# Patient Record
Sex: Male | Born: 1960 | Race: Black or African American | Hispanic: No | Marital: Single | Smoking: Former smoker
Health system: Southern US, Community
[De-identification: ages and names within clinical notes are randomized; demographics above are authoritative.]

## PROBLEM LIST (undated history)

## (undated) DIAGNOSIS — I2510 Atherosclerotic heart disease of native coronary artery without angina pectoris: Secondary | ICD-10-CM

## (undated) DIAGNOSIS — I639 Cerebral infarction, unspecified: Secondary | ICD-10-CM

## (undated) DIAGNOSIS — I10 Essential (primary) hypertension: Secondary | ICD-10-CM

## (undated) DIAGNOSIS — E119 Type 2 diabetes mellitus without complications: Secondary | ICD-10-CM

## (undated) DIAGNOSIS — I252 Old myocardial infarction: Secondary | ICD-10-CM

## (undated) DIAGNOSIS — I509 Heart failure, unspecified: Secondary | ICD-10-CM

## (undated) HISTORY — PX: OTHER SURGICAL HISTORY: SHX169

---
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
## 2018-02-06 ENCOUNTER — Emergency Department (HOSPITAL_COMMUNITY): Payer: Medicaid - Out of State

## 2018-02-06 ENCOUNTER — Emergency Department (HOSPITAL_COMMUNITY)
Admission: EM | Admit: 2018-02-06 | Discharge: 2018-02-06 | Disposition: A | Discharge status: Home / Self Care | Payer: Medicaid - Out of State | Attending: Emergency Medicine | Admitting: Emergency Medicine

## 2018-02-06 ENCOUNTER — Encounter (HOSPITAL_COMMUNITY): Payer: Self-pay | Admitting: Emergency Medicine

## 2018-02-06 DIAGNOSIS — I2510 Atherosclerotic heart disease of native coronary artery without angina pectoris: Secondary | ICD-10-CM | POA: Insufficient documentation

## 2018-02-06 DIAGNOSIS — I110 Hypertensive heart disease with heart failure: Secondary | ICD-10-CM | POA: Insufficient documentation

## 2018-02-06 DIAGNOSIS — Z9119 Patient's noncompliance with other medical treatment and regimen: Secondary | ICD-10-CM | POA: Insufficient documentation

## 2018-02-06 DIAGNOSIS — Z87891 Personal history of nicotine dependence: Secondary | ICD-10-CM | POA: Insufficient documentation

## 2018-02-06 DIAGNOSIS — I509 Heart failure, unspecified: Secondary | ICD-10-CM | POA: Insufficient documentation

## 2018-02-06 DIAGNOSIS — Z8673 Personal history of transient ischemic attack (TIA), and cerebral infarction without residual deficits: Secondary | ICD-10-CM | POA: Insufficient documentation

## 2018-02-06 DIAGNOSIS — R0789 Other chest pain: Secondary | ICD-10-CM | POA: Insufficient documentation

## 2018-02-06 DIAGNOSIS — Z760 Encounter for issue of repeat prescription: Secondary | ICD-10-CM | POA: Insufficient documentation

## 2018-02-06 DIAGNOSIS — I10 Essential (primary) hypertension: Secondary | ICD-10-CM

## 2018-02-06 HISTORY — DX: Essential (primary) hypertension: I10

## 2018-02-06 HISTORY — DX: Cerebral infarction, unspecified: I63.9

## 2018-02-06 HISTORY — DX: Heart failure, unspecified: I50.9

## 2018-02-06 HISTORY — DX: Type 2 diabetes mellitus without complications: E11.9

## 2018-02-06 HISTORY — DX: Atherosclerotic heart disease of native coronary artery without angina pectoris: I25.10

## 2018-02-06 LAB — CBC
HCT: 48.1 % (ref 39.0–52.0)
Hemoglobin: 15.4 g/dL (ref 13.0–17.0)
MCH: 27.9 pg (ref 26.0–34.0)
MCHC: 32 g/dL (ref 30.0–36.0)
MCV: 87.1 fL (ref 78.0–100.0)
PLATELETS: 335 10*3/uL (ref 150–400)
RBC: 5.52 MIL/uL (ref 4.22–5.81)
RDW: 13.6 % (ref 11.5–15.5)
WBC: 7 10*3/uL (ref 4.0–10.5)

## 2018-02-06 LAB — BASIC METABOLIC PANEL
Anion gap: 10 (ref 5–15)
BUN: 14 mg/dL (ref 6–20)
CALCIUM: 8.7 mg/dL — AB (ref 8.9–10.3)
CHLORIDE: 105 mmol/L (ref 98–111)
CO2: 27 mmol/L (ref 22–32)
CREATININE: 1.5 mg/dL — AB (ref 0.61–1.24)
GFR calc non Af Amer: 51 mL/min — ABNORMAL LOW (ref >60)
GFR, EST AFRICAN AMERICAN: 59 mL/min — AB (ref >60)
Glucose, Bld: 128 mg/dL — ABNORMAL HIGH (ref 70–99)
Potassium: 4.7 mmol/L (ref 3.5–5.1)
SODIUM: 142 mmol/L (ref 135–145)

## 2018-02-06 LAB — I-STAT TROPONIN, ED
TROPONIN I, POC: 0.01 ng/mL (ref 0.00–0.08)
Troponin i, poc: 0.01 ng/mL (ref 0.00–0.08)

## 2018-02-06 MED ORDER — METFORMIN HCL 500 MG PO TABS: 500.0000 mg | ORAL_TABLET | Freq: Two times a day (BID) | ORAL | 0 refills | Status: DC

## 2018-02-06 MED ORDER — SACUBITRIL-VALSARTAN 97-103 MG PO TABS: 1.0000 | ORAL_TABLET | Freq: Two times a day (BID) | ORAL | 0 refills | Status: DC

## 2018-02-06 MED ORDER — GABAPENTIN 100 MG PO CAPS: 100.0000 mg | ORAL_CAPSULE | Freq: Three times a day (TID) | ORAL | 0 refills | Status: DC

## 2018-02-06 MED ORDER — LOPERAMIDE HCL 2 MG PO CAPS: 2.0000 mg | ORAL_CAPSULE | ORAL | 0 refills | Status: DC | PRN

## 2018-02-06 MED ORDER — CARVEDILOL 25 MG PO TABS: 25.0000 mg | ORAL_TABLET | Freq: Two times a day (BID) | ORAL | 0 refills | Status: DC

## 2018-02-06 MED ORDER — ATORVASTATIN CALCIUM 40 MG PO TABS: 40.0000 mg | ORAL_TABLET | Freq: Every day | ORAL | 0 refills | Status: DC

## 2018-02-06 NOTE — ED Notes (Signed)
Pt transported to Xray. 

## 2018-02-06 NOTE — Discharge Instructions (Addendum)
Your evaluated in the emergency department for being out of your meds and having some chest pain.  You had blood work EKG chest x-ray that did not show an obvious cause of your pain.  We are prescribing you 1 month of your medications that you ran out of.  Will be important for you to establish a primary care doctor for further medications.  Please return if any worsening symptoms.

## 2018-02-06 NOTE — ED Triage Notes (Signed)
Pt recently moved to [place], has not had home meds refilled since moving here. No PCP here. Pt complains of being out of meds for HTN, CHF, DM, GERD. Complains of chest pressure. EMS gave 2 nitro. Initial BP 230/130. Recent BP 173/128, HR 110 sat 98% on room air. Pt informed EMS he had a stroke 2 months ago prior to moving to , weakness to right arm. Pt comes with complete med list.

## 2018-02-06 NOTE — ED Notes (Signed)
Gave pt taxi voucher to get home

## 2018-02-06 NOTE — ED Notes (Signed)
Gave pt two [place] sandwiches and diet sprite

## 2018-02-06 NOTE — ED Notes (Signed)
Pt took 324mg  asa at home prior to arrival

## 2018-02-06 NOTE — ED Provider Notes (Signed)
[facility] EMERGENCY DEPARTMENT Provider Note   CSN: [id number] Arrival date & time: 02/06/18  1133     History   Chief Complaint Chief Complaint  Patient presents with  . Medication Refill  . Chest Pain    HPI Thomas West is a 57 y.o. male.  He presents to the emergency department complaining of being out of his regular meds for the last 3 days.  He recently moved here from [facility] and has multiple medical problems.  Since being off his medicine he notices blood pressures been high and has been having intermittent chest pressure.  He does have a history of cardiomyopathy.  He is also had a stroke.  He received some nitro by EMS and currently denies any chest pain.  He also has a history of diabetes and his sugars been elevated.  The history is provided by the patient.  Medication Refill  Reason for request:  Medications ran out Medications taken before: yes - see home medications   Chest Pain   This is a recurrent problem. The current episode started 1 to 2 hours ago. The problem occurs constantly. The problem has been resolved. The pain is present in the substernal region. The pain is moderate. The quality of the pain is described as burning and pressure-like. The pain does not radiate. Pertinent negatives include no abdominal pain, no cough, no diaphoresis, no fever, no headaches, no hemoptysis, no leg pain, no nausea, no shortness of breath and no vomiting. Back pain: 1.    Past Medical History:  Diagnosis Date  . CHF (congestive heart failure) (HCC)   . Coronary artery disease   . Diabetes mellitus without complication (HCC)   . Hypertension   . Stroke [facility])     There are no active problems to display for this patient.   Past Surgical History:  Procedure Laterality Date  . leg surgery     "pins in left shin"        Home Medications    Prior to Admission medications   Not on File    Family History History reviewed. No pertinent  family history.  Social History Social History   Tobacco Use  . Smoking status: Former Smoker    Last attempt to quit: 10/06/2017    Years since quitting: 0.3  . Smokeless tobacco: Never Used  Substance Use Topics  . Alcohol use: Not Currently  . Drug use: Yes    Types: Marijuana     Allergies   Bee venom   Review of Systems Review of Systems  Constitutional: Negative for diaphoresis and fever.  HENT: Negative for sore throat.   Eyes: Negative for visual disturbance.  Respiratory: Negative for cough, hemoptysis and shortness of breath.   Cardiovascular: Positive for chest pain.  Gastrointestinal: Negative for abdominal pain, nausea and vomiting.  Genitourinary: Negative for dysuria.  Musculoskeletal: Negative for neck pain. Back pain: 1.  Skin: Negative for rash.  Neurological: Negative for headaches.     Physical Exam Updated Vital Signs BP (!) 194/141 (BP Location: Right Arm)   Pulse 95   Temp 98.4 F (36.9 C) (Oral)   Resp 19   Ht 5\' 9"  (1.753 m)   Wt 77.1 kg   SpO2 92%   BMI 25.10 kg/m   Physical Exam  Constitutional: He appears well-developed and well-nourished.  HENT:  Head: Normocephalic and atraumatic.  Eyes: Conjunctivae are normal.  Neck: Neck supple.  Cardiovascular: Normal rate, regular rhythm and normal  pulses.  No murmur heard. Pulmonary/Chest: Effort normal and breath sounds normal. No respiratory distress.  Abdominal: Soft. There is no tenderness.  Musculoskeletal: He exhibits no edema.       Right lower leg: He exhibits no tenderness and no edema.       Left lower leg: He exhibits no tenderness and no edema.  Neurological: He is alert.  Skin: Skin is warm and dry.  Psychiatric: He has a normal mood and affect.  Nursing note and vitals reviewed.    ED Treatments / Results  Labs (all labs ordered are listed, but only abnormal results are displayed) Labs Reviewed  BASIC METABOLIC PANEL - Abnormal; Notable for the following  components:      Result Value   Glucose, Bld 128 (*)    Creatinine, Ser 1.50 (*)    Calcium 8.7 (*)    GFR calc non Af Amer 51 (*)    GFR calc Af Amer 59 (*)    All other components within normal limits  CBC  I-STAT TROPONIN, ED  I-STAT TROPONIN, ED    EKG EKG Interpretation  Date/Time:  Monday February 06 2018 11:37:38 EDT Ventricular Rate:  93 PR Interval:    QRS Duration: 78 QT Interval:  359 QTC Calculation: 447 R Axis:   56 Text Interpretation:  Sinus rhythm Probable left atrial enlargement Probable left ventricular hypertrophy Abnormal T, consider ischemia, lateral leads no prior to compare with Confirmed by Meridee Score [phone]) on 02/06/2018 11:41:40 AM   Radiology Dg Chest 2 View  Result Date: 02/06/2018 CLINICAL DATA:  Chest pain. EXAM: CHEST - 2 VIEW COMPARISON:  None. FINDINGS: The heart size and mediastinal contours are within normal limits. Both lungs are clear. No pneumothorax or pleural effusion is noted. The visualized skeletal structures are unremarkable. IMPRESSION: No active cardiopulmonary disease. Electronically Signed   By: Lupita Raider, M.D.   On: 02/06/2018 12:35    Procedures Procedures (including critical care time)  Medications Ordered in ED Medications - No data to display   Initial Impression / Assessment and Plan / ED Course  I have reviewed the triage vital signs and the nursing notes.  Pertinent labs & imaging results that were available during my care of the patient were reviewed by me and considered in my medical decision making (see chart for details).  Clinical Course as of Feb 07 1403  Mon Feb 06, 2018  [address] 57 year old male with multiple medical problems now out of his medications and here with elevated blood pressure chest pain and elevated blood sugars.  Regarding some basic labs EKG chest x-ray.  Is pain-free now so I think he possibly can be discharged if we do not find any catastrophes in his blood work.   [MB]      Clinical Course User Index [MB] Terrilee Files, MD     Final Clinical Impressions(s) / ED Diagnoses   Final diagnoses:  Atypical chest pain  Essential hypertension    ED Discharge Orders         Ordered    atorvastatin (LIPITOR) 40 MG tablet  Daily at bedtime     02/06/18 1505    carvedilol (COREG) 25 MG tablet  Every 12 hours     02/06/18 1505    gabapentin (NEURONTIN) 100 MG capsule  3 times daily     02/06/18 1505    metFORMIN (GLUCOPHAGE) 500 MG tablet  2 times daily with meals     02/06/18 1505  sacubitril-valsartan (ENTRESTO) 97-103 MG  Every 12 hours     02/06/18 1505    loperamide (IMODIUM) 2 MG capsule  As needed     02/06/18 1538           Terrilee FilesButler, Karnisha Lefebre C, MD 02/07/18 [phone]

## 2019-03-05 ENCOUNTER — Other Ambulatory Visit: Payer: Self-pay

## 2019-03-05 ENCOUNTER — Ambulatory Visit (HOSPITAL_COMMUNITY)
Admission: EM | Admit: 2019-03-05 | Discharge: 2019-03-05 | Disposition: A | Discharge status: Home / Self Care | Payer: Medicare Other | Attending: Family Medicine | Admitting: Family Medicine

## 2019-03-05 ENCOUNTER — Encounter (HOSPITAL_COMMUNITY): Payer: Self-pay | Admitting: Emergency Medicine

## 2019-03-05 DIAGNOSIS — K219 Gastro-esophageal reflux disease without esophagitis: Secondary | ICD-10-CM

## 2019-03-05 DIAGNOSIS — N182 Chronic kidney disease, stage 2 (mild): Secondary | ICD-10-CM | POA: Diagnosis not present

## 2019-03-05 DIAGNOSIS — Z760 Encounter for issue of repeat prescription: Secondary | ICD-10-CM

## 2019-03-05 DIAGNOSIS — I252 Old myocardial infarction: Secondary | ICD-10-CM

## 2019-03-05 DIAGNOSIS — E785 Hyperlipidemia, unspecified: Secondary | ICD-10-CM

## 2019-03-05 DIAGNOSIS — I2510 Atherosclerotic heart disease of native coronary artery without angina pectoris: Secondary | ICD-10-CM

## 2019-03-05 DIAGNOSIS — E1140 Type 2 diabetes mellitus with diabetic neuropathy, unspecified: Secondary | ICD-10-CM

## 2019-03-05 DIAGNOSIS — I10 Essential (primary) hypertension: Secondary | ICD-10-CM

## 2019-03-05 HISTORY — DX: Old myocardial infarction: I25.2

## 2019-03-05 MED ORDER — CLOPIDOGREL BISULFATE 75 MG PO TABS: 75.0000 mg | ORAL_TABLET | Freq: Every day | ORAL | 0 refills | Status: DC

## 2019-03-05 MED ORDER — CARVEDILOL 25 MG PO TABS: 25.0000 mg | ORAL_TABLET | Freq: Two times a day (BID) | ORAL | 0 refills | Status: DC

## 2019-03-05 MED ORDER — ASPIRIN EC 81 MG PO TBEC: 81.0000 mg | DELAYED_RELEASE_TABLET | Freq: Every day | ORAL | 0 refills | Status: DC

## 2019-03-05 MED ORDER — ENTRESTO 24-26 MG PO TABS: 1.0000 | ORAL_TABLET | Freq: Two times a day (BID) | ORAL | 0 refills | Status: DC

## 2019-03-05 MED ORDER — GLIPIZIDE ER 10 MG PO TB24: 10.0000 mg | ORAL_TABLET | Freq: Every day | ORAL | 0 refills | Status: DC

## 2019-03-05 MED ORDER — HYDRALAZINE HCL 50 MG PO TABS: 50.0000 mg | ORAL_TABLET | Freq: Three times a day (TID) | ORAL | 0 refills | Status: DC

## 2019-03-05 MED ORDER — NITROGLYCERIN 0.3 MG SL SUBL: 0.3000 mg | SUBLINGUAL_TABLET | SUBLINGUAL | 0 refills | Status: DC | PRN

## 2019-03-05 MED ORDER — ATORVASTATIN CALCIUM 40 MG PO TABS: 40.0000 mg | ORAL_TABLET | Freq: Every day | ORAL | 0 refills | Status: DC

## 2019-03-05 MED ORDER — ISOSORBIDE MONONITRATE ER 60 MG PO TB24: 60.0000 mg | ORAL_TABLET | Freq: Every day | ORAL | 0 refills | Status: DC

## 2019-03-05 MED ORDER — METFORMIN HCL 500 MG PO TABS: 500.0000 mg | ORAL_TABLET | Freq: Two times a day (BID) | ORAL | 0 refills | Status: DC

## 2019-03-05 MED ORDER — METOPROLOL SUCCINATE ER 100 MG PO TB24: 100.0000 mg | ORAL_TABLET | Freq: Every day | ORAL | 0 refills | Status: DC

## 2019-03-05 MED ORDER — GABAPENTIN 100 MG PO CAPS: 100.0000 mg | ORAL_CAPSULE | Freq: Three times a day (TID) | ORAL | 0 refills | Status: DC

## 2019-03-05 NOTE — ED Provider Notes (Signed)
[facility]    CSN: [id number] Arrival date & time: 03/05/19  1550      History   Chief Complaint Chief Complaint  Patient presents with  . Medication Refill    HPI Thomas West is a 58 y.o. male.   HPI  Patient states that he moved to [place] a week and a half ago.  He needs refills of all of his medications.  He brings with him records from a rehabilitation stay after he fell down some stairs and broke his shoulder.  He was discharged in July.  Looks like his medicine should have lasted until 02/13/2019.  He states he is out of everything.  Blood pressure is high today.  He feels well except for shoulder pain.  He is specifically requesting a refill of his pain medication.  I informed him that I would not refill chronic Percocet.  Recommend he take Tylenol for pain.  I am not comfortable giving him anti-inflammatory medications because he has a history of kidney failure and I do not have any lab work  Past Medical History:  Diagnosis Date  . CHF (congestive heart failure) (HCC)   . Coronary artery disease   . Diabetes mellitus without complication (HCC)   . History of MI (myocardial infarction) 03/05/2019  . Hypertension   . Stroke [facility])     Patient Active Problem List   Diagnosis Date Noted  . CAD (coronary artery disease) 03/05/2019  . History of MI (myocardial infarction) 03/05/2019  . Type 2 diabetes mellitus with diabetic neuropathy, unspecified (HCC) 03/05/2019  . CKD (chronic kidney disease), stage II 03/05/2019  . HLD (hyperlipidemia) 03/05/2019  . GERD (gastroesophageal reflux disease) 03/05/2019  . Essential hypertension 03/05/2019    Past Surgical History:  Procedure Laterality Date  . leg surgery     "pins in left shin"       Home Medications    Prior to Admission medications   Medication Sig Start Date End Date Taking? Authorizing Provider  omeprazole (PRILOSEC) 20 MG capsule Take 20 mg by mouth daily.   Yes [provider]  oxyCODONE-acetaminophen (PERCOCET/ROXICET) 5-325 MG tablet Take by mouth 2 (two) times daily as needed for severe pain.   Yes [provider]  simethicone (MYLICON) 125 MG chewable tablet Chew 125 mg by mouth every 6 (six) hours as needed for flatulence.   Yes [provider]  aspirin EC 81 MG tablet Take 1 tablet (81 mg total) by mouth daily. 03/05/19   Eustace Moore, MD  atorvastatin (LIPITOR) 40 MG tablet Take 1 tablet (40 mg total) by mouth at bedtime. 03/05/19   Eustace Moore, MD  carvedilol (COREG) 25 MG tablet Take 1 tablet (25 mg total) by mouth every 12 (twelve) hours. 10am and 10pm. 03/05/19   Eustace Moore, MD  clopidogrel (PLAVIX) 75 MG tablet Take 1 tablet (75 mg total) by mouth daily. 03/05/19   Eustace Moore, MD  gabapentin (NEURONTIN) 100 MG capsule Take 1 capsule (100 mg total) by mouth 3 (three) times daily. 03/05/19   Eustace Moore, MD  glipiZIDE (GLUCOTROL XL) 10 MG 24 hr tablet Take 1 tablet (10 mg total) by mouth daily with breakfast. 03/05/19   Eustace Moore, MD  hydrALAZINE (APRESOLINE) 50 MG tablet Take 1 tablet (50 mg total) by mouth 3 (three) times daily. 03/05/19   Eustace Moore, MD  isosorbide mononitrate (IMDUR) 60 MG 24 hr tablet Take 1 tablet (60 mg total) by  mouth daily. 03/05/19   Raylene Everts, MD  loperamide (IMODIUM) 2 MG capsule Take 1 capsule (2 mg total) by mouth as needed for diarrhea or loose stools. 02/06/18   Hayden Rasmussen, MD  metFORMIN (GLUCOPHAGE) 500 MG tablet Take 1 tablet (500 mg total) by mouth 2 (two) times daily with a meal. 03/05/19   Raylene Everts, MD  metoprolol succinate (TOPROL-XL) 100 MG 24 hr tablet Take 1 tablet (100 mg total) by mouth daily. Take with or immediately following a meal. 03/05/19   Raylene Everts, MD  nitroGLYCERIN (NITROSTAT) 0.3 MG SL tablet Place 1 tablet (0.3 mg total) under the tongue every 5 (five) minutes as needed for chest pain. 03/05/19   Raylene Everts, MD  sacubitril-valsartan (ENTRESTO) 24-26 MG Take 1 tablet by mouth 2 (two) times daily. 03/05/19   Raylene Everts, MD    Family History History reviewed. No pertinent family history.  Social History Social History   Tobacco Use  . Smoking status: Former Smoker    Quit date: 10/06/2017    Years since quitting: 1.4  . Smokeless tobacco: Never Used  Substance Use Topics  . Alcohol use: Not Currently  . Drug use: Yes    Types: Marijuana     Allergies   Bee venom   Review of Systems Review of Systems  Constitutional: Negative for chills and fever.  HENT: Negative for ear pain and sore throat.   Eyes: Negative for pain and visual disturbance.  Respiratory: Negative for cough and shortness of breath.   Cardiovascular: Negative for chest pain and palpitations.  Gastrointestinal: Negative for abdominal pain and vomiting.  Genitourinary: Negative for dysuria and hematuria.  Musculoskeletal: Positive for arthralgias. Negative for back pain.  Skin: Negative for color change and rash.  Neurological: Negative for seizures and syncope.  All other systems reviewed and are negative.    Physical Exam Triage Vital Signs ED Triage Vitals [03/05/19 1639]  Enc Vitals Group     BP (!) 197/108     Pulse Rate 89     Resp 18     Temp 98.6 F (37 C)     Temp Source Oral     SpO2 95 %     Weight      Height      Head Circumference      Peak Flow      Pain Score 7     Pain Loc      Pain Edu?      Excl. in [place]?    No data found.  Updated Vital Signs BP (!) 197/108 (BP Location: Right Arm)   Pulse 89   Temp 98.6 F (37 C) (Oral)   Resp 18   SpO2 95%       Physical Exam Constitutional:      General: He is in acute distress.     Appearance: He is well-developed.     Comments: Holding right shoulder.  Rocking back and forth  HENT:     Head: Normocephalic and atraumatic.  Eyes:     Conjunctiva/sclera: Conjunctivae normal.     Pupils: Pupils are equal,  round, and reactive to light.  Neck:     Musculoskeletal: Normal range of motion.  Cardiovascular:     Rate and Rhythm: Normal rate and regular rhythm.     Heart sounds: Normal heart sounds.  Pulmonary:     Effort: Pulmonary effort is normal. No respiratory distress.  Breath sounds: Normal breath sounds. No rales.  Abdominal:     General: There is no distension.     Palpations: Abdomen is soft.  Musculoskeletal: Normal range of motion.  Skin:    General: Skin is warm and dry.  Neurological:     Mental Status: He is alert.  Psychiatric:        Mood and Affect: Mood normal.        Behavior: Behavior normal.      UC Treatments / Results  Labs (all labs ordered are listed, but only abnormal results are displayed) Labs Reviewed - No data to display  EKG   Radiology No results found.  Procedures Procedures (including critical care time)  Medications Ordered in UC Medications - No data to display  Initial Impression / Assessment and Plan / UC Course  I have reviewed the triage vital signs and the nursing notes.  Pertinent labs & imaging results that were available during my care of the patient were reviewed by me and considered in my medical decision making (see chart for details).     I explained to the patient with his multitude of medical problems it was really important that he go see a primary care doctor for additional care.  I only gave him 30 days of medicine.  Told him he needs to follow-up within the next 30 days.  He needs referrals for his pain in his shoulder and for cardiology. Final Clinical Impressions(s) / UC Diagnoses   Final diagnoses:  CKD (chronic kidney disease), stage II  Coronary artery disease involving native heart, angina presence unspecified, unspecified vessel or lesion type  Essential hypertension  Hyperlipidemia, unspecified hyperlipidemia type  Medication refill     Discharge Instructions     All of your necessary medicines  have been refilled We do not refill pain medication.  You must take extra strength Tylenol for your shoulder pain You need to see a primary care doctor soon as possible.  Call them tomorrow for an appointment within 30 days You only have 30 days of medication.  We do not do ongoing refills of chronic medications.  It is important that you see a primary care doctor. Your primary care doctor can refer you to an orthopedic for your shoulder pain and to a cardiologist for ongoing heart care Some of your medicines are over-the-counter.  You can get these without a prescription.  (Mylicon, Lomotil, omeprazole)   ED Prescriptions    Medication Sig Dispense Auth. Provider   aspirin EC 81 MG tablet Take 1 tablet (81 mg total) by mouth daily. 30 tablet Eustace Moore, MD   atorvastatin (LIPITOR) 40 MG tablet  (Status: Discontinued) Take 1 tablet (40 mg total) by mouth at bedtime. 30 tablet Eustace Moore, MD   carvedilol (COREG) 25 MG tablet  (Status: Discontinued) Take 1 tablet (25 mg total) by mouth every 12 (twelve) hours. 10am and 10pm. 60 tablet Eustace Moore, MD   clopidogrel (PLAVIX) 75 MG tablet Take 1 tablet (75 mg total) by mouth daily. 30 tablet Eustace Moore, MD   gabapentin (NEURONTIN) 100 MG capsule Take 1 capsule (100 mg total) by mouth 3 (three) times daily. 90 capsule Eustace Moore, MD   glipiZIDE (GLUCOTROL XL) 10 MG 24 hr tablet Take 1 tablet (10 mg total) by mouth daily with breakfast. 30 tablet Eustace Moore, MD   carvedilol (COREG) 25 MG tablet Take 1 tablet (25 mg total) by mouth every 12 (twelve)  hours. 10am and 10pm. 60 tablet Eustace MooreNelson, Teona Vargus Sue, MD   hydrALAZINE (APRESOLINE) 50 MG tablet Take 1 tablet (50 mg total) by mouth 3 (three) times daily. 90 tablet Eustace MooreNelson, Paola Aleshire Sue, MD   isosorbide mononitrate (IMDUR) 60 MG 24 hr tablet Take 1 tablet (60 mg total) by mouth daily. 30 tablet Eustace MooreNelson, Burna Atlas Sue, MD   metFORMIN (GLUCOPHAGE) 500 MG tablet Take 1  tablet (500 mg total) by mouth 2 (two) times daily with a meal. 60 tablet Eustace MooreNelson, Mettie Roylance Sue, MD   metoprolol succinate (TOPROL-XL) 100 MG 24 hr tablet Take 1 tablet (100 mg total) by mouth daily. Take with or immediately following a meal. 30 tablet Eustace MooreNelson, Jhamari Markowicz Sue, MD   nitroGLYCERIN (NITROSTAT) 0.3 MG SL tablet Place 1 tablet (0.3 mg total) under the tongue every 5 (five) minutes as needed for chest pain. 20 tablet Eustace MooreNelson, Junior Kenedy Sue, MD   sacubitril-valsartan (ENTRESTO) 24-26 MG Take 1 tablet by mouth 2 (two) times daily. 60 tablet Eustace MooreNelson, Toleen Lachapelle Sue, MD   atorvastatin (LIPITOR) 40 MG tablet Take 1 tablet (40 mg total) by mouth at bedtime. 30 tablet Eustace MooreNelson, Zeffie Bickert Sue, MD     PDMP not reviewed this encounter.   Eustace MooreNelson, Ashlyne Olenick Sue, MD 03/05/19 [phone]

## 2019-03-05 NOTE — Discharge Instructions (Signed)
All of your necessary medicines have been refilled We do not refill pain medication.  You must take extra strength Tylenol for your shoulder pain You need to see a primary care doctor soon as possible.  Call them tomorrow for an appointment within 30 days You only have 30 days of medication.  We do not do ongoing refills of chronic medications.  It is important that you see a primary care doctor. Your primary care doctor can refer you to an orthopedic for your shoulder pain and to a cardiologist for ongoing heart care Some of your medicines are over-the-counter.  You can get these without a prescription.  (Mylicon, Lomotil, omeprazole)

## 2019-03-05 NOTE — ED Triage Notes (Signed)
Pt here for refill on medications; pt sts moved here from [place] recently and has been out of all meds x 1 week

## 2019-03-27 ENCOUNTER — Ambulatory Visit (INDEPENDENT_AMBULATORY_CARE_PROVIDER_SITE_OTHER): Payer: Medicare Other | Admitting: Family Medicine

## 2019-03-27 ENCOUNTER — Encounter: Payer: Self-pay | Admitting: Family Medicine

## 2019-03-27 ENCOUNTER — Other Ambulatory Visit: Payer: Self-pay

## 2019-03-27 VITALS — BP 150/80 | HR 85 | Temp 97.8°F | Resp 16 | Ht 69.0 in | Wt 175.8 lb

## 2019-03-27 DIAGNOSIS — E785 Hyperlipidemia, unspecified: Secondary | ICD-10-CM

## 2019-03-27 DIAGNOSIS — E1169 Type 2 diabetes mellitus with other specified complication: Secondary | ICD-10-CM | POA: Insufficient documentation

## 2019-03-27 DIAGNOSIS — I2510 Atherosclerotic heart disease of native coronary artery without angina pectoris: Secondary | ICD-10-CM | POA: Diagnosis not present

## 2019-03-27 DIAGNOSIS — I10 Essential (primary) hypertension: Secondary | ICD-10-CM

## 2019-03-27 DIAGNOSIS — M25511 Pain in right shoulder: Secondary | ICD-10-CM | POA: Diagnosis not present

## 2019-03-27 DIAGNOSIS — E119 Type 2 diabetes mellitus without complications: Secondary | ICD-10-CM | POA: Insufficient documentation

## 2019-03-27 MED ORDER — METOPROLOL SUCCINATE ER 100 MG PO TB24: 100.0000 mg | ORAL_TABLET | Freq: Every day | ORAL | 0 refills | Status: DC

## 2019-03-27 MED ORDER — ISOSORBIDE MONONITRATE ER 60 MG PO TB24: 60.0000 mg | ORAL_TABLET | Freq: Every day | ORAL | 2 refills | Status: DC

## 2019-03-27 MED ORDER — ATORVASTATIN CALCIUM 40 MG PO TABS: 40.0000 mg | ORAL_TABLET | Freq: Every day | ORAL | 2 refills | Status: DC

## 2019-03-27 MED ORDER — CARVEDILOL 25 MG PO TABS: 25.0000 mg | ORAL_TABLET | Freq: Two times a day (BID) | ORAL | 2 refills | Status: DC

## 2019-03-27 MED ORDER — METOPROLOL SUCCINATE ER 100 MG PO TB24: 100.0000 mg | ORAL_TABLET | Freq: Every day | ORAL | 2 refills | Status: DC

## 2019-03-27 MED ORDER — CLOPIDOGREL BISULFATE 75 MG PO TABS: 75.0000 mg | ORAL_TABLET | Freq: Every day | ORAL | 2 refills | Status: DC

## 2019-03-27 MED ORDER — HYDRALAZINE HCL 50 MG PO TABS: 50.0000 mg | ORAL_TABLET | Freq: Three times a day (TID) | ORAL | 2 refills | Status: DC

## 2019-03-27 NOTE — Patient Instructions (Signed)
A few things to remember from today's visit:   Type 2 diabetes mellitus with other specified complication, without long-term current use of insulin ([place]) - Plan: Hemoglobin A1c, Fructosamine, Microalbumin / creatinine urine ratio  Hypertension, essential, benign - Plan: Comprehensive metabolic panel  Coronary artery disease involving native coronary artery of native heart, angina presence unspecified  I am not prescribing Percocet. If you decide to go to ortho please let me know. Continue monitoring blood pressure, it is supposed to be at least under 140/90.   This is a list of pain clinics/chronic pain management in the area.  -[facility] [phone].  -[facility] [phone].  -[facility] [place] [facility] [phone].  -[facility] [phone]  -[facility]  [phone]  Dr. Alysia Penna [phone] [place], MD [phone]  -[facility] [place], MD [place] [phone]  Suella Broad [phone].

## 2019-03-27 NOTE — Assessment & Plan Note (Signed)
Today he is asymptomatic. Continue Plavix 75 mg daily and Aspirin 81 mg. Taking atorvastatin 40 mg, carvedilol 25 mg twice daily, and Imdur 60 mg daily. Strongly recommend establishing with cardiologist in the area, he is not interested in referral.

## 2019-03-27 NOTE — Assessment & Plan Note (Signed)
Continue atorvastatin 40 mg daily. Recommend having lipid panel recheck during next appointment, today he is not fasting.

## 2019-03-27 NOTE — Assessment & Plan Note (Signed)
Today A1c was ordered. According to patient probably has been well controlled and medications were discontinued. We will hold on refilling medications until lab result is back. He has an appointment with dentist and eye care provider next week. Recommend continuing dietary recommendations.

## 2019-03-27 NOTE — Assessment & Plan Note (Signed)
Problem is poorly controlled. He has no interested in adjusting medication, he feels like BP today is adequate for him. Educated about possible complications of elevated BP. Continue monitoring BP. Low-salt diet recommended.

## 2019-03-27 NOTE — Progress Notes (Signed)
HPI:   Thomas West is a 58 y.o. male, who is here today to establish care.  [place]. Last preventive routine visit: Over a year ago.  Chronic medical problems: DM 2, hypertension,GERD, CAD,CVA,CHF, and former smoker.  BP is mildly elevated today. He is not interested in discussing hypertension, he states that BP today is "good for me" because it is usually in the 200s/100s. He checks BP regularly at home.  Currently he is on metoprolol succinate 100 mg daily, carvedilol 25 mg twice daily, hydralazine 50 mg 3 times daily, and Imdur 60 mg daily. He is on Plavix 75 mg daily and Aspirin 81 mg daily.  He denies unusual headache, chest pain, dyspnea, palpitations, focal neurologic deficit, or edema. Early 01/2019 he had coronary stent placement, later same month he underwent CABG.  Lab Results  Component Value Date   CREATININE 1.50 (H) 02/06/2018   BUN 14 02/06/2018   NA 142 02/06/2018   K 4.7 02/06/2018   CL 105 02/06/2018   CO2 27 02/06/2018     DM2, currently he is on Metformin 500 mg twice daily and Glucotrol XL 10 mg daily. According to patient, his last A1c was done over 6 months ago and he was recommended to continue nonpharmacologic treatment but he decided to continue medications. He is not checking BS at home. Denies abdominal pain, nausea,vomiting, polydipsia,polyuria, or polyphagia.  Hyperlipidemia: Currently he is on atorvastatin 40 mg daily. Tolerating medication well.  Concerns today: Requesting a refill for Percocet. He states that in 12/2018 he fell at home when he was chasing his cat, injured right shoulder pain. [place] to [place] shoulder [place], PT was recommended but he did not complete it because he relocated.   He states that acetaminophen does not help and he cannot take OTC NSAIDs because his heart condition.  Pain is severe, constant, interfering with  his sleep. Limitation of right shoulder range of motion. Exacerbated by lying on his right side and minimal movement. Alleviated by rest. He has not noted shoulder erythema or edema. Pain is not getting any better.  Review of Systems  Constitutional: Positive for fatigue. Negative for appetite change, chills, fever and unexpected weight change.  HENT: Negative for mouth sores, nosebleeds and sore throat.   Eyes: Negative for redness and visual disturbance.  Respiratory: Negative for cough and wheezing.   Gastrointestinal:       No changes in bowel habits.  Endocrine: Negative for cold intolerance and heat intolerance.  Genitourinary: Negative for decreased urine volume, dysuria and hematuria.  Musculoskeletal: Negative for gait problem.  Skin: Negative for rash and wound.  Allergic/Immunologic: Negative for environmental allergies.  Neurological: Negative for syncope, facial asymmetry and numbness.  Psychiatric/Behavioral: Negative for confusion. The patient is nervous/anxious.   Rest see pertinent positives and negatives per HPI.   Current Outpatient Medications on File Prior to Visit  Medication Sig Dispense Refill  . aspirin EC 81 MG tablet Take 1 tablet (81 mg total) by mouth daily. 30 tablet 0  . gabapentin (NEURONTIN) 100 MG capsule Take 1 capsule (100 mg total) by mouth 3 (three) times daily. 90 capsule 0  . glipiZIDE (GLUCOTROL XL) 10 MG 24 hr tablet Take 1 tablet (10 mg total) by mouth daily with breakfast. 30 tablet 0  . loperamide (IMODIUM) 2 MG capsule Take 1 capsule (2 mg total) by mouth  as needed for diarrhea or loose stools. 30 capsule 0  . metFORMIN (GLUCOPHAGE) 500 MG tablet Take 1 tablet (500 mg total) by mouth 2 (two) times daily with a meal. 60 tablet 0  . nitroGLYCERIN (NITROSTAT) 0.3 MG SL tablet Place 1 tablet (0.3 mg total) under the tongue every 5 (five) minutes as needed for chest pain. 20 tablet 0  . omeprazole (PRILOSEC) 20 MG capsule Take 20 mg by mouth  daily.    Marland Kitchen oxyCODONE-acetaminophen (PERCOCET/ROXICET) 5-325 MG tablet Take by mouth 2 (two) times daily as needed for severe pain.    . sacubitril-valsartan (ENTRESTO) 24-26 MG Take 1 tablet by mouth 2 (two) times daily. 60 tablet 0  . simethicone (MYLICON) 125 MG chewable tablet Chew 125 mg by mouth every 6 (six) hours as needed for flatulence.     No current facility-administered medications on file prior to visit.      Past Medical History:  Diagnosis Date  . CHF (congestive heart failure) (HCC)   . Coronary artery disease   . Diabetes mellitus without complication (HCC)   . History of MI (myocardial infarction) 03/05/2019  . Hypertension   . Stroke [facility])    Allergies  Allergen Reactions  . Bee Venom     swelling    History reviewed. No pertinent family history.  Social History   Socioeconomic History  . Marital status: Single    Spouse name: Not on file  . Number of children: Not on file  . Years of education: Not on file  . Highest education level: Not on file  Occupational History  . Not on file  Social Needs  . Financial resource strain: Not on file  . Food insecurity    Worry: Not on file    Inability: Not on file  . Transportation needs    Medical: Not on file    Non-medical: Not on file  Tobacco Use  . Smoking status: Former Smoker    Quit date: 10/06/2017    Years since quitting: 1.4  . Smokeless tobacco: Never Used  Substance and Sexual Activity  . Alcohol use: Not Currently  . Drug use: Yes    Types: Marijuana  . Sexual activity: Not on file  Lifestyle  . Physical activity    Days per week: Not on file    Minutes per session: Not on file  . Stress: Not on file  Relationships  . Social Musician on phone: Not on file    Gets together: Not on file    Attends religious service: Not on file    Active member of club or organization: Not on file    Attends meetings of clubs or organizations: Not on file    Relationship status: Not on  file  Other Topics Concern  . Not on file  Social History Narrative  . Not on file    Vitals:   03/27/19 1142  BP: (!) 150/80  Pulse: 85  Resp: 16  Temp: 97.8 F (36.6 C)  SpO2: 96%    Body mass index is 25.96 kg/m.  Physical Exam  Nursing note reviewed. Constitutional: He is oriented to person, place, and time. He appears well-developed and well-nourished. No distress.  HENT:  Head: Normocephalic and atraumatic.  Mouth/Throat: Oropharynx is clear and moist and mucous membranes are normal. Abnormal dentition (missing teeth.).  Eyes: Pupils are equal, round, and reactive to light. Conjunctivae are normal.  Cardiovascular: Normal rate and regular rhythm.  Murmur (soft  SEM RUSB) heard. Respiratory: Effort normal and breath sounds normal. No respiratory distress.  GI: Soft. He exhibits no mass. There is no hepatomegaly. There is no abdominal tenderness.  Musculoskeletal:        General: No edema.     Right shoulder: He exhibits decreased range of motion and tenderness. He exhibits no swelling.     Comments: Holding right shoulder during the whole visit. Pain with minimal movement and palpation.  Lymphadenopathy:    He has no cervical adenopathy.  Neurological: He is alert and oriented to person, place, and time. He has normal strength. No cranial nerve deficit. Gait normal.  Skin: Skin is warm. No rash noted. No erythema.  Psychiatric: His affect is blunt. He is aggressive.  Fairly groomed, poor eye contact.    ASSESSMENT AND PLAN:  Mr. Delance was seen today for establish care.  Diagnoses and all orders for this visit:  Orders Placed This Encounter  Procedures  . Comprehensive metabolic panel  . Hemoglobin A1c  . Fructosamine  . Microalbumin / creatinine urine ratio    Right shoulder pain, unspecified chronicity Persistent pain after injury in 12/2018, still having severe pain. Shoulder difficult to evaluate due to pain. When I recommend continue with  acetaminophen and topical diclofenac, he does not want to try other treatments "I want my Percocet." Very upset.  He tells me that Percocet was recommended by his former PCP and he will continue Percocet as recommended. Tried to educate patient about current guidelines in regard to chronic opioid use and pain management.  "I was told that PCP will prescribe Percocet", "I got friends that get Percocet from their doctor."  He is not interested in ortho evaluation. He wants to establish with a new PCP that prescribes Percocet. Explained that I am not sure who would feel comfortable doing so, so a list of chronic pain managers in this area was on AVS. Refused AVS , stated that he will not leave until he gets his Percocet. I called administrator to discuss pt's concerns.  Hyperlipidemia associated with type 2 diabetes mellitus (HCC) Continue atorvastatin 40 mg daily. Recommend having lipid panel recheck during next appointment, today he is not fasting.  Diabetes mellitus (HCC) Today A1c was ordered. According to patient probably has been well controlled and medications were discontinued. We will hold on refilling medications until lab result is back. He has an appointment with dentist and eye care provider next week. Recommend continuing dietary recommendations.   CAD (coronary artery disease) Today he is asymptomatic. Continue Plavix 75 mg daily and Aspirin 81 mg. Taking atorvastatin 40 mg, carvedilol 25 mg twice daily, and Imdur 60 mg daily. Strongly recommend establishing with cardiologist in the area, he is not interested in referral.  Hypertension, essential, benign Problem is poorly controlled. He has no interested in adjusting medication, he feels like BP today is adequate for him. Educated about possible complications of elevated BP. Continue monitoring BP. Low-salt diet recommended.   Return in about 3 months (around 06/27/2019) for new PCP.    Gahel Safley G. [name], MD   [facility]. [place] office.

## 2019-04-27 ENCOUNTER — Other Ambulatory Visit: Payer: Self-pay | Admitting: Family Medicine

## 2019-04-27 NOTE — Telephone Encounter (Signed)
Medication Refill - Medication: glipiZIDE (GLUCOTROL XL) 10 MG 24 hr tablet/metFORMIN (GLUCOPHAGE) 500 MG tablet    Has the patient contacted their pharmacy? Yes.   (Agent: If no, request that the patient contact the pharmacy for the refill.) (Agent: If yes, when and what did the pharmacy advise?)  Preferred Pharmacy (with phone number or street name): [place], [address] [phone] (Phone) [phone] (Fax)     Agent: Please be advised that RX refills may take up to 3 business days. We ask that you follow-up with your pharmacy.

## 2019-04-27 NOTE — Telephone Encounter (Signed)
Requested medication (s) are due for refill today: yes  Requested medication (s) are on the active medication list: yes  Last refill:  03/05/2019  Future visit scheduled: no  Notes to clinic:  Review for refill Last filled by different provider    Requested Prescriptions  Pending Prescriptions Disp Refills   glipiZIDE (GLUCOTROL XL) 10 MG 24 hr tablet 30 tablet 0    Sig: Take 1 tablet (10 mg total) by mouth daily with breakfast.     Endocrinology:  Diabetes - Sulfonylureas Failed - 04/27/2019 12:31 PM      Failed - HBA1C is between 0 and 7.9 and within 180 days    No results found for: HGBA1C       Passed - Valid encounter within last 6 months    Recent Outpatient Visits          1 month ago Type 2 diabetes mellitus with other specified complication, without long-term current use of insulin ([place])   [facility] [name], Malka So, MD              metFORMIN (GLUCOPHAGE) 500 MG tablet 60 tablet 0    Sig: Take 1 tablet (500 mg total) by mouth 2 (two) times daily with a meal.     Endocrinology:  Diabetes - Biguanides Failed - 04/27/2019 12:31 PM      Failed - Cr in normal range and within 360 days    Creatinine, Ser  Date Value Ref Range Status  02/06/2018 1.50 (H) 0.61 - 1.24 mg/dL Final         Failed - HBA1C is between 0 and 7.9 and within 180 days    No results found for: HGBA1C       Failed - eGFR in normal range and within 360 days    GFR calc Af Amer  Date Value Ref Range Status  02/06/2018 59 (L) >60 mL/min Final    Comment:    (NOTE) The eGFR has been calculated using the CKD EPI equation. This calculation has not been validated in all clinical situations. eGFR's persistently <60 mL/min signify possible Chronic Kidney Disease.    GFR calc non Af Amer  Date Value Ref Range Status  02/06/2018 51 (L) >60 mL/min Final         Passed - Valid encounter within last 6 months    Recent Outpatient Visits          1 month ago Type 2 diabetes  mellitus with other specified complication, without long-term current use of insulin ([place])   [facility] [name], Malka So, MD

## 2019-06-18 ENCOUNTER — Other Ambulatory Visit: Payer: Self-pay | Admitting: Family Medicine

## 2019-06-18 DIAGNOSIS — E785 Hyperlipidemia, unspecified: Secondary | ICD-10-CM

## 2019-06-18 DIAGNOSIS — I2510 Atherosclerotic heart disease of native coronary artery without angina pectoris: Secondary | ICD-10-CM

## 2019-06-18 DIAGNOSIS — I10 Essential (primary) hypertension: Secondary | ICD-10-CM

## 2019-06-18 DIAGNOSIS — E1169 Type 2 diabetes mellitus with other specified complication: Secondary | ICD-10-CM

## 2019-07-16 ENCOUNTER — Other Ambulatory Visit: Payer: Self-pay | Admitting: Family Medicine

## 2019-08-14 ENCOUNTER — Other Ambulatory Visit: Payer: Self-pay | Admitting: Family Medicine

## 2019-08-14 DIAGNOSIS — I10 Essential (primary) hypertension: Secondary | ICD-10-CM

## 2019-08-14 DIAGNOSIS — E1169 Type 2 diabetes mellitus with other specified complication: Secondary | ICD-10-CM

## 2019-08-14 DIAGNOSIS — I2510 Atherosclerotic heart disease of native coronary artery without angina pectoris: Secondary | ICD-10-CM

## 2019-09-12 ENCOUNTER — Other Ambulatory Visit: Payer: Self-pay | Admitting: Family Medicine

## 2019-09-12 DIAGNOSIS — E785 Hyperlipidemia, unspecified: Secondary | ICD-10-CM

## 2019-09-12 DIAGNOSIS — E1169 Type 2 diabetes mellitus with other specified complication: Secondary | ICD-10-CM

## 2019-09-12 DIAGNOSIS — I2510 Atherosclerotic heart disease of native coronary artery without angina pectoris: Secondary | ICD-10-CM

## 2019-09-12 DIAGNOSIS — I10 Essential (primary) hypertension: Secondary | ICD-10-CM

## 2020-06-24 ENCOUNTER — Telehealth: Payer: Self-pay | Admitting: Family Medicine

## 2020-06-24 NOTE — Telephone Encounter (Signed)
Tried calling to  schedule Medicare Annual Wellness Visit (AWV) either virtually or in office. No answer   Last AWV no information please schedule at anytime with [facility] Nurse Health Advisor 1 or 2   This should be a 45 minute visit. Pt also needs appointment with pcp  Last appointment 03/27/2019

## 2020-07-25 ENCOUNTER — Telehealth: Payer: Self-pay | Admitting: Family Medicine

## 2020-07-25 NOTE — Telephone Encounter (Signed)
Tried calling patient to  schedule Medicare Annual Wellness Visit (AWV) either virtually or in office.  No answer   AWVI  please schedule at anytime with [facility] Nurse Health Advisor 1 or 2   This should be a 45 minute visit. Patient also needs appointment with PCP last appointment 03/27/2019

## 2020-11-17 ENCOUNTER — Telehealth: Payer: Self-pay | Admitting: Family Medicine

## 2020-11-17 NOTE — Telephone Encounter (Signed)
Tried calling patient to schedule Medicare Annual Wellness Visit (AWV) either virtually or in office.   AWV-I per [place] 04/24/2019 please schedule at anytime with [facility] Nurse Health Advisor 1 or 2  Patient also needs appointment with pcp last appointment 03/27/2019  This should be a 45 minute visit.

## 2020-12-16 ENCOUNTER — Telehealth: Payer: Self-pay | Admitting: Family Medicine

## 2020-12-16 NOTE — Telephone Encounter (Signed)
Left message for patient to call back and schedule Medicare Annual Wellness Visit (AWV) either virtually or in office.   AWV-I per [place] 04/24/2019 please schedule at anytime with [facility] Nurse Health Advisor 1 or 2  Patient also needs appointment with PCP last appointment 03/27/2019   This should be a 45 minute visit.

## 2021-01-01 ENCOUNTER — Telehealth: Payer: Self-pay | Admitting: Family Medicine

## 2021-01-01 NOTE — Telephone Encounter (Signed)
Tried calling patient to schedule Medicare Annual Wellness Visit (AWV) either virtually or in office.   No answer   AWV-I per [place] 04/24/2019  please schedule at anytime with [facility] Nurse Health Advisor 1 or 2   This should be a 45 minute visit.

## 2021-01-02 ENCOUNTER — Ambulatory Visit
Admission: EM | Admit: 2021-01-02 | Discharge: 2021-01-02 | Disposition: A | Discharge status: Home / Self Care | Payer: Medicare Other | Attending: Emergency Medicine | Admitting: Emergency Medicine

## 2021-01-02 ENCOUNTER — Encounter: Payer: Self-pay | Admitting: Emergency Medicine

## 2021-01-02 ENCOUNTER — Other Ambulatory Visit: Payer: Self-pay

## 2021-01-02 DIAGNOSIS — E785 Hyperlipidemia, unspecified: Secondary | ICD-10-CM

## 2021-01-02 DIAGNOSIS — N182 Chronic kidney disease, stage 2 (mild): Secondary | ICD-10-CM

## 2021-01-02 DIAGNOSIS — I2510 Atherosclerotic heart disease of native coronary artery without angina pectoris: Secondary | ICD-10-CM

## 2021-01-02 DIAGNOSIS — E1169 Type 2 diabetes mellitus with other specified complication: Secondary | ICD-10-CM | POA: Diagnosis not present

## 2021-01-02 DIAGNOSIS — I10 Essential (primary) hypertension: Secondary | ICD-10-CM

## 2021-01-02 LAB — POCT URINALYSIS DIP (MANUAL ENTRY)
Bilirubin, UA: NEGATIVE
Glucose, UA: >=1000 mg/dL — AB
Ketones, POC UA: NEGATIVE mg/dL
Leukocytes, UA: NEGATIVE
Nitrite, UA: NEGATIVE
Protein Ur, POC: >=300 mg/dL — AB
Spec Grav, UA: 1.02 (ref 1.010–1.025)
Urobilinogen, UA: 0.2 E.U./dL
pH, UA: 5.5 (ref 5.0–8.0)

## 2021-01-02 LAB — POCT FASTING CBG KUC MANUAL ENTRY: POCT Glucose (KUC): 383 mg/dL — AB (ref 70–99)

## 2021-01-02 MED ORDER — GLIPIZIDE ER 10 MG PO TB24: 10.0000 mg | ORAL_TABLET | Freq: Every day | ORAL | 1 refills | Status: DC

## 2021-01-02 MED ORDER — POLYETHYLENE GLYCOL 3350 17 G PO PACK: 17.0000 g | PACK | Freq: Every day | ORAL | 0 refills | Status: DC

## 2021-01-02 MED ORDER — ENTRESTO 24-26 MG PO TABS: 1.0000 | ORAL_TABLET | Freq: Two times a day (BID) | ORAL | 0 refills | Status: DC

## 2021-01-02 MED ORDER — DOCUSATE SODIUM 100 MG PO CAPS: 100.0000 mg | ORAL_CAPSULE | Freq: Two times a day (BID) | ORAL | 0 refills | Status: DC

## 2021-01-02 MED ORDER — ATORVASTATIN CALCIUM 40 MG PO TABS: 40.0000 mg | ORAL_TABLET | Freq: Every day | ORAL | 0 refills | Status: DC

## 2021-01-02 MED ORDER — ISOSORBIDE MONONITRATE ER 60 MG PO TB24: 60.0000 mg | ORAL_TABLET | Freq: Every day | ORAL | 0 refills | Status: DC

## 2021-01-02 MED ORDER — CLOPIDOGREL BISULFATE 75 MG PO TABS: 75.0000 mg | ORAL_TABLET | Freq: Every day | ORAL | 0 refills | Status: DC

## 2021-01-02 MED ORDER — METFORMIN HCL 500 MG PO TABS: 500.0000 mg | ORAL_TABLET | Freq: Two times a day (BID) | ORAL | 1 refills | Status: DC

## 2021-01-02 MED ORDER — CARVEDILOL 25 MG PO TABS: ORAL_TABLET | ORAL | 0 refills | Status: DC

## 2021-01-02 MED ORDER — HYDRALAZINE HCL 50 MG PO TABS: 50.0000 mg | ORAL_TABLET | Freq: Three times a day (TID) | ORAL | 0 refills | Status: DC

## 2021-01-02 MED ORDER — GABAPENTIN 100 MG PO CAPS: 100.0000 mg | ORAL_CAPSULE | Freq: Three times a day (TID) | ORAL | 0 refills | Status: DC

## 2021-01-02 MED ORDER — ASPIRIN EC 81 MG PO TBEC: 81.0000 mg | DELAYED_RELEASE_TABLET | Freq: Every day | ORAL | 0 refills | Status: DC

## 2021-01-02 NOTE — ED Triage Notes (Signed)
Patient presents to [facility] for evaluation of abdominal pain, generalized weakness, malaise.    States this is how he feels when his blood sugar is high,.  CBG 382 at triage.  Also c/o constipation.

## 2021-01-02 NOTE — Discharge Instructions (Addendum)
Blood work pending-I will call you if anything is abnormal Restart taking your diabetes, blood pressure and heart medicines Monitor blood pressure at home Monitor blood sugar at home Please call primary care first thing Monday morning to set up follow-up appointment If your symptoms are worsening please go to emergency room  For your constipation use daily MiraLAX with Colace twice daily

## 2021-01-02 NOTE — ED Provider Notes (Signed)
[facility]    CSN: [id number] Arrival date & time: 01/02/21  1036      History   Chief Complaint Chief Complaint  Patient presents with   Weakness    HPI Thomas West is a 60 y.o. male history of hypertension, CAD, CHF, DM type II presenting today for evaluation of diabetes/medication refill.  Patient reports that he has been out of all of his medicines for many months.  He reports being trying to get set up with PCP, but on chart review it appears that his PCP has been attempting to reach out to him, reports that he does not pick up numbers he is unfamiliar with as he is concerned about scam calls.  He reports of recently he has had worsening abdominal discomfort, constipation, bilateral lower leg swelling.  He denies any nausea or vomiting.  Denies headache or vision changes.  Denies chest pain.  Last bowel movement was yesterday, but reports a lot of straining.  HPI  Past Medical History:  Diagnosis Date   CHF (congestive heart failure) (HCC)    Coronary artery disease    Diabetes mellitus without complication (HCC)    History of MI (myocardial infarction) 03/05/2019   Hypertension    Stroke [facility])     Patient Active Problem List   Diagnosis Date Noted   Diabetes mellitus (HCC) 03/27/2019   Hyperlipidemia associated with type 2 diabetes mellitus (HCC) 03/27/2019   CAD (coronary artery disease) 03/05/2019   History of MI (myocardial infarction) 03/05/2019   Type 2 diabetes mellitus with diabetic neuropathy, unspecified (HCC) 03/05/2019   CKD (chronic kidney disease), stage II 03/05/2019   HLD (hyperlipidemia) 03/05/2019   GERD (gastroesophageal reflux disease) 03/05/2019   Hypertension, essential, benign 03/05/2019    Past Surgical History:  Procedure Laterality Date   leg surgery     "pins in left shin"       Home Medications    Prior to Admission medications   Medication Sig Start Date End Date Taking? Authorizing Provider  docusate sodium  (COLACE) 100 MG capsule Take 1 capsule (100 mg total) by mouth every 12 (twelve) hours. 01/02/21  Yes Winnifred Dufford C, PA-C  polyethylene glycol (MIRALAX / GLYCOLAX) 17 g packet Take 17 g by mouth daily. 01/02/21  Yes Mykah Bellomo C, PA-C  aspirin EC 81 MG tablet Take 1 tablet (81 mg total) by mouth daily. 01/02/21   Chayim Bialas C, PA-C  atorvastatin (LIPITOR) 40 MG tablet Take 1 tablet (40 mg total) by mouth at bedtime. 01/02/21   Regina Coppolino C, PA-C  carvedilol (COREG) 25 MG tablet TAKE ONE TABLET BY MOUTH EVERY 12 HOURS (10AM &10PM) 01/02/21   Khaila Velarde C, PA-C  clopidogrel (PLAVIX) 75 MG tablet Take 1 tablet (75 mg total) by mouth daily. 01/02/21   Daquana Paddock C, PA-C  gabapentin (NEURONTIN) 100 MG capsule Take 1 capsule (100 mg total) by mouth 3 (three) times daily. 01/02/21   Ellan Tess C, PA-C  glipiZIDE (GLUCOTROL XL) 10 MG 24 hr tablet Take 1 tablet (10 mg total) by mouth daily with breakfast. 01/02/21   Zackry Deines C, PA-C  hydrALAZINE (APRESOLINE) 50 MG tablet Take 1 tablet (50 mg total) by mouth 3 (three) times daily. 01/02/21   Jaquila Santelli C, PA-C  isosorbide mononitrate (IMDUR) 60 MG 24 hr tablet Take 1 tablet (60 mg total) by mouth daily. 01/02/21   Arlo Buffone C, PA-C  loperamide (IMODIUM) 2 MG capsule Take 1 capsule (2 mg total)  by mouth as needed for diarrhea or loose stools. 02/06/18   Terrilee Files, MD  metFORMIN (GLUCOPHAGE) 500 MG tablet Take 1 tablet (500 mg total) by mouth 2 (two) times daily with a meal. 01/02/21   Amarie Viles C, PA-C  nitroGLYCERIN (NITROSTAT) 0.3 MG SL tablet Place 1 tablet (0.3 mg total) under the tongue every 5 (five) minutes as needed for chest pain. 03/05/19   Eustace Moore, MD  oxyCODONE-acetaminophen (PERCOCET/ROXICET) 5-325 MG tablet Take by mouth 2 (two) times daily as needed for severe pain.    [provider]  sacubitril-valsartan (ENTRESTO) 24-26 MG Take 1 tablet by mouth 2 (two) times daily.  01/02/21   Dolores Mcgovern C, PA-C  simethicone (MYLICON) 125 MG chewable tablet Chew 125 mg by mouth every 6 (six) hours as needed for flatulence.    [provider]    Family History History reviewed. No pertinent family history.  Social History Social History   Tobacco Use   Smoking status: Former    Types: Cigarettes    Quit date: 10/06/2017    Years since quitting: 3.2   Smokeless tobacco: Never  Substance Use Topics   Alcohol use: Not Currently   Drug use: Yes    Types: Marijuana     Allergies   Bee venom   Review of Systems Review of Systems  Constitutional:  Negative for fatigue and fever.  HENT:  Negative for congestion, sinus pressure and sore throat.   Eyes:  Negative for photophobia, pain and visual disturbance.  Respiratory:  Negative for cough and shortness of breath.   Cardiovascular:  Positive for leg swelling. Negative for chest pain.  Gastrointestinal:  Positive for abdominal pain and constipation. Negative for nausea and vomiting.  Genitourinary:  Negative for decreased urine volume and hematuria.  Musculoskeletal:  Negative for myalgias, neck pain and neck stiffness.  Neurological:  Negative for dizziness, syncope, facial asymmetry, speech difficulty, weakness, light-headedness, numbness and headaches.    Physical Exam Triage Vital Signs ED Triage Vitals  Enc Vitals Group     BP      Pulse      Resp      Temp      Temp src      SpO2      Weight      Height      Head Circumference      Peak Flow      Pain Score      Pain Loc      Pain Edu?      Excl. in GC?    No data found.  Updated Vital Signs BP (!) 180/129 (BP Location: Right Arm)   Pulse (!) 105   Temp 98.7 F (37.1 C) (Oral)   Resp 18   SpO2 98%   Visual Acuity Right Eye Distance:   Left Eye Distance:   Bilateral Distance:    Right Eye Near:   Left Eye Near:    Bilateral Near:     Physical Exam Vitals and nursing note reviewed.  Constitutional:       Appearance: He is well-developed.     Comments: No acute distress  HENT:     Head: Normocephalic and atraumatic.     Nose: Nose normal.  Eyes:     Extraocular Movements: Extraocular movements intact.     Conjunctiva/sclera: Conjunctivae normal.     Pupils: Pupils are equal, round, and reactive to light.  Cardiovascular:     Rate and Rhythm:  Regular rhythm. Tachycardia present.  Pulmonary:     Effort: Pulmonary effort is normal. No respiratory distress.     Comments: Breathing comfortably at rest, CTABL, no wheezing, rales or other adventitious sounds auscultated   Abdominal:     General: There is no distension.  Musculoskeletal:        General: Normal range of motion.     Cervical back: Neck supple.  Skin:    General: Skin is warm and dry.  Neurological:     Mental Status: He is alert and oriented to person, place, and time.     UC Treatments / Results  Labs (all labs ordered are listed, but only abnormal results are displayed) Labs Reviewed  COMPREHENSIVE METABOLIC PANEL - Abnormal; Notable for the following components:      Result Value   Glucose 417 (*)    Creatinine, Ser 1.38 (*)    Potassium 5.3 (*)    Chloride 95 (*)    Albumin 3.4 (*)    Alkaline Phosphatase 210 (*)    All other components within normal limits   Narrative:    Performed at:  [address] - [facility] [address], [place], [place]  [id number] Lab Director: Jolene SchimkeSanjai Nagendra MD, Phone:  [phone]  POCT FASTING CBG K[facility] MANUAL ENTRY - Abnormal; Notable for the following components:   POCT Glucose ([facility]) 383 (*)    All other components within normal limits  POCT URINALYSIS DIP (MANUAL ENTRY) - Abnormal; Notable for the following components:   Glucose, UA >=1,000 (*)    Blood, UA trace-lysed (*)    Protein Ur, POC >=300 (*)    All other components within normal limits  CBC   Narrative:    Performed at:  [address] [employer]- [facility] [address], [place], [place]  [id number] Lab Director: Jolene SchimkeSanjai  Nagendra MD, Phone:  [phone]    EKG   Radiology No results found.  Procedures Procedures (including critical care time)  Medications Ordered in UC Medications - No data to display  Initial Impression / Assessment and Plan / UC Course  I have reviewed the triage vital signs and the nursing notes.  Pertinent labs & imaging results that were available during my care of the patient were reviewed by me and considered in my medical decision making (see chart for details).     Hypertension/CHF-blood pressure elevated today, recheck similarly elevated; edema to lower legs-refilled hydralazine, carvedilol, Entresto, Imdur, aspirin and Plavix-checking CBC, CMP DM type II-blood sugar 383 today, no ketones in urine, refilling metformin, glipizide Hyperlipidemia-refilled Lipitor Constipation-recommended MiraLAX and Colace, but advised to go to emergency room if developing worsening abdominal pain, bowel/gas  Discussed with patient that he is borderline needing to be evaluated in the emergency room with his blood pressure causing hypertensive emergency, turning into DKA for heart failure stressed importance of following up with primary care for regular monitoring and management of his chronic conditions and medicines.  Discussed strict return precautions. Patient verbalized understanding and is agreeable with plan.  Final Clinical Impressions(s) / UC Diagnoses   Final diagnoses:  Hypertension, essential, benign  Type 2 diabetes mellitus with other specified complication, without long-term current use of insulin (HCC)  CKD (chronic kidney disease), stage II  Hyperlipidemia associated with type 2 diabetes mellitus (HCC)  Coronary artery disease involving native coronary artery of native heart, unspecified whether angina present     Discharge Instructions      Blood work pending-I will call you if anything is abnormal Restart taking  your diabetes, blood pressure and heart  medicines Monitor blood pressure at home Monitor blood sugar at home Please call primary care first thing Monday morning to set up follow-up appointment If your symptoms are worsening please go to emergency room  For your constipation use daily MiraLAX with Colace twice daily      ED Prescriptions     Medication Sig Dispense Auth. Provider   aspirin EC 81 MG tablet Take 1 tablet (81 mg total) by mouth daily. 30 tablet Chae Oommen C, PA-C   atorvastatin (LIPITOR) 40 MG tablet Take 1 tablet (40 mg total) by mouth at bedtime. 30 tablet Oneal Biglow C, PA-C   gabapentin (NEURONTIN) 100 MG capsule Take 1 capsule (100 mg total) by mouth 3 (three) times daily. 90 capsule Lannie Heaps C, PA-C   clopidogrel (PLAVIX) 75 MG tablet Take 1 tablet (75 mg total) by mouth daily. 30 tablet Jlee Harkless C, PA-C   carvedilol (COREG) 25 MG tablet TAKE ONE TABLET BY MOUTH EVERY 12 HOURS (10AM &10PM) 60 tablet Teal Raben C, PA-C   glipiZIDE (GLUCOTROL XL) 10 MG 24 hr tablet Take 1 tablet (10 mg total) by mouth daily with breakfast. 30 tablet Tamarah Bhullar C, PA-C   hydrALAZINE (APRESOLINE) 50 MG tablet Take 1 tablet (50 mg total) by mouth 3 (three) times daily. 90 tablet Izaiyah Kleinman C, PA-C   metFORMIN (GLUCOPHAGE) 500 MG tablet Take 1 tablet (500 mg total) by mouth 2 (two) times daily with a meal. 60 tablet Demarion Pondexter C, PA-C   sacubitril-valsartan (ENTRESTO) 24-26 MG Take 1 tablet by mouth 2 (two) times daily. 60 tablet Ramiz Turpin C, PA-C   isosorbide mononitrate (IMDUR) 60 MG 24 hr tablet Take 1 tablet (60 mg total) by mouth daily. 30 tablet Mehar Kirkwood C, PA-C   polyethylene glycol (MIRALAX / GLYCOLAX) 17 g packet Take 17 g by mouth daily. 14 each Katesha Eichel C, PA-C   docusate sodium (COLACE) 100 MG capsule Take 1 capsule (100 mg total) by mouth every 12 (twelve) hours. 60 capsule Neidy Guerrieri, [place] C, PA-C      PDMP not reviewed this encounter.   Lew Dawes, PA-C 01/03/21 1054

## 2021-01-03 LAB — CBC
Hematocrit: 50 % (ref 37.5–51.0)
Hemoglobin: 16 g/dL (ref 13.0–17.7)
MCH: 28.3 pg (ref 26.6–33.0)
MCHC: 32 g/dL (ref 31.5–35.7)
MCV: 88 fL (ref 79–97)
Platelets: 266 10*3/uL (ref 150–450)
RBC: 5.66 x10E6/uL (ref 4.14–5.80)
RDW: 13.7 % (ref 11.6–15.4)
WBC: 8.1 10*3/uL (ref 3.4–10.8)

## 2021-01-03 LAB — COMPREHENSIVE METABOLIC PANEL
ALT: 43 IU/L (ref 0–44)
AST: 30 IU/L (ref 0–40)
Albumin/Globulin Ratio: 1.3 (ref 1.2–2.2)
Albumin: 3.4 g/dL — ABNORMAL LOW (ref 3.8–4.9)
Alkaline Phosphatase: 210 IU/L — ABNORMAL HIGH (ref 44–121)
BUN/Creatinine Ratio: 13 (ref 9–20)
BUN: 18 mg/dL (ref 6–24)
Bilirubin Total: 1.2 mg/dL (ref 0.0–1.2)
CO2: 25 mmol/L (ref 20–29)
Calcium: 9.1 mg/dL (ref 8.7–10.2)
Chloride: 95 mmol/L — ABNORMAL LOW (ref 96–106)
Creatinine, Ser: 1.38 mg/dL — ABNORMAL HIGH (ref 0.76–1.27)
Globulin, Total: 2.6 g/dL (ref 1.5–4.5)
Glucose: 417 mg/dL — ABNORMAL HIGH (ref 65–99)
Potassium: 5.3 mmol/L — ABNORMAL HIGH (ref 3.5–5.2)
Sodium: 135 mmol/L (ref 134–144)
Total Protein: 6 g/dL (ref 6.0–8.5)
eGFR: 60 mL/min/{1.73_m2} (ref >59)

## 2021-02-03 ENCOUNTER — Other Ambulatory Visit: Payer: Self-pay

## 2021-02-03 ENCOUNTER — Other Ambulatory Visit: Payer: Medicare Other

## 2021-02-03 ENCOUNTER — Encounter: Payer: Self-pay | Admitting: Family Medicine

## 2021-02-03 ENCOUNTER — Ambulatory Visit (INDEPENDENT_AMBULATORY_CARE_PROVIDER_SITE_OTHER): Payer: Medicare Other | Admitting: Family Medicine

## 2021-02-03 VITALS — BP 130/80 | HR 57 | Temp 98.2°F | Resp 16 | Ht 69.0 in | Wt 161.0 lb

## 2021-02-03 DIAGNOSIS — G63 Polyneuropathy in diseases classified elsewhere: Secondary | ICD-10-CM

## 2021-02-03 DIAGNOSIS — G629 Polyneuropathy, unspecified: Secondary | ICD-10-CM | POA: Insufficient documentation

## 2021-02-03 DIAGNOSIS — E1140 Type 2 diabetes mellitus with diabetic neuropathy, unspecified: Secondary | ICD-10-CM | POA: Diagnosis not present

## 2021-02-03 DIAGNOSIS — E785 Hyperlipidemia, unspecified: Secondary | ICD-10-CM

## 2021-02-03 DIAGNOSIS — R0989 Other specified symptoms and signs involving the circulatory and respiratory systems: Secondary | ICD-10-CM

## 2021-02-03 DIAGNOSIS — N182 Chronic kidney disease, stage 2 (mild): Secondary | ICD-10-CM

## 2021-02-03 DIAGNOSIS — K5900 Constipation, unspecified: Secondary | ICD-10-CM

## 2021-02-03 DIAGNOSIS — E1169 Type 2 diabetes mellitus with other specified complication: Secondary | ICD-10-CM | POA: Diagnosis not present

## 2021-02-03 DIAGNOSIS — I5020 Unspecified systolic (congestive) heart failure: Secondary | ICD-10-CM | POA: Diagnosis not present

## 2021-02-03 DIAGNOSIS — I10 Essential (primary) hypertension: Secondary | ICD-10-CM | POA: Diagnosis not present

## 2021-02-03 DIAGNOSIS — R748 Abnormal levels of other serum enzymes: Secondary | ICD-10-CM | POA: Diagnosis not present

## 2021-02-03 DIAGNOSIS — E1122 Type 2 diabetes mellitus with diabetic chronic kidney disease: Secondary | ICD-10-CM

## 2021-02-03 DIAGNOSIS — Z1159 Encounter for screening for other viral diseases: Secondary | ICD-10-CM

## 2021-02-03 DIAGNOSIS — I2510 Atherosclerotic heart disease of native coronary artery without angina pectoris: Secondary | ICD-10-CM

## 2021-02-03 LAB — LIPID PANEL
Cholesterol: 189 mg/dL (ref 0–200)
HDL: 44.6 mg/dL (ref >39.00)
LDL Cholesterol: 120 mg/dL — ABNORMAL HIGH (ref 0–99)
NonHDL: 144.29
Total CHOL/HDL Ratio: 4
Triglycerides: 123 mg/dL (ref 0.0–149.0)
VLDL: 24.6 mg/dL (ref 0.0–40.0)

## 2021-02-03 LAB — HEPATIC FUNCTION PANEL
ALT: 35 U/L (ref 0–53)
AST: 22 U/L (ref 0–37)
Albumin: 3.3 g/dL — ABNORMAL LOW (ref 3.5–5.2)
Alkaline Phosphatase: 227 U/L — ABNORMAL HIGH (ref 39–117)
Bilirubin, Direct: 0.3 mg/dL (ref 0.0–0.3)
Total Bilirubin: 1.2 mg/dL (ref 0.2–1.2)
Total Protein: 6.2 g/dL (ref 6.0–8.3)

## 2021-02-03 LAB — BRAIN NATRIURETIC PEPTIDE: Pro B Natriuretic peptide (BNP): 3012 pg/mL — ABNORMAL HIGH (ref 0.0–100.0)

## 2021-02-03 LAB — BASIC METABOLIC PANEL
BUN: 15 mg/dL (ref 6–23)
CO2: 31 mEq/L (ref 19–32)
Calcium: 9 mg/dL (ref 8.4–10.5)
Chloride: 96 mEq/L (ref 96–112)
Creatinine, Ser: 1.46 mg/dL (ref 0.40–1.50)
GFR: 52.93 mL/min — ABNORMAL LOW (ref >60.00)
Glucose, Bld: 415 mg/dL — ABNORMAL HIGH (ref 70–99)
Potassium: 4.9 mEq/L (ref 3.5–5.1)
Sodium: 134 mEq/L — ABNORMAL LOW (ref 135–145)

## 2021-02-03 LAB — MICROALBUMIN / CREATININE URINE RATIO
Creatinine,U: 76.3 mg/dL
Microalb Creat Ratio: 216.5 mg/g — ABNORMAL HIGH (ref 0.0–30.0)
Microalb, Ur: 165.1 mg/dL — ABNORMAL HIGH (ref 0.0–1.9)

## 2021-02-03 LAB — TSH: TSH: 1.37 u[IU]/mL (ref 0.35–5.50)

## 2021-02-03 LAB — GAMMA GT: GGT: 597 U/L — ABNORMAL HIGH (ref 7–51)

## 2021-02-03 MED ORDER — ATORVASTATIN CALCIUM 80 MG PO TABS: 80.0000 mg | ORAL_TABLET | Freq: Every day | ORAL | 3 refills | Status: DC

## 2021-02-03 MED ORDER — GABAPENTIN 100 MG PO CAPS: 100.0000 mg | ORAL_CAPSULE | Freq: Three times a day (TID) | ORAL | 0 refills | Status: DC

## 2021-02-03 MED ORDER — HYDRALAZINE HCL 50 MG PO TABS: 50.0000 mg | ORAL_TABLET | Freq: Three times a day (TID) | ORAL | 0 refills | Status: DC

## 2021-02-03 MED ORDER — ENTRESTO 24-26 MG PO TABS: 1.0000 | ORAL_TABLET | Freq: Two times a day (BID) | ORAL | 2 refills | Status: DC

## 2021-02-03 MED ORDER — CARVEDILOL 25 MG PO TABS: ORAL_TABLET | ORAL | 0 refills | Status: DC

## 2021-02-03 MED ORDER — METFORMIN HCL 500 MG PO TABS: 500.0000 mg | ORAL_TABLET | Freq: Two times a day (BID) | ORAL | 1 refills | Status: DC

## 2021-02-03 MED ORDER — FUROSEMIDE 20 MG PO TABS: 20.0000 mg | ORAL_TABLET | Freq: Every day | ORAL | 3 refills | Status: DC

## 2021-02-03 MED ORDER — CLOPIDOGREL BISULFATE 75 MG PO TABS: 75.0000 mg | ORAL_TABLET | Freq: Every day | ORAL | 0 refills | Status: DC

## 2021-02-03 NOTE — Assessment & Plan Note (Signed)
We discussed the importance of appropriate foot care and better glucose control. Continue gabapentin 100 mg 3 times daily.

## 2021-02-03 NOTE — Assessment & Plan Note (Signed)
We discussed the importance of adequate BP and glucose control. Adequate hydration, low-salt diet. Recommend avoiding NSAIDs. Further recommendations according to BMP result.

## 2021-02-03 NOTE — Patient Instructions (Addendum)
A few things to remember from today's visit:   Type 2 diabetes mellitus with other specified complication, without long-term current use of insulin (HCC) - Plan: Hemoglobin A1c, Microalbumin/Creatinine Ratio, Urine, Ambulatory referral to Cardiology, Brain Natriuretic Peptide, Basic metabolic panel, Fructosamine, Hepatic function panel, CANCELED: POC HgB A1c  Hypertension, essential, benign - Plan: TSH  Hyperlipidemia associated with type 2 diabetes mellitus (HCC) - Plan: Lipid panel  Encounter for HCV screening test for low risk patient - Plan: Hepatitis C antibody  Elevated alkaline phosphatase level - Plan: Gamma GT, Hepatic function panel  HFrEF (heart failure with reduced ejection fraction) (HCC) - Plan: Ambulatory referral to Cardiology  Decreased pulses in feet - Plan: VAS [place] ABI WITH/WO TBI  If you need refills please call your pharmacy. Do not use My Chart to request refills or for acute issues that need immediate attention.   Release form to obtain records from cardiologist. Today Fluid pill added.  [place] Pepper is a good option for diabetes, so it will be recommended according to lab results but for now no changes in the rest of your medications. Low salt diet and the only med over the counter for pain is acetaminophen up to 4 tabs daily.  Please be sure medication list is accurate. If a new problem present, please set up appointment sooner than planned today.

## 2021-02-03 NOTE — Assessment & Plan Note (Addendum)
For now continue [place], we discussed some side effects. Also on carvedilol and hydralazine, no changes in current dose. Will try to obtain records from cardiologist in [facility], instructed to sign a release form. Continue low-salt diet. Furosemide 20 mg added today. Referral to cardiologist placed. Instructed about warning signs.

## 2021-02-03 NOTE — Assessment & Plan Note (Signed)
Continue atorvastatin 40 mg daily. Further recommendation will be given according to lipid panel result. 

## 2021-02-03 NOTE — Progress Notes (Signed)
HPI:  [name] is a 60 y.o. male with hx of CAD,CVA,CKD II,CHF,DM II,and HTN  here today for urgent care follow up.   He was last seen on 03/27/2019. He was evaluated in the ED for elevated BP.  Diabetes Mellitus II:  - Checking BG at home: 300-400. Not sure about last HgA1C. Labs ordered in 03/2019 but he did not have them done. - Medications: Metformin 500 mg bid, Glipizide XL 10 mg daily. - Compliance:Taking medication. - Diet: He is trying to do better but has not been consistent. - Exercise: No - eye exam: Reporting eye exam within the past year and negative for retinopathy. - foot exam: > A year ago. - microalbumin: > A year ago. - Negative for symptoms of hypoglycemia, polyuria, polydipsia, foot ulcers/trauma. +Polydipsia. Pen and needles sensation in feet. He is on Gabapentin 100 mg tid.  Hypertension:  Medications:Hydralazine 50 mg tid, Imdur 60 mg daily, Carvedilol 25 mg bid.  BP readings at home:"Pretty good." Side effects:None  HFrEF: Sleeps on 4 pillows, orthopnea. No PND. + LE edema, seems to be worse at the end of the day. Negative for leg pain and erythema.  He is on Entresto 24-26 mg bid. He has not seen cardiologist in 2 year in [place].  CAD s/p PCI with stent placement in 2019-2020. He is on Plavix 75 mg daily,Aspirin 81 mg daily, and Atorvastatin 40 mg daily.  Negative for unusual or severe headache, visual changes, exertional chest pain, focal weakness, or edema. "Sometimes" he feels like he "cannot breath." No associated dizziness or diaphoresis.  Lab Results  Component Value Date   CREATININE 1.38 (H) 01/02/2021   BUN 18 01/02/2021   NA 135 01/02/2021   K 5.3 (H) 01/02/2021   CL 95 (L) 01/02/2021   CO2 25 01/02/2021   Alkaline phosphatase mildly elevated.  Component     Latest Ref Rng & Units 02/03/2021  Total Protein     6.0 - 8.3 g/dL 6.2  Albumin     3.5 - 5.2 g/dL 3.3 (L)  Globulin, Total     1.5 - 4.5 g/dL    Albumin/Globulin Ratio     1.2 - 2.2   Total Bilirubin     0.2 - 1.2 mg/dL 1.2  Alkaline Phosphatase     39 - 117 U/L 227 (H)  AST     0 - 37 U/L 22  ALT     0 - 53 U/L 35   Nocturia x 3-4. Bloating sensation, bowel movements q 3-4 days. He has tried Mg citrate. He is not taking Colace or miralax.  Denies abdominal pain,nausea, vomiting, changes in bowel habits, blood in stool or melena.  Review of Systems  Constitutional:  Positive for fatigue. Negative for activity change, appetite change and fever.  HENT:  Negative for mouth sores, nosebleeds and sore throat.   Respiratory:  Negative for cough and wheezing.   Endocrine: Negative for cold intolerance and heat intolerance.  Genitourinary:  Negative for decreased urine volume, dysuria and hematuria.  Musculoskeletal:  Positive for arthralgias. Negative for gait problem and myalgias.  Skin:  Negative for pallor and rash.  Neurological:  Negative for syncope and weakness.  Psychiatric/Behavioral:  Negative for confusion.   Rest of ROS, see pertinent positives sand negatives in HPI  Current Outpatient Medications on File Prior to Visit  Medication Sig Dispense Refill   aspirin EC 81 MG tablet Take 1 tablet (81 mg total) by mouth daily. 30  tablet 0   atorvastatin (LIPITOR) 40 MG tablet Take 1 tablet (40 mg total) by mouth at bedtime. 30 tablet 0   carvedilol (COREG) 25 MG tablet TAKE ONE TABLET BY MOUTH EVERY 12 HOURS (10AM &10PM) 60 tablet 0   clopidogrel (PLAVIX) 75 MG tablet Take 1 tablet (75 mg total) by mouth daily. 30 tablet 0   docusate sodium (COLACE) 100 MG capsule Take 1 capsule (100 mg total) by mouth every 12 (twelve) hours. 60 capsule 0   gabapentin (NEURONTIN) 100 MG capsule Take 1 capsule (100 mg total) by mouth 3 (three) times daily. 90 capsule 0   glipiZIDE (GLUCOTROL XL) 10 MG 24 hr tablet Take 1 tablet (10 mg total) by mouth daily with breakfast. 30 tablet 1   hydrALAZINE (APRESOLINE) 50 MG tablet Take 1 tablet  (50 mg total) by mouth 3 (three) times daily. 90 tablet 0   isosorbide mononitrate (IMDUR) 60 MG 24 hr tablet Take 1 tablet (60 mg total) by mouth daily. 30 tablet 0   metFORMIN (GLUCOPHAGE) 500 MG tablet Take 1 tablet (500 mg total) by mouth 2 (two) times daily with a meal. 60 tablet 1   polyethylene glycol (MIRALAX / GLYCOLAX) 17 g packet Take 17 g by mouth daily. 14 each 0   sacubitril-valsartan (ENTRESTO) 24-26 MG Take 1 tablet by mouth 2 (two) times daily. 60 tablet 0   No current facility-administered medications on file prior to visit.   Past Medical History:  Diagnosis Date   CHF (congestive heart failure) (HCC)    Coronary artery disease    Diabetes mellitus without complication (HCC)    History of MI (myocardial infarction) 03/05/2019   Hypertension    Stroke (HCC)    Allergies  Allergen Reactions   Bee Venom     swelling    Social History   Socioeconomic History   Marital status: Single    Spouse name: Not on file   Number of children: Not on file   Years of education: Not on file   Highest education level: Not on file  Occupational History   Not on file  Tobacco Use   Smoking status: Former    Types: Cigarettes    Quit date: 10/06/2017    Years since quitting: 3.3   Smokeless tobacco: Never  Substance and Sexual Activity   Alcohol use: Not Currently   Drug use: Yes    Types: Marijuana   Sexual activity: Not on file  Other Topics Concern   Not on file  Social History Narrative   Not on file   Social Determinants of Health   Financial Resource Strain: Not on file  Food Insecurity: Not on file  Transportation Needs: Not on file  Physical Activity: Not on file  Stress: Not on file  Social Connections: Not on file   Vitals:   02/03/21 0806  BP: 130/80  Pulse: (!) 57  Resp: 16  Temp: 98.2 F (36.8 C)  SpO2: 97%   Body mass index is 23.78 kg/m.  Physical Exam Vitals and nursing note reviewed.  Constitutional:      General: He is not in  acute distress.    Appearance: He is well-developed and normal weight.  HENT:     Head: Normocephalic and atraumatic.     Mouth/Throat:     Mouth: Mucous membranes are moist.     Dentition: Abnormal dentition.     Pharynx: Oropharynx is clear.  Eyes:     Conjunctiva/sclera: Conjunctivae normal.  Neck:     Vascular: JVD present.  Cardiovascular:     Rate and Rhythm: Regular rhythm. Bradycardia present.     Pulses:          Dorsalis pedis pulses are 1+ on the right side.       Posterior tibial pulses are 1+ on the right side.     Heart sounds: Murmur (LUSB systo-diatolic I-II/VI) heard.     Comments: Cool toes. I could not find left foot pulse. Pulmonary:     Effort: Pulmonary effort is normal. No respiratory distress.     Breath sounds: Normal breath sounds.  Abdominal:     Palpations: Abdomen is soft. There is no hepatomegaly or mass.     Tenderness: There is no abdominal tenderness.  Musculoskeletal:     Right lower leg: 2+ Pitting Edema present.     Left lower leg: 2+ Pitting Edema present.  Lymphadenopathy:     Cervical: No cervical adenopathy.  Skin:    General: Skin is warm.     Findings: No erythema or rash.  Neurological:     Mental Status: He is alert and oriented to person, place, and time.     Cranial Nerves: No cranial nerve deficit.     Comments: Antalgic gait, limping.  Psychiatric:     Comments: Well groomed, good eye contact.   Diabetic Foot Exam - Simple   Simple Foot Form Diabetic Foot exam was performed with the following findings: Yes 02/03/2021  9:10 AM  Visual Inspection See comments: Yes Sensation Testing See comments: Yes Pulse Check See comments: Yes Comments Hypertrophic toenails. Cold feet. Decreased pulses and monofilament, bilateral.    ASSESSMENT AND PLAN:  [name] was seen today for ED follow-up.  Orders Placed This Encounter  Procedures   Hemoglobin A1c   Microalbumin/Creatinine Ratio, Urine   Gamma GT   Brain  Natriuretic Peptide   Basic metabolic panel   Fructosamine   Hepatitis C antibody   Hepatic function panel   TSH   Lipid panel   Ambulatory referral to Cardiology   VAS [place] ABI WITH/WO TBI   Lab Results  Component Value Date   CHOL 189 02/03/2021   HDL 44.60 02/03/2021   LDLCALC 120 (H) 02/03/2021   TRIG 123.0 02/03/2021   CHOLHDL 4 02/03/2021   Lab Results  Component Value Date   CREATININE 1.46 02/03/2021   BUN 15 02/03/2021   NA 134 (L) 02/03/2021   K 4.9 02/03/2021   CL 96 02/03/2021   CO2 31 02/03/2021   Lab Results  Component Value Date   ALT 35 02/03/2021   AST 22 02/03/2021   ALKPHOS 227 (H) 02/03/2021   BILITOT 1.2 02/03/2021   Lab Results  Component Value Date   TSH 1.37 02/03/2021   Lab Results  Component Value Date   MICROALBUR 165.1 (H) 02/03/2021    Hyperlipidemia associated with type 2 diabetes mellitus (HCC) Continue atorvastatin 40 mg daily. Further recommendation will be given according to lipid panel result.  Hypertension, essential, benign BP better controlled. Continue hydralazine, Imdur, and carvedilol same dose. Low-salt diet recommended. Continue monitoring BP regularly. Eye exam reported as current.   Type 2 diabetes mellitus with diabetic neuropathy, unspecified (HCC) Problem has not been well controlled. We discussed possible complications of elevated glucose. For now continue glipizide XL 10 mg daily and metformin 500 mg twice daily.  We discussed some side effects of medications, depending on lab results we may need to change  management. Regular exercise as tolerated and healthy diet with avoidance of added sugar food intake is an important part of treatment and recommended. Annual eye exam, periodic dental and foot care recommended. F/U in 4 months   CKD (chronic kidney disease), stage II We discussed the importance of adequate BP and glucose control. Adequate hydration, low-salt diet. Recommend avoiding NSAIDs. Further  recommendations according to BMP result.  HFrEF (heart failure with reduced ejection fraction) (HCC) For now continue Entresto, we discussed some side effects. Also on carvedilol and hydralazine, no changes in current dose. Continue low-salt diet. Furosemide 20 mg added today. Referral to cardiologist placed. Instructed about warning signs.  Peripheral neuropathy We discussed the importance of appropriate foot care and better glucose control. Continue gabapentin 100 mg 3 times daily.  Elevated alkaline phosphatase level We discussed possible etiologies. Further recommendations according to GGT result.  Decreased pulses in feet Left> right. Adequate foot care discussed. Continue Atorvastatin, Plavix,and Aspirin. Further recommendations according to ABI. Instructed about warning signs.  Encounter for HCV screening test for low risk patient -     Hepatitis C antibody  Constipation, unspecified constipation type Adequate fiber intake and hydration. Recommend trying OTC MiraLAX at bedtime as needed.  I spent a total of 49 minutes in both face to face and non face to face activities for this visit on the date of this encounter. During this time history was obtained and documented, examination was performed, prior labs reviewed, and assessment/plan discussed.  Return in about 6 weeks (around 03/17/2021).   Kyliyah Stirn G. [name], MD  [facility]. [place] office.

## 2021-02-03 NOTE — Assessment & Plan Note (Signed)
BP better controlled. Continue hydralazine, Imdur, and carvedilol same dose. Low-salt diet recommended. Continue monitoring BP regularly. Eye exam reported as current.

## 2021-02-03 NOTE — Addendum Note (Signed)
Addended by: Elita Boone E on: 02/03/2021 11:15 AM   Modules accepted: Orders

## 2021-02-03 NOTE — Assessment & Plan Note (Addendum)
Problem has not been well controlled. We discussed possible complications of elevated glucose. For now continue glipizide XL 10 mg daily and metformin 500 mg twice daily.  We discussed some side effects of medications, depending on lab results we may need to change management. Regular exercise as tolerated and healthy diet with avoidance of added sugar food intake is an important part of treatment and recommended. Annual eye exam, periodic dental and foot care recommended. F/U in 4 months

## 2021-02-04 ENCOUNTER — Other Ambulatory Visit: Payer: Self-pay | Admitting: Family Medicine

## 2021-02-04 DIAGNOSIS — I10 Essential (primary) hypertension: Secondary | ICD-10-CM

## 2021-02-04 LAB — HEMOGLOBIN A1C: Hgb A1c MFr Bld: >14 % of total Hgb — ABNORMAL HIGH (ref <5.7)

## 2021-02-05 LAB — FRUCTOSAMINE: Fructosamine: 436 umol/L — ABNORMAL HIGH (ref 205–285)

## 2021-02-05 LAB — HEPATITIS C ANTIBODY
Hepatitis C Ab: NONREACTIVE
SIGNAL TO CUT-OFF: 0.02 (ref <1.00)

## 2021-02-09 ENCOUNTER — Other Ambulatory Visit: Payer: Self-pay

## 2021-02-09 MED ORDER — PEN NEEDLES 32G X 4 MM MISC: 3 refills | Status: DC

## 2021-02-09 MED ORDER — BASAGLAR KWIKPEN 100 UNIT/ML ~~LOC~~ SOPN: 15.0000 [IU] | PEN_INJECTOR | Freq: Every day | SUBCUTANEOUS | 2 refills | Status: DC

## 2021-02-09 MED ORDER — EMPAGLIFLOZIN 10 MG PO TABS: 10.0000 mg | ORAL_TABLET | Freq: Every day | ORAL | 3 refills | Status: DC

## 2021-02-09 NOTE — Progress Notes (Signed)
I have attempted to contact patient on 9/14, 9/16, and 9/19 to go over his lab results. Patient has not returned the call and there is no way to leave a voicemail. I mailed patient a letter with his results, and asked for him to contact us with any questions. Rx's sent in.

## 2021-03-08 ENCOUNTER — Emergency Department (HOSPITAL_COMMUNITY): Payer: Medicare Other

## 2021-03-08 ENCOUNTER — Encounter (HOSPITAL_COMMUNITY): Payer: Self-pay | Admitting: *Deleted

## 2021-03-08 ENCOUNTER — Inpatient Hospital Stay (HOSPITAL_COMMUNITY)
Admission: EM | Admit: 2021-03-08 | Discharge: 2021-03-17 | DRG: 286 | Disposition: A | Discharge status: Home Health | Payer: Medicare Other | Attending: Internal Medicine | Admitting: Internal Medicine

## 2021-03-08 ENCOUNTER — Other Ambulatory Visit: Payer: Self-pay

## 2021-03-08 DIAGNOSIS — I252 Old myocardial infarction: Secondary | ICD-10-CM | POA: Diagnosis not present

## 2021-03-08 DIAGNOSIS — I130 Hypertensive heart and chronic kidney disease with heart failure and stage 1 through stage 4 chronic kidney disease, or unspecified chronic kidney disease: Principal | ICD-10-CM | POA: Diagnosis present

## 2021-03-08 DIAGNOSIS — N179 Acute kidney failure, unspecified: Secondary | ICD-10-CM | POA: Diagnosis present

## 2021-03-08 DIAGNOSIS — E785 Hyperlipidemia, unspecified: Secondary | ICD-10-CM | POA: Diagnosis present

## 2021-03-08 DIAGNOSIS — Z79899 Other long term (current) drug therapy: Secondary | ICD-10-CM

## 2021-03-08 DIAGNOSIS — Z87891 Personal history of nicotine dependence: Secondary | ICD-10-CM

## 2021-03-08 DIAGNOSIS — Z20822 Contact with and (suspected) exposure to covid-19: Secondary | ICD-10-CM | POA: Diagnosis present

## 2021-03-08 DIAGNOSIS — N182 Chronic kidney disease, stage 2 (mild): Secondary | ICD-10-CM | POA: Diagnosis not present

## 2021-03-08 DIAGNOSIS — I5020 Unspecified systolic (congestive) heart failure: Secondary | ICD-10-CM

## 2021-03-08 DIAGNOSIS — I509 Heart failure, unspecified: Secondary | ICD-10-CM

## 2021-03-08 DIAGNOSIS — E1165 Type 2 diabetes mellitus with hyperglycemia: Secondary | ICD-10-CM | POA: Diagnosis present

## 2021-03-08 DIAGNOSIS — Z794 Long term (current) use of insulin: Secondary | ICD-10-CM

## 2021-03-08 DIAGNOSIS — Z7902 Long term (current) use of antithrombotics/antiplatelets: Secondary | ICD-10-CM

## 2021-03-08 DIAGNOSIS — R0602 Shortness of breath: Secondary | ICD-10-CM | POA: Diagnosis not present

## 2021-03-08 DIAGNOSIS — E1169 Type 2 diabetes mellitus with other specified complication: Secondary | ICD-10-CM

## 2021-03-08 DIAGNOSIS — R2689 Other abnormalities of gait and mobility: Secondary | ICD-10-CM | POA: Diagnosis present

## 2021-03-08 DIAGNOSIS — E1122 Type 2 diabetes mellitus with diabetic chronic kidney disease: Secondary | ICD-10-CM | POA: Diagnosis present

## 2021-03-08 DIAGNOSIS — I2510 Atherosclerotic heart disease of native coronary artery without angina pectoris: Secondary | ICD-10-CM | POA: Diagnosis present

## 2021-03-08 DIAGNOSIS — I160 Hypertensive urgency: Secondary | ICD-10-CM | POA: Diagnosis present

## 2021-03-08 DIAGNOSIS — Z8673 Personal history of transient ischemic attack (TIA), and cerebral infarction without residual deficits: Secondary | ICD-10-CM

## 2021-03-08 DIAGNOSIS — Z91030 Bee allergy status: Secondary | ICD-10-CM

## 2021-03-08 DIAGNOSIS — E1140 Type 2 diabetes mellitus with diabetic neuropathy, unspecified: Secondary | ICD-10-CM | POA: Diagnosis present

## 2021-03-08 DIAGNOSIS — T502X5A Adverse effect of carbonic-anhydrase inhibitors, benzothiadiazides and other diuretics, initial encounter: Secondary | ICD-10-CM | POA: Diagnosis not present

## 2021-03-08 DIAGNOSIS — Z7984 Long term (current) use of oral hypoglycemic drugs: Secondary | ICD-10-CM

## 2021-03-08 DIAGNOSIS — Z7982 Long term (current) use of aspirin: Secondary | ICD-10-CM

## 2021-03-08 DIAGNOSIS — Z955 Presence of coronary angioplasty implant and graft: Secondary | ICD-10-CM

## 2021-03-08 DIAGNOSIS — I272 Pulmonary hypertension, unspecified: Secondary | ICD-10-CM | POA: Diagnosis present

## 2021-03-08 DIAGNOSIS — R7989 Other specified abnormal findings of blood chemistry: Secondary | ICD-10-CM | POA: Diagnosis present

## 2021-03-08 DIAGNOSIS — R4189 Other symptoms and signs involving cognitive functions and awareness: Secondary | ICD-10-CM | POA: Diagnosis present

## 2021-03-08 DIAGNOSIS — Z9114 Patient's other noncompliance with medication regimen: Secondary | ICD-10-CM

## 2021-03-08 DIAGNOSIS — I5023 Acute on chronic systolic (congestive) heart failure: Secondary | ICD-10-CM | POA: Diagnosis present

## 2021-03-08 DIAGNOSIS — N1831 Chronic kidney disease, stage 3a: Secondary | ICD-10-CM | POA: Diagnosis present

## 2021-03-08 DIAGNOSIS — G9341 Metabolic encephalopathy: Secondary | ICD-10-CM | POA: Diagnosis present

## 2021-03-08 DIAGNOSIS — I493 Ventricular premature depolarization: Secondary | ICD-10-CM | POA: Diagnosis present

## 2021-03-08 DIAGNOSIS — I10 Essential (primary) hypertension: Secondary | ICD-10-CM | POA: Diagnosis present

## 2021-03-08 DIAGNOSIS — I5043 Acute on chronic combined systolic (congestive) and diastolic (congestive) heart failure: Secondary | ICD-10-CM | POA: Diagnosis present

## 2021-03-08 LAB — BASIC METABOLIC PANEL
Anion gap: 11 (ref 5–15)
BUN: 20 mg/dL (ref 6–20)
CO2: 26 mmol/L (ref 22–32)
Calcium: 8.6 mg/dL — ABNORMAL LOW (ref 8.9–10.3)
Chloride: 100 mmol/L (ref 98–111)
Creatinine, Ser: 1.43 mg/dL — ABNORMAL HIGH (ref 0.61–1.24)
GFR, Estimated: 57 mL/min — ABNORMAL LOW (ref >60)
Glucose, Bld: 298 mg/dL — ABNORMAL HIGH (ref 70–99)
Potassium: 4.5 mmol/L (ref 3.5–5.1)
Sodium: 137 mmol/L (ref 135–145)

## 2021-03-08 LAB — TROPONIN I (HIGH SENSITIVITY)
Troponin I (High Sensitivity): 20 ng/L — ABNORMAL HIGH (ref <18)
Troponin I (High Sensitivity): 22 ng/L — ABNORMAL HIGH (ref <18)

## 2021-03-08 LAB — CBC WITH DIFFERENTIAL/PLATELET
Abs Immature Granulocytes: 0.02 10*3/uL (ref 0.00–0.07)
Basophils Absolute: 0.1 10*3/uL (ref 0.0–0.1)
Basophils Relative: 1 %
Eosinophils Absolute: 0.3 10*3/uL (ref 0.0–0.5)
Eosinophils Relative: 4 %
HCT: 48.4 % (ref 39.0–52.0)
Hemoglobin: 15.2 g/dL (ref 13.0–17.0)
Immature Granulocytes: 0 %
Lymphocytes Relative: 25 %
Lymphs Abs: 1.8 10*3/uL (ref 0.7–4.0)
MCH: 27.7 pg (ref 26.0–34.0)
MCHC: 31.4 g/dL (ref 30.0–36.0)
MCV: 88.2 fL (ref 80.0–100.0)
Monocytes Absolute: 0.8 10*3/uL (ref 0.1–1.0)
Monocytes Relative: 12 %
Neutro Abs: 4.1 10*3/uL (ref 1.7–7.7)
Neutrophils Relative %: 58 %
Platelets: 310 10*3/uL (ref 150–400)
RBC: 5.49 MIL/uL (ref 4.22–5.81)
RDW: 14.1 % (ref 11.5–15.5)
WBC: 7.1 10*3/uL (ref 4.0–10.5)
nRBC: 0 % (ref 0.0–0.2)

## 2021-03-08 LAB — CBG MONITORING, ED
Glucose-Capillary: 248 mg/dL — ABNORMAL HIGH (ref 70–99)
Glucose-Capillary: 340 mg/dL — ABNORMAL HIGH (ref 70–99)

## 2021-03-08 LAB — I-STAT VENOUS BLOOD GAS, ED
Acid-Base Excess: 3 mmol/L — ABNORMAL HIGH (ref 0.0–2.0)
Bicarbonate: 30.6 mmol/L — ABNORMAL HIGH (ref 20.0–28.0)
Calcium, Ion: 1.08 mmol/L — ABNORMAL LOW (ref 1.15–1.40)
HCT: 50 % (ref 39.0–52.0)
Hemoglobin: 17 g/dL (ref 13.0–17.0)
O2 Saturation: 76 %
Patient temperature: 37
Potassium: 4.4 mmol/L (ref 3.5–5.1)
Sodium: 140 mmol/L (ref 135–145)
TCO2: 32 mmol/L (ref 22–32)
pCO2, Ven: 55.3 mmHg (ref 44.0–60.0)
pH, Ven: 7.351 (ref 7.250–7.430)
pO2, Ven: 44 mmHg (ref 32.0–45.0)

## 2021-03-08 LAB — URINALYSIS, ROUTINE W REFLEX MICROSCOPIC
Bilirubin Urine: NEGATIVE
Glucose, UA: 150 mg/dL — AB
Hgb urine dipstick: NEGATIVE
Ketones, ur: NEGATIVE mg/dL
Leukocytes,Ua: NEGATIVE
Nitrite: NEGATIVE
Protein, ur: >=300 mg/dL — AB
Specific Gravity, Urine: 1.019 (ref 1.005–1.030)
pH: 5 (ref 5.0–8.0)

## 2021-03-08 LAB — RESP PANEL BY RT-PCR (FLU A&B, COVID) ARPGX2
Influenza A by PCR: NEGATIVE
Influenza B by PCR: NEGATIVE
SARS Coronavirus 2 by RT PCR: NEGATIVE

## 2021-03-08 LAB — HIV ANTIBODY (ROUTINE TESTING W REFLEX): HIV Screen 4th Generation wRfx: NONREACTIVE

## 2021-03-08 LAB — BRAIN NATRIURETIC PEPTIDE: B Natriuretic Peptide: 3620.7 pg/mL — ABNORMAL HIGH (ref 0.0–100.0)

## 2021-03-08 LAB — MAGNESIUM: Magnesium: 2 mg/dL (ref 1.7–2.4)

## 2021-03-08 MED ORDER — CARVEDILOL 25 MG PO TABS
25.0000 mg | ORAL_TABLET | Freq: Two times a day (BID) | ORAL | Status: DC
  Administered 2021-03-08 – 2021-03-17 (×19): 25 mg via ORAL
  Filled 2021-03-08: qty 8
  Filled 2021-03-08 (×2): qty 1
  Filled 2021-03-08: qty 2
  Filled 2021-03-08 (×14): qty 1
  Filled 2021-03-08: qty 8

## 2021-03-08 MED ORDER — ONDANSETRON HCL 4 MG/2ML IJ SOLN: 4.0000 mg | Freq: Four times a day (QID) | INTRAMUSCULAR | Status: DC | PRN

## 2021-03-08 MED ORDER — SACUBITRIL-VALSARTAN 24-26 MG PO TABS: 1.0000 | ORAL_TABLET | Freq: Two times a day (BID) | ORAL | Status: DC

## 2021-03-08 MED ORDER — ACETAMINOPHEN 325 MG PO TABS
650.0000 mg | ORAL_TABLET | ORAL | Status: DC | PRN
  Filled 2021-03-08: qty 2

## 2021-03-08 MED ORDER — HYDRALAZINE HCL 50 MG PO TABS
50.0000 mg | ORAL_TABLET | Freq: Three times a day (TID) | ORAL | Status: DC
  Administered 2021-03-08 – 2021-03-17 (×26): 50 mg via ORAL
  Filled 2021-03-08 (×27): qty 1

## 2021-03-08 MED ORDER — POLYETHYLENE GLYCOL 3350 17 G PO PACK
17.0000 g | PACK | Freq: Every day | ORAL | Status: DC
  Administered 2021-03-09 – 2021-03-10 (×2): 17 g via ORAL
  Filled 2021-03-08 (×8): qty 1

## 2021-03-08 MED ORDER — SODIUM CHLORIDE 0.9 % IV SOLN: 250.0000 mL | INTRAVENOUS | Status: DC | PRN

## 2021-03-08 MED ORDER — FUROSEMIDE 10 MG/ML IJ SOLN
40.0000 mg | Freq: Once | INTRAMUSCULAR | Status: AC
  Administered 2021-03-08: 40 mg via INTRAVENOUS
  Filled 2021-03-08: qty 4

## 2021-03-08 MED ORDER — METFORMIN HCL 500 MG PO TABS: 500.0000 mg | ORAL_TABLET | Freq: Two times a day (BID) | ORAL | Status: DC

## 2021-03-08 MED ORDER — FUROSEMIDE 10 MG/ML IJ SOLN: 40.0000 mg | Freq: Two times a day (BID) | INTRAMUSCULAR | Status: DC

## 2021-03-08 MED ORDER — GLIPIZIDE ER 10 MG PO TB24
10.0000 mg | ORAL_TABLET | Freq: Every day | ORAL | Status: DC
  Filled 2021-03-08: qty 1

## 2021-03-08 MED ORDER — ISOSORBIDE MONONITRATE ER 60 MG PO TB24
60.0000 mg | ORAL_TABLET | Freq: Every day | ORAL | Status: DC
  Administered 2021-03-08 – 2021-03-17 (×10): 60 mg via ORAL
  Filled 2021-03-08 (×2): qty 1
  Filled 2021-03-08: qty 2
  Filled 2021-03-08 (×6): qty 1
  Filled 2021-03-08: qty 2

## 2021-03-08 MED ORDER — CLOPIDOGREL BISULFATE 75 MG PO TABS
75.0000 mg | ORAL_TABLET | Freq: Every day | ORAL | Status: DC
  Administered 2021-03-08 – 2021-03-17 (×10): 75 mg via ORAL
  Filled 2021-03-08 (×11): qty 1

## 2021-03-08 MED ORDER — ATORVASTATIN CALCIUM 80 MG PO TABS
80.0000 mg | ORAL_TABLET | Freq: Every day | ORAL | Status: DC
  Administered 2021-03-08 – 2021-03-17 (×10): 80 mg via ORAL
  Filled 2021-03-08 (×3): qty 1
  Filled 2021-03-08: qty 2
  Filled 2021-03-08 (×6): qty 1

## 2021-03-08 MED ORDER — SODIUM CHLORIDE 0.9% FLUSH: 3.0000 mL | INTRAVENOUS | Status: DC | PRN

## 2021-03-08 MED ORDER — HYDRALAZINE HCL 25 MG PO TABS
50.0000 mg | ORAL_TABLET | Freq: Once | ORAL | Status: AC
  Administered 2021-03-08: 50 mg via ORAL
  Filled 2021-03-08: qty 2

## 2021-03-08 MED ORDER — ASPIRIN EC 81 MG PO TBEC
81.0000 mg | DELAYED_RELEASE_TABLET | Freq: Every day | ORAL | Status: DC
  Administered 2021-03-08 – 2021-03-10 (×3): 81 mg via ORAL
  Filled 2021-03-08 (×3): qty 1

## 2021-03-08 MED ORDER — DOCUSATE SODIUM 100 MG PO CAPS
100.0000 mg | ORAL_CAPSULE | Freq: Two times a day (BID) | ORAL | Status: DC
  Administered 2021-03-08 – 2021-03-15 (×12): 100 mg via ORAL
  Filled 2021-03-08 (×14): qty 1

## 2021-03-08 MED ORDER — INSULIN ASPART 100 UNIT/ML IJ SOLN
0.0000 [IU] | Freq: Three times a day (TID) | INTRAMUSCULAR | Status: DC
  Administered 2021-03-09: 11 [IU] via SUBCUTANEOUS
  Administered 2021-03-09 – 2021-03-10 (×2): 3 [IU] via SUBCUTANEOUS
  Administered 2021-03-10: 2 [IU] via SUBCUTANEOUS
  Administered 2021-03-10: 8 [IU] via SUBCUTANEOUS

## 2021-03-08 MED ORDER — ENOXAPARIN SODIUM 40 MG/0.4ML IJ SOSY
40.0000 mg | PREFILLED_SYRINGE | INTRAMUSCULAR | Status: DC
  Administered 2021-03-10 – 2021-03-12 (×3): 40 mg via SUBCUTANEOUS
  Filled 2021-03-08 (×10): qty 0.4

## 2021-03-08 MED ORDER — EMPAGLIFLOZIN 10 MG PO TABS
10.0000 mg | ORAL_TABLET | Freq: Every day | ORAL | Status: DC
  Administered 2021-03-09 – 2021-03-17 (×9): 10 mg via ORAL
  Filled 2021-03-08 (×11): qty 1

## 2021-03-08 MED ORDER — GABAPENTIN 100 MG PO CAPS
100.0000 mg | ORAL_CAPSULE | Freq: Three times a day (TID) | ORAL | Status: DC
  Administered 2021-03-08 – 2021-03-11 (×11): 100 mg via ORAL
  Filled 2021-03-08 (×11): qty 1

## 2021-03-08 MED ORDER — FUROSEMIDE 10 MG/ML IJ SOLN
60.0000 mg | Freq: Two times a day (BID) | INTRAMUSCULAR | Status: DC
  Administered 2021-03-08 – 2021-03-11 (×7): 60 mg via INTRAVENOUS
  Filled 2021-03-08 (×8): qty 6

## 2021-03-08 MED ORDER — SODIUM CHLORIDE 0.9% FLUSH
3.0000 mL | Freq: Two times a day (BID) | INTRAVENOUS | Status: DC
  Administered 2021-03-08 – 2021-03-11 (×6): 3 mL via INTRAVENOUS

## 2021-03-08 MED ORDER — INSULIN GLARGINE-YFGN 100 UNIT/ML ~~LOC~~ SOLN
15.0000 [IU] | Freq: Every day | SUBCUTANEOUS | Status: DC
  Administered 2021-03-09 – 2021-03-17 (×9): 15 [IU] via SUBCUTANEOUS
  Filled 2021-03-08 (×10): qty 0.15

## 2021-03-08 MED ORDER — SACUBITRIL-VALSARTAN 24-26 MG PO TABS
1.0000 | ORAL_TABLET | Freq: Two times a day (BID) | ORAL | Status: DC
  Administered 2021-03-08 – 2021-03-12 (×8): 1 via ORAL
  Filled 2021-03-08 (×9): qty 1

## 2021-03-08 NOTE — ED Triage Notes (Signed)
Pt here via [facility] from home for sob x 2 weeks.  Hx of CHF.  Increased LE and abd swelling.

## 2021-03-08 NOTE — ED Provider Notes (Signed)
Emergency Medicine Provider Triage Evaluation Note  Thomas West , a 60 y.o. male  was evaluated in triage.  Pt complains of SOB.  Worsening swelling from feet up to abdomen over the past 2 weeks.  Now feeling more SOB.  Also hyperglycemic.  CBG 400's with EMS.  States he has been rationing his insulin and other medications at home recently.  Review of Systems  Positive: LE edema, SOB Negative: fever  Physical Exam  BP (!) 183/154 (BP Location: Left Arm)   Pulse (!) 105   Temp 97.8 F (36.6 C)   Resp 18   SpO2 97%   Gen:   Awake, no distress   Resp:  Normal effort  MSK:   Moves extremities without difficulty  Other:  2+ edema BLE, grossly symmetric without erythema/induration  Medical Decision Making  Medically screening exam initiated at 2:45 AM.  Appropriate orders placed.  Thomas West was informed that the remainder of the evaluation will be completed by another provider, this initial triage assessment does not replace that evaluation, and the importance of remaining in the ED until their evaluation is complete.  LE edema and SOB.  2+ pitting edema of the legs.  Also hyperglycemic as he has been rationing his medications at home.  EKG, labs, CXR.   Garlon Hatchet, PA-C 03/08/21 0248    Nira Conn, MD 03/08/21 [phone]

## 2021-03-08 NOTE — ED Notes (Signed)
Admitting MD at the bedside.  

## 2021-03-08 NOTE — ED Notes (Signed)
Pt sitting on side of bed, eating lunch. Pt refusing any "needles", does not want CBG checked, refused insulin and lovenox.

## 2021-03-08 NOTE — Progress Notes (Signed)
PROGRESS NOTE  Thomas West XMI:[id number] DOB: 1960/09/21   PCP: [name], [name], MD  Patient is from: Home  DOA: 03/08/2021 LOS: 0  Chief complaints:  Chief Complaint  Patient presents with   Shortness of Breath     Brief Narrative / Interim history: 60 year old male with history of systolic CHF, CAD, DM-2, HTN, CVA and CKD-3A presenting with progressive edema, orthopnea and dyspnea on exertion for about 2 weeks.  Patient moved from [place] to [place] about a year ago.  He has not established care with PCP, and has not been able to fill his medications.  He is not sure about taking diuretics.  He also admits to taking ibuprofen twice daily.  Hypertensive to 180/120s.  BNP elevated to 3600.  Admitted for acute on chronic systolic CHF with marked anasarca, and started on IV Lasix.  Subjective: Seen and examined earlier this morning.  No major events this morning.  Reports improvement in his breathing and orthopnea.  Still with marked edema and DOE.  He denies chest pain, GI or UTI symptoms.  He already has 2 full urinals at bedside.  Objective: Vitals:   03/08/21 1045 03/08/21 1100 03/08/21 1115 03/08/21 1130  BP: (!) 153/106 (!) 149/117 (!) 169/117 (!) 165/115  Pulse: 93 96 93 100  Resp: 14 (!) 25 (!) 24 (!) 22  Temp:      SpO2: 95% 94% 96% 97%  Height:        Intake/Output Summary (Last 24 hours) at 03/08/2021 1228 Last data filed at 03/08/2021 8250 Gross per 24 hour  Intake --  Output 2100 ml  Net -2100 ml   There were no vitals filed for this visit.  Examination:  GENERAL: No apparent distress.  Nontoxic. HEENT: MMM.  Vision and hearing grossly intact.  NECK: Supple.  Prominent JVD to his jaw even sitting upright. RESP:  No IWOB.  Fair aeration bilaterally. CVS:  RRR. Heart sounds normal.  ABD/GI/GU: BS+. Abd soft, NTND.  MSK/EXT:  Moves extremities. No apparent deformity.  2+ BLE edema bilaterally. SKIN: no apparent skin lesion or wound NEURO: Awake,  alert and oriented appropriately.  No apparent focal neuro deficit. PSYCH: Calm. Normal affect.   Procedures:  None  Microbiology summarized: COVID-19 and influenza PCR nonreactive.  Assessment & Plan: Acute on chronic systolic CHF: Unknown EF.  Not able to access his echocardiogram from [place].  Exacerbation likely due to noncompliance with medication NSAID use.  Elevated BNP to 3600.  Still with prominent JVD and marked edema.  He might have right-sided heart failure.  BP improving. -Increase IV Lasix to 60 mg twice daily -GDMT-Coreg, BiDil, Entresto and Jardiance -Monitor fluid status, renal functions and electrolytes -Follow echocardiogram -Sodium and fluid restrictions -Advised to avoid NSAIDs. -Cardiology consult   Hypertensive urgency: DBP as high as 140s in ED.  Improving. -Cardiac meds as above  History of CAD: No chest pain. -Cardiac meds as above -Continue home Plavix and aspirin  Uncontrolled NIDDM-2 with hyperglycemia CKD-3 Recent Labs  Lab 03/08/21 0801  GLUCAP 248*   -Continue SSI-moderate -Continue basal at 15 units -Add mealtime coverage at 6 units -Continue statin  CKD-3A: Stable Recent Labs    01/02/21 1308 02/03/21 0911 03/08/21 0258  BUN 18 15 20   CREATININE 1.38* 1.46 1.43*  -Continue monitoring  History of CVA without residual deficit -Continue Plavix, aspirin and statin for now  Body mass index is 23.78 kg/m.         DVT prophylaxis:  enoxaparin (LOVENOX) injection 40 mg Start: 03/08/21 1000  Code Status: Full code Family Communication: Patient and/or RN. Available if any question.  Level of care: Telemetry Cardiac Status is: Observation  The patient will require care spanning > 2 midnights and should be moved to inpatient because: Due to significant fluid overload with prominent JVD and bilateral lower extremity edema.  Patient needs aggressive IV diuretics      Consultants:  Cardiology   Sch Meds:  Scheduled Meds:   aspirin EC  81 mg Oral Daily   atorvastatin  80 mg Oral Daily   carvedilol  25 mg Oral BID   clopidogrel  75 mg Oral Daily   docusate sodium  100 mg Oral Q12H   empagliflozin  10 mg Oral QAC breakfast   enoxaparin (LOVENOX) injection  40 mg Subcutaneous Q24H   furosemide  60 mg Intravenous BID   gabapentin  100 mg Oral TID   hydrALAZINE  50 mg Oral TID   insulin aspart  0-15 Units Subcutaneous TID WC   insulin glargine-yfgn  15 Units Subcutaneous Daily   isosorbide mononitrate  60 mg Oral Daily   polyethylene glycol  17 g Oral Daily   sacubitril-valsartan  1 tablet Oral BID   sodium chloride flush  3 mL Intravenous Q12H   Continuous Infusions:  sodium chloride     PRN Meds:.sodium chloride, acetaminophen, ondansetron (ZOFRAN) IV, sodium chloride flush  Antimicrobials: Anti-infectives (From admission, onward)    None        I have personally reviewed the following labs and images: CBC: Recent Labs  Lab 03/08/21 0258 03/08/21 0316  WBC 7.1  --   NEUTROABS 4.1  --   HGB 15.2 17.0  HCT 48.4 50.0  MCV 88.2  --   PLT 310  --    BMP &GFR Recent Labs  Lab 03/08/21 0258 03/08/21 0316 03/08/21 0527  NA 137 140  --   K 4.5 4.4  --   CL 100  --   --   CO2 26  --   --   GLUCOSE 298*  --   --   BUN 20  --   --   CREATININE 1.43*  --   --   CALCIUM 8.6*  --   --   MG  --   --  2.0   CrCl cannot be calculated (Unknown ideal weight.). Liver & Pancreas: No results for input(s): AST, ALT, ALKPHOS, BILITOT, PROT, ALBUMIN in the last 168 hours. No results for input(s): LIPASE, AMYLASE in the last 168 hours. No results for input(s): AMMONIA in the last 168 hours. Diabetic: No results for input(s): HGBA1C in the last 72 hours. Recent Labs  Lab 03/08/21 0801  GLUCAP 248*   Cardiac Enzymes: No results for input(s): CKTOTAL, CKMB, CKMBINDEX, TROPONINI in the last 168 hours. Recent Labs    02/03/21 0911  PROBNP 3,012.0*   Coagulation Profile: No results for  input(s): INR, PROTIME in the last 168 hours. Thyroid Function Tests: No results for input(s): TSH, T4TOTAL, FREET4, T3FREE, THYROIDAB in the last 72 hours. Lipid Profile: No results for input(s): CHOL, HDL, LDLCALC, TRIG, CHOLHDL, LDLDIRECT in the last 72 hours. Anemia Panel: No results for input(s): VITAMINB12, FOLATE, FERRITIN, TIBC, IRON, RETICCTPCT in the last 72 hours. Urine analysis:    Component Value Date/Time   COLORURINE YELLOW 03/08/2021 0246   APPEARANCEUR HAZY (A) 03/08/2021 0246   LABSPEC 1.019 03/08/2021 0246   PHURINE 5.0 03/08/2021 0246   GLUCOSEU 150 (  A) 03/08/2021 0246   HGBUR NEGATIVE 03/08/2021 0246   BILIRUBINUR NEGATIVE 03/08/2021 0246   BILIRUBINUR negative 01/02/2021 1312   KETONESUR NEGATIVE 03/08/2021 0246   PROTEINUR >=300 (A) 03/08/2021 0246   UROBILINOGEN 0.2 01/02/2021 1312   NITRITE NEGATIVE 03/08/2021 0246   LEUKOCYTESUR NEGATIVE 03/08/2021 0246   Sepsis Labs: Invalid input(s): PROCALCITONIN, LACTICIDVEN  Microbiology: Recent Results (from the past 240 hour(s))  Resp Panel by RT-PCR (Flu A&B, Covid) Nasopharyngeal Swab     Status: None   Collection Time: 03/08/21  6:48 AM   Specimen: Nasopharyngeal Swab; Nasopharyngeal(NP) swabs in vial transport medium  Result Value Ref Range Status   SARS Coronavirus 2 by RT PCR NEGATIVE NEGATIVE Final    Comment: (NOTE) SARS-CoV-2 target nucleic acids are NOT DETECTED.  The SARS-CoV-2 RNA is generally detectable in upper respiratory specimens during the acute phase of infection. The lowest concentration of SARS-CoV-2 viral copies this assay can detect is 138 copies/mL. A negative result does not preclude SARS-Cov-2 infection and should not be used as the sole basis for treatment or other patient management decisions. A negative result may occur with  improper specimen collection/handling, submission of specimen other than nasopharyngeal swab, presence of viral mutation(s) within the areas targeted by  this assay, and inadequate number of viral copies(<138 copies/mL). A negative result must be combined with clinical observations, patient history, and epidemiological information. The expected result is Negative.  Fact Sheet for Patients:  BloggerCourse.com  Fact Sheet for Healthcare Providers:  SeriousBroker.it  This test is no t yet approved or cleared by the [place] FDA and  has been authorized for detection and/or diagnosis of SARS-CoV-2 by FDA under an Emergency Use Authorization (EUA). This EUA will remain  in effect (meaning this test can be used) for the duration of the COVID-19 declaration under Section 564(b)(1) of the Act, 21 U.S.C.section 360bbb-3(b)(1), unless the authorization is terminated  or revoked sooner.       Influenza A by PCR NEGATIVE NEGATIVE Final   Influenza B by PCR NEGATIVE NEGATIVE Final    Comment: (NOTE) The Xpert Xpress SARS-CoV-2/FLU/RSV plus assay is intended as an aid in the diagnosis of influenza from Nasopharyngeal swab specimens and should not be used as a sole basis for treatment. Nasal washings and aspirates are unacceptable for Xpert Xpress SARS-CoV-2/FLU/RSV testing.  Fact Sheet for Patients: BloggerCourse.com  Fact Sheet for Healthcare Providers: SeriousBroker.it  This test is not yet approved or cleared by the [place] FDA and has been authorized for detection and/or diagnosis of SARS-CoV-2 by FDA under an Emergency Use Authorization (EUA). This EUA will remain in effect (meaning this test can be used) for the duration of the COVID-19 declaration under Section 564(b)(1) of the Act, 21 U.S.C. section 360bbb-3(b)(1), unless the authorization is terminated or revoked.  Performed at [facility], [address], [place], [place] [address]     Radiology Studies: DG Chest 2 View  Result Date: 03/08/2021 CLINICAL  DATA:  Shortness of breath EXAM: CHEST - 2 VIEW COMPARISON:  02/06/2018 FINDINGS: Cardiac shadow is enlarged but stable. Aortic calcifications are seen. Small pleural effusions are noted bilaterally. Focal airspace opacity is noted in the medial right lung base projecting in the right lower lobe. No bony abnormality is noted. IMPRESSION: Right lower lobe infiltrate with small pleural effusions bilaterally. Electronically Signed   By: Alcide Clever M.D.   On: 03/08/2021 03:31       Enaya Howze T. Treyden Hakim [facility]  If 7PM-7AM,  please contact night-coverage [email or web address] 03/08/2021, 12:28 PM

## 2021-03-08 NOTE — ED Provider Notes (Signed)
[facility] EMERGENCY DEPARTMENT Provider Note  CSN: [id number] Arrival date & time: 03/08/21 0242  Chief Complaint(s) Shortness of Breath  HPI Thomas West is a 60 y.o. male with a past medical history listed below who presents to the emergency department with 2 weeks of worsening peripheral edema.  Edema extends from the lower extremities up to the abdomen.  He has associated dyspnea on exertion.  He denies any associated chest pain.  No recent fevers or infections.  No coughing or congestion.  Patient reports that he moved down from [place] almost a year ago and has not established care with a primary care provider yet.  Reports that he has been rationing his blood pressure, heart failure, diabetes medicine since then.  He has seen in urgent care who was refilled some of his meds after running out.   Shortness of Breath  Past Medical History Past Medical History:  Diagnosis Date   CHF (congestive heart failure) (HCC)    Coronary artery disease    Diabetes mellitus without complication (HCC)    History of MI (myocardial infarction) 03/05/2019   Hypertension    Stroke [facility])    Patient Active Problem List   Diagnosis Date Noted   Acute on chronic HFrEF (heart failure with reduced ejection fraction) (HCC) 03/08/2021   HFrEF (heart failure with reduced ejection fraction) (HCC) 02/03/2021   Peripheral neuropathy 02/03/2021   Diabetes mellitus (HCC) 03/27/2019   Hyperlipidemia associated with type 2 diabetes mellitus (HCC) 03/27/2019   CAD (coronary artery disease) 03/05/2019   History of MI (myocardial infarction) 03/05/2019   Type 2 diabetes mellitus with diabetic neuropathy, unspecified (HCC) 03/05/2019   CKD (chronic kidney disease), stage II 03/05/2019   HLD (hyperlipidemia) 03/05/2019   GERD (gastroesophageal reflux disease) 03/05/2019   Hypertension, essential, benign 03/05/2019   Home Medication(s) Prior to Admission medications   Medication Sig Start  Date End Date Taking? Authorizing Provider  aspirin EC 81 MG tablet Take 1 tablet (81 mg total) by mouth daily. 01/02/21   Wieters, Hallie C, PA-C  atorvastatin (LIPITOR) 80 MG tablet Take 1 tablet (80 mg total) by mouth daily. 02/03/21   [name], [name], MD  carvedilol (COREG) 25 MG tablet TAKE ONE TABLET BY MOUTH EVERY 12 HOURS (10AM &10PM) Patient taking differently: Take 25 mg by mouth in the morning and at bedtime. 02/03/21   [name], [name], MD  clopidogrel (PLAVIX) 75 MG tablet Take 1 tablet (75 mg total) by mouth daily. 02/03/21   [name], [name], MD  docusate sodium (COLACE) 100 MG capsule Take 1 capsule (100 mg total) by mouth every 12 (twelve) hours. 01/02/21   Wieters, Hallie C, PA-C  empagliflozin (JARDIANCE) 10 MG TABS tablet Take 1 tablet (10 mg total) by mouth daily before breakfast. 02/09/21   [name], [name], MD  furosemide (LASIX) 20 MG tablet Take 1 tablet (20 mg total) by mouth daily. 02/03/21   [name], [name], MD  gabapentin (NEURONTIN) 100 MG capsule Take 1 capsule (100 mg total) by mouth 3 (three) times daily. 02/03/21   [name], [name], MD  glipiZIDE (GLUCOTROL XL) 10 MG 24 hr tablet Take 1 tablet (10 mg total) by mouth daily with breakfast. 01/02/21   Wieters, Hallie C, PA-C  hydrALAZINE (APRESOLINE) 50 MG tablet TAKE 1 TABLET(50 MG) BY MOUTH THREE TIMES DAILY Patient taking differently: Take 50 mg by mouth 3 (three) times daily. 02/04/21   [name], [name], MD  Insulin Glargine [facility]) 100  UNIT/ML Inject 15 Units into the skin daily. 02/09/21   [name], [name], MD  Insulin Pen Needle (PEN NEEDLES) 32G X 4 MM MISC To use with insulin pen. 02/09/21   [name], [name], MD  isosorbide mononitrate (IMDUR) 60 MG 24 hr tablet Take 1 tablet (60 mg total) by mouth daily. 01/02/21   Wieters, Hallie C, PA-C  metFORMIN (GLUCOPHAGE) 500 MG tablet Take 1 tablet (500 mg total) by mouth 2 (two) times daily with a meal. 02/03/21   [name], [name], MD  polyethylene glycol (MIRALAX / GLYCOLAX)  17 g packet Take 17 g by mouth daily. 01/02/21   Wieters, Hallie C, PA-C  sacubitril-valsartan (ENTRESTO) 24-26 MG Take 1 tablet by mouth 2 (two) times daily. 02/03/21   [name], [name], MD                                                                                                                                    Past Surgical History Past Surgical History:  Procedure Laterality Date   leg surgery     "pins in left shin"   Family History No family history on file.  Social History Social History   Tobacco Use   Smoking status: Former    Types: Cigarettes    Quit date: 10/06/2017    Years since quitting: 3.4   Smokeless tobacco: Never  Substance Use Topics   Alcohol use: Not Currently   Drug use: Yes    Types: Marijuana   Allergies Bee venom  Review of Systems Review of Systems  Respiratory:  Positive for shortness of breath.   All other systems are reviewed and are negative for acute change except as noted in the HPI  Physical Exam Vital Signs  I have reviewed the triage vital signs BP (!) 168/145   Pulse (!) 106   Temp 97.8 F (36.6 C)   Resp 19   Ht 5\' 9"  (1.753 m)   SpO2 99%   BMI 23.78 kg/m   Physical Exam Vitals reviewed.  Constitutional:      General: He is not in acute distress.    Appearance: He is well-developed. He is not diaphoretic.  HENT:     Head: Normocephalic and atraumatic.     Nose: Nose normal.  Eyes:     General: No scleral icterus.       Right eye: No discharge.        Left eye: No discharge.     Conjunctiva/sclera: Conjunctivae normal.     Pupils: Pupils are equal, round, and reactive to light.  Cardiovascular:     Rate and Rhythm: Regular rhythm. Tachycardia present.     Heart sounds: No murmur heard.   No friction rub. No gallop.     Comments: Edema extends up to abdomen Pulmonary:     Effort: Pulmonary effort is normal. No respiratory distress.     Breath sounds: Normal breath  sounds. No stridor. No rales.  Abdominal:      General: There is no distension.     Palpations: Abdomen is soft.     Tenderness: There is no abdominal tenderness.  Musculoskeletal:        General: No tenderness.     Cervical back: Normal range of motion and neck supple.     Right lower leg: 2+ Pitting Edema present.     Left lower leg: 2+ Pitting Edema present.  Skin:    General: Skin is warm and dry.     Findings: No erythema or rash.  Neurological:     Mental Status: He is alert and oriented to person, place, and time.    ED Results and Treatments Labs (all labs ordered are listed, but only abnormal results are displayed) Labs Reviewed  BASIC METABOLIC PANEL - Abnormal; Notable for the following components:      Result Value   Glucose, Bld 298 (*)    Creatinine, Ser 1.43 (*)    Calcium 8.6 (*)    GFR, Estimated 57 (*)    All other components within normal limits  BRAIN NATRIURETIC PEPTIDE - Abnormal; Notable for the following components:   B Natriuretic Peptide 3,620.7 (*)    All other components within normal limits  URINALYSIS, ROUTINE W REFLEX MICROSCOPIC - Abnormal; Notable for the following components:   APPearance HAZY (*)    Glucose, UA 150 (*)    Protein, ur >=300 (*)    Bacteria, UA RARE (*)    All other components within normal limits  I-STAT VENOUS BLOOD GAS, ED - Abnormal; Notable for the following components:   Bicarbonate 30.6 (*)    Acid-Base Excess 3.0 (*)    Calcium, Ion 1.08 (*)    All other components within normal limits  TROPONIN I (HIGH SENSITIVITY) - Abnormal; Notable for the following components:   Troponin I (High Sensitivity) 20 (*)    All other components within normal limits  RESP PANEL BY RT-PCR (FLU A&B, COVID) ARPGX2  CBC WITH DIFFERENTIAL/PLATELET  MAGNESIUM  TROPONIN I (HIGH SENSITIVITY)                                                                                                                         EKG  EKG Interpretation  Date/Time:  Sunday March 08 2021 02:48:40  EDT Ventricular Rate:  111 PR Interval:  166 QRS Duration: 76 QT Interval:  336 QTC Calculation: 456 R Axis:   129 Text Interpretation: Sinus tachycardia with occasional Premature ventricular complexes Right axis deviation Abnormal ECG Otherwise no significant change Confirmed by Drema Pry [phone]) on 03/08/2021 5:36:10 AM       Radiology DG Chest 2 View  Result Date: 03/08/2021 CLINICAL DATA:  Shortness of breath EXAM: CHEST - 2 VIEW COMPARISON:  02/06/2018 FINDINGS: Cardiac shadow is enlarged but stable. Aortic calcifications are seen. Small pleural effusions are noted bilaterally. Focal airspace opacity is noted in the medial right lung base  projecting in the right lower lobe. No bony abnormality is noted. IMPRESSION: Right lower lobe infiltrate with small pleural effusions bilaterally. Electronically Signed   By: Alcide Clever M.D.   On: 03/08/2021 03:31    Pertinent labs & imaging results that were available during my care of the patient were reviewed by me and considered in my medical decision making (see MDM for details).  Medications Ordered in ED Medications  furosemide (LASIX) injection 40 mg (has no administration in time range)  hydrALAZINE (APRESOLINE) tablet 50 mg (has no administration in time range)                                                                                                                                     Procedures .1-3 Lead EKG Interpretation Performed by: Nira Conn, MD Authorized by: Nira Conn, MD     Interpretation: abnormal     ECG rate:  101   ECG rate assessment: tachycardic     Rhythm: sinus tachycardia     Ectopy: PVCs     Conduction: normal   .Critical Care E&M Performed by: Nira Conn, MD  Critical care provider statement:    Critical care time (minutes):  45   Critical care time was exclusive of:  Separately billable procedures and treating other patients   Critical care was  necessary to treat or prevent imminent or life-threatening deterioration of the following conditions:  Cardiac failure   Critical care was time spent personally by me on the following activities:  Development of treatment plan with patient or surrogate, discussions with consultants, examination of patient, ordering and performing treatments and interventions, ordering and review of laboratory studies, ordering and review of radiographic studies, pulse oximetry, re-evaluation of patient's condition, review of old charts and obtaining history from patient or surrogate   Care discussed with: admitting provider   After initial E/M assessment, critical care services were subsequently performed that were exclusive of separately billable procedures or treatment.    (including critical care time)  Medical Decision Making / ED Course I have reviewed the nursing notes for this encounter and the patient's prior records (if available in EHR or on provided paperwork).  Thomas West was evaluated in Emergency Department on 03/08/2021 for the symptoms described in the history of present illness. He was evaluated in the context of the global COVID-19 pandemic, which necessitated consideration that the patient might be at risk for infection with the SARS-CoV-2 virus that causes COVID-19. Institutional protocols and algorithms that pertain to the evaluation of patients at risk for COVID-19 are in a state of rapid change based on information released by regulatory bodies including the CDC and federal and state organizations. These policies and algorithms were followed during the patient's care in the ED.     Patient presents with worsening peripheral edema and dyspnea on exertion consistent with heart failure exacerbation. Patient is new to the  area and has yet to establish care with a primary care provider.  He is scheduled to see Dr. [name] in the next several weeks.  Work-up confirms heart failure exacerbation  with a BNP greater than 3500. Patient is also significantly hypertensive with diastolic blood pressures in the 140s. Patient given IV Lasix and oral hydralazine. Will need to be admitted for further management.  Pertinent labs & imaging results that were available during my care of the patient were reviewed by me and considered in my medical decision making:    Final Clinical Impression(s) / ED Diagnoses Final diagnoses:  Acute on chronic congestive heart failure, unspecified heart failure type [facility])     This chart was dictated using voice recognition software.  Despite best efforts to proofread,  errors can occur which can change the documentation meaning.    Nira Conn, MD 03/08/21 [phone]

## 2021-03-08 NOTE — H&P (Signed)
History and Physical    Thomas West FYT:[id number] DOB: 1960/12/27 DOA: 03/08/2021  PCP: [name], [name], MD  Patient coming from: Home  I have personally briefly reviewed patient's old medical records in [facility]  Chief Complaint: SOB  HPI: Thomas West is a 60 y.o. male with medical history significant of HFrEF, prior MI, DM2, HTN.  Pt presents to ED with 2+ weeks of worsening peripheral edema, anasarca.  Now up to abdomen.  Associated orthopnea, DOE, no CP.  Has been rationing home meds for HTN, CHF, and DM2.  Hasnt established care with PCP in area.  No CP, no abd pain.  Denies any cocaine use (admits to marijuana but that's all).   ED Course: BP running 180/120s, Diastolics as high as 140s in ED.  Creat 1.4 (baseline).  CXR showing B pleural effusions and RLL infiltrate.  Though no fever nor WBC to suggest infection.  His BNP is 3600.  A1C last month was >14.  UA showing proteinuria.  Given Hydralazine 50mg  PO and Lasix 40mg  IV in ED.   Review of Systems: As per HPI, otherwise all review of systems negative.  Past Medical History:  Diagnosis Date   CHF (congestive heart failure) (HCC)    Coronary artery disease    Diabetes mellitus without complication (HCC)    History of MI (myocardial infarction) 03/05/2019   Hypertension    Stroke [facility])     Past Surgical History:  Procedure Laterality Date   leg surgery     "pins in left shin"     reports that he quit smoking about 3 years ago. His smoking use included cigarettes. He has never used smokeless tobacco. He reports that he does not currently use alcohol. He reports current drug use. Drug: Marijuana.  Allergies  Allergen Reactions   Bee Venom     swelling    No family history on file. FHx positive for early onset CAD.  Prior to Admission medications   Medication Sig Start Date End Date Taking? Authorizing Provider  aspirin EC 81 MG tablet Take 1 tablet (81 mg total) by mouth daily.  01/02/21  Yes Wieters, Hallie C, PA-C  atorvastatin (LIPITOR) 80 MG tablet Take 1 tablet (80 mg total) by mouth daily. 02/03/21   03/04/21, [name], MD  carvedilol (COREG) 25 MG tablet TAKE ONE TABLET BY MOUTH EVERY 12 HOURS (10AM &10PM) Patient taking differently: Take 25 mg by mouth in the morning and at bedtime. 02/03/21   [name], [name], MD  clopidogrel (PLAVIX) 75 MG tablet Take 1 tablet (75 mg total) by mouth daily. 02/03/21   [name], [name], MD  docusate sodium (COLACE) 100 MG capsule Take 1 capsule (100 mg total) by mouth every 12 (twelve) hours. 01/02/21   Wieters, Hallie C, PA-C  empagliflozin (JARDIANCE) 10 MG TABS tablet Take 1 tablet (10 mg total) by mouth daily before breakfast. 02/09/21   03/04/21, [name], MD  furosemide (LASIX) 20 MG tablet Take 1 tablet (20 mg total) by mouth daily. 02/03/21   [name], [name], MD  gabapentin (NEURONTIN) 100 MG capsule Take 1 capsule (100 mg total) by mouth 3 (three) times daily. 02/03/21   [name], [name], MD  glipiZIDE (GLUCOTROL XL) 10 MG 24 hr tablet Take 1 tablet (10 mg total) by mouth daily with breakfast. 01/02/21   Wieters, Hallie C, PA-C  hydrALAZINE (APRESOLINE) 50 MG tablet TAKE 1 TABLET(50 MG) BY MOUTH THREE TIMES DAILY Patient taking differently: Take 50 mg by  mouth 3 (three) times daily. 02/04/21   [name], [name], MD  Insulin Glargine [facility]) 100 UNIT/ML Inject 15 Units into the skin daily. 02/09/21   [name], [name], MD  Insulin Pen Needle (PEN NEEDLES) 32G X 4 MM MISC To use with insulin pen. 02/09/21   [name], [name], MD  isosorbide mononitrate (IMDUR) 60 MG 24 hr tablet Take 1 tablet (60 mg total) by mouth daily. 01/02/21   Wieters, Hallie C, PA-C  metFORMIN (GLUCOPHAGE) 500 MG tablet Take 1 tablet (500 mg total) by mouth 2 (two) times daily with a meal. 02/03/21   [name], [name], MD  polyethylene glycol (MIRALAX / GLYCOLAX) 17 g packet Take 17 g by mouth daily. 01/02/21   Wieters, Hallie C, PA-C  sacubitril-valsartan (ENTRESTO) 24-26  MG Take 1 tablet by mouth 2 (two) times daily. 02/03/21   [name], [name], MD    Physical Exam: Vitals:   03/08/21 0244 03/08/21 0247 03/08/21 0530 03/08/21 0551  BP: (!) 183/154  (!) 168/145 (!) 186/126  Pulse: (!) 105  (!) 106   Resp: 18  19   Temp: 97.8 F (36.6 C)     SpO2: 97%  99%   Height:  5\' 9"  (1.753 m)      Constitutional: NAD, calm, comfortable Eyes: PERRL, lids and conjunctivae normal ENMT: Mucous membranes are moist. Posterior pharynx clear of any exudate or lesions.Normal dentition.  Neck: normal, supple, no masses, no thyromegaly Respiratory: clear to auscultation bilaterally, no wheezing, no crackles. Normal respiratory effort. No accessory muscle use.  Cardiovascular: Tachycardia. Anasarca present (2+ edema up to abdomen). Abdomen: no tenderness, no masses palpated. No hepatosplenomegaly. Bowel sounds positive.  Musculoskeletal: no clubbing / cyanosis. No joint deformity upper and lower extremities. Good ROM, no contractures. Normal muscle tone.  Skin: no rashes, lesions, ulcers. No induration Neurologic: CN 2-12 grossly intact. Sensation intact, DTR normal. Strength 5/5 in all 4.  Psychiatric: Normal judgment and insight. Alert and oriented x 3. Normal mood.    Labs on Admission: I have personally reviewed following labs and imaging studies  CBC: Recent Labs  Lab 03/08/21 0258 03/08/21 0316  WBC 7.1  --   NEUTROABS 4.1  --   HGB 15.2 17.0  HCT 48.4 50.0  MCV 88.2  --   PLT 310  --    Basic Metabolic Panel: Recent Labs  Lab 03/08/21 0258 03/08/21 0316  NA 137 140  K 4.5 4.4  CL 100  --   CO2 26  --   GLUCOSE 298*  --   BUN 20  --   CREATININE 1.43*  --   CALCIUM 8.6*  --    GFR: CrCl cannot be calculated (Unknown ideal weight.). Liver Function Tests: No results for input(s): AST, ALT, ALKPHOS, BILITOT, PROT, ALBUMIN in the last 168 hours. No results for input(s): LIPASE, AMYLASE in the last 168 hours. No results for input(s): AMMONIA in  the last 168 hours. Coagulation Profile: No results for input(s): INR, PROTIME in the last 168 hours. Cardiac Enzymes: No results for input(s): CKTOTAL, CKMB, CKMBINDEX, TROPONINI in the last 168 hours. BNP (last 3 results) Recent Labs    02/03/21 0911  PROBNP 3,012.0*   HbA1C: No results for input(s): HGBA1C in the last 72 hours. CBG: No results for input(s): GLUCAP in the last 168 hours. Lipid Profile: No results for input(s): CHOL, HDL, LDLCALC, TRIG, CHOLHDL, LDLDIRECT in the last 72 hours. Thyroid Function Tests: No results for input(s): TSH, T4TOTAL, FREET4,  T3FREE, THYROIDAB in the last 72 hours. Anemia Panel: No results for input(s): VITAMINB12, FOLATE, FERRITIN, TIBC, IRON, RETICCTPCT in the last 72 hours. Urine analysis:    Component Value Date/Time   COLORURINE YELLOW 03/08/2021 0246   APPEARANCEUR HAZY (A) 03/08/2021 0246   LABSPEC 1.019 03/08/2021 0246   PHURINE 5.0 03/08/2021 0246   GLUCOSEU 150 (A) 03/08/2021 0246   HGBUR NEGATIVE 03/08/2021 0246   BILIRUBINUR NEGATIVE 03/08/2021 0246   BILIRUBINUR negative 01/02/2021 1312   KETONESUR NEGATIVE 03/08/2021 0246   PROTEINUR >=300 (A) 03/08/2021 0246   UROBILINOGEN 0.2 01/02/2021 1312   NITRITE NEGATIVE 03/08/2021 0246   LEUKOCYTESUR NEGATIVE 03/08/2021 0246    Radiological Exams on Admission: DG Chest 2 View  Result Date: 03/08/2021 CLINICAL DATA:  Shortness of breath EXAM: CHEST - 2 VIEW COMPARISON:  02/06/2018 FINDINGS: Cardiac shadow is enlarged but stable. Aortic calcifications are seen. Small pleural effusions are noted bilaterally. Focal airspace opacity is noted in the medial right lung base projecting in the right lower lobe. No bony abnormality is noted. IMPRESSION: Right lower lobe infiltrate with small pleural effusions bilaterally. Electronically Signed   By: Alcide Clever M.D.   On: 03/08/2021 03:31    EKG: Independently reviewed.  Assessment/Plan Principal Problem:   Acute on chronic HFrEF  (heart failure with reduced ejection fraction) (HCC) Active Problems:   History of MI (myocardial infarction)   Type 2 diabetes mellitus with diabetic neuropathy, unspecified (HCC)   CKD (chronic kidney disease), stage II   HLD (hyperlipidemia)   Hypertension, essential, benign    Acute on chronic HFrEF - Due to rationing meds apparently, uncontrolled HTN. CHF pathway Lasix 40mg  IV BID first dose in ED Strict intake and output Get BP under control (see HTN below). Tele monitor 2d echo HTN - Resume hydralazine 50mg  PO TID first dose already given in ED Resume Imdur first dose now Resume Coreg later this AM Resume Entresto later this evening DM2 - very poor control with A1C > 14 Resume Lantus 15u daily Resume Jardiance Add Mod scale SSI AC Hold metformin and glipizide for the moment. CKD 3a - Daily BMP with diuresis and restarting of meds (especially monitor with entresto). HLD - Resume statin CAD - Resume ASA + Plavix  DVT prophylaxis: Lovenox Code Status: Full Family Communication: No family in room Disposition Plan: Home after CHF improved Consults called: None Admission status: Place in obs  Severity of Illness: The appropriate patient status for this patient is OBSERVATION. Observation status is judged to be reasonable and necessary in order to provide the required intensity of service to ensure the patient's safety. The patient's presenting symptoms, physical exam findings, and initial radiographic and laboratory data in the context of their medical condition is felt to place them at decreased risk for further clinical deterioration. Furthermore, it is anticipated that the patient will be medically stable for discharge from the hospital within 2 midnights of admission.   Patient has acute decompensated CHF: Patient presents with: PAROXYSMAL NOCTURNAL DYSPNEA, ORTHOPNEA, and dyspnea on exertion / increased shortness of breath Exam findings include: bilateral leg edema  and tachycardia Work up findings include: pleural effusion on CXR Patient is being treated with IV diuretics.   Shaivi Rothschild DO [facility]  How to contact the [facility] Attending or Consulting provider 7A - 7P or covering provider during after hours 7P -7A, for this patient?  Check the care team in [facility] and look for a) attending/consulting [facility] provider listed and b) the [facility]  team listed Log into [email or web address]  Amion Physician Scheduling and messaging for groups and whole hospitals  On call and physician scheduling software for group practices, residents, hospitalists and other medical providers for call, clinic, rotation and shift schedules. On[employer] is a hospital-wide system for scheduling doctors and paging doctors on call. EasyPlot is for scientific plotting and data analysis.  [email or web address]  and use [place]'s universal password to access. If you do not have the password, please contact the hospital operator.  Locate the [facility] provider you are looking for under [facility] and page to a number that you can be directly reached. If you still have difficulty reaching the provider, please page the [facility] (Director on Call) for the Hospitalists listed on amion for assistance.  03/08/2021, 6:02 AM

## 2021-03-08 NOTE — ED Notes (Signed)
Received pt ao x 4, NAD. States he feels better, no complaints at this time. Denies SOB/ chest pain.

## 2021-03-09 ENCOUNTER — Observation Stay (HOSPITAL_COMMUNITY): Payer: Medicare Other

## 2021-03-09 DIAGNOSIS — E1165 Type 2 diabetes mellitus with hyperglycemia: Secondary | ICD-10-CM | POA: Diagnosis present

## 2021-03-09 DIAGNOSIS — R531 Weakness: Secondary | ICD-10-CM | POA: Diagnosis not present

## 2021-03-09 DIAGNOSIS — G9341 Metabolic encephalopathy: Secondary | ICD-10-CM | POA: Diagnosis present

## 2021-03-09 DIAGNOSIS — T502X5A Adverse effect of carbonic-anhydrase inhibitors, benzothiadiazides and other diuretics, initial encounter: Secondary | ICD-10-CM | POA: Diagnosis not present

## 2021-03-09 DIAGNOSIS — E785 Hyperlipidemia, unspecified: Secondary | ICD-10-CM | POA: Diagnosis present

## 2021-03-09 DIAGNOSIS — I272 Pulmonary hypertension, unspecified: Secondary | ICD-10-CM | POA: Diagnosis present

## 2021-03-09 DIAGNOSIS — Z20822 Contact with and (suspected) exposure to covid-19: Secondary | ICD-10-CM | POA: Diagnosis present

## 2021-03-09 DIAGNOSIS — I5021 Acute systolic (congestive) heart failure: Secondary | ICD-10-CM

## 2021-03-09 DIAGNOSIS — R0602 Shortness of breath: Secondary | ICD-10-CM | POA: Diagnosis present

## 2021-03-09 DIAGNOSIS — I252 Old myocardial infarction: Secondary | ICD-10-CM | POA: Diagnosis not present

## 2021-03-09 DIAGNOSIS — E1169 Type 2 diabetes mellitus with other specified complication: Secondary | ICD-10-CM

## 2021-03-09 DIAGNOSIS — Z87891 Personal history of nicotine dependence: Secondary | ICD-10-CM | POA: Diagnosis not present

## 2021-03-09 DIAGNOSIS — Z91030 Bee allergy status: Secondary | ICD-10-CM | POA: Diagnosis not present

## 2021-03-09 DIAGNOSIS — R4189 Other symptoms and signs involving cognitive functions and awareness: Secondary | ICD-10-CM | POA: Diagnosis present

## 2021-03-09 DIAGNOSIS — I160 Hypertensive urgency: Secondary | ICD-10-CM

## 2021-03-09 DIAGNOSIS — I130 Hypertensive heart and chronic kidney disease with heart failure and stage 1 through stage 4 chronic kidney disease, or unspecified chronic kidney disease: Secondary | ICD-10-CM | POA: Diagnosis present

## 2021-03-09 DIAGNOSIS — I5043 Acute on chronic combined systolic (congestive) and diastolic (congestive) heart failure: Secondary | ICD-10-CM | POA: Diagnosis present

## 2021-03-09 DIAGNOSIS — R2689 Other abnormalities of gait and mobility: Secondary | ICD-10-CM | POA: Diagnosis present

## 2021-03-09 DIAGNOSIS — I509 Heart failure, unspecified: Secondary | ICD-10-CM | POA: Diagnosis not present

## 2021-03-09 DIAGNOSIS — N1831 Chronic kidney disease, stage 3a: Secondary | ICD-10-CM | POA: Diagnosis present

## 2021-03-09 DIAGNOSIS — Z91199 Patient's noncompliance with other medical treatment and regimen due to unspecified reason: Secondary | ICD-10-CM | POA: Diagnosis not present

## 2021-03-09 DIAGNOSIS — Z9114 Patient's other noncompliance with medication regimen: Secondary | ICD-10-CM | POA: Diagnosis not present

## 2021-03-09 DIAGNOSIS — N182 Chronic kidney disease, stage 2 (mild): Secondary | ICD-10-CM | POA: Diagnosis not present

## 2021-03-09 DIAGNOSIS — I493 Ventricular premature depolarization: Secondary | ICD-10-CM | POA: Diagnosis present

## 2021-03-09 DIAGNOSIS — E1140 Type 2 diabetes mellitus with diabetic neuropathy, unspecified: Secondary | ICD-10-CM | POA: Diagnosis present

## 2021-03-09 DIAGNOSIS — I5023 Acute on chronic systolic (congestive) heart failure: Secondary | ICD-10-CM

## 2021-03-09 DIAGNOSIS — R7989 Other specified abnormal findings of blood chemistry: Secondary | ICD-10-CM | POA: Diagnosis present

## 2021-03-09 DIAGNOSIS — N179 Acute kidney failure, unspecified: Secondary | ICD-10-CM | POA: Diagnosis present

## 2021-03-09 DIAGNOSIS — Z955 Presence of coronary angioplasty implant and graft: Secondary | ICD-10-CM | POA: Diagnosis not present

## 2021-03-09 DIAGNOSIS — E1122 Type 2 diabetes mellitus with diabetic chronic kidney disease: Secondary | ICD-10-CM | POA: Diagnosis present

## 2021-03-09 DIAGNOSIS — I2510 Atherosclerotic heart disease of native coronary artery without angina pectoris: Secondary | ICD-10-CM | POA: Diagnosis present

## 2021-03-09 DIAGNOSIS — Z8673 Personal history of transient ischemic attack (TIA), and cerebral infarction without residual deficits: Secondary | ICD-10-CM | POA: Diagnosis not present

## 2021-03-09 LAB — GLUCOSE, CAPILLARY
Glucose-Capillary: 100 mg/dL — ABNORMAL HIGH (ref 70–99)
Glucose-Capillary: 145 mg/dL — ABNORMAL HIGH (ref 70–99)

## 2021-03-09 LAB — RENAL FUNCTION PANEL
Albumin: 2.1 g/dL — ABNORMAL LOW (ref 3.5–5.0)
Anion gap: 7 (ref 5–15)
BUN: 19 mg/dL (ref 6–20)
CO2: 31 mmol/L (ref 22–32)
Calcium: 8.1 mg/dL — ABNORMAL LOW (ref 8.9–10.3)
Chloride: 101 mmol/L (ref 98–111)
Creatinine, Ser: 1.48 mg/dL — ABNORMAL HIGH (ref 0.61–1.24)
GFR, Estimated: 55 mL/min — ABNORMAL LOW (ref >60)
Glucose, Bld: 347 mg/dL — ABNORMAL HIGH (ref 70–99)
Phosphorus: 4.4 mg/dL (ref 2.5–4.6)
Potassium: 4.4 mmol/L (ref 3.5–5.1)
Sodium: 139 mmol/L (ref 135–145)

## 2021-03-09 LAB — ECHOCARDIOGRAM COMPLETE
AR max vel: 1.97 cm2
AV Area VTI: 1.58 cm2
AV Area mean vel: 1.83 cm2
AV Mean grad: 2.5 mmHg
AV Peak grad: 4.8 mmHg
Ao pk vel: 1.09 m/s
Area-P 1/2: 4.71 cm2
Calc EF: 22.9 %
Height: 69 in
S' Lateral: 4.5 cm
Single Plane A2C EF: 25.2 %
Single Plane A4C EF: 20 %

## 2021-03-09 LAB — CBC
HCT: 39.1 % (ref 39.0–52.0)
Hemoglobin: 12.7 g/dL — ABNORMAL LOW (ref 13.0–17.0)
MCH: 28.2 pg (ref 26.0–34.0)
MCHC: 32.5 g/dL (ref 30.0–36.0)
MCV: 86.7 fL (ref 80.0–100.0)
Platelets: 239 10*3/uL (ref 150–400)
RBC: 4.51 MIL/uL (ref 4.22–5.81)
RDW: 13.8 % (ref 11.5–15.5)
WBC: 5.8 10*3/uL (ref 4.0–10.5)
nRBC: 0 % (ref 0.0–0.2)

## 2021-03-09 LAB — BRAIN NATRIURETIC PEPTIDE: B Natriuretic Peptide: 2895.4 pg/mL — ABNORMAL HIGH (ref 0.0–100.0)

## 2021-03-09 LAB — CBG MONITORING, ED
Glucose-Capillary: 303 mg/dL — ABNORMAL HIGH (ref 70–99)
Glucose-Capillary: 170 mg/dL — ABNORMAL HIGH (ref 70–99)

## 2021-03-09 LAB — MAGNESIUM: Magnesium: 1.7 mg/dL (ref 1.7–2.4)

## 2021-03-09 MED ORDER — MAGNESIUM SULFATE 2 GM/50ML IV SOLN
2.0000 g | Freq: Once | INTRAVENOUS | Status: AC
  Administered 2021-03-09: 2 g via INTRAVENOUS
  Filled 2021-03-09: qty 50

## 2021-03-09 NOTE — Progress Notes (Signed)
Inpatient Diabetes Program Recommendations  AACE/ADA: New Consensus Statement on Inpatient Glycemic Control (2015)  Target Ranges:  Prepandial:   less than 140 mg/dL      Peak postprandial:   less than 180 mg/dL (1-2 hours)      Critically ill patients:  140 - 180 mg/dL   Lab Results  Component Value Date   GLUCAP 170 (H) 03/09/2021   HGBA1C >14.0 (H) 02/03/2021    Review of Glycemic Control Results for JERIC, SLAGEL (MRN [id number]) as of 03/09/2021 15:54  Ref. Range 03/08/2021 08:01 03/08/2021 16:50 03/09/2021 07:47 03/09/2021 12:17  Glucose-Capillary Latest Ref Range: 70 - 99 mg/dL 366 (H) 440 (H) 347 (H) 170 (H)   Diabetes history: DM 2 Outpatient Diabetes medications:  Jardiance 10 mg daily, Glucotrol 10 mg daily, Metformin 500 mg bid Basaglar 15 units- Not taking per medication reconciliation Current orders for Inpatient glycemic control:  Novolog moderate tid with meals Jardiance 10 mg daily Semglee 15 units daily  Inpatient Diabetes Program Recommendations:    Agree with current orders.  Will follow and discuss A1C patient with patient.    Thanks,  Beryl Meager, RN, BC-ADM Inpatient Diabetes Coordinator Pager [phone]  (8a-5p)

## 2021-03-09 NOTE — Progress Notes (Signed)
HF Navigation Team   Meets criteria for HF TOC . HFrEF  He had been off HF meds ~ 1year. Will need all medications prior to discharge.   Refer to HF [facility] SW/CM.   I will set up f/u in HF [facility] 03/19/21 at 3:00 .  Refer to [facility].    Avin Gibbons NP-C  4:15 PM

## 2021-03-09 NOTE — ED Notes (Signed)
Admit MD at bedside

## 2021-03-09 NOTE — Plan of Care (Signed)

## 2021-03-09 NOTE — Progress Notes (Signed)
2D echocardiogram completed.  03/09/2021 11:17 AM Eula Fried., MHA, RVT, RDCS, RDMS

## 2021-03-09 NOTE — ED Notes (Signed)
Tray served

## 2021-03-09 NOTE — Progress Notes (Signed)
PROGRESS NOTE  Thomas West HCW:[id number] DOB: 16-Jan-1961   PCP: [name], [name], MD  Patient is from: Home  DOA: 03/08/2021 LOS: 0  Chief complaints:  Chief Complaint  Patient presents with   Shortness of Breath     Brief Narrative / Interim history: 60 year old male with history of systolic CHF, CAD, DM-2, HTN, CVA and CKD-3A presenting with progressive edema, orthopnea and dyspnea on exertion for about 2 weeks.  Patient moved from [place] to [place] about a year ago.  He has not established care with PCP, and has not been able to fill his medications.  He is not sure about taking diuretics.  He also admits to taking ibuprofen twice daily.  Hypertensive to 180/120s.  BNP elevated to 3600.  Admitted for acute on chronic systolic CHF with marked anasarca, and started on IV Lasix.   Remains on IV Lasix.  Symptoms and blood pressure improved.  TTE pending.  Cardiology consulted.  Subjective: Seen and examined earlier this morning.  No major events overnight of this morning.  Patient was refusing CBG monitoring, insulin and subcu Lovenox due to fear for needles.  He is also questioning why he needs to be on insulin now instead of oral medications.  After extensive discussion about his uncontrolled diabetes and risk of DKA, HHS and long-term complications from untreated diabetes, he is agreeable with CBG monitoring and subcu insulin.  He continues to refuse subcu Lovenox but willing to take oral anticoagulation.  He reports improvement in his breathing but still with DOE.  Edema has improved.  He denies chest pain, GI or UTI symptoms.  Objective: Vitals:   03/09/21 0730 03/09/21 0745 03/09/21 0800 03/09/21 0804  BP: 118/80 122/75 (!) 115/98 (!) 115/98  Pulse: 69 66 71   Resp: (!) 23 16 17    Temp:      SpO2: 100% 100% 100%   Height:       No intake or output data in the 24 hours ending 03/09/21 1051  There were no vitals filed for this visit.  Examination:  GENERAL: No  apparent distress.  Nontoxic. HEENT: MMM.  Vision and hearing grossly intact.  NECK: Supple.  Notable prominent JVD RESP: 100% on RA.  No IWOB.  Bibasilar crackles. CVS:  RRR. Heart sounds normal.  ABD/GI/GU: BS+. Abd soft, NTND.  MSK/EXT:  Moves extremities. No apparent deformity.  1-2 BLE edema.  Cold to touch. SKIN: no apparent skin lesion or wound NEURO: Awake and alert. Oriented appropriately.  No apparent focal neuro deficit. PSYCH: Calm. Normal affect.   Procedures:  None  Microbiology summarized: COVID-19 and influenza PCR nonreactive.  Assessment & Plan: Acute on chronic systolic CHF: Unknown EF.  Not able to access his echocardiogram from 03/11/21.  Exacerbation likely due to noncompliance with medication NSAID use.  Elevated BNP to 3600.  Still with prominent JVD, significant edema and bibasilar crackles.  He might have right-sided heart failure.  About 1.7 L UOP but none charted from last night.  Net -2.1 L but he definitely had way more than that.  I still do not have weight on him.  Renal function stable.  BP improved. -Continue IV Lasix 60 mg twice daily -GDMT-Coreg, BiDil, Entresto and Jardiance -Monitor fluid status, renal functions and electrolytes -Follow echocardiogram -Sodium and fluid restrictions -Advised to avoid NSAIDs. -Cardiology consulted -Requested staff for strict I&O and daily weight  Hypertensive urgency: DBP as high as 140s in ED. improved. -Cardiac meds as above  History  of CAD: No chest pain. -Cardiac meds as above -Continue home Plavix and aspirin  Uncontrolled NIDDM-2 with hyperglycemia CKD-3: A1c > 14%.  Patient was refusing CBG monitoring and insulin.  He is now willing to have CBG monitoring and subcu insulin. Recent Labs  Lab 03/08/21 0801 03/08/21 1650 03/09/21 0747  GLUCAP 248* 340* 303*  -Continue SSI-moderate -Continue basal at 15 units daily -Continue mealtime coverage at 6 units 3 times daily -Continue statin -Diabetic  coordinator consulted.  CKD-3A: Stable Recent Labs    01/02/21 1308 02/03/21 0911 03/08/21 0258 03/09/21 0303  BUN 18 15 20 19   CREATININE 1.38* 1.46 1.43* 1.48*  -Continue monitoring  History of CVA without residual deficit -Continue Plavix, aspirin and statin for now  Body mass index is 23.78 kg/m.         DVT prophylaxis:  Patient refuses Lovenox.  SCD for VTE prophylaxis pending echocardiogram.  If no further cardiac evaluation, we can start low-dose NOAC.  Code Status: Full code Family Communication: Patient and/or RN. Available if any question.  Level of care: Telemetry Cardiac Status is: Observation  The patient will require care spanning > 2 midnights and should be moved to inpatient because: Due to significant fluid overload with prominent JVD and bilateral lower extremity edema.  Patient needs aggressive IV diuretics      Consultants:  Cardiology   Sch Meds:  Scheduled Meds:  aspirin EC  81 mg Oral Daily   atorvastatin  80 mg Oral Daily   carvedilol  25 mg Oral BID   clopidogrel  75 mg Oral Daily   docusate sodium  100 mg Oral Q12H   empagliflozin  10 mg Oral QAC breakfast   enoxaparin (LOVENOX) injection  40 mg Subcutaneous Q24H   furosemide  60 mg Intravenous BID   gabapentin  100 mg Oral TID   hydrALAZINE  50 mg Oral TID   insulin aspart  0-15 Units Subcutaneous TID WC   insulin glargine-yfgn  15 Units Subcutaneous Daily   isosorbide mononitrate  60 mg Oral Daily   polyethylene glycol  17 g Oral Daily   sacubitril-valsartan  1 tablet Oral BID   sodium chloride flush  3 mL Intravenous Q12H   Continuous Infusions:  sodium chloride     PRN Meds:.sodium chloride, acetaminophen, ondansetron (ZOFRAN) IV, sodium chloride flush  Antimicrobials: Anti-infectives (From admission, onward)    None        I have personally reviewed the following labs and images: CBC: Recent Labs  Lab 03/08/21 0258 03/08/21 0316 03/09/21 0303  WBC 7.1  --   5.8  NEUTROABS 4.1  --   --   HGB 15.2 17.0 12.7*  HCT 48.4 50.0 39.1  MCV 88.2  --  86.7  PLT 310  --  239   BMP &GFR Recent Labs  Lab 03/08/21 0258 03/08/21 0316 03/08/21 0527 03/09/21 0303  NA 137 140  --  139  K 4.5 4.4  --  4.4  CL 100  --   --  101  CO2 26  --   --  31  GLUCOSE 298*  --   --  347*  BUN 20  --   --  19  CREATININE 1.43*  --   --  1.48*  CALCIUM 8.6*  --   --  8.1*  MG  --   --  2.0 1.7  PHOS  --   --   --  4.4   CrCl cannot be calculated (Unknown ideal  weight.). Liver & Pancreas: Recent Labs  Lab 03/09/21 0303  ALBUMIN 2.1*   No results for input(s): LIPASE, AMYLASE in the last 168 hours. No results for input(s): AMMONIA in the last 168 hours. Diabetic: No results for input(s): HGBA1C in the last 72 hours. Recent Labs  Lab 03/08/21 0801 03/08/21 1650 03/09/21 0747  GLUCAP 248* 340* 303*   Cardiac Enzymes: No results for input(s): CKTOTAL, CKMB, CKMBINDEX, TROPONINI in the last 168 hours. Recent Labs    02/03/21 0911  PROBNP 3,012.0*   Coagulation Profile: No results for input(s): INR, PROTIME in the last 168 hours. Thyroid Function Tests: No results for input(s): TSH, T4TOTAL, FREET4, T3FREE, THYROIDAB in the last 72 hours. Lipid Profile: No results for input(s): CHOL, HDL, LDLCALC, TRIG, CHOLHDL, LDLDIRECT in the last 72 hours. Anemia Panel: No results for input(s): VITAMINB12, FOLATE, FERRITIN, TIBC, IRON, RETICCTPCT in the last 72 hours. Urine analysis:    Component Value Date/Time   COLORURINE YELLOW 03/08/2021 0246   APPEARANCEUR HAZY (A) 03/08/2021 0246   LABSPEC 1.019 03/08/2021 0246   PHURINE 5.0 03/08/2021 0246   GLUCOSEU 150 (A) 03/08/2021 0246   HGBUR NEGATIVE 03/08/2021 0246   BILIRUBINUR NEGATIVE 03/08/2021 0246   BILIRUBINUR negative 01/02/2021 1312   KETONESUR NEGATIVE 03/08/2021 0246   PROTEINUR >=300 (A) 03/08/2021 0246   UROBILINOGEN 0.2 01/02/2021 1312   NITRITE NEGATIVE 03/08/2021 0246   LEUKOCYTESUR  NEGATIVE 03/08/2021 0246   Sepsis Labs: Invalid input(s): PROCALCITONIN, LACTICIDVEN  Microbiology: Recent Results (from the past 240 hour(s))  Resp Panel by RT-PCR (Flu A&B, Covid) Nasopharyngeal Swab     Status: None   Collection Time: 03/08/21  6:48 AM   Specimen: Nasopharyngeal Swab; Nasopharyngeal(NP) swabs in vial transport medium  Result Value Ref Range Status   SARS Coronavirus 2 by RT PCR NEGATIVE NEGATIVE Final    Comment: (NOTE) SARS-CoV-2 target nucleic acids are NOT DETECTED.  The SARS-CoV-2 RNA is generally detectable in upper respiratory specimens during the acute phase of infection. The lowest concentration of SARS-CoV-2 viral copies this assay can detect is 138 copies/mL. A negative result does not preclude SARS-Cov-2 infection and should not be used as the sole basis for treatment or other patient management decisions. A negative result may occur with  improper specimen collection/handling, submission of specimen other than nasopharyngeal swab, presence of viral mutation(s) within the areas targeted by this assay, and inadequate number of viral copies(<138 copies/mL). A negative result must be combined with clinical observations, patient history, and epidemiological information. The expected result is Negative.  Fact Sheet for Patients:  BloggerCourse.com  Fact Sheet for Healthcare Providers:  SeriousBroker.it  This test is no t yet approved or cleared by the [place] FDA and  has been authorized for detection and/or diagnosis of SARS-CoV-2 by FDA under an Emergency Use Authorization (EUA). This EUA will remain  in effect (meaning this test can be used) for the duration of the COVID-19 declaration under Section 564(b)(1) of the Act, 21 U.S.C.section 360bbb-3(b)(1), unless the authorization is terminated  or revoked sooner.       Influenza A by PCR NEGATIVE NEGATIVE Final   Influenza B by PCR NEGATIVE  NEGATIVE Final    Comment: (NOTE) The Xpert Xpress SARS-CoV-2/FLU/RSV plus assay is intended as an aid in the diagnosis of influenza from Nasopharyngeal swab specimens and should not be used as a sole basis for treatment. Nasal washings and aspirates are unacceptable for Xpert Xpress SARS-CoV-2/FLU/RSV testing.  Fact Sheet for Patients: BloggerCourse.com  Fact  Sheet for Healthcare Providers: SeriousBroker.it  This test is not yet approved or cleared by the [place] and has been authorized for detection and/or diagnosis of SARS-CoV-2 by FDA under an Emergency Use Authorization (EUA). This EUA will remain in effect (meaning this test can be used) for the duration of the COVID-19 declaration under Section 564(b)(1) of the Act, 21 U.S.C. section 360bbb-3(b)(1), unless the authorization is terminated or revoked.  Performed at [facility], [address] [place], [place] [address]     Radiology Studies: No results found.     Bradly Sangiovanni T. Rehana Uncapher [facility]  If 7PM-7AM, please contact night-coverage [email or web address] 03/09/2021, 10:51 AM

## 2021-03-09 NOTE — Consult Note (Signed)
CARDIOLOGY CONSULT NOTE       Patient ID: Thomas West MRN: [id number] DOB/AGE: Jan 20, 1961 60 y.o.  Admit date: 03/08/2021 Referring Physician: Alanda Slim Primary Physician: [name], [name], MD Primary Cardiologist: New Reason for Consultation: CHF  Principal Problem:   Acute on chronic HFrEF (heart failure with reduced ejection fraction) (HCC) Active Problems:   History of MI (myocardial infarction)   Type 2 diabetes mellitus with diabetic neuropathy, unspecified (HCC)   CKD (chronic kidney disease), stage II   HLD (hyperlipidemia)   Hypertension, essential, benign   Acute on chronic systolic CHF (congestive heart failure) (HCC)   HPI:  60 y.o. with no medical f/u since moving to [place] from [place] over a year ago. Ran out of cardiac meds. Admitted with volume overload and CHF. He indicates being Rx with 2 stents 2 years ago at [facility] in [place]. "My hearts always been weak" but when I asked him if he has had CHF in past he said no. Denies ETOH/smoking or drugs. 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 Noted 2-3 weeks of LE edema and dyspnea No chest pain palpitations or syncope Has been Rx in ER with 2 doses of iv lasix with good diuresis . Currently in no distress with good sats and able to lay flat I reviewed his preliminary echo done here and EF is 25-30% with global hypokinesis  BP was elevated in ER BNP 3620 , K 4.4 , Cr 1.48 BUN 14 A1c 14 troponin negative and ECG with no acute changes   ROS All other systems reviewed and negative except as noted above  Past Medical History:  Diagnosis Date   CHF (congestive heart failure) (HCC)    Coronary artery disease    Diabetes mellitus without complication (HCC)    History of MI (myocardial infarction) 03/05/2019   Hypertension    Stroke (HCC)     No family history on file.  Social History   Socioeconomic History   Marital status: Single    Spouse name: Not on file   Number  of children: Not on file   Years of education: Not on file   Highest education level: Not on file  Occupational History   Not on file  Tobacco Use   Smoking status: Former    Types: Cigarettes    Quit date: 10/06/2017    Years since quitting: 3.4   Smokeless tobacco: Never  Substance and Sexual Activity   Alcohol use: Not Currently   Drug use: Yes    Types: Marijuana   Sexual activity: Not on file  Other Topics Concern   Not on file  Social History Narrative   Not on file   Social Determinants of Health   Financial Resource Strain: Not on file  Food Insecurity: Not on file  Transportation Needs: Not on file  Physical Activity: Not on file  Stress: Not on file  Social Connections: Not on file  Intimate Partner Violence: Not on file    Past Surgical History:  Procedure Laterality Date   leg surgery     "pins in left shin"      Current Facility-Administered Medications:    0.9 %  sodium chloride infusion, 250 mL, Intravenous, PRN, Julian Reil, Jared M, DO   acetaminophen (TYLENOL) tablet 650 mg, 650 mg, Oral, Q4H PRN, Julian Reil, Jared M, DO   aspirin EC tablet 81 mg, 81 mg, Oral, Daily, Gardner, Jared M, DO, 81 mg at  03/09/21 0802   atorvastatin (LIPITOR) tablet 80 mg, 80 mg, Oral, Daily, Lyda Perone M, DO, 80 mg at 03/09/21 0802   carvedilol (COREG) tablet 25 mg, 25 mg, Oral, BID, Julian Reil, Jared M, DO, 25 mg at 03/09/21 2025   clopidogrel (PLAVIX) tablet 75 mg, 75 mg, Oral, Daily, Julian Reil, Jared M, DO, 75 mg at 03/09/21 0803   docusate sodium (COLACE) capsule 100 mg, 100 mg, Oral, Q12H, Julian Reil, Jared M, DO, 100 mg at 03/09/21 4270   empagliflozin (JARDIANCE) tablet 10 mg, 10 mg, Oral, QAC breakfast, Julian Reil, Jared M, DO, 10 mg at 03/09/21 0759   enoxaparin (LOVENOX) injection 40 mg, 40 mg, Subcutaneous, Q24H, Julian Reil, Jared M, DO   furosemide (LASIX) injection 60 mg, 60 mg, Intravenous, BID, Alanda Slim, Taye T, MD, 60 mg at 03/09/21 0759   gabapentin (NEURONTIN) capsule 100 mg,  100 mg, Oral, TID, Julian Reil, Jared M, DO, 100 mg at 03/09/21 6237   hydrALAZINE (APRESOLINE) tablet 50 mg, 50 mg, Oral, TID, Julian Reil, Jared M, DO, 50 mg at 03/09/21 0804   insulin aspart (novoLOG) injection 0-15 Units, 0-15 Units, Subcutaneous, TID WC, Julian Reil, Jared M, DO, 11 Units at 03/09/21 0800   insulin glargine-yfgn (SEMGLEE) injection 15 Units, 15 Units, Subcutaneous, Daily, Hillary Bow, DO, 15 Units at 03/09/21 0805   isosorbide mononitrate (IMDUR) 24 hr tablet 60 mg, 60 mg, Oral, Daily, Julian Reil, Jared M, DO, 60 mg at 03/09/21 0938   ondansetron (ZOFRAN) injection 4 mg, 4 mg, Intravenous, Q6H PRN, Julian Reil, Jared M, DO   polyethylene glycol (MIRALAX / GLYCOLAX) packet 17 g, 17 g, Oral, Daily, Julian Reil, Jared M, DO, 17 g at 03/09/21 6283   sacubitril-valsartan (ENTRESTO) 24-26 mg per tablet, 1 tablet, Oral, BID, Hillary Bow, DO, 1 tablet at 03/09/21 0805   sodium chloride flush (NS) 0.9 % injection 3 mL, 3 mL, Intravenous, Q12H, Julian Reil, Jared M, DO, 3 mL at 03/09/21 0806   sodium chloride flush (NS) 0.9 % injection 3 mL, 3 mL, Intravenous, PRN, Hillary Bow, DO  Current Outpatient Medications:    aspirin EC 81 MG tablet, Take 1 tablet (81 mg total) by mouth daily., Disp: 30 tablet, Rfl: 0   ibuprofen (ADVIL) 200 MG tablet, Take 400 mg by mouth every 6 (six) hours as needed for headache or moderate pain., Disp: , Rfl:    metFORMIN (GLUCOPHAGE) 500 MG tablet, Take 1 tablet (500 mg total) by mouth 2 (two) times daily with a meal., Disp: 180 tablet, Rfl: 1   sacubitril-valsartan (ENTRESTO) 24-26 MG, Take 1 tablet by mouth 2 (two) times daily., Disp: 60 tablet, Rfl: 2   atorvastatin (LIPITOR) 80 MG tablet, Take 1 tablet (80 mg total) by mouth daily. (Patient not taking: Reported on 03/08/2021), Disp: 90 tablet, Rfl: 3   carvedilol (COREG) 25 MG tablet, TAKE ONE TABLET BY MOUTH EVERY 12 HOURS (10AM &10PM) (Patient not taking: No sig reported), Disp: 180 tablet, Rfl: 0   clopidogrel  (PLAVIX) 75 MG tablet, Take 1 tablet (75 mg total) by mouth daily. (Patient not taking: Reported on 03/08/2021), Disp: 90 tablet, Rfl: 0   docusate sodium (COLACE) 100 MG capsule, Take 1 capsule (100 mg total) by mouth every 12 (twelve) hours. (Patient not taking: Reported on 03/08/2021), Disp: 60 capsule, Rfl: 0   empagliflozin (JARDIANCE) 10 MG TABS tablet, Take 1 tablet (10 mg total) by mouth daily before breakfast. (Patient not taking: Reported on 03/08/2021), Disp: 30 tablet, Rfl: 3   furosemide (LASIX) 20 MG tablet,  Take 1 tablet (20 mg total) by mouth daily. (Patient not taking: Reported on 03/08/2021), Disp: 30 tablet, Rfl: 3   gabapentin (NEURONTIN) 100 MG capsule, Take 1 capsule (100 mg total) by mouth 3 (three) times daily. (Patient not taking: Reported on 03/08/2021), Disp: 90 capsule, Rfl: 0   glipiZIDE (GLUCOTROL XL) 10 MG 24 hr tablet, Take 1 tablet (10 mg total) by mouth daily with breakfast. (Patient not taking: Reported on 03/08/2021), Disp: 30 tablet, Rfl: 1   hydrALAZINE (APRESOLINE) 50 MG tablet, TAKE 1 TABLET(50 MG) BY MOUTH THREE TIMES DAILY (Patient not taking: Reported on 03/08/2021), Disp: 270 tablet, Rfl: 1   Insulin Glargine (BASAGLAR KWIKPEN) 100 UNIT/ML, Inject 15 Units into the skin daily. (Patient not taking: Reported on 03/08/2021), Disp: 15 mL, Rfl: 2   Insulin Pen Needle (PEN NEEDLES) 32G X 4 MM MISC, To use with insulin pen. (Patient not taking: Reported on 03/08/2021), Disp: 100 each, Rfl: 3   isosorbide mononitrate (IMDUR) 60 MG 24 hr tablet, Take 1 tablet (60 mg total) by mouth daily. (Patient not taking: Reported on 03/08/2021), Disp: 30 tablet, Rfl: 0   polyethylene glycol (MIRALAX / GLYCOLAX) 17 g packet, Take 17 g by mouth daily. (Patient not taking: Reported on 03/08/2021), Disp: 14 each, Rfl: 0  aspirin EC  81 mg Oral Daily   atorvastatin  80 mg Oral Daily   carvedilol  25 mg Oral BID   clopidogrel  75 mg Oral Daily   docusate sodium  100 mg Oral Q12H    empagliflozin  10 mg Oral QAC breakfast   enoxaparin (LOVENOX) injection  40 mg Subcutaneous Q24H   furosemide  60 mg Intravenous BID   gabapentin  100 mg Oral TID   hydrALAZINE  50 mg Oral TID   insulin aspart  0-15 Units Subcutaneous TID WC   insulin glargine-yfgn  15 Units Subcutaneous Daily   isosorbide mononitrate  60 mg Oral Daily   polyethylene glycol  17 g Oral Daily   sacubitril-valsartan  1 tablet Oral BID   sodium chloride flush  3 mL Intravenous Q12H    sodium chloride      Physical Exam: Blood pressure 121/77, pulse 66, temperature 97.8 F (36.6 C), resp. rate (!) 21, height 5\' 9"  (1.753 m), SpO2 100 %.   Affect appropriate Chronically ill black male  HEENT: normal Neck supple with no adenopathy JVP elevated  no bruits no thyromegaly Lungs clear with no wheezing and good diaphragmatic motion Heart:  S1/S2 no murmur, no rub, gallop or click PMI  enlarged  Abdomen: benighn, BS positve, no tenderness, no AAA no bruit.  No HSM or HJR Distal pulses intact with no bruits Plus 2 LE edema Neuro non-focal Skin warm and dry No muscular weakness   Labs:   Lab Results  Component Value Date   WBC 5.8 03/09/2021   HGB 12.7 (L) 03/09/2021   HCT 39.1 03/09/2021   MCV 86.7 03/09/2021   PLT 239 03/09/2021    Recent Labs  Lab 03/09/21 0303  NA 139  K 4.4  CL 101  CO2 31  BUN 19  CREATININE 1.48*  CALCIUM 8.1*  GLUCOSE 347*   No results found for: CKTOTAL, CKMB, CKMBINDEX, TROPONINI  Lab Results  Component Value Date   CHOL 189 02/03/2021   Lab Results  Component Value Date   HDL 44.60 02/03/2021   Lab Results  Component Value Date   LDLCALC 120 (H) 02/03/2021   Lab Results  Component Value  Date   TRIG 123.0 02/03/2021   Lab Results  Component Value Date   CHOLHDL 4 02/03/2021   No results found for: LDLDIRECT    Radiology: DG Chest 2 View  Result Date: 03/08/2021 CLINICAL DATA:  Shortness of breath EXAM: CHEST - 2 VIEW COMPARISON:   02/06/2018 FINDINGS: Cardiac shadow is enlarged but stable. Aortic calcifications are seen. Small pleural effusions are noted bilaterally. Focal airspace opacity is noted in the medial right lung base projecting in the right lower lobe. No bony abnormality is noted. IMPRESSION: Right lower lobe infiltrate with small pleural effusions bilaterally. Electronically Signed   By: Alcide Clever M.D.   On: 03/08/2021 03:31    EKG: SR LVH nonspecific ST changes    ASSESSMENT AND PLAN:   CHF:  not clear if acute on chronic with poor history and no records from [place]. Clearly lack of medical f/u and meds contributes Continue hydralazine / nitrates given renal failure, coreg , lasix and jardiance He should improve with medication but is high risk in regard to f/u Would benefit from seeing CHF clinic as outpatient  CAD:  History of stents 2 years ago per patient. No acute ECG changes troponin negative and no chest pain. Makes it hard to figure out if ischemic or non ischemic DCM but TTE shows global hypokinesis and given essentially unRx HTN/DM suspect mostly non ischemic DM:  no insight into diet / control A1c> 14 per primary service He indicates he has a primary care appointment with ? Dr Gayla Doss next month will need closer f/u for this to prevent DKA CRF: CR 1.43 may worsen with diuresis in setting of uncontrolled DM  CXR:  abnormal ? RLL infiltrate with small effusion would use IS and repeat 2 few CXR in am Does not appear to have infection   Signed: Charlton Haws 03/09/2021, 1:51 PM

## 2021-03-10 ENCOUNTER — Other Ambulatory Visit (HOSPITAL_COMMUNITY): Payer: Self-pay

## 2021-03-10 DIAGNOSIS — Z91199 Patient's noncompliance with other medical treatment and regimen due to unspecified reason: Secondary | ICD-10-CM

## 2021-03-10 DIAGNOSIS — I5043 Acute on chronic combined systolic (congestive) and diastolic (congestive) heart failure: Secondary | ICD-10-CM | POA: Diagnosis not present

## 2021-03-10 DIAGNOSIS — I5023 Acute on chronic systolic (congestive) heart failure: Secondary | ICD-10-CM | POA: Diagnosis not present

## 2021-03-10 DIAGNOSIS — I252 Old myocardial infarction: Secondary | ICD-10-CM | POA: Diagnosis not present

## 2021-03-10 DIAGNOSIS — N182 Chronic kidney disease, stage 2 (mild): Secondary | ICD-10-CM | POA: Diagnosis not present

## 2021-03-10 DIAGNOSIS — E1165 Type 2 diabetes mellitus with hyperglycemia: Secondary | ICD-10-CM

## 2021-03-10 LAB — RENAL FUNCTION PANEL
Albumin: 2.1 g/dL — ABNORMAL LOW (ref 3.5–5.0)
Anion gap: 8 (ref 5–15)
BUN: 21 mg/dL — ABNORMAL HIGH (ref 6–20)
CO2: 34 mmol/L — ABNORMAL HIGH (ref 22–32)
Calcium: 8.3 mg/dL — ABNORMAL LOW (ref 8.9–10.3)
Chloride: 102 mmol/L (ref 98–111)
Creatinine, Ser: 1.56 mg/dL — ABNORMAL HIGH (ref 0.61–1.24)
GFR, Estimated: 51 mL/min — ABNORMAL LOW (ref >60)
Glucose, Bld: 169 mg/dL — ABNORMAL HIGH (ref 70–99)
Phosphorus: 4.2 mg/dL (ref 2.5–4.6)
Potassium: 4 mmol/L (ref 3.5–5.1)
Sodium: 144 mmol/L (ref 135–145)

## 2021-03-10 LAB — CBC
HCT: 39.8 % (ref 39.0–52.0)
Hemoglobin: 13.2 g/dL (ref 13.0–17.0)
MCH: 27.8 pg (ref 26.0–34.0)
MCHC: 33.2 g/dL (ref 30.0–36.0)
MCV: 84 fL (ref 80.0–100.0)
Platelets: 267 10*3/uL (ref 150–400)
RBC: 4.74 MIL/uL (ref 4.22–5.81)
RDW: 13.8 % (ref 11.5–15.5)
WBC: 6.7 10*3/uL (ref 4.0–10.5)
nRBC: 0 % (ref 0.0–0.2)

## 2021-03-10 LAB — GLUCOSE, CAPILLARY
Glucose-Capillary: 138 mg/dL — ABNORMAL HIGH (ref 70–99)
Glucose-Capillary: 174 mg/dL — ABNORMAL HIGH (ref 70–99)
Glucose-Capillary: 254 mg/dL — ABNORMAL HIGH (ref 70–99)
Glucose-Capillary: 260 mg/dL — ABNORMAL HIGH (ref 70–99)
Glucose-Capillary: 182 mg/dL — ABNORMAL HIGH (ref 70–99)
Glucose-Capillary: 93 mg/dL (ref 70–99)

## 2021-03-10 LAB — MAGNESIUM: Magnesium: 1.9 mg/dL (ref 1.7–2.4)

## 2021-03-10 NOTE — Progress Notes (Addendum)
Heart Failure Navigation Team Progress Note  PCP: [name], [name], MD Primary Cardiologist: Wendall Stade, MD Admitted from: home  Past Medical History:  Diagnosis Date   CHF (congestive heart failure) (HCC)    Coronary artery disease    Diabetes mellitus without complication (HCC)    History of MI (myocardial infarction) 03/05/2019   Hypertension    Stroke [facility])     Social History   Socioeconomic History   Marital status: Single    Spouse name: Not on file   Number of children: Not on file   Years of education: Not on file   Highest education level: Not on file  Occupational History   Not on file  Tobacco Use   Smoking status: Former    Types: Cigarettes    Quit date: 10/06/2017    Years since quitting: 3.4   Smokeless tobacco: Never  Substance and Sexual Activity   Alcohol use: Not Currently   Drug use: Yes    Types: Marijuana   Sexual activity: Not on file  Other Topics Concern   Not on file  Social History Narrative   Not on file   Social Determinants of Health   Financial Resource Strain: Low Risk    Difficulty of Paying Living Expenses: Not very hard  Food Insecurity: No Food Insecurity   Worried About Running Out of Food in the Last Year: Never true   Ran Out of Food in the Last Year: Never true  Transportation Needs: No Transportation Needs   Lack of Transportation (Medical): No   Lack of Transportation (Non-Medical): No  Physical Activity: Not on file  Stress: Not on file  Social Connections: Not on file     Heart & Vascular Transition of Care Clinic follow-up: Scheduled for 03/19/21 at 3pm.  Confirmed transportation.  Immediate social needs: Enrolled patient in [employer].  Amaris Garrette, MSW, LCSWA [phone] Heart Failure Social Worker

## 2021-03-10 NOTE — H&P (View-Only) (Signed)
 Progress Note  Patient Name: Thomas West Date of Encounter: 03/10/2021  [facility] Cardiologist: Maysun Meditz, MD new  Subjective   Breathing much better than on admission.  Says that he has good family support down here and will be able to get in take his medications at discharge.  He does not have scales.  Inpatient Medications    Scheduled Meds:  aspirin EC  81 mg Oral Daily   atorvastatin  80 mg Oral Daily   carvedilol  25 mg Oral BID   clopidogrel  75 mg Oral Daily   docusate sodium  100 mg Oral Q12H   empagliflozin  10 mg Oral QAC breakfast   enoxaparin (LOVENOX) injection  40 mg Subcutaneous Q24H   furosemide  60 mg Intravenous BID   gabapentin  100 mg Oral TID   hydrALAZINE  50 mg Oral TID   insulin aspart  0-15 Units Subcutaneous TID WC   insulin glargine-yfgn  15 Units Subcutaneous Daily   isosorbide mononitrate  60 mg Oral Daily   polyethylene glycol  17 g Oral Daily   sacubitril-valsartan  1 tablet Oral BID   sodium chloride flush  3 mL Intravenous Q12H   Continuous Infusions:  sodium chloride     PRN Meds: sodium chloride, acetaminophen, ondansetron (ZOFRAN) IV, sodium chloride flush   Vital Signs    Vitals:   03/09/21 1606 03/09/21 1608 03/10/21 0048 03/10/21 0501  BP: 103/75 103/75 98/62 92/74  Pulse: 70 68 77 72  Resp: 18 18 20 18  Temp: (!) 97.5 F (36.4 C) (!) 97.5 F (36.4 C) 98.4 F (36.9 C) 97.9 F (36.6 C)  TempSrc: Oral Oral Oral Oral  SpO2: 98% 98% 90% 92%  Weight: 78.3 kg   77.5 kg  Height: 5' 9" (1.753 m)       Intake/Output Summary (Last 24 hours) at 03/10/2021 0748 Last data filed at 03/10/2021 0100 Gross per 24 hour  Intake 240 ml  Output 3700 ml  Net -3460 ml   Last 3 Weights 03/10/2021 03/09/2021 02/03/2021  Weight (lbs) 170 lb 12.8 oz 172 lb 9.9 oz 161 lb  Weight (kg) 77.474 kg 78.3 kg 73.029 kg      Telemetry    Sinus rhythm- Personally Reviewed  ECG    None today- Personally Reviewed  Physical Exam    GEN: No acute distress at rest, but is short of breath with minimal exertion on O2 Neck: JVD 10 cm Cardiac: RRR, soft murmurs, rubs, or gallops.  Respiratory: Rales bases bilaterally. GI: Soft, nontender, non-distended  MS: 1+ edema; No deformity. Neuro:  Nonfocal  Psych: Normal affect   Labs    High Sensitivity Troponin:   Recent Labs  Lab 03/08/21 0258 03/08/21 0527  TROPONINIHS 20* 22*     Chemistry Recent Labs  Lab 03/08/21 0258 03/08/21 0316 03/08/21 0527 03/09/21 0303 03/10/21 0351  NA 137 140  --  139 144  K 4.5 4.4  --  4.4 4.0  CL 100  --   --  101 102  CO2 26  --   --  31 34*  GLUCOSE 298*  --   --  347* 169*  BUN 20  --   --  19 21*  CREATININE 1.43*  --   --  1.48* 1.56*  CALCIUM 8.6*  --   --  8.1* 8.3*  MG  --   --  2.0 1.7 1.9  ALBUMIN  --   --   --    2.1* 2.1*  GFRNONAA 57*  --   --  55* 51*  ANIONGAP 11  --   --  7 8    Lipids No results for input(s): CHOL, TRIG, HDL, LABVLDL, LDLCALC, CHOLHDL in the last 168 hours.  Hematology Recent Labs  Lab 03/08/21 0258 03/08/21 0316 03/09/21 0303 03/10/21 0351  WBC 7.1  --  5.8 6.7  RBC 5.49  --  4.51 4.74  HGB 15.2 17.0 12.7* 13.2  HCT 48.4 50.0 39.1 39.8  MCV 88.2  --  86.7 84.0  MCH 27.7  --  28.2 27.8  MCHC 31.4  --  32.5 33.2  RDW 14.1  --  13.8 13.8  PLT 310  --  239 267   Thyroid No results for input(s): TSH, FREET4 in the last 168 hours.  BNP Recent Labs  Lab 03/08/21 0258 03/09/21 0304  BNP 3,620.7* 2,895.4*    DDimer No results for input(s): DDIMER in the last 168 hours.  Lab Results  Component Value Date   HGBA1C >14.0 (H) 02/03/2021    Radiology    ECHOCARDIOGRAM COMPLETE  Result Date: 03/09/2021    ECHOCARDIOGRAM REPORT   Patient Name:   Thomas West Date of Exam: 03/09/2021 Medical Rec #:  [id number]     Height:       69.0 in Accession #:    [id number]    Weight:       161.0 lb Date of Birth:  01-Aug-1960     BSA:          1.884 m Patient Age:    59 years      BP:            115/98 mmHg Patient Gender: M             HR:           64 bpm. Exam Location:  Inpatient Procedure: 2D Echo, Cardiac Doppler and Color Doppler Indications:    CHF- Acute systolic  History:        Patient has no prior history of Echocardiogram examinations.                 CHF, CAD and Acute MI, Stroke; Risk Factors:Diabetes and                 Hypertension.  Sonographer:    Gertie Fey MHA, RDMS, RVT, RDCS Referring Phys: 4 JARED M GARDNER IMPRESSIONS  1. Left ventricular ejection fraction, by estimation, is 20 to 25%. The left ventricle has severely decreased function. The left ventricle demonstrates global hypokinesis. Left ventricular diastolic parameters are consistent with Grade II diastolic dysfunction (pseudonormalization).  2. Right ventricular systolic function is normal. The right ventricular size is mildly enlarged. There is normal pulmonary artery systolic pressure. The estimated right ventricular systolic pressure is 24.0 mmHg.  3. Right atrial size was mildly dilated.  4. The mitral valve is normal in structure. Trivial mitral valve regurgitation. No evidence of mitral stenosis.  5. Tricuspid valve regurgitation is moderate.  6. The aortic valve is tricuspid. Aortic valve regurgitation is not visualized. Mild aortic valve sclerosis is present, with no evidence of aortic valve stenosis.  7. The inferior vena cava is normal in size with greater than 50% respiratory variability, suggesting right atrial pressure of 3 mmHg. FINDINGS  Left Ventricle: Left ventricular ejection fraction, by estimation, is 20 to 25%. The left ventricle has severely decreased function. The left ventricle demonstrates global hypokinesis. The left ventricular internal cavity size  was normal in size. There is no left ventricular hypertrophy. Left ventricular diastolic parameters are consistent with Grade II diastolic dysfunction (pseudonormalization). Right Ventricle: The right ventricular size is mildly enlarged. No  increase in right ventricular wall thickness. Right ventricular systolic function is normal. There is normal pulmonary artery systolic pressure. The tricuspid regurgitant velocity is 2.29  m/s, and with an assumed right atrial pressure of 3 mmHg, the estimated right ventricular systolic pressure is 24.0 mmHg. Left Atrium: Left atrial size was normal in size. Right Atrium: Right atrial size was mildly dilated. Pericardium: There is no evidence of pericardial effusion. Mitral Valve: The mitral valve is normal in structure. There is mild thickening of the mitral valve leaflet(s). There is mild calcification of the mitral valve leaflet(s). Trivial mitral valve regurgitation. No evidence of mitral valve stenosis. Tricuspid Valve: The tricuspid valve is normal in structure. Tricuspid valve regurgitation is moderate . No evidence of tricuspid stenosis. Aortic Valve: The aortic valve is tricuspid. Aortic valve regurgitation is not visualized. Mild aortic valve sclerosis is present, with no evidence of aortic valve stenosis. Aortic valve mean gradient measures 2.5 mmHg. Aortic valve peak gradient measures 4.8 mmHg. Aortic valve area, by VTI measures 1.58 cm. Pulmonic Valve: The pulmonic valve was normal in structure. Pulmonic valve regurgitation is not visualized. No evidence of pulmonic stenosis. Aorta: The aortic root is normal in size and structure. Venous: The inferior vena cava is normal in size with greater than 50% respiratory variability, suggesting right atrial pressure of 3 mmHg. IAS/Shunts: No atrial level shunt detected by color flow Doppler.  LEFT VENTRICLE PLAX 2D LVIDd:         4.90 cm      Diastology LVIDs:         4.50 cm      LV e' medial:    4.19 cm/s LV PW:         1.00 cm      LV E/e' medial:  11.3 LV IVS:        1.00 cm      LV e' lateral:   6.53 cm/s LVOT diam:     2.00 cm      LV E/e' lateral: 7.3 LV SV:         36 LV SV Index:   19 LVOT Area:     3.14 cm  LV Volumes (MOD) LV vol d, MOD A2C: 124.0  ml LV vol d, MOD A4C: 99.9 ml LV vol s, MOD A2C: 92.8 ml LV vol s, MOD A4C: 79.9 ml LV SV MOD A2C:     31.2 ml LV SV MOD A4C:     99.9 ml LV SV MOD BP:      25.6 ml RIGHT VENTRICLE RV S prime:     5.36 cm/s TAPSE (M-mode): 1.3 cm LEFT ATRIUM             Index        RIGHT ATRIUM           Index LA diam:        3.90 cm 2.07 cm/m   RA Area:     21.30 cm LA Vol (A2C):   51.5 ml 27.33 ml/m  RA Volume:   66.20 ml  35.14 ml/m LA Vol (A4C):   56.1 ml 29.78 ml/m LA Biplane Vol: 56.7 ml 30.09 ml/m  AORTIC VALVE AV Area (Vmax):    1.97 cm AV Area (Vmean):   1.83 cm AV Area (VTI):  1.58 cm AV Vmax:           109.00 cm/s AV Vmean:          72.500 cm/s AV VTI:            0.227 m AV Peak Grad:      4.8 mmHg AV Mean Grad:      2.5 mmHg LVOT Vmax:         68.40 cm/s LVOT Vmean:        42.200 cm/s LVOT VTI:          0.114 m LVOT/AV VTI ratio: 0.50  AORTA Ao Root diam: 3.00 cm MITRAL VALVE               TRICUSPID VALVE MV Area (PHT): 4.71 cm    TR Peak grad:   21.0 mmHg MV Decel Time: 161 msec    TR Vmax:        229.00 cm/s MV E velocity: 47.50 cm/s MV A velocity: 39.55 cm/s  SHUNTS MV E/A ratio:  1.20        Systemic VTI:  0.11 m                            Systemic Diam: 2.00 cm Donato Schultz MD Electronically signed by Donato Schultz MD Signature Date/Time: 03/09/2021/1:53:07 PM    Final     Cardiac Studies   ECHO: 03/09/2021 1. Left ventricular ejection fraction, by estimation, is 20 to 25%. The  left ventricle has severely decreased function. The left ventricle  demonstrates global hypokinesis. Left ventricular diastolic parameters are consistent with Grade II diastolic dysfunction (pseudonormalization).   2. Right ventricular systolic function is normal. The right ventricular  size is mildly enlarged. There is normal pulmonary artery systolic  pressure. The estimated right ventricular systolic pressure is 24.0 mmHg.   3. Right atrial size was mildly dilated.   4. The mitral valve is normal in structure.  Trivial mitral valve  regurgitation. No evidence of mitral stenosis.   5. Tricuspid valve regurgitation is moderate.   6. The aortic valve is tricuspid. Aortic valve regurgitation is not  visualized. Mild aortic valve sclerosis is present, with no evidence of aortic valve stenosis.   7. The inferior vena cava is normal in size with greater than 50%  respiratory variability, suggesting right atrial pressure of 3 mmHg.   Patient Profile     60 y.o. male with a history of HFrEF, CAD, DM2, HTN, HLD, CKD II-III, was admitted 10/17 with CHF.  Assessment & Plan    ACute on chronic combined systolic and diastolic CHF -Continue diuresis intake/output net -5.5 L so far - Weight down 2 pounds, currently 170.8 pounds, dry weight unclear - He is symptomatically improved, but still has a long way to go -His BUN and creatinine are trending up slightly, continue to follow -Continue diuresis - He is currently on Lasix 60 mg IV twice daily, carvedilol 25 mg twice daily, hydralazine 50 mg 3 times daily, Imdur 60 mg daily, Entresto 24-26 mg daily - At this time, SBP 90s-100s but he is tolerating it well.  Continue current therapy - Otherwise, per IM     For questions or updates, please contact [facility] Please consult [email or web address] for contact info under        Signed, Theodore Demark, PA-C  03/10/2021, 7:48 AM     Patient examined chart reviewed Still with LE edema and JVP but lungs  and clinical symptoms improved Not sure he will tolerate both hydralazine/nitrates and entresto follow Cr with diuresis I discussed right and left cath with him since he has had previous stents and EF severely reduced presenting with acute systolic CHF  He is willing to have it done Will tentatively scheudle for Thursday  Charlton Haws MD [facility]

## 2021-03-10 NOTE — TOC Benefit Eligibility Note (Signed)
Patient Product/process development scientist completed.    The patient is currently admitted and upon discharge could be taking Entresto 24-26 mg.  The current 30 day co-pay is, $47.00.   The patient is currently admitted and upon discharge could be taking Jardiance 10 mg.  The current 30 day co-pay is, $47.00.   The patient is currently admitted and upon discharge could be taking Basaglar Pens  The current 30 day co-pay is, $100.00.   The patient is currently admitted and upon discharge could be taking Lantus Pens.  The current 30 day co-pay is, $35.00.   The patient is insured through [employer]     Roland Earl, CPhT Pharmacy Patient Advocate Specialist [facility] Antimicrobial Stewardship Team Direct Number: [phone]  Fax: [phone]

## 2021-03-10 NOTE — Evaluation (Signed)
Physical Therapy Evaluation Patient Details Name: Thomas West MRN: [id number] DOB: 08-Aug-1960 Today's Date: 03/10/2021  History of Present Illness  Thomas West is a 60 y.o. male with medical history significant of HFrEF, prior MI, DM2, HTN. Pt presents to ED with 2+ weeks of worsening peripheral edema, anasarca. Now up to abdomen. Associated orthopnea, DOE, no CP. Patient planning for cardiac cath on Thursday 10/20.   Clinical Impression  Patient received in bed, agreeable to PT assessment. Patient lives with 44 year old son who is at school during day. States he has a lot of family nearby who can assist. He is independent with bed mobility, transfers with supervision. He was able to ambulate 150 feet without AD, weak and unsteady with ambulation in hall. Patient will benefit from continued skilled PT while here to improve strength and safety with mobility for return home.         Recommendations for follow up therapy are one component of a multi-disciplinary discharge planning process, led by the attending physician.  Recommendations may be updated based on patient status, additional functional criteria and insurance authorization.  Follow Up Recommendations Home health PT    Equipment Recommendations  3in1 (PT)    Recommendations for Other Services       Precautions / Restrictions Precautions Precautions: None Restrictions Weight Bearing Restrictions: No      Mobility  Bed Mobility Overal bed mobility: Modified Independent                  Transfers Overall transfer level: Needs assistance Equipment used: None Transfers: Sit to/from Stand Sit to Stand: Supervision            Ambulation/Gait Ambulation/Gait assistance: Min guard;Min assist Gait Distance (Feet): 150 Feet Assistive device: 1 person hand held assist Gait Pattern/deviations: Step-through pattern;Drifts right/left Gait velocity: decr   General Gait Details: generally unsteady. Drifts right  and left. Required one standing rest and reaches out for rails in hallway.  Stairs            Wheelchair Mobility    Modified Rankin (Stroke Patients Only)       Balance Overall balance assessment: Needs assistance Sitting-balance support: Feet supported Sitting balance-Leahy Scale: Good     Standing balance support: Single extremity supported;During functional activity Standing balance-Leahy Scale: Fair Standing balance comment: requires min guard to min assist at times ambulating.                             Pertinent Vitals/Pain Pain Assessment: No/denies pain    Home Living Family/patient expects to be discharged to:: Private residence Living Arrangements: Children Available Help at Discharge: Family;Available PRN/intermittently Type of Home: House Home Access: Stairs to enter   [employer] of Steps: 1 Home Layout: Two level;Bed/bath upstairs Home Equipment: Walker - 2 wheels      Prior Function Level of Independence: Independent               Hand Dominance        Extremity/Trunk Assessment   Upper Extremity Assessment Upper Extremity Assessment: Overall WFL for tasks assessed    Lower Extremity Assessment Lower Extremity Assessment: Generalized weakness    Cervical / Trunk Assessment Cervical / Trunk Assessment: Normal  Communication   Communication: No difficulties  Cognition Arousal/Alertness: Awake/alert Behavior During Therapy: WFL for tasks assessed/performed Overall Cognitive Status: Within Functional Limits for tasks assessed  General Comments      Exercises     Assessment/Plan    PT Assessment Patient needs continued PT services  PT Problem List Decreased strength;Decreased mobility;Decreased safety awareness;Decreased balance;Decreased knowledge of use of DME;Decreased activity tolerance       PT Treatment Interventions Therapeutic  activities;Gait training;Therapeutic exercise;DME instruction;Stair training;Functional mobility training;Balance training;Patient/family education    PT Goals (Current goals can be found in the Care Plan section)  Acute Rehab PT Goals Patient Stated Goal: return home PT Goal Formulation: With patient Time For Goal Achievement: 03/23/21 Potential to Achieve Goals: Good    Frequency Min 3X/week   Barriers to discharge Inaccessible home environment has to get up a flight of steps to get to bedroom/bathroom    Co-evaluation               AM-PAC PT "6 Clicks" Mobility  Outcome Measure Help needed turning from your back to your side while in a flat bed without using bedrails?: None Help needed moving from lying on your back to sitting on the side of a flat bed without using bedrails?: None Help needed moving to and from a bed to a chair (including a wheelchair)?: A Little Help needed standing up from a chair using your arms (e.g., wheelchair or bedside chair)?: A Little Help needed to walk in hospital room?: A Little Help needed climbing 3-5 steps with a railing? : A Lot 6 Click Score: 19    End of Session   Activity Tolerance: Patient limited by fatigue Patient left: in chair;with call bell/phone within reach Nurse Communication: Mobility status PT Visit Diagnosis: Unsteadiness on feet (R26.81);Muscle weakness (generalized) (M62.81)    Time: 5732-2025 PT Time Calculation (min) (ACUTE ONLY): 14 min   Charges:   PT Evaluation $PT Eval Moderate Complexity: 1 Mod          Hlee Fringer, PT, GCS 03/10/21,11:49 AM

## 2021-03-10 NOTE — Progress Notes (Addendum)
Progress Note  Patient Name: Thomas West Date of Encounter: 03/10/2021  [facility] Cardiologist: Charlton Haws, MD new  Subjective   Breathing much better than on admission.  Says that he has good family support down here and will be able to get in take his medications at discharge.  He does not have scales.  Inpatient Medications    Scheduled Meds:  aspirin EC  81 mg Oral Daily   atorvastatin  80 mg Oral Daily   carvedilol  25 mg Oral BID   clopidogrel  75 mg Oral Daily   docusate sodium  100 mg Oral Q12H   empagliflozin  10 mg Oral QAC breakfast   enoxaparin (LOVENOX) injection  40 mg Subcutaneous Q24H   furosemide  60 mg Intravenous BID   gabapentin  100 mg Oral TID   hydrALAZINE  50 mg Oral TID   insulin aspart  0-15 Units Subcutaneous TID WC   insulin glargine-yfgn  15 Units Subcutaneous Daily   isosorbide mononitrate  60 mg Oral Daily   polyethylene glycol  17 g Oral Daily   sacubitril-valsartan  1 tablet Oral BID   sodium chloride flush  3 mL Intravenous Q12H   Continuous Infusions:  sodium chloride     PRN Meds: sodium chloride, acetaminophen, ondansetron (ZOFRAN) IV, sodium chloride flush   Vital Signs    Vitals:   03/09/21 1606 03/09/21 1608 03/10/21 0048 03/10/21 0501  BP: 103/75 103/75 98/62 92/74   Pulse: 70 68 77 72  Resp: Temp: (!) 97.5 F (36.4 C) (!) 97.5 F (36.4 C) 98.4 F (36.9 C) 97.9 F (36.6 C)  TempSrc: Oral Oral Oral Oral  SpO2: 98% 98% 90% 92%  Weight: 78.3 kg   77.5 kg  Height:  (1.753 m)       Intake/Output Summary (Last 24 hours) at 03/10/2021 0748 Last data filed at 03/10/2021 0100 Gross per 24 hour  Intake 240 ml  Output 3700 ml  Net -3460 ml   Last 3 Weights 03/10/2021 03/09/2021 02/03/2021  Weight (lbs) 170 lb 12.8 oz 172 lb 9.9 oz 161 lb  Weight (kg) 77.474 kg 78.3 kg 73.029 kg      Telemetry    Sinus rhythm- Personally Reviewed  ECG    None today- Personally Reviewed  Physical Exam    GEN: No acute distress at rest, but is short of breath with minimal exertion on O2 Neck: JVD 10 cm Cardiac: RRR, soft murmurs, rubs, or gallops.  Respiratory: Rales bases bilaterally. GI: Soft, nontender, non-distended  MS: 1+ edema; No deformity. Neuro:  Nonfocal  Psych: Normal affect   Labs    High Sensitivity Troponin:   Recent Labs  Lab 03/08/21 0258 03/08/21 0527  TROPONINIHS 20* 22*     Chemistry Recent Labs  Lab 03/08/21 0258 03/08/21 0316 03/08/21 0527 03/09/21 0303 03/10/21 0351  NA 137 140  --  139 144  K 4.5 4.4  --  4.4 4.0  CL 100  --   --  101 102  CO2 26  --   --  31 34*  GLUCOSE 298*  --   --  347* 169*  BUN 20  --   --  19 21*  CREATININE 1.43*  --   --  1.48* 1.56*  CALCIUM 8.6*  --   --  8.1* 8.3*  MG  --   --  2.0 1.7 1.9  ALBUMIN  --   --   --  2.1* 2.1*  GFRNONAA 57*  --   --  55* 51*  ANIONGAP 11  --   --  7 8    Lipids No results for input(s): CHOL, TRIG, HDL, LABVLDL, LDLCALC, CHOLHDL in the last 168 hours.  Hematology Recent Labs  Lab 03/08/21 0258 03/08/21 0316 03/09/21 0303 03/10/21 0351  WBC 7.1  --  5.8 6.7  RBC 5.49  --  4.51 4.74  HGB 15.2 17.0 12.7* 13.2  HCT 48.4 50.0 39.1 39.8  MCV 88.2  --  86.7 84.0  MCH 27.7  --  28.2 27.8  MCHC 31.4  --  32.5 33.2  RDW 14.1  --  13.8 13.8  PLT 310  --  239 267   Thyroid No results for input(s): TSH, FREET4 in the last 168 hours.  BNP Recent Labs  Lab 03/08/21 0258 03/09/21 0304  BNP 3,620.7* 2,895.4*    DDimer No results for input(s): DDIMER in the last 168 hours.  Lab Results  Component Value Date   HGBA1C >14.0 (H) 02/03/2021    Radiology    ECHOCARDIOGRAM COMPLETE  Result Date: 03/09/2021    ECHOCARDIOGRAM REPORT   Patient Name:   Thomas West Date of Exam: 03/09/2021 Medical Rec #:  [id number]     Height:       69.0 in Accession #:    [id number]    Weight:       161.0 lb Date of Birth:  01-Aug-1960     BSA:          1.884 m Patient Age:    59 years      BP:            115/98 mmHg Patient Gender: M             HR:           64 bpm. Exam Location:  Inpatient Procedure: 2D Echo, Cardiac Doppler and Color Doppler Indications:    CHF- Acute systolic  History:        Patient has no prior history of Echocardiogram examinations.                 CHF, CAD and Acute MI, Stroke; Risk Factors:Diabetes and                 Hypertension.  Sonographer:    Gertie Fey MHA, RDMS, RVT, RDCS Referring Phys: 4 JARED M GARDNER IMPRESSIONS  1. Left ventricular ejection fraction, by estimation, is 20 to 25%. The left ventricle has severely decreased function. The left ventricle demonstrates global hypokinesis. Left ventricular diastolic parameters are consistent with Grade II diastolic dysfunction (pseudonormalization).  2. Right ventricular systolic function is normal. The right ventricular size is mildly enlarged. There is normal pulmonary artery systolic pressure. The estimated right ventricular systolic pressure is 24.0 mmHg.  3. Right atrial size was mildly dilated.  4. The mitral valve is normal in structure. Trivial mitral valve regurgitation. No evidence of mitral stenosis.  5. Tricuspid valve regurgitation is moderate.  6. The aortic valve is tricuspid. Aortic valve regurgitation is not visualized. Mild aortic valve sclerosis is present, with no evidence of aortic valve stenosis.  7. The inferior vena cava is normal in size with greater than 50% respiratory variability, suggesting right atrial pressure of 3 mmHg. FINDINGS  Left Ventricle: Left ventricular ejection fraction, by estimation, is 20 to 25%. The left ventricle has severely decreased function. The left ventricle demonstrates global hypokinesis. The left ventricular internal cavity size  was normal in size. There is no left ventricular hypertrophy. Left ventricular diastolic parameters are consistent with Grade II diastolic dysfunction (pseudonormalization). Right Ventricle: The right ventricular size is mildly enlarged. No  increase in right ventricular wall thickness. Right ventricular systolic function is normal. There is normal pulmonary artery systolic pressure. The tricuspid regurgitant velocity is 2.29  m/s, and with an assumed right atrial pressure of 3 mmHg, the estimated right ventricular systolic pressure is 24.0 mmHg. Left Atrium: Left atrial size was normal in size. Right Atrium: Right atrial size was mildly dilated. Pericardium: There is no evidence of pericardial effusion. Mitral Valve: The mitral valve is normal in structure. There is mild thickening of the mitral valve leaflet(s). There is mild calcification of the mitral valve leaflet(s). Trivial mitral valve regurgitation. No evidence of mitral valve stenosis. Tricuspid Valve: The tricuspid valve is normal in structure. Tricuspid valve regurgitation is moderate . No evidence of tricuspid stenosis. Aortic Valve: The aortic valve is tricuspid. Aortic valve regurgitation is not visualized. Mild aortic valve sclerosis is present, with no evidence of aortic valve stenosis. Aortic valve mean gradient measures 2.5 mmHg. Aortic valve peak gradient measures 4.8 mmHg. Aortic valve area, by VTI measures 1.58 cm. Pulmonic Valve: The pulmonic valve was normal in structure. Pulmonic valve regurgitation is not visualized. No evidence of pulmonic stenosis. Aorta: The aortic root is normal in size and structure. Venous: The inferior vena cava is normal in size with greater than 50% respiratory variability, suggesting right atrial pressure of 3 mmHg. IAS/Shunts: No atrial level shunt detected by color flow Doppler.  LEFT VENTRICLE PLAX 2D LVIDd:         4.90 cm      Diastology LVIDs:         4.50 cm      LV e' medial:    4.19 cm/s LV PW:         1.00 cm      LV E/e' medial:  11.3 LV IVS:        1.00 cm      LV e' lateral:   6.53 cm/s LVOT diam:     2.00 cm      LV E/e' lateral: 7.3 LV SV:         36 LV SV Index:   19 LVOT Area:     3.14 cm  LV Volumes (MOD) LV vol d, MOD A2C: 124.0  ml LV vol d, MOD A4C: 99.9 ml LV vol s, MOD A2C: 92.8 ml LV vol s, MOD A4C: 79.9 ml LV SV MOD A2C:     31.2 ml LV SV MOD A4C:     99.9 ml LV SV MOD BP:      25.6 ml RIGHT VENTRICLE RV S prime:     5.36 cm/s TAPSE (M-mode): 1.3 cm LEFT ATRIUM             Index        RIGHT ATRIUM           Index LA diam:        3.90 cm 2.07 cm/m   RA Area:     21.30 cm LA Vol (A2C):   51.5 ml 27.33 ml/m  RA Volume:   66.20 ml  35.14 ml/m LA Vol (A4C):   56.1 ml 29.78 ml/m LA Biplane Vol: 56.7 ml 30.09 ml/m  AORTIC VALVE AV Area (Vmax):    1.97 cm AV Area (Vmean):   1.83 cm AV Area (VTI):  1.58 cm AV Vmax:           109.00 cm/s AV Vmean:          72.500 cm/s AV VTI:            0.227 m AV Peak Grad:      4.8 mmHg AV Mean Grad:      2.5 mmHg LVOT Vmax:         68.40 cm/s LVOT Vmean:        42.200 cm/s LVOT VTI:          0.114 m LVOT/AV VTI ratio: 0.50  AORTA Ao Root diam: 3.00 cm MITRAL VALVE               TRICUSPID VALVE MV Area (PHT): 4.71 cm    TR Peak grad:   21.0 mmHg MV Decel Time: 161 msec    TR Vmax:        229.00 cm/s MV E velocity: 47.50 cm/s MV A velocity: 39.55 cm/s  SHUNTS MV E/A ratio:  1.20        Systemic VTI:  0.11 m                            Systemic Diam: 2.00 cm Donato Schultz MD Electronically signed by Donato Schultz MD Signature Date/Time: 03/09/2021/1:53:07 PM    Final     Cardiac Studies   ECHO: 03/09/2021 1. Left ventricular ejection fraction, by estimation, is 20 to 25%. The  left ventricle has severely decreased function. The left ventricle  demonstrates global hypokinesis. Left ventricular diastolic parameters are consistent with Grade II diastolic dysfunction (pseudonormalization).   2. Right ventricular systolic function is normal. The right ventricular  size is mildly enlarged. There is normal pulmonary artery systolic  pressure. The estimated right ventricular systolic pressure is 24.0 mmHg.   3. Right atrial size was mildly dilated.   4. The mitral valve is normal in structure.  Trivial mitral valve  regurgitation. No evidence of mitral stenosis.   5. Tricuspid valve regurgitation is moderate.   6. The aortic valve is tricuspid. Aortic valve regurgitation is not  visualized. Mild aortic valve sclerosis is present, with no evidence of aortic valve stenosis.   7. The inferior vena cava is normal in size with greater than 50%  respiratory variability, suggesting right atrial pressure of 3 mmHg.   Patient Profile     60 y.o. male with a history of HFrEF, CAD, DM2, HTN, HLD, CKD II-III, was admitted 10/17 with CHF.  Assessment & Plan    ACute on chronic combined systolic and diastolic CHF -Continue diuresis intake/output net -5.5 L so far - Weight down 2 pounds, currently 170.8 pounds, dry weight unclear - He is symptomatically improved, but still has a long way to go -His BUN and creatinine are trending up slightly, continue to follow -Continue diuresis - He is currently on Lasix 60 mg IV twice daily, carvedilol 25 mg twice daily, hydralazine 50 mg 3 times daily, Imdur 60 mg daily, Entresto 24-26 mg daily - At this time, SBP 90s-100s but he is tolerating it well.  Continue current therapy - Otherwise, per IM     For questions or updates, please contact [facility] Please consult [email or web address] for contact info under        Signed, Theodore Demark, PA-C  03/10/2021, 7:48 AM     Patient examined chart reviewed Still with LE edema and JVP but lungs  and clinical symptoms improved Not sure he will tolerate both hydralazine/nitrates and entresto follow Cr with diuresis I discussed right and left cath with him since he has had previous stents and EF severely reduced presenting with acute systolic CHF  He is willing to have it done Will tentatively scheudle for Thursday  Charlton Haws MD [facility]

## 2021-03-10 NOTE — Progress Notes (Signed)
Inpatient Diabetes Program Recommendations  AACE/ADA: New Consensus Statement on Inpatient Glycemic Control (2015)  Target Ranges:  Prepandial:   less than 140 mg/dL      Peak postprandial:   less than 180 mg/dL (1-2 hours)      Critically ill patients:  140 - 180 mg/dL   Lab Results  Component Value Date   GLUCAP 174 (H) 03/10/2021   HGBA1C >14.0 (H) 02/03/2021    Review of Glycemic Control  Inpatient Diabetes Program Recommendations:   Spoke with pt about A1C >14.0 (average blood glucose 355 over the past 2-3 months)  and explained what an A1C is, basic pathophysiology of DM Type 2, basic home care, basic diabetes diet nutrition principles, importance of checking CBGs and maintaining good CBG control to prevent long-term and short-term complications. Reviewed signs and symptoms of hyperglycemia and hypoglycemia and how to treat hypoglycemia at home. Also reviewed blood sugar goals at home.   Patient states he has not been taking any insulin although Basaglar was listed in home medications.  Thank you, Thomas West. Thomas Mcnelly, RN, MSN, CDE  Diabetes Coordinator Inpatient Glycemic Control Team Team Pager [phone] (8am-5pm) 03/10/2021 2:52 PM

## 2021-03-10 NOTE — Progress Notes (Signed)
OT Cancellation Note  Patient Details Name: Thomas West MRN: [id number] DOB: [birth year]   Cancelled Treatment:    Reason Eval/Treat Not Completed: Fatigue/lethargy limiting ability to participate On entry, pt resting in bed and easily awakens to voice. However, pt unable to sustain alertness long enough to participate in OT eval. Will follow-up as schedule permits, likely tomorrow.   Lorre Munroe 03/10/2021, 2:31 PM

## 2021-03-10 NOTE — Progress Notes (Signed)
PROGRESS NOTE  Thomas West YYF:[id number] DOB: 1961/03/07   PCP: [name], [name], MD  Patient is from: Home  DOA: 03/08/2021 LOS: 1  Chief complaints:  Chief Complaint  Patient presents with   Shortness of Breath     Brief Narrative / Interim history: 60 year old male with history of systolic CHF, CAD, DM-2, HTN, CVA and CKD-3A presenting with progressive edema, orthopnea and dyspnea on exertion for about 2 weeks.  Patient moved from [place] to [place] about a year ago.  He has not established care with PCP, and has not been able to fill his medications.  He is not sure about taking diuretics.  He also admits to taking ibuprofen twice daily.  Hypertensive to 180/120s.  BNP elevated to 3600.  Admitted for acute on chronic systolic CHF with marked anasarca, and started on IV Lasix.   TTE with LVEF of 20 to 25%, GH, G2-DD, moderate TVR and RVSP of 24 mmHg.  Plan for [facility] on 10/20.  Remains on IV Lasix.   Subjective: Seen and examined earlier this morning.  No major events overnight of this morning.  No complaints.  He denies chest pain, shortness of breath, GI or UTI symptoms.  Doing well with subcu insulin.  He has agreed to try subcu Lovenox today.  Objective: Vitals:   03/10/21 0048 03/10/21 0501 03/10/21 0938 03/10/21 1142  BP: 98/62 92/74 101/74 (!) 104/52  Pulse: 77 72 76 71  Resp: 20 18 19 20   Temp: 98.4 F (36.9 C) 97.9 F (36.6 C) 98.5 F (36.9 C) 97.6 F (36.4 C)  TempSrc: Oral Oral Oral Oral  SpO2: 90% 92% 98% 96%  Weight:  77.5 kg    Height:        Intake/Output Summary (Last 24 hours) at 03/10/2021 1327 Last data filed at 03/10/2021 1244 Gross per 24 hour  Intake 480 ml  Output 4400 ml  Net -3920 ml    Filed Weights   03/09/21 1606 03/10/21 0501  Weight: 78.3 kg 77.5 kg    Examination:  GENERAL: No apparent distress.  Nontoxic. HEENT: MMM.  Vision and hearing grossly intact.  NECK: Supple.  Notable JVD. RESP: 96% on RA.  No IWOB.  Fair  aeration bilaterally. CVS:  RRR. Heart sounds normal.  ABD/GI/GU: BS+. Abd soft, NTND.  MSK/EXT:  Moves extremities. No apparent deformity.  1+ pitting edema in BLE. SKIN: no apparent skin lesion or wound NEURO: Awake and alert. Oriented appropriately.  No apparent focal neuro deficit. PSYCH: Calm. Normal affect.   Procedures:  None  Microbiology summarized: COVID-19 and influenza PCR nonreactive.  Assessment & Plan: Acute on chronic combined CHF: Elevated BNP to 3600. TTE with LVEF of 20 to 25%, GH, G2-DD, moderate TVR and RVSP of 24 mmHg.  Likely due to not taking his meds and NSAID abuse.   Still with some JVD and BLE edema.  About 3.7 L UOP/24 hours.  Net -5 L charted but he had more than that.  Creatinine slightly up. -Cardiology managing -Continue IV Lasix 60 mg twice daily -GDMT-Coreg, BiDil, Entresto and Jardiance -Plan for [facility] on 03/12/2021 -Monitor fluid status, renal functions and electrolytes -Sodium and fluid restrictions -Advised to avoid NSAIDs.  Hypertensive urgency: DBP as high as 140s in ED. normotensive. -Cardiac meds as above  History of CAD/stents in year: No chest pain.  TTE as above. -Cardiac meds as above -Continue home Plavix and aspirin -Plan for [facility] on 03/12/2021.  Uncontrolled NIDDM-2 with hyperglycemia CKD-3: A1c >  14%.  Patient was refusing CBG monitoring and insulin.  He is now willing to have CBG monitoring and subcu insulin. Recent Labs  Lab 03/09/21 1217 03/09/21 1610 03/09/21 2131 03/10/21 0617 03/10/21 1137  GLUCAP 170* 100* 145* 138* 174*  -Continue SSI-moderate -Continue basal at 15 units daily -Continue mealtime coverage at 6 units 3 times daily -Continue statin -Diabetic coordinator consulted.  CKD-3A: Cr slightly up today. Recent Labs    01/02/21 1308 02/03/21 0911 03/08/21 0258 03/09/21 0303 03/10/21 0351  BUN 18 15 20 19  21*  CREATININE 1.38* 1.46 1.43* 1.48* 1.56*  -Continue monitoring  History of CVA without  residual deficit -Continue Plavix, aspirin and statin for now  Body mass index is 25.22 kg/m.         DVT prophylaxis:  Now agreeable to subcu Lovenox Continue SCD as well  Code Status: Full code Family Communication: Patient and/or RN. Available if any question.  Level of care: Telemetry Cardiac Status is: Inpatient  Remains inpatient appropriate because: Significant fluid overload requiring IV diuretics and further cardiac evaluation (left heart catheterization)   Consultants:  Cardiology   Sch Meds:  Scheduled Meds:  aspirin EC  81 mg Oral Daily   atorvastatin  80 mg Oral Daily   carvedilol  25 mg Oral BID   clopidogrel  75 mg Oral Daily   docusate sodium  100 mg Oral Q12H   empagliflozin  10 mg Oral QAC breakfast   enoxaparin (LOVENOX) injection  40 mg Subcutaneous Q24H   furosemide  60 mg Intravenous BID   gabapentin  100 mg Oral TID   hydrALAZINE  50 mg Oral TID   insulin aspart  0-15 Units Subcutaneous TID WC   insulin glargine-yfgn  15 Units Subcutaneous Daily   isosorbide mononitrate  60 mg Oral Daily   polyethylene glycol  17 g Oral Daily   sacubitril-valsartan  1 tablet Oral BID   sodium chloride flush  3 mL Intravenous Q12H   Continuous Infusions:  sodium chloride     PRN Meds:.sodium chloride, acetaminophen, ondansetron (ZOFRAN) IV, sodium chloride flush  Antimicrobials: Anti-infectives (From admission, onward)    None        I have personally reviewed the following labs and images: CBC: Recent Labs  Lab 03/08/21 0258 03/08/21 0316 03/09/21 0303 03/10/21 0351  WBC 7.1  --  5.8 6.7  NEUTROABS 4.1  --   --   --   HGB 15.2 17.0 12.7* 13.2  HCT 48.4 50.0 39.1 39.8  MCV 88.2  --  86.7 84.0  PLT 310  --  239 267   BMP &GFR Recent Labs  Lab 03/08/21 0258 03/08/21 0316 03/08/21 0527 03/09/21 0303 03/10/21 0351  NA 137 140  --  139 144  K 4.5 4.4  --  4.4 4.0  CL 100  --   --  101 102  CO2 26  --   --  31 34*  GLUCOSE 298*  --    --  347* 169*  BUN 20  --   --  19 21*  CREATININE 1.43*  --   --  1.48* 1.56*  CALCIUM 8.6*  --   --  8.1* 8.3*  MG  --   --  2.0 1.7 1.9  PHOS  --   --   --  4.4 4.2   Estimated Creatinine Clearance: 52.2 mL/min (A) (by C-G formula based on SCr of 1.56 mg/dL (H)). Liver & Pancreas: Recent Labs  Lab 03/09/21 0303 03/10/21  0351  ALBUMIN 2.1* 2.1*   No results for input(s): LIPASE, AMYLASE in the last 168 hours. No results for input(s): AMMONIA in the last 168 hours. Diabetic: No results for input(s): HGBA1C in the last 72 hours. Recent Labs  Lab 03/09/21 1217 03/09/21 1610 03/09/21 2131 03/10/21 0617 03/10/21 1137  GLUCAP 170* 100* 145* 138* 174*   Cardiac Enzymes: No results for input(s): CKTOTAL, CKMB, CKMBINDEX, TROPONINI in the last 168 hours. Recent Labs    02/03/21 0911  PROBNP 3,012.0*   Coagulation Profile: No results for input(s): INR, PROTIME in the last 168 hours. Thyroid Function Tests: No results for input(s): TSH, T4TOTAL, FREET4, T3FREE, THYROIDAB in the last 72 hours. Lipid Profile: No results for input(s): CHOL, HDL, LDLCALC, TRIG, CHOLHDL, LDLDIRECT in the last 72 hours. Anemia Panel: No results for input(s): VITAMINB12, FOLATE, FERRITIN, TIBC, IRON, RETICCTPCT in the last 72 hours. Urine analysis:    Component Value Date/Time   COLORURINE YELLOW 03/08/2021 0246   APPEARANCEUR HAZY (A) 03/08/2021 0246   LABSPEC 1.019 03/08/2021 0246   PHURINE 5.0 03/08/2021 0246   GLUCOSEU 150 (A) 03/08/2021 0246   HGBUR NEGATIVE 03/08/2021 0246   BILIRUBINUR NEGATIVE 03/08/2021 0246   BILIRUBINUR negative 01/02/2021 1312   KETONESUR NEGATIVE 03/08/2021 0246   PROTEINUR >=300 (A) 03/08/2021 0246   UROBILINOGEN 0.2 01/02/2021 1312   NITRITE NEGATIVE 03/08/2021 0246   LEUKOCYTESUR NEGATIVE 03/08/2021 0246   Sepsis Labs: Invalid input(s): PROCALCITONIN, LACTICIDVEN  Microbiology: Recent Results (from the past 240 hour(s))  Resp Panel by RT-PCR (Flu A&B,  Covid) Nasopharyngeal Swab     Status: None   Collection Time: 03/08/21  6:48 AM   Specimen: Nasopharyngeal Swab; Nasopharyngeal(NP) swabs in vial transport medium  Result Value Ref Range Status   SARS Coronavirus 2 by RT PCR NEGATIVE NEGATIVE Final    Comment: (NOTE) SARS-CoV-2 target nucleic acids are NOT DETECTED.  The SARS-CoV-2 RNA is generally detectable in upper respiratory specimens during the acute phase of infection. The lowest concentration of SARS-CoV-2 viral copies this assay can detect is 138 copies/mL. A negative result does not preclude SARS-Cov-2 infection and should not be used as the sole basis for treatment or other patient management decisions. A negative result may occur with  improper specimen collection/handling, submission of specimen other than nasopharyngeal swab, presence of viral mutation(s) within the areas targeted by this assay, and inadequate number of viral copies(<138 copies/mL). A negative result must be combined with clinical observations, patient history, and epidemiological information. The expected result is Negative.  Fact Sheet for Patients:  BloggerCourse.com  Fact Sheet for Healthcare Providers:  SeriousBroker.it  This test is no t yet approved or cleared by the [place] FDA and  has been authorized for detection and/or diagnosis of SARS-CoV-2 by FDA under an Emergency Use Authorization (EUA). This EUA will remain  in effect (meaning this test can be used) for the duration of the COVID-19 declaration under Section 564(b)(1) of the Act, 21 U.S.C.section 360bbb-3(b)(1), unless the authorization is terminated  or revoked sooner.       Influenza A by PCR NEGATIVE NEGATIVE Final   Influenza B by PCR NEGATIVE NEGATIVE Final    Comment: (NOTE) The Xpert Xpress SARS-CoV-2/FLU/RSV plus assay is intended as an aid in the diagnosis of influenza from Nasopharyngeal swab specimens and should  not be used as a sole basis for treatment. Nasal washings and aspirates are unacceptable for Xpert Xpress SARS-CoV-2/FLU/RSV testing.  Fact Sheet for Patients: BloggerCourse.com  Fact Sheet for  Healthcare Providers: SeriousBroker.it  This test is not yet approved or cleared by the [place] and has been authorized for detection and/or diagnosis of SARS-CoV-2 by FDA under an Emergency Use Authorization (EUA). This EUA will remain in effect (meaning this test can be used) for the duration of the COVID-19 declaration under Section 564(b)(1) of the Act, 21 U.S.C. section 360bbb-3(b)(1), unless the authorization is terminated or revoked.  Performed at [facility], [address], [place], [place] [address]     Radiology Studies: No results found.     Phuoc Huy T. Serayah Yazdani [facility]  If 7PM-7AM, please contact night-coverage [email or web address] 03/10/2021, 1:27 PM

## 2021-03-11 ENCOUNTER — Encounter (HOSPITAL_COMMUNITY)
Admission: EM | Disposition: A | Discharge status: Home Health | Payer: Self-pay | Source: Home / Self Care | Attending: Internal Medicine

## 2021-03-11 ENCOUNTER — Encounter (HOSPITAL_COMMUNITY): Payer: Self-pay | Admitting: Internal Medicine

## 2021-03-11 DIAGNOSIS — I2510 Atherosclerotic heart disease of native coronary artery without angina pectoris: Secondary | ICD-10-CM

## 2021-03-11 DIAGNOSIS — I5023 Acute on chronic systolic (congestive) heart failure: Secondary | ICD-10-CM | POA: Diagnosis not present

## 2021-03-11 HISTORY — PX: RIGHT/LEFT HEART CATH AND CORONARY ANGIOGRAPHY: CATH118266

## 2021-03-11 LAB — CBC
HCT: 38.7 % — ABNORMAL LOW (ref 39.0–52.0)
Hemoglobin: 12.7 g/dL — ABNORMAL LOW (ref 13.0–17.0)
MCH: 27.8 pg (ref 26.0–34.0)
MCHC: 32.8 g/dL (ref 30.0–36.0)
MCV: 84.7 fL (ref 80.0–100.0)
Platelets: 267 10*3/uL (ref 150–400)
RBC: 4.57 MIL/uL (ref 4.22–5.81)
RDW: 14 % (ref 11.5–15.5)
WBC: 7.2 10*3/uL (ref 4.0–10.5)
nRBC: 0 % (ref 0.0–0.2)

## 2021-03-11 LAB — BASIC METABOLIC PANEL
Anion gap: 11 (ref 5–15)
BUN: 19 mg/dL (ref 6–20)
CO2: 33 mmol/L — ABNORMAL HIGH (ref 22–32)
Calcium: 8.1 mg/dL — ABNORMAL LOW (ref 8.9–10.3)
Chloride: 99 mmol/L (ref 98–111)
Creatinine, Ser: 1.65 mg/dL — ABNORMAL HIGH (ref 0.61–1.24)
GFR, Estimated: 48 mL/min — ABNORMAL LOW (ref >60)
Glucose, Bld: 100 mg/dL — ABNORMAL HIGH (ref 70–99)
Potassium: 3.8 mmol/L (ref 3.5–5.1)
Sodium: 143 mmol/L (ref 135–145)

## 2021-03-11 LAB — POCT I-STAT 7, (LYTES, BLD GAS, ICA,H+H)
Acid-Base Excess: 10 mmol/L — ABNORMAL HIGH (ref 0.0–2.0)
Bicarbonate: 37 mmol/L — ABNORMAL HIGH (ref 20.0–28.0)
Calcium, Ion: 1.07 mmol/L — ABNORMAL LOW (ref 1.15–1.40)
HCT: 39 % (ref 39.0–52.0)
Hemoglobin: 13.3 g/dL (ref 13.0–17.0)
O2 Saturation: 98 %
Potassium: 3.8 mmol/L (ref 3.5–5.1)
Sodium: 143 mmol/L (ref 135–145)
TCO2: 39 mmol/L — ABNORMAL HIGH (ref 22–32)
pCO2 arterial: 58.5 mmHg — ABNORMAL HIGH (ref 32.0–48.0)
pH, Arterial: 7.409 (ref 7.350–7.450)
pO2, Arterial: 117 mmHg — ABNORMAL HIGH (ref 83.0–108.0)

## 2021-03-11 LAB — POCT I-STAT EG7
Acid-Base Excess: 10 mmol/L — ABNORMAL HIGH (ref 0.0–2.0)
Acid-Base Excess: 11 mmol/L — ABNORMAL HIGH (ref 0.0–2.0)
Bicarbonate: 38 mmol/L — ABNORMAL HIGH (ref 20.0–28.0)
Bicarbonate: 39.3 mmol/L — ABNORMAL HIGH (ref 20.0–28.0)
Calcium, Ion: 1.06 mmol/L — ABNORMAL LOW (ref 1.15–1.40)
Calcium, Ion: 1.12 mmol/L — ABNORMAL LOW (ref 1.15–1.40)
HCT: 39 % (ref 39.0–52.0)
HCT: 40 % (ref 39.0–52.0)
Hemoglobin: 13.3 g/dL (ref 13.0–17.0)
Hemoglobin: 13.6 g/dL (ref 13.0–17.0)
O2 Saturation: 76 %
O2 Saturation: 78 %
Potassium: 3.7 mmol/L (ref 3.5–5.1)
Potassium: 3.9 mmol/L (ref 3.5–5.1)
Sodium: 143 mmol/L (ref 135–145)
Sodium: 144 mmol/L (ref 135–145)
TCO2: 40 mmol/L — ABNORMAL HIGH (ref 22–32)
TCO2: 41 mmol/L — ABNORMAL HIGH (ref 22–32)
pCO2, Ven: 63.4 mmHg — ABNORMAL HIGH (ref 44.0–60.0)
pCO2, Ven: 65.3 mmHg — ABNORMAL HIGH (ref 44.0–60.0)
pH, Ven: 7.386 (ref 7.250–7.430)
pH, Ven: 7.388 (ref 7.250–7.430)
pO2, Ven: 43 mmHg (ref 32.0–45.0)
pO2, Ven: 45 mmHg (ref 32.0–45.0)

## 2021-03-11 LAB — GLUCOSE, CAPILLARY
Glucose-Capillary: 101 mg/dL — ABNORMAL HIGH (ref 70–99)
Glucose-Capillary: 68 mg/dL — ABNORMAL LOW (ref 70–99)
Glucose-Capillary: 85 mg/dL (ref 70–99)
Glucose-Capillary: 185 mg/dL — ABNORMAL HIGH (ref 70–99)

## 2021-03-11 LAB — MAGNESIUM: Magnesium: 1.8 mg/dL (ref 1.7–2.4)

## 2021-03-11 SURGERY — RIGHT/LEFT HEART CATH AND CORONARY ANGIOGRAPHY

## 2021-03-11 MED ORDER — ACETAMINOPHEN 325 MG PO TABS
650.0000 mg | ORAL_TABLET | ORAL | Status: DC | PRN
  Administered 2021-03-15: 650 mg via ORAL

## 2021-03-11 MED ORDER — HEPARIN (PORCINE) IN NACL 1000-0.9 UT/500ML-% IV SOLN
INTRAVENOUS | Status: DC | PRN
  Administered 2021-03-11 (×2): 500 mL

## 2021-03-11 MED ORDER — MIDAZOLAM HCL 2 MG/2ML IJ SOLN
INTRAMUSCULAR | Status: AC
  Filled 2021-03-11: qty 2

## 2021-03-11 MED ORDER — ASPIRIN EC 81 MG PO TBEC
81.0000 mg | DELAYED_RELEASE_TABLET | Freq: Every day | ORAL | Status: AC
  Filled 2021-03-11: qty 1

## 2021-03-11 MED ORDER — LABETALOL HCL 5 MG/ML IV SOLN: 10.0000 mg | INTRAVENOUS | Status: DC | PRN

## 2021-03-11 MED ORDER — ASPIRIN 81 MG PO CHEW
81.0000 mg | CHEWABLE_TABLET | ORAL | Status: DC
Start: 2021-03-12 — End: 2021-03-11

## 2021-03-11 MED ORDER — HEPARIN SODIUM (PORCINE) 1000 UNIT/ML IJ SOLN
INTRAMUSCULAR | Status: AC
  Filled 2021-03-11: qty 1

## 2021-03-11 MED ORDER — LIDOCAINE HCL (PF) 1 % IJ SOLN
INTRAMUSCULAR | Status: AC
  Filled 2021-03-11: qty 30

## 2021-03-11 MED ORDER — INSULIN ASPART 100 UNIT/ML IJ SOLN
0.0000 [IU] | Freq: Three times a day (TID) | INTRAMUSCULAR | Status: DC
  Administered 2021-03-12: 8 [IU] via SUBCUTANEOUS
  Administered 2021-03-12: 2 [IU] via SUBCUTANEOUS
  Administered 2021-03-12: 5 [IU] via SUBCUTANEOUS
  Administered 2021-03-13: 2 [IU] via SUBCUTANEOUS
  Administered 2021-03-13: 3 [IU] via SUBCUTANEOUS
  Administered 2021-03-13: 5 [IU] via SUBCUTANEOUS
  Administered 2021-03-14 (×2): 3 [IU] via SUBCUTANEOUS
  Administered 2021-03-14: 8 [IU] via SUBCUTANEOUS
  Administered 2021-03-15 – 2021-03-16 (×3): 3 [IU] via SUBCUTANEOUS

## 2021-03-11 MED ORDER — SODIUM CHLORIDE 0.9% FLUSH: 3.0000 mL | Freq: Two times a day (BID) | INTRAVENOUS | Status: DC

## 2021-03-11 MED ORDER — ASPIRIN 81 MG PO CHEW
81.0000 mg | CHEWABLE_TABLET | ORAL | Status: AC
  Administered 2021-03-11: 81 mg via ORAL
  Filled 2021-03-11: qty 1

## 2021-03-11 MED ORDER — VERAPAMIL HCL 2.5 MG/ML IV SOLN
INTRAVENOUS | Status: AC
  Filled 2021-03-11: qty 2

## 2021-03-11 MED ORDER — LIDOCAINE HCL (PF) 1 % IJ SOLN
INTRAMUSCULAR | Status: DC | PRN
  Administered 2021-03-11 (×3): 2 mL

## 2021-03-11 MED ORDER — ONDANSETRON HCL 4 MG/2ML IJ SOLN: 4.0000 mg | Freq: Four times a day (QID) | INTRAMUSCULAR | Status: DC | PRN

## 2021-03-11 MED ORDER — SODIUM CHLORIDE 0.9 % IV SOLN
250.0000 mL | INTRAVENOUS | Status: DC | PRN
Start: 2021-03-11 — End: 2021-03-11

## 2021-03-11 MED ORDER — SODIUM CHLORIDE 0.9% FLUSH
3.0000 mL | Freq: Two times a day (BID) | INTRAVENOUS | Status: DC
  Administered 2021-03-12 – 2021-03-17 (×10): 3 mL via INTRAVENOUS

## 2021-03-11 MED ORDER — FENTANYL CITRATE (PF) 100 MCG/2ML IJ SOLN
INTRAMUSCULAR | Status: AC
  Filled 2021-03-11: qty 2

## 2021-03-11 MED ORDER — IOHEXOL 350 MG/ML SOLN
INTRAVENOUS | Status: DC | PRN
  Administered 2021-03-11: 50 mL via INTRA_ARTERIAL

## 2021-03-11 MED ORDER — HEPARIN (PORCINE) IN NACL 1000-0.9 UT/500ML-% IV SOLN
INTRAVENOUS | Status: AC
  Filled 2021-03-11: qty 1000

## 2021-03-11 MED ORDER — SODIUM CHLORIDE 0.9% FLUSH
3.0000 mL | INTRAVENOUS | Status: DC | PRN
Start: 2021-03-11 — End: 2021-03-11

## 2021-03-11 MED ORDER — SODIUM CHLORIDE 0.9 % IV SOLN: INTRAVENOUS | Status: DC

## 2021-03-11 MED ORDER — ASPIRIN EC 81 MG PO TBEC
81.0000 mg | DELAYED_RELEASE_TABLET | Freq: Every day | ORAL | Status: DC
  Administered 2021-03-13 – 2021-03-17 (×5): 81 mg via ORAL
  Filled 2021-03-11 (×5): qty 1

## 2021-03-11 MED ORDER — ASPIRIN 81 MG PO CHEW: 81.0000 mg | CHEWABLE_TABLET | ORAL | Status: DC

## 2021-03-11 MED ORDER — SODIUM CHLORIDE 0.9% FLUSH: 3.0000 mL | INTRAVENOUS | Status: DC | PRN

## 2021-03-11 MED ORDER — FENTANYL CITRATE (PF) 100 MCG/2ML IJ SOLN
INTRAMUSCULAR | Status: DC | PRN
  Administered 2021-03-11: 25 ug via INTRAVENOUS

## 2021-03-11 MED ORDER — MIDAZOLAM HCL 2 MG/2ML IJ SOLN
INTRAMUSCULAR | Status: DC | PRN
  Administered 2021-03-11: 1 mg via INTRAVENOUS

## 2021-03-11 MED ORDER — HEPARIN SODIUM (PORCINE) 1000 UNIT/ML IJ SOLN
INTRAMUSCULAR | Status: DC | PRN
  Administered 2021-03-11: 5000 [IU] via INTRAVENOUS

## 2021-03-11 MED ORDER — SODIUM CHLORIDE 0.9 % IV SOLN: 250.0000 mL | INTRAVENOUS | Status: DC | PRN

## 2021-03-11 MED ORDER — VERAPAMIL HCL 2.5 MG/ML IV SOLN
INTRAVENOUS | Status: DC | PRN
  Administered 2021-03-11: 10 mL via INTRA_ARTERIAL

## 2021-03-11 MED ORDER — HYDRALAZINE HCL 20 MG/ML IJ SOLN: 10.0000 mg | INTRAMUSCULAR | Status: DC | PRN

## 2021-03-11 SURGICAL SUPPLY — 16 items
CATH BALLN WEDGE 5F 110CM (CATHETERS) ×2 IMPLANT
CATH DIAG 6FR JR4 (CATHETERS) ×2 IMPLANT
CATH DIAG 6FR PIGTAIL ANGLED (CATHETERS) ×2 IMPLANT
CATH INFINITI 6F FL3.5 (CATHETERS) ×2 IMPLANT
DEVICE RAD COMP TR BAND LRG (VASCULAR PRODUCTS) ×2 IMPLANT
GLIDESHEATH SLEND SS 6F .021 (SHEATH) ×2 IMPLANT
GUIDEWIRE INQWIRE 1.5J.035X260 (WIRE) ×1 IMPLANT
GUIDEWIRE TIGER .035X300 (WIRE) ×2 IMPLANT
INQWIRE 1.5J .035X260CM (WIRE) ×2
KIT HEART LEFT (KITS) ×2 IMPLANT
KIT MICROPUNCTURE NIT STIFF (SHEATH) ×2 IMPLANT
PACK CARDIAC CATHETERIZATION (CUSTOM PROCEDURE TRAY) ×2 IMPLANT
SHEATH GLIDE SLENDER 4/5FR (SHEATH) ×2 IMPLANT
SHEATH PROBE COVER 6X72 (BAG) ×2 IMPLANT
TRANSDUCER W/STOPCOCK (MISCELLANEOUS) ×2 IMPLANT
TUBING CIL FLEX 10 FLL-RA (TUBING) ×2 IMPLANT

## 2021-03-11 NOTE — Interval H&P Note (Signed)
History and Physical Interval Note:  03/11/2021 9:37 AM  [name]  has presented today for surgery, with the diagnosis of acute on chronic CHF.  The various methods of treatment have been discussed with the patient and family. After consideration of risks, benefits and other options for treatment, the patient has consented to  Procedure(s): RIGHT/LEFT HEART CATH AND CORONARY ANGIOGRAPHY (N/A) as a surgical intervention.  The patient's history has been reviewed, patient examined, no change in status, stable for surgery.  I have reviewed the patient's chart and labs.  Questions were answered to the patient's satisfaction.    Cath Lab Visit (complete for each Cath Lab visit)  Clinical Evaluation Leading to the Procedure:   ACS: No.  Non-ACS:    Anginal Classification: No Symptoms  Anti-ischemic medical therapy: Minimal Therapy (1 class of medications)  Non-Invasive Test Results: No non-invasive testing performed  Prior CABG: No previous CABG        Orbie Pyo

## 2021-03-11 NOTE — Progress Notes (Signed)
   Pt arrived to unit. Pt currently in bed with eyes closed.     03/11/21 1049  First Vascular Site Assessment  #1 - Location of Site Assessment Right radial  #1 - Vascular Site Assessment Scale Level 0  #1 - Hematoma present? No  #1 - Air in TR Band 10 cc  #1 - Dressing Status Clean;Dry;Intact  Second Vascular Site Assessment  #2 - Location of Site Assessment Other (Comment) (Right IJ)  #2 - Vascular Site Assessment Scale Level 0  #2 - Hematoma present? No  #2 - Dressing Type Transparent dressing  #2 - Dressing Status Clean;Dry;Intact  RUE Neurovascular Assessment  R Radial Pulse +2

## 2021-03-11 NOTE — Progress Notes (Signed)
PT Cancellation Note  Patient Details Name: Derion Kreiter MRN: [id number] DOB: [birth year]   Cancelled Treatment:    Reason Eval/Treat Not Completed: Patient at procedure or test/unavailable (heart cath).  Lillia Pauls, PT, DPT Acute Rehabilitation Services Pager [phone] Office [phone]    Norval Morton 03/11/2021, 10:12 AM

## 2021-03-11 NOTE — Evaluation (Signed)
Occupational Therapy Evaluation Patient Details Name: Thomas West MRN: [id number] DOB: 03/28/1961 Today's Date: 03/11/2021   History of Present Illness Thomas West is a 60 y.o. male with medical history significant of HFrEF, prior MI, DM2, HTN. Pt presents to ED with 2+ weeks of worsening peripheral edema, anasarca. Now up to abdomen. Associated orthopnea, DOE, no CP. Patient planning for cardiac cath on Thursday 10/20.   Clinical Impression   PTA, pt lives with 71 year old son and reports typically Independent with ADLs, IADLs and mobility without AD. Pt presents now with minor deficits in endurance, dynamic standing balance and decreased overall awareness of medical complexities. Pt able to mobilize in room without AD though min guard needed due to shakiness with prolonged standing. Pt overall setup for UB ADLs and Min A for LB ADLs. Provided CHF & Self Care handout with further reinforcement needed. Also discussed medication compliance with plan to assess med mgmt via pill box test in next session to minimize risk of CHF exacerbation. Anticipate no OT needs at DC though will continue to follow acutely.       Recommendations for follow up therapy are one component of a multi-disciplinary discharge planning process, led by the attending physician.  Recommendations may be updated based on patient status, additional functional criteria and insurance authorization.   Follow Up Recommendations  No OT follow up    Equipment Recommendations  None recommended by OT    Recommendations for Other Services       Precautions / Restrictions Precautions Precautions: Fall Restrictions Weight Bearing Restrictions: No      Mobility Bed Mobility Overal bed mobility: Modified Independent             General bed mobility comments: light use of bedrails    Transfers Overall transfer level: Needs assistance Equipment used: None Transfers: Sit to/from Stand Sit to Stand: Supervision          General transfer comment: increased time to rise, noted to reach out to IV pole    Balance Overall balance assessment: Needs assistance Sitting-balance support: Feet supported Sitting balance-Leahy Scale: Good     Standing balance support: Single extremity supported;During functional activity Standing balance-Leahy Scale: Fair Standing balance comment: intermittent reaching out to IV pole for support                           ADL either performed or assessed with clinical judgement   ADL Overall ADL's : Needs assistance/impaired Eating/Feeding: Independent   Grooming: Supervision/safety;Standing;Wash/dry face   Upper Body Bathing: Supervision/ safety;Standing Upper Body Bathing Details (indicate cue type and reason): to bathe under arms Lower Body Bathing: Sit to/from stand;Minimal assistance Lower Body Bathing Details (indicate cue type and reason): Min A for thorough peri care as B LE noted to become shaky in standing, fatiguing with task Upper Body Dressing : Set up;Sitting   Lower Body Dressing: Set up Lower Body Dressing Details (indicate cue type and reason): donned slides rather than socks Toilet Transfer: Min guard;Ambulation   Toileting- Clothing Manipulation and Hygiene: Supervision/safety;Sit to/from stand       Functional mobility during ADLs: Min guard General ADL Comments: noted to fatigue with basic bathing tasks standing at sink with B LE shakiness, unsure if due to NPO, weakness, etc. Denies excessive SOB. Provided CHF & Self Care handout - focus on weighing self, pill box for meds to maintain compliance, fluid restrictions and salt intake.     Vision  Baseline Vision/History: 1 Wears glasses (reading glasses) Ability to See in Adequate Light: 0 Adequate Patient Visual Report: No change from baseline Vision Assessment?: No apparent visual deficits     Perception     Praxis      Pertinent Vitals/Pain Pain Assessment: No/denies pain      Hand Dominance Right   Extremity/Trunk Assessment Upper Extremity Assessment Upper Extremity Assessment: Generalized weakness   Lower Extremity Assessment Lower Extremity Assessment: Defer to PT evaluation   Cervical / Trunk Assessment Cervical / Trunk Assessment: Normal   Communication Communication Communication: No difficulties   Cognition Arousal/Alertness: Awake/alert Behavior During Therapy: Flat affect Overall Cognitive Status: No family/caregiver present to determine baseline cognitive functioning                                 General Comments: some questionable impairments in awareness of medical complexities and health literacy. Noted in chart, pt moved from to [place] a year ago and never established PCP to continue taking medications. pt denies any issues in obtaining medicines - will need to be further assessed   General Comments       Exercises     Shoulder Instructions      Home Living Family/patient expects to be discharged to:: Private residence Living Arrangements: Children (71 y/o son) Available Help at Discharge: Family;Available PRN/intermittently Type of Home: House Home Access: Stairs to enter [employer] of Steps: 1   Home Layout: Two level;Bed/bath upstairs     Bathroom Shower/Tub: Producer, television/film/video: Standard     Home Equipment: Environmental consultant - 2 wheels;Shower seat          Prior Functioning/Environment Level of Independence: Independent        Comments: denies use of AD, drives, grocery shops, stands for showers. enjoys fishing        OT Problem List: Decreased strength;Decreased activity tolerance;Impaired balance (sitting and/or standing)      OT Treatment/Interventions: Self-care/ADL training;Therapeutic exercise;Energy conservation;DME and/or AE instruction;Therapeutic activities;Patient/family education;Balance training    OT Goals(Current goals can be found in the care plan section)  Acute Rehab OT Goals Patient Stated Goal: return home OT Goal Formulation: With patient Time For Goal Achievement: 03/25/21 Potential to Achieve Goals: Good  OT Frequency: Min 2X/week   Barriers to D/C:            Co-evaluation              AM-PAC OT "6 Clicks" Daily Activity     Outcome Measure Help from another person eating meals?: None Help from another person taking care of personal grooming?: A Little Help from another person toileting, which includes using toliet, bedpan, or urinal?: A Little Help from another person bathing (including washing, rinsing, drying)?: A Little Help from another person to put on and taking off regular upper body clothing?: A Little Help from another person to put on and taking off regular lower body clothing?: A Little 6 Click Score: 19   End of Session    Activity Tolerance: Patient tolerated treatment well Patient left: in bed;with call bell/phone within reach  OT Visit Diagnosis: Unsteadiness on feet (R26.81);Other abnormalities of gait and mobility (R26.89);Muscle weakness (generalized) (M62.81)                Time: 4098-1191 OT Time Calculation (min): 21 min Charges:  OT General Charges $OT Visit: 1 Visit OT Evaluation $OT Eval Low Complexity: 1  Low  Bradd Canary, OTR/L Acute Rehab Services Office: [phone]   Lorre Munroe 03/11/2021, 9:38 AM

## 2021-03-11 NOTE — Progress Notes (Addendum)
[facility]                                                                              Patient Demographics  Thomas West, is a 60 y.o. male, DOB - May 20, 1961, QIW:[id number]  Admit date - 03/08/2021   Admitting Physician Almon Hercules, MD  Outpatient Primary MD for the patient is [name], [name] Expose, MD  Outpatient specialists:   LOS - 2  days   Medical records reviewed and are as summarized below:    Chief Complaint  Patient presents with   Shortness of Breath       Brief summary   60 year old male with history of systolic CHF, CAD, DM-2, HTN, CVA and CKD-3A presenting with progressive edema, orthopnea and dyspnea on exertion for about 2 weeks.  Patient moved from [place] to [place] about a year ago.  He has not established care with PCP, and has not been able to fill his medications.  He is not sure about taking diuretics.  He also admits to taking ibuprofen twice daily.  Hypertensive to 180/120s.  BNP elevated to 3600.  Admitted for acute on chronic systolic CHF with marked anasarca, and started on IV Lasix.    Assessment & Plan    Principal Problem:   Acute on chronic combined systolic and diastolic CHF -Patient presented with progressive edema, orthopnea, dyspnea on exertion, volume overload, elevated BNP -Continue IV Lasix 60 mg twice daily for diuresis, diuresing well.  Negative balance of 9.8 L -2D echo showed EF of 20 to 25% with severely decreased LV function, global hypokinesis, grade 2DD -Plan for cardiac cath today  Active Problems: Hypertensive urgency -BP now improving, continue Coreg, BiDil, Entresto, Lasix   History of MI (myocardial infarction), CAD -Has been noncompliant, counseled -Plan for cardiac cath today, continue home aspirin and Plavix   Type 2 diabetes mellitus with diabetic neuropathy, unspecified (HCC) -Uncontrolled, hemoglobin A1c> 14.0 -Continue Jardiance - cont SSI, basal insulin 15units daily     Code  Status: Full  DVT Prophylaxis:  Place and maintain sequential compression device Start: 03/09/21 0903 enoxaparin (LOVENOX) injection 40 mg Start: 03/08/21 1000   Level of Care: Level of care: Telemetry Cardiac Family Communication: Discussed all imaging results, lab results, explained to the patient    Disposition Plan:     Status is: Inpatient  Remains inpatient appropriate because: plan for cardiac cath    Time Spent in minutes   35 mins   Procedures:    Consultants:   Cardiology   Antimicrobials:   Anti-infectives (From admission, onward)    None          Medications  Scheduled Meds:  [START ON 03/13/2021] aspirin EC  81 mg Oral Daily   atorvastatin  80 mg Oral Daily   carvedilol  25 mg Oral BID   clopidogrel  75 mg Oral Daily   docusate sodium  100 mg Oral Q12H   empagliflozin  10 mg Oral QAC breakfast   enoxaparin (LOVENOX) injection  40 mg Subcutaneous Q24H   furosemide  60 mg Intravenous BID   gabapentin  100 mg Oral TID  hydrALAZINE  50 mg Oral TID   insulin aspart  0-15 Units Subcutaneous TID WC   insulin glargine-yfgn  15 Units Subcutaneous Daily   isosorbide mononitrate  60 mg Oral Daily   polyethylene glycol  17 g Oral Daily   sacubitril-valsartan  1 tablet Oral BID   sodium chloride flush  3 mL Intravenous Q12H   Continuous Infusions:  sodium chloride     PRN Meds:.sodium chloride, acetaminophen, ondansetron (ZOFRAN) IV, sodium chloride flush      Subjective:   Theoden Mauch was seen and examined today. Feels better now, shortness of breath improving.  Patient denies dizziness, abdominal pain, N/V/D/C, new weakness, numbess, tingling. No acute events overnight.    Objective:   Vitals:   03/11/21 1130 03/11/21 1132 03/11/21 1146 03/11/21 1202  BP: 106/72 106/72 99/76 93/67   Pulse: 67 68 68 69  Resp: 18     Temp: 98.1 F (36.7 C)     TempSrc: Skin     SpO2: 97% 90% 96% 90%  Weight:      Height:        Intake/Output Summary  (Last 24 hours) at 03/11/2021 1428 Last data filed at 03/11/2021 1348 Gross per 24 hour  Intake 47.1 ml  Output 2880 ml  Net -2832.9 ml     Wt Readings from Last 3 Encounters:  03/11/21 75.4 kg  02/03/21 73 kg  03/27/19 79.7 kg     Exam General: Alert and oriented x 3, NAD Cardiovascular: S1 S2 auscultated, no murmurs, RRR Respiratory: bibasilar rales  Gastrointestinal: Soft, nontender, nondistended, + bowel sounds Ext: no pedal edema bilaterally Neuro: no new FND's  Psych: Normal affect and demeanor, alert and oriented x3    Data Reviewed:  I have personally reviewed following labs and imaging studies  Micro Results Recent Results (from the past 240 hour(s))  Resp Panel by RT-PCR (Flu A&B, Covid) Nasopharyngeal Swab     Status: None   Collection Time: 03/08/21  6:48 AM   Specimen: Nasopharyngeal Swab; Nasopharyngeal(NP) swabs in vial transport medium  Result Value Ref Range Status   SARS Coronavirus 2 by RT PCR NEGATIVE NEGATIVE Final    Comment: (NOTE) SARS-CoV-2 target nucleic acids are NOT DETECTED.  The SARS-CoV-2 RNA is generally detectable in upper respiratory specimens during the acute phase of infection. The lowest concentration of SARS-CoV-2 viral copies this assay can detect is 138 copies/mL. A negative result does not preclude SARS-Cov-2 infection and should not be used as the sole basis for treatment or other patient management decisions. A negative result may occur with  improper specimen collection/handling, submission of specimen other than nasopharyngeal swab, presence of viral mutation(s) within the areas targeted by this assay, and inadequate number of viral copies(<138 copies/mL). A negative result must be combined with clinical observations, patient history, and epidemiological information. The expected result is Negative.  Fact Sheet for Patients:  BloggerCourse.com  Fact Sheet for Healthcare Providers:   SeriousBroker.it  This test is no t yet approved or cleared by the [place] FDA and  has been authorized for detection and/or diagnosis of SARS-CoV-2 by FDA under an Emergency Use Authorization (EUA). This EUA will remain  in effect (meaning this test can be used) for the duration of the COVID-19 declaration under Section 564(b)(1) of the Act, 21 U.S.C.section 360bbb-3(b)(1), unless the authorization is terminated  or revoked sooner.       Influenza A by PCR NEGATIVE NEGATIVE Final   Influenza B by PCR NEGATIVE NEGATIVE Final  Comment: (NOTE) The Xpert Xpress SARS-CoV-2/FLU/RSV plus assay is intended as an aid in the diagnosis of influenza from Nasopharyngeal swab specimens and should not be used as a sole basis for treatment. Nasal washings and aspirates are unacceptable for Xpert Xpress SARS-CoV-2/FLU/RSV testing.  Fact Sheet for Patients: BloggerCourse.com  Fact Sheet for Healthcare Providers: SeriousBroker.it  This test is not yet approved or cleared by the [place] FDA and has been authorized for detection and/or diagnosis of SARS-CoV-2 by FDA under an Emergency Use Authorization (EUA). This EUA will remain in effect (meaning this test can be used) for the duration of the COVID-19 declaration under Section 564(b)(1) of the Act, 21 U.S.C. section 360bbb-3(b)(1), unless the authorization is terminated or revoked.  Performed at [facility], [address] [place], [place] [address]     Radiology Reports DG Chest 2 View  Result Date: 03/08/2021 CLINICAL DATA:  Shortness of breath EXAM: CHEST - 2 VIEW COMPARISON:  02/06/2018 FINDINGS: Cardiac shadow is enlarged but stable. Aortic calcifications are seen. Small pleural effusions are noted bilaterally. Focal airspace opacity is noted in the medial right lung base projecting in the right lower lobe. No bony abnormality is noted.  IMPRESSION: Right lower lobe infiltrate with small pleural effusions bilaterally. Electronically Signed   By: Alcide Clever M.D.   On: 03/08/2021 03:31   CARDIAC CATHETERIZATION  Result Date: 03/11/2021   Dist RCA lesion is 20% stenosed.   Prox LAD lesion is 30% stenosed.   Dist LAD lesion is 70% stenosed.   2nd Mrg lesion is 60% stenosed.   Previously placed RPAV stent (unknown type) is  widely patent.   LV end diastolic pressure is normal.   LV end diastolic pressure is normal.   Hemodynamic findings consistent with mild pulmonary hypertension. 1.  Mild obstructive coronary artery disease with patent RPLV stent. 2.  Normal cardiac output and index with mean right atrial pressure of 5 mmHg and mean wedge pressure of 5 mmHg.   ECHOCARDIOGRAM COMPLETE  Result Date: 03/09/2021    ECHOCARDIOGRAM REPORT   Patient Name:   SIRAJ DERMODY Date of Exam: 03/09/2021 Medical Rec #:  [id number]     Height:       69.0 in Accession #:    [id number]    Weight:       161.0 lb Date of Birth:  05-15-1961     BSA:          1.884 m Patient Age:    59 years      BP:           115/98 mmHg Patient Gender: M             HR:           64 bpm. Exam Location:  Inpatient Procedure: 2D Echo, Cardiac Doppler and Color Doppler Indications:    CHF- Acute systolic  History:        Patient has no prior history of Echocardiogram examinations.                 CHF, CAD and Acute MI, Stroke; Risk Factors:Diabetes and                 Hypertension.  Sonographer:    Gertie Fey MHA, RDMS, RVT, RDCS Referring Phys: [id number] JARED M GARDNER IMPRESSIONS  1. Left ventricular ejection fraction, by estimation, is 20 to 25%. The left ventricle has severely decreased function. The left ventricle demonstrates global hypokinesis. Left ventricular diastolic  parameters are consistent with Grade II diastolic dysfunction (pseudonormalization).  2. Right ventricular systolic function is normal. The right ventricular size is mildly enlarged. There is normal  pulmonary artery systolic pressure. The estimated right ventricular systolic pressure is 24.0 mmHg.  3. Right atrial size was mildly dilated.  4. The mitral valve is normal in structure. Trivial mitral valve regurgitation. No evidence of mitral stenosis.  5. Tricuspid valve regurgitation is moderate.  6. The aortic valve is tricuspid. Aortic valve regurgitation is not visualized. Mild aortic valve sclerosis is present, with no evidence of aortic valve stenosis.  7. The inferior vena cava is normal in size with greater than 50% respiratory variability, suggesting right atrial pressure of 3 mmHg. FINDINGS  Left Ventricle: Left ventricular ejection fraction, by estimation, is 20 to 25%. The left ventricle has severely decreased function. The left ventricle demonstrates global hypokinesis. The left ventricular internal cavity size was normal in size. There is no left ventricular hypertrophy. Left ventricular diastolic parameters are consistent with Grade II diastolic dysfunction (pseudonormalization). Right Ventricle: The right ventricular size is mildly enlarged. No increase in right ventricular wall thickness. Right ventricular systolic function is normal. There is normal pulmonary artery systolic pressure. The tricuspid regurgitant velocity is 2.29  m/s, and with an assumed right atrial pressure of 3 mmHg, the estimated right ventricular systolic pressure is 24.0 mmHg. Left Atrium: Left atrial size was normal in size. Right Atrium: Right atrial size was mildly dilated. Pericardium: There is no evidence of pericardial effusion. Mitral Valve: The mitral valve is normal in structure. There is mild thickening of the mitral valve leaflet(s). There is mild calcification of the mitral valve leaflet(s). Trivial mitral valve regurgitation. No evidence of mitral valve stenosis. Tricuspid Valve: The tricuspid valve is normal in structure. Tricuspid valve regurgitation is moderate . No evidence of tricuspid stenosis. Aortic  Valve: The aortic valve is tricuspid. Aortic valve regurgitation is not visualized. Mild aortic valve sclerosis is present, with no evidence of aortic valve stenosis. Aortic valve mean gradient measures 2.5 mmHg. Aortic valve peak gradient measures 4.8 mmHg. Aortic valve area, by VTI measures 1.58 cm. Pulmonic Valve: The pulmonic valve was normal in structure. Pulmonic valve regurgitation is not visualized. No evidence of pulmonic stenosis. Aorta: The aortic root is normal in size and structure. Venous: The inferior vena cava is normal in size with greater than 50% respiratory variability, suggesting right atrial pressure of 3 mmHg. IAS/Shunts: No atrial level shunt detected by color flow Doppler.  LEFT VENTRICLE PLAX 2D LVIDd:         4.90 cm      Diastology LVIDs:         4.50 cm      LV e' medial:    4.19 cm/s LV PW:         1.00 cm      LV E/e' medial:  11.3 LV IVS:        1.00 cm      LV e' lateral:   6.53 cm/s LVOT diam:     2.00 cm      LV E/e' lateral: 7.3 LV SV:         36 LV SV Index:   19 LVOT Area:     3.14 cm  LV Volumes (MOD) LV vol d, MOD A2C: 124.0 ml LV vol d, MOD A4C: 99.9 ml LV vol s, MOD A2C: 92.8 ml LV vol s, MOD A4C: 79.9 ml LV SV MOD A2C:  31.2 ml LV SV MOD A4C:     99.9 ml LV SV MOD BP:      25.6 ml RIGHT VENTRICLE RV S prime:     5.36 cm/s TAPSE (M-mode): 1.3 cm LEFT ATRIUM             Index        RIGHT ATRIUM           Index LA diam:        3.90 cm 2.07 cm/m   RA Area:     21.30 cm LA Vol (A2C):   51.5 ml 27.33 ml/m  RA Volume:   66.20 ml  35.14 ml/m LA Vol (A4C):   56.1 ml 29.78 ml/m LA Biplane Vol: 56.7 ml 30.09 ml/m  AORTIC VALVE AV Area (Vmax):    1.97 cm AV Area (Vmean):   1.83 cm AV Area (VTI):     1.58 cm AV Vmax:           109.00 cm/s AV Vmean:          72.500 cm/s AV VTI:            0.227 m AV Peak Grad:      4.8 mmHg AV Mean Grad:      2.5 mmHg LVOT Vmax:         68.40 cm/s LVOT Vmean:        42.200 cm/s LVOT VTI:          0.114 m LVOT/AV VTI ratio: 0.50  AORTA Ao  Root diam: 3.00 cm MITRAL VALVE               TRICUSPID VALVE MV Area (PHT): 4.71 cm    TR Peak grad:   21.0 mmHg MV Decel Time: 161 msec    TR Vmax:        229.00 cm/s MV E velocity: 47.50 cm/s MV A velocity: 39.55 cm/s  SHUNTS MV E/A ratio:  1.20        Systemic VTI:  0.11 m                            Systemic Diam: 2.00 cm Donato Schultz MD Electronically signed by Donato Schultz MD Signature Date/Time: 03/09/2021/1:53:07 PM    Final     Lab Data:  CBC: Recent Labs  Lab 03/08/21 0258 03/08/21 0316 03/09/21 0303 03/10/21 0351 03/11/21 0406  WBC 7.1  --  5.8 6.7 7.2  NEUTROABS 4.1  --   --   --   --   HGB 15.2 17.0 12.7* 13.2 12.7*  HCT 48.4 50.0 39.1 39.8 38.7*  MCV 88.2  --  86.7 84.0 84.7  PLT 310  --  239 267 267   Basic Metabolic Panel: Recent Labs  Lab 03/08/21 0258 03/08/21 0316 03/08/21 0527 03/09/21 0303 03/10/21 0351 03/11/21 0406  NA 137 140  --  139 144 143  K 4.5 4.4  --  4.4 4.0 3.8  CL 100  --   --  101 102 99  CO2 26  --   --  31 34* 33*  GLUCOSE 298*  --   --  347* 169* 100*  BUN 20  --   --  19 21* 19  CREATININE 1.43*  --   --  1.48* 1.56* 1.65*  CALCIUM 8.6*  --   --  8.1* 8.3* 8.1*  MG  --   --  2.0 1.7 1.9 1.8  PHOS  --   --   --  4.4 4.2  --    GFR: Estimated Creatinine Clearance: 49.4 mL/min (A) (by C-G formula based on SCr of 1.65 mg/dL (H)). Liver Function Tests: Recent Labs  Lab 03/09/21 0303 03/10/21 0351  ALBUMIN 2.1* 2.1*   No results for input(s): LIPASE, AMYLASE in the last 168 hours. No results for input(s): AMMONIA in the last 168 hours. Coagulation Profile: No results for input(s): INR, PROTIME in the last 168 hours. Cardiac Enzymes: No results for input(s): CKTOTAL, CKMB, CKMBINDEX, TROPONINI in the last 168 hours. BNP (last 3 results) Recent Labs    02/03/21 0911  PROBNP 3,012.0*   HbA1C: No results for input(s): HGBA1C in the last 72 hours. CBG: Recent Labs  Lab 03/10/21 1550 03/10/21 1558 03/10/21 1818  03/10/21 2117 03/11/21 0608  GLUCAP 254* 260* 182* 93 101*   Lipid Profile: No results for input(s): CHOL, HDL, LDLCALC, TRIG, CHOLHDL, LDLDIRECT in the last 72 hours. Thyroid Function Tests: No results for input(s): TSH, T4TOTAL, FREET4, T3FREE, THYROIDAB in the last 72 hours. Anemia Panel: No results for input(s): VITAMINB12, FOLATE, FERRITIN, TIBC, IRON, RETICCTPCT in the last 72 hours. Urine analysis:    Component Value Date/Time   COLORURINE YELLOW 03/08/2021 0246   APPEARANCEUR HAZY (A) 03/08/2021 0246   LABSPEC 1.019 03/08/2021 0246   PHURINE 5.0 03/08/2021 0246   GLUCOSEU 150 (A) 03/08/2021 0246   HGBUR NEGATIVE 03/08/2021 0246   BILIRUBINUR NEGATIVE 03/08/2021 0246   BILIRUBINUR negative 01/02/2021 1312   KETONESUR NEGATIVE 03/08/2021 0246   PROTEINUR >=300 (A) 03/08/2021 0246   UROBILINOGEN 0.2 01/02/2021 1312   NITRITE NEGATIVE 03/08/2021 0246   LEUKOCYTESUR NEGATIVE 03/08/2021 0246     Nolan Tuazon M.D. [facility] 03/11/2021, 2:28 PM  Available via Epic secure chat 7am-7pm After 7 pm, please refer to night coverage provider listed on amion.

## 2021-03-11 NOTE — Progress Notes (Signed)
Progress Note  Patient Name: Thomas West Date of Encounter: 03/11/2021  [facility] Cardiologist: Charlton Haws, MD new  Subjective   Breathing much better than on admission.  Says that he has good family support down here and will be able to get in take his medications at discharge.  He does not have scales.  Inpatient Medications    Scheduled Meds:  [MAR Hold] aspirin EC  81 mg Oral Daily   [MAR Hold] atorvastatin  80 mg Oral Daily   [MAR Hold] carvedilol  25 mg Oral BID   [MAR Hold] clopidogrel  75 mg Oral Daily   [MAR Hold] docusate sodium  100 mg Oral Q12H   [MAR Hold] empagliflozin  10 mg Oral QAC breakfast   [MAR Hold] enoxaparin (LOVENOX) injection  40 mg Subcutaneous Q24H   [MAR Hold] furosemide  60 mg Intravenous BID   [MAR Hold] gabapentin  100 mg Oral TID   [MAR Hold] hydrALAZINE  50 mg Oral TID   [MAR Hold] insulin aspart  0-15 Units Subcutaneous TID WC   [MAR Hold] insulin glargine-yfgn  15 Units Subcutaneous Daily   [MAR Hold] isosorbide mononitrate  60 mg Oral Daily   [MAR Hold] polyethylene glycol  17 g Oral Daily   [MAR Hold] sacubitril-valsartan  1 tablet Oral BID   [MAR Hold] sodium chloride flush  3 mL Intravenous Q12H   sodium chloride flush  3 mL Intravenous Q12H   Continuous Infusions:  [MAR Hold] sodium chloride     sodium chloride     sodium chloride 50 mL/hr at 03/11/21 0658   PRN Meds: [MAR Hold] sodium chloride, sodium chloride, [MAR Hold] acetaminophen, fentaNYL, Heparin (Porcine) in NaCl, heparin sodium (porcine), lidocaine (PF), midazolam, [MAR Hold] ondansetron (ZOFRAN) IV, Radial Cocktail/Verapamil only, [MAR Hold] sodium chloride flush, sodium chloride flush   Vital Signs    Vitals:   03/10/21 2100 03/11/21 0528 03/11/21 0859 03/11/21 0938  BP: 106/66 100/76 (!) 122/99   Pulse: 74 74    Resp:  18    Temp:  97.9 F (36.6 C)    TempSrc:  Oral    SpO2:  96%  96%  Weight:  75.4 kg    Height:        Intake/Output Summary  (Last 24 hours) at 03/11/2021 1005 Last data filed at 03/11/2021 0658 Gross per 24 hour  Intake 287.1 ml  Output 3330 ml  Net -3042.9 ml   Last 3 Weights 03/11/2021 03/10/2021 03/09/2021  Weight (lbs) 166 lb 3.2 oz 170 lb 12.8 oz 172 lb 9.9 oz  Weight (kg) 75.388 kg 77.474 kg 78.3 kg      Telemetry    Sinus rhythm- Personally Reviewed  ECG    None today- Personally Reviewed  Physical Exam   GEN: No acute distress at rest, but is short of breath with minimal exertion on O2 Neck: JVD 10 cm Cardiac: RRR, soft murmurs, rubs, or gallops.  Respiratory: Rales bases bilaterally. GI: Soft, nontender, non-distended  MS: 1+ edema; No deformity. Neuro:  Nonfocal  Psych: Normal affect   Labs    High Sensitivity Troponin:   Recent Labs  Lab 03/08/21 0258 03/08/21 0527  TROPONINIHS 20* 22*     Chemistry Recent Labs  Lab 03/09/21 0303 03/10/21 0351 03/11/21 0406  NA 139 144 143  K 4.4 4.0 3.8  CL 101 102 99  CO2 31 34* 33*  GLUCOSE 347* 169* 100*  BUN 19 21* 19  CREATININE 1.48* 1.56* 1.65*  CALCIUM 8.1* 8.3* 8.1*  MG 1.7 1.9 1.8  ALBUMIN 2.1* 2.1*  --   GFRNONAA 55* 51* 48*  ANIONGAP 7 8 11     Lipids No results for input(s): CHOL, TRIG, HDL, LABVLDL, LDLCALC, CHOLHDL in the last 168 hours.  Hematology Recent Labs  Lab 03/09/21 0303 03/10/21 0351 03/11/21 0406  WBC 5.8 6.7 7.2  RBC 4.51 4.74 4.57  HGB 12.7* 13.2 12.7*  HCT 39.1 39.8 38.7*  MCV 86.7 84.0 84.7  MCH 28.2 27.8 27.8  MCHC 32.5 33.2 32.8  RDW 13.8 13.8 14.0  PLT 239 267 267   Thyroid No results for input(s): TSH, FREET4 in the last 168 hours.  BNP Recent Labs  Lab 03/08/21 0258 03/09/21 0304  BNP 3,620.7* 2,895.4*    DDimer No results for input(s): DDIMER in the last 168 hours.  Lab Results  Component Value Date   HGBA1C >14.0 (H) 02/03/2021    Radiology    ECHOCARDIOGRAM COMPLETE  Result Date: 03/09/2021    ECHOCARDIOGRAM REPORT   Patient Name:   Thomas West Date of Exam:  03/09/2021 Medical Rec #:  03/11/2021     Height:       69.0 in Accession #:    [id number]    Weight:       161.0 lb Date of Birth:  1960-07-26     BSA:          1.884 m Patient Age:    59 years      BP:           115/98 mmHg Patient Gender: M             HR:           64 bpm. Exam Location:  Inpatient Procedure: 2D Echo, Cardiac Doppler and Color Doppler Indications:    CHF- Acute systolic  History:        Patient has no prior history of Echocardiogram examinations.                 CHF, CAD and Acute MI, Stroke; Risk Factors:Diabetes and                 Hypertension.  Sonographer:    13/01/1963 MHA, RDMS, RVT, RDCS Referring Phys: [id number] JARED M GARDNER IMPRESSIONS  1. Left ventricular ejection fraction, by estimation, is 20 to 25%. The left ventricle has severely decreased function. The left ventricle demonstrates global hypokinesis. Left ventricular diastolic parameters are consistent with Grade II diastolic dysfunction (pseudonormalization).  2. Right ventricular systolic function is normal. The right ventricular size is mildly enlarged. There is normal pulmonary artery systolic pressure. The estimated right ventricular systolic pressure is 24.0 mmHg.  3. Right atrial size was mildly dilated.  4. The mitral valve is normal in structure. Trivial mitral valve regurgitation. No evidence of mitral stenosis.  5. Tricuspid valve regurgitation is moderate.  6. The aortic valve is tricuspid. Aortic valve regurgitation is not visualized. Mild aortic valve sclerosis is present, with no evidence of aortic valve stenosis.  7. The inferior vena cava is normal in size with greater than 50% respiratory variability, suggesting right atrial pressure of 3 mmHg. FINDINGS  Left Ventricle: Left ventricular ejection fraction, by estimation, is 20 to 25%. The left ventricle has severely decreased function. The left ventricle demonstrates global hypokinesis. The left ventricular internal cavity size was normal in size. There is no  left ventricular hypertrophy. Left ventricular diastolic parameters are consistent with Grade II diastolic dysfunction (pseudonormalization).  Right Ventricle: The right ventricular size is mildly enlarged. No increase in right ventricular wall thickness. Right ventricular systolic function is normal. There is normal pulmonary artery systolic pressure. The tricuspid regurgitant velocity is 2.29  m/s, and with an assumed right atrial pressure of 3 mmHg, the estimated right ventricular systolic pressure is 24.0 mmHg. Left Atrium: Left atrial size was normal in size. Right Atrium: Right atrial size was mildly dilated. Pericardium: There is no evidence of pericardial effusion. Mitral Valve: The mitral valve is normal in structure. There is mild thickening of the mitral valve leaflet(s). There is mild calcification of the mitral valve leaflet(s). Trivial mitral valve regurgitation. No evidence of mitral valve stenosis. Tricuspid Valve: The tricuspid valve is normal in structure. Tricuspid valve regurgitation is moderate . No evidence of tricuspid stenosis. Aortic Valve: The aortic valve is tricuspid. Aortic valve regurgitation is not visualized. Mild aortic valve sclerosis is present, with no evidence of aortic valve stenosis. Aortic valve mean gradient measures 2.5 mmHg. Aortic valve peak gradient measures 4.8 mmHg. Aortic valve area, by VTI measures 1.58 cm. Pulmonic Valve: The pulmonic valve was normal in structure. Pulmonic valve regurgitation is not visualized. No evidence of pulmonic stenosis. Aorta: The aortic root is normal in size and structure. Venous: The inferior vena cava is normal in size with greater than 50% respiratory variability, suggesting right atrial pressure of 3 mmHg. IAS/Shunts: No atrial level shunt detected by color flow Doppler.  LEFT VENTRICLE PLAX 2D LVIDd:         4.90 cm      Diastology LVIDs:         4.50 cm      LV e' medial:    4.19 cm/s LV PW:         1.00 cm      LV E/e' medial:  11.3  LV IVS:        1.00 cm      LV e' lateral:   6.53 cm/s LVOT diam:     2.00 cm      LV E/e' lateral: 7.3 LV SV:         36 LV SV Index:   19 LVOT Area:     3.14 cm  LV Volumes (MOD) LV vol d, MOD A2C: 124.0 ml LV vol d, MOD A4C: 99.9 ml LV vol s, MOD A2C: 92.8 ml LV vol s, MOD A4C: 79.9 ml LV SV MOD A2C:     31.2 ml LV SV MOD A4C:     99.9 ml LV SV MOD BP:      25.6 ml RIGHT VENTRICLE RV S prime:     5.36 cm/s TAPSE (M-mode): 1.3 cm LEFT ATRIUM             Index        RIGHT ATRIUM           Index LA diam:        3.90 cm 2.07 cm/m   RA Area:     21.30 cm LA Vol (A2C):   51.5 ml 27.33 ml/m  RA Volume:   66.20 ml  35.14 ml/m LA Vol (A4C):   56.1 ml 29.78 ml/m LA Biplane Vol: 56.7 ml 30.09 ml/m  AORTIC VALVE AV Area (Vmax):    1.97 cm AV Area (Vmean):   1.83 cm AV Area (VTI):     1.58 cm AV Vmax:           109.00 cm/s AV Vmean:  72.500 cm/s AV VTI:            0.227 m AV Peak Grad:      4.8 mmHg AV Mean Grad:      2.5 mmHg LVOT Vmax:         68.40 cm/s LVOT Vmean:        42.200 cm/s LVOT VTI:          0.114 m LVOT/AV VTI ratio: 0.50  AORTA Ao Root diam: 3.00 cm MITRAL VALVE               TRICUSPID VALVE MV Area (PHT): 4.71 cm    TR Peak grad:   21.0 mmHg MV Decel Time: 161 msec    TR Vmax:        229.00 cm/s MV E velocity: 47.50 cm/s MV A velocity: 39.55 cm/s  SHUNTS MV E/A ratio:  1.20        Systemic VTI:  0.11 m                            Systemic Diam: 2.00 cm Donato Schultz MD Electronically signed by Donato Schultz MD Signature Date/Time: 03/09/2021/1:53:07 PM    Final     Cardiac Studies   ECHO: 03/09/2021 1. Left ventricular ejection fraction, by estimation, is 20 to 25%. The  left ventricle has severely decreased function. The left ventricle  demonstrates global hypokinesis. Left ventricular diastolic parameters are consistent with Grade II diastolic dysfunction (pseudonormalization).   2. Right ventricular systolic function is normal. The right ventricular  size is mildly enlarged. There  is normal pulmonary artery systolic  pressure. The estimated right ventricular systolic pressure is 24.0 mmHg.   3. Right atrial size was mildly dilated.   4. The mitral valve is normal in structure. Trivial mitral valve  regurgitation. No evidence of mitral stenosis.   5. Tricuspid valve regurgitation is moderate.   6. The aortic valve is tricuspid. Aortic valve regurgitation is not  visualized. Mild aortic valve sclerosis is present, with no evidence of aortic valve stenosis.   7. The inferior vena cava is normal in size with greater than 50%  respiratory variability, suggesting right atrial pressure of 3 mmHg.   Patient Profile     61 y.o. male with a history of HFrEF, CAD, DM2, HTN, HLD, CKD II-III, was admitted 10/17 with CHF. Indicates "stents" in [place] 2 years ago No medical f/u since moving to [place] a year ago   Assessment & Plan    ACute on chronic combined systolic and diastolic CHF -Continue diuresis intake/output net -8 L's - he is scheduled for right and left cath today  - History of stents in [place] no details ? Ischemic DCM - TTE EF 20-25% no significant valve disease Moderate TR  - He is currently on Lasix 60 mg IV twice daily, carvedilol 25 mg twice daily, hydralazine 50 mg 3 times daily, Imdur 60 mg daily, Entresto 24-26 mg daily - At this time, SBP 90s-100s but he is tolerating it well.  Continue current therapy - Otherwise, per IM    For questions or updates, please contact [facility] Please consult [email or web address] for contact info under        Signed, Charlton Haws, MD  03/11/2021, 10:05 AM

## 2021-03-12 ENCOUNTER — Inpatient Hospital Stay (HOSPITAL_COMMUNITY): Payer: Medicare Other

## 2021-03-12 DIAGNOSIS — E1140 Type 2 diabetes mellitus with diabetic neuropathy, unspecified: Secondary | ICD-10-CM

## 2021-03-12 DIAGNOSIS — R531 Weakness: Secondary | ICD-10-CM

## 2021-03-12 DIAGNOSIS — I509 Heart failure, unspecified: Secondary | ICD-10-CM | POA: Diagnosis not present

## 2021-03-12 DIAGNOSIS — I252 Old myocardial infarction: Secondary | ICD-10-CM

## 2021-03-12 DIAGNOSIS — I5023 Acute on chronic systolic (congestive) heart failure: Secondary | ICD-10-CM | POA: Diagnosis not present

## 2021-03-12 DIAGNOSIS — N182 Chronic kidney disease, stage 2 (mild): Secondary | ICD-10-CM | POA: Diagnosis not present

## 2021-03-12 LAB — BLOOD GAS, ARTERIAL
Acid-Base Excess: 8.3 mmol/L — ABNORMAL HIGH (ref 0.0–2.0)
Bicarbonate: 32.5 mmol/L — ABNORMAL HIGH (ref 20.0–28.0)
Drawn by: 331761
FIO2: 21
O2 Saturation: 93.9 %
Patient temperature: 37.1
pCO2 arterial: 46.4 mmHg (ref 32.0–48.0)
pH, Arterial: 7.459 — ABNORMAL HIGH (ref 7.350–7.450)
pO2, Arterial: 71.2 mmHg — ABNORMAL LOW (ref 83.0–108.0)

## 2021-03-12 LAB — GLUCOSE, CAPILLARY
Glucose-Capillary: 130 mg/dL — ABNORMAL HIGH (ref 70–99)
Glucose-Capillary: 259 mg/dL — ABNORMAL HIGH (ref 70–99)
Glucose-Capillary: 207 mg/dL — ABNORMAL HIGH (ref 70–99)
Glucose-Capillary: 184 mg/dL — ABNORMAL HIGH (ref 70–99)

## 2021-03-12 LAB — BASIC METABOLIC PANEL
Anion gap: 9 (ref 5–15)
BUN: 24 mg/dL — ABNORMAL HIGH (ref 6–20)
CO2: 32 mmol/L (ref 22–32)
Calcium: 8.2 mg/dL — ABNORMAL LOW (ref 8.9–10.3)
Chloride: 101 mmol/L (ref 98–111)
Creatinine, Ser: 1.88 mg/dL — ABNORMAL HIGH (ref 0.61–1.24)
GFR, Estimated: 41 mL/min — ABNORMAL LOW (ref >60)
Glucose, Bld: 117 mg/dL — ABNORMAL HIGH (ref 70–99)
Potassium: 4.2 mmol/L (ref 3.5–5.1)
Sodium: 142 mmol/L (ref 135–145)

## 2021-03-12 LAB — AMMONIA: Ammonia: 31 umol/L (ref 9–35)

## 2021-03-12 LAB — VITAMIN B12: Vitamin B-12: 558 pg/mL (ref 180–914)

## 2021-03-12 LAB — FOLATE: Folate: 11.4 ng/mL (ref >5.9)

## 2021-03-12 MED ORDER — FUROSEMIDE 40 MG PO TABS
40.0000 mg | ORAL_TABLET | Freq: Every day | ORAL | Status: DC
  Administered 2021-03-13 – 2021-03-17 (×5): 40 mg via ORAL
  Filled 2021-03-12 (×5): qty 1

## 2021-03-12 NOTE — Progress Notes (Addendum)
[facility]                                                                              Patient Demographics  Thomas West, is a 60 y.o. male, DOB - Jan 24, 1961, YHC:[id number]  Admit date - 03/08/2021   Admitting Physician Almon Hercules, MD  Outpatient Primary MD for the patient is Thomas West Expose, MD  Outpatient specialists:   LOS - 3  days   Medical records reviewed and are as summarized below:    Chief Complaint  Patient presents with   Shortness of Breath       Brief summary   60 year old male with history of systolic CHF, CAD, DM-2, HTN, CVA and CKD-3A presenting with progressive edema, orthopnea and dyspnea on exertion for about 2 weeks.  Patient moved from [place] to [place] about a year ago.  He has not established care with PCP, and has not been able to fill his medications.  He is not sure about taking diuretics.  He also admits to taking ibuprofen twice daily.  Hypertensive to 180/120s.  BNP elevated to 3600.  Admitted for acute on chronic systolic CHF with marked anasarca, and started on IV Lasix.    Assessment & Plan    Principal Problem: Acute encephalopathy with dizziness, gait instability, subtle right-sided weakness, ?  Dysarthria -Patient on examination this a.m., was somewhat more alert and able to answer questions, although at baseline appears to have some cognitive deficits  -During PT session patient was noted to have gait instability, right-sided weakness.  While examining him with PT assistance, patient was noted to be wobbly, does not fully follow commands, does not respond to questions or needs repetitive prompting -Appears to have a prior history of CVA, high risk given history of CAD, diabetes mellitus, hypertension, CKD -Obtain stat code stroke CT head, serial neurochecks, currently on aspirin, Plavix and statin  -Creatinine trending up, will hold Entresto today, transition to oral Lasix this a.m. -Obtain ABG, ammonia  level, B1, B12, folate.  Neurontin discontinued.       Acute on chronic combined systolic and diastolic CHF -Patient presented with progressive edema, orthopnea, dyspnea on exertion, volume overload, elevated BNP -Patient was placed on IV Lasix 60 mg twice daily, negative balance of 12 L -2D echo showed EF of 20 to 25% with severely decreased LV function, global hypokinesis, grade 2DD -Underwent cardiac cath on 10/19, mild obstructive CAD with patent RPLV stent -Creatinine trending up today, transitioned to oral Lasix daily.  Holding Entresto today   Hypertensive urgency -Continue Coreg, BiDil, IV Lasix discontinued.  BP stable -Hold Entresto today due to creatinine trending up   History of MI (myocardial infarction), CAD -Has been noncompliant,  -Continue aspirin, Plavix, statin   Type 2 diabetes mellitus with diabetic neuropathy, unspecified (HCC) -Uncontrolled, hemoglobin A1c> 14.0 -Continue Jardiance -For now continue sliding scale insulin, basal insulin.   -Discussed with patient this a.m. regarding insulin versus oral hypoglycemics he preferred to be on oral hypoglycemics, states he does not have any family support, lives alone  AKI on CKD stage IIIa -Baseline creatinine 1.4-1.5 -Creatinine trending up, likely  due to diuresis.  IV Lasix discontinued.  Transition to oral Lasix. -Hold Entresto    Code Status: Full  DVT Prophylaxis:  Place TED hose Start: 03/12/21 1113 Place and maintain sequential compression device Start: 03/09/21 0903 enoxaparin (LOVENOX) injection 40 mg Start: 03/08/21 1000   Level of Care: Level of care: Telemetry Cardiac Family Communication: Discussed all imaging results, lab results, explained to the patient    Disposition Plan:     Status is: Inpatient  Remains inpatient appropriate because: ?  Code stroke, creatinine trending up   Time Spent in minutes   Procedures:  2D echo  Consultants:   Cardiology   Antimicrobials:    Anti-infectives (From admission, onward)    None          Medications  Scheduled Meds:  [START ON 03/13/2021] aspirin EC  81 mg Oral Daily   atorvastatin  80 mg Oral Daily   carvedilol  25 mg Oral BID   clopidogrel  75 mg Oral Daily   docusate sodium  100 mg Oral Q12H   empagliflozin  10 mg Oral QAC breakfast   enoxaparin (LOVENOX) injection  40 mg Subcutaneous Q24H   [START ON 03/13/2021] furosemide  40 mg Oral Daily   hydrALAZINE  50 mg Oral TID   insulin aspart  0-15 Units Subcutaneous TID WC   insulin glargine-yfgn  15 Units Subcutaneous Daily   isosorbide mononitrate  60 mg Oral Daily   polyethylene glycol  17 g Oral Daily   sodium chloride flush  3 mL Intravenous Q12H   Continuous Infusions:  sodium chloride     PRN Meds:.sodium chloride, acetaminophen, acetaminophen, ondansetron (ZOFRAN) IV, ondansetron (ZOFRAN) IV, sodium chloride flush      Subjective:   Shota Kohrs was seen and examined twice today.  In a.m., mental status was better.  Was notified by PT that patient had gait instability, wobbly, right-sided weakness.  Reexamined again, mental status also more lethargic and different from a.m.   Objective:   Vitals:   03/12/21 0136 03/12/21 0425 03/12/21 0841 03/12/21 1107  BP:  127/80 120/74 125/82  Pulse:  75 74 78  Resp:  (!) 22 20 20   Temp:  98 F (36.7 C) 98.8 F (37.1 C) 98.9 F (37.2 C)  TempSrc:  Oral Oral Oral  SpO2:  96%  94%  Weight: 71.5 kg     Height:        Intake/Output Summary (Last 24 hours) at 03/12/2021 1444 Last data filed at 03/12/2021 1320 Gross per 24 hour  Intake 777 ml  Output 3000 ml  Net -2223 ml     Wt Readings from Last 3 Encounters:  03/12/21 71.5 kg  02/03/21 73 kg  03/27/19 79.7 kg   Physical Exam General: Alert and awake, appears to be somewhat confused, slow to respond, appears to have also some baseline cognitive issues.  In a.m. was fairly oriented, closer to his baseline Cardiovascular: S1  S2 clear, RRR. No pedal edema b/l Respiratory: CTAB, no wheezing Gastrointestinal: Soft, nontender, nondistended, NBS Ext: no pedal edema bilaterally Neuro: gait instability, wobbly, subtle right-sided weakness 4-5/5, LUE, LLE 5/5 Psych: flat affect   Data Reviewed:  I have personally reviewed following labs and imaging studies  Micro Results Recent Results (from the past 240 hour(s))  Resp Panel by RT-PCR (Flu A&B, Covid) Nasopharyngeal Swab     Status: None   Collection Time: 03/08/21  6:48 AM   Specimen: Nasopharyngeal Swab; Nasopharyngeal(NP) swabs in vial transport  medium  Result Value Ref Range Status   SARS Coronavirus 2 by RT PCR NEGATIVE NEGATIVE Final    Comment: (NOTE) SARS-CoV-2 target nucleic acids are NOT DETECTED.  The SARS-CoV-2 RNA is generally detectable in upper respiratory specimens during the acute phase of infection. The lowest concentration of SARS-CoV-2 viral copies this assay can detect is 138 copies/mL. A negative result does not preclude SARS-Cov-2 infection and should not be used as the sole basis for treatment or other patient management decisions. A negative result may occur with  improper specimen collection/handling, submission of specimen other than nasopharyngeal swab, presence of viral mutation(s) within the areas targeted by this assay, and inadequate number of viral copies(<138 copies/mL). A negative result must be combined with clinical observations, patient history, and epidemiological information. The expected result is Negative.  Fact Sheet for Patients:  BloggerCourse.com  Fact Sheet for Healthcare Providers:  SeriousBroker.it  This test is no t yet approved or cleared by the [place] FDA and  has been authorized for detection and/or diagnosis of SARS-CoV-2 by FDA under an Emergency Use Authorization (EUA). This EUA will remain  in effect (meaning this test can be used) for the  duration of the COVID-19 declaration under Section 564(b)(1) of the Act, 21 U.S.C.section 360bbb-3(b)(1), unless the authorization is terminated  or revoked sooner.       Influenza A by PCR NEGATIVE NEGATIVE Final   Influenza B by PCR NEGATIVE NEGATIVE Final    Comment: (NOTE) The Xpert Xpress SARS-CoV-2/FLU/RSV plus assay is intended as an aid in the diagnosis of influenza from Nasopharyngeal swab specimens and should not be used as a sole basis for treatment. Nasal washings and aspirates are unacceptable for Xpert Xpress SARS-CoV-2/FLU/RSV testing.  Fact Sheet for Patients: BloggerCourse.com  Fact Sheet for Healthcare Providers: SeriousBroker.it  This test is not yet approved or cleared by the [place] FDA and has been authorized for detection and/or diagnosis of SARS-CoV-2 by FDA under an Emergency Use Authorization (EUA). This EUA will remain in effect (meaning this test can be used) for the duration of the COVID-19 declaration under Section 564(b)(1) of the Act, 21 U.S.C. section 360bbb-3(b)(1), unless the authorization is terminated or revoked.  Performed at [facility], [address], [place], [place] [address]     Radiology Reports DG Chest 2 View  Result Date: 03/08/2021 CLINICAL DATA:  Shortness of breath EXAM: CHEST - 2 VIEW COMPARISON:  02/06/2018 FINDINGS: Cardiac shadow is enlarged but stable. Aortic calcifications are seen. Small pleural effusions are noted bilaterally. Focal airspace opacity is noted in the medial right lung base projecting in the right lower lobe. No bony abnormality is noted. IMPRESSION: Right lower lobe infiltrate with small pleural effusions bilaterally. Electronically Signed   By: Alcide Clever M.D.   On: 03/08/2021 03:31   CARDIAC CATHETERIZATION  Result Date: 03/11/2021   Dist RCA lesion is 20% stenosed.   Prox LAD lesion is 30% stenosed.   Dist LAD lesion is 70% stenosed.    2nd Mrg lesion is 60% stenosed.   Previously placed RPAV stent (unknown type) is  widely patent.   LV end diastolic pressure is normal.   LV end diastolic pressure is normal.   Hemodynamic findings consistent with mild pulmonary hypertension. 1.  Mild obstructive coronary artery disease with patent RPLV stent. 2.  Normal cardiac output and index with mean right atrial pressure of 5 mmHg and mean wedge pressure of 5 mmHg.   ECHOCARDIOGRAM COMPLETE  Result Date:  03/09/2021    ECHOCARDIOGRAM REPORT   Patient Name:   MORAD TAL Date of Exam: 03/09/2021 Medical Rec #:  [id number]     Height:       69.0 in Accession #:    [id number]    Weight:       161.0 lb Date of Birth:  Apr 04, 1961     BSA:          1.884 m Patient Age:    59 years      BP:           115/98 mmHg Patient Gender: M             HR:           64 bpm. Exam Location:  Inpatient Procedure: 2D Echo, Cardiac Doppler and Color Doppler Indications:    CHF- Acute systolic  History:        Patient has no prior history of Echocardiogram examinations.                 CHF, CAD and Acute MI, Stroke; Risk Factors:Diabetes and                 Hypertension.  Sonographer:    Gertie Fey MHA, RDMS, RVT, RDCS Referring Phys: [id number] JARED M GARDNER IMPRESSIONS  1. Left ventricular ejection fraction, by estimation, is 20 to 25%. The left ventricle has severely decreased function. The left ventricle demonstrates global hypokinesis. Left ventricular diastolic parameters are consistent with Grade II diastolic dysfunction (pseudonormalization).  2. Right ventricular systolic function is normal. The right ventricular size is mildly enlarged. There is normal pulmonary artery systolic pressure. The estimated right ventricular systolic pressure is 24.0 mmHg.  3. Right atrial size was mildly dilated.  4. The mitral valve is normal in structure. Trivial mitral valve regurgitation. No evidence of mitral stenosis.  5. Tricuspid valve regurgitation is moderate.  6. The aortic  valve is tricuspid. Aortic valve regurgitation is not visualized. Mild aortic valve sclerosis is present, with no evidence of aortic valve stenosis.  7. The inferior vena cava is normal in size with greater than 50% respiratory variability, suggesting right atrial pressure of 3 mmHg. FINDINGS  Left Ventricle: Left ventricular ejection fraction, by estimation, is 20 to 25%. The left ventricle has severely decreased function. The left ventricle demonstrates global hypokinesis. The left ventricular internal cavity size was normal in size. There is no left ventricular hypertrophy. Left ventricular diastolic parameters are consistent with Grade II diastolic dysfunction (pseudonormalization). Right Ventricle: The right ventricular size is mildly enlarged. No increase in right ventricular wall thickness. Right ventricular systolic function is normal. There is normal pulmonary artery systolic pressure. The tricuspid regurgitant velocity is 2.29  m/s, and with an assumed right atrial pressure of 3 mmHg, the estimated right ventricular systolic pressure is 24.0 mmHg. Left Atrium: Left atrial size was normal in size. Right Atrium: Right atrial size was mildly dilated. Pericardium: There is no evidence of pericardial effusion. Mitral Valve: The mitral valve is normal in structure. There is mild thickening of the mitral valve leaflet(s). There is mild calcification of the mitral valve leaflet(s). Trivial mitral valve regurgitation. No evidence of mitral valve stenosis. Tricuspid Valve: The tricuspid valve is normal in structure. Tricuspid valve regurgitation is moderate . No evidence of tricuspid stenosis. Aortic Valve: The aortic valve is tricuspid. Aortic valve regurgitation is not visualized. Mild aortic valve sclerosis is present, with no evidence of aortic valve stenosis. Aortic valve mean gradient measures 2.5  mmHg. Aortic valve peak gradient measures 4.8 mmHg. Aortic valve area, by VTI measures 1.58 cm. Pulmonic Valve:  The pulmonic valve was normal in structure. Pulmonic valve regurgitation is not visualized. No evidence of pulmonic stenosis. Aorta: The aortic root is normal in size and structure. Venous: The inferior vena cava is normal in size with greater than 50% respiratory variability, suggesting right atrial pressure of 3 mmHg. IAS/Shunts: No atrial level shunt detected by color flow Doppler.  LEFT VENTRICLE PLAX 2D LVIDd:         4.90 cm      Diastology LVIDs:         4.50 cm      LV e' medial:    4.19 cm/s LV PW:         1.00 cm      LV E/e' medial:  11.3 LV IVS:        1.00 cm      LV e' lateral:   6.53 cm/s LVOT diam:     2.00 cm      LV E/e' lateral: 7.3 LV SV:         36 LV SV Index:   19 LVOT Area:     3.14 cm  LV Volumes (MOD) LV vol d, MOD A2C: 124.0 ml LV vol d, MOD A4C: 99.9 ml LV vol s, MOD A2C: 92.8 ml LV vol s, MOD A4C: 79.9 ml LV SV MOD A2C:     31.2 ml LV SV MOD A4C:     99.9 ml LV SV MOD BP:      25.6 ml RIGHT VENTRICLE RV S prime:     5.36 cm/s TAPSE (M-mode): 1.3 cm LEFT ATRIUM             Index        RIGHT ATRIUM           Index LA diam:        3.90 cm 2.07 cm/m   RA Area:     21.30 cm LA Vol (A2C):   51.5 ml 27.33 ml/m  RA Volume:   66.20 ml  35.14 ml/m LA Vol (A4C):   56.1 ml 29.78 ml/m LA Biplane Vol: 56.7 ml 30.09 ml/m  AORTIC VALVE AV Area (Vmax):    1.97 cm AV Area (Vmean):   1.83 cm AV Area (VTI):     1.58 cm AV Vmax:           109.00 cm/s AV Vmean:          72.500 cm/s AV VTI:            0.227 m AV Peak Grad:      4.8 mmHg AV Mean Grad:      2.5 mmHg LVOT Vmax:         68.40 cm/s LVOT Vmean:        42.200 cm/s LVOT VTI:          0.114 m LVOT/AV VTI ratio: 0.50  AORTA Ao Root diam: 3.00 cm MITRAL VALVE               TRICUSPID VALVE MV Area (PHT): 4.71 cm    TR Peak grad:   21.0 mmHg MV Decel Time: 161 msec    TR Vmax:        229.00 cm/s MV E velocity: 47.50 cm/s MV A velocity: 39.55 cm/s  SHUNTS MV E/A ratio:  1.20        Systemic VTI:  0.11 m  Systemic Diam:  2.00 cm Donato Schultz MD Electronically signed by Donato Schultz MD Signature Date/Time: 03/09/2021/1:53:07 PM    Final     Lab Data:  CBC: Recent Labs  Lab 03/08/21 0258 03/08/21 0316 03/09/21 0303 03/10/21 0351 03/11/21 0406 03/11/21 0959 03/11/21 1006 03/11/21 1008  WBC 7.1  --  5.8 6.7 7.2  --   --   --   NEUTROABS 4.1  --   --   --   --   --   --   --   HGB 15.2   < > 12.7* 13.2 12.7* 13.3 13.6 13.3  HCT 48.4   < > 39.1 39.8 38.7* 39.0 40.0 39.0  MCV 88.2  --  86.7 84.0 84.7  --   --   --   PLT 310  --  239 267 267  --   --   --    < > = values in this interval not displayed.   Basic Metabolic Panel: Recent Labs  Lab 03/08/21 0258 03/08/21 0316 03/08/21 0527 03/09/21 0303 03/10/21 0351 03/11/21 0406 03/11/21 0959 03/11/21 1006 03/11/21 1008 03/12/21 0355  NA 137   < >  --  139 144 143 143 143 144 142  K 4.5   < >  --  4.4 4.0 3.8 3.8 3.9 3.7 4.2  CL 100  --   --  101 102 99  --   --   --  101  CO2 26  --   --  31 34* 33*  --   --   --  32  GLUCOSE 298*  --   --  347* 169* 100*  --   --   --  117*  BUN 20  --   --  19 21* 19  --   --   --  24*  CREATININE 1.43*  --   --  1.48* 1.56* 1.65*  --   --   --  1.88*  CALCIUM 8.6*  --   --  8.1* 8.3* 8.1*  --   --   --  8.2*  MG  --   --  2.0 1.7 1.9 1.8  --   --   --   --   PHOS  --   --   --  4.4 4.2  --   --   --   --   --    < > = values in this interval not displayed.   GFR: Estimated Creatinine Clearance: 43.4 mL/min (A) (by C-G formula based on SCr of 1.88 mg/dL (H)). Liver Function Tests: Recent Labs  Lab 03/09/21 0303 03/10/21 0351  ALBUMIN 2.1* 2.1*   No results for input(s): LIPASE, AMYLASE in the last 168 hours. No results for input(s): AMMONIA in the last 168 hours. Coagulation Profile: No results for input(s): INR, PROTIME in the last 168 hours. Cardiac Enzymes: No results for input(s): CKTOTAL, CKMB, CKMBINDEX, TROPONINI in the last 168 hours. BNP (last 3 results) Recent Labs    02/03/21 0911   PROBNP 3,012.0*   HbA1C: No results for input(s): HGBA1C in the last 72 hours. CBG: Recent Labs  Lab 03/11/21 1614 03/11/21 1700 03/11/21 2121 03/12/21 0609 03/12/21 1106  GLUCAP 68* 85 185* 130* 259*   Lipid Profile: No results for input(s): CHOL, HDL, LDLCALC, TRIG, CHOLHDL, LDLDIRECT in the last 72 hours. Thyroid Function Tests: No results for input(s): TSH, T4TOTAL, FREET4, T3FREE, THYROIDAB in the last 72 hours. Anemia Panel: No results for input(s):  VITAMINB12, FOLATE, FERRITIN, TIBC, IRON, RETICCTPCT in the last 72 hours. Urine analysis:    Component Value Date/Time   COLORURINE YELLOW 03/08/2021 0246   APPEARANCEUR HAZY (A) 03/08/2021 0246   LABSPEC 1.019 03/08/2021 0246   PHURINE 5.0 03/08/2021 0246   GLUCOSEU 150 (A) 03/08/2021 0246   HGBUR NEGATIVE 03/08/2021 0246   BILIRUBINUR NEGATIVE 03/08/2021 0246   BILIRUBINUR negative 01/02/2021 1312   KETONESUR NEGATIVE 03/08/2021 0246   PROTEINUR >=300 (A) 03/08/2021 0246   UROBILINOGEN 0.2 01/02/2021 1312   NITRITE NEGATIVE 03/08/2021 0246   LEUKOCYTESUR NEGATIVE 03/08/2021 0246     Tuere Nwosu M.D. [facility] 03/12/2021, 2:44 PM  Available via Epic secure chat 7am-7pm After 7 pm, please refer to night coverage provider listed on amion.

## 2021-03-12 NOTE — Progress Notes (Signed)
Physical Therapy Treatment Patient Details Name: Thomas West MRN: [id number] DOB: 07-19-1960 Today's Date: 03/12/2021   History of Present Illness Thomas West is a 60 y.o. male with medical history significant of HFrEF, prior MI, DM2, HTN. Pt presents to ED with 2+ weeks of worsening peripheral edema, anasarca. Now up to abdomen. Associated orthopnea, DOE, no CP. Patient planning for cardiac cath on Thursday 10/20.    PT Comments    Noted decreased verbalization and cognitive status compared to prior sessions, discussed with OT. Pt was unable to maintain attention to carry a conversation, recall subject of discussion currently occurring, recall his own birthday even from 2 options to choose from, or follow simple one step commands consistently. Pt also with some perseveration. Assessed strength with pt displaying weakness in his R upper and R lower extremity compared to his L. MMT scores of the following noted: shoulder flexion 3-R 4L, R hand grip weaker than L (pt R-handed), elbow flexion 5 bil, unable to follow cues for hip flexion, knee extension 4-R 5L. Notified MD and RN immediately, attending MD came to assess with PT, decided to call Code Stroke and get stat head CT. During assessment with MD present, attempted to stand and assess change in functional mobility, with pt displaying increased incoordination and balance deficits, having difficulty advancing his R foot for R lateral side stepping, needing UE support and up to modA to prevent LOB for short bedroom distance gait bout. Will continue to follow acutely. Recommending a CIR consult due to pt being independent PTA and living in a 2-level house with his teenage son and now displaying a significant decline in functional status.     Recommendations for follow up therapy are one component of a multi-disciplinary discharge planning process, led by the attending physician.  Recommendations may be updated based on patient status, additional  functional criteria and insurance authorization.  Follow Up Recommendations  CIR;Supervision/Assistance - 24 hour     Equipment Recommendations  3in1 (PT)    Recommendations for Other Services       Precautions / Restrictions Precautions Precautions: Fall Restrictions Weight Bearing Restrictions: No     Mobility  Bed Mobility Overal bed mobility: Needs Assistance Bed Mobility: Supine to Sit;Sit to Supine     Supine to sit: Min guard;HOB elevated Sit to supine: Min guard;HOB elevated   General bed mobility comments: Min guard for safety, pt displaying difficulty pushing trunk up to sit from his R side.    Transfers Overall transfer level: Needs assistance Equipment used: 1 person hand held assist Transfers: Sit to/from Stand Sit to Stand: Min assist         General transfer comment: Cues for hand placement, pt needing increased time to process to come to stand, stabilizing self on PT with bil UEs, minA with noted unsteadiness/tremors.  Ambulation/Gait Ambulation/Gait assistance: Min assist;Mod assist Gait Distance (Feet): 10 Feet (x2 bouts of ~10 ft > ~3 ft) Assistive device: 1 person hand held assist;None Gait Pattern/deviations: Step-through pattern;Decreased step length - right;Decreased stride length;Staggering right;Staggering left;Ataxic Gait velocity: reduced Gait velocity interpretation: <1.31 ft/sec, indicative of household ambulator General Gait Details: During first bout, pt with bil UE support on PT initially due to noted tremulous movement and instability with minor R lean note, minA. Removed UE support with pt staggering either direction, almost ataxic-like gait pattern. During second bout, pt with bil UE support on PT again but having difficulty taking steps laterally to R, displaying poor R foot clearance and ability to  place R foot, modA to initiate and place R step.   Stairs             Wheelchair Mobility    Modified Rankin (Stroke  Patients Only) Modified Rankin (Stroke Patients Only) Pre-Morbid Rankin Score: No symptoms Modified Rankin: Moderately severe disability     Balance Overall balance assessment: Needs assistance Sitting-balance support: Feet supported Sitting balance-Leahy Scale: Good Sitting balance - Comments: Static sitting EOB with supervision.   Standing balance support: No upper extremity supported;Bilateral upper extremity supported Standing balance-Leahy Scale: Poor Standing balance comment: Up to modA and benefits from UE support for standing.                            Cognition Arousal/Alertness: Awake/alert;Lethargic Behavior During Therapy: Flat affect Overall Cognitive Status: Impaired/Different from baseline Area of Impairment: Orientation;Attention;Memory;Following commands;Awareness;Safety/judgement;Problem solving                 Orientation Level: Disoriented to;Person;Place;Situation;Time Current Attention Level: Focused Memory: Decreased short-term memory Following Commands: Follows one step commands inconsistently;Follows one step commands with increased time Safety/Judgement: Decreased awareness of safety;Decreased awareness of deficits Awareness: Intellectual Problem Solving: Slow processing;Decreased initiation;Difficulty sequencing;Requires verbal cues;Requires tactile cues General Comments: Per MD, some cognitive deficits at baseline. However, comparing today to prior PT/OT notes pt appears to have had a significant decline in cognition. Pt able to identify his name but was unable to recall his DOB. He was able to recall being born in the "11th" month but unable to recall any other info about DOB, including choosing the year from 2 options provided by PT. Pt with flat affect and blank stare majority of session, was sleeping upon arrival but was easily arousable. Pt with poor attention, drifting off and not finishing sentences after stating about 2-3 words of a  statement before stopping. Pt unable to recall subject of discussion a second later, just repeating what PT said. Pt perseverating on "yes". Following single step commands ~35% of time with delayed processing. Unable to follow more than one step commands.      Exercises      General Comments General comments (skin integrity, edema, etc.): Noted decreased verbalization and cognitive status compared to prior sessions, discussed with OT. Assessed strength with pt displaying weakness in his R upper and lower extremity compared to his L. MMT scores of the following noted: shoulder flexion 3-R 4L, R hand grip weaker than L (pt R-handed), elbow flexion 5 bil, unable to follow cues for hip flexion, knee extension 4-R 5L; possible slight L tongue drift noted, but questionable; unable to follow commands to smile for PT; notified MD and RN immediately, attending MD came to assess with PT, decided to call Code Stroke and get stat head CT; VSS      Pertinent Vitals/Pain Pain Assessment: Faces Faces Pain Scale: No hurt Pain Intervention(s): Monitored during session    Home Living                      Prior Function            PT Goals (current goals can now be found in the care plan section) Acute Rehab PT Goals Patient Stated Goal: did not state PT Goal Formulation: With patient Time For Goal Achievement: 03/23/21 Potential to Achieve Goals: Good Progress towards PT goals: Not progressing toward goals - comment (change in status)    Frequency    Min 3X/week  PT Plan Discharge plan needs to be updated    Co-evaluation              AM-PAC PT "6 Clicks" Mobility   Outcome Measure  Help needed turning from your back to your side while in a flat bed without using bedrails?: A Little Help needed moving from lying on your back to sitting on the side of a flat bed without using bedrails?: A Little Help needed moving to and from a bed to a chair (including a wheelchair)?: A  Lot Help needed standing up from a chair using your arms (e.g., wheelchair or bedside chair)?: A Little Help needed to walk in hospital room?: A Lot Help needed climbing 3-5 steps with a railing? : A Lot 6 Click Score: 15    End of Session   Activity Tolerance: Treatment limited secondary to medical complications (Comment) (change in status) Patient left: in bed;with call bell/phone within reach;with nursing/sitter in room;Other (comment) (with MD, getting ready to go for stat head CT) Nurse Communication: Mobility status PT Visit Diagnosis: Unsteadiness on feet (R26.81);Muscle weakness (generalized) (M62.81);Other abnormalities of gait and mobility (R26.89);Difficulty in walking, not elsewhere classified (R26.2);Other symptoms and signs involving the nervous system (R29.898)     Time: 1358-1440 PT Time Calculation (min) (ACUTE ONLY): 42 min  Charges:  $Gait Training: 8-22 mins $Therapeutic Activity: 23-37 mins                     Raymond Gurney, PT, DPT Acute Rehabilitation Services  Pager: [phone] Office: [phone]    Jewel Baize 03/12/2021, 4:28 PM

## 2021-03-12 NOTE — Progress Notes (Signed)
Called to the room by PT, patient more sleepy oriented x1 and tremor all over VSS Code stroke called, MD came to  see the pt. See epic for intervention. Stat CT of  the head done. Patient still sleepy. Patient stated he sleep like this at home. Will continue to monitor the patient    Code Status Orders  (From admission, onward)           Start     Ordered   03/08/21 0601  Full code  Continuous        03/08/21 0601           Code Status History     This patient has a current code status but no historical code status.

## 2021-03-12 NOTE — Consult Note (Addendum)
NEURO HOSPITALIST CONSULT NOTE   Requesting physician: Dr. Isidoro Donning  Reason for Consult: Acute onset of multifocal neurological changes  History obtained from:  Patient, RN and Chart     HPI:                                                                                                                                          Thomas West is an 60 y.o. male with a PMHx of history of HFrEF, CAD, DM2, HTN, HLD, CKD II-III, who was admitted 10/17 with acute on chronic combined systolic and diastolic CHF. He was diuresed and symptomatically improved somewhat, but was significantly deconditioned and diffusely weak. At the time of Cardiology follow up this morning, he was breathing better and felt significantly improved. His AM neurological exam was nonfocal per Cardiology note.   Later today, the patient was noted by PT to have confusion and possibly some asymmetric right arm drift as well as leaning to the right side. He was "completely off" relative to the AM exam performed earlier. Code Stroke was called.   Per Dr. Isidoro Donning he has some cognitive deficits at baseline.   Past Medical History:  Diagnosis Date   CHF (congestive heart failure) (HCC)    Coronary artery disease    Diabetes mellitus without complication (HCC)    History of MI (myocardial infarction) 03/05/2019   Hypertension    Stroke [facility])     Past Surgical History:  Procedure Laterality Date   leg surgery     "pins in left shin"   RIGHT/LEFT HEART CATH AND CORONARY ANGIOGRAPHY N/A 03/11/2021   Procedure: RIGHT/LEFT HEART CATH AND CORONARY ANGIOGRAPHY;  Surgeon: Orbie Pyo, MD;  Location: [facility] INVASIVE CV LAB;  Service: Cardiovascular;  Laterality: N/A;    No family history on file.     Social History:  reports that he quit smoking about 3 years ago. His smoking use included cigarettes. He has never used smokeless tobacco. He reports that he does not currently use alcohol. He reports current drug use.  Drug: Marijuana.  Allergies  Allergen Reactions   Bee Venom     swelling    MEDICATIONS:  I have reviewed the patient's current medications.  Current Facility-Administered Medications:    0.9 %  sodium chloride infusion, 250 mL, Intravenous, PRN, Orbie Pyo, MD   acetaminophen (TYLENOL) tablet 650 mg, 650 mg, Oral, Q4H PRN, Julian Reil, Jared M, DO   acetaminophen (TYLENOL) tablet 650 mg, 650 mg, Oral, Q4H PRN, Orbie Pyo, MD   [START ON 03/13/2021] aspirin EC tablet 81 mg, 81 mg, Oral, Daily, Alanda Slim, Taye T, MD   atorvastatin (LIPITOR) tablet 80 mg, 80 mg, Oral, Daily, Julian Reil, Jared M, DO, 80 mg at 03/12/21 1017   carvedilol (COREG) tablet 25 mg, 25 mg, Oral, BID, Julian Reil, Jared M, DO, 25 mg at 03/12/21 1017   clopidogrel (PLAVIX) tablet 75 mg, 75 mg, Oral, Daily, Julian Reil, Jared M, DO, 75 mg at 03/12/21 1017   docusate sodium (COLACE) capsule 100 mg, 100 mg, Oral, Q12H, Julian Reil, Jared M, DO, 100 mg at 03/12/21 1815   empagliflozin (JARDIANCE) tablet 10 mg, 10 mg, Oral, QAC breakfast, Julian Reil, Jared M, DO, 10 mg at 03/12/21 1017   enoxaparin (LOVENOX) injection 40 mg, 40 mg, Subcutaneous, Q24H, Julian Reil, Jared M, DO, 40 mg at 03/12/21 1017   [START ON 03/13/2021] furosemide (LASIX) tablet 40 mg, 40 mg, Oral, Daily, Kroeger, Krista M., PA-C   hydrALAZINE (APRESOLINE) tablet 50 mg, 50 mg, Oral, TID, Julian Reil, Jared M, DO, 50 mg at 03/12/21 1734   insulin aspart (novoLOG) injection 0-15 Units, 0-15 Units, Subcutaneous, TID WC, Rai, Ripudeep K, MD, 5 Units at 03/12/21 1736   insulin glargine-yfgn (SEMGLEE) injection 15 Units, 15 Units, Subcutaneous, Daily, Julian Reil, Jared M, DO, 15 Units at 03/12/21 1018   isosorbide mononitrate (IMDUR) 24 hr tablet 60 mg, 60 mg, Oral, Daily, Julian Reil, Jared M, DO, 60 mg at 03/12/21 1017   ondansetron (ZOFRAN) injection 4 mg, 4 mg,  Intravenous, Q6H PRN, Julian Reil, Jared M, DO   ondansetron [facility]) injection 4 mg, 4 mg, Intravenous, Q6H PRN, Orbie Pyo, MD   polyethylene glycol (MIRALAX / GLYCOLAX) packet 17 g, 17 g, Oral, Daily, Julian Reil, Jared M, DO, 17 g at 03/10/21 5284   sodium chloride flush (NS) 0.9 % injection 3 mL, 3 mL, Intravenous, Q12H, Thukkani, Charlies Constable, MD   sodium chloride flush (NS) 0.9 % injection 3 mL, 3 mL, Intravenous, PRN, Orbie Pyo, MD   ROS:                                                                                                                                       History obtained from the patient  General ROS: negative for - chills, fatigue, fever, night sweats, weight gain or weight loss Psychological ROS: negative for - behavioral disorder, hallucinations, memory difficulties, mood swings or suicidal ideation Ophthalmic ROS: negative for - blurry vision, double vision, eye pain or loss of vision ENT ROS: negative for - epistaxis, nasal discharge, oral lesions, sore throat, tinnitus or  vertigo Allergy and Immunology ROS: negative for - hives or itchy/watery eyes Hematological and Lymphatic ROS: negative for - bleeding problems, bruising or swollen lymph nodes Endocrine ROS: negative for - galactorrhea, hair pattern changes, polydipsia/polyuria or temperature intolerance Respiratory ROS: negative for - cough, hemoptysis. Some SOB.  Cardiovascular ROS: negative for - chest pain, dyspnea on exertion, edema or irregular heartbeat Gastrointestinal ROS: negative for - abdominal pain, diarrhea, hematemesis, nausea/vomiting or stool incontinence Genito-Urinary ROS: negative for - dysuria, hematuria, incontinence or urinary frequency/urgency Musculoskeletal ROS: negative for - joint swelling or muscular weakness Neurological ROS: as noted in HPI Dermatological ROS: negative for rash and skin lesion changes   Blood pressure 125/82, pulse 78, temperature 98.9 F (37.2 C), temperature  source Oral, resp. rate 20, height  (1.753 m), weight 71.5 kg, SpO2 94 %.   General Examination:                                                                                                       Physical Exam  HEENT-  Normocephalic, no lesions, without obvious abnormality.  Normal external eye and conjunctiva.   Cardiovascular- S1-S2 audible, pulses palpable throughout   Lungs-no rhonchi or wheezing noted, no excessive working breathing.  Saturations within normal limits Abdomen- All 4 quadrants palpated and nontender Extremities- Warm, dry and intact Musculoskeletal-no joint tenderness, deformity or swelling Skin-warm and dry, no hyperpigmentation, vitiligo, or suspicious lesions  Neurological Examination Mental Status: Alert, oriented, thought content appropriate.  Speech fluent without evidence of aphasia. He is confused and bradyphrenic. Follows some commands and has difficulty with others, such as the cerebellar exam. Unable to name objects. Able to repeat.  Cranial Nerves: II: Visual fields grossly normal,  III,IV, VI: ptosis not present, extra-ocular motions intact bilaterally pupils equal, round, reactive to light and accommodation V,VII: smile symmetric, facial light touch sensation normal bilaterally VIII: hearing normal bilaterally IX,X: uvula rises symmetrically XI: bilateral shoulder shrug XII: midline tongue extension Motor: Right : Upper extremity   4/5  Left:     Upper extremity   4/5  Lower extremity   4/5              Lower extremity   4/5 Tone and bulk:normal tone throughout; no atrophy noted Sensory: light touch intact throughout. Extinguished once to the right, but ? comprehension of exam. Temperature is intact and symmetric to face and all 4 extremities.  Coordination: Ataxic with FNF, but ? able to comprehend exam.   Lab Results: Basic Metabolic Panel: Recent Labs  Lab 03/08/21 0258 03/08/21 0316 03/08/21 0527 03/09/21 0303 03/10/21 0351  03/11/21 0406 03/11/21 0959 03/11/21 1006 03/11/21 1008 03/12/21 0355  NA 137   < >  --  139 144 143 143 143 144 142  K 4.5   < >  --  4.4 4.0 3.8 3.8 3.9 3.7 4.2  CL 100  --   --  101 102 99  --   --   --  101  CO2 26  --   --  31 34* 33*  --   --   --  32  GLUCOSE 298*  --   --  347* 169* 100*  --   --   --  117*  BUN 20  --   --  19 21* 19  --   --   --  24*  CREATININE 1.43*  --   --  1.48* 1.56* 1.65*  --   --   --  1.88*  CALCIUM 8.6*  --   --  8.1* 8.3* 8.1*  --   --   --  8.2*  MG  --   --  2.0 1.7 1.9 1.8  --   --   --   --   PHOS  --   --   --  4.4 4.2  --   --   --   --   --    < > = values in this interval not displayed.    CBC: Recent Labs  Lab 03/08/21 0258 03/08/21 0316 03/09/21 0303 03/10/21 0351 03/11/21 0406 03/11/21 0959 03/11/21 1006 03/11/21 1008  WBC 7.1  --  5.8 6.7 7.2  --   --   --   NEUTROABS 4.1  --   --   --   --   --   --   --   HGB 15.2   < > 12.7* 13.2 12.7* 13.3 13.6 13.3  HCT 48.4   < > 39.1 39.8 38.7* 39.0 40.0 39.0  MCV 88.2  --  86.7 84.0 84.7  --   --   --   PLT 310  --  239 267 267  --   --   --    < > = values in this interval not displayed.   Imaging: CARDIAC CATHETERIZATION  Result Date: 03/11/2021   Dist RCA lesion is 20% stenosed.   Prox LAD lesion is 30% stenosed.   Dist LAD lesion is 70% stenosed.   2nd Mrg lesion is 60% stenosed.   Previously placed RPAV stent (unknown type) is  widely patent.   LV end diastolic pressure is normal.   LV end diastolic pressure is normal.   Hemodynamic findings consistent with mild pulmonary hypertension. 1.  Mild obstructive coronary artery disease with patent RPLV stent. 2.  Normal cardiac output and index with mean right atrial pressure of 5 mmHg and mean wedge pressure of 5 mmHg.    CTH:  There is no acute intracranial hemorrhage or evidence of acute infarction. ASPECT score is 10. Chronic microvascular ischemic changes. Small subcentimeter meningioma of the tentorium.  Assessment: 60  year old male admitted on Tuesday for acute on chronic CHF. He underwent cardiac catheterization. Echocardiogram revealed an EF of 20-25% with severely decreased left ventricular function and global hypokinesis. Today, when working with PT, he was noted to be leaning to the right and a RUE pronator drift. Code stroke was called. No RUE drift appreciated.  1. Exam reveals findings most consistent with a delirium. No focal motor weakness noted, although there is diffuse weakness x 4. Mental status exam is abnormal; he is unable to name a finger, watch, or key; also is bradyphrenic.  2. The transient alteration of his exam noted by PT is felt most likely to have been secondary to his extreme fatigue and slight SOB.   Recommendations: Code stroke cancelled.  Delirium precautions.  Avoid medications on the [facility] list for the elderly.  Made attending aware of symptoms and result of code stroke   Jimmye Norman, MSN, APN-BC Neurology Nurse Practitioner Pager [phone]  Patient seen with MD.    I have seen and examined the patient. I have formulated the assessment and recommendations. My exam findings were observed and documented by the Neurohospitalist NP.  03/12/2021, 2:54 PM

## 2021-03-12 NOTE — Progress Notes (Signed)
   Inpatient Rehab Admissions Coordinator :  Per therapy recommendations, patient was screened for CIR candidacy by Ottie Glazier RN MSN due to therapy change in recommendations.  At this time patient appears to be a potential candidate for CIR pending further medical workup. I will place a rehab consult per protocol for full assessment. Please call me with any questions.  Ottie Glazier RN MSN Admissions Coordinator [phone]

## 2021-03-12 NOTE — Progress Notes (Signed)
Pt continues to be very fatigued. Pt had incontinent episode x1 early this morning.

## 2021-03-12 NOTE — Progress Notes (Signed)
Occupational Therapy Treatment Patient Details Name: Thomas West MRN: [id number] DOB: 17-May-1961 Today's Date: 03/12/2021   History of present illness Thomas West is a 60 y.o. male with medical history significant of HFrEF, prior MI, DM2, HTN. Pt presents to ED with 2+ weeks of worsening peripheral edema, anasarca. Now up to abdomen. Associated orthopnea, DOE, no CP. Patient planning for cardiac cath on Thursday 10/20.   OT comments  Planned to administer pill box test to further assess cognition and ability to manage medications. RN also present during session. Pt with continued flat affect, but noted difficulty following directions and inconsistent responses to questions. Unclear if presentation solely due to reported fatigue. Provided reading glasses though pt still unable to successfully read medication labels despite prompting. Pt ultimately unable to participate in pill box test and will need assist with med mgmt at home. Contacted pt's sister via phone who reports she lives across the street and can provide some support at DC. Based on today's presentation, recommending HHOT follow-up to ensure safety with ADL/IADL mgmt at home.    Recommendations for follow up therapy are one component of a multi-disciplinary discharge planning process, led by the attending physician.  Recommendations may be updated based on patient status, additional functional criteria and insurance authorization.    Follow Up Recommendations  Home health OT;Supervision - Intermittent    Equipment Recommendations  None recommended by OT    Recommendations for Other Services Other (comment) (HH aide, RN; will need assist with med mgmt at home)    Precautions / Restrictions Precautions Precautions: Fall Restrictions Weight Bearing Restrictions: No       Mobility Bed Mobility Overal bed mobility: Modified Independent             General bed mobility comments: light use of bedrails    Transfers                       Balance Overall balance assessment: Needs assistance Sitting-balance support: Feet supported Sitting balance-Leahy Scale: Good                                     ADL either performed or assessed with clinical judgement   ADL Overall ADL's : Needs assistance/impaired                                       General ADL Comments: Attempted to guide pt in pill box test to further assess ability to safely manage meds at home. Provided reading glasses to maximize success. However, pt unable to attend to task or follow directions to even read labels (or CHF handout provided). Pt reports his sister assists with medication at home, but OT called after with sister reporting she typically does not assist with medications but will pick them up from pharmacy for him at times.     Vision   Vision Assessment?: No apparent visual deficits   Perception     Praxis      Cognition Arousal/Alertness: Awake/alert;Lethargic Behavior During Therapy: Flat affect Overall Cognitive Status: No family/caregiver present to determine baseline cognitive functioning                                 General Comments: Pt with flat affect,  more confused than yesterday (though pt does report being tired). Pt unable to successfully follow directions or answer questions appropriately to complete pill box test as planned today. frequent cues to attend to tasks with minimal responses at times.        Exercises     Shoulder Instructions       General Comments RN present during session, gave pt insulin and reports he may need to do this at home as well - attempting to plan sister to come in with education prior to DC as well.    Pertinent Vitals/ Pain       Pain Assessment: No/denies pain  Home Living                                          Prior Functioning/Environment              Frequency  Min 2X/week         Progress Toward Goals  OT Goals(current goals can now be found in the care plan section)  Progress towards OT goals: Progressing toward goals  Acute Rehab OT Goals Patient Stated Goal: return home OT Goal Formulation: With patient Time For Goal Achievement: 03/25/21 Potential to Achieve Goals: Good ADL Goals Pt Will Transfer to Toilet: with modified independence;ambulating Additional ADL Goal #1: Pt to increase standing activity tolerance to > 10 min to improve endurance for ADLs/IADLs Additional ADL Goal #2: Pt to verbalize at least 3 strategies to minimize risk of CHF exacerbation Additional ADL Goal #3: Pt to complete pill box test with 0 errors  Plan Discharge plan needs to be updated    Co-evaluation                 AM-PAC OT "6 Clicks" Daily Activity     Outcome Measure   Help from another person eating meals?: None Help from another person taking care of personal grooming?: A Little Help from another person toileting, which includes using toliet, bedpan, or urinal?: A Little Help from another person bathing (including washing, rinsing, drying)?: A Little Help from another person to put on and taking off regular upper body clothing?: A Little Help from another person to put on and taking off regular lower body clothing?: A Little 6 Click Score: 19    End of Session    OT Visit Diagnosis: Unsteadiness on feet (R26.81);Other abnormalities of gait and mobility (R26.89);Muscle weakness (generalized) (M62.81)   Activity Tolerance Patient limited by lethargy;Patient limited by fatigue   Patient Left in bed;with call bell/phone within reach;with bed alarm set   Nurse Communication Mobility status        Time: 5638-7564 OT Time Calculation (min): 28 min  Charges: OT General Charges $OT Visit: 1 Visit OT Treatments $Self Care/Home Management : 23-37 mins  Bradd Canary, OTR/L Acute Rehab Services Office: [phone]   Lorre Munroe 03/12/2021, 1:20 PM

## 2021-03-12 NOTE — Code Documentation (Signed)
Stroke Response Nurse Documentation Code Documentation  Thomas West is a 60 y.o. male with past medical hx of diabetes, hypertension, coronary artery disease, CHF, stroke, and myocardial infarction. Admitted to [facility] for exacerbation of CHF. Heart cath and coronary angiography completed 10/20.   Patient has been lethargic. This morning he seemed more awake. Worked with rehab around 1400 who noted he had right sided weakness, confusion, delayed responses and perseveration. Also noted balance issues when ambulating. This is a change per rehab note from 10/19. Code stroke was activated by bedside RN.  Stroke team to bedside for exam. Patient was shivering but stated he was not cold. Patient to CT with team. NIHSS 3, see documentation for details and code stroke times. Patient with disoriented and right leg weakness on exam. The following imaging was completed: CT. Code stroke cancelled as stroke not suspected.   Ferman Hamming Stroke Response RN

## 2021-03-12 NOTE — Progress Notes (Addendum)
Inpatient Diabetes Program Recommendations  AACE/ADA: New Consensus Statement on Inpatient Glycemic Control (2015)  Target Ranges:  Prepandial:   less than 140 mg/dL      Peak postprandial:   less than 180 mg/dL (1-2 hours)      Critically ill patients:  140 - 180 mg/dL   Lab Results  Component Value Date   GLUCAP 259 (H) 03/12/2021   HGBA1C >14.0 (H) 02/03/2021    Review of Glycemic Control Results for GILLERMO, POCH (MRN [id number]) as of 03/12/2021 12:47  Ref. Range 03/10/2021 11:37 03/10/2021 15:50 03/10/2021 15:58 03/10/2021 18:18 03/10/2021 21:17 03/11/2021 06:08 03/11/2021 16:14 03/11/2021 17:00 03/11/2021 21:21 03/12/2021 06:09 03/12/2021 11:06  Glucose-Capillary Latest Ref Range: 70 - 99 mg/dL 425 (H) 956 (H) 387 (H) 182 (H) 93 101 (H) 68 (L) 85 185 (H) 130 (H) 259 (H)   Diabetes history: DM 2 Outpatient Diabetes medications:  Jardiance 10 mg daily, Glucotrol 10 mg daily, Metformin 500 mg bid Current orders for Inpatient glycemic control:  Novolog moderate tid with meals Jardiance 10 mg daily Semglee 15 units daily Inpatient Diabetes Program Recommendations:    Please reduce Semglee to 10 units daily.  Per RN, patient is sleepy and not able to give insulin independently.  Will follow up and talk to patient about this.  May need to just go home on oral agents for now and f/u with PCP.   Thanks,  Beryl Meager, RN, BC-ADM Inpatient Diabetes Coordinator Pager [phone]  (8a-5p)  Addendum:  Attempted to clarify with patient whether he is taking his insulin.  Note Code stroke earlier in the day was cancelled.  When I asked about insulin, patient states "yes I take it" and said "Lantus".  He could not however tell me how much he takes.  Patient would respond to my questions but then look away and not stay engaged.  Discussed with RN.  At this point, patient cannot clarify insulin dose or how he takes it.  May not be appropriate or able to independently take insulin at home.

## 2021-03-12 NOTE — Progress Notes (Addendum)
Progress Note  Patient Name: Thomas West Date of Encounter: 03/12/2021  [facility] Cardiologist: Charlton Haws, MD   Subjective   Reports breathing is better and he feels back to his baseline. No complaints of chest pain or palpitations.  Inpatient Medications    Scheduled Meds:  [START ON 03/13/2021] aspirin EC  81 mg Oral Daily   atorvastatin  80 mg Oral Daily   carvedilol  25 mg Oral BID   clopidogrel  75 mg Oral Daily   docusate sodium  100 mg Oral Q12H   empagliflozin  10 mg Oral QAC breakfast   enoxaparin (LOVENOX) injection  40 mg Subcutaneous Q24H   furosemide  60 mg Intravenous BID   gabapentin  100 mg Oral TID   hydrALAZINE  50 mg Oral TID   insulin aspart  0-15 Units Subcutaneous TID WC   insulin glargine-yfgn  15 Units Subcutaneous Daily   isosorbide mononitrate  60 mg Oral Daily   polyethylene glycol  17 g Oral Daily   sacubitril-valsartan  1 tablet Oral BID   sodium chloride flush  3 mL Intravenous Q12H   sodium chloride flush  3 mL Intravenous Q12H   Continuous Infusions:  sodium chloride     sodium chloride     PRN Meds: sodium chloride, sodium chloride, acetaminophen, acetaminophen, ondansetron (ZOFRAN) IV, ondansetron (ZOFRAN) IV, sodium chloride flush, sodium chloride flush   Vital Signs    Vitals:   03/11/21 2000 03/12/21 0136 03/12/21 0425 03/12/21 0841  BP: 115/63  127/80 120/74  Pulse:   75 74  Resp:   (!) 22 20  Temp:   98 F (36.7 C) 98.8 F (37.1 C)  TempSrc:   Oral Oral  SpO2:   96%   Weight:  71.5 kg    Height:        Intake/Output Summary (Last 24 hours) at 03/12/2021 0944 Last data filed at 03/12/2021 0137 Gross per 24 hour  Intake 360 ml  Output 4200 ml  Net -3840 ml   Last 3 Weights 03/12/2021 03/11/2021 03/10/2021  Weight (lbs) 157 lb 11.2 oz 166 lb 3.2 oz 170 lb 12.8 oz  Weight (kg) 71.532 kg 75.388 kg 77.474 kg      Telemetry    Sinus rhythm with occasional PVCs/paired PVCs - Personally Reviewed  ECG     03/11/21 - sinus rhythm, rate 77 bpm, inferolateral TWI, no STE/D - Personally Reviewed  Physical Exam   GEN: No acute distress.   Neck: No JVD Cardiac: RRR, no murmurs, rubs, or gallops.  Respiratory: Clear to auscultation bilaterally. GI: Soft, nontender, non-distended  MS: 1+ LE edema; No deformity. Neuro:  Nonfocal  Psych: Normal affect   Labs    High Sensitivity Troponin:   Recent Labs  Lab 03/08/21 0258 03/08/21 0527  TROPONINIHS 20* 22*     Chemistry Recent Labs  Lab 03/09/21 0303 03/10/21 0351 03/11/21 0406 03/11/21 0959 03/11/21 1006 03/11/21 1008 03/12/21 0355  NA 139 144 143   < > 143 144 142  K 4.4 4.0 3.8   < > 3.9 3.7 4.2  CL 101 102 99  --   --   --  101  CO2 31 34* 33*  --   --   --  32  GLUCOSE 347* 169* 100*  --   --   --  117*  BUN 19 21* 19  --   --   --  24*  CREATININE 1.48* 1.56* 1.65*  --   --   --  1.88*  CALCIUM 8.1* 8.3* 8.1*  --   --   --  8.2*  MG 1.7 1.9 1.8  --   --   --   --   ALBUMIN 2.1* 2.1*  --   --   --   --   --   GFRNONAA 55* 51* 48*  --   --   --  41*  ANIONGAP 7 8 11   --   --   --  9   < > = values in this interval not displayed.    Lipids No results for input(s): CHOL, TRIG, HDL, LABVLDL, LDLCALC, CHOLHDL in the last 168 hours.  Hematology Recent Labs  Lab 03/09/21 0303 03/10/21 0351 03/11/21 0406 03/11/21 0959 03/11/21 1006 03/11/21 1008  WBC 5.8 6.7 7.2  --   --   --   RBC 4.51 4.74 4.57  --   --   --   HGB 12.7* 13.2 12.7* 13.3 13.6 13.3  HCT 39.1 39.8 38.7* 39.0 40.0 39.0  MCV 86.7 84.0 84.7  --   --   --   MCH 28.2 27.8 27.8  --   --   --   MCHC 32.5 33.2 32.8  --   --   --   RDW 13.8 13.8 14.0  --   --   --   PLT 239 267 267  --   --   --    Thyroid No results for input(s): TSH, FREET4 in the last 168 hours.  BNP Recent Labs  Lab 03/08/21 0258 03/09/21 0304  BNP 3,620.7* 2,895.4*    DDimer No results for input(s): DDIMER in the last 168 hours.   Radiology    CARDIAC  CATHETERIZATION  Result Date: 03/11/2021   Dist RCA lesion is 20% stenosed.   Prox LAD lesion is 30% stenosed.   Dist LAD lesion is 70% stenosed.   2nd Mrg lesion is 60% stenosed.   Previously placed RPAV stent (unknown type) is  widely patent.   LV end diastolic pressure is normal.   LV end diastolic pressure is normal.   Hemodynamic findings consistent with mild pulmonary hypertension. 1.  Mild obstructive coronary artery disease with patent RPLV stent. 2.  Normal cardiac output and index with mean right atrial pressure of 5 mmHg and mean wedge pressure of 5 mmHg.    Cardiac Studies   Echocardiogram 03/09/21: 1. Left ventricular ejection fraction, by estimation, is 20 to 25%. The  left ventricle has severely decreased function. The left ventricle  demonstrates global hypokinesis. Left ventricular diastolic parameters are  consistent with Grade II diastolic  dysfunction (pseudonormalization).   2. Right ventricular systolic function is normal. The right ventricular  size is mildly enlarged. There is normal pulmonary artery systolic  pressure. The estimated right ventricular systolic pressure is 24.0 mmHg.   3. Right atrial size was mildly dilated.   4. The mitral valve is normal in structure. Trivial mitral valve  regurgitation. No evidence of mitral stenosis.   5. Tricuspid valve regurgitation is moderate.   6. The aortic valve is tricuspid. Aortic valve regurgitation is not  visualized. Mild aortic valve sclerosis is present, with no evidence of  aortic valve stenosis.   7. The inferior vena cava is normal in size with greater than 50%  respiratory variability, suggesting right atrial pressure of 3 mmHg.   R/LHC 03/11/21:     Dist RCA lesion is 20% stenosed.   Prox LAD lesion is 30% stenosed.   Dist  LAD lesion is 70% stenosed.   2nd Mrg lesion is 60% stenosed.   Previously placed RPAV stent (unknown type) is  widely patent.   LV end diastolic pressure is normal.   LV end  diastolic pressure is normal.   Hemodynamic findings consistent with mild pulmonary hypertension.   1.  Mild obstructive coronary artery disease with patent RPLV stent. 2.  Normal cardiac output and index with mean right atrial pressure of 5 mmHg and mean wedge pressure of 5 mmHg.    Patient Profile     60 y.o. male with a PMH of CAD s/p stenting to RPLV, chronic combined CHF, HTN, HLD, DM type 2, and CKD stage 2-3, who is being followed by cardiology for the evaluation of acute on chronic combined CHF.   Assessment & Plan    1. Acute on chronic combined CHF: EF 20-25% with G2DD on echo this admission He has been diuresing with IV lasix 60mg  BID with UOP net - 3.8L in the past 24 hours and -12.4 L this admission. Weight is down to 157lbs from 172lbs on presentation. He underwent R/LHC yesterday with non-obstructive CAD and normal LVEDP. Cr bumped to 1.88 today from 1.65 yesterday (baseline 1.3-1.5).  - Will hold IV lasix today and resume po lasix 40mg  daily tomorrow.  - Continue carvedilol, hydralazine, imdur, and entresto.  - Continue jardiance  - Consider addition of spironolactone at follow-up if Cr normalizes and BP stable.  - Continue to monitor strict I &Os and daily weights - Continue to monitor electrolytes and replete as needed to maintain K >4, Mg >2  2. CAD s/p stenting to RPLV in 2020: patient underwent stenting to RPLV at [facility] in 2021. Cath yesterday shows 30% pLAD stenosis, 70% dLAD stenosis, 60% OM2 stenosis, 20% dRCA stenosis, and patent RPLV stent (unknown type). Recommended for ongoing medical management - Continue aspirin and plavix - Continue statin - Continue Bblocker and imdur  3. HTN: BP soft at times but stable - Managed in the context of #1  4. HLD: LDL 120 01/2021 at which time his atorvastatin was increased from 40mg  daily to 80mg  daily. He tells me he has been compliant with his atorvastatin   - Continue atorvastatin  - Will need repeat FLP/LFTs  in 1 month - if LDL not at goal of <70, consider referral to lipid clinic  5. DM type 2: A1C >14 01/2021. On metformin, glipizide, insulin, and jardiance outpatient - Continue management per primary team - Okay to restart metformin 48 hours post cath  6. CKD stage 2-3: Cr up to 1.88 today from 1.65 yesterday (baseline 1.3-1.5).  - Will hold lasix today as LVEDP was normal on RHC yesterday - Continue to monitor closely    Suspect he will be ready for discharge tomorrow. He is scheduled to be seen in the [facility] impact clinic 03/19/21 at 3pm for close follow-up.   For questions or updates, please contact [facility] Please consult [email or web address] for contact info under        Signed, , PA-C  03/12/2021, 9:44 AM    Patient examined chart reviewed. Lungs clear JVP down no murmur LE edema improved Cath with patent stents to RPLV and no other critical CAD On good GDMT for CHF. Concern for outpatient f/u and compliance especially regarding diabetes Agree with d/c in am and close f/u of BMET  [facility] MD [facility]

## 2021-03-13 ENCOUNTER — Other Ambulatory Visit (HOSPITAL_COMMUNITY): Payer: Self-pay

## 2021-03-13 DIAGNOSIS — I252 Old myocardial infarction: Secondary | ICD-10-CM | POA: Diagnosis not present

## 2021-03-13 DIAGNOSIS — I5023 Acute on chronic systolic (congestive) heart failure: Secondary | ICD-10-CM | POA: Diagnosis not present

## 2021-03-13 DIAGNOSIS — N182 Chronic kidney disease, stage 2 (mild): Secondary | ICD-10-CM | POA: Diagnosis not present

## 2021-03-13 DIAGNOSIS — I509 Heart failure, unspecified: Secondary | ICD-10-CM | POA: Diagnosis not present

## 2021-03-13 DIAGNOSIS — E1140 Type 2 diabetes mellitus with diabetic neuropathy, unspecified: Secondary | ICD-10-CM | POA: Diagnosis not present

## 2021-03-13 LAB — GLUCOSE, CAPILLARY
Glucose-Capillary: 126 mg/dL — ABNORMAL HIGH (ref 70–99)
Glucose-Capillary: 219 mg/dL — ABNORMAL HIGH (ref 70–99)
Glucose-Capillary: 171 mg/dL — ABNORMAL HIGH (ref 70–99)
Glucose-Capillary: 128 mg/dL — ABNORMAL HIGH (ref 70–99)

## 2021-03-13 LAB — BASIC METABOLIC PANEL
Anion gap: 6 (ref 5–15)
BUN: 22 mg/dL — ABNORMAL HIGH (ref 6–20)
CO2: 31 mmol/L (ref 22–32)
Calcium: 8 mg/dL — ABNORMAL LOW (ref 8.9–10.3)
Chloride: 103 mmol/L (ref 98–111)
Creatinine, Ser: 1.72 mg/dL — ABNORMAL HIGH (ref 0.61–1.24)
GFR, Estimated: 46 mL/min — ABNORMAL LOW (ref >60)
Glucose, Bld: 137 mg/dL — ABNORMAL HIGH (ref 70–99)
Potassium: 4.1 mmol/L (ref 3.5–5.1)
Sodium: 140 mmol/L (ref 135–145)

## 2021-03-13 LAB — LIPID PANEL
Cholesterol: 138 mg/dL (ref 0–200)
HDL: 40 mg/dL — ABNORMAL LOW (ref >40)
LDL Cholesterol: 86 mg/dL (ref 0–99)
Total CHOL/HDL Ratio: 3.5 RATIO
Triglycerides: 60 mg/dL (ref <150)
VLDL: 12 mg/dL (ref 0–40)

## 2021-03-13 NOTE — Progress Notes (Signed)
[facility]                                                                              Patient Demographics  Thomas West, is a 60 y.o. male, DOB - 1960/12/04, TGP:[id number]  Admit date - 03/08/2021   Admitting Physician Almon Hercules, MD  Outpatient Primary MD for the patient is [name], [name] Expose, MD  Outpatient specialists:   LOS - 4  days   Medical records reviewed and are as summarized below:    Chief Complaint  Patient presents with   Shortness of Breath       Brief summary   60 year old male with history of systolic CHF, CAD, DM-2, HTN, CVA and CKD-3A presenting with progressive edema, orthopnea and dyspnea on exertion for about 2 weeks.  Patient moved from [place] to [place] about a year ago.  He has not established care with PCP, and has not been able to fill his medications.  He is not sure about taking diuretics.  He also admits to taking ibuprofen twice daily.  Hypertensive to 180/120s.  BNP elevated to 3600.  Admitted for acute on chronic systolic CHF with marked anasarca, and started on IV Lasix.    Assessment & Plan    Principal Problem: Acute encephalopathy with dizziness, gait instability, -Much more alert and oriented today.  Work-up negative.  Evaluated by neurology.  -PT evaluation recommended CIR -Neurontin discontinued, B12, folate, ammonia level normal.  B1 pending -Creatinine improving       Acute on chronic combined systolic and diastolic CHF -Patient presented with progressive edema, orthopnea, dyspnea on exertion, volume overload, elevated BNP -Patient was placed on IV Lasix 60 mg twice daily, negative balance of 12.9 L -2D echo showed EF of 20 to 25% with severely decreased LV function, global hypokinesis, grade 2DD -Underwent cardiac cath on 10/19, mild obstructive CAD with patent RPLV stent -Entresto held due to renal insufficiency, Lasix transitioned to 40 mg p.o. daily   Hypertensive urgency -BP now stable,  continue Coreg, BiDil, Lasix.     History of MI (myocardial infarction), CAD -Has been noncompliant,  -Continue aspirin, Plavix, statin   Type 2 diabetes mellitus with diabetic neuropathy, unspecified (HCC) -Uncontrolled, hemoglobin A1c> 14.0 -Continue sliding scale insulin, basal insulin while inpatient -At the time of discharge will place on metformin XL, glipizide XL and semaglutide 3 mg  PO daily  AKI on CKD stage IIIa -Baseline creatinine 1.4-1.5 -Creatinine trended up to 1.8 likely due to diuresis, Entresto  -Lasix transitioned to oral, Entresto currently on hold, creatinine trending down closer to baseline  Code Status: Full  DVT Prophylaxis:  Place TED hose Start: 03/12/21 1113 Place and maintain sequential compression device Start: 03/09/21 0903 enoxaparin (LOVENOX) injection 40 mg Start: 03/08/21 1000   Level of Care: Level of care: Telemetry Cardiac Family Communication: Discussed all imaging results, lab results, explained to the patient    Disposition Plan:     Status is: Inpatient  Remains inpatient appropriate because: Need for CIR   Time Spent in minutes 35 minutes  Procedures:  2D echo  Consultants:   Cardiology   Antimicrobials:  Anti-infectives (From admission, onward)    None          Medications  Scheduled Meds:  aspirin EC  81 mg Oral Daily   atorvastatin  80 mg Oral Daily   carvedilol  25 mg Oral BID   clopidogrel  75 mg Oral Daily   docusate sodium  100 mg Oral Q12H   empagliflozin  10 mg Oral QAC breakfast   enoxaparin (LOVENOX) injection  40 mg Subcutaneous Q24H   furosemide  40 mg Oral Daily   hydrALAZINE  50 mg Oral TID   insulin aspart  0-15 Units Subcutaneous TID WC   insulin glargine-yfgn  15 Units Subcutaneous Daily   isosorbide mononitrate  60 mg Oral Daily   polyethylene glycol  17 g Oral Daily   sodium chloride flush  3 mL Intravenous Q12H   Continuous Infusions:  sodium chloride     PRN Meds:.sodium chloride,  acetaminophen, acetaminophen, ondansetron (ZOFRAN) IV, ondansetron (ZOFRAN) IV, sodium chloride flush      Subjective:   [name] was seen and examined today.  Much more alert and oriented today.  No acute issues overnight.  No fevers or chills.  Feels better today, states was fatigued yesterday.   Objective:   Vitals:   03/12/21 2300 03/12/21 2316 03/13/21 0446 03/13/21 1147  BP:  122/79 134/90 127/61  Pulse:   73 72  Resp: (!) 24 20 (!) 24 16  Temp:  98.4 F (36.9 C) 98.6 F (37 C) 98.3 F (36.8 C)  TempSrc:  Oral Oral Oral  SpO2:  93% 98% 96%  Weight:   72.5 kg   Height:        Intake/Output Summary (Last 24 hours) at 03/13/2021 1519 Last data filed at 03/13/2021 0449 Gross per 24 hour  Intake 840 ml  Output 1770 ml  Net -930 ml     Wt Readings from Last 3 Encounters:  03/13/21 72.5 kg  02/03/21 73 kg  03/27/19 79.7 kg    Physical Exam General: Alert and oriented x 3, NAD Cardiovascular: S1 S2 clear, RRR. No pedal edema b/l Respiratory: CTAB, no wheezing, rales or rhonchi Gastrointestinal: Soft, nontender, nondistended, NBS Ext: no pedal edema bilaterally Neuro: no new deficits   Data Reviewed:  I have personally reviewed following labs and imaging studies  Micro Results Recent Results (from the past 240 hour(s))  Resp Panel by RT-PCR (Flu A&B, Covid) Nasopharyngeal Swab     Status: None   Collection Time: 03/08/21  6:48 AM   Specimen: Nasopharyngeal Swab; Nasopharyngeal(NP) swabs in vial transport medium  Result Value Ref Range Status   SARS Coronavirus 2 by RT PCR NEGATIVE NEGATIVE Final    Comment: (NOTE) SARS-CoV-2 target nucleic acids are NOT DETECTED.  The SARS-CoV-2 RNA is generally detectable in upper respiratory specimens during the acute phase of infection. The lowest concentration of SARS-CoV-2 viral copies this assay can detect is 138 copies/mL. A negative result does not preclude SARS-Cov-2 infection and should not be used as  the sole basis for treatment or other patient management decisions. A negative result may occur with  improper specimen collection/handling, submission of specimen other than nasopharyngeal swab, presence of viral mutation(s) within the areas targeted by this assay, and inadequate number of viral copies(<138 copies/mL). A negative result must be combined with clinical observations, patient history, and epidemiological information. The expected result is Negative.  Fact Sheet for Patients:  BloggerCourse.com  Fact Sheet for Healthcare Providers:  SeriousBroker.it  This test is  no t yet approved or cleared by the [place] and  has been authorized for detection and/or diagnosis of SARS-CoV-2 by FDA under an Emergency Use Authorization (EUA). This EUA will remain  in effect (meaning this test can be used) for the duration of the COVID-19 declaration under Section 564(b)(1) of the Act, 21 U.S.C.section 360bbb-3(b)(1), unless the authorization is terminated  or revoked sooner.       Influenza A by PCR NEGATIVE NEGATIVE Final   Influenza B by PCR NEGATIVE NEGATIVE Final    Comment: (NOTE) The Xpert Xpress SARS-CoV-2/FLU/RSV plus assay is intended as an aid in the diagnosis of influenza from Nasopharyngeal swab specimens and should not be used as a sole basis for treatment. Nasal washings and aspirates are unacceptable for Xpert Xpress SARS-CoV-2/FLU/RSV testing.  Fact Sheet for Patients: BloggerCourse.com  Fact Sheet for Healthcare Providers: SeriousBroker.it  This test is not yet approved or cleared by the [place] FDA and has been authorized for detection and/or diagnosis of SARS-CoV-2 by FDA under an Emergency Use Authorization (EUA). This EUA will remain in effect (meaning this test can be used) for the duration of the COVID-19 declaration under Section 564(b)(1) of  the Act, 21 U.S.C. section 360bbb-3(b)(1), unless the authorization is terminated or revoked.  Performed at [facility], [address] [place], [place] [address]     Radiology Reports DG Chest 2 View  Result Date: 03/08/2021 CLINICAL DATA:  Shortness of breath EXAM: CHEST - 2 VIEW COMPARISON:  02/06/2018 FINDINGS: Cardiac shadow is enlarged but stable. Aortic calcifications are seen. Small pleural effusions are noted bilaterally. Focal airspace opacity is noted in the medial right lung base projecting in the right lower lobe. No bony abnormality is noted. IMPRESSION: Right lower lobe infiltrate with small pleural effusions bilaterally. Electronically Signed   By: Alcide Clever M.D.   On: 03/08/2021 03:31   CARDIAC CATHETERIZATION  Result Date: 03/11/2021   Dist RCA lesion is 20% stenosed.   Prox LAD lesion is 30% stenosed.   Dist LAD lesion is 70% stenosed.   2nd Mrg lesion is 60% stenosed.   Previously placed RPAV stent (unknown type) is  widely patent.   LV end diastolic pressure is normal.   LV end diastolic pressure is normal.   Hemodynamic findings consistent with mild pulmonary hypertension. 1.  Mild obstructive coronary artery disease with patent RPLV stent. 2.  Normal cardiac output and index with mean right atrial pressure of 5 mmHg and mean wedge pressure of 5 mmHg.   ECHOCARDIOGRAM COMPLETE  Result Date: 03/09/2021    ECHOCARDIOGRAM REPORT   Patient Name:   DONAVON KIMREY Date of Exam: 03/09/2021 Medical Rec #:  [id number]     Height:       69.0 in Accession #:    [id number]    Weight:       161.0 lb Date of Birth:  11-05-1960     BSA:          1.884 m Patient Age:    59 years      BP:           115/98 mmHg Patient Gender: M             HR:           64 bpm. Exam Location:  Inpatient Procedure: 2D Echo, Cardiac Doppler and Color Doppler Indications:    CHF- Acute systolic  History:        Patient has no  prior history of Echocardiogram examinations.                 CHF, CAD and  Acute MI, Stroke; Risk Factors:Diabetes and                 Hypertension.  Sonographer:    Gertie Fey MHA, RDMS, RVT, RDCS Referring Phys: [id number] JARED M GARDNER IMPRESSIONS  1. Left ventricular ejection fraction, by estimation, is 20 to 25%. The left ventricle has severely decreased function. The left ventricle demonstrates global hypokinesis. Left ventricular diastolic parameters are consistent with Grade II diastolic dysfunction (pseudonormalization).  2. Right ventricular systolic function is normal. The right ventricular size is mildly enlarged. There is normal pulmonary artery systolic pressure. The estimated right ventricular systolic pressure is 24.0 mmHg.  3. Right atrial size was mildly dilated.  4. The mitral valve is normal in structure. Trivial mitral valve regurgitation. No evidence of mitral stenosis.  5. Tricuspid valve regurgitation is moderate.  6. The aortic valve is tricuspid. Aortic valve regurgitation is not visualized. Mild aortic valve sclerosis is present, with no evidence of aortic valve stenosis.  7. The inferior vena cava is normal in size with greater than 50% respiratory variability, suggesting right atrial pressure of 3 mmHg. FINDINGS  Left Ventricle: Left ventricular ejection fraction, by estimation, is 20 to 25%. The left ventricle has severely decreased function. The left ventricle demonstrates global hypokinesis. The left ventricular internal cavity size was normal in size. There is no left ventricular hypertrophy. Left ventricular diastolic parameters are consistent with Grade II diastolic dysfunction (pseudonormalization). Right Ventricle: The right ventricular size is mildly enlarged. No increase in right ventricular wall thickness. Right ventricular systolic function is normal. There is normal pulmonary artery systolic pressure. The tricuspid regurgitant velocity is 2.29  m/s, and with an assumed right atrial pressure of 3 mmHg, the estimated right ventricular systolic  pressure is 24.0 mmHg. Left Atrium: Left atrial size was normal in size. Right Atrium: Right atrial size was mildly dilated. Pericardium: There is no evidence of pericardial effusion. Mitral Valve: The mitral valve is normal in structure. There is mild thickening of the mitral valve leaflet(s). There is mild calcification of the mitral valve leaflet(s). Trivial mitral valve regurgitation. No evidence of mitral valve stenosis. Tricuspid Valve: The tricuspid valve is normal in structure. Tricuspid valve regurgitation is moderate . No evidence of tricuspid stenosis. Aortic Valve: The aortic valve is tricuspid. Aortic valve regurgitation is not visualized. Mild aortic valve sclerosis is present, with no evidence of aortic valve stenosis. Aortic valve mean gradient measures 2.5 mmHg. Aortic valve peak gradient measures 4.8 mmHg. Aortic valve area, by VTI measures 1.58 cm. Pulmonic Valve: The pulmonic valve was normal in structure. Pulmonic valve regurgitation is not visualized. No evidence of pulmonic stenosis. Aorta: The aortic root is normal in size and structure. Venous: The inferior vena cava is normal in size with greater than 50% respiratory variability, suggesting right atrial pressure of 3 mmHg. IAS/Shunts: No atrial level shunt detected by color flow Doppler.  LEFT VENTRICLE PLAX 2D LVIDd:         4.90 cm      Diastology LVIDs:         4.50 cm      LV e' medial:    4.19 cm/s LV PW:         1.00 cm      LV E/e' medial:  11.3 LV IVS:        1.00 cm  LV e' lateral:   6.53 cm/s LVOT diam:     2.00 cm      LV E/e' lateral: 7.3 LV SV:         36 LV SV Index:   19 LVOT Area:     3.14 cm  LV Volumes (MOD) LV vol d, MOD A2C: 124.0 ml LV vol d, MOD A4C: 99.9 ml LV vol s, MOD A2C: 92.8 ml LV vol s, MOD A4C: 79.9 ml LV SV MOD A2C:     31.2 ml LV SV MOD A4C:     99.9 ml LV SV MOD BP:      25.6 ml RIGHT VENTRICLE RV S prime:     5.36 cm/s TAPSE (M-mode): 1.3 cm LEFT ATRIUM             Index        RIGHT ATRIUM            Index LA diam:        3.90 cm 2.07 cm/m   RA Area:     21.30 cm LA Vol (A2C):   51.5 ml 27.33 ml/m  RA Volume:   66.20 ml  35.14 ml/m LA Vol (A4C):   56.1 ml 29.78 ml/m LA Biplane Vol: 56.7 ml 30.09 ml/m  AORTIC VALVE AV Area (Vmax):    1.97 cm AV Area (Vmean):   1.83 cm AV Area (VTI):     1.58 cm AV Vmax:           109.00 cm/s AV Vmean:          72.500 cm/s AV VTI:            0.227 m AV Peak Grad:      4.8 mmHg AV Mean Grad:      2.5 mmHg LVOT Vmax:         68.40 cm/s LVOT Vmean:        42.200 cm/s LVOT VTI:          0.114 m LVOT/AV VTI ratio: 0.50  AORTA Ao Root diam: 3.00 cm MITRAL VALVE               TRICUSPID VALVE MV Area (PHT): 4.71 cm    TR Peak grad:   21.0 mmHg MV Decel Time: 161 msec    TR Vmax:        229.00 cm/s MV E velocity: 47.50 cm/s MV A velocity: 39.55 cm/s  SHUNTS MV E/A ratio:  1.20        Systemic VTI:  0.11 m                            Systemic Diam: 2.00 cm Donato Schultz MD Electronically signed by Donato Schultz MD Signature Date/Time: 03/09/2021/1:53:07 PM    Final    CT HEAD CODE STROKE WO CONTRAST  Result Date: 03/12/2021 CLINICAL DATA:  Code stroke.  Right-sided weakness EXAM: CT HEAD WITHOUT CONTRAST TECHNIQUE: Contiguous axial images were obtained from the base of the skull through the vertex without intravenous contrast. COMPARISON:  None. FINDINGS: Brain: There is no acute intracranial hemorrhage, mass effect, or edema. Gray-white differentiation is preserved. Patchy low-density in the supratentorial white matter is nonspecific but probably reflects mild to moderate chronic microvascular ischemic changes. Subcentimeter hyperdensity along the left aspect of the tentorium (series 6, image 21) is most consistent with a small meningioma. Vascular: No hyperdense vessel. There is intracranial atherosclerotic calcification at the skull  base. Skull: Unremarkable. Sinuses/Orbits: Patchy paranasal sinus mucosal thickening. Orbits are unremarkable. Other: Mastoid air cells are clear.  Right posterior maxillary molar periapical lucency. ASPECTS ([place]berta Stroke Program Early CT Score) - Ganglionic level infarction (caudate, lentiform nuclei, internal capsule, insula, M1-M3 cortex): 7 - Supraganglionic infarction (M4-M6 cortex): 3 Total score (0-10 with 10 being normal): 10 IMPRESSION: There is no acute intracranial hemorrhage or evidence of acute infarction. ASPECT score is 10. Chronic microvascular ischemic changes. Small subcentimeter meningioma of the tentorium. These results were communicated to Dr. Otelia Limes at 3:08 pm on 03/12/2021 by text page via the [facility] messaging system. Electronically Signed   By: Guadlupe Spanish M.D.   On: 03/12/2021 15:11    Lab Data:  CBC: Recent Labs  Lab 03/08/21 0258 03/08/21 0316 03/09/21 0303 03/10/21 0351 03/11/21 0406 03/11/21 0959 03/11/21 1006 03/11/21 1008  WBC 7.1  --  5.8 6.7 7.2  --   --   --   NEUTROABS 4.1  --   --   --   --   --   --   --   HGB 15.2   < > 12.7* 13.2 12.7* 13.3 13.6 13.3  HCT 48.4   < > 39.1 39.8 38.7* 39.0 40.0 39.0  MCV 88.2  --  86.7 84.0 84.7  --   --   --   PLT 310  --  239 267 267  --   --   --    < > = values in this interval not displayed.   Basic Metabolic Panel: Recent Labs  Lab 03/08/21 0527 03/09/21 0303 03/10/21 0351 03/11/21 0406 03/11/21 0959 03/11/21 1006 03/11/21 1008 03/12/21 0355 03/13/21 0312  NA  --  139 144 143 143 143 144 142 140  K  --  4.4 4.0 3.8 3.8 3.9 3.7 4.2 4.1  CL  --  101 102 99  --   --   --  101 103  CO2  --  31 34* 33*  --   --   --  32 31  GLUCOSE  --  347* 169* 100*  --   --   --  117* 137*  BUN  --  19 21* 19  --   --   --  24* 22*  CREATININE  --  1.48* 1.56* 1.65*  --   --   --  1.88* 1.72*  CALCIUM  --  8.1* 8.3* 8.1*  --   --   --  8.2* 8.0*  MG 2.0 1.7 1.9 1.8  --   --   --   --   --   PHOS  --  4.4 4.2  --   --   --   --   --   --    GFR: Estimated Creatinine Clearance: 47.4 mL/min (A) (by C-G formula based on SCr of 1.72 mg/dL (H)). Liver  Function Tests: Recent Labs  Lab 03/09/21 0303 03/10/21 0351  ALBUMIN 2.1* 2.1*   No results for input(s): LIPASE, AMYLASE in the last 168 hours. Recent Labs  Lab 03/12/21 1532  AMMONIA 31   Coagulation Profile: No results for input(s): INR, PROTIME in the last 168 hours. Cardiac Enzymes: No results for input(s): CKTOTAL, CKMB, CKMBINDEX, TROPONINI in the last 168 hours. BNP (last 3 results) Recent Labs    02/03/21 0911  PROBNP 3,012.0*   HbA1C: No results for input(s): HGBA1C in the last 72 hours. CBG: Recent Labs  Lab 03/12/21 1106 03/12/21 1701 03/12/21 2052 03/13/21  0601 03/13/21 1209  GLUCAP 259* 207* 184* 126* 219*   Lipid Profile: Recent Labs    03/13/21 0312  CHOL 138  HDL 40*  LDLCALC 86  TRIG 60  CHOLHDL 3.5   Thyroid Function Tests: No results for input(s): TSH, T4TOTAL, FREET4, T3FREE, THYROIDAB in the last 72 hours. Anemia Panel: Recent Labs    03/12/21 1532  VITAMINB12 558  FOLATE 11.4   Urine analysis:    Component Value Date/Time   COLORURINE YELLOW 03/08/2021 0246   APPEARANCEUR HAZY (A) 03/08/2021 0246   LABSPEC 1.019 03/08/2021 0246   PHURINE 5.0 03/08/2021 0246   GLUCOSEU 150 (A) 03/08/2021 0246   HGBUR NEGATIVE 03/08/2021 0246   BILIRUBINUR NEGATIVE 03/08/2021 0246   BILIRUBINUR negative 01/02/2021 1312   KETONESUR NEGATIVE 03/08/2021 0246   PROTEINUR >=300 (A) 03/08/2021 0246   UROBILINOGEN 0.2 01/02/2021 1312   NITRITE NEGATIVE 03/08/2021 0246   LEUKOCYTESUR NEGATIVE 03/08/2021 0246     Preet Perrier M.D. [facility] 03/13/2021, 3:19 PM  Available via Epic secure chat 7am-7pm After 7 pm, please refer to night coverage provider listed on amion.

## 2021-03-13 NOTE — Progress Notes (Signed)
Occupational Therapy Treatment Patient Details Name: Thomas West MRN: [id number] DOB: Feb 07, 1961 Today's Date: 03/13/2021   History of present illness Omair Dettmer is a 60 y.o. male who presented 03/08/21 with progressive edema, orthopnea and dyspnea on exertion for about 2 weeks. S/p cardiac cath 10/19. Pt with change in status 10/20 wtih Code Stroke called but CT negative. Possible acute encephalopathy. Medical history significant of HFrEF, prior MI, DM2, HTN. Pt presents to ED with 2+ weeks of worsening peripheral edema, anasarca.   OT comments  Pt more alert with improved ability to follow directions. However, pt with poor insight into problem solving and sequencing of ADLs. Pt required Max A for posterior hygiene after unknowingly having BM in bed. Pt did initiate to walk to sink for cleanup, but noted to get "stuck" wetting and wringing out a washcloth. Despite cues to wash posterior region, pt began washing face. Pt also required increased physical assist for mobility than initial OT eval, Min A with RW this PM. Based on noted decline, updated recs for CIR as pt previously very independent.    Recommendations for follow up therapy are one component of a multi-disciplinary discharge planning process, led by the attending physician.  Recommendations may be updated based on patient status, additional functional criteria and insurance authorization.    Follow Up Recommendations  CIR;Other (comment) (HHOT if unable to qualify for CIR)    Equipment Recommendations  None recommended by OT    Recommendations for Other Services  (will need HH aide or RN to assist with meds if pt discharges home)    Precautions / Restrictions Precautions Precautions: Fall Precaution Comments: bowel incontinence (reports this is new) Restrictions Weight Bearing Restrictions: No       Mobility Bed Mobility Overal bed mobility: Needs Assistance Bed Mobility: Supine to Sit;Sit to Supine     Supine to  sit: HOB elevated;Supervision Sit to supine: HOB elevated;Supervision   General bed mobility comments: supervision with cues to initiate and cues for line mgmt    Transfers Overall transfer level: Needs assistance Equipment used: Rolling walker (2 wheeled) Transfers: Sit to/from Stand Sit to Stand: Min guard;From elevated surface Stand pivot transfers: Min guard       General transfer comment: min guard to stand from elevated bed, increased time and cueing to scoot forward needed. Pt requires Min A to manuever RW due to slower processing    Balance Overall balance assessment: Needs assistance Sitting-balance support: Feet supported Sitting balance-Leahy Scale: Good Sitting balance - Comments: Static sitting EOB with supervision.   Standing balance support: Bilateral upper extremity supported;During functional activity Standing balance-Leahy Scale: Poor Standing balance comment: reliant on UE support in standing at this time                           ADL either performed or assessed with clinical judgement   ADL Overall ADL's : Needs assistance/impaired     Grooming: Supervision/safety;Standing;Wash/dry face Grooming Details (indicate cue type and reason): was actually cueing pt to assist with posterior hygiene when he began washing his face     Lower Body Bathing: Maximal assistance;Sit to/from stand Lower Body Bathing Details (indicate cue type and reason): Max A for posterior hygiene due to bowel incontinence (reports this is new for him). Cued pt repeatedly to assist and reach to wipe though unable to sequence (began to wet hands at sink, picked up washcloth when cued and was able to wring out excess water  when cued but continued to do this rather than attempt to wipe bottom) Upper Body Dressing : Minimal assistance;Sitting Upper Body Dressing Details (indicate cue type and reason): to guide hands into sleeves. did show initiation to untie gown to remove dirty  gown                 Functional mobility during ADLs: Minimal assistance;Rolling walker;Cueing for sequencing;Cueing for safety General ADL Comments: Pt with improving alertness, following of directions though impaired cognition still evident as well as increased physical assist to complete tasks.     Vision   Vision Assessment?: No apparent visual deficits   Perception     Praxis      Cognition Arousal/Alertness: Awake/alert Behavior During Therapy: Flat affect Overall Cognitive Status: Impaired/Different from baseline Area of Impairment: Orientation;Attention;Memory;Following commands;Awareness;Safety/judgement;Problem solving                 Orientation Level: Disoriented to;Place;Time;Situation Current Attention Level: Sustained Memory: Decreased short-term memory Following Commands: Follows one step commands with increased time;Follows multi-step commands inconsistently;Follows one step commands inconsistently Safety/Judgement: Decreased awareness of safety;Decreased awareness of deficits Awareness: Emergent Problem Solving: Slow processing;Decreased initiation;Difficulty sequencing;Requires verbal cues;Requires tactile cues General Comments: pt able to recall birthday during session but could not recall his son's birthday. Pt with slow processing, decreased awareness of needs and difficulty problem solving/sequencing basic ADLs        Exercises     Shoulder Instructions       General Comments Sister reporting she can provide 24/7 supervision but has 3 young children of her own she cares for, reports she is trying to set up getting a RN for the home. Pressure wounds on heels noted, notified RN, floated pt's heels end of session; educated them on delirium precautions/management    Pertinent Vitals/ Pain       Pain Assessment: No/denies pain Faces Pain Scale: Hurts little more Pain Location: bil heels Pain Descriptors / Indicators:  Discomfort;Grimacing;Guarding Pain Intervention(s): Monitored during session  Home Living                                          Prior Functioning/Environment              Frequency  Min 2X/week        Progress Toward Goals  OT Goals(current goals can now be found in the care plan section)  Progress towards OT goals: Not progressing toward goals - comment  Acute Rehab OT Goals Patient Stated Goal: to go home OT Goal Formulation: With patient Time For Goal Achievement: 03/25/21 Potential to Achieve Goals: Good ADL Goals Pt Will Transfer to Toilet: with modified independence;ambulating Additional ADL Goal #1: Pt to increase standing activity tolerance to > 10 min to improve endurance for ADLs/IADLs Additional ADL Goal #2: Pt to verbalize at least 3 strategies to minimize risk of CHF exacerbation Additional ADL Goal #3: Pt to complete pill box test with 0 errors  Plan Discharge plan needs to be updated    Co-evaluation                 AM-PAC OT "6 Clicks" Daily Activity     Outcome Measure   Help from another person eating meals?: None Help from another person taking care of personal grooming?: A Little Help from another person toileting, which includes using toliet, bedpan, or urinal?: A Lot Help  from another person bathing (including washing, rinsing, drying)?: A Lot Help from another person to put on and taking off regular upper body clothing?: A Little Help from another person to put on and taking off regular lower body clothing?: A Lot 6 Click Score: 16    End of Session Equipment Utilized During Treatment: Gait belt;Rolling walker  OT Visit Diagnosis: Unsteadiness on feet (R26.81);Other abnormalities of gait and mobility (R26.89);Muscle weakness (generalized) (M62.81)   Activity Tolerance Other (comment) (limited by cognition)   Patient Left in bed;with call bell/phone within reach;with bed alarm set   Nurse Communication  Mobility status        Time: 1400-1417 OT Time Calculation (min): 17 min  Charges: OT General Charges $OT Visit: 1 Visit OT Treatments $Self Care/Home Management : 8-22 mins  Bradd Canary, OTR/L Acute Rehab Services Office: [phone]   Lorre Munroe 03/13/2021, 2:41 PM

## 2021-03-13 NOTE — TOC Benefit Eligibility Note (Signed)
Patient Product/process development scientist completed.    The patient is currently admitted and upon discharge could be taking Rybelsus 3 mg tablets.  The current 30 day co-pay is, $47.00.   The patient is insured through [employer]     Roland Earl, CPhT Pharmacy Patient Advocate Specialist [facility] Antimicrobial Stewardship Team Direct Number: [phone]  Fax: [phone]

## 2021-03-13 NOTE — Progress Notes (Signed)
IP rehab admissions - I met with patient at the bedside.  He is a bit confused today.  He did give me permission to talk with his sister.  I called his sister and she prefers in hospital rehab on CIR if possible.  I have opened the case and have requested acute inpatient rehab admission.  I will update all once I hear back from insurance case manager.  Call for questions.   [phone]

## 2021-03-13 NOTE — Progress Notes (Signed)
  Mobility Specialist Criteria Algorithm Info.  Mobility Team: HOB elevated: Activity: Ambulated in hall Range of motion: Active; All extremities Level of assistance: Contact guard assist, steadying assist Assistive device: Front wheel walker Minutes sitting in chair:  Minutes stood:  Minutes ambulated:  Distance ambulated (ft): 110 ft Mobility response: Tolerated well Bed Position: Chair  Patient received lying supine in bed, agreed to participate in mobility. Pt slow processing with minimal communication but answering questions appropriately with either "yes" or "no". Ambulated in hallway with very slow but steady gait, balance is better when using RW. Required standing rest breaks x4 denied any fatigue, dizziness, or SOB. Returned to room and sat in recliner chair. Tolerated ambulation well without complaint or incident and was left sitting in recliner chair with all needs met.    03/13/2021 3:03 PM

## 2021-03-13 NOTE — Progress Notes (Signed)
Progress Note  Patient Name: Thomas West Date of Encounter: 03/13/2021  [facility] Cardiologist: Charlton Haws, MD    Subjective   No chest pain breathing improved ready for d/c   Inpatient Medications    Scheduled Meds:  aspirin EC  81 mg Oral Daily   atorvastatin  80 mg Oral Daily   carvedilol  25 mg Oral BID   clopidogrel  75 mg Oral Daily   docusate sodium  100 mg Oral Q12H   empagliflozin  10 mg Oral QAC breakfast   enoxaparin (LOVENOX) injection  40 mg Subcutaneous Q24H   furosemide  40 mg Oral Daily   hydrALAZINE  50 mg Oral TID   insulin aspart  0-15 Units Subcutaneous TID WC   insulin glargine-yfgn  15 Units Subcutaneous Daily   isosorbide mononitrate  60 mg Oral Daily   polyethylene glycol  17 g Oral Daily   sodium chloride flush  3 mL Intravenous Q12H   Continuous Infusions:  sodium chloride     PRN Meds: sodium chloride, acetaminophen, acetaminophen, ondansetron (ZOFRAN) IV, ondansetron (ZOFRAN) IV, sodium chloride flush   Vital Signs    Vitals:   03/12/21 2300 03/12/21 2316 03/13/21 0446 03/13/21 1147  BP:  122/79 134/90 127/61  Pulse:   73 72  Resp: (!) 24 20 (!) 24 16  Temp:  98.4 F (36.9 C) 98.6 F (37 C) 98.3 F (36.8 C)  TempSrc:  Oral Oral Oral  SpO2:  93% 98% 96%  Weight:   72.5 kg   Height:        Intake/Output Summary (Last 24 hours) at 03/13/2021 1321 Last data filed at 03/13/2021 0449 Gross per 24 hour  Intake 840 ml  Output 1770 ml  Net -930 ml   Last 3 Weights 03/13/2021 03/12/2021 03/11/2021  Weight (lbs) 159 lb 12.8 oz 157 lb 11.2 oz 166 lb 3.2 oz  Weight (kg) 72.485 kg 71.532 kg 75.388 kg      Telemetry    NSR  - Personally Reviewed  ECG    NSR  lateral T wave inversions  - Personally Reviewed  Physical Exam  Chronically ill black male  GEN: No acute distress.   Neck: No JVD Cardiac: RRR, no murmurs, rubs, or gallops.  Respiratory: Clear to auscultation bilaterally. GI: Soft, nontender, non-distended   MS: No edema; No deformity. Neuro:  Nonfocal  Psych: Normal affect   Labs    High Sensitivity Troponin:   Recent Labs  Lab 03/08/21 0258 03/08/21 0527  TROPONINIHS 20* 22*     Chemistry Recent Labs  Lab 03/09/21 0303 03/10/21 0351 03/11/21 0406 03/11/21 0959 03/11/21 1008 03/12/21 0355 03/13/21 0312  NA 139 144 143   < > 144 142 140  K 4.4 4.0 3.8   < > 3.7 4.2 4.1  CL 101 102 99  --   --  101 103  CO2 31 34* 33*  --   --  32 31  GLUCOSE 347* 169* 100*  --   --  117* 137*  BUN 19 21* 19  --   --  24* 22*  CREATININE 1.48* 1.56* 1.65*  --   --  1.88* 1.72*  CALCIUM 8.1* 8.3* 8.1*  --   --  8.2* 8.0*  MG 1.7 1.9 1.8  --   --   --   --   ALBUMIN 2.1* 2.1*  --   --   --   --   --   GFRNONAA 55* 51*  48*  --   --  41* 46*  ANIONGAP 7 8 11   --   --  9 6   < > = values in this interval not displayed.    Lipids  Recent Labs  Lab 03/13/21 0312  CHOL 138  TRIG 60  HDL 40*  LDLCALC 86  CHOLHDL 3.5    Hematology Recent Labs  Lab 03/09/21 0303 03/10/21 0351 03/11/21 0406 03/11/21 0959 03/11/21 1006 03/11/21 1008  WBC 5.8 6.7 7.2  --   --   --   RBC 4.51 4.74 4.57  --   --   --   HGB 12.7* 13.2 12.7* 13.3 13.6 13.3  HCT 39.1 39.8 38.7* 39.0 40.0 39.0  MCV 86.7 84.0 84.7  --   --   --   MCH 28.2 27.8 27.8  --   --   --   MCHC 32.5 33.2 32.8  --   --   --   RDW 13.8 13.8 14.0  --   --   --   PLT 239 267 267  --   --   --    Thyroid No results for input(s): TSH, FREET4 in the last 168 hours.  BNP Recent Labs  Lab 03/08/21 0258 03/09/21 0304  BNP 3,620.7* 2,895.4*    DDimer No results for input(s): DDIMER in the last 168 hours.   Radiology    CT HEAD CODE STROKE WO CONTRAST  Result Date: 03/12/2021 CLINICAL DATA:  Code stroke.  Right-sided weakness EXAM: CT HEAD WITHOUT CONTRAST TECHNIQUE: Contiguous axial images were obtained from the base of the skull through the vertex without intravenous contrast. COMPARISON:  None. FINDINGS: Brain: There is no  acute intracranial hemorrhage, mass effect, or edema. Gray-white differentiation is preserved. Patchy low-density in the supratentorial white matter is nonspecific but probably reflects mild to moderate chronic microvascular ischemic changes. Subcentimeter hyperdensity along the left aspect of the tentorium (series 6, image 21) is most consistent with a small meningioma. Vascular: No hyperdense vessel. There is intracranial atherosclerotic calcification at the skull base. Skull: Unremarkable. Sinuses/Orbits: Patchy paranasal sinus mucosal thickening. Orbits are unremarkable. Other: Mastoid air cells are clear. Right posterior maxillary molar periapical lucency. ASPECTS (Alberta Stroke Program Early CT Score) - Ganglionic level infarction (caudate, lentiform nuclei, internal capsule, insula, M1-M3 cortex): 7 - Supraganglionic infarction (M4-M6 cortex): 3 Total score (0-10 with 10 being normal): 10 IMPRESSION: There is no acute intracranial hemorrhage or evidence of acute infarction. ASPECT score is 10. Chronic microvascular ischemic changes. Small subcentimeter meningioma of the tentorium. These results were communicated to Dr. 03/14/2021 at 3:08 pm on 03/12/2021 by text page via the [facility] messaging system. Electronically Signed   By: [facility] M.D.   On: 03/12/2021 15:11    Cardiac Studies   Echo:  EF 20-25%  Cath: Conclusion      Dist RCA lesion is 20% stenosed.   Prox LAD lesion is 30% stenosed.   Dist LAD lesion is 70% stenosed.   2nd Mrg lesion is 60% stenosed.   Previously placed RPAV stent (unknown type) is  widely patent.   LV end diastolic pressure is normal.   LV end diastolic pressure is normal.   Hemodynamic findings consistent with mild pulmonary hypertension.   1.  Mild obstructive coronary artery disease with patent RPLV stent. 2.  Normal cardiac output and index with mean right atrial pressure of 5 mmHg and mean wedge pressure of 5 mmHg.   Patient Profile  60 y.o. male no  medical f/u since moving to [place] over a year ago not compliant with meds Cocaine/THC abuse admitted with acute systolic CHF and chest Pain   Assessment & Plan    CAD:  stents to distal RCA/PLB patent by cath 03/11/20 medical Rx moderate distal LAD and OM2 residual dx CHF:  EF 20-25% no meds prior to admission Insurance and cost an issue Diuresed well Continue coreg lasix hydralazine and nitrates [facility] will sign off.   Medication Recommendations:  See above  Other recommendations (labs, testing, etc):  BMET in 2-3 weeks  Follow up as an outpatient:  TOC f/u in CHF clinic   For questions or updates, please contact [facility] Please consult [email or web address] for contact info under        Signed, Charlton Haws, MD  03/13/2021, 1:21 PM

## 2021-03-13 NOTE — Care Management Important Message (Signed)
Important Message  Patient Details  Name: Thomas West MRN: [id number] Date of Birth: Oct 13, 1960   Medicare Important Message Given:  Yes     Renie Ora 03/13/2021, 10:41 AM

## 2021-03-13 NOTE — Progress Notes (Addendum)
Received message from central tele that pt had a 5 beat run of VT. Pt sitting in the chair eating dinner. Pt denies any symptoms. Dr. Isidoro Donning made aware.

## 2021-03-13 NOTE — Progress Notes (Signed)
Physical Therapy Treatment Patient Details Name: Thomas West MRN: [id number] DOB: [birth year] Today's Date: 03/13/2021   History of Present Illness Thomas West is a 60 y.o. male who presented 03/08/21 with progressive edema, orthopnea and dyspnea on exertion for about 2 weeks. S/p cardiac cath 10/19. Pt with change in status 10/20 wtih Code Stroke called but CT negative. Possible acute encephalopathy. Medical history significant of HFrEF, prior MI, DM2, HTN. Pt presents to ED with 2+ weeks of worsening peripheral edema, anasarca.    PT Comments    MD requesting PT to re-assess pt again today as pt appears to be less confused. Pt with noted improved balance, mobility, and cognitive status to yesterday, but still with slowed processing, difficulty following more than one step cues at a time, recalling his own birthday, and awareness into his safety. Educated pt and sister on delirium precautions/management. Pt continues to require min-modA to ambulate short household distances with 1-no UE support but is able to ambulate with min guard assist when using a RW. At baseline, he is independent without AD. Considering his significant change in status, ability to have 24/7 assistance from sister (but she has 3 young children she cares for also), young age, and risk for falls he could really benefit from intensive therapy in the CIR setting. If CIR is not an option then he could benefit from HHPT. Will continue to follow acutely.     Recommendations for follow up therapy are one component of a multi-disciplinary discharge planning process, led by the attending physician.  Recommendations may be updated based on patient status, additional functional criteria and insurance authorization.  Follow Up Recommendations  CIR;Supervision/Assistance - 24 hour (if not CIR then HHPT)     Equipment Recommendations  3in1 (PT)    Recommendations for Other Services       Precautions / Restrictions  Precautions Precautions: Fall Precaution Comments: bowel incontinence Restrictions Weight Bearing Restrictions: No     Mobility  Bed Mobility Overal bed mobility: Needs Assistance Bed Mobility: Supine to Sit     Supine to sit: HOB elevated;Supervision     General bed mobility comments: Supervision for safety, using bed rails with HOB elevated, extar time.    Transfers Overall transfer level: Needs assistance Equipment used: 1 person hand held assist;Rolling walker (2 wheeled) Transfers: Sit to/from [employer] Sit to Stand: Min assist Stand pivot transfers: Min guard       General transfer comment: Sit to stand from EOB with hands on bed with minA to steady. Min guard for stand step bed > recliner with RW.  Ambulation/Gait Ambulation/Gait assistance: Mod assist;Min guard;Min assist Gait Distance (Feet): 70 Feet (x2 bouts of ~70 ft > ~3 ft) Assistive device: 1 person hand held assist;None;Rolling walker (2 wheeled) Gait Pattern/deviations: Step-through pattern;Decreased step length - right;Decreased stride length;Staggering right;Decreased dorsiflexion - right;Decreased step length - left Gait velocity: reduced Gait velocity interpretation: <1.31 ft/sec, indicative of household ambulator General Gait Details: Pt continues to display decreased bil stride length and decreased R foot clearance. Pt with intermittent R lateral stagger when trying to ambulate without UE support or with only R HHA, up to modA to recover balance during LOB. When using RW he was able to maintain his balance with min guard assist, cues to remain proximal to RW.   Stairs             Wheelchair Mobility    Modified Rankin (Stroke Patients Only) Modified Rankin (Stroke Patients Only) Pre-Morbid Rankin Score: No  symptoms Modified Rankin: Moderately severe disability     Balance Overall balance assessment: Needs assistance Sitting-balance support: Feet supported Sitting  balance-Leahy Scale: Good Sitting balance - Comments: Static sitting EOB with supervision.   Standing balance support: No upper extremity supported;Bilateral upper extremity supported;Single extremity supported Standing balance-Leahy Scale: Poor Standing balance comment: Up to modA and benefits from UE support for standing.                            Cognition Arousal/Alertness: Awake/alert Behavior During Therapy: Flat affect Overall Cognitive Status: Impaired/Different from baseline Area of Impairment: Orientation;Attention;Memory;Following commands;Awareness;Safety/judgement;Problem solving                 Orientation Level: Disoriented to;Person;Place;Situation;Time Current Attention Level: Sustained Memory: Decreased short-term memory Following Commands: Follows one step commands with increased time;Follows one step commands consistently;Follows multi-step commands inconsistently Safety/Judgement: Decreased awareness of safety;Decreased awareness of deficits Awareness: Emergent Problem Solving: Slow processing;Decreased initiation;Difficulty sequencing;Requires verbal cues;Requires tactile cues General Comments: Pt still unable to recall his DOB, stating it was "[birth year]". Sister present confirming that this is not pt's norm. She reports he can easily recall his SS# at baseline. Pt with slow processing, but improved verbalization and ability to maintain attention to complete sentences this date. Able to follow simple commands consistently, but difficulty with multi-tasking.      Exercises      General Comments General comments (skin integrity, edema, etc.): Sister reporting she can provide 24/7 supervision but has 3 young children of her own she cares for, reports she is trying to set up getting a RN for the home. Pressure wounds on heels noted, notified RN, floated pt's heels end of session; educated them on delirium precautions/management      Pertinent  Vitals/Pain Pain Assessment: Faces Faces Pain Scale: Hurts little more Pain Location: bil heels Pain Descriptors / Indicators: Discomfort;Grimacing;Guarding Pain Intervention(s): Monitored during session;Limited activity within patient's tolerance;Repositioned;Other (comment) (pressure wounds noted, notified RN, floated heels)    Home Living                      Prior Function            PT Goals (current goals can now be found in the care plan section) Acute Rehab PT Goals Patient Stated Goal: to go home PT Goal Formulation: With patient/family Time For Goal Achievement: 03/23/21 Potential to Achieve Goals: Good Progress towards PT goals: Progressing toward goals    Frequency    Min 3X/week      PT Plan Current plan remains appropriate    Co-evaluation              AM-PAC PT "6 Clicks" Mobility   Outcome Measure  Help needed turning from your back to your side while in a flat bed without using bedrails?: A Little Help needed moving from lying on your back to sitting on the side of a flat bed without using bedrails?: A Little Help needed moving to and from a bed to a chair (including a wheelchair)?: A Little Help needed standing up from a chair using your arms (e.g., wheelchair or bedside chair)?: A Little Help needed to walk in hospital room?: A Lot Help needed climbing 3-5 steps with a railing? : A Lot 6 Click Score: 16    End of Session Equipment Utilized During Treatment: Gait belt Activity Tolerance: Patient tolerated treatment well Patient left: with call bell/phone within reach;in  chair;with chair alarm set;with family/visitor present Nurse Communication: Mobility status (pressure wounds on heels noted) PT Visit Diagnosis: Unsteadiness on feet (R26.81);Muscle weakness (generalized) (M62.81);Other abnormalities of gait and mobility (R26.89);Difficulty in walking, not elsewhere classified (R26.2)     Time: 5956-3875 PT Time Calculation (min)  (ACUTE ONLY): 35 min  Charges:  $Gait Training: 8-22 mins $Therapeutic Activity: 8-22 mins                     Raymond Gurney, PT, DPT Acute Rehabilitation Services  Pager: [phone] Office: [phone]    Jewel Baize 03/13/2021, 10:58 AM

## 2021-03-14 DIAGNOSIS — I5023 Acute on chronic systolic (congestive) heart failure: Secondary | ICD-10-CM | POA: Diagnosis not present

## 2021-03-14 DIAGNOSIS — I509 Heart failure, unspecified: Secondary | ICD-10-CM | POA: Diagnosis not present

## 2021-03-14 DIAGNOSIS — N182 Chronic kidney disease, stage 2 (mild): Secondary | ICD-10-CM | POA: Diagnosis not present

## 2021-03-14 LAB — GLUCOSE, CAPILLARY
Glucose-Capillary: 179 mg/dL — ABNORMAL HIGH (ref 70–99)
Glucose-Capillary: 265 mg/dL — ABNORMAL HIGH (ref 70–99)
Glucose-Capillary: 159 mg/dL — ABNORMAL HIGH (ref 70–99)
Glucose-Capillary: 213 mg/dL — ABNORMAL HIGH (ref 70–99)

## 2021-03-14 LAB — VITAMIN B1: Vitamin B1 (Thiamine): 113.2 nmol/L (ref 66.5–200.0)

## 2021-03-14 MED ORDER — INSULIN ASPART 100 UNIT/ML IJ SOLN
3.0000 [IU] | Freq: Three times a day (TID) | INTRAMUSCULAR | Status: DC
  Administered 2021-03-14 – 2021-03-17 (×9): 3 [IU] via SUBCUTANEOUS

## 2021-03-14 NOTE — Progress Notes (Signed)
Physical Therapy Treatment Patient Details Name: Thomas West MRN: [id number] DOB: [birth year] Today's Date: 03/14/2021   History of Present Illness Thomas West is a 60 y.o. male who presented 03/08/21 with progressive edema, orthopnea and dyspnea on exertion for about 2 weeks. S/p cardiac cath 10/19. Pt with change in status 10/20 wtih Code Stroke called but CT negative. Possible acute encephalopathy. Medical history significant of HFrEF, prior MI, DM2, HTN. Pt presents to ED with 2+ weeks of worsening peripheral edema, anasarca.    PT Comments    Pt supine in bed and agreeable to PT session.  Pt continues to be motivated but limited due to balance and strength deficits.  Pt required trip to [facility] [place] after gt session which ended session.  Will continue to recommend aggressive rehab in a post acute setting.     Recommendations for follow up therapy are one component of a multi-disciplinary discharge planning process, led by the attending physician.  Recommendations may be updated based on patient status, additional functional criteria and insurance authorization.  Follow Up Recommendations  CIR;Supervision/Assistance - 24 hour (If not CIR then HHPT.)     Equipment Recommendations  3in1 (PT)    Recommendations for Other Services       Precautions / Restrictions Precautions Precautions: Fall Precaution Comments: bowel incontinence (reports this is new) Restrictions Weight Bearing Restrictions: No     Mobility  Bed Mobility Overal bed mobility: Needs Assistance Bed Mobility: Supine to Sit;Sit to Supine     Supine to sit: HOB elevated;Supervision Sit to supine: HOB elevated;Supervision   General bed mobility comments: supervision with cues to initiate and cues for line mgmt   Mod assistance once sitting edge of bed to scoot to edge of bed.   Transfers Overall transfer level: Needs assistance Equipment used: Rolling walker (2 wheeled) Transfers: Sit to/from Stand Sit to Stand:  From elevated surface;Min assist         General transfer comment: min guard to stand from elevated bed, increased time and cueing to scoot forward needed. Pt requires Min A to manuever RW due to slower processing  Ambulation/Gait Ambulation/Gait assistance: Mod assist Gait Distance (Feet): 30 Feet Assistive device: Rolling walker (2 wheeled) Gait Pattern/deviations: Step-through pattern;Decreased step length - right;Decreased stride length;Staggering right;Decreased dorsiflexion - right;Decreased step length - left Gait velocity: reduced   General Gait Details: Pt continues to display decreased bil stride length and decreased R foot clearance due to pain in L heel. Required RW with assistance to keep device close to him.   Stairs             Wheelchair Mobility    Modified Rankin (Stroke Patients Only)       Balance Overall balance assessment: Needs assistance Sitting-balance support: Feet supported Sitting balance-Leahy Scale: Good Sitting balance - Comments: Static sitting EOB with supervision.     Standing balance-Leahy Scale: Poor Standing balance comment: reliant on UE support in standing at this time                            Cognition Arousal/Alertness: Awake/alert Behavior During Therapy: Flat affect Overall Cognitive Status: Impaired/Different from baseline Area of Impairment: Orientation;Attention;Memory;Following commands;Awareness;Safety/judgement;Problem solving                 Orientation Level: Disoriented to;Place;Time;Situation Current Attention Level: Sustained Memory: Decreased short-term memory Following Commands: Follows one step commands with increased time;Follows multi-step commands inconsistently;Follows one step commands inconsistently Safety/Judgement: Decreased awareness  of safety;Decreased awareness of deficits Awareness: Emergent Problem Solving: Slow processing;Decreased initiation;Difficulty sequencing;Requires  verbal cues;Requires tactile cues General Comments: pt able to recall birthday during session but could not recall his son's birthday. Pt with slow processing, decreased awareness of needs and difficulty problem solving/sequencing basic ADLs      Exercises      General Comments        Pertinent Vitals/Pain Pain Assessment: No/denies pain Faces Pain Scale: Hurts little more Pain Location: L heel Pain Descriptors / Indicators: Discomfort;Grimacing;Guarding Pain Intervention(s): Monitored during session;Repositioned    Home Living                      Prior Function            PT Goals (current goals can now be found in the care plan section) Acute Rehab PT Goals Patient Stated Goal: to go home Potential to Achieve Goals: Good Progress towards PT goals: Progressing toward goals    Frequency    Min 3X/week      PT Plan Current plan remains appropriate    Co-evaluation              AM-PAC PT "6 Clicks" Mobility   Outcome Measure  Help needed turning from your back to your side while in a flat bed without using bedrails?: A Little Help needed moving from lying on your back to sitting on the side of a flat bed without using bedrails?: A Little Help needed moving to and from a bed to a chair (including a wheelchair)?: A Little Help needed standing up from a chair using your arms (e.g., wheelchair or bedside chair)?: A Little Help needed to walk in hospital room?: A Lot Help needed climbing 3-5 steps with a railing? : A Lot 6 Click Score: 16    End of Session Equipment Utilized During Treatment: Gait belt Activity Tolerance: Patient tolerated treatment well Patient left: with nursing/sitter in room (with NT sitting on commode attempting to have a BM.) Nurse Communication: Mobility status PT Visit Diagnosis: Unsteadiness on feet (R26.81);Muscle weakness (generalized) (M62.81);Other abnormalities of gait and mobility (R26.89);Difficulty in walking, not  elsewhere classified (R26.2)     Time: 8657-8469 PT Time Calculation (min) (ACUTE ONLY): 14 min  Charges:  $Gait Training: 8-22 mins                     Bonney Leitz , PTA Acute Rehabilitation Services Pager [phone] Office [phone]    Cezar Misiaszek Artis Delay 03/14/2021, 2:13 PM

## 2021-03-14 NOTE — Progress Notes (Addendum)
[facility]                                                                              Patient Demographics  Thomas West, is a 60 y.o. male, DOB - 06-Jan-1961, JJH:[id number]  Admit date - 03/08/2021   Admitting Physician Almon Hercules, MD  Outpatient Primary MD for the patient is [name], [name] Expose, MD  Outpatient specialists:   LOS - 5  days   Medical records reviewed and are as summarized below:    Chief Complaint  Patient presents with   Shortness of Breath       Brief summary   60 year old male with history of systolic CHF, CAD, DM-2, HTN, CVA and CKD-3A presenting with progressive edema, orthopnea and dyspnea on exertion for about 2 weeks.  Patient moved from [place] to [place] about a year ago.  He has not established care with PCP, and has not been able to fill his medications.  He is not sure about taking diuretics.  He also admits to taking ibuprofen twice daily.  Hypertensive to 180/120s.  BNP elevated to 3600.  Admitted for acute on chronic systolic CHF with marked anasarca, and started on IV Lasix.    Assessment & Plan    Principal Problem: Acute metabolic encephalopathy with dizziness, gait instability, -Alert and oriented, appears to be at his baseline.  Work-up negative.  CT head showed no acute stroke. -PT evaluation recommended CIR -Neurontin discontinued, B12, folate, B1, ammonia level normal.   -Creatinine improving, 1.7     Acute on chronic combined systolic and diastolic CHF -Patient presented with progressive edema, orthopnea, dyspnea on exertion, volume overload, elevated BNP -Patient was placed on IV Lasix 60 mg twice daily, negative balance of 12.9 L -2D echo showed EF of 20 to 25% with severely decreased LV function, global hypokinesis, grade 2DD -Underwent cardiac cath on 10/19, mild obstructive CAD with patent RPLV stent -Entresto held due to renal insufficiency, continue oral Lasix 40 mg daily   Hypertensive  urgency -BP stable, continue Coreg, hydralazine, Imdur, Lasix   History of MI (myocardial infarction), CAD -Has been noncompliant,  -Continue aspirin, Plavix, statin   Type 2 diabetes mellitus with diabetic neuropathy, unspecified (HCC) -Uncontrolled, hemoglobin A1c> 14.0 -Continue sliding scale insulin, continue Semglee 15 units daily, NovoLog 3 units 3 times daily AC -At the time of discharge will place on metformin XL, glipizide XL and semaglutide 3 mg  PO daily  AKI on CKD stage IIIa -Baseline creatinine 1.4-1.5 -Creatinine trended up to 1.8 likely due to diuresis, Entresto  -Creatinine improving, closer to baseline  Code Status: Full  DVT Prophylaxis:  Place TED hose Start: 03/12/21 1113 Place and maintain sequential compression device Start: 03/09/21 0903 enoxaparin (LOVENOX) injection 40 mg Start: 03/08/21 1000   Level of Care: Level of care: Telemetry Cardiac Family Communication: Discussed all imaging results, lab results, explained to the patient    Disposition Plan:     Status is: Inpatient  Remains inpatient appropriate because: Need for CIR   Time Spent in minutes 25 minutes  Procedures:  2D echo  Consultants:   Cardiology  Antimicrobials:   Anti-infectives (From admission, onward)    None          Medications  Scheduled Meds:  aspirin EC  81 mg Oral Daily   atorvastatin  80 mg Oral Daily   carvedilol  25 mg Oral BID   clopidogrel  75 mg Oral Daily   docusate sodium  100 mg Oral Q12H   empagliflozin  10 mg Oral QAC breakfast   enoxaparin (LOVENOX) injection  40 mg Subcutaneous Q24H   furosemide  40 mg Oral Daily   hydrALAZINE  50 mg Oral TID   insulin aspart  0-15 Units Subcutaneous TID WC   insulin glargine-yfgn  15 Units Subcutaneous Daily   isosorbide mononitrate  60 mg Oral Daily   polyethylene glycol  17 g Oral Daily   sodium chloride flush  3 mL Intravenous Q12H   Continuous Infusions:  sodium chloride     PRN Meds:.sodium  chloride, acetaminophen, acetaminophen, ondansetron (ZOFRAN) IV, ondansetron (ZOFRAN) IV, sodium chloride flush      Subjective:   Thomas West was seen and examined today.  Alert and oriented, close to his baseline.  No acute issues overnight.  Awaiting CIR.    Objective:   Vitals:   03/13/21 1147 03/13/21 1548 03/13/21 2010 03/14/21 0402  BP: 127/61 121/66 121/87 (!) 146/82  Pulse: 72   70  Resp: 16  14 (!) 24  Temp: 98.3 F (36.8 C)  98.8 F (37.1 C) 98.2 F (36.8 C)  TempSrc: Oral  Oral Oral  SpO2: 96%  96% 97%  Weight:    70.9 kg  Height:        Intake/Output Summary (Last 24 hours) at 03/14/2021 1310 Last data filed at 03/14/2021 9678 Gross per 24 hour  Intake 840 ml  Output 1925 ml  Net -1085 ml     Wt Readings from Last 3 Encounters:  03/14/21 70.9 kg  02/03/21 73 kg  03/27/19 79.7 kg   Physical Exam General: Alert and oriented x 3, NAD Cardiovascular: S1 S2 clear, RRR. No pedal edema b/l Respiratory: CTA B, no wheezing Gastrointestinal: Soft, nontender, nondistended, NBS Ext: no pedal edema bilaterally    Data Reviewed:  I have personally reviewed following labs and imaging studies  Micro Results Recent Results (from the past 240 hour(s))  Resp Panel by RT-PCR (Flu A&B, Covid) Nasopharyngeal Swab     Status: None   Collection Time: 03/08/21  6:48 AM   Specimen: Nasopharyngeal Swab; Nasopharyngeal(NP) swabs in vial transport medium  Result Value Ref Range Status   SARS Coronavirus 2 by RT PCR NEGATIVE NEGATIVE Final    Comment: (NOTE) SARS-CoV-2 target nucleic acids are NOT DETECTED.  The SARS-CoV-2 RNA is generally detectable in upper respiratory specimens during the acute phase of infection. The lowest concentration of SARS-CoV-2 viral copies this assay can detect is 138 copies/mL. A negative result does not preclude SARS-Cov-2 infection and should not be used as the sole basis for treatment or other patient management decisions. A  negative result may occur with  improper specimen collection/handling, submission of specimen other than nasopharyngeal swab, presence of viral mutation(s) within the areas targeted by this assay, and inadequate number of viral copies(<138 copies/mL). A negative result must be combined with clinical observations, patient history, and epidemiological information. The expected result is Negative.  Fact Sheet for Patients:  BloggerCourse.com  Fact Sheet for Healthcare Providers:  SeriousBroker.it  This test is no t yet approved or cleared by the [place] FDA  and  has been authorized for detection and/or diagnosis of SARS-CoV-2 by FDA under an Emergency Use Authorization (EUA). This EUA will remain  in effect (meaning this test can be used) for the duration of the COVID-19 declaration under Section 564(b)(1) of the Act, 21 U.S.C.section 360bbb-3(b)(1), unless the authorization is terminated  or revoked sooner.       Influenza A by PCR NEGATIVE NEGATIVE Final   Influenza B by PCR NEGATIVE NEGATIVE Final    Comment: (NOTE) The Xpert Xpress SARS-CoV-2/FLU/RSV plus assay is intended as an aid in the diagnosis of influenza from Nasopharyngeal swab specimens and should not be used as a sole basis for treatment. Nasal washings and aspirates are unacceptable for Xpert Xpress SARS-CoV-2/FLU/RSV testing.  Fact Sheet for Patients: BloggerCourse.com  Fact Sheet for Healthcare Providers: SeriousBroker.it  This test is not yet approved or cleared by the [place] FDA and has been authorized for detection and/or diagnosis of SARS-CoV-2 by FDA under an Emergency Use Authorization (EUA). This EUA will remain in effect (meaning this test can be used) for the duration of the COVID-19 declaration under Section 564(b)(1) of the Act, 21 U.S.C. section 360bbb-3(b)(1), unless the authorization  is terminated or revoked.  Performed at [facility], [address], [place], [place] [address]     Radiology Reports DG Chest 2 View  Result Date: 03/08/2021 CLINICAL DATA:  Shortness of breath EXAM: CHEST - 2 VIEW COMPARISON:  02/06/2018 FINDINGS: Cardiac shadow is enlarged but stable. Aortic calcifications are seen. Small pleural effusions are noted bilaterally. Focal airspace opacity is noted in the medial right lung base projecting in the right lower lobe. No bony abnormality is noted. IMPRESSION: Right lower lobe infiltrate with small pleural effusions bilaterally. Electronically Signed   By: Alcide Clever M.D.   On: 03/08/2021 03:31   CARDIAC CATHETERIZATION  Result Date: 03/11/2021   Dist RCA lesion is 20% stenosed.   Prox LAD lesion is 30% stenosed.   Dist LAD lesion is 70% stenosed.   2nd Mrg lesion is 60% stenosed.   Previously placed RPAV stent (unknown type) is  widely patent.   LV end diastolic pressure is normal.   LV end diastolic pressure is normal.   Hemodynamic findings consistent with mild pulmonary hypertension. 1.  Mild obstructive coronary artery disease with patent RPLV stent. 2.  Normal cardiac output and index with mean right atrial pressure of 5 mmHg and mean wedge pressure of 5 mmHg.   ECHOCARDIOGRAM COMPLETE  Result Date: 03/09/2021    ECHOCARDIOGRAM REPORT   Patient Name:   MARCIA LEPERA Date of Exam: 03/09/2021 Medical Rec #:  [id number]     Height:       69.0 in Accession #:    [id number]    Weight:       161.0 lb Date of Birth:  Oct 19, 1960     BSA:          1.884 m Patient Age:    59 years      BP:           115/98 mmHg Patient Gender: M             HR:           64 bpm. Exam Location:  Inpatient Procedure: 2D Echo, Cardiac Doppler and Color Doppler Indications:    CHF- Acute systolic  History:        Patient has no prior history of Echocardiogram examinations.  CHF, CAD and Acute MI, Stroke; Risk Factors:Diabetes and                  Hypertension.  Sonographer:    Gertie Fey MHA, RDMS, RVT, RDCS Referring Phys: [id number] JARED M GARDNER IMPRESSIONS  1. Left ventricular ejection fraction, by estimation, is 20 to 25%. The left ventricle has severely decreased function. The left ventricle demonstrates global hypokinesis. Left ventricular diastolic parameters are consistent with Grade II diastolic dysfunction (pseudonormalization).  2. Right ventricular systolic function is normal. The right ventricular size is mildly enlarged. There is normal pulmonary artery systolic pressure. The estimated right ventricular systolic pressure is 24.0 mmHg.  3. Right atrial size was mildly dilated.  4. The mitral valve is normal in structure. Trivial mitral valve regurgitation. No evidence of mitral stenosis.  5. Tricuspid valve regurgitation is moderate.  6. The aortic valve is tricuspid. Aortic valve regurgitation is not visualized. Mild aortic valve sclerosis is present, with no evidence of aortic valve stenosis.  7. The inferior vena cava is normal in size with greater than 50% respiratory variability, suggesting right atrial pressure of 3 mmHg. FINDINGS  Left Ventricle: Left ventricular ejection fraction, by estimation, is 20 to 25%. The left ventricle has severely decreased function. The left ventricle demonstrates global hypokinesis. The left ventricular internal cavity size was normal in size. There is no left ventricular hypertrophy. Left ventricular diastolic parameters are consistent with Grade II diastolic dysfunction (pseudonormalization). Right Ventricle: The right ventricular size is mildly enlarged. No increase in right ventricular wall thickness. Right ventricular systolic function is normal. There is normal pulmonary artery systolic pressure. The tricuspid regurgitant velocity is 2.29  m/s, and with an assumed right atrial pressure of 3 mmHg, the estimated right ventricular systolic pressure is 24.0 mmHg. Left Atrium: Left atrial size was  normal in size. Right Atrium: Right atrial size was mildly dilated. Pericardium: There is no evidence of pericardial effusion. Mitral Valve: The mitral valve is normal in structure. There is mild thickening of the mitral valve leaflet(s). There is mild calcification of the mitral valve leaflet(s). Trivial mitral valve regurgitation. No evidence of mitral valve stenosis. Tricuspid Valve: The tricuspid valve is normal in structure. Tricuspid valve regurgitation is moderate . No evidence of tricuspid stenosis. Aortic Valve: The aortic valve is tricuspid. Aortic valve regurgitation is not visualized. Mild aortic valve sclerosis is present, with no evidence of aortic valve stenosis. Aortic valve mean gradient measures 2.5 mmHg. Aortic valve peak gradient measures 4.8 mmHg. Aortic valve area, by VTI measures 1.58 cm. Pulmonic Valve: The pulmonic valve was normal in structure. Pulmonic valve regurgitation is not visualized. No evidence of pulmonic stenosis. Aorta: The aortic root is normal in size and structure. Venous: The inferior vena cava is normal in size with greater than 50% respiratory variability, suggesting right atrial pressure of 3 mmHg. IAS/Shunts: No atrial level shunt detected by color flow Doppler.  LEFT VENTRICLE PLAX 2D LVIDd:         4.90 cm      Diastology LVIDs:         4.50 cm      LV e' medial:    4.19 cm/s LV PW:         1.00 cm      LV E/e' medial:  11.3 LV IVS:        1.00 cm      LV e' lateral:   6.53 cm/s LVOT diam:     2.00 cm  LV E/e' lateral: 7.3 LV SV:         36 LV SV Index:   19 LVOT Area:     3.14 cm  LV Volumes (MOD) LV vol d, MOD A2C: 124.0 ml LV vol d, MOD A4C: 99.9 ml LV vol s, MOD A2C: 92.8 ml LV vol s, MOD A4C: 79.9 ml LV SV MOD A2C:     31.2 ml LV SV MOD A4C:     99.9 ml LV SV MOD BP:      25.6 ml RIGHT VENTRICLE RV S prime:     5.36 cm/s TAPSE (M-mode): 1.3 cm LEFT ATRIUM             Index        RIGHT ATRIUM           Index LA diam:        3.90 cm 2.07 cm/m   RA Area:      21.30 cm LA Vol (A2C):   51.5 ml 27.33 ml/m  RA Volume:   66.20 ml  35.14 ml/m LA Vol (A4C):   56.1 ml 29.78 ml/m LA Biplane Vol: 56.7 ml 30.09 ml/m  AORTIC VALVE AV Area (Vmax):    1.97 cm AV Area (Vmean):   1.83 cm AV Area (VTI):     1.58 cm AV Vmax:           109.00 cm/s AV Vmean:          72.500 cm/s AV VTI:            0.227 m AV Peak Grad:      4.8 mmHg AV Mean Grad:      2.5 mmHg LVOT Vmax:         68.40 cm/s LVOT Vmean:        42.200 cm/s LVOT VTI:          0.114 m LVOT/AV VTI ratio: 0.50  AORTA Ao Root diam: 3.00 cm MITRAL VALVE               TRICUSPID VALVE MV Area (PHT): 4.71 cm    TR Peak grad:   21.0 mmHg MV Decel Time: 161 msec    TR Vmax:        229.00 cm/s MV E velocity: 47.50 cm/s MV A velocity: 39.55 cm/s  SHUNTS MV E/A ratio:  1.20        Systemic VTI:  0.11 m                            Systemic Diam: 2.00 cm Donato Schultz MD Electronically signed by Donato Schultz MD Signature Date/Time: 03/09/2021/1:53:07 PM    Final    CT HEAD CODE STROKE WO CONTRAST  Result Date: 03/12/2021 CLINICAL DATA:  Code stroke.  Right-sided weakness EXAM: CT HEAD WITHOUT CONTRAST TECHNIQUE: Contiguous axial images were obtained from the base of the skull through the vertex without intravenous contrast. COMPARISON:  None. FINDINGS: Brain: There is no acute intracranial hemorrhage, mass effect, or edema. Gray-white differentiation is preserved. Patchy low-density in the supratentorial white matter is nonspecific but probably reflects mild to moderate chronic microvascular ischemic changes. Subcentimeter hyperdensity along the left aspect of the tentorium (series 6, image 21) is most consistent with a small meningioma. Vascular: No hyperdense vessel. There is intracranial atherosclerotic calcification at the skull base. Skull: Unremarkable. Sinuses/Orbits: Patchy paranasal sinus mucosal thickening. Orbits are unremarkable. Other: Mastoid air cells are clear. Right posterior  maxillary molar periapical lucency.  ASPECTS ([place] Stroke Program Early CT Score) - Ganglionic level infarction (caudate, lentiform nuclei, internal capsule, insula, M1-M3 cortex): 7 - Supraganglionic infarction (M4-M6 cortex): 3 Total score (0-10 with 10 being normal): 10 IMPRESSION: There is no acute intracranial hemorrhage or evidence of acute infarction. ASPECT score is 10. Chronic microvascular ischemic changes. Small subcentimeter meningioma of the tentorium. These results were communicated to Dr. Otelia Limes at 3:08 pm on 03/12/2021 by text page via the [facility] messaging system. Electronically Signed   By: Guadlupe Spanish M.D.   On: 03/12/2021 15:11    Lab Data:  CBC: Recent Labs  Lab 03/08/21 0258 03/08/21 0316 03/09/21 0303 03/10/21 0351 03/11/21 0406 03/11/21 0959 03/11/21 1006 03/11/21 1008  WBC 7.1  --  5.8 6.7 7.2  --   --   --   NEUTROABS 4.1  --   --   --   --   --   --   --   HGB 15.2   < > 12.7* 13.2 12.7* 13.3 13.6 13.3  HCT 48.4   < > 39.1 39.8 38.7* 39.0 40.0 39.0  MCV 88.2  --  86.7 84.0 84.7  --   --   --   PLT 310  --  239 267 267  --   --   --    < > = values in this interval not displayed.   Basic Metabolic Panel: Recent Labs  Lab 03/08/21 0527 03/09/21 0303 03/10/21 0351 03/11/21 0406 03/11/21 0959 03/11/21 1006 03/11/21 1008 03/12/21 0355 03/13/21 0312  NA  --  139 144 143 143 143 144 142 140  K  --  4.4 4.0 3.8 3.8 3.9 3.7 4.2 4.1  CL  --  101 102 99  --   --   --  101 103  CO2  --  31 34* 33*  --   --   --  32 31  GLUCOSE  --  347* 169* 100*  --   --   --  117* 137*  BUN  --  19 21* 19  --   --   --  24* 22*  CREATININE  --  1.48* 1.56* 1.65*  --   --   --  1.88* 1.72*  CALCIUM  --  8.1* 8.3* 8.1*  --   --   --  8.2* 8.0*  MG 2.0 1.7 1.9 1.8  --   --   --   --   --   PHOS  --  4.4 4.2  --   --   --   --   --   --    GFR: Estimated Creatinine Clearance: 47.4 mL/min (A) (by C-G formula based on SCr of 1.72 mg/dL (H)). Liver Function Tests: Recent Labs  Lab 03/09/21 0303  03/10/21 0351  ALBUMIN 2.1* 2.1*   No results for input(s): LIPASE, AMYLASE in the last 168 hours. Recent Labs  Lab 03/12/21 1532  AMMONIA 31   Coagulation Profile: No results for input(s): INR, PROTIME in the last 168 hours. Cardiac Enzymes: No results for input(s): CKTOTAL, CKMB, CKMBINDEX, TROPONINI in the last 168 hours. BNP (last 3 results) Recent Labs    02/03/21 0911  PROBNP 3,012.0*   HbA1C: No results for input(s): HGBA1C in the last 72 hours. CBG: Recent Labs  Lab 03/13/21 1209 03/13/21 1652 03/13/21 2117 03/14/21 0606 03/14/21 1151  GLUCAP 219* 171* 128* 179* 265*   Lipid Profile: Recent Labs    03/13/21  0312  CHOL 138  HDL 40*  LDLCALC 86  TRIG 60  CHOLHDL 3.5   Thyroid Function Tests: No results for input(s): TSH, T4TOTAL, FREET4, T3FREE, THYROIDAB in the last 72 hours. Anemia Panel: Recent Labs    03/12/21 1532  VITAMINB12 558  FOLATE 11.4   Urine analysis:    Component Value Date/Time   COLORURINE YELLOW 03/08/2021 0246   APPEARANCEUR HAZY (A) 03/08/2021 0246   LABSPEC 1.019 03/08/2021 0246   PHURINE 5.0 03/08/2021 0246   GLUCOSEU 150 (A) 03/08/2021 0246   HGBUR NEGATIVE 03/08/2021 0246   BILIRUBINUR NEGATIVE 03/08/2021 0246   BILIRUBINUR negative 01/02/2021 1312   KETONESUR NEGATIVE 03/08/2021 0246   PROTEINUR >=300 (A) 03/08/2021 0246   UROBILINOGEN 0.2 01/02/2021 1312   NITRITE NEGATIVE 03/08/2021 0246   LEUKOCYTESUR NEGATIVE 03/08/2021 0246     Briselda Naval M.D. [facility] 03/14/2021, 1:10 PM  Available via Epic secure chat 7am-7pm After 7 pm, please refer to night coverage provider listed on amion.

## 2021-03-15 DIAGNOSIS — I509 Heart failure, unspecified: Secondary | ICD-10-CM | POA: Diagnosis not present

## 2021-03-15 DIAGNOSIS — I5023 Acute on chronic systolic (congestive) heart failure: Secondary | ICD-10-CM | POA: Diagnosis not present

## 2021-03-15 DIAGNOSIS — N182 Chronic kidney disease, stage 2 (mild): Secondary | ICD-10-CM | POA: Diagnosis not present

## 2021-03-15 LAB — GLUCOSE, CAPILLARY
Glucose-Capillary: 105 mg/dL — ABNORMAL HIGH (ref 70–99)
Glucose-Capillary: 193 mg/dL — ABNORMAL HIGH (ref 70–99)
Glucose-Capillary: 99 mg/dL (ref 70–99)
Glucose-Capillary: 164 mg/dL — ABNORMAL HIGH (ref 70–99)

## 2021-03-15 NOTE — Progress Notes (Signed)
[facility]                                                                              Patient Demographics  Thomas West, is a 60 y.o. male, DOB - [birth year], DDU:[id number]  Admit date - 03/08/2021   Admitting Physician Almon Hercules, MD  Outpatient Primary MD for the patient is [name], [name] Expose, MD  Outpatient specialists:   LOS - 6  days   Medical records reviewed and are as summarized below:    Chief Complaint  Patient presents with   Shortness of Breath       Brief summary   60 year old male with history of systolic CHF, CAD, DM-2, HTN, CVA and CKD-3A presenting with progressive edema, orthopnea and dyspnea on exertion for about 2 weeks.  Patient moved from [place] to [place] about a year ago.  He has not established care with PCP, and has not been able to fill his medications.  He is not sure about taking diuretics.  He also admits to taking ibuprofen twice daily.  Hypertensive to 180/120s.  BNP elevated to 3600.  Admitted for acute on chronic systolic CHF with marked anasarca, and started on IV Lasix.    Assessment & Plan    Principal Problem: Acute metabolic encephalopathy with dizziness, gait instability, -Alert and oriented, appears to be at his baseline.  Work-up negative.  CT head showed no acute stroke. -Neurontin discontinued, B12, folate, B1, ammonia level normal.   -Renal function stable, improving -PT recommended CIR     Acute on chronic combined systolic and diastolic CHF -Patient presented with progressive edema, orthopnea, dyspnea on exertion, volume overload, elevated BNP -Initially placed on IV Lasix, now transitioned to oral Lasix 40 mg daily, negative balance of 13.9 L -2D echo showed EF of 20 to 25% with severely decreased LV function, global hypokinesis, grade 2DD -Underwent cardiac cath on 10/19, mild obstructive CAD with patent RPLV stent -Entresto held due to renal insufficiency, recheck labs in  a.m.   Hypertensive urgency -BP stable, continue Coreg, hydralazine, Imdur, Lasix   History of MI (myocardial infarction), CAD -Has been noncompliant,  -Continue aspirin, Plavix, statin   Type 2 diabetes mellitus with diabetic neuropathy, unspecified (HCC) -Uncontrolled, hemoglobin A1c> 14.0 -CBG stable, continue ssi, Semglee 15 units daily, NovoLog 3 units tid ac  -At the time of discharge will place on metformin XL, glipizide XL and semaglutide 3 mg  PO daily  AKI on CKD stage IIIa -Baseline creatinine 1.4-1.5 -Creatinine trended up to 1.8 likely due to diuresis, Entresto  -Recheck BMET in a.m.  Code Status: Full  DVT Prophylaxis:  Place TED hose Start: 03/12/21 1113 Place and maintain sequential compression device Start: 03/09/21 0903 enoxaparin (LOVENOX) injection 40 mg Start: 03/08/21 1000   Level of Care: Level of care: Telemetry Cardiac Family Communication: Discussed all imaging results, lab results, explained to the patient    Disposition Plan:     Status is: Inpatient  Remains inpatient appropriate because: CIR when bed available   Time Spent in minutes 25 minutes  Procedures:  2D echo  Consultants:   Cardiology   Antimicrobials:  Anti-infectives (From admission, onward)    None          Medications  Scheduled Meds:  aspirin EC  81 mg Oral Daily   atorvastatin  80 mg Oral Daily   carvedilol  25 mg Oral BID   clopidogrel  75 mg Oral Daily   docusate sodium  100 mg Oral Q12H   empagliflozin  10 mg Oral QAC breakfast   enoxaparin (LOVENOX) injection  40 mg Subcutaneous Q24H   furosemide  40 mg Oral Daily   hydrALAZINE  50 mg Oral TID   insulin aspart  0-15 Units Subcutaneous TID WC   insulin aspart  3 Units Subcutaneous TID WC   insulin glargine-yfgn  15 Units Subcutaneous Daily   isosorbide mononitrate  60 mg Oral Daily   polyethylene glycol  17 g Oral Daily   sodium chloride flush  3 mL Intravenous Q12H   Continuous Infusions:   sodium chloride     PRN Meds:.sodium chloride, acetaminophen, acetaminophen, ondansetron (ZOFRAN) IV, ondansetron (ZOFRAN) IV, sodium chloride flush      Subjective:   Thomas West was seen and examined today.  Alert and oriented, watching TV, appears to be at his baseline, waiting for CIR  Objective:   Vitals:   03/14/21 1309 03/14/21 1900 03/15/21 0420 03/15/21 1056  BP: 112/68 130/71 127/78 (!) 143/87  Pulse: 72  71 81  Resp: 20  18 16   Temp: 98.6 F (37 C) 98.6 F (37 C) 98.6 F (37 C) 98.4 F (36.9 C)  TempSrc: Oral Oral Oral Oral  SpO2: 95% 98% 95% 95%  Weight:   70.5 kg   Height:        Intake/Output Summary (Last 24 hours) at 03/15/2021 1345 Last data filed at 03/15/2021 0853 Gross per 24 hour  Intake 920 ml  Output 1025 ml  Net -105 ml     Wt Readings from Last 3 Encounters:  03/15/21 70.5 kg  02/03/21 73 kg  03/27/19 79.7 kg   Physical Exam General: Alert and oriented x 3, NAD Cardiovascular: S1 S2 clear, RRR. Respiratory: CTAB Gastrointestinal: Soft, nontender, nondistended, NBS Ext: no pedal edema bilaterally    Data Reviewed:  I have personally reviewed following labs and imaging studies  Micro Results Recent Results (from the past 240 hour(s))  Resp Panel by RT-PCR (Flu A&B, Covid) Nasopharyngeal Swab     Status: None   Collection Time: 03/08/21  6:48 AM   Specimen: Nasopharyngeal Swab; Nasopharyngeal(NP) swabs in vial transport medium  Result Value Ref Range Status   SARS Coronavirus 2 by RT PCR NEGATIVE NEGATIVE Final    Comment: (NOTE) SARS-CoV-2 target nucleic acids are NOT DETECTED.  The SARS-CoV-2 RNA is generally detectable in upper respiratory specimens during the acute phase of infection. The lowest concentration of SARS-CoV-2 viral copies this assay can detect is 138 copies/mL. A negative result does not preclude SARS-Cov-2 infection and should not be used as the sole basis for treatment or other patient management  decisions. A negative result may occur with  improper specimen collection/handling, submission of specimen other than nasopharyngeal swab, presence of viral mutation(s) within the areas targeted by this assay, and inadequate number of viral copies(<138 copies/mL). A negative result must be combined with clinical observations, patient history, and epidemiological information. The expected result is Negative.  Fact Sheet for Patients:  BloggerCourse.com  Fact Sheet for Healthcare Providers:  SeriousBroker.it  This test is no t yet approved or cleared by the [place] and  has been authorized for detection and/or diagnosis of SARS-CoV-2 by FDA under an Emergency Use Authorization (EUA). This EUA will remain  in effect (meaning this test can be used) for the duration of the COVID-19 declaration under Section 564(b)(1) of the Act, 21 U.S.C.section 360bbb-3(b)(1), unless the authorization is terminated  or revoked sooner.       Influenza A by PCR NEGATIVE NEGATIVE Final   Influenza B by PCR NEGATIVE NEGATIVE Final    Comment: (NOTE) The Xpert Xpress SARS-CoV-2/FLU/RSV plus assay is intended as an aid in the diagnosis of influenza from Nasopharyngeal swab specimens and should not be used as a sole basis for treatment. Nasal washings and aspirates are unacceptable for Xpert Xpress SARS-CoV-2/FLU/RSV testing.  Fact Sheet for Patients: BloggerCourse.com  Fact Sheet for Healthcare Providers: SeriousBroker.it  This test is not yet approved or cleared by the [place] FDA and has been authorized for detection and/or diagnosis of SARS-CoV-2 by FDA under an Emergency Use Authorization (EUA). This EUA will remain in effect (meaning this test can be used) for the duration of the COVID-19 declaration under Section 564(b)(1) of the Act, 21 U.S.C. section 360bbb-3(b)(1), unless the  authorization is terminated or revoked.  Performed at [facility], [address], [place], [place] [address]     Radiology Reports DG Chest 2 View  Result Date: 03/08/2021 CLINICAL DATA:  Shortness of breath EXAM: CHEST - 2 VIEW COMPARISON:  02/06/2018 FINDINGS: Cardiac shadow is enlarged but stable. Aortic calcifications are seen. Small pleural effusions are noted bilaterally. Focal airspace opacity is noted in the medial right lung base projecting in the right lower lobe. No bony abnormality is noted. IMPRESSION: Right lower lobe infiltrate with small pleural effusions bilaterally. Electronically Signed   By: Alcide Clever M.D.   On: 03/08/2021 03:31   CARDIAC CATHETERIZATION  Result Date: 03/11/2021   Dist RCA lesion is 20% stenosed.   Prox LAD lesion is 30% stenosed.   Dist LAD lesion is 70% stenosed.   2nd Mrg lesion is 60% stenosed.   Previously placed RPAV stent (unknown type) is  widely patent.   LV end diastolic pressure is normal.   LV end diastolic pressure is normal.   Hemodynamic findings consistent with mild pulmonary hypertension. 1.  Mild obstructive coronary artery disease with patent RPLV stent. 2.  Normal cardiac output and index with mean right atrial pressure of 5 mmHg and mean wedge pressure of 5 mmHg.   ECHOCARDIOGRAM COMPLETE  Result Date: 03/09/2021    ECHOCARDIOGRAM REPORT   Patient Name:   DREYTON ROESSNER Date of Exam: 03/09/2021 Medical Rec #:  [id number]     Height:       69.0 in Accession #:    [id number]    Weight:       161.0 lb Date of Birth:  03-Sep-1960     BSA:          1.884 m Patient Age:    59 years      BP:           115/98 mmHg Patient Gender: M             HR:           64 bpm. Exam Location:  Inpatient Procedure: 2D Echo, Cardiac Doppler and Color Doppler Indications:    CHF- Acute systolic  History:        Patient has no prior history of Echocardiogram examinations.  CHF, CAD and Acute MI, Stroke; Risk Factors:Diabetes and                  Hypertension.  Sonographer:    Gertie Fey MHA, RDMS, RVT, RDCS Referring Phys: [id number] JARED M GARDNER IMPRESSIONS  1. Left ventricular ejection fraction, by estimation, is 20 to 25%. The left ventricle has severely decreased function. The left ventricle demonstrates global hypokinesis. Left ventricular diastolic parameters are consistent with Grade II diastolic dysfunction (pseudonormalization).  2. Right ventricular systolic function is normal. The right ventricular size is mildly enlarged. There is normal pulmonary artery systolic pressure. The estimated right ventricular systolic pressure is 24.0 mmHg.  3. Right atrial size was mildly dilated.  4. The mitral valve is normal in structure. Trivial mitral valve regurgitation. No evidence of mitral stenosis.  5. Tricuspid valve regurgitation is moderate.  6. The aortic valve is tricuspid. Aortic valve regurgitation is not visualized. Mild aortic valve sclerosis is present, with no evidence of aortic valve stenosis.  7. The inferior vena cava is normal in size with greater than 50% respiratory variability, suggesting right atrial pressure of 3 mmHg. FINDINGS  Left Ventricle: Left ventricular ejection fraction, by estimation, is 20 to 25%. The left ventricle has severely decreased function. The left ventricle demonstrates global hypokinesis. The left ventricular internal cavity size was normal in size. There is no left ventricular hypertrophy. Left ventricular diastolic parameters are consistent with Grade II diastolic dysfunction (pseudonormalization). Right Ventricle: The right ventricular size is mildly enlarged. No increase in right ventricular wall thickness. Right ventricular systolic function is normal. There is normal pulmonary artery systolic pressure. The tricuspid regurgitant velocity is 2.29  m/s, and with an assumed right atrial pressure of 3 mmHg, the estimated right ventricular systolic pressure is 24.0 mmHg. Left Atrium: Left atrial size was  normal in size. Right Atrium: Right atrial size was mildly dilated. Pericardium: There is no evidence of pericardial effusion. Mitral Valve: The mitral valve is normal in structure. There is mild thickening of the mitral valve leaflet(s). There is mild calcification of the mitral valve leaflet(s). Trivial mitral valve regurgitation. No evidence of mitral valve stenosis. Tricuspid Valve: The tricuspid valve is normal in structure. Tricuspid valve regurgitation is moderate . No evidence of tricuspid stenosis. Aortic Valve: The aortic valve is tricuspid. Aortic valve regurgitation is not visualized. Mild aortic valve sclerosis is present, with no evidence of aortic valve stenosis. Aortic valve mean gradient measures 2.5 mmHg. Aortic valve peak gradient measures 4.8 mmHg. Aortic valve area, by VTI measures 1.58 cm. Pulmonic Valve: The pulmonic valve was normal in structure. Pulmonic valve regurgitation is not visualized. No evidence of pulmonic stenosis. Aorta: The aortic root is normal in size and structure. Venous: The inferior vena cava is normal in size with greater than 50% respiratory variability, suggesting right atrial pressure of 3 mmHg. IAS/Shunts: No atrial level shunt detected by color flow Doppler.  LEFT VENTRICLE PLAX 2D LVIDd:         4.90 cm      Diastology LVIDs:         4.50 cm      LV e' medial:    4.19 cm/s LV PW:         1.00 cm      LV E/e' medial:  11.3 LV IVS:        1.00 cm      LV e' lateral:   6.53 cm/s LVOT diam:     2.00 cm  LV E/e' lateral: 7.3 LV SV:         36 LV SV Index:   19 LVOT Area:     3.14 cm  LV Volumes (MOD) LV vol d, MOD A2C: 124.0 ml LV vol d, MOD A4C: 99.9 ml LV vol s, MOD A2C: 92.8 ml LV vol s, MOD A4C: 79.9 ml LV SV MOD A2C:     31.2 ml LV SV MOD A4C:     99.9 ml LV SV MOD BP:      25.6 ml RIGHT VENTRICLE RV S prime:     5.36 cm/s TAPSE (M-mode): 1.3 cm LEFT ATRIUM             Index        RIGHT ATRIUM           Index LA diam:        3.90 cm 2.07 cm/m   RA Area:      21.30 cm LA Vol (A2C):   51.5 ml 27.33 ml/m  RA Volume:   66.20 ml  35.14 ml/m LA Vol (A4C):   56.1 ml 29.78 ml/m LA Biplane Vol: 56.7 ml 30.09 ml/m  AORTIC VALVE AV Area (Vmax):    1.97 cm AV Area (Vmean):   1.83 cm AV Area (VTI):     1.58 cm AV Vmax:           109.00 cm/s AV Vmean:          72.500 cm/s AV VTI:            0.227 m AV Peak Grad:      4.8 mmHg AV Mean Grad:      2.5 mmHg LVOT Vmax:         68.40 cm/s LVOT Vmean:        42.200 cm/s LVOT VTI:          0.114 m LVOT/AV VTI ratio: 0.50  AORTA Ao Root diam: 3.00 cm MITRAL VALVE               TRICUSPID VALVE MV Area (PHT): 4.71 cm    TR Peak grad:   21.0 mmHg MV Decel Time: 161 msec    TR Vmax:        229.00 cm/s MV E velocity: 47.50 cm/s MV A velocity: 39.55 cm/s  SHUNTS MV E/A ratio:  1.20        Systemic VTI:  0.11 m                            Systemic Diam: 2.00 cm Donato Schultz MD Electronically signed by Donato Schultz MD Signature Date/Time: 03/09/2021/1:53:07 PM    Final    CT HEAD CODE STROKE WO CONTRAST  Result Date: 03/12/2021 CLINICAL DATA:  Code stroke.  Right-sided weakness EXAM: CT HEAD WITHOUT CONTRAST TECHNIQUE: Contiguous axial images were obtained from the base of the skull through the vertex without intravenous contrast. COMPARISON:  None. FINDINGS: Brain: There is no acute intracranial hemorrhage, mass effect, or edema. Gray-white differentiation is preserved. Patchy low-density in the supratentorial white matter is nonspecific but probably reflects mild to moderate chronic microvascular ischemic changes. Subcentimeter hyperdensity along the left aspect of the tentorium (series 6, image 21) is most consistent with a small meningioma. Vascular: No hyperdense vessel. There is intracranial atherosclerotic calcification at the skull base. Skull: Unremarkable. Sinuses/Orbits: Patchy paranasal sinus mucosal thickening. Orbits are unremarkable. Other: Mastoid air cells are clear. Right posterior  maxillary molar periapical lucency.  ASPECTS ([place]berta Stroke Program Early CT Score) - Ganglionic level infarction (caudate, lentiform nuclei, internal capsule, insula, M1-M3 cortex): 7 - Supraganglionic infarction (M4-M6 cortex): 3 Total score (0-10 with 10 being normal): 10 IMPRESSION: There is no acute intracranial hemorrhage or evidence of acute infarction. ASPECT score is 10. Chronic microvascular ischemic changes. Small subcentimeter meningioma of the tentorium. These results were communicated to Dr. Otelia Limes at 3:08 pm on 03/12/2021 by text page via the [facility] messaging system. Electronically Signed   By: Guadlupe Spanish M.D.   On: 03/12/2021 15:11    Lab Data:  CBC: Recent Labs  Lab 03/09/21 0303 03/10/21 0351 03/11/21 0406 03/11/21 0959 03/11/21 1006 03/11/21 1008  WBC 5.8 6.7 7.2  --   --   --   HGB 12.7* 13.2 12.7* 13.3 13.6 13.3  HCT 39.1 39.8 38.7* 39.0 40.0 39.0  MCV 86.7 84.0 84.7  --   --   --   PLT 239 267 267  --   --   --    Basic Metabolic Panel: Recent Labs  Lab 03/09/21 0303 03/10/21 0351 03/11/21 0406 03/11/21 0959 03/11/21 1006 03/11/21 1008 03/12/21 0355 03/13/21 0312  NA 139 144 143 143 143 144 142 140  K 4.4 4.0 3.8 3.8 3.9 3.7 4.2 4.1  CL 101 102 99  --   --   --  101 103  CO2 31 34* 33*  --   --   --  32 31  GLUCOSE 347* 169* 100*  --   --   --  117* 137*  BUN 19 21* 19  --   --   --  24* 22*  CREATININE 1.48* 1.56* 1.65*  --   --   --  1.88* 1.72*  CALCIUM 8.1* 8.3* 8.1*  --   --   --  8.2* 8.0*  MG 1.7 1.9 1.8  --   --   --   --   --   PHOS 4.4 4.2  --   --   --   --   --   --    GFR: Estimated Creatinine Clearance: 47.3 mL/min (A) (by C-G formula based on SCr of 1.72 mg/dL (H)). Liver Function Tests: Recent Labs  Lab 03/09/21 0303 03/10/21 0351  ALBUMIN 2.1* 2.1*   No results for input(s): LIPASE, AMYLASE in the last 168 hours. Recent Labs  Lab 03/12/21 1532  AMMONIA 31   Coagulation Profile: No results for input(s): INR, PROTIME in the last 168 hours. Cardiac  Enzymes: No results for input(s): CKTOTAL, CKMB, CKMBINDEX, TROPONINI in the last 168 hours. BNP (last 3 results) Recent Labs    02/03/21 0911  PROBNP 3,012.0*   HbA1C: No results for input(s): HGBA1C in the last 72 hours. CBG: Recent Labs  Lab 03/14/21 1151 03/14/21 1639 03/14/21 2154 03/15/21 0617 03/15/21 1055  GLUCAP 265* 159* 213* 105* 193*   Lipid Profile: Recent Labs    03/13/21 0312  CHOL 138  HDL 40*  LDLCALC 86  TRIG 60  CHOLHDL 3.5   Thyroid Function Tests: No results for input(s): TSH, T4TOTAL, FREET4, T3FREE, THYROIDAB in the last 72 hours. Anemia Panel: Recent Labs    03/12/21 1532  VITAMINB12 558  FOLATE 11.4   Urine analysis:    Component Value Date/Time   COLORURINE YELLOW 03/08/2021 0246   APPEARANCEUR HAZY (A) 03/08/2021 0246   LABSPEC 1.019 03/08/2021 0246   PHURINE 5.0 03/08/2021 0246   GLUCOSEU 150 (A) 03/08/2021  0246   HGBUR NEGATIVE 03/08/2021 0246   BILIRUBINUR NEGATIVE 03/08/2021 0246   BILIRUBINUR negative 01/02/2021 1312   KETONESUR NEGATIVE 03/08/2021 0246   PROTEINUR >=300 (A) 03/08/2021 0246   UROBILINOGEN 0.2 01/02/2021 1312   NITRITE NEGATIVE 03/08/2021 0246   LEUKOCYTESUR NEGATIVE 03/08/2021 0246     Anisha Starliper M.D. [facility] 03/15/2021, 1:45 PM  Available via Epic secure chat 7am-7pm After 7 pm, please refer to night coverage provider listed on amion.

## 2021-03-16 DIAGNOSIS — I509 Heart failure, unspecified: Secondary | ICD-10-CM | POA: Diagnosis not present

## 2021-03-16 DIAGNOSIS — I5023 Acute on chronic systolic (congestive) heart failure: Secondary | ICD-10-CM | POA: Diagnosis not present

## 2021-03-16 LAB — GLUCOSE, CAPILLARY
Glucose-Capillary: 161 mg/dL — ABNORMAL HIGH (ref 70–99)
Glucose-Capillary: 191 mg/dL — ABNORMAL HIGH (ref 70–99)
Glucose-Capillary: 91 mg/dL (ref 70–99)
Glucose-Capillary: 197 mg/dL — ABNORMAL HIGH (ref 70–99)

## 2021-03-16 LAB — BASIC METABOLIC PANEL
Anion gap: 9 (ref 5–15)
BUN: 21 mg/dL — ABNORMAL HIGH (ref 6–20)
CO2: 23 mmol/L (ref 22–32)
Calcium: 8.1 mg/dL — ABNORMAL LOW (ref 8.9–10.3)
Chloride: 106 mmol/L (ref 98–111)
Creatinine, Ser: 1.68 mg/dL — ABNORMAL HIGH (ref 0.61–1.24)
GFR, Estimated: 47 mL/min — ABNORMAL LOW (ref >60)
Glucose, Bld: 176 mg/dL — ABNORMAL HIGH (ref 70–99)
Potassium: 4.4 mmol/L (ref 3.5–5.1)
Sodium: 138 mmol/L (ref 135–145)

## 2021-03-16 NOTE — Plan of Care (Signed)
  Problem: Pain Managment: Goal: General experience of comfort will improve Outcome: Completed/Met   Problem: Elimination: Goal: Will not experience complications related to urinary retention Outcome: Completed/Met   Problem: Elimination: Goal: Will not experience complications related to bowel motility Outcome: Completed/Met   Problem: Nutrition: Goal: Adequate nutrition will be maintained Outcome: Completed/Met

## 2021-03-16 NOTE — Progress Notes (Signed)
[facility]                                                                              Patient Demographics  Thomas West, is a 60 y.o. male, DOB - 1960/12/05, OJJ:[id number]  Admit date - 03/08/2021   Admitting Physician Almon Hercules, MD  Outpatient Primary MD for the patient is [name], [name] Expose, MD  Outpatient specialists:   LOS - 7  days   Medical records reviewed and are as summarized below:    Chief Complaint  Patient presents with   Shortness of Breath       Brief summary   60 year old male with history of systolic CHF, CAD, DM-2, HTN, CVA and CKD-3A presenting with progressive edema, orthopnea and dyspnea on exertion for about 2 weeks.  Patient moved from [place] to [place] about a year ago.  He has not established care with PCP, and has not been able to fill his medications.  He is not sure about taking diuretics.  He also admits to taking ibuprofen twice daily.  Hypertensive to 180/120s.  BNP elevated to 3600.  Admitted for acute on chronic systolic CHF with marked anasarca, and started on IV Lasix.    Assessment & Plan    Principal Problem: Acute metabolic encephalopathy with dizziness, gait instability, -Resolved, work-up negative.  CT head showed no acute stroke, was evaluated by neurology. -Neurontin discontinued, B12, folate, B1, ammonia level normal.   -No acute issues, alert and oriented, appears at his baseline -PT recommended CIR, currently awaiting insurance authorization     Acute on chronic combined systolic and diastolic CHF -Patient presented with progressive edema, orthopnea, dyspnea on exertion, volume overload, elevated BNP -Initially placed on IV Lasix, now transitioned to oral Lasix 40 mg daily, negative balance of 13.9 L -2D echo showed EF of 20 to 25% with severely decreased LV function, global hypokinesis, grade 2DD -Underwent cardiac cath on 10/19, mild obstructive CAD with patent RPLV stent -Entresto held due to  creatinine trending up, now improving, creatinine 1.6 Continue Lasix, Coreg, Imdur, hydralazine  Hypertensive urgency -BP stable, continue Coreg, hydralazine, Imdur, Lasix   History of MI (myocardial infarction), CAD -Has been noncompliant,  -Continue aspirin, Plavix, statin   Type 2 diabetes mellitus with diabetic neuropathy, unspecified (HCC) -Uncontrolled, hemoglobin A1c> 14.0 -CBGs currently stable, continue Semglee 15 units daily, NovoLog 3 units 3 times daily AC, SSI  -At the time of discharge will place on metformin XL, glipizide XL and semaglutide 3 mg  PO daily  AKI on CKD stage IIIa -Baseline creatinine 1.4-1.5 -Creatinine trended up to 1.8 likely due to diuresis, Entresto  -Creatinine improving, 1.6  Code Status: Full  DVT Prophylaxis:  Place TED hose Start: 03/12/21 1113 Place and maintain sequential compression device Start: 03/09/21 0903 enoxaparin (LOVENOX) injection 40 mg Start: 03/08/21 1000   Level of Care: Level of care: Telemetry Cardiac Family Communication: Discussed all imaging results, lab results, explained to the patient    Disposition Plan:     Status is: Inpatient  Remains inpatient appropriate because: Awaiting CIR bed   Time Spent in minutes 25 minutes  Procedures:  2D echo  Consultants:   Cardiology   Antimicrobials:   Anti-infectives (From admission, onward)    None          Medications  Scheduled Meds:  aspirin EC  81 mg Oral Daily   atorvastatin  80 mg Oral Daily   carvedilol  25 mg Oral BID   clopidogrel  75 mg Oral Daily   docusate sodium  100 mg Oral Q12H   empagliflozin  10 mg Oral QAC breakfast   enoxaparin (LOVENOX) injection  40 mg Subcutaneous Q24H   furosemide  40 mg Oral Daily   hydrALAZINE  50 mg Oral TID   insulin aspart  0-15 Units Subcutaneous TID WC   insulin aspart  3 Units Subcutaneous TID WC   insulin glargine-yfgn  15 Units Subcutaneous Daily   isosorbide mononitrate  60 mg Oral Daily    polyethylene glycol  17 g Oral Daily   sodium chloride flush  3 mL Intravenous Q12H   Continuous Infusions:  sodium chloride     PRN Meds:.sodium chloride, acetaminophen, acetaminophen, ondansetron (ZOFRAN) IV, ondansetron (ZOFRAN) IV, sodium chloride flush      Subjective:   Densel Kronick was seen and examined today.  No complaints, awaiting CIR.    Objective:   Vitals:   03/15/21 2037 03/16/21 0444 03/16/21 0827 03/16/21 1029  BP: 134/78 128/81 140/84 126/76  Pulse:  69  70  Resp: Temp: 98.5 F (36.9 C) 98.4 F (36.9 C)  98.8 F (37.1 C)  TempSrc: Oral Oral  Oral  SpO2: 96% 99%  98%  Weight:  70.2 kg    Height:        Intake/Output Summary (Last 24 hours) at 03/16/2021 1548 Last data filed at 03/16/2021 1245 Gross per 24 hour  Intake 720 ml  Output 2000 ml  Net -1280 ml     Wt Readings from Last 3 Encounters:  03/16/21 70.2 kg  02/03/21 73 kg  03/27/19 79.7 kg   Physical Exam General: Alert and oriented x 3, NAD Cardiovascular: S1 S2 clear, RRR. No pedal edema b/l Respiratory: CTAB, no wheezing, rales or rhonchi Gastrointestinal: Soft, nontender, nondistended, NBS Ext: no pedal edema bilaterally Psych: Normal affect and demeanor, alert and oriented x3      Data Reviewed:  I have personally reviewed following labs and imaging studies  Micro Results Recent Results (from the past 240 hour(s))  Resp Panel by RT-PCR (Flu A&B, Covid) Nasopharyngeal Swab     Status: None   Collection Time: 03/08/21  6:48 AM   Specimen: Nasopharyngeal Swab; Nasopharyngeal(NP) swabs in vial transport medium  Result Value Ref Range Status   SARS Coronavirus 2 by RT PCR NEGATIVE NEGATIVE Final    Comment: (NOTE) SARS-CoV-2 target nucleic acids are NOT DETECTED.  The SARS-CoV-2 RNA is generally detectable in upper respiratory specimens during the acute phase of infection. The lowest concentration of SARS-CoV-2 viral copies this assay can detect is 138  copies/mL. A negative result does not preclude SARS-Cov-2 infection and should not be used as the sole basis for treatment or other patient management decisions. A negative result may occur with  improper specimen collection/handling, submission of specimen other than nasopharyngeal swab, presence of viral mutation(s) within the areas targeted by this assay, and inadequate number of viral copies(<138 copies/mL). A negative result must be combined with clinical observations, patient history, and epidemiological information. The expected result is Negative.  Fact Sheet for Patients:  BloggerCourse.com  Fact Sheet for  Healthcare Providers:  SeriousBroker.it  This test is no t yet approved or cleared by the [place] and  has been authorized for detection and/or diagnosis of SARS-CoV-2 by FDA under an Emergency Use Authorization (EUA). This EUA will remain  in effect (meaning this test can be used) for the duration of the COVID-19 declaration under Section 564(b)(1) of the Act, 21 U.S.C.section 360bbb-3(b)(1), unless the authorization is terminated  or revoked sooner.       Influenza A by PCR NEGATIVE NEGATIVE Final   Influenza B by PCR NEGATIVE NEGATIVE Final    Comment: (NOTE) The Xpert Xpress SARS-CoV-2/FLU/RSV plus assay is intended as an aid in the diagnosis of influenza from Nasopharyngeal swab specimens and should not be used as a sole basis for treatment. Nasal washings and aspirates are unacceptable for Xpert Xpress SARS-CoV-2/FLU/RSV testing.  Fact Sheet for Patients: BloggerCourse.com  Fact Sheet for Healthcare Providers: SeriousBroker.it  This test is not yet approved or cleared by the [place] FDA and has been authorized for detection and/or diagnosis of SARS-CoV-2 by FDA under an Emergency Use Authorization (EUA). This EUA will remain in effect (meaning  this test can be used) for the duration of the COVID-19 declaration under Section 564(b)(1) of the Act, 21 U.S.C. section 360bbb-3(b)(1), unless the authorization is terminated or revoked.  Performed at [facility], [address] [place], [place] [address]     Radiology Reports DG Chest 2 View  Result Date: 03/08/2021 CLINICAL DATA:  Shortness of breath EXAM: CHEST - 2 VIEW COMPARISON:  02/06/2018 FINDINGS: Cardiac shadow is enlarged but stable. Aortic calcifications are seen. Small pleural effusions are noted bilaterally. Focal airspace opacity is noted in the medial right lung base projecting in the right lower lobe. No bony abnormality is noted. IMPRESSION: Right lower lobe infiltrate with small pleural effusions bilaterally. Electronically Signed   By: Alcide Clever M.D.   On: 03/08/2021 03:31   CARDIAC CATHETERIZATION  Result Date: 03/11/2021   Dist RCA lesion is 20% stenosed.   Prox LAD lesion is 30% stenosed.   Dist LAD lesion is 70% stenosed.   2nd Mrg lesion is 60% stenosed.   Previously placed RPAV stent (unknown type) is  widely patent.   LV end diastolic pressure is normal.   LV end diastolic pressure is normal.   Hemodynamic findings consistent with mild pulmonary hypertension. 1.  Mild obstructive coronary artery disease with patent RPLV stent. 2.  Normal cardiac output and index with mean right atrial pressure of 5 mmHg and mean wedge pressure of 5 mmHg.   ECHOCARDIOGRAM COMPLETE  Result Date: 03/09/2021    ECHOCARDIOGRAM REPORT   Patient Name:   SIDDIQ KALUZNY Date of Exam: 03/09/2021 Medical Rec #:  [id number]     Height:       69.0 in Accession #:    [id number]    Weight:       161.0 lb Date of Birth:  07/22/1960     BSA:          1.884 m Patient Age:    59 years      BP:           115/98 mmHg Patient Gender: M             HR:           64 bpm. Exam Location:  Inpatient Procedure: 2D Echo, Cardiac Doppler and Color Doppler Indications:    CHF- Acute systolic  History:  Patient has no prior history of Echocardiogram examinations.                 CHF, CAD and Acute MI, Stroke; Risk Factors:Diabetes and                 Hypertension.  Sonographer:    Gertie Fey MHA, RDMS, RVT, RDCS Referring Phys: [id number] JARED M GARDNER IMPRESSIONS  1. Left ventricular ejection fraction, by estimation, is 20 to 25%. The left ventricle has severely decreased function. The left ventricle demonstrates global hypokinesis. Left ventricular diastolic parameters are consistent with Grade II diastolic dysfunction (pseudonormalization).  2. Right ventricular systolic function is normal. The right ventricular size is mildly enlarged. There is normal pulmonary artery systolic pressure. The estimated right ventricular systolic pressure is 24.0 mmHg.  3. Right atrial size was mildly dilated.  4. The mitral valve is normal in structure. Trivial mitral valve regurgitation. No evidence of mitral stenosis.  5. Tricuspid valve regurgitation is moderate.  6. The aortic valve is tricuspid. Aortic valve regurgitation is not visualized. Mild aortic valve sclerosis is present, with no evidence of aortic valve stenosis.  7. The inferior vena cava is normal in size with greater than 50% respiratory variability, suggesting right atrial pressure of 3 mmHg. FINDINGS  Left Ventricle: Left ventricular ejection fraction, by estimation, is 20 to 25%. The left ventricle has severely decreased function. The left ventricle demonstrates global hypokinesis. The left ventricular internal cavity size was normal in size. There is no left ventricular hypertrophy. Left ventricular diastolic parameters are consistent with Grade II diastolic dysfunction (pseudonormalization). Right Ventricle: The right ventricular size is mildly enlarged. No increase in right ventricular wall thickness. Right ventricular systolic function is normal. There is normal pulmonary artery systolic pressure. The tricuspid regurgitant velocity is 2.29  m/s,  and with an assumed right atrial pressure of 3 mmHg, the estimated right ventricular systolic pressure is 24.0 mmHg. Left Atrium: Left atrial size was normal in size. Right Atrium: Right atrial size was mildly dilated. Pericardium: There is no evidence of pericardial effusion. Mitral Valve: The mitral valve is normal in structure. There is mild thickening of the mitral valve leaflet(s). There is mild calcification of the mitral valve leaflet(s). Trivial mitral valve regurgitation. No evidence of mitral valve stenosis. Tricuspid Valve: The tricuspid valve is normal in structure. Tricuspid valve regurgitation is moderate . No evidence of tricuspid stenosis. Aortic Valve: The aortic valve is tricuspid. Aortic valve regurgitation is not visualized. Mild aortic valve sclerosis is present, with no evidence of aortic valve stenosis. Aortic valve mean gradient measures 2.5 mmHg. Aortic valve peak gradient measures 4.8 mmHg. Aortic valve area, by VTI measures 1.58 cm. Pulmonic Valve: The pulmonic valve was normal in structure. Pulmonic valve regurgitation is not visualized. No evidence of pulmonic stenosis. Aorta: The aortic root is normal in size and structure. Venous: The inferior vena cava is normal in size with greater than 50% respiratory variability, suggesting right atrial pressure of 3 mmHg. IAS/Shunts: No atrial level shunt detected by color flow Doppler.  LEFT VENTRICLE PLAX 2D LVIDd:         4.90 cm      Diastology LVIDs:         4.50 cm      LV e' medial:    4.19 cm/s LV PW:         1.00 cm      LV E/e' medial:  11.3 LV IVS:        1.00  cm      LV e' lateral:   6.53 cm/s LVOT diam:     2.00 cm      LV E/e' lateral: 7.3 LV SV:         36 LV SV Index:   19 LVOT Area:     3.14 cm  LV Volumes (MOD) LV vol d, MOD A2C: 124.0 ml LV vol d, MOD A4C: 99.9 ml LV vol s, MOD A2C: 92.8 ml LV vol s, MOD A4C: 79.9 ml LV SV MOD A2C:     31.2 ml LV SV MOD A4C:     99.9 ml LV SV MOD BP:      25.6 ml RIGHT VENTRICLE RV S prime:      5.36 cm/s TAPSE (M-mode): 1.3 cm LEFT ATRIUM             Index        RIGHT ATRIUM           Index LA diam:        3.90 cm 2.07 cm/m   RA Area:     21.30 cm LA Vol (A2C):   51.5 ml 27.33 ml/m  RA Volume:   66.20 ml  35.14 ml/m LA Vol (A4C):   56.1 ml 29.78 ml/m LA Biplane Vol: 56.7 ml 30.09 ml/m  AORTIC VALVE AV Area (Vmax):    1.97 cm AV Area (Vmean):   1.83 cm AV Area (VTI):     1.58 cm AV Vmax:           109.00 cm/s AV Vmean:          72.500 cm/s AV VTI:            0.227 m AV Peak Grad:      4.8 mmHg AV Mean Grad:      2.5 mmHg LVOT Vmax:         68.40 cm/s LVOT Vmean:        42.200 cm/s LVOT VTI:          0.114 m LVOT/AV VTI ratio: 0.50  AORTA Ao Root diam: 3.00 cm MITRAL VALVE               TRICUSPID VALVE MV Area (PHT): 4.71 cm    TR Peak grad:   21.0 mmHg MV Decel Time: 161 msec    TR Vmax:        229.00 cm/s MV E velocity: 47.50 cm/s MV A velocity: 39.55 cm/s  SHUNTS MV E/A ratio:  1.20        Systemic VTI:  0.11 m                            Systemic Diam: 2.00 cm Donato Schultz MD Electronically signed by Donato Schultz MD Signature Date/Time: 03/09/2021/1:53:07 PM    Final    CT HEAD CODE STROKE WO CONTRAST  Result Date: 03/12/2021 CLINICAL DATA:  Code stroke.  Right-sided weakness EXAM: CT HEAD WITHOUT CONTRAST TECHNIQUE: Contiguous axial images were obtained from the base of the skull through the vertex without intravenous contrast. COMPARISON:  None. FINDINGS: Brain: There is no acute intracranial hemorrhage, mass effect, or edema. Gray-white differentiation is preserved. Patchy low-density in the supratentorial white matter is nonspecific but probably reflects mild to moderate chronic microvascular ischemic changes. Subcentimeter hyperdensity along the left aspect of the tentorium (series 6, image 21) is most consistent with a small meningioma. Vascular: No hyperdense vessel. There is  intracranial atherosclerotic calcification at the skull base. Skull: Unremarkable. Sinuses/Orbits: Patchy  paranasal sinus mucosal thickening. Orbits are unremarkable. Other: Mastoid air cells are clear. Right posterior maxillary molar periapical lucency. ASPECTS (Alberta Stroke Program Early CT Score) - Ganglionic level infarction (caudate, lentiform nuclei, internal capsule, insula, M1-M3 cortex): 7 - Supraganglionic infarction (M4-M6 cortex): 3 Total score (0-10 with 10 being normal): 10 IMPRESSION: There is no acute intracranial hemorrhage or evidence of acute infarction. ASPECT score is 10. Chronic microvascular ischemic changes. Small subcentimeter meningioma of the tentorium. These results were communicated to Dr. Otelia Limes at 3:08 pm on 03/12/2021 by text page via the [facility] messaging system. Electronically Signed   By: Guadlupe Spanish M.D.   On: 03/12/2021 15:11    Lab Data:  CBC: Recent Labs  Lab 03/10/21 0351 03/11/21 0406 03/11/21 0959 03/11/21 1006 03/11/21 1008  WBC 6.7 7.2  --   --   --   HGB 13.2 12.7* 13.3 13.6 13.3  HCT 39.8 38.7* 39.0 40.0 39.0  MCV 84.0 84.7  --   --   --   PLT 267 267  --   --   --    Basic Metabolic Panel: Recent Labs  Lab 03/10/21 0351 03/11/21 0406 03/11/21 0959 03/11/21 1006 03/11/21 1008 03/12/21 0355 03/13/21 0312 03/16/21 0503  NA 144 143   < > 143 144 142 140 138  K 4.0 3.8   < > 3.9 3.7 4.2 4.1 4.4  CL 102 99  --   --   --  101 103 106  CO2 34* 33*  --   --   --  32 31 23  GLUCOSE 169* 100*  --   --   --  117* 137* 176*  BUN 21* 19  --   --   --  24* 22* 21*  CREATININE 1.56* 1.65*  --   --   --  1.88* 1.72* 1.68*  CALCIUM 8.3* 8.1*  --   --   --  8.2* 8.0* 8.1*  MG 1.9 1.8  --   --   --   --   --   --   PHOS 4.2  --   --   --   --   --   --   --    < > = values in this interval not displayed.   GFR: Estimated Creatinine Clearance: 48.2 mL/min (A) (by C-G formula based on SCr of 1.68 mg/dL (H)). Liver Function Tests: Recent Labs  Lab 03/10/21 0351  ALBUMIN 2.1*   No results for input(s): LIPASE, AMYLASE in the last 168  hours. Recent Labs  Lab 03/12/21 1532  AMMONIA 31   Coagulation Profile: No results for input(s): INR, PROTIME in the last 168 hours. Cardiac Enzymes: No results for input(s): CKTOTAL, CKMB, CKMBINDEX, TROPONINI in the last 168 hours. BNP (last 3 results) Recent Labs    02/03/21 0911  PROBNP 3,012.0*   HbA1C: No results for input(s): HGBA1C in the last 72 hours. CBG: Recent Labs  Lab 03/15/21 1055 03/15/21 1625 03/15/21 2119 03/16/21 0613 03/16/21 1030  GLUCAP 193* 99 164* 161* 191*   Lipid Profile: No results for input(s): CHOL, HDL, LDLCALC, TRIG, CHOLHDL, LDLDIRECT in the last 72 hours.  Thyroid Function Tests: No results for input(s): TSH, T4TOTAL, FREET4, T3FREE, THYROIDAB in the last 72 hours. Anemia Panel: No results for input(s): VITAMINB12, FOLATE, FERRITIN, TIBC, IRON, RETICCTPCT in the last 72 hours.  Urine analysis:    Component Value Date/Time  COLORURINE YELLOW 03/08/2021 0246   APPEARANCEUR HAZY (A) 03/08/2021 0246   LABSPEC 1.019 03/08/2021 0246   PHURINE 5.0 03/08/2021 0246   GLUCOSEU 150 (A) 03/08/2021 0246   HGBUR NEGATIVE 03/08/2021 0246   BILIRUBINUR NEGATIVE 03/08/2021 0246   BILIRUBINUR negative 01/02/2021 1312   KETONESUR NEGATIVE 03/08/2021 0246   PROTEINUR >=300 (A) 03/08/2021 0246   UROBILINOGEN 0.2 01/02/2021 1312   NITRITE NEGATIVE 03/08/2021 0246   LEUKOCYTESUR NEGATIVE 03/08/2021 0246     Deanza Upperman M.D. [facility] 03/16/2021, 3:48 PM  Available via Epic secure chat 7am-7pm After 7 pm, please refer to night coverage provider listed on amion.

## 2021-03-16 NOTE — TOC Initial Note (Addendum)
Transition of Care [facility]) - Initial/Assessment Note    Patient Details  Name: Thomas West MRN: [id number] Date of Birth: 15-Dec-1960  [facility] CM/SW Contact:    Elliot Cousin, RN Phone Number: [phone] 03/16/2021, 6:20 PM  Clinical Narrative:                 Spoke to pt and states son lives in the home but he has good family support from sister and nephew. Pt scheduled for dc to IP rehab. Waiting insurance approval. Has cane at home. He was driving to appts but his nephew will take him if he unable to drive.   Expected Discharge Plan: IP Rehab Facility Barriers to Discharge: Continued Medical Work up   Patient Goals and CMS Choice Patient states their goals for this hospitalization and ongoing recovery are:: wants to get stronger CMS Medicare.gov Compare Post Acute Care list provided to:: Patient Choice offered to / list presented to : Patient  Expected Discharge Plan and Services Expected Discharge Plan: IP Rehab Facility In-house Referral: Clinical Social Work Discharge Planning Services: CM Consult   Living arrangements for the past 2 months: Single Family Home, Apartment                                      Prior Living Arrangements/Services Living arrangements for the past 2 months: Single Family Home, Apartment Lives with:: Self          Need for Family Participation in Patient Care: Yes (Comment) Care giver support system in place?: Yes (comment)   Criminal Activity/Legal Involvement Pertinent to Current Situation/Hospitalization: No - Comment as needed  Activities of Daily Living Home Assistive Devices/Equipment: None ADL Screening (condition at time of admission) Patient's cognitive ability adequate to safely complete daily activities?: Yes Is the patient deaf or have difficulty hearing?: No Does the patient have difficulty seeing, even when wearing glasses/contacts?: No Does the patient have difficulty concentrating,  remembering, or making decisions?: No Patient able to express need for assistance with ADLs?: Yes Does the patient have difficulty dressing or bathing?: No Independently performs ADLs?: Yes (appropriate for developmental age) Does the patient have difficulty walking or climbing stairs?: No Weakness of Legs: Both Weakness of Arms/Hands: None  Permission Sought/Granted Permission sought to share information with : Case Manager, Family Supports, Magazine features editor, PCP Permission granted to share information with : Yes, Verbal Permission Granted              Emotional Assessment           Psych Involvement: No (comment)  Admission diagnosis:  Acute on chronic systolic CHF (congestive heart failure) (HCC) [I50.23] Acute on chronic HFrEF (heart failure with reduced ejection fraction) (HCC) [I50.23] Acute on chronic congestive heart failure, unspecified heart failure type (HCC) [I50.9] Patient Active Problem List   Diagnosis Date Noted   Acute on chronic systolic CHF (congestive heart failure) (HCC) 03/09/2021   Acute on chronic HFrEF (heart failure with reduced ejection fraction) (HCC) 03/08/2021   HFrEF (heart failure with reduced ejection fraction) (HCC) 02/03/2021   Peripheral neuropathy 02/03/2021   Diabetes mellitus (HCC) 03/27/2019   Hyperlipidemia associated with type 2 diabetes mellitus (HCC) 03/27/2019   CAD (coronary artery disease) 03/05/2019   History of MI (myocardial infarction) 03/05/2019   Type 2 diabetes mellitus with diabetic neuropathy, unspecified (HCC) 03/05/2019   CKD (chronic kidney disease), stage II  03/05/2019   HLD (hyperlipidemia) 03/05/2019   GERD (gastroesophageal reflux disease) 03/05/2019   Hypertension, essential, benign 03/05/2019   PCP:  [name], [name], MD Pharmacy:   [facility] - [place], [place] - [address] [facility] [address] [place] [address] Phone: [phone] Fax:  [phone]  [id number] [place], [place] - [address] AT [facility] [address] [place] [address] Phone: [phone] Fax: [phone]     Social Determinants of Health (SDOH) Interventions Food Insecurity Interventions: Intervention Not Indicated Financial Strain Interventions: Intervention Not Indicated Housing Interventions: Intervention Not Indicated Transportation Interventions: [facility]  Readmission Risk Interventions No flowsheet data found.

## 2021-03-16 NOTE — Progress Notes (Signed)
Inpatient Rehab Admissions Coordinator:   Following for my colleague, Roderic Palau.  Awaiting determination from [facility] Medicare regarding prior auth request for CIR.  Will continue to follow.   Estill Dooms, PT, DPT Admissions Coordinator [phone] 03/16/21  9:52 AM

## 2021-03-16 NOTE — Progress Notes (Addendum)
CARDIAC REHAB PHASE I   PRE:  Rate/Rhythm: 74 SR with PVCs    BP: sitting 140/84    SaO2: 96 RA  MODE:  Ambulation: 130 ft   POST:  Rate/Rhythm: 90 SR with PVCs    BP: sitting 137/77     SaO2: 98 RA   Pt able to move to EOB. Three attempts to stand due to poor planning. Ambulated with RW and gait belt support. Slow pace/stride, reminders to stay inside RW, fatigue with distance, rest x3 briefly. To recliner, on alarm.  Began discussing HF booklet. Pt could not tell me what was wrong with his heart, he knew he came to the hospital with swelling. He sts he does not have a scale but could get one. He was unable to recall amount of sodium per day recommendation after education. 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 Encouraged pt to read materials and will fu for reiteration. Pt does st he has a pill box. He is now wanting to go home. Reminders given for pt that it is hard for him to walk right now, he isn't ready to drive. [id number]  Harriet Masson CES, ACSM 03/16/2021 9:35 AM

## 2021-03-16 NOTE — Progress Notes (Signed)
Occupational Therapy Treatment Patient Details Name: Thomas West MRN: [id number] DOB: [birth year] Today's Date: 03/16/2021   History of present illness Thomas West is a 60 y.o. male who presented 03/08/21 with progressive edema, orthopnea and dyspnea on exertion for about 2 weeks. S/p cardiac cath 10/19. Pt with change in status 10/20 wtih Code Stroke called but CT negative. Possible acute encephalopathy. Medical history significant of HFrEF, prior MI, DM2, HTN. Pt presents to ED with 2+ weeks of worsening peripheral edema, anasarca.   OT comments  Pt is slowly progressing towards OT goals. Pt remains limited by balance, weakness, activity tolerance, and cognition (details on cognitive deficits below). During session, pt ambulated to BR using RW with Min guard and completed toilet transfer/toilet hygiene with Min guard. Pt continues to be appropriate for CIR upon d/West. Will continue to follow acutely.   Recommendations for follow up therapy are one component of a multi-disciplinary discharge planning process, led by the attending physician.  Recommendations may be updated based on patient status, additional functional criteria and insurance authorization.    Follow Up Recommendations  Acute inpatient rehab (3hours/day)    Assistance Recommended at Discharge Frequent or constant Supervision/Assistance  Equipment Recommendations  None recommended by OT    Recommendations for Other Services      Precautions / Restrictions Precautions Precautions: Fall Precaution Comments: bowel incontinence (reports this is new) Restrictions Weight Bearing Restrictions: No       Mobility Bed Mobility Overal bed mobility: Needs Assistance Bed Mobility: Sit to Supine       Sit to supine: Supervision        Transfers Overall transfer level: Needs assistance Equipment used: Rolling walker (2 wheels) Transfers: Sit to/from Stand Sit to Stand: Min guard                 Balance Overall  balance assessment: Needs assistance Sitting-balance support: Feet supported Sitting balance-Leahy Scale: Good Sitting balance - Comments: Static sitting EOB with supervision.   Standing balance support: Bilateral upper extremity supported;During functional activity Standing balance-Leahy Scale: Poor                             ADL either performed or assessed with clinical judgement   ADL Overall ADL's : Needs assistance/impaired                         Toilet Transfer: Min guard;Ambulation;Regular Toilet;Rolling walker (2 wheels);Grab bars   Toileting- Clothing Manipulation and Hygiene: Min guard;Sit to/from stand       Functional mobility during ADLs: Min guard;Rolling walker (2 wheels) General ADL Comments: Pt remians limited by balance, weakness, activity tolerance, and cognition.     Vision       Perception     Praxis      Cognition Arousal/Alertness: Awake/alert Behavior During Therapy: Flat affect Overall Cognitive Status: Impaired/Different from baseline Area of Impairment: Orientation;Attention;Memory;Following commands;Awareness;Safety/judgement;Problem solving                 Orientation Level: Disoriented to;Place;Time;Situation Current Attention Level: Sustained Memory: Decreased short-term memory Following Commands: Follows one step commands with increased time;Follows multi-step commands inconsistently;Follows one step commands inconsistently Safety/Judgement: Decreased awareness of safety;Decreased awareness of deficits Awareness: Emergent Problem Solving: Slow processing;Decreased initiation;Difficulty sequencing;Requires verbal cues;Requires tactile cues General Comments: Pt unable to recall date or year. 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 Requiring  increased tome for processing and initiaition.          Exercises     Shoulder Instructions       General Comments       Pertinent Vitals/ Pain       Pain Assessment: No/denies pain  Home Living                                          Prior Functioning/Environment              Frequency  Min 2X/week        Progress Toward Goals  OT Goals(current goals can now be found in the care plan section)  Progress towards OT goals: Progressing toward goals  Acute Rehab OT Goals OT Goal Formulation: With patient Time For Goal Achievement: 03/25/21 Potential to Achieve Goals: Good ADL Goals Pt Will Transfer to Toilet: with modified independence;ambulating Additional ADL Goal #1: Pt to increase standing activity tolerance to > 10 min to improve endurance for ADLs/IADLs Additional ADL Goal #2: Pt to verbalize at least 3 strategies to minimize risk of CHF exacerbation Additional ADL Goal #3: Pt to complete pill box test with 0 errors  Plan Discharge plan remains appropriate;Frequency remains appropriate    Co-evaluation                 AM-PAC OT "6 Clicks" Daily Activity     Outcome Measure   Help from another person eating meals?: None Help from another person taking care of personal grooming?: A Little Help from another person toileting, which includes using toliet, bedpan, or urinal?: A Little Help from another person bathing (including washing, rinsing, drying)?: A Lot Help from another person to put on and taking off regular upper body clothing?: A Little Help from another person to put on and taking off regular lower body clothing?: A Lot 6 Click Score: 17    End of Session Equipment Utilized During Treatment: Gait belt;Rolling walker (2 wheels)  OT Visit Diagnosis: Unsteadiness on feet (R26.81);Other abnormalities of gait and mobility (R26.89);Muscle weakness (generalized) (M62.81)   Activity Tolerance Patient tolerated treatment well   Patient Left in bed;with call bell/phone within reach;with bed alarm set   Nurse Communication Mobility status         Time: 3875-6433 OT Time Calculation (min): 15 min  Charges: OT General Charges $OT Visit: 1 Visit OT Treatments $Self Care/Home Management : 8-22 mins  Thomas West, OT/L  Acute Rehab [phone]  Thomas West 03/16/2021, 2:01 PM

## 2021-03-16 NOTE — Progress Notes (Signed)
Inpatient Rehab Admissions Coordinator:   Met with pt at the bedside and spoke to his sister by speaker phone.  Pt initially reluctant to agree to come to CIR (wants to go home).  I explained that our goal is short term rehab to improve his independence at home, and to reduce the risk of re-admission.  I let him know insurance was still pending, and we will continue to monitor his progress while in acute to see if he could go home.  For now, I am following.   Shann Medal, PT, DPT Admissions Coordinator [phone] 03/16/21  12:09 PM

## 2021-03-17 ENCOUNTER — Ambulatory Visit: Payer: Medicare Other | Admitting: Family Medicine

## 2021-03-17 DIAGNOSIS — N182 Chronic kidney disease, stage 2 (mild): Secondary | ICD-10-CM | POA: Diagnosis not present

## 2021-03-17 DIAGNOSIS — I509 Heart failure, unspecified: Secondary | ICD-10-CM | POA: Diagnosis not present

## 2021-03-17 DIAGNOSIS — I5023 Acute on chronic systolic (congestive) heart failure: Secondary | ICD-10-CM | POA: Diagnosis not present

## 2021-03-17 LAB — GLUCOSE, CAPILLARY
Glucose-Capillary: 100 mg/dL — ABNORMAL HIGH (ref 70–99)
Glucose-Capillary: 113 mg/dL — ABNORMAL HIGH (ref 70–99)
Glucose-Capillary: 188 mg/dL — ABNORMAL HIGH (ref 70–99)

## 2021-03-17 MED ORDER — SEMAGLUTIDE 3 MG PO TABS: 3.0000 mg | ORAL_TABLET | Freq: Every day | ORAL | 3 refills | Status: DC

## 2021-03-17 MED ORDER — SIMETHICONE 80 MG PO CHEW
80.0000 mg | CHEWABLE_TABLET | Freq: Three times a day (TID) | ORAL | Status: DC
  Administered 2021-03-17: 80 mg via ORAL
  Filled 2021-03-17: qty 1

## 2021-03-17 MED ORDER — FUROSEMIDE 40 MG PO TABS: 40.0000 mg | ORAL_TABLET | Freq: Every day | ORAL | 3 refills | Status: DC

## 2021-03-17 MED ORDER — CLOPIDOGREL BISULFATE 75 MG PO TABS: 75.0000 mg | ORAL_TABLET | Freq: Every day | ORAL | 3 refills | Status: DC

## 2021-03-17 NOTE — Progress Notes (Signed)
Inpatient Rehab Admissions Coordinator:   Fransico Him requesting peer to peer for this member.  Notified Dr Isidoro Donning.    Estill Dooms, PT, DPT Admissions Coordinator [phone] 03/17/21  9:30 AM

## 2021-03-17 NOTE — Progress Notes (Signed)
  Mobility Specialist Criteria Algorithm Info.  Mobility Team: [facility] elevated:HOB less than 20 Activity: Ambulated in hall Range of motion: Active; All extremities Level of assistance: Contact guard assist, steadying assist Assistive device: Front wheel walker Minutes sitting in chair:  Minutes stood:  Minutes ambulated: 5 minutes Distance ambulated (ft): 100 ft Mobility response: Tolerated well Bed Position: Semi-fowlers  Patient received lying supine in bed unwilling to participate in mobility initially but eventually agreed. Was independent for bed mobility and required minimal assistance to standing position. Ambulated in hallway at min guard with slow steady gait. Required standing rest break x1. Tolerated ambulation well without complaint or incident and was left lying supine in bed with all needs met.   03/17/2021 2:02 PM

## 2021-03-17 NOTE — Progress Notes (Addendum)
Heart Failure Nurse Navigator Progress Note  Changed HV TOC appt back as pt plans to DC to IP rehab.     Change in plan to DC home with [facility] therapies. Change HV TOC appt to 11/1 @ 12pm.   Ozella Rocks, MSN, RN Heart Failure Nurse Navigator [phone]

## 2021-03-17 NOTE — Discharge Summary (Signed)
Physician Discharge Summary   Patient ID: Thomas West MRN: [id number] DOB/AGE: 1961-01-28 60 y.o.  Admit date: 03/08/2021 Discharge date: 03/17/2021  Primary Care Physician:  [name], [name], MD   Recommendations for Outpatient Follow-up:  Follow up with PCP in 1-2 weeks Please obtain BMP at the time of appointment  Home Health: Home health PT OT, RN Equipment/Devices: DME 3n 1, Rollator,  Discharge Condition: stable  CODE STATUS: FULL   Diet recommendation: Carb modified diet, low-salt   Discharge Diagnoses:     Acute on chronic systolic CHF (congestive heart failure) (HCC) Acute metabolic encephalopathy with dizziness, gait instability  Type 2 diabetes mellitus with diabetic neuropathy, unspecified (HCC)  Hypertensive urgency  CAD  Acute kidney injury on CKD stage IIIa   HLD (hyperlipidemia)   Consults:   Cardiology Inpatient rehab    Allergies:   Allergies  Allergen Reactions   Bee Venom     swelling     DISCHARGE MEDICATIONS: Allergies as of 03/17/2021       Reactions   Bee Venom    swelling        Medication List     STOP taking these medications    Basaglar KwikPen 100 UNIT/ML   docusate sodium 100 MG capsule Commonly known as: COLACE   Entresto 24-26 MG Generic drug: sacubitril-valsartan   gabapentin 100 MG capsule Commonly known as: NEURONTIN   ibuprofen 200 MG tablet Commonly known as: ADVIL   metFORMIN 500 MG tablet Commonly known as: GLUCOPHAGE   Pen Needles 32G X 4 MM Misc       TAKE these medications    aspirin EC 81 MG tablet Take 1 tablet (81 mg total) by mouth daily.   atorvastatin 80 MG tablet Commonly known as: LIPITOR Take 1 tablet (80 mg total) by mouth daily.   carvedilol 25 MG tablet Commonly known as: COREG TAKE ONE TABLET BY MOUTH EVERY 12 HOURS (10AM &10PM)   clopidogrel 75 MG tablet Commonly known as: PLAVIX Take 1 tablet (75 mg total) by mouth daily.   empagliflozin 10 MG Tabs  tablet Commonly known as: JARDIANCE Take 1 tablet (10 mg total) by mouth daily before breakfast.   furosemide 40 MG tablet Commonly known as: LASIX Take 1 tablet (40 mg total) by mouth daily. What changed:  medication strength how much to take   glipiZIDE 10 MG 24 hr tablet Commonly known as: GLUCOTROL XL Take 1 tablet (10 mg total) by mouth daily with breakfast.   hydrALAZINE 50 MG tablet Commonly known as: APRESOLINE TAKE 1 TABLET(50 MG) BY MOUTH THREE TIMES DAILY   isosorbide mononitrate 60 MG 24 hr tablet Commonly known as: IMDUR Take 1 tablet (60 mg total) by mouth daily.   polyethylene glycol 17 g packet Commonly known as: MIRALAX / GLYCOLAX Take 17 g by mouth daily.   Semaglutide 3 MG Tabs Take 3 mg by mouth daily.               Durable Medical Equipment  (From admission, onward)           Start     Ordered   03/17/21 1504  For home use only DME 3 n 1  Once        03/17/21 1503   03/17/21 1503  For home use only DME 4 wheeled rolling walker with seat  Once       Question:  Patient needs a walker to treat with the following condition  Answer:  CHF (congestive  heart failure) (HCC)   03/17/21 1503             Brief H and P: For complete details please refer to admission H and P, but in brief 60 year old male with history of systolic CHF, CAD, DM-2, HTN, CVA and CKD-3A presenting with progressive edema, orthopnea and dyspnea on exertion for about 2 weeks.  Patient moved from [place] to [place] about a year ago.  He has not established care with PCP, and has not been able to fill his medications.  He is not sure about taking diuretics.  He also admits to taking ibuprofen twice daily.  Hypertensive to 180/120s.  BNP elevated to 3600.  Admitted for acute on chronic systolic CHF with marked anasarca, and started on IV Lasix.   Hospital Course:     Acute on chronic combined systolic and diastolic CHF -Patient presented with progressive edema,  orthopnea, dyspnea on exertion, volume overload, elevated BNP -Initially placed on IV Lasix, now transitioned to oral Lasix 40 mg daily, negative balance 17.4 L -2D echo showed EF of 20 to 25% with severely decreased LV function, global hypokinesis, grade 2DD -Underwent cardiac cath on 10/19, mild obstructive CAD with patent RPLV stent -Entresto held due to renal insufficiency Continue Lasix, Coreg, Imdur, hydralazine  Acute metabolic encephalopathy with dizziness, gait instability, -Resolved, work-up negative.  CT head showed no acute stroke, was evaluated by neurology. -Neurontin discontinued, B12, folate, B1, ammonia level normal.   -No acute issues, alert and oriented, appears at his baseline     Hypertensive urgency -BP now stable, continue Coreg, hydralazine, Imdur, Lasix     History of MI (myocardial infarction), CAD -Has been noncompliant,  -Continue aspirin, Plavix, statin    Type 2 diabetes mellitus with diabetic neuropathy, unspecified (HCC) -Uncontrolled, hemoglobin A1c> 14.0 -Patient had not been taking insulin, compliance remains an issue -Placed on Jardiance, glipizide XL, and semaglutide 3 mg  PO daily.  Please follow HbA1c and further adjustments outpatient.   AKI on CKD stage IIIa -Baseline creatinine 1.4-1.5 -Creatinine trended up to 1.8 likely due to diuresis, Entresto, now improving to 1.6 -Entresto currently on hold, follow BMET outpatient  Generalized debility -Overall improving, inpatient rehab was declined by insurance.  Patient will be discharged home with home health PT OT, RN for medication management.  DME for 3n 1, commode and Rollator  Day of Discharge S: Overall improving, no chest pain, shortness of breath, feels closer to his baseline  BP 125/81 (BP Location: Left Arm)   Pulse 69   Temp 98.7 F (37.1 C) (Oral)   Resp (!) 21   Ht 5\' 9"  (1.753 m)   Wt 69 kg   SpO2 96%   BMI 22.46 kg/m   Physical Exam: General: Alert and awake oriented  x3 not in any acute distress. CVS: S1-S2 clear no murmur rubs or gallops Chest: clear to auscultation bilaterally, no wheezing rales or rhonchi Abdomen: soft nontender, nondistended, normal bowel sounds Extremities: no cyanosis, clubbing or edema noted bilaterally     Get Medicines reviewed and adjusted: Please take all your medications with you for your next visit with your Primary MD  Please request your Primary MD to go over all hospital tests and procedure/radiological results at the follow up. Please ask your Primary MD to get all Hospital records sent to his/her office.  If you experience worsening of your admission symptoms, develop shortness of breath, life threatening emergency, suicidal or homicidal thoughts you must seek medical attention  immediately by calling 911 or calling your MD immediately  if symptoms less severe.  You must read complete instructions/literature along with all the possible adverse reactions/side effects for all the Medicines you take and that have been prescribed to you. Take any new Medicines after you have completely understood and accept all the possible adverse reactions/side effects.   Do not drive when taking pain medications.   Do not take more than prescribed Pain, Sleep and Anxiety Medications  Special Instructions: If you have smoked or chewed Tobacco  in the last 2 yrs please stop smoking, stop any regular Alcohol  and or any Recreational drug use.  Wear Seat belts while driving.  Please note  You were cared for by a hospitalist during your hospital stay. Once you are discharged, your primary care physician will handle any further medical issues. Please note that NO REFILLS for any discharge medications will be authorized once you are discharged, as it is imperative that you return to your primary care physician (or establish a relationship with a primary care physician if you do not have one) for your aftercare needs so that they can reassess  your need for medications and monitor your lab values.   The results of significant diagnostics from this hospitalization (including imaging, microbiology, ancillary and laboratory) are listed below for reference.      Procedures/Studies:  DG Chest 2 View  Result Date: 03/08/2021 CLINICAL DATA:  Shortness of breath EXAM: CHEST - 2 VIEW COMPARISON:  02/06/2018 FINDINGS: Cardiac shadow is enlarged but stable. Aortic calcifications are seen. Small pleural effusions are noted bilaterally. Focal airspace opacity is noted in the medial right lung base projecting in the right lower lobe. No bony abnormality is noted. IMPRESSION: Right lower lobe infiltrate with small pleural effusions bilaterally. Electronically Signed   By: Alcide Clever M.D.   On: 03/08/2021 03:31   CARDIAC CATHETERIZATION  Result Date: 03/11/2021   Dist RCA lesion is 20% stenosed.   Prox LAD lesion is 30% stenosed.   Dist LAD lesion is 70% stenosed.   2nd Mrg lesion is 60% stenosed.   Previously placed RPAV stent (unknown type) is  widely patent.   LV end diastolic pressure is normal.   LV end diastolic pressure is normal.   Hemodynamic findings consistent with mild pulmonary hypertension. 1.  Mild obstructive coronary artery disease with patent RPLV stent. 2.  Normal cardiac output and index with mean right atrial pressure of 5 mmHg and mean wedge pressure of 5 mmHg.   ECHOCARDIOGRAM COMPLETE  Result Date: 03/09/2021    ECHOCARDIOGRAM REPORT   Patient Name:   Thomas West Date of Exam: 03/09/2021 Medical Rec #:  [id number]     Height:       69.0 in Accession #:    [id number]    Weight:       161.0 lb Date of Birth:  12-Oct-1960     BSA:          1.884 m Patient Age:    59 years      BP:           115/98 mmHg Patient Gender: M             HR:           64 bpm. Exam Location:  Inpatient Procedure: 2D Echo, Cardiac Doppler and Color Doppler Indications:    CHF- Acute systolic  History:        Patient has no prior history of  Echocardiogram examinations.                 CHF, CAD and Acute MI, Stroke; Risk Factors:Diabetes and                 Hypertension.  Sonographer:    Gertie Fey MHA, RDMS, RVT, RDCS Referring Phys: 63 JARED M GARDNER IMPRESSIONS  1. Left ventricular ejection fraction, by estimation, is 20 to 25%. The left ventricle has severely decreased function. The left ventricle demonstrates global hypokinesis. Left ventricular diastolic parameters are consistent with Grade II diastolic dysfunction (pseudonormalization).  2. Right ventricular systolic function is normal. The right ventricular size is mildly enlarged. There is normal pulmonary artery systolic pressure. The estimated right ventricular systolic pressure is 24.0 mmHg.  3. Right atrial size was mildly dilated.  4. The mitral valve is normal in structure. Trivial mitral valve regurgitation. No evidence of mitral stenosis.  5. Tricuspid valve regurgitation is moderate.  6. The aortic valve is tricuspid. Aortic valve regurgitation is not visualized. Mild aortic valve sclerosis is present, with no evidence of aortic valve stenosis.  7. The inferior vena cava is normal in size with greater than 50% respiratory variability, suggesting right atrial pressure of 3 mmHg. FINDINGS  Left Ventricle: Left ventricular ejection fraction, by estimation, is 20 to 25%. The left ventricle has severely decreased function. The left ventricle demonstrates global hypokinesis. The left ventricular internal cavity size was normal in size. There is no left ventricular hypertrophy. Left ventricular diastolic parameters are consistent with Grade II diastolic dysfunction (pseudonormalization). Right Ventricle: The right ventricular size is mildly enlarged. No increase in right ventricular wall thickness. Right ventricular systolic function is normal. There is normal pulmonary artery systolic pressure. The tricuspid regurgitant velocity is 2.29  m/s, and with an assumed right atrial  pressure of 3 mmHg, the estimated right ventricular systolic pressure is 24.0 mmHg. Left Atrium: Left atrial size was normal in size. Right Atrium: Right atrial size was mildly dilated. Pericardium: There is no evidence of pericardial effusion. Mitral Valve: The mitral valve is normal in structure. There is mild thickening of the mitral valve leaflet(s). There is mild calcification of the mitral valve leaflet(s). Trivial mitral valve regurgitation. No evidence of mitral valve stenosis. Tricuspid Valve: The tricuspid valve is normal in structure. Tricuspid valve regurgitation is moderate . No evidence of tricuspid stenosis. Aortic Valve: The aortic valve is tricuspid. Aortic valve regurgitation is not visualized. Mild aortic valve sclerosis is present, with no evidence of aortic valve stenosis. Aortic valve mean gradient measures 2.5 mmHg. Aortic valve peak gradient measures 4.8 mmHg. Aortic valve area, by VTI measures 1.58 cm. Pulmonic Valve: The pulmonic valve was normal in structure. Pulmonic valve regurgitation is not visualized. No evidence of pulmonic stenosis. Aorta: The aortic root is normal in size and structure. Venous: The inferior vena cava is normal in size with greater than 50% respiratory variability, suggesting right atrial pressure of 3 mmHg. IAS/Shunts: No atrial level shunt detected by color flow Doppler.  LEFT VENTRICLE PLAX 2D LVIDd:         4.90 cm      Diastology LVIDs:         4.50 cm      LV e' medial:    4.19 cm/s LV PW:         1.00 cm      LV E/e' medial:  11.3 LV IVS:        1.00 cm  LV e' lateral:   6.53 cm/s LVOT diam:     2.00 cm      LV E/e' lateral: 7.3 LV SV:         36 LV SV Index:   19 LVOT Area:     3.14 cm  LV Volumes (MOD) LV vol d, MOD A2C: 124.0 ml LV vol d, MOD A4C: 99.9 ml LV vol s, MOD A2C: 92.8 ml LV vol s, MOD A4C: 79.9 ml LV SV MOD A2C:     31.2 ml LV SV MOD A4C:     99.9 ml LV SV MOD BP:      25.6 ml RIGHT VENTRICLE RV S prime:     5.36 cm/s TAPSE (M-mode): 1.3  cm LEFT ATRIUM             Index        RIGHT ATRIUM           Index LA diam:        3.90 cm 2.07 cm/m   RA Area:     21.30 cm LA Vol (A2C):   51.5 ml 27.33 ml/m  RA Volume:   66.20 ml  35.14 ml/m LA Vol (A4C):   56.1 ml 29.78 ml/m LA Biplane Vol: 56.7 ml 30.09 ml/m  AORTIC VALVE AV Area (Vmax):    1.97 cm AV Area (Vmean):   1.83 cm AV Area (VTI):     1.58 cm AV Vmax:           109.00 cm/s AV Vmean:          72.500 cm/s AV VTI:            0.227 m AV Peak Grad:      4.8 mmHg AV Mean Grad:      2.5 mmHg LVOT Vmax:         68.40 cm/s LVOT Vmean:        42.200 cm/s LVOT VTI:          0.114 m LVOT/AV VTI ratio: 0.50  AORTA Ao Root diam: 3.00 cm MITRAL VALVE               TRICUSPID VALVE MV Area (PHT): 4.71 cm    TR Peak grad:   21.0 mmHg MV Decel Time: 161 msec    TR Vmax:        229.00 cm/s MV E velocity: 47.50 cm/s MV A velocity: 39.55 cm/s  SHUNTS MV E/A ratio:  1.20        Systemic VTI:  0.11 m                            Systemic Diam: 2.00 cm Donato Schultz MD Electronically signed by Donato Schultz MD Signature Date/Time: 03/09/2021/1:53:07 PM    Final    CT HEAD CODE STROKE WO CONTRAST  Result Date: 03/12/2021 CLINICAL DATA:  Code stroke.  Right-sided weakness EXAM: CT HEAD WITHOUT CONTRAST TECHNIQUE: Contiguous axial images were obtained from the base of the skull through the vertex without intravenous contrast. COMPARISON:  None. FINDINGS: Brain: There is no acute intracranial hemorrhage, mass effect, or edema. Gray-white differentiation is preserved. Patchy low-density in the supratentorial white matter is nonspecific but probably reflects mild to moderate chronic microvascular ischemic changes. Subcentimeter hyperdensity along the left aspect of the tentorium (series 6, image 21) is most consistent with a small meningioma. Vascular: No hyperdense vessel. There is intracranial atherosclerotic calcification at the skull  base. Skull: Unremarkable. Sinuses/Orbits: Patchy paranasal sinus mucosal  thickening. Orbits are unremarkable. Other: Mastoid air cells are clear. Right posterior maxillary molar periapical lucency. ASPECTS (Alberta Stroke Program Early CT Score) - Ganglionic level infarction (caudate, lentiform nuclei, internal capsule, insula, M1-M3 cortex): 7 - Supraganglionic infarction (M4-M6 cortex): 3 Total score (0-10 with 10 being normal): 10 IMPRESSION: There is no acute intracranial hemorrhage or evidence of acute infarction. ASPECT score is 10. Chronic microvascular ischemic changes. Small subcentimeter meningioma of the tentorium. These results were communicated to Dr. Otelia Limes at 3:08 pm on 03/12/2021 by text page via the [facility] messaging system. Electronically Signed   By: Guadlupe Spanish M.D.   On: 03/12/2021 15:11      LAB RESULTS: Basic Metabolic Panel: Recent Labs  Lab 03/11/21 0406 03/11/21 0959 03/13/21 0312 03/16/21 0503  NA 143   < > 140 138  K 3.8   < > 4.1 4.4  CL 99   < > 103 106  CO2 33*   < > 31 23  GLUCOSE 100*   < > 137* 176*  BUN 19   < > 22* 21*  CREATININE 1.65*   < > 1.72* 1.68*  CALCIUM 8.1*   < > 8.0* 8.1*  MG 1.8  --   --   --    < > = values in this interval not displayed.   Liver Function Tests: No results for input(s): AST, ALT, ALKPHOS, BILITOT, PROT, ALBUMIN in the last 168 hours. No results for input(s): LIPASE, AMYLASE in the last 168 hours. Recent Labs  Lab 03/12/21 1532  AMMONIA 31   CBC: Recent Labs  Lab 03/11/21 0406 03/11/21 0959 03/11/21 1006 03/11/21 1008  WBC 7.2  --   --   --   HGB 12.7*   < > 13.6 13.3  HCT 38.7*   < > 40.0 39.0  MCV 84.7  --   --   --   PLT 267  --   --   --    < > = values in this interval not displayed.   Cardiac Enzymes: No results for input(s): CKTOTAL, CKMB, CKMBINDEX, TROPONINI in the last 168 hours. BNP: Invalid input(s): POCBNP CBG: Recent Labs  Lab 03/17/21 0554 03/17/21 1117  GLUCAP 100* 113*       Disposition and Follow-up: Discharge Instructions     (HEART FAILURE  PATIENTS) Call MD:  Anytime you have any of the following symptoms: 1) 3 pound weight gain in 24 hours or 5 pounds in 1 week 2) shortness of breath, with or without a dry hacking cough 3) swelling in the hands, feet or stomach 4) if you have to sleep on extra pillows at night in order to breathe.   Complete by: As directed    Diet Carb Modified   Complete by: As directed    Increase activity slowly   Complete by: As directed         DISPOSITION: Home health PT OT, RN   DISCHARGE FOLLOW-UP  Follow-up Information     [facility]. Go to.   Specialty: Cardiology Why: Monday 11/7 @ 11AM for HV TOC appt [facility]. Bring all medications with you to your appointment.  FREE valet parking at [employer], off [address]. Contact information: [address] [id number] [place] [address] [phone]        [name], [name], MD. Schedule an appointment as soon as possible for  a visit in 2 week(s).   Specialty: Family Medicine Why: for hospital follow-up Contact information: [address] [place] Flake [facility] [place] [address] [phone]         Wendall Stade, MD .   Specialty: Cardiology Contact information: [phone] [address] [place] [address] [phone]                  Time coordinating discharge:  35 mins   Signed:   Thad Ranger M.D. [facility] 03/17/2021, 3:51 PM

## 2021-03-17 NOTE — TOC CM/SW Note (Addendum)
HF TOC CM received notification the IP rehab [facility] was denied by insurance. Updated the attending. Requested HH orders for RN and PT with F2F. Pt will need 3n1 bedside commode and rollator. Contacted [employer] for DME for home. Isidoro Donning RN3 CCM, Heart Failure TOC CM [phone]

## 2021-03-17 NOTE — TOC CM/SW Note (Signed)
HF TOC CM spoke to pt at bedside. States his sister will pick him up at dc. Explained his Rollator and 3n1 bedside commode will be delivered to room. [facility] accepted referral for HHPT. Faxed orders for HHPT.   Isidoro Donning RN3 CCM, Heart Failure TOC CM [phone]   [facility] declined [facility]-declined [facility]-declined [facility] -declined- not in network

## 2021-03-17 NOTE — Progress Notes (Signed)
Inpatient Rehab Admissions Coordinator:   Note therapy recommendations now for home health.  Will sign off at this time for CIR.    Estill Dooms, PT, DPT Admissions Coordinator [phone] 03/17/21  2:51 PM

## 2021-03-17 NOTE — Progress Notes (Signed)
Inpatient Rehab Admissions Coordinator:   Received a denial from insurance for CIR.  Asked PT to see and determine whether he would be a candidate for home and then will discuss with patient whether he would like for me to pursue expedited appeal.   Estill Dooms, PT, DPT Admissions Coordinator [phone] 03/17/21  1:27 PM

## 2021-03-17 NOTE — Progress Notes (Addendum)
Patient expresses that his sister cant pick him up and that she will be able to pick him up tomorrow morning. NS has arranged a taxi to bring the patient home. Patient made aware. Patient made aware that his sister has his belongings and is at his place to open the door for him. Rollator and 3n1 is at the bedside. NT removed PIV. Tele monitor has been removed as well. CCMD has notified. Thurston Hole, RN went over discharge papers with patient.

## 2021-03-17 NOTE — Consult Note (Signed)
   [facility] Inpatient Consult   03/17/2021  Jamey Harman 13-Feb-1961 [id number]  [facility] [AC[facility]] Patient:  [employer] Medicare  Primary Care Provider:  [name], [name], MD [facility] [place] an Embedded provider with a Chronic Care Management team and program  Patient was screened for length of stay noted to be in a [facility]  embedded practice with pcp. Met with the patient at the bedside to explain Chronic Care Management program that he may benefit from if eligible. Patient states he is interest and would like follow up with his primary care provider.   Patient will have the transition of care call conducted by the primary care provider. This patient may benefit from a  chronic care management Embedded Care Management program.   Chart review reveals patient's A1C >14 and HF.  Plan: Following for Embedded Care Management needs for post hospital care management. Referral to be made closer to transition for Embedded CCM follow up.  Please contact for further questions,  Natividad Brood, RN BSN [facility] Liaison  [phone] business mobile phone Toll free office [phone]  Fax number: [phone] [email or web address]

## 2021-03-17 NOTE — Progress Notes (Signed)
Physical Therapy Treatment Patient Details Name: Thomas West MRN: [id number] DOB: November 09, 1960 Today's Date: 03/17/2021   History of Present Illness Gad Aymond is a 60 y.o. male who presented 03/08/21 with progressive edema, orthopnea and dyspnea on exertion for about 2 weeks. S/p cardiac cath 10/19. Pt with change in status 10/20 wtih Code Stroke called but CT negative. Possible acute encephalopathy. Medical history significant of HFrEF, prior MI, DM2, HTN. Pt presents to ED with 2+ weeks of worsening peripheral edema, anasarca.    PT Comments    Patient progressing with ambulation and balance this session.  Able to stand briefly with only walker support while PT working to assist with hygiene due to soiled with incontinent BM, but pt aware soon as it happened.  Patient currently min A to minguard overall for mobility and walking good distance today educated in using rollator for sitting rest.  Feel he should be able to progress to home with family support due to cognitive deficits and follow up HHPT/OT/SLP.  Patient will continue to benefit from skilled PT in the acute setting until d/c.   Recommendations for follow up therapy are one component of a multi-disciplinary discharge planning process, led by the attending physician.  Recommendations may be updated based on patient status, additional functional criteria and insurance authorization.  Follow Up Recommendations  Home health PT     Assistance Recommended at Discharge Frequent or constant Supervision/Assistance  Equipment Recommendations  3in1 (PT);Rollator (4 wheels)    Recommendations for Other Services       Precautions / Restrictions Precautions Precautions: Fall     Mobility  Bed Mobility Overal bed mobility: Needs Assistance Bed Mobility: Sit to Supine     Supine to sit: HOB elevated;Supervision     General bed mobility comments: some assist with lines due to incontinent of stool    Transfers Overall  transfer level: Needs assistance Equipment used: Rolling walker (2 wheels) Transfers: Sit to/from Stand Sit to Stand: Min assist Stand pivot transfers: Min guard         General transfer comment: assist for balance and safety to stand from EOB and for lines as soiled due to stool incontinence, stand step to [facility] with CGA    Ambulation/Gait Ambulation/Gait assistance: Min guard Gait Distance (Feet): 120 Feet (x 2) Assistive device: Rollator (4 wheels) Gait Pattern/deviations: Step-through pattern;Decreased stride length     General Gait Details: assist for safety, cues for rollator use, sat to rest at nursing station then walked back to room   Stairs             Wheelchair Mobility    Modified Rankin (Stroke Patients Only) Modified Rankin (Stroke Patients Only) Pre-Morbid Rankin Score: No symptoms Modified Rankin: Moderately severe disability     Balance Overall balance assessment: Needs assistance Sitting-balance support: Feet supported Sitting balance-Leahy Scale: Good     Standing balance support: Single extremity supported;During functional activity Standing balance-Leahy Scale: Poor Standing balance comment: standing while PT assisted with hygiene, using UE support on walker for balance                            Cognition Arousal/Alertness: Awake/alert Behavior During Therapy: WFL for tasks assessed/performed   Area of Impairment: Attention;Following commands;Safety/judgement;Problem solving                   Current Attention Level: Selective   Following Commands: Follows one step commands with increased time;Follows one step  commands consistently Safety/Judgement: Decreased awareness of deficits   Problem Solving: Slow processing;Decreased initiation          Exercises      General Comments General comments (skin integrity, edema, etc.): VSS with activity      Pertinent Vitals/Pain Pain Assessment: No/denies pain     Home Living                          Prior Function            PT Goals (current goals can now be found in the care plan section) Progress towards PT goals: Progressing toward goals    Frequency    Min 4X/week      PT Plan Discharge plan needs to be updated    Co-evaluation              AM-PAC PT "6 Clicks" Mobility   Outcome Measure  Help needed turning from your back to your side while in a flat bed without using bedrails?: None Help needed moving from lying on your back to sitting on the side of a flat bed without using bedrails?: A Little Help needed moving to and from a bed to a chair (including a wheelchair)?: A Little Help needed standing up from a chair using your arms (e.g., wheelchair or bedside chair)?: A Little Help needed to walk in hospital room?: A Little Help needed climbing 3-5 steps with a railing? : A Little 6 Click Score: 19    End of Session   Activity Tolerance: Patient tolerated treatment well Patient left: in chair;with call bell/phone within reach;with chair alarm set   PT Visit Diagnosis: Other abnormalities of gait and mobility (R26.89);Muscle weakness (generalized) (M62.81);Other symptoms and signs involving the nervous system (R29.898)     Time: 7619-5093 PT Time Calculation (min) (ACUTE ONLY): 37 min  Charges:  $Gait Training: 8-22 mins $Therapeutic Activity: 8-22 mins                     Sheran Lawless, PT Acute Rehabilitation Services [phone] [phone] 03/17/2021    Elray Mcgregor 03/17/2021, 1:42 PM

## 2021-03-19 ENCOUNTER — Encounter (HOSPITAL_COMMUNITY): Payer: Medicare Other

## 2021-03-19 ENCOUNTER — Telehealth: Payer: Self-pay

## 2021-03-19 ENCOUNTER — Telehealth: Payer: Self-pay | Admitting: *Deleted

## 2021-03-19 NOTE — Telephone Encounter (Signed)
Transition Care Management Unsuccessful Follow-up Telephone Call  Date of discharge and from where:  [facility] 10/225/2022  Attempts:  1st Attempt  Reason for unsuccessful TCM follow-up call:  Unable to reach patient

## 2021-03-19 NOTE — Chronic Care Management (AMB) (Signed)
  Chronic Care Management   Outreach Note  03/19/2021 Name: Thomas West MRN: [id number] DOB: [birth year]  Thomas West is a 60 y.o. year old male who is a primary care patient of [name], [name] Expose, MD. I reached out to [name] by phone today in response to a referral sent by Mr. Treon Kehl primary care provider.  An unsuccessful telephone outreach was attempted today. The patient was referred to the case management team for assistance with care management and care coordination.   Follow Up Plan: The care management team will reach out to the patient again over the next 7 days.  If patient returns call to provider office, please advise to call Embedded Care Management Care Guide Misty Stanley at [phone].  Gwenevere Ghazi  Care Guide, Embedded Care Coordination [facility] Management  Direct Dial: [phone]

## 2021-03-20 NOTE — Telephone Encounter (Signed)
Transition Care Management Unsuccessful Follow-up Telephone Call  Date of discharge and from where:  [facility] 03/17/[facility]  Attempts:  2nd Attempt  Reason for unsuccessful TCM follow-up call:  Unable to leave message

## 2021-03-23 ENCOUNTER — Telehealth: Payer: Self-pay

## 2021-03-23 DIAGNOSIS — I5020 Unspecified systolic (congestive) heart failure: Secondary | ICD-10-CM

## 2021-03-23 DIAGNOSIS — I5023 Acute on chronic systolic (congestive) heart failure: Secondary | ICD-10-CM

## 2021-03-23 NOTE — Addendum Note (Signed)
Addended by: Weyman Croon E on: 03/23/2021 09:01 AM   Modules accepted: Orders

## 2021-03-23 NOTE — Telephone Encounter (Signed)
New referral placed.

## 2021-03-23 NOTE — Telephone Encounter (Signed)
Chava from [facility] called stating that they do not have the staff to work with patient at this time and asked if he could be place elsewhere. Call back # [phone]

## 2021-03-24 ENCOUNTER — Telehealth (HOSPITAL_COMMUNITY): Payer: Self-pay

## 2021-03-24 ENCOUNTER — Encounter (HOSPITAL_COMMUNITY): Payer: Medicare Other

## 2021-03-24 NOTE — Telephone Encounter (Signed)
Attempted to call to remind patient of HV TOC appt today at 12noon. No opportunity to leave vm. No other numbers listed.  Ozella Rocks, MSN, RN Heart Failure Nurse Navigator [phone]

## 2021-03-26 NOTE — Chronic Care Management (AMB) (Signed)
  Chronic Care Management   Outreach Note  03/26/2021 Name: Thomas West MRN: [id number] DOB: May 12, 1961  Thomas West is a 60 y.o. year old male who is a primary care patient of [name], [name] Expose, MD. I reached out to [name] by phone today in response to a referral sent by Mr. Mylin Hirano primary care provider.  A second unsuccessful telephone outreach was attempted today. The patient was referred to the case management team for assistance with care management and care coordination.   Follow Up Plan: The care management team will reach out to the patient again over the next 7 days.  If patient returns call to provider office, please advise to call Embedded Care Management Care Guide Misty Stanley at [phone].  Gwenevere Ghazi  Care Guide, Embedded Care Coordination [facility] Management  Direct Dial: [phone]

## 2021-03-30 ENCOUNTER — Encounter (HOSPITAL_COMMUNITY): Payer: Medicare Other

## 2021-04-03 NOTE — Chronic Care Management (AMB) (Signed)
  Chronic Care Management   Outreach Note  04/03/2021 Name: Thomas West MRN: [id number] DOB: 1961/04/24  Thomas West is a 60 y.o. year old male who is a primary care patient of [name], [name] Expose, MD. I reached out to [name] by phone today in response to a referral sent by [name] primary care provider.  Third unsuccessful telephone outreach was attempted today. The patient was referred to the case management team for assistance with care management and care coordination. The patient's primary care provider has been notified of our unsuccessful attempts to make or maintain contact with the patient. The care management team is pleased to engage with this patient at any time in the future should he/she be interested in assistance from the care management team.   Follow Up Plan: We have been unable to make contact with the patient for follow up. The care management team is available to follow up with the patient after provider conversation with the patient regarding recommendation for care management engagement and subsequent re-referral to the care management team.   [facility] Guide, Embedded Care Coordination [facility]  Care Management  Direct Dial: [phone]

## 2021-04-19 ENCOUNTER — Inpatient Hospital Stay (HOSPITAL_COMMUNITY)
Admission: EM | Admit: 2021-04-19 | Discharge: 2021-04-26 | DRG: 291 | Disposition: A | Discharge status: Home Health | Payer: Medicare Other | Attending: Internal Medicine | Admitting: Internal Medicine

## 2021-04-19 DIAGNOSIS — I5023 Acute on chronic systolic (congestive) heart failure: Secondary | ICD-10-CM

## 2021-04-19 DIAGNOSIS — I130 Hypertensive heart and chronic kidney disease with heart failure and stage 1 through stage 4 chronic kidney disease, or unspecified chronic kidney disease: Secondary | ICD-10-CM | POA: Diagnosis not present

## 2021-04-19 DIAGNOSIS — E119 Type 2 diabetes mellitus without complications: Secondary | ICD-10-CM

## 2021-04-19 DIAGNOSIS — Z9114 Patient's other noncompliance with medication regimen: Secondary | ICD-10-CM

## 2021-04-19 DIAGNOSIS — N1831 Chronic kidney disease, stage 3a: Secondary | ICD-10-CM | POA: Diagnosis present

## 2021-04-19 DIAGNOSIS — Z7982 Long term (current) use of aspirin: Secondary | ICD-10-CM

## 2021-04-19 DIAGNOSIS — R748 Abnormal levels of other serum enzymes: Secondary | ICD-10-CM | POA: Diagnosis present

## 2021-04-19 DIAGNOSIS — Z87891 Personal history of nicotine dependence: Secondary | ICD-10-CM

## 2021-04-19 DIAGNOSIS — I5043 Acute on chronic combined systolic (congestive) and diastolic (congestive) heart failure: Secondary | ICD-10-CM | POA: Diagnosis present

## 2021-04-19 DIAGNOSIS — I252 Old myocardial infarction: Secondary | ICD-10-CM

## 2021-04-19 DIAGNOSIS — E1122 Type 2 diabetes mellitus with diabetic chronic kidney disease: Secondary | ICD-10-CM | POA: Diagnosis present

## 2021-04-19 DIAGNOSIS — Z79899 Other long term (current) drug therapy: Secondary | ICD-10-CM

## 2021-04-19 DIAGNOSIS — Z8673 Personal history of transient ischemic attack (TIA), and cerebral infarction without residual deficits: Secondary | ICD-10-CM

## 2021-04-19 DIAGNOSIS — Z7984 Long term (current) use of oral hypoglycemic drugs: Secondary | ICD-10-CM

## 2021-04-19 DIAGNOSIS — I160 Hypertensive urgency: Secondary | ICD-10-CM | POA: Diagnosis present

## 2021-04-19 DIAGNOSIS — R7989 Other specified abnormal findings of blood chemistry: Secondary | ICD-10-CM

## 2021-04-19 DIAGNOSIS — Z20822 Contact with and (suspected) exposure to covid-19: Secondary | ICD-10-CM | POA: Diagnosis present

## 2021-04-19 DIAGNOSIS — I255 Ischemic cardiomyopathy: Secondary | ICD-10-CM | POA: Diagnosis present

## 2021-04-19 DIAGNOSIS — I509 Heart failure, unspecified: Secondary | ICD-10-CM | POA: Diagnosis not present

## 2021-04-19 DIAGNOSIS — I5020 Unspecified systolic (congestive) heart failure: Secondary | ICD-10-CM

## 2021-04-19 DIAGNOSIS — Z91030 Bee allergy status: Secondary | ICD-10-CM

## 2021-04-19 DIAGNOSIS — Z7902 Long term (current) use of antithrombotics/antiplatelets: Secondary | ICD-10-CM

## 2021-04-19 DIAGNOSIS — I2510 Atherosclerotic heart disease of native coronary artery without angina pectoris: Secondary | ICD-10-CM | POA: Diagnosis present

## 2021-04-19 DIAGNOSIS — L89626 Pressure-induced deep tissue damage of left heel: Secondary | ICD-10-CM | POA: Diagnosis present

## 2021-04-19 DIAGNOSIS — Z91199 Patient's noncompliance with other medical treatment and regimen due to unspecified reason: Secondary | ICD-10-CM

## 2021-04-19 DIAGNOSIS — E785 Hyperlipidemia, unspecified: Secondary | ICD-10-CM | POA: Diagnosis present

## 2021-04-19 DIAGNOSIS — I493 Ventricular premature depolarization: Secondary | ICD-10-CM | POA: Diagnosis present

## 2021-04-19 DIAGNOSIS — I10 Essential (primary) hypertension: Secondary | ICD-10-CM

## 2021-04-20 ENCOUNTER — Observation Stay (HOSPITAL_COMMUNITY): Payer: Medicare Other

## 2021-04-20 ENCOUNTER — Emergency Department (HOSPITAL_COMMUNITY): Payer: Medicare Other

## 2021-04-20 ENCOUNTER — Inpatient Hospital Stay: Payer: Self-pay

## 2021-04-20 ENCOUNTER — Encounter (HOSPITAL_COMMUNITY): Payer: Self-pay | Admitting: *Deleted

## 2021-04-20 ENCOUNTER — Other Ambulatory Visit: Payer: Self-pay

## 2021-04-20 DIAGNOSIS — I130 Hypertensive heart and chronic kidney disease with heart failure and stage 1 through stage 4 chronic kidney disease, or unspecified chronic kidney disease: Secondary | ICD-10-CM | POA: Diagnosis present

## 2021-04-20 DIAGNOSIS — I2510 Atherosclerotic heart disease of native coronary artery without angina pectoris: Secondary | ICD-10-CM | POA: Diagnosis present

## 2021-04-20 DIAGNOSIS — E785 Hyperlipidemia, unspecified: Secondary | ICD-10-CM | POA: Diagnosis present

## 2021-04-20 DIAGNOSIS — I5043 Acute on chronic combined systolic (congestive) and diastolic (congestive) heart failure: Secondary | ICD-10-CM | POA: Diagnosis present

## 2021-04-20 DIAGNOSIS — I252 Old myocardial infarction: Secondary | ICD-10-CM | POA: Diagnosis not present

## 2021-04-20 DIAGNOSIS — E1169 Type 2 diabetes mellitus with other specified complication: Secondary | ICD-10-CM

## 2021-04-20 DIAGNOSIS — I255 Ischemic cardiomyopathy: Secondary | ICD-10-CM | POA: Diagnosis present

## 2021-04-20 DIAGNOSIS — Z91199 Patient's noncompliance with other medical treatment and regimen due to unspecified reason: Secondary | ICD-10-CM | POA: Diagnosis not present

## 2021-04-20 DIAGNOSIS — I493 Ventricular premature depolarization: Secondary | ICD-10-CM | POA: Diagnosis present

## 2021-04-20 DIAGNOSIS — I160 Hypertensive urgency: Secondary | ICD-10-CM | POA: Diagnosis present

## 2021-04-20 DIAGNOSIS — I10 Essential (primary) hypertension: Secondary | ICD-10-CM | POA: Diagnosis not present

## 2021-04-20 DIAGNOSIS — N1831 Chronic kidney disease, stage 3a: Secondary | ICD-10-CM | POA: Diagnosis present

## 2021-04-20 DIAGNOSIS — Z91030 Bee allergy status: Secondary | ICD-10-CM | POA: Diagnosis not present

## 2021-04-20 DIAGNOSIS — Z9114 Patient's other noncompliance with medication regimen: Secondary | ICD-10-CM | POA: Diagnosis not present

## 2021-04-20 DIAGNOSIS — Z7902 Long term (current) use of antithrombotics/antiplatelets: Secondary | ICD-10-CM | POA: Diagnosis not present

## 2021-04-20 DIAGNOSIS — Z8673 Personal history of transient ischemic attack (TIA), and cerebral infarction without residual deficits: Secondary | ICD-10-CM | POA: Diagnosis not present

## 2021-04-20 DIAGNOSIS — E1122 Type 2 diabetes mellitus with diabetic chronic kidney disease: Secondary | ICD-10-CM | POA: Diagnosis present

## 2021-04-20 DIAGNOSIS — R609 Edema, unspecified: Secondary | ICD-10-CM | POA: Diagnosis not present

## 2021-04-20 DIAGNOSIS — Z20822 Contact with and (suspected) exposure to covid-19: Secondary | ICD-10-CM | POA: Diagnosis present

## 2021-04-20 DIAGNOSIS — Z7984 Long term (current) use of oral hypoglycemic drugs: Secondary | ICD-10-CM | POA: Diagnosis not present

## 2021-04-20 DIAGNOSIS — L89626 Pressure-induced deep tissue damage of left heel: Secondary | ICD-10-CM | POA: Diagnosis present

## 2021-04-20 DIAGNOSIS — I5023 Acute on chronic systolic (congestive) heart failure: Secondary | ICD-10-CM | POA: Diagnosis not present

## 2021-04-20 DIAGNOSIS — I509 Heart failure, unspecified: Secondary | ICD-10-CM | POA: Diagnosis present

## 2021-04-20 DIAGNOSIS — Z7982 Long term (current) use of aspirin: Secondary | ICD-10-CM | POA: Diagnosis not present

## 2021-04-20 DIAGNOSIS — Z87891 Personal history of nicotine dependence: Secondary | ICD-10-CM | POA: Diagnosis not present

## 2021-04-20 DIAGNOSIS — Z79899 Other long term (current) drug therapy: Secondary | ICD-10-CM | POA: Diagnosis not present

## 2021-04-20 DIAGNOSIS — R748 Abnormal levels of other serum enzymes: Secondary | ICD-10-CM | POA: Diagnosis present

## 2021-04-20 LAB — COMPREHENSIVE METABOLIC PANEL
ALT: 16 U/L (ref 0–44)
AST: 23 U/L (ref 15–41)
Albumin: 2.7 g/dL — ABNORMAL LOW (ref 3.5–5.0)
Alkaline Phosphatase: 204 U/L — ABNORMAL HIGH (ref 38–126)
Anion gap: 8 (ref 5–15)
BUN: 19 mg/dL (ref 6–20)
CO2: 28 mmol/L (ref 22–32)
Calcium: 8.2 mg/dL — ABNORMAL LOW (ref 8.9–10.3)
Chloride: 102 mmol/L (ref 98–111)
Creatinine, Ser: 1.5 mg/dL — ABNORMAL HIGH (ref 0.61–1.24)
GFR, Estimated: 54 mL/min — ABNORMAL LOW (ref >60)
Glucose, Bld: 182 mg/dL — ABNORMAL HIGH (ref 70–99)
Potassium: 4.1 mmol/L (ref 3.5–5.1)
Sodium: 138 mmol/L (ref 135–145)
Total Bilirubin: 1.6 mg/dL — ABNORMAL HIGH (ref 0.3–1.2)
Total Protein: 6.3 g/dL — ABNORMAL LOW (ref 6.5–8.1)

## 2021-04-20 LAB — CBC WITH DIFFERENTIAL/PLATELET
Abs Immature Granulocytes: 0.03 10*3/uL (ref 0.00–0.07)
Basophils Absolute: 0.1 10*3/uL (ref 0.0–0.1)
Basophils Relative: 1 %
Eosinophils Absolute: 0.2 10*3/uL (ref 0.0–0.5)
Eosinophils Relative: 4 %
HCT: 41.4 % (ref 39.0–52.0)
Hemoglobin: 13.5 g/dL (ref 13.0–17.0)
Immature Granulocytes: 1 %
Lymphocytes Relative: 26 %
Lymphs Abs: 1.6 10*3/uL (ref 0.7–4.0)
MCH: 27.4 pg (ref 26.0–34.0)
MCHC: 32.6 g/dL (ref 30.0–36.0)
MCV: 84.1 fL (ref 80.0–100.0)
Monocytes Absolute: 0.7 10*3/uL (ref 0.1–1.0)
Monocytes Relative: 12 %
Neutro Abs: 3.5 10*3/uL (ref 1.7–7.7)
Neutrophils Relative %: 56 %
Platelets: 326 10*3/uL (ref 150–400)
RBC: 4.92 MIL/uL (ref 4.22–5.81)
RDW: 15.8 % — ABNORMAL HIGH (ref 11.5–15.5)
WBC: 6.2 10*3/uL (ref 4.0–10.5)
nRBC: 0 % (ref 0.0–0.2)

## 2021-04-20 LAB — BASIC METABOLIC PANEL
Anion gap: 8 (ref 5–15)
BUN: 18 mg/dL (ref 6–20)
CO2: 28 mmol/L (ref 22–32)
Calcium: 8.2 mg/dL — ABNORMAL LOW (ref 8.9–10.3)
Chloride: 102 mmol/L (ref 98–111)
Creatinine, Ser: 1.43 mg/dL — ABNORMAL HIGH (ref 0.61–1.24)
GFR, Estimated: 57 mL/min — ABNORMAL LOW (ref >60)
Glucose, Bld: 200 mg/dL — ABNORMAL HIGH (ref 70–99)
Potassium: 4.5 mmol/L (ref 3.5–5.1)
Sodium: 138 mmol/L (ref 135–145)

## 2021-04-20 LAB — RAPID URINE DRUG SCREEN, HOSP PERFORMED
Amphetamines: NOT DETECTED
Barbiturates: NOT DETECTED
Benzodiazepines: NOT DETECTED
Cocaine: NOT DETECTED
Opiates: NOT DETECTED
Tetrahydrocannabinol: NOT DETECTED

## 2021-04-20 LAB — COOXEMETRY PANEL
Carboxyhemoglobin: 1.8 % — ABNORMAL HIGH (ref 0.5–1.5)
Carboxyhemoglobin: 1.3 % (ref 0.5–1.5)
Methemoglobin: 0.9 % (ref 0.0–1.5)
Methemoglobin: 0.9 % (ref 0.0–1.5)
O2 Saturation: 72 %
O2 Saturation: 61.3 %
Total hemoglobin: 12.6 g/dL (ref 12.0–16.0)
Total hemoglobin: 12.7 g/dL (ref 12.0–16.0)

## 2021-04-20 LAB — TROPONIN I (HIGH SENSITIVITY)
Troponin I (High Sensitivity): 12 ng/L (ref <18)
Troponin I (High Sensitivity): 11 ng/L (ref <18)

## 2021-04-20 LAB — LACTIC ACID, PLASMA: Lactic Acid, Venous: 1.5 mmol/L (ref 0.5–1.9)

## 2021-04-20 LAB — CBG MONITORING, ED
Glucose-Capillary: 146 mg/dL — ABNORMAL HIGH (ref 70–99)
Glucose-Capillary: 190 mg/dL — ABNORMAL HIGH (ref 70–99)
Glucose-Capillary: 205 mg/dL — ABNORMAL HIGH (ref 70–99)

## 2021-04-20 LAB — HEMOGLOBIN A1C
Hgb A1c MFr Bld: 11.7 % — ABNORMAL HIGH (ref 4.8–5.6)
Mean Plasma Glucose: 289 mg/dL

## 2021-04-20 LAB — MAGNESIUM: Magnesium: 1.8 mg/dL (ref 1.7–2.4)

## 2021-04-20 LAB — BRAIN NATRIURETIC PEPTIDE: B Natriuretic Peptide: 3306.4 pg/mL — ABNORMAL HIGH (ref 0.0–100.0)

## 2021-04-20 LAB — RESP PANEL BY RT-PCR (FLU A&B, COVID) ARPGX2
Influenza A by PCR: NEGATIVE
Influenza B by PCR: NEGATIVE
SARS Coronavirus 2 by RT PCR: NEGATIVE

## 2021-04-20 MED ORDER — SODIUM CHLORIDE 0.9% FLUSH: 10.0000 mL | INTRAVENOUS | Status: DC | PRN

## 2021-04-20 MED ORDER — CHLORHEXIDINE GLUCONATE CLOTH 2 % EX PADS
6.0000 | MEDICATED_PAD | Freq: Every day | CUTANEOUS | Status: DC
  Administered 2021-04-21 – 2021-04-25 (×5): 6 via TOPICAL

## 2021-04-20 MED ORDER — ATORVASTATIN CALCIUM 40 MG PO TABS
40.0000 mg | ORAL_TABLET | Freq: Every day | ORAL | Status: DC
  Administered 2021-04-20: 12:00:00 40 mg via ORAL
  Filled 2021-04-20: qty 1

## 2021-04-20 MED ORDER — FUROSEMIDE 10 MG/ML IJ SOLN
60.0000 mg | Freq: Two times a day (BID) | INTRAMUSCULAR | Status: DC
  Administered 2021-04-20 – 2021-04-21 (×3): 60 mg via INTRAVENOUS
  Filled 2021-04-20 (×2): qty 6

## 2021-04-20 MED ORDER — ATORVASTATIN CALCIUM 80 MG PO TABS
80.0000 mg | ORAL_TABLET | Freq: Every evening | ORAL | Status: DC
  Administered 2021-04-21 – 2021-04-25 (×5): 80 mg via ORAL
  Filled 2021-04-20 (×4): qty 1

## 2021-04-20 MED ORDER — MAGNESIUM SULFATE 2 GM/50ML IV SOLN
2.0000 g | Freq: Once | INTRAVENOUS | Status: AC
  Administered 2021-04-20: 15:00:00 2 g via INTRAVENOUS
  Filled 2021-04-20: qty 50

## 2021-04-20 MED ORDER — SACUBITRIL-VALSARTAN 24-26 MG PO TABS
1.0000 | ORAL_TABLET | Freq: Two times a day (BID) | ORAL | Status: DC
  Administered 2021-04-20 – 2021-04-26 (×13): 1 via ORAL
  Filled 2021-04-20 (×13): qty 1

## 2021-04-20 MED ORDER — CARVEDILOL 6.25 MG PO TABS
6.2500 mg | ORAL_TABLET | Freq: Two times a day (BID) | ORAL | Status: DC
  Administered 2021-04-20 – 2021-04-26 (×13): 6.25 mg via ORAL
  Filled 2021-04-20 (×9): qty 1
  Filled 2021-04-20 (×3): qty 2

## 2021-04-20 MED ORDER — ENOXAPARIN SODIUM 40 MG/0.4ML IJ SOSY
40.0000 mg | PREFILLED_SYRINGE | INTRAMUSCULAR | Status: DC
  Filled 2021-04-20 (×5): qty 0.4

## 2021-04-20 MED ORDER — INSULIN ASPART 100 UNIT/ML IJ SOLN
0.0000 [IU] | Freq: Three times a day (TID) | INTRAMUSCULAR | Status: DC
  Administered 2021-04-21: 3 [IU] via SUBCUTANEOUS
  Administered 2021-04-22 – 2021-04-23 (×2): 5 [IU] via SUBCUTANEOUS
  Administered 2021-04-23 – 2021-04-24 (×3): 2 [IU] via SUBCUTANEOUS
  Administered 2021-04-24 (×2): 3 [IU] via SUBCUTANEOUS
  Administered 2021-04-25 (×2): 5 [IU] via SUBCUTANEOUS
  Administered 2021-04-25 – 2021-04-26 (×2): 2 [IU] via SUBCUTANEOUS
  Administered 2021-04-26: 07:00:00 3 [IU] via SUBCUTANEOUS

## 2021-04-20 MED ORDER — ISOSORBIDE MONONITRATE ER 30 MG PO TB24
30.0000 mg | ORAL_TABLET | Freq: Every day | ORAL | Status: DC
  Administered 2021-04-20 – 2021-04-26 (×7): 30 mg via ORAL
  Filled 2021-04-20 (×6): qty 1

## 2021-04-20 MED ORDER — ASPIRIN EC 81 MG PO TBEC
81.0000 mg | DELAYED_RELEASE_TABLET | Freq: Every day | ORAL | Status: DC
  Administered 2021-04-20 – 2021-04-26 (×7): 81 mg via ORAL
  Filled 2021-04-20 (×6): qty 1

## 2021-04-20 MED ORDER — ACETAMINOPHEN 325 MG PO TABS
650.0000 mg | ORAL_TABLET | Freq: Four times a day (QID) | ORAL | Status: DC | PRN
  Administered 2021-04-25 – 2021-04-26 (×2): 650 mg via ORAL
  Filled 2021-04-20 (×3): qty 2

## 2021-04-20 MED ORDER — CLOPIDOGREL BISULFATE 75 MG PO TABS
75.0000 mg | ORAL_TABLET | Freq: Every day | ORAL | Status: DC
  Administered 2021-04-20 – 2021-04-26 (×7): 75 mg via ORAL
  Filled 2021-04-20 (×7): qty 1

## 2021-04-20 MED ORDER — HYDRALAZINE HCL 50 MG PO TABS
50.0000 mg | ORAL_TABLET | Freq: Three times a day (TID) | ORAL | Status: DC
  Administered 2021-04-20 – 2021-04-26 (×19): 50 mg via ORAL
  Filled 2021-04-20 (×4): qty 1
  Filled 2021-04-20: qty 2
  Filled 2021-04-20 (×12): qty 1

## 2021-04-20 MED ORDER — SODIUM CHLORIDE 0.9% FLUSH
10.0000 mL | Freq: Two times a day (BID) | INTRAVENOUS | Status: DC
  Administered 2021-04-21 – 2021-04-26 (×10): 10 mL

## 2021-04-20 MED ORDER — HYDRALAZINE HCL 20 MG/ML IJ SOLN
5.0000 mg | INTRAMUSCULAR | Status: DC | PRN
  Filled 2021-04-20: qty 1

## 2021-04-20 MED ORDER — FUROSEMIDE 10 MG/ML IJ SOLN
60.0000 mg | Freq: Once | INTRAMUSCULAR | Status: AC
  Administered 2021-04-20: 04:00:00 60 mg via INTRAVENOUS
  Filled 2021-04-20: qty 6

## 2021-04-20 MED ORDER — ACETAMINOPHEN 650 MG RE SUPP: 650.0000 mg | Freq: Four times a day (QID) | RECTAL | Status: DC | PRN

## 2021-04-20 MED ORDER — INSULIN ASPART 100 UNIT/ML IJ SOLN
0.0000 [IU] | Freq: Every day | INTRAMUSCULAR | Status: DC
Start: 2021-04-20 — End: 2021-04-26
  Administered 2021-04-22 – 2021-04-24 (×2): 2 [IU] via SUBCUTANEOUS

## 2021-04-20 NOTE — ED Notes (Signed)
Admitting provider at bedside.

## 2021-04-20 NOTE — Progress Notes (Signed)
Peripherally Inserted Central Catheter Placement  The IV Nurse has discussed with the patient and/or persons authorized to consent for the patient, the purpose of this procedure and the potential benefits and risks involved with this procedure.  The benefits include less needle sticks, lab draws from the catheter, and the patient may be discharged home with the catheter. Risks include, but not limited to, infection, bleeding, blood clot (thrombus formation), and puncture of an artery; nerve damage and irregular heartbeat and possibility to perform a PICC exchange if needed/ordered by physician.  Alternatives to this procedure were also discussed.  Bard Power PICC patient education guide, fact sheet on infection prevention and patient information card has been provided to patient /or left at bedside.    PICC Placement Documentation  PICC Double Lumen 04/20/21 PICC Right Brachial 39 cm 0 cm (Active)  Indication for Insertion or Continuance of Line Prolonged intravenous therapies 04/20/21 1659  Exposed Catheter (cm) 0 cm 04/20/21 1659  Site Assessment Dry;Clean;Intact 04/20/21 1659  Lumen #1 Status Flushed;Blood return noted;Saline locked 04/20/21 1659  Lumen #2 Status Flushed;Blood return noted;Saline locked 04/20/21 1659  Dressing Type Transparent 04/20/21 1659  Dressing Status Clean;Dry;Intact 04/20/21 1659  Antimicrobial disc in place? Yes 04/20/21 1659  Dressing Change Due 04/27/21 04/20/21 1659       Audrie Gallus 04/20/2021, 5:02 PM

## 2021-04-20 NOTE — ED Notes (Signed)
Breakfast orders placed 

## 2021-04-20 NOTE — Progress Notes (Signed)
PROGRESS NOTE        PATIENT DETAILS Name: Thomas West Age: 60 y.o. Sex: male Date of Birth: 1960-06-07 Admit Date: 04/19/2021 Admitting Physician Shela Leff, MD [id number], Malka So, MD  Brief Narrative: Patient is a 60 y.o. male with history of HFrEF, DM-2, HTN, HLD, CKD stage IIIa-noncompliant with follow-up and medications-presenting with at least 1 week history of exertional dyspnea, lower extremity edema-found to have decompensated systolic heart failure.  Subjective: Feels slightly better-but still with significant lower extremity edema.  Objective: Vitals: Blood pressure (!) 167/115, pulse 98, temperature 97.8 F (36.6 C), temperature source Oral, resp. rate 20, height 5\' 9"  (1.753 m), weight 69 kg, SpO2 (!) 83 %.   Exam: Gen Exam:Alert awake-not in any distress HEENT:atraumatic, normocephalic Chest: Few bibasilar rales. CVS:S1S2 regular Abdomen:soft non tender, non distended Extremities:++ edema Neurology: Non focal Skin: no rash  Pertinent Labs/Radiology: Recent Labs  Lab 04/19/21 2359 04/20/21 0722  WBC 6.2  --   HGB 13.5  --   PLT 326  --   NA 138 138  K 4.1 4.5  CREATININE 1.50* 1.43*  AST 23  --   ALT 16  --   ALKPHOS 204*  --   BILITOT 1.6*  --      Assessment/Plan: HFrEF with exacerbation: Suspect due to noncompliance-he missed his last appointment with cardiology-Per patient he is not on any diuretics-he is unclear with what medications he is supposed to be on.  He appears to be grossly overloaded.  Continue IV Lasix-restart Coreg, Imdur and hydralazine.  Given noncompliance/CKD-suspect better not to initiate Entresto until he demonstrates compliance to medication and follow-up.  Follow electrolytes, intake/output and weights.  DM-2 (A1c> 14.0 on 9/13): Unclear what oral medications he is actually taking-repeat A1c-continue with SSI-and to follow CBG trend.  Hypertensive urgency: BP persistently elevated this  morning-resume Coreg/Imdur and hydralazine.  Follow and adjust.  History of CVA: Continue antiplatelet/statin  Nonobstructive CAD: Continue antiplatelets.  Underwent LHC during his most recent hospitalization  CKD stage IIIa: Creatinine close to baseline-watch closely  HLD: Continue Lipitor  Noncompliance to medication/follow-up: Counseled extensively  BMI Estimated body mass index is 22.46 kg/m as calculated from the following:   Height as of this encounter: 5\' 9"  (1.753 m).   Weight as of this encounter: 69 kg.    Procedures: None Consults: Cardioogy DVT Prophylaxis: Lovenox Code Status:Full code  Family Communication: None at bedside  Time spent: 35 minutes-Greater than 50% of this time was spent in counseling, explanation of diagnosis, planning of further management, and coordination of care.   Disposition Plan: Status is: Observation  The patient will require care spanning > 2 midnights and should be moved to inpatient because: Grossly volume overloaded-needs IV diuretics to achieve euvolemia.   Diet: Diet Order             Diet heart healthy/carb modified Room service appropriate? Yes; Fluid consistency: Thin; Fluid restriction: 1200 mL Fluid  Diet effective now                     Antimicrobial agents: Anti-infectives (From admission, onward)    None        MEDICATIONS: Scheduled Meds:  aspirin EC  81 mg Oral Daily   carvedilol  6.25 mg Oral BID   clopidogrel  75 mg Oral Daily   enoxaparin (LOVENOX)  injection  40 mg Subcutaneous Q24H   furosemide  60 mg Intravenous BID   hydrALAZINE  50 mg Oral Q8H   insulin aspart  0-5 Units Subcutaneous QHS   insulin aspart  0-9 Units Subcutaneous TID WC   isosorbide mononitrate  30 mg Oral Daily   Continuous Infusions: PRN Meds:.acetaminophen **OR** acetaminophen, hydrALAZINE   I have personally reviewed following labs and imaging studies  LABORATORY DATA: CBC: Recent Labs  Lab 04/19/21 2359   WBC 6.2  NEUTROABS 3.5  HGB 13.5  HCT 41.4  MCV 84.1  PLT [id number]    Basic Metabolic Panel: Recent Labs  Lab 04/19/21 2359 04/20/21 0722  NA 138 138  K 4.1 4.5  CL 102 102  CO2 28 28  GLUCOSE 182* 200*  BUN 19 18  CREATININE 1.50* 1.43*  CALCIUM 8.2* 8.2*  MG  --  1.8    GFR: Estimated Creatinine Clearance: 55 mL/min (A) (by C-G formula based on SCr of 1.43 mg/dL (H)).  Liver Function Tests: Recent Labs  Lab 04/19/21 2359  AST 23  ALT 16  ALKPHOS 204*  BILITOT 1.6*  PROT 6.3*  ALBUMIN 2.7*   No results for input(s): LIPASE, AMYLASE in the last 168 hours. No results for input(s): AMMONIA in the last 168 hours.  Coagulation Profile: No results for input(s): INR, PROTIME in the last 168 hours.  Cardiac Enzymes: No results for input(s): CKTOTAL, CKMB, CKMBINDEX, TROPONINI in the last 168 hours.  BNP (last 3 results) Recent Labs    02/03/21 0911  PROBNP 3,012.0*    Lipid Profile: No results for input(s): CHOL, HDL, LDLCALC, TRIG, CHOLHDL, LDLDIRECT in the last 72 hours.  Thyroid Function Tests: No results for input(s): TSH, T4TOTAL, FREET4, T3FREE, THYROIDAB in the last 72 hours.  Anemia Panel: No results for input(s): VITAMINB12, FOLATE, FERRITIN, TIBC, IRON, RETICCTPCT in the last 72 hours.  Urine analysis:    Component Value Date/Time   COLORURINE YELLOW 03/08/2021 0246   APPEARANCEUR HAZY (A) 03/08/2021 0246   LABSPEC 1.019 03/08/2021 0246   PHURINE 5.0 03/08/2021 0246   GLUCOSEU 150 (A) 03/08/2021 0246   HGBUR NEGATIVE 03/08/2021 0246   BILIRUBINUR NEGATIVE 03/08/2021 0246   BILIRUBINUR negative 01/02/2021 1312   KETONESUR NEGATIVE 03/08/2021 0246   PROTEINUR >=300 (A) 03/08/2021 0246   UROBILINOGEN 0.2 01/02/2021 1312   NITRITE NEGATIVE 03/08/2021 0246   LEUKOCYTESUR NEGATIVE 03/08/2021 0246    Sepsis Labs: Lactic Acid, Venous No results found for: LATICACIDVEN  MICROBIOLOGY: Recent Results (from the past 240 hour(s))  Resp Panel  by RT-PCR (Flu A&B, Covid) Nasopharyngeal Swab     Status: None   Collection Time: 04/20/21  5:02 AM   Specimen: Nasopharyngeal Swab; Nasopharyngeal(NP) swabs in vial transport medium  Result Value Ref Range Status   SARS Coronavirus 2 by RT PCR NEGATIVE NEGATIVE Final    Comment: (NOTE) SARS-CoV-2 target nucleic acids are NOT DETECTED.  The SARS-CoV-2 RNA is generally detectable in upper respiratory specimens during the acute phase of infection. The lowest concentration of SARS-CoV-2 viral copies this assay can detect is 138 copies/mL. A negative result does not preclude SARS-Cov-2 infection and should not be used as the sole basis for treatment or other patient management decisions. A negative result may occur with  improper specimen collection/handling, submission of specimen other than nasopharyngeal swab, presence of viral mutation(s) within the areas targeted by this assay, and inadequate number of viral copies(<138 copies/mL). A negative result must be combined with clinical observations, patient  history, and epidemiological information. The expected result is Negative.  Fact Sheet for Patients:  BloggerCourse.com  Fact Sheet for Healthcare Providers:  SeriousBroker.it  This test is no t yet approved or cleared by the [place] FDA and  has been authorized for detection and/or diagnosis of SARS-CoV-2 by FDA under an Emergency Use Authorization (EUA). This EUA will remain  in effect (meaning this test can be used) for the duration of the COVID-19 declaration under Section 564(b)(1) of the Act, 21 U.S.C.section 360bbb-3(b)(1), unless the authorization is terminated  or revoked sooner.       Influenza A by PCR NEGATIVE NEGATIVE Final   Influenza B by PCR NEGATIVE NEGATIVE Final    Comment: (NOTE) The Xpert Xpress SARS-CoV-2/FLU/RSV plus assay is intended as an aid in the diagnosis of influenza from Nasopharyngeal swab  specimens and should not be used as a sole basis for treatment. Nasal washings and aspirates are unacceptable for Xpert Xpress SARS-CoV-2/FLU/RSV testing.  Fact Sheet for Patients: BloggerCourse.com  Fact Sheet for Healthcare Providers: SeriousBroker.it  This test is not yet approved or cleared by the [place] FDA and has been authorized for detection and/or diagnosis of SARS-CoV-2 by FDA under an Emergency Use Authorization (EUA). This EUA will remain in effect (meaning this test can be used) for the duration of the COVID-19 declaration under Section 564(b)(1) of the Act, 21 U.S.C. section 360bbb-3(b)(1), unless the authorization is terminated or revoked.  Performed at [facility], [address], [place], [place] [address]     RADIOLOGY STUDIES/RESULTS: DG Chest 1 View  Result Date: 04/20/2021 CLINICAL DATA:  Chest pain and tightness. EXAM: CHEST  1 VIEW COMPARISON:  March 08, 2021 FINDINGS: Mild, stable right infrahilar linear scarring and/or atelectasis is seen. There is no evidence of a pleural effusion or pneumothorax. Mild, stable prominence of the pulmonary vasculature is seen. The cardiac silhouette is mildly enlarged and unchanged in size. Mild calcification of the aortic arch is noted. The visualized skeletal structures are unremarkable. IMPRESSION: 1. Mild, stable right infrahilar linear scarring and/or atelectasis. 2. Stable cardiomegaly with mild pulmonary vascular congestion. Electronically Signed   By: Aram Candela M.D.   On: 04/20/2021 00:59   US Abdomen Limited RUQ (LIVER/GB)  Result Date: 04/20/2021 CLINICAL DATA:  Elevated liver function tests EXAM: ULTRASOUND ABDOMEN LIMITED RIGHT UPPER QUADRANT COMPARISON:  None. FINDINGS: Gallbladder: Incomplete distension which accounts for prominent wall thickness. No focal tenderness. Common bile duct: Diameter: 4 mm Liver: No focal lesion identified. Within  normal limits in parenchymal echogenicity. Portal vein is patent on color Doppler imaging with normal direction of blood flow towards the liver. Other: Small volume simple ascites in the right upper quadrant IMPRESSION: 1. Negative biliary tree. 2. Small volume right upper quadrant ascites. Electronically Signed   By: Tiburcio Pea M.D.   On: 04/20/2021 06:18     LOS: 0 days   Jeoffrey Massed, MD  [facility]    To contact the attending provider between 7A-7P or the covering provider during after hours 7P-7A, please log into the web site [email or web address] and access using universal [place] password for that web site. If you do not have the password, please call the hospital operator.  04/20/2021, 10:36 AM

## 2021-04-20 NOTE — ED Triage Notes (Signed)
The pt is c/o sob for one week with feet anf ankle swelling

## 2021-04-20 NOTE — H&P (Signed)
History and Physical    Thomas West KXF:[id number] DOB: 17-Aug-1960 DOA: 04/19/2021  PCP: [name], [name], MD Patient coming from: Home  Chief Complaint: Shortness of breath  HPI: [name] is a 60 y.o. male with medical history significant of chronic combined CHF, type 2 diabetes, hypertension, CAD, CKD stage IIIa, hyperlipidemia, CVA presenting to the ED complaining of shortness of breath, bilateral lower extremity edema, and chest tightness.  Not hypoxic.  Labs notable for significantly elevated BNP of 3306.  Initial high-sensitivity troponin negative, repeat pending.  EKG without acute ischemic changes.  Creatinine 1.5, at baseline.  Alk phos and T bili slightly elevated, transaminases normal.  Chest x-ray showing mild, stable right infrahilar linear scarring and/or atelectasis.  Stable cardiomegaly with mild pulmonary vascular congestion. Patient was given IV Lasix 60 mg.  Patient reports 2-week history of chest tightness with exertion.  States he usually feels better after he rests for a while.  This past week he is having dyspnea on exertion and can barely walk up a flight of stairs.  Also both of his legs are very swollen.  Patient does not seem to know which medications he takes at home.  Reports taking a total of 3 medications and does not remember their names.  States he was previously on metformin for diabetes but it was stopped by his physician and he was told that he no longer needed any medications for his diabetes.  He drinks several bottles of water and juice daily.  No other complaints.  Review of Systems:  All systems reviewed and apart from history of presenting illness, are negative.  Past Medical History:  Diagnosis Date   CHF (congestive heart failure) (HCC)    Coronary artery disease    Diabetes mellitus without complication ([place])    History of MI (myocardial infarction) 03/05/2019   Hypertension    Stroke [facility])     Past Surgical History:  Procedure Laterality  Date   leg surgery     "pins in left shin"   RIGHT/LEFT HEART CATH AND CORONARY ANGIOGRAPHY N/A 03/11/2021   Procedure: RIGHT/LEFT HEART CATH AND CORONARY ANGIOGRAPHY;  Surgeon: Early Osmond, MD;  Location: [facility];  Service: Cardiovascular;  Laterality: N/A;     reports that he quit smoking about 3 years ago. His smoking use included cigarettes. He has never used smokeless tobacco. He reports that he does not currently use alcohol. He reports current drug use. Drug: Marijuana.  Allergies  Allergen Reactions   Bee Venom     swelling    History reviewed. No pertinent family history.  Prior to Admission medications   Medication Sig Start Date End Date Taking? Authorizing Provider  aspirin EC 81 MG tablet Take 1 tablet (81 mg total) by mouth daily. 01/02/21   Wieters, Hallie C, PA-C  atorvastatin (LIPITOR) 80 MG tablet Take 1 tablet (80 mg total) by mouth daily. Patient not taking: Reported on 03/08/2021 02/03/21   [name], [name], MD  carvedilol (COREG) 25 MG tablet TAKE ONE TABLET BY MOUTH EVERY 12 HOURS (10AM &10PM) Patient not taking: No sig reported 02/03/21   [name], [name], MD  clopidogrel (PLAVIX) 75 MG tablet Take 1 tablet (75 mg total) by mouth daily. 03/17/21   Rai, Vernelle Emerald, MD  empagliflozin (JARDIANCE) 10 MG TABS tablet Take 1 tablet (10 mg total) by mouth daily before breakfast. Patient not taking: Reported on 03/08/2021 02/09/21   [name], [name], MD  furosemide (LASIX) 40 MG tablet  Take 1 tablet (40 mg total) by mouth daily. 03/17/21   Rai, Ripudeep K, MD  glipiZIDE (GLUCOTROL XL) 10 MG 24 hr tablet Take 1 tablet (10 mg total) by mouth daily with breakfast. Patient not taking: Reported on 03/08/2021 01/02/21   Wieters, Hallie C, PA-C  hydrALAZINE (APRESOLINE) 50 MG tablet TAKE 1 TABLET(50 MG) BY MOUTH THREE TIMES DAILY Patient not taking: Reported on 03/08/2021 02/04/21   [name], [name], MD  isosorbide mononitrate (IMDUR) 60 MG 24 hr tablet Take 1 tablet (60  mg total) by mouth daily. Patient not taking: Reported on 03/08/2021 01/02/21   Wieters, Hallie C, PA-C  polyethylene glycol (MIRALAX / GLYCOLAX) 17 g packet Take 17 g by mouth daily. Patient not taking: Reported on 03/08/2021 01/02/21   Wieters, Hallie C, PA-C  Semaglutide 3 MG TABS Take 3 mg by mouth daily. 03/17/21   Mendel Corning, MD    Physical Exam: Vitals:   04/19/21 2346 04/20/21 0005 04/20/21 0227 04/20/21 0330  BP: (!) 157/120  (!) 172/119 (!) 162/144  Pulse: (!) 107  (!) 53 94  Resp: 20  14 (!) 29  Temp: 97.8 F (36.6 C)     TempSrc: Oral     SpO2: 99%  95% 100%  Weight:  69 kg    Height:  '5\' 9"'  (1.753 m)      Physical Exam Constitutional:      General: He is not in acute distress. HENT:     Head: Normocephalic and atraumatic.  Eyes:     Extraocular Movements: Extraocular movements intact.     Conjunctiva/sclera: Conjunctivae normal.  Neck:     Comments: +JVD Cardiovascular:     Rate and Rhythm: Normal rate and regular rhythm.     Pulses: Normal pulses.  Pulmonary:     Effort: Pulmonary effort is normal. No respiratory distress.     Breath sounds: No wheezing.  Abdominal:     General: Bowel sounds are normal.     Palpations: Abdomen is soft.     Tenderness: There is no abdominal tenderness. There is no guarding or rebound.  Musculoskeletal:     Cervical back: Normal range of motion and neck supple.     Right lower leg: Edema present.     Left lower leg: Edema present.     Comments: +4 pitting edema of bilateral lower extremities  Skin:    General: Skin is warm and dry.  Neurological:     General: No focal deficit present.     Mental Status: He is alert and oriented to person, place, and time.     Labs on Admission: I have personally reviewed following labs and imaging studies  CBC: Recent Labs  Lab 04/19/21 2359  WBC 6.2  NEUTROABS 3.5  HGB 13.5  HCT 41.4  MCV 84.1  PLT 027   Basic Metabolic Panel: Recent Labs  Lab 04/19/21 2359  NA 138   K 4.1  CL 102  CO2 28  GLUCOSE 182*  BUN 19  CREATININE 1.50*  CALCIUM 8.2*   GFR: Estimated Creatinine Clearance: 52.4 mL/min (A) (by C-G formula based on SCr of 1.5 mg/dL (H)). Liver Function Tests: Recent Labs  Lab 04/19/21 2359  AST 23  ALT 16  ALKPHOS 204*  BILITOT 1.6*  PROT 6.3*  ALBUMIN 2.7*   No results for input(s): LIPASE, AMYLASE in the last 168 hours. No results for input(s): AMMONIA in the last 168 hours. Coagulation Profile: No results for input(s): INR, PROTIME  in the last 168 hours. Cardiac Enzymes: No results for input(s): CKTOTAL, CKMB, CKMBINDEX, TROPONINI in the last 168 hours. BNP (last 3 results) Recent Labs    02/03/21 0911  PROBNP 3,012.0*   HbA1C: No results for input(s): HGBA1C in the last 72 hours. CBG: Recent Labs  Lab 04/20/21 0501  GLUCAP 146*   Lipid Profile: No results for input(s): CHOL, HDL, LDLCALC, TRIG, CHOLHDL, LDLDIRECT in the last 72 hours. Thyroid Function Tests: No results for input(s): TSH, T4TOTAL, FREET4, T3FREE, THYROIDAB in the last 72 hours. Anemia Panel: No results for input(s): VITAMINB12, FOLATE, FERRITIN, TIBC, IRON, RETICCTPCT in the last 72 hours. Urine analysis:    Component Value Date/Time   COLORURINE YELLOW 03/08/2021 0246   APPEARANCEUR HAZY (A) 03/08/2021 0246   LABSPEC 1.019 03/08/2021 0246   PHURINE 5.0 03/08/2021 0246   GLUCOSEU 150 (A) 03/08/2021 0246   HGBUR NEGATIVE 03/08/2021 0246   BILIRUBINUR NEGATIVE 03/08/2021 0246   BILIRUBINUR negative 01/02/2021 1312   KETONESUR NEGATIVE 03/08/2021 0246   PROTEINUR >=300 (A) 03/08/2021 0246   UROBILINOGEN 0.2 01/02/2021 1312   NITRITE NEGATIVE 03/08/2021 0246   LEUKOCYTESUR NEGATIVE 03/08/2021 0246    Radiological Exams on Admission: DG Chest 1 View  Result Date: 04/20/2021 CLINICAL DATA:  Chest pain and tightness. EXAM: CHEST  1 VIEW COMPARISON:  March 08, 2021 FINDINGS: Mild, stable right infrahilar linear scarring and/or atelectasis  is seen. There is no evidence of a pleural effusion or pneumothorax. Mild, stable prominence of the pulmonary vasculature is seen. The cardiac silhouette is mildly enlarged and unchanged in size. Mild calcification of the aortic arch is noted. The visualized skeletal structures are unremarkable. IMPRESSION: 1. Mild, stable right infrahilar linear scarring and/or atelectasis. 2. Stable cardiomegaly with mild pulmonary vascular congestion. Electronically Signed   By: Virgina Norfolk M.D.   On: 04/20/2021 00:59    EKG: Independently reviewed.  Sinus tachycardia, T wave abnormality in inferior lateral leads similar to prior tracing.  No acute changes.  Assessment/Plan Principal Problem:   CHF exacerbation (HCC) Active Problems:   CAD (coronary artery disease)   HLD (hyperlipidemia)   Hypertension, essential, benign   Diabetes mellitus ([place])  Acute on chronic combined CHF Patient was admitted last month for decompensated CHF and echo showing EF 20 to 25% with severely decreased LV function, global hypokinesis, grade 2 diastolic dysfunction.  Underwent cardiac catheterization which revealed mild obstructive CAD with patent RPLV stent.  Delene Loll was held due to renal insufficiency and dose of Lasix increased from 20 mg daily to 40 mg daily.  Suspect noncompliance as he does not seem to know which medications he takes at home.  Also reports drinking a lot of fluids.  Appears volume overloaded on exam.  BNP significantly elevated at 3306.  Chest x-ray showing stable cardiomegaly and mild pulmonary vascular congestion.  Not hypoxic. -Continue diuresis with IV Lasix 60 mg twice daily.  Monitor intake and output, daily weights.  Low-sodium diet with fluid restriction.  Resume home Coreg, Imdur, and hydralazine after pharmacy med rec is done.  Chest pain Patient reports 2-week history of exertional chest tightness.  Cardiac catheterization done last month showing mild obstructive CAD with patent RPLV stent.   ACS less likely as high-sensitivity troponin negative x2 and EKG without acute ischemic changes.  Chest pain likely related to decompensated CHF.  Currently chest pain-free. -Continue to monitor  Uncontrolled type 2 diabetes Due to noncompliance.  Recent A1c above 14 on 02/03/2021.  He was prescribed oral  hypoglycemic agents during hospitalization last month but does not seem to be taking them. -Sliding scale insulin sensitive ACHS.  Consult diabetes coordinator.  Abnormal LFTs Alkaline phosphatase and T bili slightly elevated, transaminases normal. -Right upper quadrant ultrasound  Uncontrolled hypertension Likely due to noncompliance.  Blood pressure elevated. -IV hydralazine PRN. Resume Coreg, Imdur, and hydralazine after pharmacy med rec is done.  CAD -Resume aspirin, Plavix, Coreg, and statin after pharmacy med rec is done.  CKD stage IIIa Creatinine 1.5, at baseline. -Continue to monitor renal function  Hyperlipidemia -Resume Lipitor after pharmacy med rec is done.  History of CVA -Resume aspirin, Plavix, and statin after pharmacy med rec is done.  DVT prophylaxis: Lovenox Code Status: Full code-discussed with the patient Family Communication: No family available at this time. Disposition Plan: Status is: Observation  The patient remains OBS appropriate and will d/c before 2 midnights.  Level of care: Level of care: Telemetry Cardiac  The medical decision making on this patient was of high complexity and the patient is at high risk for clinical deterioration, therefore this is a level 3 visit.  Shela Leff MD [facility]  If 7PM-7AM, please contact night-coverage [email or web address]  04/20/2021, 6:15 AM

## 2021-04-20 NOTE — Progress Notes (Signed)
Inpatient Diabetes Program Recommendations  AACE/ADA: New Consensus Statement on Inpatient Glycemic Control (2015)  Target Ranges:  Prepandial:   less than 140 mg/dL      Peak postprandial:   less than 180 mg/dL (1-2 hours)      Critically ill patients:  140 - 180 mg/dL   Lab Results  Component Value Date   GLUCAP 146 (H) 04/20/2021   HGBA1C >14.0 (H) 02/03/2021    Review of Glycemic Control  Diabetes history: DM2 Outpatient Diabetes medications: Not taking meds discharged home on 03/17/21 including Jardiance 10, Glucotrol XL, Semaglutide 3 mg. Current orders for Inpatient glycemic control: Novolog correction 0-9 units tid + hs 0-5 units  Inpatient Diabetes Program Recommendations:   Spoke with patient to verify how long he has been off of diabetes medications and patient states he didn't receive prescriptions @ discharge from the hospital so has not been taking any diabetes medications.  Patient does not have a glucose meter and will need one on discharge. Order # [id number]  Thank you, Thomas West. Aunesti Pellegrino, RN, MSN, CDE  Diabetes Coordinator Inpatient Glycemic Control Team Team Pager [phone] (8am-5pm) 04/20/2021 8:08 AM

## 2021-04-20 NOTE — ED Notes (Signed)
Pt's O2 sats drop into low 90s when asleep-- placed on 2 L/M/[place]--

## 2021-04-20 NOTE — Progress Notes (Addendum)
Co-ox this afternoon came back at 72%.   Obtained via straight stick  Requested repeat co-ox from PICC line once placed

## 2021-04-20 NOTE — ED Provider Notes (Signed)
MSE was initiated and I personally evaluated the patient and placed orders (if any) at  12:01 AM on April 20, 2021.  Patient with h/o CHF on lasix, recent hospitalization October for same. Here with progressively worsening SOB, chest tightness, LE edema. No fever, cough. Reports medication compliance.  Today's Vitals   04/19/21 2346  BP: (!) 157/120  Pulse: (!) 107  Resp: 20  Temp: 97.8 F (36.6 C)  TempSrc: Oral  SpO2: 99%   There is no height or weight on file to calculate BMI.  Rales and diminished air bilateral lower lobes Bilateral LE edema RRR  The patient appears stable so that the remainder of the MSE may be completed by another provider.   Elpidio Anis, PA-C 04/20/21 0003    Gilda Crease, MD 04/20/21 [phone]

## 2021-04-20 NOTE — ED Notes (Signed)
RN aware of pt BP 

## 2021-04-20 NOTE — ED Notes (Signed)
Marchelle Folks TRN assisted this RN in setting up CVP monitoring via PICC line. CVP 14 at this time.

## 2021-04-20 NOTE — ED Notes (Addendum)
Pt cardiac monitor displayed run of ventricular tachycardia, 6 non-interrupted beats. Captured & printed from monitor at nurse's station. Pt appearing asymptomatic, not c/o pain or discomfort. Dr. Loney Loh paged.

## 2021-04-20 NOTE — ED Provider Notes (Signed)
[facility] EMERGENCY DEPARTMENT Provider Note   CSN: [id number] Arrival date & time: 04/19/21  2333     History Chief Complaint  Patient presents with   Shortness of Breath    Thomas West is a 60 y.o. male.  Patient presents to the emergency department for evaluation of shortness of breath.  Patient reports that he was hospitalized approximately 1 month ago with similar symptoms.  At that time he had a congestive heart failure exacerbation.  Patient reports that over the last week he has noted progressive swelling of his legs and shortness of breath.  Patient reports that he cannot walk around his home now because of severe shortness of breath and tightness in his chest when this occurs.   Shortness of Breath     Past Medical History:  Diagnosis Date   CHF (congestive heart failure) (HCC)    Coronary artery disease    Diabetes mellitus without complication (HCC)    History of MI (myocardial infarction) 03/05/2019   Hypertension    Stroke [facility])     Patient Active Problem List   Diagnosis Date Noted   Acute on chronic systolic CHF (congestive heart failure) (HCC) 03/09/2021   Acute on chronic HFrEF (heart failure with reduced ejection fraction) (HCC) 03/08/2021   HFrEF (heart failure with reduced ejection fraction) (HCC) 02/03/2021   Peripheral neuropathy 02/03/2021   Diabetes mellitus (HCC) 03/27/2019   Hyperlipidemia associated with type 2 diabetes mellitus (HCC) 03/27/2019   CAD (coronary artery disease) 03/05/2019   History of MI (myocardial infarction) 03/05/2019   Type 2 diabetes mellitus with diabetic neuropathy, unspecified (HCC) 03/05/2019   CKD (chronic kidney disease), stage II 03/05/2019   HLD (hyperlipidemia) 03/05/2019   GERD (gastroesophageal reflux disease) 03/05/2019   Hypertension, essential, benign 03/05/2019    Past Surgical History:  Procedure Laterality Date   leg surgery     "pins in left shin"   RIGHT/LEFT HEART CATH  AND CORONARY ANGIOGRAPHY N/A 03/11/2021   Procedure: RIGHT/LEFT HEART CATH AND CORONARY ANGIOGRAPHY;  Surgeon: Orbie Pyo, MD;  Location: [facility] INVASIVE [facility];  Service: Cardiovascular;  Laterality: N/A;       No family history on file.  Social History   Tobacco Use   Smoking status: Former    Types: Cigarettes    Quit date: 10/06/2017    Years since quitting: 3.5   Smokeless tobacco: Never  Substance Use Topics   Alcohol use: Not Currently   Drug use: Yes    Types: Marijuana    Home Medications Prior to Admission medications   Medication Sig Start Date End Date Taking? Authorizing Provider  aspirin EC 81 MG tablet Take 1 tablet (81 mg total) by mouth daily. 01/02/21   Wieters, Hallie C, PA-C  atorvastatin (LIPITOR) 80 MG tablet Take 1 tablet (80 mg total) by mouth daily. Patient not taking: Reported on 03/08/2021 02/03/21   [name], [name], MD  carvedilol (COREG) 25 MG tablet TAKE ONE TABLET BY MOUTH EVERY 12 HOURS (10AM &10PM) Patient not taking: No sig reported 02/03/21   [name], [name], MD  clopidogrel (PLAVIX) 75 MG tablet Take 1 tablet (75 mg total) by mouth daily. 03/17/21   Rai, Delene Ruffini, MD  empagliflozin (JARDIANCE) 10 MG TABS tablet Take 1 tablet (10 mg total) by mouth daily before breakfast. Patient not taking: Reported on 03/08/2021 02/09/21   [name], [name], MD  furosemide (LASIX) 40 MG tablet Take 1 tablet (40 mg total) by mouth daily.  03/17/21   Rai, Ripudeep K, MD  glipiZIDE (GLUCOTROL XL) 10 MG 24 hr tablet Take 1 tablet (10 mg total) by mouth daily with breakfast. Patient not taking: Reported on 03/08/2021 01/02/21   Wieters, Hallie C, PA-C  hydrALAZINE (APRESOLINE) 50 MG tablet TAKE 1 TABLET(50 MG) BY MOUTH THREE TIMES DAILY Patient not taking: Reported on 03/08/2021 02/04/21   [name], [name], MD  isosorbide mononitrate (IMDUR) 60 MG 24 hr tablet Take 1 tablet (60 mg total) by mouth daily. Patient not taking: Reported on 03/08/2021 01/02/21   Wieters,  Hallie C, PA-C  polyethylene glycol (MIRALAX / GLYCOLAX) 17 g packet Take 17 g by mouth daily. Patient not taking: Reported on 03/08/2021 01/02/21   Wieters, Hallie C, PA-C  Semaglutide 3 MG TABS Take 3 mg by mouth daily. 03/17/21   Rai, Vernelle Emerald, MD    Allergies    Bee venom  Review of Systems   Review of Systems  Respiratory:  Positive for chest tightness and shortness of breath.   Cardiovascular:  Positive for leg swelling.  All other systems reviewed and are negative.  Physical Exam Updated Vital Signs BP (!) 172/119 (BP Location: Right Arm)   Pulse (!) 53   Temp 97.8 F (36.6 C) (Oral)   Resp 14   Ht 5\' 9"  (1.753 m)   Wt 69 kg   SpO2 95%   BMI 22.46 kg/m   Physical Exam Vitals and nursing note reviewed.  Constitutional:      General: He is not in acute distress.    Appearance: Normal appearance. He is well-developed.  HENT:     Head: Normocephalic and atraumatic.     Right Ear: Hearing normal.     Left Ear: Hearing normal.     Nose: Nose normal.  Eyes:     Conjunctiva/sclera: Conjunctivae normal.     Pupils: Pupils are equal, round, and reactive to light.  Cardiovascular:     Rate and Rhythm: Regular rhythm. Tachycardia present. FrequentExtrasystoles are present.    Heart sounds: S1 normal and S2 normal. No murmur heard.   No friction rub. No gallop.  Pulmonary:     Effort: Pulmonary effort is normal. No respiratory distress.     Breath sounds: Normal breath sounds.  Chest:     Chest wall: No tenderness.  Abdominal:     General: Bowel sounds are normal.     Palpations: Abdomen is soft.     Tenderness: There is no abdominal tenderness. There is no guarding or rebound. Negative signs include Murphy's sign and McBurney's sign.     Hernia: No hernia is present.  Musculoskeletal:        General: Normal range of motion.     Cervical back: Normal range of motion and neck supple.     Right lower leg: 3+ Edema present.     Left lower leg: 3+ Edema present.   Skin:    General: Skin is warm and dry.     Findings: No rash.  Neurological:     Mental Status: He is alert and oriented to person, place, and time.     GCS: GCS eye subscore is 4. GCS verbal subscore is 5. GCS motor subscore is 6.     Cranial Nerves: No cranial nerve deficit.     Sensory: No sensory deficit.     Coordination: Coordination normal.  Psychiatric:        Speech: Speech normal.        Behavior: Behavior  normal.        Thought Content: Thought content normal.    ED Results / Procedures / Treatments   Labs (all labs ordered are listed, but only abnormal results are displayed) Labs Reviewed  CBC WITH DIFFERENTIAL/PLATELET - Abnormal; Notable for the following components:      Result Value   RDW 15.8 (*)    All other components within normal limits  BRAIN NATRIURETIC PEPTIDE - Abnormal; Notable for the following components:   B Natriuretic Peptide 3,306.4 (*)    All other components within normal limits  COMPREHENSIVE METABOLIC PANEL - Abnormal; Notable for the following components:   Glucose, Bld 182 (*)    Creatinine, Ser 1.50 (*)    Calcium 8.2 (*)    Total Protein 6.3 (*)    Albumin 2.7 (*)    Alkaline Phosphatase 204 (*)    Total Bilirubin 1.6 (*)    GFR, Estimated 54 (*)    All other components within normal limits  TROPONIN I (HIGH SENSITIVITY)  TROPONIN I (HIGH SENSITIVITY)    EKG EKG Interpretation  Date/Time:  Sunday April 19 2021 23:43:48 EST Ventricular Rate:  109 PR Interval:  156 QRS Duration: 76 QT Interval:  346 QTC Calculation: 465 R Axis:   111 Text Interpretation: Sinus tachycardia with occasional Premature ventricular complexes Right axis deviation Abnormal ECG Confirmed by Orpah Greek [phone]) on 04/20/2021 3:53:21 AM  Radiology DG Chest 1 View  Result Date: 04/20/2021 CLINICAL DATA:  Chest pain and tightness. EXAM: CHEST  1 VIEW COMPARISON:  March 08, 2021 FINDINGS: Mild, stable right infrahilar linear scarring  and/or atelectasis is seen. There is no evidence of a pleural effusion or pneumothorax. Mild, stable prominence of the pulmonary vasculature is seen. The cardiac silhouette is mildly enlarged and unchanged in size. Mild calcification of the aortic arch is noted. The visualized skeletal structures are unremarkable. IMPRESSION: 1. Mild, stable right infrahilar linear scarring and/or atelectasis. 2. Stable cardiomegaly with mild pulmonary vascular congestion. Electronically Signed   By: Virgina Norfolk M.D.   On: 04/20/2021 00:59    Procedures Procedures   Medications Ordered in ED Medications - No data to display  ED Course  I have reviewed the triage vital signs and the nursing notes.  Pertinent labs & imaging results that were available during my care of the patient were reviewed by me and considered in my medical decision making (see chart for details).    MDM Rules/Calculators/A&P                           Patient presents to the emergency department for evaluation of shortness of breath.  Patient with significant lower extremity edema.  He did have a hospitalization in October with similar presentation.  At that time he had an echo that showed ejection fraction of 20 to 25% with global hypokinesis.  He was diuresed and improved.  Upon discharge his Lasix was increased from 20 mg daily to 40 mg daily.  Delene Loll was discontinued.  Records indicate that the patient has poor insight into his disease process, likely having difficulty following heart failure diet and instructions.  BNP is back up to 3300, similar to prior admission.  Patient indicates that he has no exercise tolerance currently, cannot ambulate at all without having to stop and rest because of dyspnea and some chest tightness.  Will initiate diuresis and admit patient for further management.  Final Clinical Impression(s) / ED Diagnoses  Final diagnoses:  CHF (congestive heart failure) ([place])    Rx / DC Orders ED Discharge  Orders     None        Elek Holderness, Gwenyth Allegra, MD 04/20/21 [phone]

## 2021-04-20 NOTE — Consult Note (Addendum)
Advanced Heart Failure Team Consult Note   Primary Physician: [name], [name], MD PCP-Cardiologist:  Jenkins Rouge, MD  Reason for Consultation: A/c systolic HF  HPI:    Thomas West is seen today for evaluation of  a/c systolic HF at the request of Dr. Sloan Leiter with [facility]. Thomas West is a 60 year old male with history of CAD with prior MI and stent in 01/2019 at [facility] in [place] (report not available), cardiomyopathy/chronic systolic HF, uncontrolled DM II, HTN, hx CVA on chart review, CKD.   Moved to [place] from [place] 2 years ago. Had not medical f/u until he was admitted in 49/44 with a/c systolic HF. Had been out of cardiac medications. He diuresed with IV lasix then transitioned to po lasix 40 mg daily at discharge. Started on GDMT with carvedilol, hydralazine, imdur and empagliflozin.   Echo 10/22 with EF 20-25%, RV okay, trivial MR  [facility] 10/22 with nonobstructive CAD and patent RPLV stent. RA mean 5 mmHg, PCWP mean 5 mmHg, Fick CO 7.46/CI 3.85.  He did not show up for [facility] appointment after discharge. Presented to ED this am with worsening dyspnea, exertional chest tightness and LE edema X 3 weeks.  Dyspneic with minimal exertion progressing to dyspnea at rest. BP uncontrolled on presentation, 172/119 mmHg. BNP 3,306 (3,000 10/22), Scr 1.50 (baseline 1.5), HS troponin negative X 2. Alk phos 204, AST and ALT WNL. ECG with sinus tachycardia, rate 109 bpm, PVCs. Chest x-ray with evidence of CHG. Given 60 mg lasix IV. He was admitted to hospitalist service. GDMT restarted including carvedilol, hydralazine and imdur. Now diuresing with IV lasix 60 mg BID.  Patient reports he has filled up 3 urinals since arrival to the ED. Dyspnea and leg edema starting to improve.  In chart review, there has been concern for noncompliance with medications. He is adamant he has been taking his medications and reports his sister assists him with them. Drinks 3 bottles of water and  several cups of juice every day.   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 Has been on disability the last few years due to medical conditions. Lives with his son.  Denies prior history of illicit drug use. States he has not consumed alcohol or smoked cigarettes in several years.  Review of Systems: [y] = yes, _0  = no   General: Weight gain [Y]; Weight loss _1 ; Anorexia _2 ; Fatigue [Y]; Fever _3 ; Chills _4 ; Weakness _5   Cardiac: Chest pain/pressure [Y]; Resting SOB [Y]; Exertional SOB [Y]; Orthopnea [Y]; Pedal Edema [Y]; Palpitations _6 ; Syncope _7 ; Presyncope _8 ; Paroxysmal nocturnal dyspnea[Y]  Pulmonary: Cough _9 ; Wheezing_10 ; Hemoptysis_11 ; Sputum _12 ; Snoring _13   GI: Vomiting_14 ; Dysphagia_15 ; Melena_16 ; Hematochezia _17 ; Heartburn_18 ; Abdominal pain _19 ; Constipation _20 ; Diarrhea _21 ; BRBPR _22   GU: Hematuria_23 ; Dysuria _24 ; Nocturia_25   Vascular: Pain in legs with walking _26 ; Pain in feet with lying flat _27 ; Non-healing sores _28 ; Stroke _29 ; TIA _30 ; Slurred speech _31 ;  Neuro: Headaches_32 ; Vertigo_33 ; Seizures_34 ; Paresthesias_35 ;Blurred vision _36 ; Diplopia _37 ; Vision changes _38   Ortho/Skin: Arthritis _39 ; Joint pain _40 ; Muscle pain _41 ; Joint swelling _42 ; Back Pain _43 ; Rash _44   Psych: Depression_45 ; Anxiety_46   Heme: Bleeding problems _47 ; Clotting disorders _48 ;  Anemia _0   Endocrine: Diabetes [Y]; Thyroid dysfunction_1   Home Medications Prior to Admission medications   Medication Sig Start Date End Date Taking? Authorizing Provider  aspirin EC 81 MG tablet Take 1 tablet (81 mg total) by mouth daily. 01/02/21   Wieters, Hallie C, PA-C  atorvastatin (LIPITOR) 80 MG tablet Take 1 tablet (80 mg total) by mouth daily. Patient not taking: Reported on 03/08/2021 02/03/21   [name], [name], MD  carvedilol (COREG) 25 MG tablet TAKE ONE TABLET BY MOUTH EVERY 12 HOURS (10AM &10PM) Patient not taking: No sig reported 02/03/21   [name], [name], MD   clopidogrel (PLAVIX) 75 MG tablet Take 1 tablet (75 mg total) by mouth daily. 03/17/21   Rai, Vernelle Emerald, MD  empagliflozin (JARDIANCE) 10 MG TABS tablet Take 1 tablet (10 mg total) by mouth daily before breakfast. Patient not taking: Reported on 03/08/2021 02/09/21   [name], [name], MD  furosemide (LASIX) 40 MG tablet Take 1 tablet (40 mg total) by mouth daily. 03/17/21   Rai, Ripudeep K, MD  glipiZIDE (GLUCOTROL XL) 10 MG 24 hr tablet Take 1 tablet (10 mg total) by mouth daily with breakfast. Patient not taking: Reported on 03/08/2021 01/02/21   Wieters, Hallie C, PA-C  hydrALAZINE (APRESOLINE) 50 MG tablet TAKE 1 TABLET(50 MG) BY MOUTH THREE TIMES DAILY Patient not taking: Reported on 03/08/2021 02/04/21   [name], [name], MD  isosorbide mononitrate (IMDUR) 60 MG 24 hr tablet Take 1 tablet (60 mg total) by mouth daily. Patient not taking: Reported on 03/08/2021 01/02/21   Wieters, Hallie C, PA-C  polyethylene glycol (MIRALAX / GLYCOLAX) 17 [place] packet Take 17 [place] by mouth daily. Patient not taking: Reported on 03/08/2021 01/02/21   Wieters, Hallie C, PA-C  Semaglutide 3 MG TABS Take 3 mg by mouth daily. 03/17/21   Mendel Corning, MD    Past Medical History: Past Medical History:  Diagnosis Date   CHF (congestive heart failure) ([place])    Coronary artery disease    Diabetes mellitus without complication ([place])    History of MI (myocardial infarction) 03/05/2019   Hypertension    Stroke [facility])     Past Surgical History: Past Surgical History:  Procedure Laterality Date   leg surgery     "pins in left shin"   RIGHT/LEFT HEART CATH AND CORONARY ANGIOGRAPHY N/A 03/11/2021   Procedure: RIGHT/LEFT HEART CATH AND CORONARY ANGIOGRAPHY;  Surgeon: Early Osmond, MD;  Location: [place] [facility];  Service: Cardiovascular;  Laterality: N/A;    Family History: History reviewed. No pertinent family history.  Social History: Social History   Socioeconomic History   Marital status: Single     Spouse name: Not on file   Number of children: Not on file   Years of education: Not on file   Highest education level: Not on file  Occupational History   Not on file  Tobacco Use   Smoking status: Former    Types: Cigarettes    Quit date: 10/06/2017    Years since quitting: 3.5   Smokeless tobacco: Never  Substance and Sexual Activity   Alcohol use: Not Currently   Drug use: Yes    Types: Marijuana   Sexual activity: Not on file  Other Topics Concern   Not on file  Social History Narrative   Not on file   Social Determinants of Health   Financial Resource Strain: Low Risk    Difficulty of Paying Living Expenses: Not very  hard  Food Insecurity: No Food Insecurity   Worried About Charity fundraiser in the Last Year: Never true   Ran Out of Food in the Last Year: Never true  Transportation Needs: No Transportation Needs   Lack of Transportation (Medical): No   Lack of Transportation (Non-Medical): No  Physical Activity: Not on file  Stress: Not on file  Social Connections: Not on file    Allergies:  Allergies  Allergen Reactions   Bee Venom     swelling    Objective:    Vital Signs:   Temp:  [97.8 F (36.6 C)] 97.8 F (36.6 C) (11/27 2346) Pulse Rate:  [53-107] 89 (11/28 1230) Resp:  [12-29] 29 (11/28 1230) BP: (138-172)/(69-144) 138/112 (11/28 1230) SpO2:  [83 %-100 %] 100 % (11/28 1230) Weight:  [69 kg] 69 kg (11/28 0005)    Weight change: Filed Weights   04/20/21 0005  Weight: 69 kg    Intake/Output:   Intake/Output Summary (Last 24 hours) at 04/20/2021 1304 Last data filed at 04/20/2021 0926 Gross per 24 hour  Intake --  Output 750 ml  Net -750 ml      Physical Exam    General:  Lying comfortably in bed.  HEENT: normal Neck: supple. JVP to jaw. Carotids 2+ bilat; no bruits.  Cor: PMI nondisplaced. S1, S2. + S3. Regular rhythm, tachycardic with occasional ectopy. No rubs, gallops or murmurs. Lungs: bibasilar crackles Abdomen: soft,  nontender,+ distended. No hepatosplenomegaly.  Extremities: no cyanosis, clubbing, rash, tense lower extremity edema up to thighs, bl/l lower extremities are cool to touch, pressure wound left heel Neuro: alert & orientedx3, cranial nerves grossly intact. moves all 4 extremities w/o difficulty. Affect pleasant   Telemetry   Sinus rhythm/sinus tach, 90s-100s, 10-20 PVCs/min  EKG    Sinus tachycardia 109 bpm, PVCs  Labs   Basic Metabolic Panel: Recent Labs  Lab 04/19/21 2359 04/20/21 0722  NA 138 138  K 4.1 4.5  CL 102 102  CO2 28 28  GLUCOSE 182* 200*  BUN 19 18  CREATININE 1.50* 1.43*  CALCIUM 8.2* 8.2*  MG  --  1.8    Liver Function Tests: Recent Labs  Lab 04/19/21 2359  AST 23  ALT 16  ALKPHOS 204*  BILITOT 1.6*  PROT 6.3*  ALBUMIN 2.7*   No results for input(s): LIPASE, AMYLASE in the last 168 hours. No results for input(s): AMMONIA in the last 168 hours.  CBC: Recent Labs  Lab 04/19/21 2359  WBC 6.2  NEUTROABS 3.5  HGB 13.5  HCT 41.4  MCV 84.1  PLT 326    Cardiac Enzymes: No results for input(s): CKTOTAL, CKMB, CKMBINDEX, TROPONINI in the last 168 hours.  BNP: BNP (last 3 results) Recent Labs    03/08/21 0258 03/09/21 0304 04/19/21 2359  BNP 3,620.7* 2,895.4* 3,306.4*    ProBNP (last 3 results) Recent Labs    02/03/21 0911  PROBNP 3,012.0*     CBG: Recent Labs  Lab 04/20/21 0501 04/20/21 1221  GLUCAP 146* 190*    Coagulation Studies: No results for input(s): LABPROT, INR in the last 72 hours.   Imaging   DG Chest 1 View  Result Date: 04/20/2021 CLINICAL DATA:  Chest pain and tightness. EXAM: CHEST  1 VIEW COMPARISON:  March 08, 2021 FINDINGS: Mild, stable right infrahilar linear scarring and/or atelectasis is seen. There is no evidence of a pleural effusion or pneumothorax. Mild, stable prominence of the pulmonary vasculature is seen. The cardiac silhouette  is mildly enlarged and unchanged in size. Mild calcification  of the aortic arch is noted. The visualized skeletal structures are unremarkable. IMPRESSION: 1. Mild, stable right infrahilar linear scarring and/or atelectasis. 2. Stable cardiomegaly with mild pulmonary vascular congestion. Electronically Signed   By: Virgina Norfolk M.D.   On: 04/20/2021 00:59   US Abdomen Limited RUQ (LIVER/GB)  Result Date: 04/20/2021 CLINICAL DATA:  Elevated liver function tests EXAM: ULTRASOUND ABDOMEN LIMITED RIGHT UPPER QUADRANT COMPARISON:  None. FINDINGS: Gallbladder: Incomplete distension which accounts for prominent wall thickness. No focal tenderness. Common bile duct: Diameter: 4 mm Liver: No focal lesion identified. Within normal limits in parenchymal echogenicity. Portal vein is patent on color Doppler imaging with normal direction of blood flow towards the liver. Other: Small volume simple ascites in the right upper quadrant IMPRESSION: 1. Negative biliary tree. 2. Small volume right upper quadrant ascites. Electronically Signed   By: Jorje Guild M.D.   On: 04/20/2021 06:18     Medications:     Current Medications:  aspirin EC  81 mg Oral Daily   atorvastatin  40 mg Oral Daily   carvedilol  6.25 mg Oral BID   clopidogrel  75 mg Oral Daily   enoxaparin (LOVENOX) injection  40 mg Subcutaneous Q24H   furosemide  60 mg Intravenous BID   hydrALAZINE  50 mg Oral Q8H   insulin aspart  0-5 Units Subcutaneous QHS   insulin aspart  0-9 Units Subcutaneous TID WC   isosorbide mononitrate  30 mg Oral Daily    Infusions:     Patient Profile   59 y.o. male with histoy of chronic systolic HF, CAD, HTN, uncontrolled DM, medical noncompliance now admitted with a/c systolic HF  Assessment/Plan  Acute on chronic systolic HF/cardiomyopathy: -Onset not certain. He reports cardiomyopathy diagnosed just prior to MI while living in [place] a couple of years ago. -Echo 10/22 with EF 20-25%, RV okay -R/LHC 10/22: Patent RPLV stent with nonobstructive disease elsewhere,  Preserved CO/CI, RA mean 5 mmHg, PCWP mean 5 mmHg - Etiology not certain. ? If due to frequent PVCs and/or uncontrolled hypertension. Denies hx drug abuse. Reports alcohol use in past, but none last few years. May need cardiac MRI this admit - Now readmitted with a/c HF. He is adamant he has been adherent with medications and reports his sister assists him with organizing them.  - NYHA 3b. Appears volume overloaded. Diuresing with IV lasix 60 mg BID. Continue. - Discussed fluid restriction, fluid intake likely contributing to decompensation  - HCO3 okay. Extremities cool. Sinus tachycardia noted. Concern for low output. Will check lactic acid. Place PICC line for monitoring CVP and coox. - Will not increase carvedilol any further d/t a/c systolic HF. May need to stop if low output confirmed. - Continue hydralazine 50 mg TID + imdur 30 mg daily - Add spiro tomorrow if k remains stable - Start entresto 24/26 mg BID - No SGLT2i with uncontrolled diabetes - May need to consider digoxin - UNNA boots  2. PVCs: -10-20/min on telemetry -? If contributing to cardiomyopathy -May need to consider antiarrhythmic therapy, amio or mexiletine -Keep K > 4, Mag > 2  3. Chest tightness: - Likely d/t a/c HF - HS troponin negative X 2 - Recent cath with patent stent and no new obstructive disease. See below.  4. CAD: - Prior MI followed by PCI in [place] in either 2019 or 2020.  - Patent RPLV stent on [facility] 10/22. Also had 20% distal  RCA, 30% p LAD, 70% d LAD, 60% OM2 - On aspirin, plavix - Increase atorvastatin to prior home dose of 80 mg   5. HTN: - Not controlled on admit. Noncompliance a concern but reports taking all of his medicines. - Improving after restarting some of his home medications  6. Uncontrolled DM: - per TRH - no SGLT2i for now as a1c > 14 in 09/22  7. Elevated alk phos -204 on 11/27, AST/ALT normal -[place] with RUQ ascites, otherwise negative  8. Left heel ulcer - Looks like a  pressure wound - Reports it has been present since his last admission - Wound consult  Length of Stay: 0  Shetara Launer, [place], PA-C  04/20/2021, 1:04 PM  Advanced Heart Failure Team Pager [phone] (M-F; 7a - 5p)  Please contact [facility] for night-coverage after hours (4p -7a ) and weekends on [email or web address]   Patient seen and examined with the above-signed Advanced Practice Provider and/or Housestaff. I personally reviewed laboratory data, imaging studies and relevant notes. I independently examined the patient and formulated the important aspects of the plan. I have edited the note to reflect any of my changes or salient points. I have personally discussed the plan with the patient and/or family.  60 y/o male with uncontrolled DM2, CAD,  severe systolic HF due to mixed iCM/NICM (mostly NICM) EF 20-25%. Recently admitted with ADHF. Felt better after discharge but over last 1-2 weeks has had worsening HF symptoms and volume overload. Now NYHA IV.   General:  Weak appearing. No resp difficulty HEENT: normal + poor dentition Neck: supple. JVP to jaw . Carotids 2+ bilat; no bruits. No lymphadenopathy or thryomegaly appreciated. Cor: PMI nondisplaced. Regular rate & rhythm. + s3 Lungs: clear Abdomen: soft, nontender, nondistended. No hepatosplenomegaly. No bruits or masses. Good bowel sounds. Extremities: no cyanosis, clubbing, rash, 2+ edema cool Neuro: alert & orientedx3, cranial nerves grossly intact. moves all 4 extremities w/o difficulty. Affect pleasant  He has marked overload and likely low output HF however he is responding to IV diuresis. Will continue IV diuresis. Place PICC to assess CVP and co-ox. Given uncontrolled DM2 and limited insight into his HF (could not name 1 of his HF meds), I worry he may not be a candidate for advanced therapies. PVCs may be contributing to CM will quantify on tele. Will also plan cMRI.  Glori Bickers, MD  4:23 PM

## 2021-04-20 NOTE — ED Notes (Signed)
Patient provided with a sandwich and drink.; CBG checked prior to patient having food.

## 2021-04-21 DIAGNOSIS — E1169 Type 2 diabetes mellitus with other specified complication: Secondary | ICD-10-CM | POA: Diagnosis not present

## 2021-04-21 DIAGNOSIS — I5023 Acute on chronic systolic (congestive) heart failure: Secondary | ICD-10-CM | POA: Diagnosis not present

## 2021-04-21 DIAGNOSIS — I5043 Acute on chronic combined systolic (congestive) and diastolic (congestive) heart failure: Secondary | ICD-10-CM | POA: Diagnosis not present

## 2021-04-21 DIAGNOSIS — I10 Essential (primary) hypertension: Secondary | ICD-10-CM | POA: Diagnosis not present

## 2021-04-21 DIAGNOSIS — E785 Hyperlipidemia, unspecified: Secondary | ICD-10-CM | POA: Diagnosis not present

## 2021-04-21 LAB — GLUCOSE, CAPILLARY
Glucose-Capillary: 173 mg/dL — ABNORMAL HIGH (ref 70–99)
Glucose-Capillary: 238 mg/dL — ABNORMAL HIGH (ref 70–99)
Glucose-Capillary: 220 mg/dL — ABNORMAL HIGH (ref 70–99)
Glucose-Capillary: 223 mg/dL — ABNORMAL HIGH (ref 70–99)
Glucose-Capillary: 228 mg/dL — ABNORMAL HIGH (ref 70–99)

## 2021-04-21 LAB — BASIC METABOLIC PANEL
Anion gap: 7 (ref 5–15)
BUN: 16 mg/dL (ref 6–20)
CO2: 31 mmol/L (ref 22–32)
Calcium: 8.2 mg/dL — ABNORMAL LOW (ref 8.9–10.3)
Chloride: 102 mmol/L (ref 98–111)
Creatinine, Ser: 1.22 mg/dL (ref 0.61–1.24)
GFR, Estimated: >60 mL/min (ref >60)
Glucose, Bld: 185 mg/dL — ABNORMAL HIGH (ref 70–99)
Potassium: 4.3 mmol/L (ref 3.5–5.1)
Sodium: 140 mmol/L (ref 135–145)

## 2021-04-21 LAB — COOXEMETRY PANEL
Carboxyhemoglobin: 1.1 % (ref 0.5–1.5)
Methemoglobin: 1 % (ref 0.0–1.5)
O2 Saturation: 65.1 %
Total hemoglobin: 13.1 g/dL (ref 12.0–16.0)

## 2021-04-21 LAB — MAGNESIUM: Magnesium: 1.7 mg/dL (ref 1.7–2.4)

## 2021-04-21 LAB — TSH: TSH: 0.782 u[IU]/mL (ref 0.350–4.500)

## 2021-04-21 MED ORDER — MAGNESIUM SULFATE 4 GM/100ML IV SOLN
4.0000 g | Freq: Once | INTRAVENOUS | Status: AC
  Administered 2021-04-21: 4 g via INTRAVENOUS
  Filled 2021-04-21: qty 100

## 2021-04-21 MED ORDER — EMPAGLIFLOZIN 10 MG PO TABS
10.0000 mg | ORAL_TABLET | Freq: Every day | ORAL | Status: DC
  Administered 2021-04-21: 10 mg via ORAL

## 2021-04-21 MED ORDER — SPIRONOLACTONE 12.5 MG HALF TABLET
12.5000 mg | ORAL_TABLET | Freq: Every day | ORAL | Status: DC
  Administered 2021-04-21 – 2021-04-24 (×4): 12.5 mg via ORAL
  Filled 2021-04-21 (×3): qty 1

## 2021-04-21 NOTE — Progress Notes (Signed)
Pt. BP 87/56. Pt. Asymptomatic. On call for [facility] paged to make aware.

## 2021-04-21 NOTE — Consult Note (Signed)
WOC Nurse Consult Note: Reason for Consult:Left heel with DTPI in evolution.  Deflated blood-filled blister with skin intact, demarcation of blister remains black center. Firm, no fluctuance.n Patient states it was from "hospitalization last month". Wound type:Pressure Pressure Injury POA: Yes Measurement:3.5cm round with 2cm x 1.5cm darker area in center. Wound bed: As described above Drainage (amount, consistency, odor) none Periwound:intact, dry. Area of healed full thickness skin loss on anterior foot. No return of melanin. Dressing procedure/placement/frequency: Patient reports that area is still very tender to touch.I will provide guidance for Nursing in the topical care twice daily using a betadine swabstick and allowing to air dry, then covering (protecting) with a dry dressing. While in bed, pressure redistribution to the heel will be with pressure redistribution heel boots. Patient reports that he is "moving around better" since last admission. He is taught to turn on his side while in bed and alternate sides, minimizing time in the supine position to enhance healing of the left heel.  WOC nursing team will not follow, but will remain available to this patient, the nursing and medical teams.  Please re-consult if needed. Thanks, Ladona Mow, MSN, RN, GNP, [place]  Pager# [phone]

## 2021-04-21 NOTE — Progress Notes (Addendum)
PROGRESS NOTE        PATIENT DETAILS Name: Thomas West Age: 60 y.o. Sex: male Date of Birth: 31-Oct-1960 Admit Date: 04/19/2021 Admitting Physician Dewayne Shorter Levora Dredge, MD FIE:PPIRJJ, [name] Expose, MD  Brief Narrative: Patient is a 60 y.o. male with history of HFrEF, DM-2, HTN, HLD, CKD stage IIIa-noncompliant with follow-up and medications-presenting with at least 1 week history of exertional dyspnea, lower extremity edema-found to have decompensated systolic heart failure.  Subjective: Significant decrease in lower extremity edema-breathing has improved.  Objective: Vitals: Blood pressure 114/76, pulse 78, temperature 98.2 F (36.8 C), temperature source Oral, resp. rate 20, height 5\' 9"  (1.753 m), weight 67.1 kg, SpO2 96 %.   Exam: Gen Exam:Alert awake-not in any distress HEENT:atraumatic, normocephalic Chest: B/L clear to auscultation anteriorly CVS:S1S2 regular Abdomen:soft non tender, non distended Extremities:+ edema Neurology: Non focal Skin: no rash   Pertinent Labs/Radiology: Recent Labs  Lab 04/19/21 2359 04/20/21 0722 04/21/21 0335  WBC 6.2  --   --   HGB 13.5  --   --   PLT 326  --   --   NA 138   < > 140  K 4.1   < > 4.3  CREATININE 1.50*   < > 1.22  AST 23  --   --   ALT 16  --   --   ALKPHOS 204*  --   --   BILITOT 1.6*  --   --    < > = values in this interval not displayed.      Assessment/Plan: HFrEF with exacerbation: Suspect due to noncompliance-volume status has improved with diuresis with IV Lasix.  Remains on Lasix, Coreg, Imdur, hydralazine-was started on Entresto yesterday by cardiology.  Continue to follow electrolytes/volume status closely.  Cardiology planning on cardiac MRI.  DM-2 (A1c 11.7 on 11/28): Per patient-he was recently taken off his metformin-unclear if he was actually taking any oral medications.  He has very poor insight-do not think he would be a good candidate for long-term insulin therapy-suspect  better to stick with oral hypoglycemic agents-this can be restarted when closer to discharge.  For now-continue SSI-add low-dose Semglee.  CBG (last 3)  Recent Labs    04/21/21 0620 04/21/21 0831 04/21/21 1108  GLUCAP 173* 238* 220*     HTN: BP stable-continue Coreg/Imdur/hydralazine/Entresto and adjust  History of CVA: Continue antiplatelet/statin  Nonobstructive CAD: Continue antiplatelets.  Underwent LHC during his most recent hospitalization  CKD stage IIIa: Creatinine close to baseline-watch closely  HLD: Continue Lipitor  Noncompliance to medication/follow-up: Counseled extensively  BMI Estimated body mass index is 21.86 kg/m as calculated from the following:   Height as of this encounter: 5\' 9"  (1.753 m).   Weight as of this encounter: 67.1 kg.    Procedures: None Consults: Cardioogy DVT Prophylaxis: Lovenox Code Status:Full code  Family Communication: None at bedside  Time spent: 25 minutes-Greater than 50% of this time was spent in counseling, explanation of diagnosis, planning of further management, and coordination of care.   Disposition Plan: Status is: Observation  The patient will require care spanning > 2 midnights and should be moved to inpatient because: Grossly volume overloaded-needs IV diuretics to achieve euvolemia.   Diet: Diet Order             Diet heart healthy/carb modified Room service appropriate? Yes; Fluid consistency: Thin; Fluid  restriction: 1200 mL Fluid  Diet effective now                     Antimicrobial agents: Anti-infectives (From admission, onward)    None        MEDICATIONS: Scheduled Meds:  aspirin EC  81 mg Oral Daily   atorvastatin  80 mg Oral QPM   carvedilol  6.25 mg Oral BID   Chlorhexidine Gluconate Cloth  6 each Topical Daily   clopidogrel  75 mg Oral Daily   enoxaparin (LOVENOX) injection  40 mg Subcutaneous Q24H   hydrALAZINE  50 mg Oral Q8H   insulin aspart  0-5 Units Subcutaneous QHS    insulin aspart  0-9 Units Subcutaneous TID WC   isosorbide mononitrate  30 mg Oral Daily   sacubitril-valsartan  1 tablet Oral BID   sodium chloride flush  10-40 mL Intracatheter Q12H   spironolactone  12.5 mg Oral Daily   Continuous Infusions:  magnesium sulfate bolus IVPB 4 g (04/21/21 1226)   PRN Meds:.acetaminophen **OR** acetaminophen, hydrALAZINE, sodium chloride flush   I have personally reviewed following labs and imaging studies  LABORATORY DATA: CBC: Recent Labs  Lab 04/19/21 2359  WBC 6.2  NEUTROABS 3.5  HGB 13.5  HCT 41.4  MCV 84.1  PLT 326     Basic Metabolic Panel: Recent Labs  Lab 04/19/21 2359 04/20/21 0722 04/21/21 0335  NA 138 138 140  K 4.1 4.5 4.3  CL 102 102 102  CO2 28 28 31   GLUCOSE 182* 200* 185*  BUN 19 18 16   CREATININE 1.50* 1.43* 1.22  CALCIUM 8.2* 8.2* 8.2*  MG  --  1.8 1.7     GFR: Estimated Creatinine Clearance: 62.6 mL/min (by C-G formula based on SCr of 1.22 mg/dL).  Liver Function Tests: Recent Labs  Lab 04/19/21 2359  AST 23  ALT 16  ALKPHOS 204*  BILITOT 1.6*  PROT 6.3*  ALBUMIN 2.7*    No results for input(s): LIPASE, AMYLASE in the last 168 hours. No results for input(s): AMMONIA in the last 168 hours.  Coagulation Profile: No results for input(s): INR, PROTIME in the last 168 hours.  Cardiac Enzymes: No results for input(s): CKTOTAL, CKMB, CKMBINDEX, TROPONINI in the last 168 hours.  BNP (last 3 results) Recent Labs    02/03/21 0911  PROBNP 3,012.0*     Lipid Profile: No results for input(s): CHOL, HDL, LDLCALC, TRIG, CHOLHDL, LDLDIRECT in the last 72 hours.  Thyroid Function Tests: Recent Labs    04/21/21 0335  TSH 0.782    Anemia Panel: No results for input(s): VITAMINB12, FOLATE, FERRITIN, TIBC, IRON, RETICCTPCT in the last 72 hours.  Urine analysis:    Component Value Date/Time   COLORURINE YELLOW 03/08/2021 0246   APPEARANCEUR HAZY (A) 03/08/2021 0246   LABSPEC 1.019 03/08/2021  0246   PHURINE 5.0 03/08/2021 0246   GLUCOSEU 150 (A) 03/08/2021 0246   HGBUR NEGATIVE 03/08/2021 0246   BILIRUBINUR NEGATIVE 03/08/2021 0246   BILIRUBINUR negative 01/02/2021 1312   KETONESUR NEGATIVE 03/08/2021 0246   PROTEINUR >=300 (A) 03/08/2021 0246   UROBILINOGEN 0.2 01/02/2021 1312   NITRITE NEGATIVE 03/08/2021 0246   LEUKOCYTESUR NEGATIVE 03/08/2021 0246    Sepsis Labs: Lactic Acid, Venous    Component Value Date/Time   LATICACIDVEN 1.5 04/20/2021 1736    MICROBIOLOGY: Recent Results (from the past 240 hour(s))  Resp Panel by RT-PCR (Flu A&B, Covid) Nasopharyngeal Swab     Status: None  Collection Time: 04/20/21  5:02 AM   Specimen: Nasopharyngeal Swab; Nasopharyngeal(NP) swabs in vial transport medium  Result Value Ref Range Status   SARS Coronavirus 2 by RT PCR NEGATIVE NEGATIVE Final    Comment: (NOTE) SARS-CoV-2 target nucleic acids are NOT DETECTED.  The SARS-CoV-2 RNA is generally detectable in upper respiratory specimens during the acute phase of infection. The lowest concentration of SARS-CoV-2 viral copies this assay can detect is 138 copies/mL. A negative result does not preclude SARS-Cov-2 infection and should not be used as the sole basis for treatment or other patient management decisions. A negative result may occur with  improper specimen collection/handling, submission of specimen other than nasopharyngeal swab, presence of viral mutation(s) within the areas targeted by this assay, and inadequate number of viral copies(<138 copies/mL). A negative result must be combined with clinical observations, patient history, and epidemiological information. The expected result is Negative.  Fact Sheet for Patients:  EntrepreneurPulse.com.au  Fact Sheet for Healthcare Providers:  IncredibleEmployment.be  This test is no t yet approved or cleared by the [place] FDA and  has been authorized for detection and/or  diagnosis of SARS-CoV-2 by FDA under an Emergency Use Authorization (EUA). This EUA will remain  in effect (meaning this test can be used) for the duration of the COVID-19 declaration under Section 564(b)(1) of the Act, 21 U.S.C.section 360bbb-3(b)(1), unless the authorization is terminated  or revoked sooner.       Influenza A by PCR NEGATIVE NEGATIVE Final   Influenza B by PCR NEGATIVE NEGATIVE Final    Comment: (NOTE) The Xpert Xpress SARS-CoV-2/FLU/RSV plus assay is intended as an aid in the diagnosis of influenza from Nasopharyngeal swab specimens and should not be used as a sole basis for treatment. Nasal washings and aspirates are unacceptable for Xpert Xpress SARS-CoV-2/FLU/RSV testing.  Fact Sheet for Patients: EntrepreneurPulse.com.au  Fact Sheet for Healthcare Providers: IncredibleEmployment.be  This test is not yet approved or cleared by the [place] FDA and has been authorized for detection and/or diagnosis of SARS-CoV-2 by FDA under an Emergency Use Authorization (EUA). This EUA will remain in effect (meaning this test can be used) for the duration of the COVID-19 declaration under Section 564(b)(1) of the Act, 21 U.S.C. section 360bbb-3(b)(1), unless the authorization is terminated or revoked.  Performed at [facility], [place] [address], [place], [place] [address]     RADIOLOGY STUDIES/RESULTS: DG Chest 1 View  Result Date: 04/20/2021 CLINICAL DATA:  Chest pain and tightness. EXAM: CHEST  1 VIEW COMPARISON:  March 08, 2021 FINDINGS: Mild, stable right infrahilar linear scarring and/or atelectasis is seen. There is no evidence of a pleural effusion or pneumothorax. Mild, stable prominence of the pulmonary vasculature is seen. The cardiac silhouette is mildly enlarged and unchanged in size. Mild calcification of the aortic arch is noted. The visualized skeletal structures are unremarkable. IMPRESSION: 1. Mild, stable  right infrahilar linear scarring and/or atelectasis. 2. Stable cardiomegaly with mild pulmonary vascular congestion. Electronically Signed   By: Virgina Norfolk M.D.   On: 04/20/2021 00:59   [place] EKG SITE RITE  Result Date: 04/20/2021 If Site Rite image not attached, placement could not be confirmed due to current cardiac rhythm.  US Abdomen Limited RUQ (LIVER/GB)  Result Date: 04/20/2021 CLINICAL DATA:  Elevated liver function tests EXAM: ULTRASOUND ABDOMEN LIMITED RIGHT UPPER QUADRANT COMPARISON:  None. FINDINGS: Gallbladder: Incomplete distension which accounts for prominent wall thickness. No focal tenderness. Common bile duct: Diameter: 4 mm Liver: No focal  lesion identified. Within normal limits in parenchymal echogenicity. Portal vein is patent on color Doppler imaging with normal direction of blood flow towards the liver. Other: Small volume simple ascites in the right upper quadrant IMPRESSION: 1. Negative biliary tree. 2. Small volume right upper quadrant ascites. Electronically Signed   By: Jorje Guild M.D.   On: 04/20/2021 06:18     LOS: 1 day   Oren Binet, MD  [facility]    To contact the attending provider between 7A-7P or the covering provider during after hours 7P-7A, please log into the web site [email or web address] and access using universal [place] password for that web site. If you do not have the password, please call the hospital operator.  04/21/2021, 1:35 PM

## 2021-04-21 NOTE — Plan of Care (Signed)
  Problem: Clinical Measurements: Goal: Diagnostic test results will improve Outcome: Progressing Goal: Respiratory complications will improve Outcome: Progressing   Problem: Elimination: Goal: Will not experience complications related to urinary retention Outcome: Progressing   

## 2021-04-21 NOTE — Progress Notes (Addendum)
Advanced Heart Failure Rounding Note  PCP-Cardiologist: Jenkins Rouge, MD   Subjective:    Co-ox 65%.   Good response to IV Lasix 3L in UOP yesterday. Wt down 3 lb. CVP 3 on my read  Scr down 1.43>>1.22  K 4.3  Mg 1.7   Feels better today. Less dyspnea. Reports he was able to lay fairly flat last PM w/o orthopnea/ PND.   Objective:   Weight Range: 67.1 kg Body mass index is 21.86 kg/m.   Vital Signs:   Temp:  [97.4 F (36.3 C)-98.6 F (37 C)] 98.2 F (36.8 C) (11/29 0724) Pulse Rate:  [74-99] 78 (11/29 0724) Resp:  [0-34] 19 (11/29 0724) BP: (108-166)/(68-120) 108/77 (11/29 0858) SpO2:  [93 %-100 %] 97 % (11/29 0858) Weight:  [67.1 kg-68.9 kg] 67.1 kg (11/29 0400) Last BM Date: 04/18/21  Weight change: Filed Weights   04/20/21 0005 04/20/21 2303 04/21/21 0400  Weight: 69 kg 68.9 kg 67.1 kg    Intake/Output:   Intake/Output Summary (Last 24 hours) at 04/21/2021 1019 Last data filed at 04/21/2021 0806 Gross per 24 hour  Intake 310 ml  Output 3835 ml  Net -3525 ml      Physical Exam    CVP 3  General:  fatigued appearing. No resp difficulty HEENT: Normal Neck: Supple. JVP not elevated. Carotids 2+ bilat; no bruits. No lymphadenopathy or thyromegaly appreciated. Cor: PMI nondisplaced. Regular rate & rhythm. No rubs, gallops or murmurs. Lungs: Clear Abdomen: Soft, nontender, nondistended. No hepatosplenomegaly. No bruits or masses. Good bowel sounds. Extremities: No cyanosis, clubbing, rash, edema + RUE PICC  Neuro: Alert & orientedx3, cranial nerves grossly intact. moves all 4 extremities w/o difficulty. Affect pleasant   Telemetry   NSR 70s w/ occasional PVCs, <10/min   EKG    No new EKG to review   Labs    CBC Recent Labs    04/19/21 2359  WBC 6.2  NEUTROABS 3.5  HGB 13.5  HCT 41.4  MCV 84.1  PLT 812   Basic Metabolic Panel Recent Labs    04/20/21 0722 04/21/21 0335  NA 138 140  K 4.5 4.3  CL 102 102  CO2 28 31  GLUCOSE  200* 185*  BUN 18 16  CREATININE 1.43* 1.22  CALCIUM 8.2* 8.2*  MG 1.8 1.7   Liver Function Tests Recent Labs    04/19/21 2359  AST 23  ALT 16  ALKPHOS 204*  BILITOT 1.6*  PROT 6.3*  ALBUMIN 2.7*   No results for input(s): LIPASE, AMYLASE in the last 72 hours. Cardiac Enzymes No results for input(s): CKTOTAL, CKMB, CKMBINDEX, TROPONINI in the last 72 hours.  BNP: BNP (last 3 results) Recent Labs    03/08/21 0258 03/09/21 0304 04/19/21 2359  BNP 3,620.7* 2,895.4* 3,306.4*    ProBNP (last 3 results) Recent Labs    02/03/21 0911  PROBNP 3,012.0*     D-Dimer No results for input(s): DDIMER in the last 72 hours. Hemoglobin A1C Recent Labs    04/20/21 1100  HGBA1C 11.7*   Fasting Lipid Panel No results for input(s): CHOL, HDL, LDLCALC, TRIG, CHOLHDL, LDLDIRECT in the last 72 hours. Thyroid Function Tests Recent Labs    04/21/21 0335  TSH 0.782    Other results:   Imaging    [place] EKG SITE RITE  Result Date: 04/20/2021 If Site Rite image not attached, placement could not be confirmed due to current cardiac rhythm.    Medications:     Scheduled Medications:  aspirin EC  81 mg Oral Daily   atorvastatin  80 mg Oral QPM   carvedilol  6.25 mg Oral BID   Chlorhexidine Gluconate Cloth  6 each Topical Daily   clopidogrel  75 mg Oral Daily   empagliflozin  10 mg Oral QAC breakfast   enoxaparin (LOVENOX) injection  40 mg Subcutaneous Q24H   furosemide  60 mg Intravenous BID   hydrALAZINE  50 mg Oral Q8H   insulin aspart  0-5 Units Subcutaneous QHS   insulin aspart  0-9 Units Subcutaneous TID WC   isosorbide mononitrate  30 mg Oral Daily   sacubitril-valsartan  1 tablet Oral BID   sodium chloride flush  10-40 mL Intracatheter Q12H    Infusions:   PRN Medications: acetaminophen **OR** acetaminophen, hydrALAZINE, sodium chloride flush   Assessment/Plan   Acute on chronic systolic HF/cardiomyopathy: -Onset not certain. He reports  cardiomyopathy diagnosed just prior to MI while living in [place] a couple of years ago. -Echo 10/22 with EF 20-25%, RV okay -R/LHC 10/22: Patent RPLV stent with nonobstructive disease elsewhere, Preserved CO/CI, RA mean 5 mmHg, PCWP mean 5 mmHg - Etiology not certain. ? If due to frequent PVCs and/or uncontrolled hypertension. Denies hx drug abuse. Reports alcohol use in past, but none last few years. May need cardiac MRI this admit - Now readmitted with a/c HF. He is adamant he has been adherent with medications and reports his sister assists him with organizing them.  - NYHA 3b. Co-ox ok at 65% - Good response to IV Lasix. Breathing improved, CVP 3  - Stop IV Lasix  - Continue Entresto 24-26 mg bid - On Jardiance 10, but given Hgb A1c ~12, will discontinue - Continue Carvedilol 6.25 mg bid  - C/w Hydral/Imdur  - Add 12.5 mg of spiro  - May need to consider digoxin - Plan cMRI  - Discussed fluid restriction, fluid intake likely contributing to decompensation  - Worry about med compliance. Will refer to paramedicine post d/c    2. PVCs: -less frequent today ~10/min on telemetry -? If contributing to cardiomyopathy -May need to consider antiarrhythmic therapy, amio or mexiletine -Keep K > 4, Mag > 2   3. Chest tightness: - Likely d/t a/c HF - HS troponin negative X 2 - Recent cath with patent stent and no new obstructive disease. See below.   4. CAD: - Prior MI followed by PCI in [place] in either 2019 or 2020.  - Patent RPLV stent on [facility] 10/22. Also had 20% distal RCA, 30% p LAD, 70% d LAD, 60% OM2 - On aspirin, plavix - Increase atorvastatin to prior home dose of 80 mg    5. HTN: - Not controlled on admit. Noncompliance a concern but reports taking all of his medicines. - Improving after restarting some of his home medications   6. Uncontrolled DM: - per [facility] - would avoid SGLT2i for now as a1c is 12    7. Elevated alk phos -204 on 11/27, AST/ALT normal -[place] with RUQ ascites,  otherwise negative   8. Left heel ulcer - Looks like a pressure wound - Reports it has been present since his last admission [facility] consulted and provided guidance for nursing staff  9. Hypomagnesemia  - Mg 1.7 - supp and follow   Length of Stay: 1  Brittainy Simmons, PA-C  04/21/2021, 10:19 AM  Advanced Heart Failure Team Pager [phone] (M-F; 7a - 5p)  Please contact [facility] for night-coverage after hours (5p -7a )  and weekends on [email or web address]  Patient seen and examined with the above-signed Advanced Practice Provider and/or Housestaff. I personally reviewed laboratory data, imaging studies and relevant notes. I independently examined the patient and formulated the important aspects of the plan. I have edited the note to reflect any of my changes or salient points. I have personally discussed the plan with the patient and/or family.  Feels much better today. Says he just wants to take a nap. No SOB, orthopnea or PND. Co-ox  65%. CVP 3.   General:  Lying flat in bed No resp difficulty HEENT: normal + poor dentition  Neck: supple. no JVD. Carotids 2+ bilat; no bruits. No lymphadenopathy or thryomegaly appreciated. Cor: PMI nondisplaced. Regular rate & rhythm. No rubs, gallops or murmurs. Lungs: clear Abdomen: soft, nontender, nondistended. No hepatosplenomegaly. No bruits or masses. Good bowel sounds. Extremities: no cyanosis, clubbing, rash, edema Neuro: alert & orientedx3, cranial nerves grossly intact. moves all 4 extremities w/o difficulty. Affect pleasant  He is much improved with several doses of IV lasix. Co-ox is ok despite severe LV dysfunction. I suspect one of the main issues here is non-compliance and poor insight into his medication regimen. Will get cMRI today. Titrate GDMT. Possibly home tomorrow with Paramedicine f/u. Not candidate for advanced therapies with poorly controlled DM2 and poor compliance.   Glori Bickers, MD  3:14 PM

## 2021-04-21 NOTE — Progress Notes (Signed)
Heart Failure Navigator Progress Note  Assessed for Heart & Vascular TOC clinic readiness.  Patient does not meet criteria due to AHF rounding team consulted this hospitalization.   Navigator available for reassessment of patient.   Mekia Dipinto, MSN, RN Heart Failure Nurse Navigator [phone]   

## 2021-04-22 ENCOUNTER — Inpatient Hospital Stay (HOSPITAL_COMMUNITY): Payer: Medicare Other

## 2021-04-22 DIAGNOSIS — I10 Essential (primary) hypertension: Secondary | ICD-10-CM | POA: Diagnosis not present

## 2021-04-22 DIAGNOSIS — I5023 Acute on chronic systolic (congestive) heart failure: Secondary | ICD-10-CM | POA: Diagnosis not present

## 2021-04-22 DIAGNOSIS — E785 Hyperlipidemia, unspecified: Secondary | ICD-10-CM | POA: Diagnosis not present

## 2021-04-22 DIAGNOSIS — I509 Heart failure, unspecified: Secondary | ICD-10-CM

## 2021-04-22 DIAGNOSIS — E1169 Type 2 diabetes mellitus with other specified complication: Secondary | ICD-10-CM | POA: Diagnosis not present

## 2021-04-22 DIAGNOSIS — I5043 Acute on chronic combined systolic (congestive) and diastolic (congestive) heart failure: Secondary | ICD-10-CM | POA: Diagnosis not present

## 2021-04-22 LAB — GLUCOSE, CAPILLARY
Glucose-Capillary: 167 mg/dL — ABNORMAL HIGH (ref 70–99)
Glucose-Capillary: 187 mg/dL — ABNORMAL HIGH (ref 70–99)
Glucose-Capillary: 263 mg/dL — ABNORMAL HIGH (ref 70–99)
Glucose-Capillary: 215 mg/dL — ABNORMAL HIGH (ref 70–99)

## 2021-04-22 LAB — COOXEMETRY PANEL
Carboxyhemoglobin: 1.2 % (ref 0.5–1.5)
Methemoglobin: 1 % (ref 0.0–1.5)
O2 Saturation: 62.7 %
Total hemoglobin: 13.7 g/dL (ref 12.0–16.0)

## 2021-04-22 LAB — BASIC METABOLIC PANEL
Anion gap: 5 (ref 5–15)
BUN: 16 mg/dL (ref 6–20)
CO2: 32 mmol/L (ref 22–32)
Calcium: 8.1 mg/dL — ABNORMAL LOW (ref 8.9–10.3)
Chloride: 100 mmol/L (ref 98–111)
Creatinine, Ser: 1.5 mg/dL — ABNORMAL HIGH (ref 0.61–1.24)
GFR, Estimated: 54 mL/min — ABNORMAL LOW (ref >60)
Glucose, Bld: 186 mg/dL — ABNORMAL HIGH (ref 70–99)
Potassium: 4.1 mmol/L (ref 3.5–5.1)
Sodium: 137 mmol/L (ref 135–145)

## 2021-04-22 MED ORDER — GADOBUTROL 1 MMOL/ML IV SOLN
7.0000 mL | Freq: Once | INTRAVENOUS | Status: AC | PRN
  Administered 2021-04-22: 7 mL via INTRAVENOUS

## 2021-04-22 NOTE — Evaluation (Signed)
Physical Therapy Evaluation Patient Details Name: Thomas West MRN: [id number] DOB: [birth year] Today's Date: 04/22/2021  History of Present Illness  The pt is a 60 yo male presenting 11/28 with SOB, LE edema, and chest tightness. Upon work-up, pt found to have acute CHF exacerbation.  Of note, pt with admission ~1 month ago for similar sx. PMH includes: CHF, CAD, DM II, MI, HTN, stroke, peripherla neuropathy, CKD II, and HLD.   Clinical Impression  Pt in bed upon arrival of PT, agreeable to evaluation at this time. Prior to admission the pt was independent with use of 4-wheel walker in the home other than needing assist from his son to navigate the flight of stairs in his home. The pt now presents with limitations in functional mobility, strength, power, activity tolerance, and dynamic stability due to above dx, and will continue to benefit from skilled PT to address these deficits. The pt was able to complete short bout of ambulation in the room with use of minA through HHA due to pain in LLE with wt bearing. He was able to complete sit-stand transfers with use of BUE to power up, but demos decreased power in BLE to complete the transfer at this time. Will continue to benefit from skilled PT acutely and possibly HHPT pending progression to facilitate maximal functional recovery and independence with gait prior to return home.      Recommendations for follow up therapy are one component of a multi-disciplinary discharge planning process, led by the attending physician.  Recommendations may be updated based on patient status, additional functional criteria and insurance authorization.  Follow Up Recommendations Home health PT (vs no PT pending progression)    Assistance Recommended at Discharge Intermittent Supervision/Assistance  Functional Status Assessment Patient has had a recent decline in their functional status and demonstrates the ability to make significant improvements in function in a  reasonable and predictable amount of time.  Equipment Recommendations  None recommended by PT    Recommendations for Other Services       Precautions / Restrictions Precautions Precautions: None Required Braces or Orthoses: Other Brace Other Brace: prevalon boot to LLE Restrictions Weight Bearing Restrictions: No      Mobility  Bed Mobility Overal bed mobility: Modified Independent             General bed mobility comments: no assist needed to completed, but slowed movements    Transfers Overall transfer level: Needs assistance Equipment used: 1 person hand held assist Transfers: Sit to/from Stand Sit to Stand: Min guard           General transfer comment: pt able to power up with use of BUE pushing from bed/chair. no assist to steady in standing    Ambulation/Gait Ambulation/Gait assistance: Min assist Gait Distance (Feet): 25 Feet Assistive device: 1 person hand held assist Gait Pattern/deviations: Step-to pattern;Antalgic Gait velocity: decreased Gait velocity interpretation: <1.31 ft/sec, indicative of household ambulator   General Gait Details: pt with limited wt bearing on LLE due to pain from heel wound. minA through HHA to off-weight as no RW in his room.      Balance Overall balance assessment: Needs assistance Sitting-balance support: No upper extremity supported Sitting balance-Leahy Scale: Fair     Standing balance support: Single extremity supported;During functional activity Standing balance-Leahy Scale: Fair Standing balance comment: single UE support for pain in LLE.  Pertinent Vitals/Pain Pain Assessment: Faces Faces Pain Scale: Hurts even more Pain Location: abdomen Pain Descriptors / Indicators: Discomfort Pain Intervention(s): Limited activity within patient's tolerance;Monitored during session    Home Living Family/patient expects to be discharged to:: Private residence Living  Arrangements: Children (67 yo son) Available Help at Discharge: Family;Available PRN/intermittently Type of Home: House Home Access: Stairs to enter Entrance Stairs-Rails: None Entrance Stairs-Number of Steps: 1   Home Layout: Two level;Bed/bath upstairs Home Equipment: Rollator (4 wheels);BSC/3in1      Prior Function Prior Level of Function : Needs assist       Physical Assist : Mobility (physical) Mobility (physical): Stairs   Mobility Comments: assist from son to navigate 1 flight of stairs at home ADLs Comments: assist to don socks/LB dressing     Hand Dominance   Dominant Hand: Right    Extremity/Trunk Assessment   Upper Extremity Assessment Upper Extremity Assessment: Overall WFL for tasks assessed    Lower Extremity Assessment Lower Extremity Assessment: LLE deficits/detail LLE Deficits / Details: pt with wound on heel covered by bandages. prevalon boots for bed. pt able to demo full ROM and good strength at knee and hip    Cervical / Trunk Assessment Cervical / Trunk Assessment: Normal  Communication   Communication: No difficulties  Cognition Arousal/Alertness: Awake/alert Behavior During Therapy: WFL for tasks assessed/performed Overall Cognitive Status: Within Functional Limits for tasks assessed                                 General Comments: pt following all commands, able to answer PLOF questions appropriately        General Comments General comments (skin integrity, edema, etc.): VSS on RA    Exercises     Assessment/Plan    PT Assessment Patient needs continued PT services  PT Problem List Decreased strength;Decreased range of motion;Decreased activity tolerance;Decreased balance;Decreased mobility;Pain       PT Treatment Interventions DME instruction;Gait training;Stair training;Functional mobility training;Therapeutic activities;Therapeutic exercise;Balance training;Patient/family education    PT Goals (Current goals  can be found in the Care Plan section)  Acute Rehab PT Goals Patient Stated Goal: return home, get back to bowling PT Goal Formulation: With patient Time For Goal Achievement: 05/06/21 Potential to Achieve Goals: Good    Frequency Min 3X/week    AM-PAC PT "6 Clicks" Mobility  Outcome Measure Help needed turning from your back to your side while in a flat bed without using bedrails?: None Help needed moving from lying on your back to sitting on the side of a flat bed without using bedrails?: A Little Help needed moving to and from a bed to a chair (including a wheelchair)?: A Little Help needed standing up from a chair using your arms (e.g., wheelchair or bedside chair)?: A Little Help needed to walk in hospital room?: A Little Help needed climbing 3-5 steps with a railing? : A Little 6 Click Score: 19    End of Session   Activity Tolerance: Patient tolerated treatment well Patient left: in chair;with call bell/phone within reach;with chair alarm set Nurse Communication: Mobility status PT Visit Diagnosis: Other abnormalities of gait and mobility (R26.89);Muscle weakness (generalized) (M62.81);Pain Pain - Right/Left: Left Pain - part of body: Ankle and joints of foot    Time: 1137-1210 PT Time Calculation (min) (ACUTE ONLY): 33 min   Charges:   PT Evaluation $PT Eval Low Complexity: 1 Low PT Treatments $Gait Training: 8-22  mins        Vickki Muff, PT, DPT   Acute Rehabilitation Department Pager #: [phone]  Ronnie Derby 04/22/2021, 12:24 PM

## 2021-04-22 NOTE — Progress Notes (Addendum)
Advanced Heart Failure Rounding Note  PCP-Cardiologist: Jenkins Rouge, MD   Subjective:    Co-ox 63%  CVP 6  IV lasix stopped 11/29. -2.2L yesterday. Weight down another 4 lb this am.   SBP mostly low 100s, dips to 90s  Feels okay today. Dyspnea and LE edema much improved. Reports left heel pain at site of wound   Objective:   Weight Range: 65.3 kg Body mass index is 21.27 kg/m.   Vital Signs:   Temp:  [97.8 F (36.6 C)-98.2 F (36.8 C)] 98 F (36.7 C) (11/30 0800) Pulse Rate:  [77-88] 83 (11/30 0800) Resp:  [14-20] 15 (11/30 0800) BP: (87-118)/(56-79) 118/76 (11/30 0800) SpO2:  [89 %-98 %] 97 % (11/30 0800) Weight:  [65.3 kg] 65.3 kg (11/30 0600) Last BM Date: 04/20/21  Weight change: Filed Weights   04/20/21 2303 04/21/21 0400 04/22/21 0600  Weight: 68.9 kg 67.1 kg 65.3 kg    Intake/Output:   Intake/Output Summary (Last 24 hours) at 04/22/2021 0828 Last data filed at 04/22/2021 3094 Gross per 24 hour  Intake 840 ml  Output 3350 ml  Net -2510 ml      Physical Exam    CVP 6 General:  Fatigued appearing. Looks chronically ill. HEENT: normal Neck: JVP 6-7 cm. Carotids 2+ bilat; no bruits. No lymphadenopathy or thryomegaly appreciated. Cor: PMI nondisplaced. Regular rate & rhythm. No rubs, gallops or murmurs. Lungs: clear Abdomen: soft, nontender, nondistended. No hepatosplenomegaly. No bruits or masses. Good bowel sounds. Extremities: no cyanosis, clubbing, rash, trace edema, boot LLE Neuro: alert & orientedx3, cranial nerves grossly intact. moves all 4 extremities w/o difficulty. Affect pleasant    Telemetry   NSR 80s, ~ 4 - 7 PVCs/min  Labs    CBC Recent Labs    04/19/21 2359  WBC 6.2  NEUTROABS 3.5  HGB 13.5  HCT 41.4  MCV 84.1  PLT 076   Basic Metabolic Panel Recent Labs    04/20/21 0722 04/21/21 0335 04/22/21 0300  NA 138 140 137  K 4.5 4.3 4.1  CL 102 102 100  CO2 28 31 32  GLUCOSE 200* 185* 186*  BUN _0 CREATININE 1.43* 1.22 1.50*  CALCIUM 8.2* 8.2* 8.1*  MG 1.8 1.7  --    Liver Function Tests Recent Labs    04/19/21 2359  AST 23  ALT 16  ALKPHOS 204*  BILITOT 1.6*  PROT 6.3*  ALBUMIN 2.7*   No results for input(s): LIPASE, AMYLASE in the last 72 hours. Cardiac Enzymes No results for input(s): CKTOTAL, CKMB, CKMBINDEX, TROPONINI in the last 72 hours.  BNP: BNP (last 3 results) Recent Labs    03/08/21 0258 03/09/21 0304 04/19/21 2359  BNP 3,620.7* 2,895.4* 3,306.4*    ProBNP (last 3 results) Recent Labs    02/03/21 0911  PROBNP 3,012.0*     D-Dimer No results for input(s): DDIMER in the last 72 hours. Hemoglobin A1C Recent Labs    04/20/21 1100  HGBA1C 11.7*   Fasting Lipid Panel No results for input(s): CHOL, HDL, LDLCALC, TRIG, CHOLHDL, LDLDIRECT in the last 72 hours. Thyroid Function Tests Recent Labs    04/21/21 0335  TSH 0.782    Other results:   Imaging    No results found.   Medications:     Scheduled Medications:  aspirin EC  81 mg Oral Daily   atorvastatin  80 mg Oral QPM   carvedilol  6.25 mg Oral BID   Chlorhexidine Gluconate Cloth  6 each Topical Daily   clopidogrel  75 mg Oral Daily   enoxaparin (LOVENOX) injection  40 mg Subcutaneous Q24H   hydrALAZINE  50 mg Oral Q8H   insulin aspart  0-5 Units Subcutaneous QHS   insulin aspart  0-9 Units Subcutaneous TID WC   isosorbide mononitrate  30 mg Oral Daily   sacubitril-valsartan  1 tablet Oral BID   sodium chloride flush  10-40 mL Intracatheter Q12H   spironolactone  12.5 mg Oral Daily    Infusions:   PRN Medications: acetaminophen **OR** acetaminophen, hydrALAZINE, sodium chloride flush   Assessment/Plan   Acute on chronic systolic HF/cardiomyopathy: -Onset not certain. He reports cardiomyopathy diagnosed just prior to MI while living in [place] a couple of years ago. -Echo 10/22 with EF 20-25%, RV okay -R/LHC 10/22: Patent RPLV stent with nonobstructive disease  elsewhere, Preserved CO/CI, RA mean 5 mmHg, PCWP mean 5 mmHg - Etiology not certain. ? If due to frequent PVCs and/or uncontrolled hypertension. Denies hx drug abuse. Reports alcohol use in past, but none last few years.  - Now readmitted with a/c HF. He is adamant he has been adherent with medications and reports his sister assists him with organizing them.  - NYHA 3b. Co-ox ok at 65% - Off IV lasix. CVP 6. Down total of 8 lb.  Will likely need to add po diuretic prior to discharge. - Continue Entresto 24-26 mg bid - Remain off SGLT2i d/t A1c 12 - Continue Carvedilol 6.25 mg bid  - C/w Hydral/Imdur  - Spiro 12.5 mg daily - Hold off on titrating medications further d/t soft BP - cMRI this admit - Discussed fluid restriction, fluid intake likely contributing to decompensation  - Worry about med compliance as above. Discussed referral for paramedicine at discharge. He is adamant that he does not want any assistance at home. Believes he already has enough help from daughter and son. Concerned about risk of readmission.   2. PVCs: -? If contributing to cardiomyopathy -May need to consider antiarrhythmic therapy, amio or mexiletine -Less frequent PVCs on tele today, < 10/min -Keep K > 4, Mag > 2   3. Chest tightness: - Likely d/t a/c HF - HS troponin negative X 2 - Recent cath with patent stent and no new obstructive disease. See below.   4. CAD: - Prior MI followed by PCI in [place] in either 2019 or 2020.  - Patent RPLV stent on [facility] 10/22. Also had 20% distal RCA, 30% p LAD, 70% d LAD, 60% OM2 - On aspirin, plavix - Continue atorvastatin 80 mg daily   5. HTN: - Not controlled on admit, now improved. - Noncompliance a concern but reports taking all of his medicines. Sister arranges medicines per his report but does not know any of the names  6. Uncontrolled DM: - per TRH - would avoid SGLT2i for now as a1c is 12    7. Elevated alk phos -204 on 11/27, AST/ALT normal -[place] with RUQ  ascites, otherwise negative   8. Left heel ulcer - Looks like a pressure wound - Reports it has been present since his last admission [facility] consulted and provided guidance for nursing staff  9. Hypomagnesemia  - Mg 1.7 - Replaced 11/29 - Recheck today   Need to mobilize CR PT/OT consult  SDOH:  Recommended paramedicine referral at discharge as above d/t concerns about compliance and high risk for readmission. He declines any home assistance. States it is not neccessary.   Length of  Stay: 2  FINCH, LINDSAY N, PA-C  04/22/2021, 8:28 AM  Advanced Heart Failure Team Pager [phone] (M-F; 7a - 5p)  Please contact [facility] for night-coverage after hours (5p -7a ) and weekends on [email or web address]  Patient seen and examined with the above-signed Advanced Practice Provider and/or Housestaff. I personally reviewed laboratory data, imaging studies and relevant notes. I independently examined the patient and formulated the important aspects of the plan. I have edited the note to reflect any of my changes or salient points. I have personally discussed the plan with the patient and/or family.  Volume status improved. Co-ox ok at 63% BP soft C/o heel pain  CMRI LVEF 24% RVEF 21% LGE and ECW suggestive of cardiac amyloidosis.   General:  Lying in bed No resp difficulty HEENT: normal Neck: supple. no JVD. Carotids 2+ bilat; no bruits. No lymphadenopathy or thryomegaly appreciated. Cor: PMI laterally displaced. Regular rate & rhythm. No rubs, gallops or murmurs. Lungs: clear Abdomen: soft, nontender, nondistended. No hepatosplenomegaly. No bruits or masses. Good bowel sounds. Extremities: no cyanosis, clubbing, rash, edema Neuro: alert & orientedx3, cranial nerves grossly intact. moves all 4 extremities w/o difficulty. Affect pleasant  He is likely close to end-stage. I suspect this may be about as good as we can get him. He is refusing Paramedicine for closer f/u of his HF.   CMRI  suggests cardiac amyoloidosis. Will check myeloma panel and PYP.   He is not candidate for advanced therapies with his noncompliance.  Glori Bickers, MD  7:35 PM

## 2021-04-22 NOTE — Progress Notes (Signed)
PROGRESS NOTE        PATIENT DETAILS Name: Thomas West Age: 60 y.o. Sex: male Date of Birth: 1961-03-22 Admit Date: 04/19/2021 Admitting Physician Dewayne Shorter Levora Dredge, MD YIR:SWNIOE, [name] Expose, MD  Brief Narrative: Patient is a 60 y.o. male with history of HFrEF, DM-2, HTN, HLD, CKD stage IIIa-noncompliant with follow-up and medications-presenting with at least 1 week history of exertional dyspnea, lower extremity edema-found to have decompensated systolic heart failure.  Subjective: Much improved-hardly any lower extremity edema today.  Objective: Vitals: Blood pressure 118/76, pulse 83, temperature 98 F (36.7 C), temperature source Oral, resp. rate 15, height 5\' 9"  (1.753 m), weight 65.3 kg, SpO2 97 %.   Exam: Gen Exam:Alert awake-not in any distress HEENT:atraumatic, normocephalic Chest: B/L clear to auscultation anteriorly CVS:S1S2 regular Abdomen:soft non tender, non distended Extremities:trace edema Neurology: Non focal Skin: no rash   Pertinent Labs/Radiology: Recent Labs  Lab 04/19/21 2359 04/20/21 0722 04/22/21 0300  WBC 6.2  --   --   HGB 13.5  --   --   PLT 326  --   --   NA 138   < > 137  K 4.1   < > 4.1  CREATININE 1.50*   < > 1.50*  AST 23  --   --   ALT 16  --   --   ALKPHOS 204*  --   --   BILITOT 1.6*  --   --    < > = values in this interval not displayed.      Assessment/Plan: HFrEF with exacerbation: Suspect due to noncompliance-volume status is markedly improved with IV diuretics-remains on Lasix/Coreg/Imdur/hydralazine/Entresto-await further recommendations from cardiology.  Cardiology planning on cardiac MRI.    DM-2 (A1c 11.7 on 11/28): CBGs relatively stable with SSI-he does not want to be started on insulin-furthermore he has very poor insight to his medical issues-he has been noncompliant with medications.  He thinks he was recently on metformin but was taken off by one of his outpatient providers.  Continue  SSI-once closer to discharge-we will start metformin and other hypoglycemic agents.   CBG (last 3)  Recent Labs    04/21/21 1533 04/21/21 2107 04/22/21 0617  GLUCAP 223* 228* 167*      HTN: BP stable-continue Coreg/Imdur/hydralazine/Entresto   History of CVA: Continue antiplatelet/statin  Nonobstructive CAD: Continue antiplatelets.  Underwent LHC during his most recent hospitalization  CKD stage IIIa: Creatinine close to baseline-watch closely  HLD: Continue Lipitor  Noncompliance to medication/follow-up: Counseled extensively  BMI Estimated body mass index is 21.27 kg/m as calculated from the following:   Height as of this encounter: 5\' 9"  (1.753 m).   Weight as of this encounter: 65.3 kg.    Procedures: None Consults: Cardioogy DVT Prophylaxis: Lovenox Code Status:Full code  Family Communication: None at bedside  Time spent: 25 minutes-Greater than 50% of this time was spent in counseling, explanation of diagnosis, planning of further management, and coordination of care.   Disposition Plan: Status is: Observation  The patient will require care spanning > 2 midnights and should be moved to inpatient because: Grossly volume overloaded-needs IV diuretics to achieve euvolemia.   Diet: Diet Order             Diet heart healthy/carb modified Room service appropriate? Yes; Fluid consistency: Thin; Fluid restriction: 1200 mL Fluid  Diet effective now  Antimicrobial agents: Anti-infectives (From admission, onward)    None        MEDICATIONS: Scheduled Meds:  aspirin EC  81 mg Oral Daily   atorvastatin  80 mg Oral QPM   carvedilol  6.25 mg Oral BID   Chlorhexidine Gluconate Cloth  6 each Topical Daily   clopidogrel  75 mg Oral Daily   enoxaparin (LOVENOX) injection  40 mg Subcutaneous Q24H   hydrALAZINE  50 mg Oral Q8H   insulin aspart  0-5 Units Subcutaneous QHS   insulin aspart  0-9 Units Subcutaneous TID WC   isosorbide  mononitrate  30 mg Oral Daily   sacubitril-valsartan  1 tablet Oral BID   sodium chloride flush  10-40 mL Intracatheter Q12H   spironolactone  12.5 mg Oral Daily   Continuous Infusions:  PRN Meds:.acetaminophen **OR** acetaminophen, hydrALAZINE, sodium chloride flush   I have personally reviewed following labs and imaging studies  LABORATORY DATA: CBC: Recent Labs  Lab 04/19/21 2359  WBC 6.2  NEUTROABS 3.5  HGB 13.5  HCT 41.4  MCV 84.1  PLT 326     Basic Metabolic Panel: Recent Labs  Lab 04/19/21 2359 04/20/21 0722 04/21/21 0335 04/22/21 0300  NA 138 138 140 137  K 4.1 4.5 4.3 4.1  CL 102 102 102 100  CO2 28 28 31  32  GLUCOSE 182* 200* 185* 186*  BUN 19 18 16 16   CREATININE 1.50* 1.43* 1.22 1.50*  CALCIUM 8.2* 8.2* 8.2* 8.1*  MG  --  1.8 1.7  --      GFR: Estimated Creatinine Clearance: 49.6 mL/min (A) (by C-G formula based on SCr of 1.5 mg/dL (H)).  Liver Function Tests: Recent Labs  Lab 04/19/21 2359  AST 23  ALT 16  ALKPHOS 204*  BILITOT 1.6*  PROT 6.3*  ALBUMIN 2.7*    No results for input(s): LIPASE, AMYLASE in the last 168 hours. No results for input(s): AMMONIA in the last 168 hours.  Coagulation Profile: No results for input(s): INR, PROTIME in the last 168 hours.  Cardiac Enzymes: No results for input(s): CKTOTAL, CKMB, CKMBINDEX, TROPONINI in the last 168 hours.  BNP (last 3 results) Recent Labs    02/03/21 0911  PROBNP 3,012.0*     Lipid Profile: No results for input(s): CHOL, HDL, LDLCALC, TRIG, CHOLHDL, LDLDIRECT in the last 72 hours.  Thyroid Function Tests: Recent Labs    04/21/21 0335  TSH 0.782     Anemia Panel: No results for input(s): VITAMINB12, FOLATE, FERRITIN, TIBC, IRON, RETICCTPCT in the last 72 hours.  Urine analysis:    Component Value Date/Time   COLORURINE YELLOW 03/08/2021 0246   APPEARANCEUR HAZY (A) 03/08/2021 0246   LABSPEC 1.019 03/08/2021 0246   PHURINE 5.0 03/08/2021 0246   GLUCOSEU  150 (A) 03/08/2021 0246   HGBUR NEGATIVE 03/08/2021 0246   BILIRUBINUR NEGATIVE 03/08/2021 0246   BILIRUBINUR negative 01/02/2021 1312   KETONESUR NEGATIVE 03/08/2021 0246   PROTEINUR >=300 (A) 03/08/2021 0246   UROBILINOGEN 0.2 01/02/2021 1312   NITRITE NEGATIVE 03/08/2021 0246   LEUKOCYTESUR NEGATIVE 03/08/2021 0246    Sepsis Labs: Lactic Acid, Venous    Component Value Date/Time   LATICACIDVEN 1.5 04/20/2021 1736    MICROBIOLOGY: Recent Results (from the past 240 hour(s))  Resp Panel by RT-PCR (Flu A&B, Covid) Nasopharyngeal Swab     Status: None   Collection Time: 04/20/21  5:02 AM   Specimen: Nasopharyngeal Swab; Nasopharyngeal(NP) swabs in vial transport medium  Result Value Ref Range  Status   SARS Coronavirus 2 by RT PCR NEGATIVE NEGATIVE Final    Comment: (NOTE) SARS-CoV-2 target nucleic acids are NOT DETECTED.  The SARS-CoV-2 RNA is generally detectable in upper respiratory specimens during the acute phase of infection. The lowest concentration of SARS-CoV-2 viral copies this assay can detect is 138 copies/mL. A negative result does not preclude SARS-Cov-2 infection and should not be used as the sole basis for treatment or other patient management decisions. A negative result may occur with  improper specimen collection/handling, submission of specimen other than nasopharyngeal swab, presence of viral mutation(s) within the areas targeted by this assay, and inadequate number of viral copies(<138 copies/mL). A negative result must be combined with clinical observations, patient history, and epidemiological information. The expected result is Negative.  Fact Sheet for Patients:  EntrepreneurPulse.com.au  Fact Sheet for Healthcare Providers:  IncredibleEmployment.be  This test is no t yet approved or cleared by the [place] FDA and  has been authorized for detection and/or diagnosis of SARS-CoV-2 by FDA under an Emergency  Use Authorization (EUA). This EUA will remain  in effect (meaning this test can be used) for the duration of the COVID-19 declaration under Section 564(b)(1) of the Act, 21 U.S.C.section 360bbb-3(b)(1), unless the authorization is terminated  or revoked sooner.       Influenza A by PCR NEGATIVE NEGATIVE Final   Influenza B by PCR NEGATIVE NEGATIVE Final    Comment: (NOTE) The Xpert Xpress SARS-CoV-2/FLU/RSV plus assay is intended as an aid in the diagnosis of influenza from Nasopharyngeal swab specimens and should not be used as a sole basis for treatment. Nasal washings and aspirates are unacceptable for Xpert Xpress SARS-CoV-2/FLU/RSV testing.  Fact Sheet for Patients: EntrepreneurPulse.com.au  Fact Sheet for Healthcare Providers: IncredibleEmployment.be  This test is not yet approved or cleared by the [place] FDA and has been authorized for detection and/or diagnosis of SARS-CoV-2 by FDA under an Emergency Use Authorization (EUA). This EUA will remain in effect (meaning this test can be used) for the duration of the COVID-19 declaration under Section 564(b)(1) of the Act, 21 U.S.C. section 360bbb-3(b)(1), unless the authorization is terminated or revoked.  Performed at [facility], [place] [address] [place], [place] [address]     RADIOLOGY STUDIES/RESULTS: [place] EKG SITE RITE  Result Date: 04/20/2021 If Site Rite image not attached, placement could not be confirmed due to current cardiac rhythm.    LOS: 2 days   Oren Binet, MD  [facility]    To contact the attending provider between 7A-7P or the covering provider during after hours 7P-7A, please log into the web site [email or web address] and access using universal [place] password for that web site. If you do not have the password, please call the hospital operator.  04/22/2021, 8:59 AM

## 2021-04-22 NOTE — Evaluation (Signed)
Occupational Therapy Evaluation Patient Details Name: Thomas West MRN: [id number] DOB: 11-13-1960 Today's Date: 04/22/2021   History of Present Illness The pt is a 60 yo male presenting 11/28 with SOB, LE edema, and chest tightness. Upon work-up, pt found to have acute CHF exacerbation.  Of note, pt with admission ~1 month ago for similar sx. PMH includes: CHF, CAD, DM II, MI, HTN, stroke, peripherla neuropathy, CKD II, and HLD.   Clinical Impression   Pt was ambulating independently, assisted for navigating stairs and for LB ADL by his 62 year old son prior to admission. Review of chart from last admission revealed difficulty with vision and cognition. Pt attempting to go back to bed without regard for putting footrest of chair down or regard for lines or lack of RW. Pt requires min guard assist for mobility and up to min assist for ADL. Will follow acutely.      Recommendations for follow up therapy are one component of a multi-disciplinary discharge planning process, led by the attending physician.  Recommendations may be updated based on patient status, additional functional criteria and insurance authorization.   Follow Up Recommendations  Home health OT    Assistance Recommended at Discharge Intermittent Supervision/Assistance  Functional Status Assessment  Patient has had a recent decline in their functional status and demonstrates the ability to make significant improvements in function in a reasonable and predictable amount of time.  Equipment Recommendations  None recommended by OT    Recommendations for Other Services       Precautions / Restrictions Precautions Precautions: Fall Required Braces or Orthoses: Other Brace Other Brace: prevalon boot to LLE Restrictions Weight Bearing Restrictions: No      Mobility Bed Mobility Overal bed mobility: Modified Independent             General bed mobility comments: return to supine without assist     Transfers Overall transfer level: Needs assistance Equipment used: Rolling walker (2 wheels) Transfers: Sit to/from Stand Sit to Stand: Min guard           General transfer comment: min guard for safety      Balance Overall balance assessment: Needs assistance Sitting-balance support: No upper extremity supported Sitting balance-Leahy Scale: Good     Standing balance support: Bilateral upper extremity supported Standing balance-Leahy Scale: Poor Standing balance comment: B UE support                           ADL either performed or assessed with clinical judgement   ADL Overall ADL's : Needs assistance/impaired Eating/Feeding: Independent;Sitting   Grooming: Set up;Sitting   Upper Body Bathing: Min guard;Sitting   Lower Body Bathing: Sit to/from stand;Minimal assistance   Upper Body Dressing : Set up;Sitting   Lower Body Dressing: Sit to/from stand;Minimal assistance   Toilet Transfer: Min guard;Ambulation;Rolling walker (2 wheels)   Toileting- Clothing Manipulation and Hygiene: Min guard;Sit to/from stand       Functional mobility during ADLs: Min guard;Rolling walker (2 wheels) General ADL Comments: pt typically relies on son to help with LB ADL     Vision   Additional Comments: needs further assessment, last hospitalization, pt unable to read pill box test labels     Perception     Praxis      Pertinent Vitals/Pain Pain Assessment: Faces Faces Pain Scale: Hurts little more Pain Location: L LE Pain Descriptors / Indicators: Discomfort;Grimacing;Guarding Pain Intervention(s): Monitored during session;Repositioned  Hand Dominance Right   Extremity/Trunk Assessment Upper Extremity Assessment Upper Extremity Assessment: Overall WFL for tasks assessed   Lower Extremity Assessment Lower Extremity Assessment: Defer to PT evaluation LLE Deficits / Details: wound on heel with prevalon boot   Cervical / Trunk Assessment Cervical /  Trunk Assessment: Normal   Communication Communication Communication: No difficulties   Cognition Arousal/Alertness: Awake/alert Behavior During Therapy: Impulsive Overall Cognitive Status: Impaired/Different from baseline Area of Impairment: Safety/judgement                         Safety/Judgement: Decreased awareness of safety     General Comments: pt attempting to stand without putting leg rest down on recliner or having RW, needs formal cognitive screening     General Comments  VSS on RA    Exercises     Shoulder Instructions      Home Living Family/patient expects to be discharged to:: Private residence Living Arrangements: Children (66 year old son) Available Help at Discharge: Family;Available PRN/intermittently Type of Home: House Home Access: Stairs to enter [employer] of Steps: 1 Entrance Stairs-Rails: None Home Layout: Two level;Bed/bath upstairs     Bathroom Shower/Tub: Occupational psychologist: Standard     Home Equipment: Rollator (4 wheels);BSC/3in1          Prior Functioning/Environment Prior Level of Function : Needs assist       Physical Assist : Mobility (physical) Mobility (physical): Stairs   Mobility Comments: assist from son to navigate 1 flight of stairs at home ADLs Comments: assist to don socks/LB dressing        OT Problem List: Impaired balance (sitting and/or standing);Decreased safety awareness;Decreased knowledge of use of DME or AE;Pain      OT Treatment/Interventions: Self-care/ADL training;DME and/or AE instruction;Patient/family education;Balance training;Therapeutic activities    OT Goals(Current goals can be found in the care plan section) Acute Rehab OT Goals OT Goal Formulation: With patient Time For Goal Achievement: 05/06/21 Potential to Achieve Goals: Good ADL Goals Pt Will Perform Grooming: with modified independence;standing Pt Will Perform Lower Body Bathing: with modified  independence;sit to/from stand Pt Will Perform Lower Body Dressing: with modified independence;sit to/from stand Pt Will Transfer to Toilet: with modified independence;ambulating Pt Will Perform Toileting - Clothing Manipulation and hygiene: with modified independence;sit to/from stand Additional ADL Goal #1: Pt will participate in formal cognitive screening.  OT Frequency: Min 2X/week   Barriers to D/C:            Co-evaluation              AM-PAC OT "6 Clicks" Daily Activity     Outcome Measure Help from another person eating meals?: None Help from another person taking care of personal grooming?: A Little Help from another person toileting, which includes using toliet, bedpan, or urinal?: A Little Help from another person bathing (including washing, rinsing, drying)?: A Little Help from another person to put on and taking off regular upper body clothing?: A Little Help from another person to put on and taking off regular lower body clothing?: A Little 6 Click Score: 19   End of Session Equipment Utilized During Treatment: Rolling walker (2 wheels)  Activity Tolerance: Patient tolerated treatment well Patient left: in bed;with call bell/phone within reach;with nursing/sitter in room  OT Visit Diagnosis: Unsteadiness on feet (R26.81);Other abnormalities of gait and mobility (R26.89);Pain;Muscle weakness (generalized) (M62.81);Other symptoms and signs involving cognitive function Pain - Right/Left: Left Pain -  part of body: Ankle and joints of foot                Time: 1340-1400 OT Time Calculation (min): 20 min Charges:  OT General Charges $OT Visit: 1 Visit OT Evaluation $OT Eval Moderate Complexity: 1 Mod  Martie Round, OTR/L Acute Rehabilitation Services Pager: [phone] Office: [phone]   Evern Bio 04/22/2021, 2:25 PM

## 2021-04-22 NOTE — Plan of Care (Signed)
?  Problem: Clinical Measurements: ?Goal: Respiratory complications will improve ?Outcome: Progressing ?  ?Problem: Activity: ?Goal: Risk for activity intolerance will decrease ?Outcome: Progressing ?  ?Problem: Coping: ?Goal: Level of anxiety will decrease ?Outcome: Progressing ?  ?Problem: Elimination: ?Goal: Will not experience complications related to urinary retention ?Outcome: Progressing ?  ?

## 2021-04-23 DIAGNOSIS — E785 Hyperlipidemia, unspecified: Secondary | ICD-10-CM | POA: Diagnosis not present

## 2021-04-23 DIAGNOSIS — I5043 Acute on chronic combined systolic (congestive) and diastolic (congestive) heart failure: Secondary | ICD-10-CM | POA: Diagnosis not present

## 2021-04-23 DIAGNOSIS — E1169 Type 2 diabetes mellitus with other specified complication: Secondary | ICD-10-CM | POA: Diagnosis not present

## 2021-04-23 DIAGNOSIS — I5023 Acute on chronic systolic (congestive) heart failure: Secondary | ICD-10-CM | POA: Diagnosis not present

## 2021-04-23 DIAGNOSIS — I10 Essential (primary) hypertension: Secondary | ICD-10-CM | POA: Diagnosis not present

## 2021-04-23 LAB — GLUCOSE, CAPILLARY
Glucose-Capillary: 187 mg/dL — ABNORMAL HIGH (ref 70–99)
Glucose-Capillary: 194 mg/dL — ABNORMAL HIGH (ref 70–99)
Glucose-Capillary: 267 mg/dL — ABNORMAL HIGH (ref 70–99)
Glucose-Capillary: 127 mg/dL — ABNORMAL HIGH (ref 70–99)

## 2021-04-23 LAB — BASIC METABOLIC PANEL
Anion gap: 6 (ref 5–15)
BUN: 14 mg/dL (ref 6–20)
CO2: 28 mmol/L (ref 22–32)
Calcium: 8 mg/dL — ABNORMAL LOW (ref 8.9–10.3)
Chloride: 104 mmol/L (ref 98–111)
Creatinine, Ser: 1.5 mg/dL — ABNORMAL HIGH (ref 0.61–1.24)
GFR, Estimated: 54 mL/min — ABNORMAL LOW (ref >60)
Glucose, Bld: 184 mg/dL — ABNORMAL HIGH (ref 70–99)
Potassium: 4.6 mmol/L (ref 3.5–5.1)
Sodium: 138 mmol/L (ref 135–145)

## 2021-04-23 LAB — COOXEMETRY PANEL
Carboxyhemoglobin: 1.3 % (ref 0.5–1.5)
Methemoglobin: 0.9 % (ref 0.0–1.5)
O2 Saturation: 76.7 %
Total hemoglobin: 13.2 g/dL (ref 12.0–16.0)

## 2021-04-23 LAB — MAGNESIUM: Magnesium: 2.1 mg/dL (ref 1.7–2.4)

## 2021-04-23 MED ORDER — SENNOSIDES-DOCUSATE SODIUM 8.6-50 MG PO TABS
1.0000 | ORAL_TABLET | Freq: Every evening | ORAL | Status: DC | PRN
  Administered 2021-04-23: 1 via ORAL
  Filled 2021-04-23: qty 1

## 2021-04-23 NOTE — Progress Notes (Addendum)
Advanced Heart Failure Rounding Note  PCP-Cardiologist: Jenkins Rouge, MD   Subjective:    cMRI - LVEF 24% RVEF 21% LGE and ECW suggestive of cardiac amyloidosis--> PYP ordered . Myeloma panel ordered.   Feels ok. Left heel sore. Denies SOB.   Objective:   Weight Range: 62.8 kg Body mass index is 20.44 kg/m.   Vital Signs:   Temp:  [97.9 F (36.6 C)-99.7 F (37.6 C)] 99.7 F (37.6 C) (12/01 0431) Pulse Rate:  [79-87] 82 (12/01 0431) Resp:  [14-21] 21 (12/01 0431) BP: (91-137)/(71-95) 121/84 (12/01 0431) SpO2:  [97 %-100 %] 100 % (12/01 0431) Weight:  [62.8 kg] 62.8 kg (12/01 0431) Last BM Date: 04/21/21  Weight change: Filed Weights   04/21/21 0400 04/22/21 0600 04/23/21 0431  Weight: 67.1 kg 65.3 kg 62.8 kg    Intake/Output:   Intake/Output Summary (Last 24 hours) at 04/23/2021 0711 Last data filed at 04/23/2021 0005 Gross per 24 hour  Intake 480 ml  Output 1200 ml  Net -720 ml      Physical Exam   CVP 5 personally checked.  General: In bed.  No resp difficulty HEENT: normal Neck: supple. no JVD. Carotids 2+ bilat; no bruits. No lymphadenopathy or thryomegaly appreciated. Cor: PMI nondisplaced. Regular rate & rhythm. No rubs, gallops or murmurs. Lungs: clear Abdomen: soft, nontender, nondistended. No hepatosplenomegaly. No bruits or masses. Good bowel sounds. Extremities: no cyanosis, clubbing, rash, edema. LLE dressing. RUE PICC  Neuro: alert & orientedx3, cranial nerves grossly intact. moves all 4 extremities w/o difficulty. Affect pleasant    Telemetry   SR occasional PVCs.   Labs    CBC No results for input(s): WBC, NEUTROABS, HGB, HCT, MCV, PLT in the last 72 hours.  Basic Metabolic Panel Recent Labs    04/20/21 0722 04/21/21 0335 04/22/21 0300  NA 138 140 137  K 4.5 4.3 4.1  CL 102 102 100  CO2 28 31 32  GLUCOSE 200* 185* 186*  BUN _0 CREATININE 1.43* 1.22 1.50*  CALCIUM 8.2* 8.2* 8.1*  MG 1.8 1.7  --    Liver  Function Tests No results for input(s): AST, ALT, ALKPHOS, BILITOT, PROT, ALBUMIN in the last 72 hours.  No results for input(s): LIPASE, AMYLASE in the last 72 hours. Cardiac Enzymes No results for input(s): CKTOTAL, CKMB, CKMBINDEX, TROPONINI in the last 72 hours.  BNP: BNP (last 3 results) Recent Labs    03/08/21 0258 03/09/21 0304 04/19/21 2359  BNP 3,620.7* 2,895.4* 3,306.4*    ProBNP (last 3 results) Recent Labs    02/03/21 0911  PROBNP 3,012.0*     D-Dimer No results for input(s): DDIMER in the last 72 hours. Hemoglobin A1C Recent Labs    04/20/21 1100  HGBA1C 11.7*   Fasting Lipid Panel No results for input(s): CHOL, HDL, LDLCALC, TRIG, CHOLHDL, LDLDIRECT in the last 72 hours. Thyroid Function Tests Recent Labs    04/21/21 0335  TSH 0.782    Other results:   Imaging    MR CARDIAC MORPHOLOGY W WO CONTRAST  Result Date: 04/22/2021 CLINICAL DATA:  Cardiomyopathy of uncertain etiology EXAM: CARDIAC MRI TECHNIQUE: The patient was scanned on a 1.5 Tesla GE magnet. A dedicated cardiac coil was used. Functional imaging was done using Fiesta sequences. 2,3, and 4 chamber views were done to assess for RWMA's. Modified Simpson's rule using a short axis stack was used to calculate an ejection fraction on a dedicated work Conservation officer, nature. The patient  received 7 cc of Gadavist. After 10 minutes inversion recovery sequences were used to assess for infiltration and scar tissue. CONTRAST:  Gadavist 7 cc FINDINGS: Limited images of the lung fields showed a small right pleural effusion. Normal left ventricular size with mild LV hypertrophy. Global hypokinesis with EF 24%. Mildly dilated right ventricle with EF 21%. Mild biatrial enlargement. Minimal mitral regurgitation visually. Trileaflet aortic valve with no significant regurgitation or stenosis. Delayed enhancement imaging: The myocardium was difficult to null. There appears to be diffuse mid-wall late  gadolinium enhancement (LGE), particularly prominent in the septum and the lateral wall. There also may be some involvement of the RV free wall. MEASUREMENTS: MEASUREMENTS LVEDV 225 mL LVSV 54 mL LVEF 24% RVEDV 220 mL RVSV 47 mL RVEF 21% T2 49 septum, not significantly elevated. T1 1099, ECV 42% IMPRESSION: 1. Normal LV size with mild LV hypertrophy. EF 24%, diffuse hypokinesis. 2.  Mildly dilated RV with EF 21%. 3. On delayed enhancement imaging, the LV myocardium was difficult to null. There was diffuse mid-wall LGE particularly prominent in the septum and in the lateral wall. Also, suspect some involvement in the RV free wall. Coupled with extracellular volume percentage 42%, would consider the possibility of cardiac amyloidosis. Cannot rule out a diffuse process such as myocarditis though T2 not elevated. Dalton Mclean Electronically Signed   By: Loralie Champagne M.D.   On: 04/22/2021 14:13     Medications:     Scheduled Medications:  aspirin EC  81 mg Oral Daily   atorvastatin  80 mg Oral QPM   carvedilol  6.25 mg Oral BID   Chlorhexidine Gluconate Cloth  6 each Topical Daily   clopidogrel  75 mg Oral Daily   enoxaparin (LOVENOX) injection  40 mg Subcutaneous Q24H   hydrALAZINE  50 mg Oral Q8H   insulin aspart  0-5 Units Subcutaneous QHS   insulin aspart  0-9 Units Subcutaneous TID WC   isosorbide mononitrate  30 mg Oral Daily   sacubitril-valsartan  1 tablet Oral BID   sodium chloride flush  10-40 mL Intracatheter Q12H   spironolactone  12.5 mg Oral Daily    Infusions:   PRN Medications: acetaminophen **OR** acetaminophen, hydrALAZINE, sodium chloride flush   Assessment/Plan   Acute on chronic systolic HF/cardiomyopathy: -Onset not certain. He reports cardiomyopathy diagnosed just prior to MI while living in [place] a couple of years ago. -Echo 10/22 with EF 20-25%, RV okay -R/LHC 10/22: Patent RPLV stent with nonobstructive disease elsewhere, Preserved CO/CI, RA mean 5 mmHg, PCWP  mean 5 mmHg - Etiology not certain. ? If due to frequent PVCs and/or uncontrolled hypertension. Denies hx drug abuse. Reports alcohol use in past, but none last few years.  - Now readmitted with a/c HF. He is adamant he has been adherent with medications and reports his sister assists him with organizing them.  - NYHA 3b. Labs pending. Just drawn.  - CVP 5.  Does not need lasix. - Continue Entresto 24-26 mg bid - Remain off SGLT2i d/t A1c 12 - Continue Carvedilol 6.25 mg bid  - C/w Hydral/Imdur  - Spiro 12.5 mg daily 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 Myeloma panel ordered. PYP ordered.  -   2. PVCs: -Occasional PVCs.  -Keep K > 4, Mag > 2   3. Chest tightness: - Suspect demand ischemia with volume overload.  - HS troponin negative X 2 - Recent cath with patent stent  and no new obstructive disease.    4. CAD: - Prior MI followed by PCI in [place] in either 2019 or 2020.  - Patent RPLV stent on [facility] 10/22. Also had 20% distal RCA, 30% p LAD, 70% d LAD, 60% OM2 - No chest pain.  - On aspirin, plavix - Continue atorvastatin 80 mg daily   5. HTN: - Stable.  - Noncompliance a concern but reports taking all of his medicines. Sister arranges medicines per his report but does not know any of the names  6. Uncontrolled DM: - per TRH - would avoid SGLT2i for now as a1c is 12    7. Elevated alk phos -204 on 11/27, AST/ALT normal -[place] with RUQ ascites, otherwise negative   8. Left heel pressure ulcer, DTI - Continue to offload when in bed.   9. Hypomagnesemia  Mag pending.  Labs pending. Just drawn. Myeloma panel, CO-OX, BMET, Mag -   He does not want HH or Paramedicine.    Length of Stay: 3  Amy Clegg, NP  04/23/2021, 7:11 AM  Advanced Heart Failure Team Pager [phone] (M-F; 7a - 5p)  Please contact [facility] for night-coverage after hours (5p -7a ) and weekends on [email or web address]  Patient seen and examined with the  above-signed Advanced Practice Provider and/or Housestaff. I personally reviewed laboratory data, imaging studies and relevant notes. I independently examined the patient and formulated the important aspects of the plan. I have edited the note to reflect any of my changes or salient points. I have personally discussed the plan with the patient and/or family.  Denies CP, SOB, orthopnea or PND. PYP and myeloma serologies pending.   Ankle is sore. Says he is cold.   General:  Lying flat in bed  No resp difficulty HEENT: normal Neck: supple. JVP 5-6 Carotids 2+ bilat; no bruits. No lymphadenopathy or thryomegaly appreciated. Cor: PMI nondisplaced. Regular rate & rhythm. No rubs, gallops or murmurs. Lungs: clear Abdomen: soft, nontender, nondistended. No hepatosplenomegaly. No bruits or masses. Good bowel sounds. Extremities: no cyanosis, clubbing, rash, edema R ankle wrapped. L foot in boot  Neuro: alert & orientedx3, cranial nerves grossly intact. moves all 4 extremities w/o difficulty. Affect pleasant  Volume status looks ok. We reviewed cMRI at length. Discussed role of PYP and possibility of amyloid. He has poor insight into his HF. I also discussed potential benefit of Paramedicine program but he proceeded to name all the family members who are available to help him.   Continue GDMT. Hopefully we can get him home soon after PYP come 24 hour urine complete.   Total time spent >35 minutes. Over half that time spent discussing above.   Glori Bickers, MD  10:21 PM

## 2021-04-23 NOTE — Care Management Important Message (Signed)
Important Message  Patient Details  Name: Thomas West MRN: [id number] Date of Birth: 16-May-1961   Medicare Important Message Given:  Yes     Renie Ora 04/23/2021, 8:40 AM

## 2021-04-23 NOTE — Progress Notes (Signed)
PROGRESS NOTE        PATIENT DETAILS Name: Thomas West Age: 60 y.o. Sex: male Date of Birth: Jul 18, 1960 Admit Date: 04/19/2021 Admitting Physician Evalee Mutton Kristeen Mans, MD [id number], Malka So, MD  Brief Narrative: Patient is a 60 y.o. male with history of HFrEF, DM-2, HTN, HLD, CKD stage IIIa-noncompliant with follow-up and medications-presenting with at least 1 week history of exertional dyspnea, lower extremity edema-found to have decompensated systolic heart failure.  Subjective: Lying flat in bed.  Adamant that he does not want home health paramedicine services.  Claims that he was taking "all" of his medications before he presented to the hospital.  Objective: Vitals: Blood pressure 134/86, pulse 80, temperature 98.1 F (36.7 C), temperature source Oral, resp. rate 18, height 5\' 9"  (1.753 m), weight 62.8 kg, SpO2 100 %.   Exam: Gen Exam:Alert awake-not in any distress HEENT:atraumatic, normocephalic Chest: B/L clear to auscultation anteriorly CVS:S1S2 regular Abdomen:soft non tender, non distended Extremities:trace edema Neurology: Non focal Skin: no rash   Pertinent Labs/Radiology: Recent Labs  Lab 04/19/21 2359 04/20/21 0722 04/23/21 0715  WBC 6.2  --   --   HGB 13.5  --   --   PLT 326  --   --   NA 138   < > 138  K 4.1   < > 4.6  CREATININE 1.50*   < > 1.50*  AST 23  --   --   ALT 16  --   --   ALKPHOS 204*  --   --   BILITOT 1.6*  --   --    < > = values in this interval not displayed.      Assessment/Plan: HFrEF with exacerbation: Due to noncompliance to medications and to follow-up.  Volume status is markedly improved-remains on Lasix/Coreg/Imdur/hydralazine and Entresto.  Cardiac MRI suggestive of amyloidosis-myeloma panel/PYP ordered-urine collection in progress.    DM-2 (A1c 11.7 on 11/28): CBGs relatively stable with SSI-has refused insulin numerous times in the hospital.  Very poor insight to his underlying issues-do not  think he is a good candidate for outpatient insulin therapy-risk of hypoglycemia would be very high in my opinion.  Continue SSI for now-when closer to discharge-we will place on oral hypoglycemic agents.  Recent Labs    04/22/21 2133 04/23/21 0625 04/23/21 1142  GLUCAP 215* 187* 194*      HTN: BP stable-continue Coreg/Imdur/hydralazine/Entresto   History of CVA: Continue antiplatelet/statin  Nonobstructive CAD: Continue antiplatelets.  Underwent LHC during his most recent hospitalization  CKD stage IIIa: Creatinine close to baseline-watch closely  HLD: Continue Lipitor  Noncompliance to medication/follow-up: Counseled extensively  BMI Estimated body mass index is 20.44 kg/m as calculated from the following:   Height as of this encounter: 5\' 9"  (1.753 m).   Weight as of this encounter: 62.8 kg.       Procedures: None Consults: Cardioogy DVT Prophylaxis: Lovenox Code Status:Full code  Family Communication: None at bedside  Time spent: 25 minutes-Greater than 50% of this time was spent in counseling, explanation of diagnosis, planning of further management, and coordination of care.   Disposition Plan: Status is: Observation  The patient will require care spanning > 2 midnights and should be moved to inpatient because: Grossly volume overloaded-needs IV diuretics to achieve euvolemia.   Diet: Diet Order  Diet heart healthy/carb modified Room service appropriate? Yes; Fluid consistency: Thin; Fluid restriction: 1200 mL Fluid  Diet effective now                     Antimicrobial agents: Anti-infectives (From admission, onward)    None        MEDICATIONS: Scheduled Meds:  aspirin EC  81 mg Oral Daily   atorvastatin  80 mg Oral QPM   carvedilol  6.25 mg Oral BID   Chlorhexidine Gluconate Cloth  6 each Topical Daily   clopidogrel  75 mg Oral Daily   enoxaparin (LOVENOX) injection  40 mg Subcutaneous Q24H   hydrALAZINE  50 mg Oral Q8H    insulin aspart  0-5 Units Subcutaneous QHS   insulin aspart  0-9 Units Subcutaneous TID WC   isosorbide mononitrate  30 mg Oral Daily   sacubitril-valsartan  1 tablet Oral BID   sodium chloride flush  10-40 mL Intracatheter Q12H   spironolactone  12.5 mg Oral Daily   Continuous Infusions:  PRN Meds:.acetaminophen **OR** acetaminophen, hydrALAZINE, sodium chloride flush   I have personally reviewed following labs and imaging studies  LABORATORY DATA: CBC: Recent Labs  Lab 04/19/21 2359  WBC 6.2  NEUTROABS 3.5  HGB 13.5  HCT 41.4  MCV 84.1  PLT 326     Basic Metabolic Panel: Recent Labs  Lab 04/19/21 2359 04/20/21 0722 04/21/21 0335 04/22/21 0300 04/23/21 0715  NA 138 138 140 137 138  K 4.1 4.5 4.3 4.1 4.6  CL 102 102 102 100 104  CO2 28 28 31  32 28  GLUCOSE 182* 200* 185* 186* 184*  BUN 19 18 16 16 14   CREATININE 1.50* 1.43* 1.22 1.50* 1.50*  CALCIUM 8.2* 8.2* 8.2* 8.1* 8.0*  MG  --  1.8 1.7  --  2.1     GFR: Estimated Creatinine Clearance: 47.7 mL/min (A) (by C-G formula based on SCr of 1.5 mg/dL (H)).  Liver Function Tests: Recent Labs  Lab 04/19/21 2359  AST 23  ALT 16  ALKPHOS 204*  BILITOT 1.6*  PROT 6.3*  ALBUMIN 2.7*    No results for input(s): LIPASE, AMYLASE in the last 168 hours. No results for input(s): AMMONIA in the last 168 hours.  Coagulation Profile: No results for input(s): INR, PROTIME in the last 168 hours.  Cardiac Enzymes: No results for input(s): CKTOTAL, CKMB, CKMBINDEX, TROPONINI in the last 168 hours.  BNP (last 3 results) Recent Labs    02/03/21 0911  PROBNP 3,012.0*     Lipid Profile: No results for input(s): CHOL, HDL, LDLCALC, TRIG, CHOLHDL, LDLDIRECT in the last 72 hours.  Thyroid Function Tests: Recent Labs    04/21/21 0335  TSH 0.782     Anemia Panel: No results for input(s): VITAMINB12, FOLATE, FERRITIN, TIBC, IRON, RETICCTPCT in the last 72 hours.  Urine analysis:    Component Value  Date/Time   COLORURINE YELLOW 03/08/2021 0246   APPEARANCEUR HAZY (A) 03/08/2021 0246   LABSPEC 1.019 03/08/2021 0246   PHURINE 5.0 03/08/2021 0246   GLUCOSEU 150 (A) 03/08/2021 0246   HGBUR NEGATIVE 03/08/2021 0246   BILIRUBINUR NEGATIVE 03/08/2021 0246   BILIRUBINUR negative 01/02/2021 1312   KETONESUR NEGATIVE 03/08/2021 0246   PROTEINUR >=300 (A) 03/08/2021 0246   UROBILINOGEN 0.2 01/02/2021 1312   NITRITE NEGATIVE 03/08/2021 0246   LEUKOCYTESUR NEGATIVE 03/08/2021 0246    Sepsis Labs: Lactic Acid, Venous    Component Value Date/Time   LATICACIDVEN 1.5 04/20/2021 1736  MICROBIOLOGY: Recent Results (from the past 240 hour(s))  Resp Panel by RT-PCR (Flu A&B, Covid) Nasopharyngeal Swab     Status: None   Collection Time: 04/20/21  5:02 AM   Specimen: Nasopharyngeal Swab; Nasopharyngeal(NP) swabs in vial transport medium  Result Value Ref Range Status   SARS Coronavirus 2 by RT PCR NEGATIVE NEGATIVE Final    Comment: (NOTE) SARS-CoV-2 target nucleic acids are NOT DETECTED.  The SARS-CoV-2 RNA is generally detectable in upper respiratory specimens during the acute phase of infection. The lowest concentration of SARS-CoV-2 viral copies this assay can detect is 138 copies/mL. A negative result does not preclude SARS-Cov-2 infection and should not be used as the sole basis for treatment or other patient management decisions. A negative result may occur with  improper specimen collection/handling, submission of specimen other than nasopharyngeal swab, presence of viral mutation(s) within the areas targeted by this assay, and inadequate number of viral copies(<138 copies/mL). A negative result must be combined with clinical observations, patient history, and epidemiological information. The expected result is Negative.  Fact Sheet for Patients:  BloggerCourse.com  Fact Sheet for Healthcare Providers:   SeriousBroker.it  This test is no t yet approved or cleared by the [place] FDA and  has been authorized for detection and/or diagnosis of SARS-CoV-2 by FDA under an Emergency Use Authorization (EUA). This EUA will remain  in effect (meaning this test can be used) for the duration of the COVID-19 declaration under Section 564(b)(1) of the Act, 21 U.S.C.section 360bbb-3(b)(1), unless the authorization is terminated  or revoked sooner.       Influenza A by PCR NEGATIVE NEGATIVE Final   Influenza B by PCR NEGATIVE NEGATIVE Final    Comment: (NOTE) The Xpert Xpress SARS-CoV-2/FLU/RSV plus assay is intended as an aid in the diagnosis of influenza from Nasopharyngeal swab specimens and should not be used as a sole basis for treatment. Nasal washings and aspirates are unacceptable for Xpert Xpress SARS-CoV-2/FLU/RSV testing.  Fact Sheet for Patients: BloggerCourse.com  Fact Sheet for Healthcare Providers: SeriousBroker.it  This test is not yet approved or cleared by the [place] FDA and has been authorized for detection and/or diagnosis of SARS-CoV-2 by FDA under an Emergency Use Authorization (EUA). This EUA will remain in effect (meaning this test can be used) for the duration of the COVID-19 declaration under Section 564(b)(1) of the Act, 21 U.S.C. section 360bbb-3(b)(1), unless the authorization is terminated or revoked.  Performed at [facility], [address], [place], [place] [address]     RADIOLOGY STUDIES/RESULTS: MR CARDIAC MORPHOLOGY W WO CONTRAST  Result Date: 04/22/2021 CLINICAL DATA:  Cardiomyopathy of uncertain etiology EXAM: CARDIAC MRI TECHNIQUE: The patient was scanned on a 1.5 Tesla GE magnet. A dedicated cardiac coil was used. Functional imaging was done using Fiesta sequences. 2,3, and 4 chamber views were done to assess for RWMA's. Modified Simpson's rule using a  short axis stack was used to calculate an ejection fraction on a dedicated work Research officer, trade union. The patient received 7 cc of Gadavist. After 10 minutes inversion recovery sequences were used to assess for infiltration and scar tissue. CONTRAST:  Gadavist 7 cc FINDINGS: Limited images of the lung fields showed a small right pleural effusion. Normal left ventricular size with mild LV hypertrophy. Global hypokinesis with EF 24%. Mildly dilated right ventricle with EF 21%. Mild biatrial enlargement. Minimal mitral regurgitation visually. Trileaflet aortic valve with no significant regurgitation or stenosis. Delayed enhancement imaging: The myocardium was  difficult to null. There appears to be diffuse mid-wall late gadolinium enhancement (LGE), particularly prominent in the septum and the lateral wall. There also may be some involvement of the RV free wall. MEASUREMENTS: MEASUREMENTS LVEDV 225 mL LVSV 54 mL LVEF 24% RVEDV 220 mL RVSV 47 mL RVEF 21% T2 49 septum, not significantly elevated. T1 1099, ECV 42% IMPRESSION: 1. Normal LV size with mild LV hypertrophy. EF 24%, diffuse hypokinesis. 2.  Mildly dilated RV with EF 21%. 3. On delayed enhancement imaging, the LV myocardium was difficult to null. There was diffuse mid-wall LGE particularly prominent in the septum and in the lateral wall. Also, suspect some involvement in the RV free wall. Coupled with extracellular volume percentage 42%, would consider the possibility of cardiac amyloidosis. Cannot rule out a diffuse process such as myocarditis though T2 not elevated. Dalton Mclean Electronically Signed   By: Loralie Champagne M.D.   On: 04/22/2021 14:13     LOS: 3 days   Oren Binet, MD  [facility]    To contact the attending provider between 7A-7P or the covering provider during after hours 7P-7A, please log into the web site [email or web address] and access using universal [place] password for that web site. If you do not have the  password, please call the hospital operator.  04/23/2021, 1:03 PM

## 2021-04-23 NOTE — Progress Notes (Signed)
Mobility Specialist Progress Note:   04/23/21 1459  Mobility  Activity Ambulated to bathroom  Level of Assistance Contact guard assist, steadying assist  Assistive Device Front wheel walker  Distance Ambulated (ft) 30 ft  Mobility Out of bed for toileting  Mobility Response Tolerated well  Mobility performed by Mobility specialist  $Mobility charge 1 Mobility    [facility] Primary Phone [phone] Secondary Phone [phone]

## 2021-04-24 ENCOUNTER — Inpatient Hospital Stay (HOSPITAL_COMMUNITY): Payer: Medicare Other

## 2021-04-24 ENCOUNTER — Other Ambulatory Visit (HOSPITAL_COMMUNITY): Payer: Self-pay

## 2021-04-24 DIAGNOSIS — I5043 Acute on chronic combined systolic (congestive) and diastolic (congestive) heart failure: Secondary | ICD-10-CM | POA: Diagnosis not present

## 2021-04-24 DIAGNOSIS — I5023 Acute on chronic systolic (congestive) heart failure: Secondary | ICD-10-CM | POA: Diagnosis not present

## 2021-04-24 DIAGNOSIS — I10 Essential (primary) hypertension: Secondary | ICD-10-CM | POA: Diagnosis not present

## 2021-04-24 DIAGNOSIS — E785 Hyperlipidemia, unspecified: Secondary | ICD-10-CM | POA: Diagnosis not present

## 2021-04-24 DIAGNOSIS — E1169 Type 2 diabetes mellitus with other specified complication: Secondary | ICD-10-CM | POA: Diagnosis not present

## 2021-04-24 LAB — BASIC METABOLIC PANEL
Anion gap: 5 (ref 5–15)
BUN: 16 mg/dL (ref 6–20)
CO2: 26 mmol/L (ref 22–32)
Calcium: 8.2 mg/dL — ABNORMAL LOW (ref 8.9–10.3)
Chloride: 108 mmol/L (ref 98–111)
Creatinine, Ser: 1.42 mg/dL — ABNORMAL HIGH (ref 0.61–1.24)
GFR, Estimated: 57 mL/min — ABNORMAL LOW (ref >60)
Glucose, Bld: 138 mg/dL — ABNORMAL HIGH (ref 70–99)
Potassium: 4.6 mmol/L (ref 3.5–5.1)
Sodium: 139 mmol/L (ref 135–145)

## 2021-04-24 LAB — GLUCOSE, CAPILLARY
Glucose-Capillary: 210 mg/dL — ABNORMAL HIGH (ref 70–99)
Glucose-Capillary: 153 mg/dL — ABNORMAL HIGH (ref 70–99)
Glucose-Capillary: 206 mg/dL — ABNORMAL HIGH (ref 70–99)
Glucose-Capillary: 213 mg/dL — ABNORMAL HIGH (ref 70–99)
Glucose-Capillary: 213 mg/dL — ABNORMAL HIGH (ref 70–99)

## 2021-04-24 LAB — COOXEMETRY PANEL
Carboxyhemoglobin: 1.4 % (ref 0.5–1.5)
Methemoglobin: 1 % (ref 0.0–1.5)
O2 Saturation: 78.6 %
Total hemoglobin: 13.3 g/dL (ref 12.0–16.0)

## 2021-04-24 MED ORDER — SITAGLIPTIN PHOSPHATE 25 MG PO TABS
25.0000 mg | ORAL_TABLET | Freq: Every day | ORAL | 2 refills | Status: DC
  Filled 2021-04-24: qty 30, 30d supply, fill #0

## 2021-04-24 MED ORDER — SACUBITRIL-VALSARTAN 24-26 MG PO TABS
1.0000 | ORAL_TABLET | Freq: Two times a day (BID) | ORAL | 2 refills | Status: DC
Start: 2021-04-24 — End: 2021-07-15
  Filled 2021-04-24: qty 60, 30d supply, fill #0

## 2021-04-24 MED ORDER — HYDRALAZINE HCL 50 MG PO TABS
50.0000 mg | ORAL_TABLET | Freq: Three times a day (TID) | ORAL | 2 refills | Status: DC
  Filled 2021-04-24: qty 90, 30d supply, fill #0

## 2021-04-24 MED ORDER — ATORVASTATIN CALCIUM 80 MG PO TABS
80.0000 mg | ORAL_TABLET | Freq: Every day | ORAL | 2 refills | Status: DC
  Filled 2021-04-24: qty 30, 30d supply, fill #0

## 2021-04-24 MED ORDER — ASPIRIN 81 MG PO TBEC
81.0000 mg | DELAYED_RELEASE_TABLET | Freq: Every day | ORAL | 2 refills | Status: DC
Start: 2021-04-24 — End: 2021-07-15
  Filled 2021-04-24: qty 30, 30d supply, fill #0

## 2021-04-24 MED ORDER — CARVEDILOL 6.25 MG PO TABS
6.2500 mg | ORAL_TABLET | Freq: Two times a day (BID) | ORAL | 2 refills | Status: DC
  Filled 2021-04-24: qty 60, 30d supply, fill #0

## 2021-04-24 MED ORDER — METFORMIN HCL 500 MG PO TABS
500.0000 mg | ORAL_TABLET | Freq: Two times a day (BID) | ORAL | 2 refills | Status: DC
  Filled 2021-04-24: qty 60, 30d supply, fill #0

## 2021-04-24 MED ORDER — CLOPIDOGREL BISULFATE 75 MG PO TABS
75.0000 mg | ORAL_TABLET | Freq: Every day | ORAL | 2 refills | Status: DC
  Filled 2021-04-24: qty 30, 30d supply, fill #0

## 2021-04-24 MED ORDER — TECHNETIUM TC 99M PYROPHOSPHATE
19.4500 | Freq: Once | INTRAVENOUS | Status: AC | PRN
  Administered 2021-04-24: 19.45 via INTRAVENOUS
  Filled 2021-04-24: qty 20

## 2021-04-24 MED ORDER — POLYETHYLENE GLYCOL 3350 17 GM/SCOOP PO POWD
17.0000 g | Freq: Every day | ORAL | 0 refills | Status: DC
  Filled 2021-04-24: qty 238, 14d supply, fill #0

## 2021-04-24 MED ORDER — SPIRONOLACTONE 25 MG PO TABS
25.0000 mg | ORAL_TABLET | Freq: Every day | ORAL | Status: DC
  Administered 2021-04-25 – 2021-04-26 (×2): 25 mg via ORAL
  Filled 2021-04-24 (×2): qty 1

## 2021-04-24 MED ORDER — SPIRONOLACTONE 25 MG PO TABS
25.0000 mg | ORAL_TABLET | Freq: Every day | ORAL | 2 refills | Status: DC
  Filled 2021-04-24: qty 30, 30d supply, fill #0

## 2021-04-24 MED ORDER — ISOSORBIDE MONONITRATE ER 30 MG PO TB24
30.0000 mg | ORAL_TABLET | Freq: Every day | ORAL | 2 refills | Status: DC
  Filled 2021-04-24: qty 30, 30d supply, fill #0

## 2021-04-24 NOTE — Progress Notes (Signed)
Physical Therapy Treatment Patient Details Name: Thomas West MRN: [id number] DOB: 05-07-1961 Today's Date: 04/24/2021   History of Present Illness The pt is a 60 yo male presenting 11/28 with SOB, LE edema, and chest tightness. Upon work-up, pt found to have acute CHF exacerbation.  Of note, pt with admission ~1 month ago for similar sx. PMH includes: CHF, CAD, DM II, MI, HTN, stroke, peripherla neuropathy, CKD II, and HLD.    PT Comments    Focused session on stair training, with pt displaying an ability to navigate a flight of stairs with simulated set-up and technique to home with minA. However, pt still displays difficulty accepting weight on his L foot due to pain, impacting his activity tolerance and balance. Pt insistent on keeping soft prevalon boot donned on L foot when standing and ambulating to relieve his pain, verbalizing understanding of PT education that it is a slick surface and places him at risk for falls. Will continue to follow acutely. Current recommendations remain appropriate.   Recommendations for follow up therapy are one component of a multi-disciplinary discharge planning process, led by the attending physician.  Recommendations may be updated based on patient status, additional functional criteria and insurance authorization.  Follow Up Recommendations  Home health PT (vs no PT pending progression)     Assistance Recommended at Discharge Intermittent Supervision/Assistance  Equipment Recommendations  None recommended by PT    Recommendations for Other Services       Precautions / Restrictions Precautions Precautions: Fall Required Braces or Orthoses: Other Brace Other Brace: prevalon boot to LLE (pt insisting on walking with it on, offered to get pt hard one for improved grip on plantar surface but pt declined it, pt verbalized that walking with it on puts him at risk for falls) Restrictions Weight Bearing Restrictions: No     Mobility  Bed  Mobility Overal bed mobility: Modified Independent             General bed mobility comments: no assist needed to complete, but slowed movements    Transfers Overall transfer level: Needs assistance Equipment used: Rollator (4 wheels) Transfers: Sit to/from Stand;Bed to chair/wheelchair/BSC Sit to Stand: Min guard     Step pivot transfers: Min guard     General transfer comment: Cues for applying rollator brakes as pt initially squeezing them repeatedly. Cues for hand placement. Able to come to stand 1x from EOB and 2x from rollator without LOB, min guard for safety. Min guard for stand step bed <> rollator.    Ambulation/Gait Ambulation/Gait assistance: Min guard Gait Distance (Feet): 3 Feet (x2 bouts of ~3 ft each) Assistive device: Rollator (4 wheels) Gait Pattern/deviations: Step-to pattern;Antalgic;Knee flexed in stance - left;Decreased weight shift to left Gait velocity: decreased Gait velocity interpretation: <1.31 ft/sec, indicative of household ambulator   General Gait Details: Pt with slow, antalgic gait with decreased weight shift to L foot due to pain. Pt insisting on wearing prevalon boot when ambulating, verbalizing understanding of risk of falls. No LOB, min guard for stepping to transfer between surfaces.   Stairs Stairs: Yes Stairs assistance: Min assist Stair Management: One rail Right;One rail Left;Alternating pattern;Step to pattern;Forwards;Sideways Number of Stairs: 10 General stair comments: Ascends with R rail and pt's L arm acorss PT's shoulder to simulate technique and set-up at home, reciprocal pattern, no LOB, minA to steady and lift pt to advance to each step. Descends with L rail and pt's R arm across PT's shoulders to simulate technique and set-up at  home, initially with step-to pattern forwards but pt needing to turn slightly sideways due to difficulty advancing L foot and clearing it, minA to steady, no LOB.   Wheelchair Mobility     Modified Rankin (Stroke Patients Only)       Balance Overall balance assessment: Needs assistance Sitting-balance support: No upper extremity supported Sitting balance-Leahy Scale: Good     Standing balance support: Single extremity supported;During functional activity;Bilateral upper extremity supported Standing balance-Leahy Scale: Poor Standing balance comment: 1-2 UE support and min guard-minA depending on standing activity                            Cognition Arousal/Alertness: Awake/alert Behavior During Therapy: WFL for tasks assessed/performed Overall Cognitive Status: Within Functional Limits for tasks assessed                                          Exercises      General Comments        Pertinent Vitals/Pain Pain Assessment: Faces Faces Pain Scale: Hurts little more Pain Location: R arm, L heel Pain Descriptors / Indicators: Discomfort;Grimacing;Guarding Pain Intervention(s): Limited activity within patient's tolerance;Monitored during session;Repositioned    Home Living                          Prior Function            PT Goals (current goals can now be found in the care plan section) Acute Rehab PT Goals Patient Stated Goal: to go home PT Goal Formulation: With patient Time For Goal Achievement: 05/06/21 Potential to Achieve Goals: Good Progress towards PT goals: Progressing toward goals    Frequency    Min 3X/week      PT Plan Current plan remains appropriate    Co-evaluation              AM-PAC PT "6 Clicks" Mobility   Outcome Measure  Help needed turning from your back to your side while in a flat bed without using bedrails?: None Help needed moving from lying on your back to sitting on the side of a flat bed without using bedrails?: None Help needed moving to and from a bed to a chair (including a wheelchair)?: A Little Help needed standing up from a chair using your arms (e.g.,  wheelchair or bedside chair)?: A Little Help needed to walk in hospital room?: A Little Help needed climbing 3-5 steps with a railing? : A Little 6 Click Score: 20    End of Session   Activity Tolerance: Patient tolerated treatment well Patient left: with call bell/phone within reach;in bed;Other (comment) (with transport coming to get pt)   PT Visit Diagnosis: Other abnormalities of gait and mobility (R26.89);Muscle weakness (generalized) (M62.81);Pain;Unsteadiness on feet (R26.81);Difficulty in walking, not elsewhere classified (R26.2) Pain - Right/Left: Left Pain - part of body: Ankle and joints of foot     Time: 1031-1047 PT Time Calculation (min) (ACUTE ONLY): 16 min  Charges:  $Gait Training: 8-22 mins                     Raymond Gurney, PT, DPT Acute Rehabilitation Services  Pager: [phone] Office: [phone]    Thomas West 04/24/2021, 10:58 AM

## 2021-04-24 NOTE — Progress Notes (Addendum)
Advanced Heart Failure Rounding Note  PCP-Cardiologist: Jenkins Rouge, MD   Subjective:    cMRI - LVEF 24% RVEF 21% LGE and ECW suggestive of cardiac amyloidosis--> PYP ordered . Myeloma panel collected 04/23/21    Complaining of heel pain. Denies SOB.    Objective:   Weight Range: 65 kg Body mass index is 21.18 kg/m.   Vital Signs:   Temp:  [98.1 F (36.7 C)-99.5 F (37.5 C)] 98.1 F (36.7 C) (12/02 0822) Pulse Rate:  [55-87] 83 (12/02 0822) Resp:  [16-28] 20 (12/02 0822) BP: (115-137)/(62-90) 119/75 (12/02 0822) SpO2:  [98 %-100 %] 100 % (12/02 0822) Weight:  [65 kg] 65 kg (12/02 0400) Last BM Date: 04/21/21  Weight change: Filed Weights   04/22/21 0600 04/23/21 0431 04/24/21 0400  Weight: 65.3 kg 62.8 kg 65 kg    Intake/Output:   Intake/Output Summary (Last 24 hours) at 04/24/2021 0903 Last data filed at 04/24/2021 2119 Gross per 24 hour  Intake 958 ml  Output 1700 ml  Net -742 ml      Physical Exam  CVP 6 personally checked.   General:   No resp difficulty HEENT: normal Neck: supple. no JVD. Carotids 2+ bilat; no bruits. No lymphadenopathy or thryomegaly appreciated. Cor: PMI nondisplaced. Regular rate & rhythm. No rubs, gallops or murmurs. Lungs: clear Abdomen: soft, nontender, nondistended. No hepatosplenomegaly. No bruits or masses. Good bowel sounds. Extremities: no cyanosis, clubbing, rash, edema. RUE PICC  Neuro: alert & orientedx3, cranial nerves grossly intact. moves all 4 extremities w/o difficulty. Affect pleasant    Telemetry  SR 80s occasional PVCs. Personally reviewed.   Labs    CBC No results for input(s): WBC, NEUTROABS, HGB, HCT, MCV, PLT in the last 72 hours.  Basic Metabolic Panel Recent Labs    04/23/21 0715 04/24/21 0500  NA 138 139  K 4.6 4.6  CL 104 108  CO2 28 26  GLUCOSE 184* 138*  BUN 14 16  CREATININE 1.50* 1.42*  CALCIUM 8.0* 8.2*  MG 2.1  --    Liver Function Tests No results for input(s): AST, ALT,  ALKPHOS, BILITOT, PROT, ALBUMIN in the last 72 hours.  No results for input(s): LIPASE, AMYLASE in the last 72 hours. Cardiac Enzymes No results for input(s): CKTOTAL, CKMB, CKMBINDEX, TROPONINI in the last 72 hours.  BNP: BNP (last 3 results) Recent Labs    03/08/21 0258 03/09/21 0304 04/19/21 2359  BNP 3,620.7* 2,895.4* 3,306.4*    ProBNP (last 3 results) Recent Labs    02/03/21 0911  PROBNP 3,012.0*     D-Dimer No results for input(s): DDIMER in the last 72 hours. Hemoglobin A1C No results for input(s): HGBA1C in the last 72 hours.  Fasting Lipid Panel No results for input(s): CHOL, HDL, LDLCALC, TRIG, CHOLHDL, LDLDIRECT in the last 72 hours. Thyroid Function Tests No results for input(s): TSH, T4TOTAL, T3FREE, THYROIDAB in the last 72 hours.  Invalid input(s): FREET3   Other results:   Imaging    No results found.   Medications:     Scheduled Medications:  aspirin EC  81 mg Oral Daily   atorvastatin  80 mg Oral QPM   carvedilol  6.25 mg Oral BID   Chlorhexidine Gluconate Cloth  6 each Topical Daily   clopidogrel  75 mg Oral Daily   enoxaparin (LOVENOX) injection  40 mg Subcutaneous Q24H   hydrALAZINE  50 mg Oral Q8H   insulin aspart  0-5 Units Subcutaneous QHS   insulin aspart  0-9 Units Subcutaneous TID WC   isosorbide mononitrate  30 mg Oral Daily   sacubitril-valsartan  1 tablet Oral BID   sodium chloride flush  10-40 mL Intracatheter Q12H   spironolactone  12.5 mg Oral Daily    Infusions:   PRN Medications: acetaminophen **OR** acetaminophen, hydrALAZINE, senna-docusate, sodium chloride flush   Assessment/Plan   Acute on chronic systolic HF/cardiomyopathy: -Onset not certain. He reports cardiomyopathy diagnosed just prior to MI while living in [place] a couple of years ago. -Echo 10/22 with EF 20-25%, RV okay -R/LHC 10/22: Patent RPLV stent with nonobstructive disease elsewhere, Preserved CO/CI, RA mean 5 mmHg, PCWP mean 5 mmHg -  Etiology not certain. ? If due to frequent PVCs and/or uncontrolled hypertension. Denies hx drug abuse. Reports alcohol use in past, but none last few years.  - Now readmitted with a/c HF. He is adamant he has been adherent with medications and reports his sister assists him with organizing them.  - NYHA 3b.  - CO-OX remains stable. CVP 6 .  - Continue Entresto 24-26 mg bid - Remain off SGLT2i d/t A1c 12 - Continue Carvedilol 6.25 mg bid  - C/w Hydral/Imdur  - Increase spiro to 25 mg daily.  - Renal function stable.  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 Myeloma panel drawn 04/23/21  - PYP ordred.   -   2. PVCs: -Occasional PVCs.  -Keep K > 4, Mag > 2   3. Chest tightness: - Suspect demand ischemia with volume overload.  - HS troponin negative X 2 - Recent cath with patent stent and no new obstructive disease.    4. CAD: - Prior MI followed by PCI in [place] in either 2019 or 2020.  - Patent RPLV stent on [facility] 10/22. Also had 20% distal RCA, 30% p LAD, 70% d LAD, 60% OM2 - No chest pain.  - On aspirin, plavix - Continue atorvastatin 80 mg daily   5. HTN: - Stable.  - Noncompliance a concern but reports taking all of his medicines. Sister arranges medicines per his report but does not know any of the names  6. Uncontrolled DM: - per TRH - would avoid SGLT2i for now as a1c is 12    7. Elevated alk phos -204 on 11/27, AST/ALT normal -[place] with RUQ ascites, otherwise negative   8. Left heel pressure ulcer, DTI - Continue to offload when in bed.   9. Hypomagnesemia  Last mag 2.1 .  Myeloma panel drawn 04/23/21 . PY scan today.   He does not want HH or Paramedicine.   If he is discharged today he will need transportation and all medications.   HF F/U 04/30/21 at 9:30   Length of Stay: Benson, NP  04/24/2021, 9:03 AM  Advanced Heart Failure Team Pager [phone] (M-F; 7a - 5p)  Please contact [facility] for  night-coverage after hours (5p -7a ) and weekends on [email or web address]  Patient seen and examined with the above-signed Advanced Practice Provider and/or Housestaff. I personally reviewed laboratory data, imaging studies and relevant notes. I independently examined the patient and formulated the important aspects of the plan. I have edited the note to reflect any of my changes or salient points. I have personally discussed the plan with the patient and/or family.  He denies SOB, orthopnea or PND.   CVP and co-ox stable.   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  General:  Lying in bed No resp difficulty HEENT: normal Neck: supple. no JVD. Carotids 2+ bilat; no bruits. No lymphadenopathy or thryomegaly appreciated. Cor: PMI nondisplaced. Regular rate & rhythm. No rubs, gallops or murmurs. Lungs: clear Abdomen: soft, nontender, nondistended. No hepatosplenomegaly. No bruits or masses. Good bowel sounds. Extremities: no cyanosis, clubbing, rash, edema L heel boot Neuro: alert & orientedx3, cranial nerves grossly intact. moves all 4 extremities w/o difficulty. Affect pleasant  HF currently well compensated. 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 (Will also need genetic testing)  Ok for d/c from our perspective. Although A1c is markedly elevated he was tolerating Jardiance in past and I think ok to resume. He is not candidate for advanced HF therapies.   D/c HF meds  Jardiance 10 daily Entresto 24/26 bid Spiro 25 daily  Carvedilol 6.25 bid Hydral 50 tid  Imdur 30 daily  Atorva 80 daily ECASA 81 daily  Plavix 75 daily     We will s/o. Outpatient HF f/u will be arranged.   Dispo and meds discussed with him personally. Continues to refuse Paramedicine help.   Total time spent 35 minutes. Over half that time spent discussing above.    Glori Bickers, MD  7:54 PM

## 2021-04-24 NOTE — TOC Initial Note (Signed)
Transition of Care [facility]) - Initial/Assessment Note    Patient Details  Name: Thomas West MRN: [id number] Date of Birth: 1960/12/24  [facility]) CM/SW Contact:    Elliot Cousin, RN Phone Number: [phone] 04/24/2021, 5:40 PM  Clinical Narrative:                 HF TOC CM spoke to pt at bedside. States he lives with son. His sister helps put meds in pill planner. He has RW and bedside commode at home. Offered choice for [facility]. Pt agreeable to [facility]. Contacted [facility] rep, Pearson Grippe with new referral. States she will review. Orders in Epic.   Expected Discharge Plan: Home w Home Health Services Barriers to Discharge: Continued Medical Work up   Patient Goals and CMS Choice Patient states their goals for this hospitalization and ongoing recovery are:: feels he will do well at home CMS Medicare.gov Compare Post Acute Care list provided to:: Patient Choice offered to / list presented to : Patient  Expected Discharge Plan and Services Expected Discharge Plan: Home w Home Health Services In-house Referral: Clinical Social Work Discharge Planning Services: CM Consult Post Acute Care Choice: Home Health Living arrangements for the past 2 months: Apartment                           HH Arranged: Charity fundraiser, PT HH Agency: [facility] Date HH Agency Contacted: 04/24/21 Time HH Agency Contacted: [phone] Representative spoke with at [facility] Agency: Werner Lean  Prior Living Arrangements/Services Living arrangements for the past 2 months: Apartment Lives with:: Adult Children Patient language and need for interpreter reviewed:: Yes Do you feel safe going back to the place where you live?: Yes      Need for Family Participation in Patient Care: Yes (Comment) Care giver support system in place?: Yes (comment) Current home services: DME ([employer], Bedside commode) Criminal Activity/Legal Involvement Pertinent to Current Situation/Hospitalization:  No - Comment as needed  Activities of Daily Living      Permission Sought/Granted Permission sought to share information with : Case Manager, PCP, Family Supports Permission granted to share information with : Yes, Verbal Permission Granted  Share Information with NAME: Brandy Kabat  Permission granted to share info w AGENCY: Home Health  Permission granted to share info w Relationship: son  Permission granted to share info w Contact Information: [phone]  Emotional Assessment Appearance:: Appears stated age Attitude/Demeanor/Rapport: Gracious, Engaged Affect (typically observed): Accepting Orientation: : Oriented to Self, Oriented to Place, Oriented to  Time, Oriented to Situation   Psych Involvement: No (comment)  Admission diagnosis:  CHF (congestive heart failure) (HCC) [I50.9] Elevated LFTs [R79.89] CHF exacerbation (HCC) [I50.9] Patient Active Problem List   Diagnosis Date Noted   CHF exacerbation (HCC) 04/20/2021   Acute on chronic systolic heart failure (HCC) 03/09/2021   Acute on chronic HFrEF (heart failure with reduced ejection fraction) (HCC) 03/08/2021   HFrEF (heart failure with reduced ejection fraction) (HCC) 02/03/2021   Peripheral neuropathy 02/03/2021   Diabetes mellitus (HCC) 03/27/2019   Hyperlipidemia associated with type 2 diabetes mellitus (HCC) 03/27/2019   CAD (coronary artery disease) 03/05/2019   History of MI (myocardial infarction) 03/05/2019   Type 2 diabetes mellitus with diabetic neuropathy, unspecified (HCC) 03/05/2019   CKD (chronic kidney disease), stage II 03/05/2019   HLD (hyperlipidemia) 03/05/2019   GERD (gastroesophageal reflux disease) 03/05/2019   Hypertension, essential, benign 03/05/2019  PCP:  [name], [name], MD Pharmacy:   [facility] [address] [place] [address] Phone: [phone] Fax: [phone]  [facility] [address] [place] [address] Phone: [phone] Fax: [phone]     Social Determinants of Health (SDOH) Interventions    Readmission Risk Interventions No flowsheet data found.

## 2021-04-24 NOTE — Progress Notes (Signed)
Mobility Specialist Progress Note:   04/24/21 1524  Mobility  Activity Ambulated in room  Level of Assistance Modified independent, requires aide device or extra time  Assistive Device Front wheel walker  Distance Ambulated (ft) 16 ft  Mobility Ambulated with assistance in room  Mobility Response Tolerated well  Mobility performed by Mobility specialist  Bed Position Chair  $Mobility charge 1 Mobility   Pt received on BSC. Pt agreed to ambulate to chair. Pt said he already walked today and did not want to ambulate further. Pt left in chair with call bell in reach and all needs met.   [facility] Public librarian Phone [phone] Secondary Phone [phone]

## 2021-04-24 NOTE — Plan of Care (Signed)
  Problem: Health Behavior/Discharge Planning: Goal: Ability to manage health-related needs will improve Outcome: Progressing   Problem: Education: Goal: Knowledge of General Education information will improve Description: Including pain rating scale, medication(s)/side effects and non-pharmacologic comfort measures Outcome: Progressing   Problem: Clinical Measurements: Goal: Ability to maintain clinical measurements within normal limits will improve Outcome: Progressing   Problem: Clinical Measurements: Goal: Will remain free from infection Outcome: Progressing   Problem: Clinical Measurements: Goal: Diagnostic test results will improve Outcome: Progressing   

## 2021-04-24 NOTE — Progress Notes (Signed)
PROGRESS NOTE        PATIENT DETAILS Name: Thomas West Age: 60 y.o. Sex: male Date of Birth: October 27, 1960 Admit Date: 04/19/2021 Admitting Physician Evalee Mutton Kristeen Mans, MD PC:[id number], Malka So, MD  Brief Narrative: Patient is a 60 y.o. male with history of HFrEF, DM-2, HTN, HLD, CKD stage IIIa-noncompliant with follow-up and medications-presenting with at least 1 week history of exertional dyspnea, lower extremity edema-found to have decompensated systolic heart failure.  Subjective: Lying comfortably in bed-volume sinuses has markedly improved.  After extensive discussion-he is now agreeable to home paramedicine program for CHF.  Objective: Vitals: Blood pressure 119/75, pulse 83, temperature 98.1 F (36.7 C), temperature source Axillary, resp. rate 20, height 5\' 9"  (1.753 m), weight 65 kg, SpO2 100 %.   Exam: Gen Exam:Alert awake-not in any distress HEENT:atraumatic, normocephalic Chest: B/L clear to auscultation anteriorly CVS:S1S2 regular Abdomen:soft non tender, non distended Extremities:no edema Neurology: Non focal Skin: no rash    Pertinent Labs/Radiology: Recent Labs  Lab 04/19/21 2359 04/20/21 0722 04/24/21 0500  WBC 6.2  --   --   HGB 13.5  --   --   PLT 326  --   --   NA 138   < > 139  K 4.1   < > 4.6  CREATININE 1.50*   < > 1.42*  AST 23  --   --   ALT 16  --   --   ALKPHOS 204*  --   --   BILITOT 1.6*  --   --    < > = values in this interval not displayed.      Assessment/Plan: HFrEF with exacerbation: Due to noncompliance to medications and follow-up.  Volume status is markedly improved-he is close to becoming euvolemic.  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  CHF team following-myeloma panel sent on 12/1 (pending)-PYP-scan pending.  Continue Entresto, Coreg, hydralazine/, Imdur and Aldactone.  Suspect home on 12/3 once PYP scan is completed.  DM-2 (A1c 11.7 on 11/28): CBGs relatively stable with SSI-has refused  insulin numerous times in the hospital.  Very poor insight to his underlying issues-do not think he is a good candidate for outpatient insulin therapy-risk of hypoglycemia would be very high in my opinion.  Continue SSI for now-when closer to discharge-we will place on oral hypoglycemic agents.  Recent Labs    04/23/21 1602 04/23/21 2114 04/24/21 0613  GLUCAP 267* 127* 210*      HTN: BP stable-continue Coreg/Imdur/hydralazine/Entresto   History of CVA: Continue antiplatelet/statin  Nonobstructive CAD: Continue antiplatelets.  Underwent LHC during his most recent hospitalization  CKD stage IIIa: Creatinine close to baseline-watch closely  HLD: Continue Lipitor  Noncompliance to medication/follow-up: Counseled extensively-he initially was adamant about not wanting CHF better medicine program-however today he claims he has changed his mind and now consents to telemedicine program.  I have sent a epic secure chat to Amy Clegg-NP.  BMI Estimated body mass index is 21.18 kg/m as calculated from the following:   Height as of this encounter: 5\' 9"  (1.753 m).   Weight as of this encounter: 65 kg.   Left heel deep tissue pressure injury: Present prior to admission-continue care per wound care.  Procedures: None Consults: Cardioogy DVT Prophylaxis: Lovenox Code Status:Full code  Family Communication: None at bedside  Time spent: 25 minutes-Greater than 50% of this time was spent  in counseling, explanation of diagnosis, planning of further management, and coordination of care.   Disposition Plan: Status is: Observation  The patient will require care spanning > 2 midnights and should be moved to inpatient because: Grossly volume overloaded-needs IV diuretics to achieve euvolemia.   Diet: Diet Order             Diet heart healthy/carb modified Room service appropriate? Yes; Fluid consistency: Thin; Fluid restriction: 1200 mL Fluid  Diet effective now                      Antimicrobial agents: Anti-infectives (From admission, onward)    None        MEDICATIONS: Scheduled Meds:  aspirin EC  81 mg Oral Daily   atorvastatin  80 mg Oral QPM   carvedilol  6.25 mg Oral BID   Chlorhexidine Gluconate Cloth  6 each Topical Daily   clopidogrel  75 mg Oral Daily   enoxaparin (LOVENOX) injection  40 mg Subcutaneous Q24H   hydrALAZINE  50 mg Oral Q8H   insulin aspart  0-5 Units Subcutaneous QHS   insulin aspart  0-9 Units Subcutaneous TID WC   isosorbide mononitrate  30 mg Oral Daily   sacubitril-valsartan  1 tablet Oral BID   sodium chloride flush  10-40 mL Intracatheter Q12H   [START ON 04/25/2021] spironolactone  25 mg Oral Daily   Continuous Infusions:  PRN Meds:.acetaminophen **OR** acetaminophen, hydrALAZINE, senna-docusate, sodium chloride flush   I have personally reviewed following labs and imaging studies  LABORATORY DATA: CBC: Recent Labs  Lab 04/19/21 2359  WBC 6.2  NEUTROABS 3.5  HGB 13.5  HCT 41.4  MCV 84.1  PLT 326     Basic Metabolic Panel: Recent Labs  Lab 04/20/21 0722 04/21/21 0335 04/22/21 0300 04/23/21 0715 04/24/21 0500  NA 138 140 137 138 139  K 4.5 4.3 4.1 4.6 4.6  CL 102 102 100 104 108  CO2 28 31 32 28 26  GLUCOSE 200* 185* 186* 184* 138*  BUN 18 16 16 14 16   CREATININE 1.43* 1.22 1.50* 1.50* 1.42*  CALCIUM 8.2* 8.2* 8.1* 8.0* 8.2*  MG 1.8 1.7  --  2.1  --      GFR: Estimated Creatinine Clearance: 52.1 mL/min (A) (by C-G formula based on SCr of 1.42 mg/dL (H)).  Liver Function Tests: Recent Labs  Lab 04/19/21 2359  AST 23  ALT 16  ALKPHOS 204*  BILITOT 1.6*  PROT 6.3*  ALBUMIN 2.7*    No results for input(s): LIPASE, AMYLASE in the last 168 hours. No results for input(s): AMMONIA in the last 168 hours.  Coagulation Profile: No results for input(s): INR, PROTIME in the last 168 hours.  Cardiac Enzymes: No results for input(s): CKTOTAL, CKMB, CKMBINDEX, TROPONINI in the last 168  hours.  BNP (last 3 results) Recent Labs    02/03/21 0911  PROBNP 3,012.0*     Lipid Profile: No results for input(s): CHOL, HDL, LDLCALC, TRIG, CHOLHDL, LDLDIRECT in the last 72 hours.  Thyroid Function Tests: No results for input(s): TSH, T4TOTAL, FREET4, T3FREE, THYROIDAB in the last 72 hours.   Anemia Panel: No results for input(s): VITAMINB12, FOLATE, FERRITIN, TIBC, IRON, RETICCTPCT in the last 72 hours.  Urine analysis:    Component Value Date/Time   COLORURINE YELLOW 03/08/2021 0246   APPEARANCEUR HAZY (A) 03/08/2021 0246   LABSPEC 1.019 03/08/2021 0246   PHURINE 5.0 03/08/2021 0246   GLUCOSEU 150 (A) 03/08/2021 0246  HGBUR NEGATIVE 03/08/2021 0246   BILIRUBINUR NEGATIVE 03/08/2021 0246   BILIRUBINUR negative 01/02/2021 1312   KETONESUR NEGATIVE 03/08/2021 0246   PROTEINUR >=300 (A) 03/08/2021 0246   UROBILINOGEN 0.2 01/02/2021 1312   NITRITE NEGATIVE 03/08/2021 0246   LEUKOCYTESUR NEGATIVE 03/08/2021 0246    Sepsis Labs: Lactic Acid, Venous    Component Value Date/Time   LATICACIDVEN 1.5 04/20/2021 1736    MICROBIOLOGY: Recent Results (from the past 240 hour(s))  Resp Panel by RT-PCR (Flu A&B, Covid) Nasopharyngeal Swab     Status: None   Collection Time: 04/20/21  5:02 AM   Specimen: Nasopharyngeal Swab; Nasopharyngeal(NP) swabs in vial transport medium  Result Value Ref Range Status   SARS Coronavirus 2 by RT PCR NEGATIVE NEGATIVE Final    Comment: (NOTE) SARS-CoV-2 target nucleic acids are NOT DETECTED.  The SARS-CoV-2 RNA is generally detectable in upper respiratory specimens during the acute phase of infection. The lowest concentration of SARS-CoV-2 viral copies this assay can detect is 138 copies/mL. A negative result does not preclude SARS-Cov-2 infection and should not be used as the sole basis for treatment or other patient management decisions. A negative result may occur with  improper specimen collection/handling, submission of  specimen other than nasopharyngeal swab, presence of viral mutation(s) within the areas targeted by this assay, and inadequate number of viral copies(<138 copies/mL). A negative result must be combined with clinical observations, patient history, and epidemiological information. The expected result is Negative.  Fact Sheet for Patients:  BloggerCourse.com  Fact Sheet for Healthcare Providers:  SeriousBroker.it  This test is no t yet approved or cleared by the [place] FDA and  has been authorized for detection and/or diagnosis of SARS-CoV-2 by FDA under an Emergency Use Authorization (EUA). This EUA will remain  in effect (meaning this test can be used) for the duration of the COVID-19 declaration under Section 564(b)(1) of the Act, 21 U.S.C.section 360bbb-3(b)(1), unless the authorization is terminated  or revoked sooner.       Influenza A by PCR NEGATIVE NEGATIVE Final   Influenza B by PCR NEGATIVE NEGATIVE Final    Comment: (NOTE) The Xpert Xpress SARS-CoV-2/FLU/RSV plus assay is intended as an aid in the diagnosis of influenza from Nasopharyngeal swab specimens and should not be used as a sole basis for treatment. Nasal washings and aspirates are unacceptable for Xpert Xpress SARS-CoV-2/FLU/RSV testing.  Fact Sheet for Patients: BloggerCourse.com  Fact Sheet for Healthcare Providers: SeriousBroker.it  This test is not yet approved or cleared by the [place] FDA and has been authorized for detection and/or diagnosis of SARS-CoV-2 by FDA under an Emergency Use Authorization (EUA). This EUA will remain in effect (meaning this test can be used) for the duration of the COVID-19 declaration under Section 564(b)(1) of the Act, 21 U.S.C. section 360bbb-3(b)(1), unless the authorization is terminated or revoked.  Performed at [facility], [address]  [place], [place] [address]     RADIOLOGY STUDIES/RESULTS: MR CARDIAC MORPHOLOGY W WO CONTRAST  Result Date: 04/22/2021 CLINICAL DATA:  Cardiomyopathy of uncertain etiology EXAM: CARDIAC MRI TECHNIQUE: The patient was scanned on a 1.5 Tesla GE magnet. A dedicated cardiac coil was used. Functional imaging was done using Fiesta sequences. 2,3, and 4 chamber views were done to assess for RWMA's. Modified Simpson's rule using a short axis stack was used to calculate an ejection fraction on a dedicated work [employer]. The patient received 7 cc of Gadavist. After 10 minutes inversion recovery  sequences were used to assess for infiltration and scar tissue. CONTRAST:  Gadavist 7 cc FINDINGS: Limited images of the lung fields showed a small right pleural effusion. Normal left ventricular size with mild LV hypertrophy. Global hypokinesis with EF 24%. Mildly dilated right ventricle with EF 21%. Mild biatrial enlargement. Minimal mitral regurgitation visually. Trileaflet aortic valve with no significant regurgitation or stenosis. Delayed enhancement imaging: The myocardium was difficult to null. There appears to be diffuse mid-wall late gadolinium enhancement (LGE), particularly prominent in the septum and the lateral wall. There also may be some involvement of the RV free wall. MEASUREMENTS: MEASUREMENTS LVEDV 225 mL LVSV 54 mL LVEF 24% RVEDV 220 mL RVSV 47 mL RVEF 21% T2 49 septum, not significantly elevated. T1 1099, ECV 42% IMPRESSION: 1. Normal LV size with mild LV hypertrophy. EF 24%, diffuse hypokinesis. 2.  Mildly dilated RV with EF 21%. 3. On delayed enhancement imaging, the LV myocardium was difficult to null. There was diffuse mid-wall LGE particularly prominent in the septum and in the lateral wall. Also, suspect some involvement in the RV free wall. Coupled with extracellular volume percentage 42%, would consider the possibility of cardiac amyloidosis. Cannot rule out a diffuse process such as  myocarditis though T2 not elevated. Dalton Mclean Electronically Signed   By: Loralie Champagne M.D.   On: 04/22/2021 14:13     LOS: 4 days   Oren Binet, MD  [facility]    To contact the attending provider between 7A-7P or the covering provider during after hours 7P-7A, please log into the web site [email or web address] and access using universal [employer] password for that web site. If you do not have the password, please call the hospital operator.  04/24/2021, 11:03 AM

## 2021-04-25 DIAGNOSIS — I5023 Acute on chronic systolic (congestive) heart failure: Secondary | ICD-10-CM | POA: Diagnosis not present

## 2021-04-25 LAB — GLUCOSE, CAPILLARY
Glucose-Capillary: 178 mg/dL — ABNORMAL HIGH (ref 70–99)
Glucose-Capillary: 260 mg/dL — ABNORMAL HIGH (ref 70–99)
Glucose-Capillary: 258 mg/dL — ABNORMAL HIGH (ref 70–99)
Glucose-Capillary: 160 mg/dL — ABNORMAL HIGH (ref 70–99)

## 2021-04-25 LAB — BASIC METABOLIC PANEL
Anion gap: 8 (ref 5–15)
BUN: 17 mg/dL (ref 6–20)
CO2: 26 mmol/L (ref 22–32)
Calcium: 8.4 mg/dL — ABNORMAL LOW (ref 8.9–10.3)
Chloride: 103 mmol/L (ref 98–111)
Creatinine, Ser: 1.4 mg/dL — ABNORMAL HIGH (ref 0.61–1.24)
GFR, Estimated: 58 mL/min — ABNORMAL LOW (ref >60)
Glucose, Bld: 184 mg/dL — ABNORMAL HIGH (ref 70–99)
Potassium: 4.6 mmol/L (ref 3.5–5.1)
Sodium: 137 mmol/L (ref 135–145)

## 2021-04-25 LAB — COOXEMETRY PANEL
Carboxyhemoglobin: 1.2 % (ref 0.5–1.5)
Methemoglobin: 1 % (ref 0.0–1.5)
O2 Saturation: 80.8 %
Total hemoglobin: 13.9 g/dL (ref 12.0–16.0)

## 2021-04-25 NOTE — Progress Notes (Signed)
Notified by RN that pt has swelling and pain at PICC site. Evaluated by IV team and still functional. Obtain U/S left arm to make sure no DVT present

## 2021-04-25 NOTE — Progress Notes (Signed)
                       PROGRESS NOTE        PATIENT DETAILS Name: Thomas West Age: 60 y.o. Sex: male Date of Birth: 06/22/1960 Admit Date: 04/19/2021 Admitting Physician Shanker M Ghimire, MD PCP:Jordan, [name], MD  Brief Narrative: Patient is a 60 y.o. male with history of HFrEF, DM-2, HTN, HLD, CKD stage IIIa-noncompliant with follow-up and medications-presenting with at least 1 week history of exertional dyspnea, lower extremity edema-found to have decompensated systolic heart failure.  Subjective:  Patient in bed, appears comfortable, denies any headache, no fever, no chest pain or pressure, no shortness of breath , no abdominal pain. No focal weakness.  Objective: Vitals: Blood pressure 114/69, pulse 84, temperature 98.4 F (36.9 C), temperature source Oral, resp. rate (!) 23, height 5' 9" (1.753 m), weight 65.3 kg, SpO2 99 %.   Exam:  Awake Alert, No new F.N deficits, Normal affect [place].AT,PERRAL Supple Neck, No JVD,   Symmetrical Chest wall movement, Good air movement bilaterally, CTAB RRR,No Gallops, Rubs or new Murmurs,  +ve B.Sounds, Abd Soft, No tenderness,   No Cyanosis, left foot in pro-form boot with heel elevation   Pertinent Labs/Radiology: Recent Labs  Lab 04/19/21 2359 04/20/21 0722 04/25/21 0427  WBC 6.2  --   --   HGB 13.5  --   --   PLT 326  --   --   NA 138   < > 137  K 4.1   < > 4.6  CREATININE 1.50*   < > 1.40*  AST 23  --   --   ALT 16  --   --   ALKPHOS 204*  --   --   BILITOT 1.6*  --   --    < > = values in this interval not displayed.     Assessment/Plan: HFrEF with exacerbation: Due to noncompliance to medications and follow-up.  Volume status is markedly improved-he is close to becoming euvolemic.  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  CHF team following-myeloma panel sent on 12/1 PYP scan highly suspicious for multiple myeloma.  Continue Entresto, Coreg, hydralazine/, Imdur and Aldactone.  We will have him follow  outpatient with CHF team along with oncology  DM-2 (A1c 11.7 on 11/28): CBGs relatively stable with SSI-has refused insulin numerous times in the hospital.  Very poor insight to his underlying issues-do not think he is a good candidate for outpatient insulin therapy-risk of hypoglycemia would be very high in my opinion.  Continue SSI for now-when closer to discharge-we will place on oral hypoglycemic agents.  Recent Labs    04/24/21 2112 04/25/21 0426 04/25/21 1114  GLUCAP 213* 178* 260*     HTN: BP stable-continue Coreg/Imdur/hydralazine/Entresto   History of CVA: Continue antiplatelet/statin  Nonobstructive CAD: Continue antiplatelets.  Underwent LHC during his most recent hospitalization  CKD stage IIIa: Creatinine close to baseline-watch closely  HLD: Continue Lipitor  Noncompliance to medication/follow-up: Counseled extensively-he initially was adamant about not wanting CHF better medicine program-however today he claims he has changed his mind and now consents to telemedicine program.  I have sent a epic secure chat to Amy Clegg-NP.  BMI Estimated body mass index is 21.26 kg/m as calculated from the following:   Height as of this encounter: 5' 9" (1.753 m).   Weight as of this encounter: 65.3 kg.   Left heel deep tissue pressure injury: Present prior to admission-continue   care per wound care.  Procedures: None Consults: Cardioogy DVT Prophylaxis: Lovenox Code Status:Full code  Family Communication: None at bedside  Time spent: 25 minutes-Greater than 50% of this time was spent in counseling, explanation of diagnosis, planning of further management, and coordination of care.   Disposition Plan: Status is: Observation  The patient will require care spanning > 2 midnights and should be moved to inpatient because: Grossly volume overloaded-needs IV diuretics to achieve euvolemia.   Diet: Diet Order             Diet heart healthy/carb modified Room service  appropriate? Yes; Fluid consistency: Thin; Fluid restriction: 1200 mL Fluid  Diet effective now                     Antimicrobial agents: Anti-infectives (From admission, onward)    None        MEDICATIONS: Scheduled Meds:  aspirin EC  81 mg Oral Daily   atorvastatin  80 mg Oral QPM   carvedilol  6.25 mg Oral BID   Chlorhexidine Gluconate Cloth  6 each Topical Daily   clopidogrel  75 mg Oral Daily   enoxaparin (LOVENOX) injection  40 mg Subcutaneous Q24H   hydrALAZINE  50 mg Oral Q8H   insulin aspart  0-5 Units Subcutaneous QHS   insulin aspart  0-9 Units Subcutaneous TID WC   isosorbide mononitrate  30 mg Oral Daily   sacubitril-valsartan  1 tablet Oral BID   sodium chloride flush  10-40 mL Intracatheter Q12H   spironolactone  25 mg Oral Daily   Continuous Infusions:  PRN Meds:.acetaminophen **OR** acetaminophen, hydrALAZINE, senna-docusate, sodium chloride flush   I have personally reviewed following labs and imaging studies  LABORATORY DATA: CBC: Recent Labs  Lab 04/19/21 2359  WBC 6.2  NEUTROABS 3.5  HGB 13.5  HCT 41.4  MCV 84.1  PLT 326    Basic Metabolic Panel: Recent Labs  Lab 04/20/21 0722 04/21/21 0335 04/22/21 0300 04/23/21 0715 04/24/21 0500 04/25/21 0427  NA 138 140 137 138 139 137  K 4.5 4.3 4.1 4.6 4.6 4.6  CL 102 102 100 104 108 103  CO2 28 31 32 28 26 26  GLUCOSE 200* 185* 186* 184* 138* 184*  BUN 18 16 16 14 16 17  CREATININE 1.43* 1.22 1.50* 1.50* 1.42* 1.40*  CALCIUM 8.2* 8.2* 8.1* 8.0* 8.2* 8.4*  MG 1.8 1.7  --  2.1  --   --     GFR: Estimated Creatinine Clearance: 53.1 mL/min (A) (by C-G formula based on SCr of 1.4 mg/dL (H)).  Liver Function Tests: Recent Labs  Lab 04/19/21 2359  AST 23  ALT 16  ALKPHOS 204*  BILITOT 1.6*  PROT 6.3*  ALBUMIN 2.7*   No results for input(s): LIPASE, AMYLASE in the last 168 hours. No results for input(s): AMMONIA in the last 168 hours.  Coagulation Profile: No results for  input(s): INR, PROTIME in the last 168 hours.  Cardiac Enzymes: No results for input(s): CKTOTAL, CKMB, CKMBINDEX, TROPONINI in the last 168 hours.  BNP (last 3 results) Recent Labs    02/03/21 0911  PROBNP 3,012.0*    Lipid Profile: No results for input(s): CHOL, HDL, LDLCALC, TRIG, CHOLHDL, LDLDIRECT in the last 72 hours.  Thyroid Function Tests: No results for input(s): TSH, T4TOTAL, FREET4, T3FREE, THYROIDAB in the last 72 hours.   Anemia Panel: No results for input(s): VITAMINB12, FOLATE, FERRITIN, TIBC, IRON, RETICCTPCT in the last 72 hours.  Urine analysis:      Component Value Date/Time   COLORURINE YELLOW 03/08/2021 0246   APPEARANCEUR HAZY (A) 03/08/2021 0246   LABSPEC 1.019 03/08/2021 0246   PHURINE 5.0 03/08/2021 0246   GLUCOSEU 150 (A) 03/08/2021 0246   HGBUR NEGATIVE 03/08/2021 0246   BILIRUBINUR NEGATIVE 03/08/2021 0246   BILIRUBINUR negative 01/02/2021 1312   KETONESUR NEGATIVE 03/08/2021 0246   PROTEINUR >=300 (A) 03/08/2021 0246   UROBILINOGEN 0.2 01/02/2021 1312   NITRITE NEGATIVE 03/08/2021 0246   LEUKOCYTESUR NEGATIVE 03/08/2021 0246    Sepsis Labs: Lactic Acid, Venous    Component Value Date/Time   LATICACIDVEN 1.5 04/20/2021 1736    RADIOLOGY STUDIES/RESULTS: NM CARDIAC AMYLOID TUMOR LOC INFLAM SPECT 1 DAY  Result Date: 04/24/2021 CLINICAL DATA:  HEART FAILURE. CONCERN FOR CARDIAC AMYLOIDOSIS. History type II diabetes mellitus, hypertension, stage III A chronic kidney disease, hyperlipidemia, CHF, coronary artery disease post MI EXAM: NUCLEAR MEDICINE TUMOR LOCALIZATION. PYP CARDIAC AMYLOIDOSIS SCAN WITH SPECT TECHNIQUE: Following intravenous administration of radiopharmaceutical, anterior planar images of the chest were obtained. Regions of interest were placed on the heart and contralateral chest wall for quantitative assessment. Additional SPECT imaging of the chest was obtained. RADIOPHARMACEUTICALS:  19.45 mCi TECHNETIUM 99 PYROPHOSPHATE  FINDINGS: Planar Visual assessment: Anterior planar imaging demonstrates radiotracer uptake within the heart equal to uptake within the adjacent ribs (Grade 2). Quantitative assessment : Quantitative assessment of the cardiac uptake compared to the contralateral chest wall is equal to 1.11 (H/CL = 1.11). SPECT assessment: SPECT imaging of the chest demonstrates mild radiotracer accumulation within the LEFT ventricle. IMPRESSION: Visual and quantitative assessment (grade 2, H/CLL equal 1.11) are strongly suggestive of transthyretin amyloidosis. Electronically Signed   By: Mark  Boles M.D.   On: 04/24/2021 16:20     LOS: 5 days   Signature  Prashant Singh M.D on 04/25/2021 at 12:47 PM   -  To page go to [email or web address]          

## 2021-04-25 NOTE — Progress Notes (Addendum)
CARDIAC REHAB PHASE I   PRE:  Rate/Rhythm: 83 SR  BP:  Supine: 114/71  Sitting:   Standing:    SaO2: 98 RA  MODE:  Ambulation: 200 ft   POST:  Rate/Rhythm: 85 SR  BP:  Supine:   Sitting: 98/61  Standing:    SaO2: 100 RA 1345-1500 Assisted X 2 used walker and gait belt to ambulate. Gait steady with walker. Pt walked 200 feet with 3 short standing rest stops. VS stable. Pt to recliner after walk with call light in reach. He did c/o of weakness with walking but did well. Reminded pt how important that taking all of medications was to be able to stay out of the hospital and for the fluid not to return. Pt had stool in his bed. Washed pt's perineal areas and underarms, gown changed. Placed nonskid sock on left foot that has air boot on it. Pt needs a shoe/ boot that he can safely walk in and that will protect his heel wound at discharge.  Melina Copa RN 04/25/2021 3:04 PM

## 2021-04-25 NOTE — Discharge Instructions (Addendum)
Keep your L. Heel elevated over the foam block and wear the boot when you are in the bed.  Follow with Primary MD Thomas West, Thomas G, MD and your Cardiologist in 7 days   Get CBC, CMP, 2 view Chest X ray -  checked next visit within 1 week by Primary MD    Activity: As tolerated with Full fall precautions use walker/cane & assistance as needed  Disposition Home   Diet: Heart Healthy - Low Carb, 1.5 lit/day fluid restriction.  Check your Weight same time everyday, if you gain over 2 pounds, or you develop in leg swelling, experience more shortness of breath or chest pain, call your Primary MD immediately. Follow Cardiac Low Salt Diet and 1.5 lit/day fluid restriction.  Special Instructions: If you have smoked or chewed Tobacco  in the last 2 yrs please stop smoking, stop any regular Alcohol  and or any Recreational drug use.  On your next visit with your primary care physician please Get Medicines reviewed and adjusted.  Please request your Prim.MD to go over all Hospital Tests and Procedure/Radiological results at the follow up, please get all Hospital records sent to your Prim MD by signing hospital release before you go home.  If you experience worsening of your admission symptoms, develop shortness of breath, life threatening emergency, suicidal or homicidal thoughts you must seek medical attention immediately by calling 911 or calling your MD immediately  if symptoms less severe.  You Must read complete instructions/literature along with all the possible adverse reactions/side effects for all the Medicines you take and that have been prescribed to you. Take any new Medicines after you have completely understood and accpet all the possible adverse reactions/side effects.

## 2021-04-26 ENCOUNTER — Inpatient Hospital Stay (HOSPITAL_COMMUNITY): Payer: Medicare Other

## 2021-04-26 DIAGNOSIS — R609 Edema, unspecified: Secondary | ICD-10-CM

## 2021-04-26 DIAGNOSIS — I5023 Acute on chronic systolic (congestive) heart failure: Secondary | ICD-10-CM | POA: Diagnosis not present

## 2021-04-26 LAB — BASIC METABOLIC PANEL
Anion gap: 6 (ref 5–15)
BUN: 16 mg/dL (ref 6–20)
CO2: 26 mmol/L (ref 22–32)
Calcium: 8.4 mg/dL — ABNORMAL LOW (ref 8.9–10.3)
Chloride: 104 mmol/L (ref 98–111)
Creatinine, Ser: 1.27 mg/dL — ABNORMAL HIGH (ref 0.61–1.24)
GFR, Estimated: >60 mL/min (ref >60)
Glucose, Bld: 207 mg/dL — ABNORMAL HIGH (ref 70–99)
Potassium: 4.5 mmol/L (ref 3.5–5.1)
Sodium: 136 mmol/L (ref 135–145)

## 2021-04-26 LAB — COOXEMETRY PANEL
Carboxyhemoglobin: 1.1 % (ref 0.5–1.5)
Methemoglobin: 0.9 % (ref 0.0–1.5)
O2 Saturation: 72.6 %
Total hemoglobin: 13.7 g/dL (ref 12.0–16.0)

## 2021-04-26 LAB — GLUCOSE, CAPILLARY
Glucose-Capillary: 209 mg/dL — ABNORMAL HIGH (ref 70–99)
Glucose-Capillary: 268 mg/dL — ABNORMAL HIGH (ref 70–99)

## 2021-04-26 NOTE — TOC Transition Note (Signed)
Transition of Care [facility]) - CM/SW Discharge Note   Patient Details  Name: Jaysiah Marchetta MRN: [id number] Date of Birth: 1961/04/15  Transition of Care [facility]) CM/SW Contact:  Lawerance Sabal, RN Phone Number: 04/26/2021, 9:04 AM   Clinical Narrative:    [facility] unable to accept for [facility] services. [facility] able to accept for PT OT RN dressing changes. Per CM handoff, meds filled Friday by [facility] pharmacy and delivered to room.  No other CM needs identified.      Barriers to Discharge: No Barriers Identified   Patient Goals and CMS Choice Patient states their goals for this hospitalization and ongoing recovery are:: feels he will do well at home CMS Medicare.gov Compare Post Acute Care list provided to:: Patient Choice offered to / list presented to : Patient  Discharge Placement                       Discharge Plan and Services In-house Referral: Clinical Social Work Discharge Planning Services: CM Consult Post Acute Care Choice: Home Health          DME Arranged: N/A         HH Arranged: RN, PT, OT HH Agency: [facility] Date [facility] Agency Contacted: 04/26/21 Time HH Agency Contacted: [phone] Representative spoke with at [facility] Agency: Kandee Keen  Social Determinants of Health (SDOH) Interventions     Readmission Risk Interventions No flowsheet data found.

## 2021-04-26 NOTE — Progress Notes (Signed)
VASCULAR LAB    Right upper extremity venous duplex has been performed.  See CV proc for preliminary results.   Bobetta Korf, RVT 04/26/2021, 9:24 AM

## 2021-04-26 NOTE — Progress Notes (Signed)
Mobility Specialist: Progress Note   04/26/21 1156  Mobility  Activity Ambulated in hall  Level of Assistance Modified independent, requires aide device or extra time  Assistive Device Front wheel walker  Distance Ambulated (ft) 210 ft  Mobility Ambulated with assistance in hallway  Mobility Response Tolerated well  Mobility performed by Mobility specialist  Bed Position Chair  $Mobility charge 1 Mobility   Pt independent to stand and mod I during ambulation. Pt c/o 8/10 pain in his LLE during session, otherwise no c/o. Pt back to recliner after walk with call bell in reach.   [facility] Shenee Wignall Mobility Specialist Mobility Specialist Phone #1: [phone] Mobility Specialist Phone #2: [phone]

## 2021-04-26 NOTE — Discharge Summary (Signed)
Thomas West:[id number] DOB: Sep 24, 1960 DOA: 04/19/2021  PCP: Thomas, [name], MD  Admit date: 04/19/2021  Discharge date: 04/26/2021  Admitted From: Home   Disposition:  Home   Recommendations for Outpatient Follow-up:   Follow up with PCP in 1-2 weeks  PCP Please obtain BMP/CBC, 2 view CXR in 1week,  (see Discharge instructions)   PCP Please follow up on the following pending results: Monitor CBC, CMP, magnesium and a two-view chest x-ray in 7 to 10 days.  Needs outpatient follow-up with cardiology and oncology within 1 to 2 weeks of discharge for new diagnosis of amyloidosis.  Kindly follow results of multiple myeloma panel which are pending.   Home Health: PT, OT, RN   Equipment/Devices: None  Consultations: Cards Discharge Condition: Stable    CODE STATUS: Full    Diet Recommendation: Heart Healthy low carbohydrate with 1.5 L fluid restriction per day.    Chief Complaint  Patient presents with   Shortness of Breath     Brief history of present illness from the day of admission and additional interim summary    Patient is a 60 y.o. male with history of HFrEF, DM-2, HTN, HLD, CKD stage IIIa-noncompliant with follow-up and medications-presenting with at least 1 week history of exertional dyspnea, lower extremity edema-found to have decompensated systolic heart failure.                                                                 Hospital Course   HFrEF with exacerbation: Due to noncompliance to medications and follow-up.  Volume status is markedly improved-he is close to becoming euvolemic.  Cardiac MRI was suggestive of amyloidosis along with PYP scan which was also suspicious for amyloidosis.  Pending myeloma panel sent on 12/1 he is now stable mentation of Entresto, Coreg, hydralazine/, Imdur and  Aldactone.  We will have him follow outpatient with CHF team along with oncology, request PCP to monitor multiple myeloma panel results and ensure outpatient cardiology and hematology follow-up.  HTN: BP stable-continue Coreg/Imdur/hydralazine/Entresto    History of CVA: Continue antiplatelet/statin   Nonobstructive CAD: Continue antiplatelets.  Underwent LHC during his most recent hospitalization   CKD stage IIIa: Creatinine close to baseline-watch closely   HLD: Continue Lipitor   Noncompliance to medication/follow-up: Counseled extensively-he initially was adamant about not wanting CHF better medicine program-however after counseling he has agreed for follow-ups.   Left heel deep tissue pressure injury: Present prior to admission-continue care per wound care.  Given pro-form boot for home with home RN.  DM-2 (A1c 11.7 on 11/28): CBGs relatively stable with SSI-has refused insulin numerous times in the hospital.  Very poor insight to his underlying issues-do not think he is a good candidate for outpatient insulin therapy-risk of hypoglycemia would be very high in my opinion.  Discharged on combination of Jardiance along with Glucophage.  PCP to monitor.  Discharge diagnosis     Principal Problem:   CHF exacerbation ([name]) Active Problems:   CAD (coronary artery disease)   HLD (hyperlipidemia)   Hypertension, essential, benign   Diabetes mellitus ([name])   Acute on chronic systolic heart failure [facility])    Discharge instructions    Discharge Instructions     Discharge instructions   Complete by: As directed    Keep your L. Heel elevated over the foam block and wear the boot when you are in the bed.  Follow with Primary MD Thomas, [name], MD and your Cardiologist in 7 days   Get CBC, CMP, 2 view Chest X ray -  checked next visit within 1 week by Primary MD    Activity: As tolerated with Full fall precautions use walker/cane & assistance as needed  Disposition Home   Diet:  Heart Healthy - Low Carb, 1.5 lit/day fluid restriction.  Check your Weight same time everyday, if you gain over 2 pounds, or you develop in leg swelling, experience more shortness of breath or chest pain, call your Primary MD immediately. Follow Cardiac Low Salt Diet and 1.5 lit/day fluid restriction.  Special Instructions: If you have smoked or chewed Tobacco  in the last 2 yrs please stop smoking, stop any regular Alcohol  and or any Recreational drug use.  On your next visit with your primary care physician please Get Medicines reviewed and adjusted.  Please request your Prim.MD to go over all Hospital Tests and Procedure/Radiological results at the follow up, please get all Hospital records sent to your Prim MD by signing hospital release before you go home.  If you experience worsening of your admission symptoms, develop shortness of breath, life threatening emergency, suicidal or homicidal thoughts you must seek medical attention immediately by calling 911 or calling your MD immediately  if symptoms less severe.  You Must read complete instructions/literature along with all the possible adverse reactions/side effects for all the Medicines you take and that have been prescribed to you. Take any new Medicines after you have completely understood and accpet all the possible adverse reactions/side effects.   Discharge wound care:   Complete by: As directed    Wound care to left heel DTPI: Paint with betadine swabstick and allow to air dry. When dry, cover with dry gauze and secure with a few turns of Kerlix roll gauze/paper tape. Place foot into Prevalon boot while in bed.   Increase activity slowly   Complete by: As directed        Discharge Medications   Allergies as of 04/26/2021       Reactions   Bee Venom    swelling        Medication List     STOP taking these medications    empagliflozin 10 MG Tabs tablet Commonly known as: JARDIANCE   furosemide 40 MG  tablet Commonly known as: LASIX   glipiZIDE 10 MG 24 hr tablet Commonly known as: GLUCOTROL XL   polyethylene glycol 17 West packet Commonly known as: MIRALAX / GLYCOLAX Replaced by: polyethylene glycol powder 17 GM/SCOOP powder   Semaglutide 3 MG Tabs       TAKE these medications    Aspirin Low Dose 81 MG EC tablet Generic drug: aspirin Take 1 tablet (81 mg total) by mouth daily.   atorvastatin 80 MG tablet Commonly known as: LIPITOR Take 1 tablet (80 mg total) by mouth  daily.   carvedilol 6.25 MG tablet Commonly known as: COREG Take 1 tablet (6.25 mg total) by mouth EVERY 12 HOURS (10AM &10PM) What changed:  medication strength how much to take how to take this when to take this additional instructions   clopidogrel 75 MG tablet Commonly known as: PLAVIX Take 1 tablet (75 mg total) by mouth daily.   Entresto 24-26 MG Generic drug: sacubitril-valsartan Take 1 tablet by mouth 2 (two) times daily.   hydrALAZINE 50 MG tablet Commonly known as: APRESOLINE Take 1 tablet (50 mg total) by mouth 3 (three) times daily. What changed: See the new instructions.   isosorbide mononitrate 30 MG 24 hr tablet Commonly known as: IMDUR Take 1 tablet (30 mg total) by mouth daily. What changed:  medication strength how much to take   Januvia 25 MG tablet Generic drug: sitaGLIPtin Take 1 tablet (25 mg total) by mouth daily.   metFORMIN 500 MG tablet Commonly known as: Glucophage Take 1 tablet (500 mg total) by mouth 2 (two) times daily with a meal.   polyethylene glycol powder 17 GM/SCOOP powder Commonly known as: GLYCOLAX/MIRALAX Take 1 capful (17 West) with water by mouth daily. Replaces: polyethylene glycol 17 West packet   spironolactone 25 MG tablet Commonly known as: ALDACTONE Take 1 tablet (25 mg total) by mouth daily.               Discharge Care Instructions  (From admission, onward)           Start     Ordered   04/26/21 0000  Discharge wound care:        Comments: Wound care to left heel DTPI: Paint with betadine swabstick and allow to air dry. When dry, cover with dry gauze and secure with a few turns of Kerlix roll gauze/paper tape. Place foot into Prevalon boot while in bed.   04/26/21 1050             Follow-up Information     Thomas, [name], MD. Schedule an appointment as soon as possible for a visit in 1 week(s).   Specialty: Family Medicine Contact information: [place] [address] [phone]         Josue Hector, MD. Schedule an appointment as soon as possible for a visit in 1 week(s).   Specialty: Cardiology Contact information: [address] [place] [address] [phone]         Bensimhon, Shaune Pascal, MD. Schedule an appointment as soon as possible for a visit in 1 week(s).   Specialty: Cardiology Contact information: [address] [place] [address] [phone]         Brunetta Genera, MD. Schedule an appointment as soon as possible for a visit in 1 week(s).   Specialties: Hematology, Oncology Why: Possible multiple myeloma on PYP scan Contact information: [place] [address] [phone]         Care, [facility] Follow up.   Specialty: Home Health Services Why: For home health services PT OT RN Contact information: [place] [address] [phone]                 Major procedures and Radiology Reports - PLEASE review detailed and final reports thoroughly  -       DG Chest 1 View  Result Date: 04/20/2021 CLINICAL DATA:  Chest pain and tightness. EXAM: CHEST  1 VIEW  COMPARISON:  March 08, 2021 FINDINGS: Mild, stable right infrahilar linear scarring and/or atelectasis is seen. There is no evidence of a pleural effusion or pneumothorax. Mild, stable prominence of the pulmonary vasculature is seen. The cardiac silhouette is mildly  enlarged and unchanged in size. Mild calcification of the aortic arch is noted. The visualized skeletal structures are unremarkable. IMPRESSION: 1. Mild, stable right infrahilar linear scarring and/or atelectasis. 2. Stable cardiomegaly with mild pulmonary vascular congestion. Electronically Signed   By: Virgina Norfolk M.D.   On: 04/20/2021 00:59   NM CARDIAC AMYLOID TUMOR LOC INFLAM SPECT 1 DAY  Result Date: 04/24/2021 CLINICAL DATA:  HEART FAILURE. CONCERN FOR CARDIAC AMYLOIDOSIS. History type II diabetes mellitus, hypertension, stage III A chronic kidney disease, hyperlipidemia, CHF, coronary artery disease post MI EXAM: NUCLEAR MEDICINE TUMOR LOCALIZATION. PYP CARDIAC AMYLOIDOSIS SCAN WITH SPECT TECHNIQUE: Following intravenous administration of radiopharmaceutical, anterior planar images of the chest were obtained. Regions of interest were placed on the heart and contralateral chest wall for quantitative assessment. Additional SPECT imaging of the chest was obtained. RADIOPHARMACEUTICALS:  19.45 mCi TECHNETIUM 99 PYROPHOSPHATE FINDINGS: Planar Visual assessment: Anterior planar imaging demonstrates radiotracer uptake within the heart equal to uptake within the adjacent ribs (Grade 2). Quantitative assessment : Quantitative assessment of the cardiac uptake compared to the contralateral chest wall is equal to 1.11 (H/CL = 1.11). SPECT assessment: SPECT imaging of the chest demonstrates mild radiotracer accumulation within the LEFT ventricle. IMPRESSION: Visual and quantitative assessment (grade 2, H/CLL equal 1.11) are strongly suggestive of transthyretin amyloidosis. Electronically Signed   By: Lavonia Dana M.D.   On: 04/24/2021 16:20   MR CARDIAC MORPHOLOGY W WO CONTRAST  Result Date: 04/22/2021 CLINICAL DATA:  Cardiomyopathy of uncertain etiology EXAM: CARDIAC MRI TECHNIQUE: The patient was scanned on a 1.5 Tesla GE magnet. A dedicated cardiac coil was used. Functional imaging was done using Fiesta  sequences. 2,3, and 4 chamber views were done to assess for RWMA's. Modified Simpson's rule using a short axis stack was used to calculate an ejection fraction on a dedicated work Conservation officer, nature. The patient received 7 cc of Gadavist. After 10 minutes inversion recovery sequences were used to assess for infiltration and scar tissue. CONTRAST:  Gadavist 7 cc FINDINGS: Limited images of the lung fields showed a small right pleural effusion. Normal left ventricular size with mild LV hypertrophy. Global hypokinesis with EF 24%. Mildly dilated right ventricle with EF 21%. Mild biatrial enlargement. Minimal mitral regurgitation visually. Trileaflet aortic valve with no significant regurgitation or stenosis. Delayed enhancement imaging: The myocardium was difficult to null. There appears to be diffuse mid-wall late gadolinium enhancement (LGE), particularly prominent in the septum and the lateral wall. There also may be some involvement of the RV free wall. MEASUREMENTS: MEASUREMENTS LVEDV 225 mL LVSV 54 mL LVEF 24% RVEDV 220 mL RVSV 47 mL RVEF 21% T2 49 septum, not significantly elevated. T1 1099, ECV 42% IMPRESSION: 1. Normal LV size with mild LV hypertrophy. EF 24%, diffuse hypokinesis. 2.  Mildly dilated RV with EF 21%. 3. On delayed enhancement imaging, the LV myocardium was difficult to null. There was diffuse mid-wall LGE particularly prominent in the septum and in the lateral wall. Also, suspect some involvement in the RV free wall. Coupled with extracellular volume percentage 42%, would consider the possibility of cardiac amyloidosis. Cannot rule out a diffuse process such as myocarditis though T2 not elevated. Dalton Mclean Electronically Signed   By: Loralie Champagne M.D.   On:  04/22/2021 14:13   [place] EKG SITE RITE  Result Date: 04/20/2021 If Site Rite image not attached, placement could not be confirmed due to current cardiac rhythm.  US Abdomen Limited RUQ (LIVER/GB)  Result Date:  04/20/2021 CLINICAL DATA:  Elevated liver function tests EXAM: ULTRASOUND ABDOMEN LIMITED RIGHT UPPER QUADRANT COMPARISON:  None. FINDINGS: Gallbladder: Incomplete distension which accounts for prominent wall thickness. No focal tenderness. Common bile duct: Diameter: 4 mm Liver: No focal lesion identified. Within normal limits in parenchymal echogenicity. Portal vein is patent on color Doppler imaging with normal direction of blood flow towards the liver. Other: Small volume simple ascites in the right upper quadrant IMPRESSION: 1. Negative biliary tree. 2. Small volume right upper quadrant ascites. Electronically Signed   By: Jorje Guild M.D.   On: 04/20/2021 06:18     Today   Subjective    Thomas West today has no headache,no chest abdominal pain,no new weakness tingling or numbness, feels much better wants to go home today.     Objective   Blood pressure 130/89, pulse 83, temperature 97.9 F (36.6 C), temperature source Oral, resp. rate (!) 21, height _0  (1.753 m), weight 65.3 kg, SpO2 99 %.   Intake/Output Summary (Last 24 hours) at 04/26/2021 1055 Last data filed at 04/26/2021 0816 Gross per 24 hour  Intake 480 ml  Output 1150 ml  Net -670 ml    Exam  Awake Alert, No new F.N deficits, Normal affect [place].AT,PERRAL Supple Neck,No JVD, No cervical lymphadenopathy appriciated.  Symmetrical Chest wall movement, Good air movement bilaterally, CTAB RRR,No Gallops,Rubs or new Murmurs, No Parasternal Heave +ve B.Sounds, Abd Soft, Non tender, No organomegaly appriciated, No rebound -guarding or rigidity. No Cyanosis, L heel/foot in proform boot.   Data Review   CBC w Diff:  Lab Results  Component Value Date   WBC 6.2 04/19/2021   HGB 13.5 04/19/2021   HGB 16.0 01/02/2021   HCT 41.4 04/19/2021   HCT 50.0 01/02/2021   PLT 326 04/19/2021   PLT 266 01/02/2021   LYMPHOPCT 26 04/19/2021   MONOPCT 12 04/19/2021   EOSPCT 4 04/19/2021   BASOPCT 1 04/19/2021    CMP:  Lab  Results  Component Value Date   NA 136 04/26/2021   NA 135 01/02/2021   K 4.5 04/26/2021   CL 104 04/26/2021   CO2 26 04/26/2021   BUN 16 04/26/2021   BUN 18 01/02/2021   CREATININE 1.27 (H) 04/26/2021   PROT 6.3 (L) 04/19/2021   PROT 6.0 01/02/2021   ALBUMIN 2.7 (L) 04/19/2021   ALBUMIN 3.4 (L) 01/02/2021   BILITOT 1.6 (H) 04/19/2021   BILITOT 1.2 01/02/2021   ALKPHOS 204 (H) 04/19/2021   AST 23 04/19/2021   ALT 16 04/19/2021  .   Total Time in preparing paper work, data evaluation and todays exam - 70 minutes  Lala Lund M.D on 04/26/2021 at 10:55 AM  [facility]

## 2021-04-26 NOTE — Progress Notes (Signed)
Patient complained about pain ot the PICC site. Swelling noted mostly below the dressing. No redness on the insertion site.dressing intact. Both picc lumens are patent. Blood return noted. IV team came and checked DR Chotiner was noted. Waiting for Vas [place].Marland Kitchen

## 2021-04-26 NOTE — Social Work (Signed)
CSW provided pt with a taxi voucher and  a shirt from the clothing closet.CSW discussed with RN how to call [employer] taxi when pt is ready.

## 2021-04-27 ENCOUNTER — Telehealth: Payer: Self-pay

## 2021-04-27 NOTE — Telephone Encounter (Signed)
Transition Care Management Unsuccessful Follow-up Telephone Call  Date of discharge and from where:  04/26/2021 [facility]  Attempts:  1st Attempt  Reason for unsuccessful TCM follow-up call:  Unable to reach patient

## 2021-04-28 ENCOUNTER — Telehealth: Payer: Self-pay

## 2021-04-28 LAB — MULTIPLE MYELOMA PANEL, SERUM
Albumin SerPl Elph-Mcnc: 2.4 g/dL — ABNORMAL LOW (ref 2.9–4.4)
Albumin/Glob SerPl: 0.8 (ref 0.7–1.7)
Alpha 1: 0.2 g/dL (ref 0.0–0.4)
Alpha2 Glob SerPl Elph-Mcnc: 0.7 g/dL (ref 0.4–1.0)
B-Globulin SerPl Elph-Mcnc: 0.9 g/dL (ref 0.7–1.3)
Gamma Glob SerPl Elph-Mcnc: 1.3 g/dL (ref 0.4–1.8)
Globulin, Total: 3.1 g/dL (ref 2.2–3.9)
IgA: 325 mg/dL (ref 90–386)
IgG (Immunoglobin G), Serum: 1380 mg/dL (ref 603–1613)
IgM (Immunoglobulin M), Srm: 98 mg/dL (ref 20–172)
Total Protein ELP: 5.5 g/dL — ABNORMAL LOW (ref 6.0–8.5)

## 2021-04-28 LAB — UPEP/UIFE/LIGHT CHAINS/TP, 24-HR UR
% BETA, Urine: 10.4 %
ALPHA 1 URINE: 6.8 %
Albumin, U: 66 %
Alpha 2, Urine: 5.4 %
Free Kappa Lt Chains,Ur: 63.97 mg/L (ref 1.17–86.46)
Free Kappa/Lambda Ratio: 8.73 (ref 1.83–14.26)
Free Lambda Lt Chains,Ur: 7.33 mg/L (ref 0.27–15.21)
GAMMA GLOBULIN URINE: 11.3 %
Total Protein, Urine-Ur/day: 746 mg/24 hr — ABNORMAL HIGH (ref 30–150)
Total Protein, Urine: 46.6 mg/dL
Total Volume: 1600

## 2021-04-28 NOTE — Telephone Encounter (Signed)
Transition Care Management Unsuccessful Follow-up Telephone Call  Date of discharge and from where:  04/26/2021 [facility]  Attempts:  2nd Attempt  Reason for unsuccessful TCM follow-up call:  Unable to leave message

## 2021-04-28 NOTE — Telephone Encounter (Signed)
Transition Care Management Unsuccessful Follow-up Telephone Call  Date of discharge and from where:  04/26/21 from [facility]  Attempts:  2nd Attempt  Reason for unsuccessful TCM follow-up call:  Unable to leave message; mailbox not set up

## 2021-04-29 ENCOUNTER — Telehealth (HOSPITAL_COMMUNITY): Payer: Self-pay

## 2021-04-29 NOTE — Telephone Encounter (Signed)
Called and was not able to leave a voice message  to confirm/remind patient of [facility]30[facility].

## 2021-04-30 ENCOUNTER — Ambulatory Visit (HOSPITAL_COMMUNITY): Admit: 2021-04-30 | Payer: Medicare Other

## 2021-04-30 ENCOUNTER — Other Ambulatory Visit: Payer: Self-pay

## 2021-06-01 ENCOUNTER — Telehealth: Payer: Self-pay | Admitting: Family Medicine

## 2021-06-01 NOTE — Telephone Encounter (Signed)
Left message for patient to call back and schedule Medicare Annual Wellness Visit (AWV) either virtually or in office. Left  my jabber number [phone]    AWV-I per [place] 04/24/2019  please schedule at anytime with [facility] Nurse Health Advisor 1 or 2   This should be a 45 minute visit.  

## 2021-07-12 ENCOUNTER — Other Ambulatory Visit: Payer: Self-pay

## 2021-07-12 ENCOUNTER — Inpatient Hospital Stay (HOSPITAL_COMMUNITY)
Admission: EM | Admit: 2021-07-12 | Discharge: 2021-07-15 | DRG: 291 | Disposition: A | Discharge status: Home Health | Payer: Medicare Other | Attending: Internal Medicine | Admitting: Internal Medicine

## 2021-07-12 ENCOUNTER — Emergency Department (HOSPITAL_COMMUNITY): Payer: Medicare Other

## 2021-07-12 ENCOUNTER — Encounter (HOSPITAL_COMMUNITY): Payer: Self-pay | Admitting: Emergency Medicine

## 2021-07-12 DIAGNOSIS — Z7984 Long term (current) use of oral hypoglycemic drugs: Secondary | ICD-10-CM

## 2021-07-12 DIAGNOSIS — Z20822 Contact with and (suspected) exposure to covid-19: Secondary | ICD-10-CM | POA: Diagnosis not present

## 2021-07-12 DIAGNOSIS — E785 Hyperlipidemia, unspecified: Secondary | ICD-10-CM | POA: Diagnosis present

## 2021-07-12 DIAGNOSIS — E1159 Type 2 diabetes mellitus with other circulatory complications: Secondary | ICD-10-CM | POA: Diagnosis present

## 2021-07-12 DIAGNOSIS — E1169 Type 2 diabetes mellitus with other specified complication: Secondary | ICD-10-CM | POA: Diagnosis not present

## 2021-07-12 DIAGNOSIS — I130 Hypertensive heart and chronic kidney disease with heart failure and stage 1 through stage 4 chronic kidney disease, or unspecified chronic kidney disease: Principal | ICD-10-CM | POA: Diagnosis present

## 2021-07-12 DIAGNOSIS — Z8673 Personal history of transient ischemic attack (TIA), and cerebral infarction without residual deficits: Secondary | ICD-10-CM | POA: Diagnosis not present

## 2021-07-12 DIAGNOSIS — R609 Edema, unspecified: Secondary | ICD-10-CM | POA: Diagnosis not present

## 2021-07-12 DIAGNOSIS — Z79899 Other long term (current) drug therapy: Secondary | ICD-10-CM | POA: Diagnosis not present

## 2021-07-12 DIAGNOSIS — I248 Other forms of acute ischemic heart disease: Secondary | ICD-10-CM | POA: Diagnosis not present

## 2021-07-12 DIAGNOSIS — I517 Cardiomegaly: Secondary | ICD-10-CM | POA: Diagnosis not present

## 2021-07-12 DIAGNOSIS — I10 Essential (primary) hypertension: Secondary | ICD-10-CM

## 2021-07-12 DIAGNOSIS — I5020 Unspecified systolic (congestive) heart failure: Secondary | ICD-10-CM

## 2021-07-12 DIAGNOSIS — Z7902 Long term (current) use of antithrombotics/antiplatelets: Secondary | ICD-10-CM

## 2021-07-12 DIAGNOSIS — R197 Diarrhea, unspecified: Secondary | ICD-10-CM | POA: Diagnosis not present

## 2021-07-12 DIAGNOSIS — I152 Hypertension secondary to endocrine disorders: Secondary | ICD-10-CM | POA: Diagnosis present

## 2021-07-12 DIAGNOSIS — I5043 Acute on chronic combined systolic (congestive) and diastolic (congestive) heart failure: Secondary | ICD-10-CM | POA: Diagnosis not present

## 2021-07-12 DIAGNOSIS — I252 Old myocardial infarction: Secondary | ICD-10-CM | POA: Diagnosis not present

## 2021-07-12 DIAGNOSIS — E1140 Type 2 diabetes mellitus with diabetic neuropathy, unspecified: Secondary | ICD-10-CM | POA: Diagnosis not present

## 2021-07-12 DIAGNOSIS — R0602 Shortness of breath: Secondary | ICD-10-CM

## 2021-07-12 DIAGNOSIS — E1122 Type 2 diabetes mellitus with diabetic chronic kidney disease: Secondary | ICD-10-CM | POA: Diagnosis not present

## 2021-07-12 DIAGNOSIS — Z87891 Personal history of nicotine dependence: Secondary | ICD-10-CM | POA: Diagnosis not present

## 2021-07-12 DIAGNOSIS — R7989 Other specified abnormal findings of blood chemistry: Secondary | ICD-10-CM | POA: Diagnosis present

## 2021-07-12 DIAGNOSIS — Z7982 Long term (current) use of aspirin: Secondary | ICD-10-CM | POA: Diagnosis not present

## 2021-07-12 DIAGNOSIS — Z9114 Patient's other noncompliance with medication regimen: Secondary | ICD-10-CM

## 2021-07-12 DIAGNOSIS — I2510 Atherosclerotic heart disease of native coronary artery without angina pectoris: Secondary | ICD-10-CM | POA: Diagnosis not present

## 2021-07-12 DIAGNOSIS — E875 Hyperkalemia: Secondary | ICD-10-CM | POA: Diagnosis not present

## 2021-07-12 DIAGNOSIS — Z91030 Bee allergy status: Secondary | ICD-10-CM

## 2021-07-12 DIAGNOSIS — N1831 Chronic kidney disease, stage 3a: Secondary | ICD-10-CM | POA: Diagnosis present

## 2021-07-12 DIAGNOSIS — N182 Chronic kidney disease, stage 2 (mild): Secondary | ICD-10-CM | POA: Diagnosis present

## 2021-07-12 DIAGNOSIS — I110 Hypertensive heart disease with heart failure: Secondary | ICD-10-CM | POA: Diagnosis not present

## 2021-07-12 DIAGNOSIS — R072 Precordial pain: Secondary | ICD-10-CM

## 2021-07-12 LAB — TROPONIN I (HIGH SENSITIVITY)
Troponin I (High Sensitivity): 18 ng/L — ABNORMAL HIGH (ref <18)
Troponin I (High Sensitivity): 19 ng/L — ABNORMAL HIGH (ref <18)

## 2021-07-12 LAB — CBC
HCT: 39.1 % (ref 39.0–52.0)
Hemoglobin: 12.3 g/dL — ABNORMAL LOW (ref 13.0–17.0)
MCH: 27.2 pg (ref 26.0–34.0)
MCHC: 31.5 g/dL (ref 30.0–36.0)
MCV: 86.5 fL (ref 80.0–100.0)
Platelets: 323 10*3/uL (ref 150–400)
RBC: 4.52 MIL/uL (ref 4.22–5.81)
RDW: 19.8 % — ABNORMAL HIGH (ref 11.5–15.5)
WBC: 6.6 10*3/uL (ref 4.0–10.5)
nRBC: 0 % (ref 0.0–0.2)

## 2021-07-12 LAB — RESP PANEL BY RT-PCR (FLU A&B, COVID) ARPGX2
Influenza A by PCR: NEGATIVE
Influenza B by PCR: NEGATIVE
SARS Coronavirus 2 by RT PCR: NEGATIVE

## 2021-07-12 LAB — COMPREHENSIVE METABOLIC PANEL
ALT: 66 U/L — ABNORMAL HIGH (ref 0–44)
AST: 61 U/L — ABNORMAL HIGH (ref 15–41)
Albumin: 3.2 g/dL — ABNORMAL LOW (ref 3.5–5.0)
Alkaline Phosphatase: 167 U/L — ABNORMAL HIGH (ref 38–126)
Anion gap: 10 (ref 5–15)
BUN: 18 mg/dL (ref 6–20)
CO2: 20 mmol/L — ABNORMAL LOW (ref 22–32)
Calcium: 8.1 mg/dL — ABNORMAL LOW (ref 8.9–10.3)
Chloride: 107 mmol/L (ref 98–111)
Creatinine, Ser: 1.4 mg/dL — ABNORMAL HIGH (ref 0.61–1.24)
GFR, Estimated: 58 mL/min — ABNORMAL LOW (ref >60)
Glucose, Bld: 174 mg/dL — ABNORMAL HIGH (ref 70–99)
Potassium: 6.1 mmol/L — ABNORMAL HIGH (ref 3.5–5.1)
Sodium: 137 mmol/L (ref 135–145)
Total Bilirubin: 2.4 mg/dL — ABNORMAL HIGH (ref 0.3–1.2)
Total Protein: 6.3 g/dL — ABNORMAL LOW (ref 6.5–8.1)

## 2021-07-12 LAB — BRAIN NATRIURETIC PEPTIDE: B Natriuretic Peptide: 2927.8 pg/mL — ABNORMAL HIGH (ref 0.0–100.0)

## 2021-07-12 MED ORDER — SODIUM BICARBONATE 8.4 % IV SOLN
50.0000 meq | Freq: Once | INTRAVENOUS | Status: AC
  Administered 2021-07-12: 50 meq via INTRAVENOUS
  Filled 2021-07-12: qty 50

## 2021-07-12 MED ORDER — NITROGLYCERIN IN D5W 200-5 MCG/ML-% IV SOLN
0.0000 ug/min | INTRAVENOUS | Status: DC
  Administered 2021-07-12: 5 ug/min via INTRAVENOUS
  Filled 2021-07-12: qty 250

## 2021-07-12 MED ORDER — CALCIUM GLUCONATE 10 % IV SOLN
1.0000 g | Freq: Once | INTRAVENOUS | Status: AC
  Administered 2021-07-12: 1 g via INTRAVENOUS
  Filled 2021-07-12: qty 10

## 2021-07-12 MED ORDER — NITROGLYCERIN 2 % TD OINT
1.0000 [in_us] | TOPICAL_OINTMENT | Freq: Once | TRANSDERMAL | Status: AC
  Administered 2021-07-12: 1 [in_us] via TOPICAL
  Filled 2021-07-12: qty 1

## 2021-07-12 MED ORDER — ISOSORBIDE MONONITRATE ER 30 MG PO TB24
30.0000 mg | ORAL_TABLET | Freq: Once | ORAL | Status: AC
  Administered 2021-07-12: 30 mg via ORAL
  Filled 2021-07-12: qty 1

## 2021-07-12 MED ORDER — HYDRALAZINE HCL 25 MG PO TABS
50.0000 mg | ORAL_TABLET | Freq: Once | ORAL | Status: AC
  Administered 2021-07-12: 50 mg via ORAL
  Filled 2021-07-12: qty 2

## 2021-07-12 MED ORDER — CARVEDILOL 12.5 MG PO TABS
25.0000 mg | ORAL_TABLET | Freq: Once | ORAL | Status: AC
  Administered 2021-07-12: 25 mg via ORAL
  Filled 2021-07-12: qty 2

## 2021-07-12 MED ORDER — DEXTROSE 50 % IV SOLN
25.0000 g | Freq: Once | INTRAVENOUS | Status: AC
  Administered 2021-07-12: 25 g via INTRAVENOUS
  Filled 2021-07-12: qty 50

## 2021-07-12 MED ORDER — SODIUM ZIRCONIUM CYCLOSILICATE 10 G PO PACK
10.0000 g | PACK | Freq: Once | ORAL | Status: AC
  Administered 2021-07-12: 10 g via ORAL
  Filled 2021-07-12: qty 1

## 2021-07-12 MED ORDER — NITROGLYCERIN 0.4 MG SL SUBL
0.4000 mg | SUBLINGUAL_TABLET | Freq: Once | SUBLINGUAL | Status: AC
  Administered 2021-07-12: 0.4 mg via SUBLINGUAL
  Filled 2021-07-12: qty 1

## 2021-07-12 MED ORDER — FUROSEMIDE 10 MG/ML IJ SOLN
40.0000 mg | Freq: Once | INTRAMUSCULAR | Status: AC
  Administered 2021-07-12: 40 mg via INTRAVENOUS
  Filled 2021-07-12: qty 4

## 2021-07-12 NOTE — ED Provider Notes (Addendum)
[facility] EMERGENCY DEPARTMENT Provider Note   CSN: [id number] Arrival date & time: 07/12/21  2056     History  Chief Complaint  Patient presents with   Chest Pain   Shortness of Breath    Thomas West is a 60 y.o. male.  Patient with hx chf, htn, indicates sob and chest tightness in past few days. Symptoms acute onset, moderate, constant, persistent, at rest, not pleuritic. +bilateral leg swelling. No calf pain. No cough or uri symptoms. No fever or chills. States ran out of meds last week.   The history is provided by the patient and medical records.      Home Medications Prior to Admission medications   Medication Sig Start Date End Date Taking? Authorizing Provider  aspirin 81 MG EC tablet Take 1 tablet (81 mg total) by mouth daily. 04/24/21   Ghimire, Henreitta Leber, MD  atorvastatin (LIPITOR) 80 MG tablet Take 1 tablet (80 mg total) by mouth daily. 04/24/21   Ghimire, Henreitta Leber, MD  carvedilol (COREG) 6.25 MG tablet Take 1 tablet (6.25 mg total) by mouth EVERY 12 HOURS (10AM &10PM) 04/24/21   Ghimire, Henreitta Leber, MD  clopidogrel (PLAVIX) 75 MG tablet Take 1 tablet (75 mg total) by mouth daily. 04/24/21   Ghimire, Henreitta Leber, MD  hydrALAZINE (APRESOLINE) 50 MG tablet Take 1 tablet (50 mg total) by mouth 3 (three) times daily. 04/24/21   Ghimire, Henreitta Leber, MD  isosorbide mononitrate (IMDUR) 30 MG 24 hr tablet Take 1 tablet (30 mg total) by mouth daily. 04/24/21   Ghimire, Henreitta Leber, MD  metFORMIN (GLUCOPHAGE) 500 MG tablet Take 1 tablet (500 mg total) by mouth 2 (two) times daily with a meal. 04/24/21 04/24/22  Ghimire, Henreitta Leber, MD  polyethylene glycol powder (GLYCOLAX/MIRALAX) 17 GM/SCOOP powder Take 1 capful (17 g) with water by mouth daily. 04/24/21   Ghimire, Henreitta Leber, MD  sacubitril-valsartan (ENTRESTO) 24-26 MG Take 1 tablet by mouth 2 (two) times daily. 04/24/21   Ghimire, Henreitta Leber, MD  sitaGLIPtin (JANUVIA) 25 MG tablet Take 1 tablet (25 mg total) by mouth  daily. 04/24/21   Ghimire, Henreitta Leber, MD  spironolactone (ALDACTONE) 25 MG tablet Take 1 tablet (25 mg total) by mouth daily. 04/25/21   Ghimire, Henreitta Leber, MD      Allergies    Bee venom    Review of Systems   Review of Systems  Constitutional:  Negative for chills and fever.  HENT:  Negative for sore throat.   Eyes:  Negative for redness.  Respiratory:  Positive for shortness of breath. Negative for cough.   Cardiovascular:  Positive for chest pain and leg swelling.  Gastrointestinal:  Negative for abdominal pain, nausea and vomiting.  Genitourinary:  Negative for dysuria and flank pain.  Musculoskeletal:  Negative for back pain and neck pain.  Skin:  Negative for rash.  Neurological:  Negative for headaches.  Hematological:  Does not bruise/bleed easily.  Psychiatric/Behavioral:  Negative for confusion.    Physical Exam Updated Vital Signs BP (!) 162/127    Pulse 97    Temp 98.6 F (37 C) (Oral)    Resp (!) 22    Ht 1.753 m (5\' 9" )    Wt 68 kg    SpO2 100%    BMI 22.15 kg/m  Physical Exam Vitals and nursing note reviewed.  Constitutional:      Appearance: Normal appearance. He is well-developed.  HENT:     Head: Atraumatic.  Nose: Nose normal.     Mouth/Throat:     Mouth: Mucous membranes are moist.     Pharynx: Oropharynx is clear.  Eyes:     General: No scleral icterus.    Conjunctiva/sclera: Conjunctivae normal.  Neck:     Trachea: No tracheal deviation.  Cardiovascular:     Rate and Rhythm: Normal rate and regular rhythm.     Pulses: Normal pulses.     Heart sounds: Normal heart sounds. No murmur heard.   No friction rub. No gallop.  Pulmonary:     Effort: Pulmonary effort is normal. No accessory muscle usage or respiratory distress.     Breath sounds: Rales present.     Comments: Rales bases.  Abdominal:     General: Bowel sounds are normal. There is no distension.     Palpations: Abdomen is soft.     Tenderness: There is no abdominal tenderness. There  is no guarding.  Genitourinary:    Comments: No cva tenderness. Musculoskeletal:        General: No tenderness.     Cervical back: Normal range of motion and neck supple. No rigidity.     Right lower leg: Edema present.     Left lower leg: Edema present.     Comments: Symmetric bilateral leg edema.   Skin:    General: Skin is warm and dry.     Findings: No rash.  Neurological:     Mental Status: He is alert.     Comments: Alert, speech clear.   Psychiatric:        Mood and Affect: Mood normal.    ED Results / Procedures / Treatments   Labs (all labs ordered are listed, but only abnormal results are displayed) Results for orders placed or performed during the hospital encounter of 07/12/21  CBC  Result Value Ref Range   WBC 6.6 4.0 - 10.5 K/uL   RBC 4.52 4.22 - 5.81 MIL/uL   Hemoglobin 12.3 (L) 13.0 - 17.0 g/dL   HCT 39.1 39.0 - 52.0 %   MCV 86.5 80.0 - 100.0 fL   MCH 27.2 26.0 - 34.0 pg   MCHC 31.5 30.0 - 36.0 g/dL   RDW 19.8 (H) 11.5 - 15.5 %   Platelets 323 150 - 400 K/uL   nRBC 0.0 0.0 - 0.2 %  Comprehensive metabolic panel  Result Value Ref Range   Sodium 137 135 - 145 mmol/L   Potassium 6.1 (H) 3.5 - 5.1 mmol/L   Chloride 107 98 - 111 mmol/L   CO2 20 (L) 22 - 32 mmol/L   Glucose, Bld 174 (H) 70 - 99 mg/dL   BUN 18 6 - 20 mg/dL   Creatinine, Ser 1.40 (H) 0.61 - 1.24 mg/dL   Calcium 8.1 (L) 8.9 - 10.3 mg/dL   Total Protein 6.3 (L) 6.5 - 8.1 g/dL   Albumin 3.2 (L) 3.5 - 5.0 g/dL   AST 61 (H) 15 - 41 U/L   ALT 66 (H) 0 - 44 U/L   Alkaline Phosphatase 167 (H) 38 - 126 U/L   Total Bilirubin 2.4 (H) 0.3 - 1.2 mg/dL   GFR, Estimated 58 (L) >60 mL/min   Anion gap 10 5 - 15  Brain natriuretic peptide  Result Value Ref Range   B Natriuretic Peptide 2,927.8 (H) 0.0 - 100.0 pg/mL  Troponin I (High Sensitivity)  Result Value Ref Range   Troponin I (High Sensitivity) 18 (H) <18 ng/L      EKG  EKG Interpretation  Date/Time:  Sunday July 12 2021 21:03:36  EST Ventricular Rate:  98 PR Interval:  182 QRS Duration: 89 QT Interval:  344 QTC Calculation: 440 R Axis:   102 Text Interpretation: Sinus rhythm Right axis deviation Nonspecific T wave abnormality Confirmed by Lajean Saver [phone]) on 07/12/2021 9:05:56 PM  Radiology DG Chest Port 1 View  Result Date: 07/12/2021 CLINICAL DATA:  Shortness of breath. EXAM: PORTABLE CHEST 1 VIEW COMPARISON:  Radiograph 04/20/2021 FINDINGS: Unchanged cardiomegaly. Stable mediastinal contours. Chronic right infrahilar atelectasis or scarring. No acute airspace disease. No pleural effusion or pneumothorax. No pulmonary edema. No acute osseous abnormalities. IMPRESSION: Stable cardiomegaly. No acute pulmonary process. Electronically Signed   By: Keith Rake M.D.   On: 07/12/2021 22:05    Procedures Procedures    Medications Ordered in ED Medications  nitroGLYCERIN 50 mg in dextrose 5 % 250 mL (0.2 mg/mL) infusion (35 mcg/min Intravenous Rate/Dose Change 07/12/21 2235)  calcium gluconate inj 10% (1 g) URGENT USE ONLY! (has no administration in time range)  sodium bicarbonate injection 50 mEq (has no administration in time range)  dextrose 50 % solution 25 g (has no administration in time range)  sodium zirconium cyclosilicate (LOKELMA) packet 10 g (has no administration in time range)  furosemide (LASIX) injection 40 mg (has no administration in time range)  nitroGLYCERIN (NITROSTAT) SL tablet 0.4 mg (0.4 mg Sublingual Given 07/12/21 2207)  hydrALAZINE (APRESOLINE) tablet 50 mg (50 mg Oral Given 07/12/21 2220)    ED Course/ Medical Decision Making/ A&P                           Medical Decision Making Problems Addressed: Acute on chronic combined systolic and diastolic CHF (congestive heart failure) ([place]): chronic illness or injury with exacerbation, progression, or side effects of treatment that poses a threat to life or bodily functions Hyperkalemia: acute illness or injury that poses a threat to  life or bodily functions Precordial chest pain: acute illness or injury that poses a threat to life or bodily functions Shortness of breath: acute illness or injury Uncontrolled hypertension: acute illness or injury that poses a threat to life or bodily functions  Amount and/or Complexity of Data Reviewed External Data Reviewed: ECG and notes. Labs: ordered. Decision-making details documented in ED Course. Radiology: ordered and independent interpretation performed. Decision-making details documented in ED Course. ECG/medicine tests: ordered and independent interpretation performed. Decision-making details documented in ED Course. Discussion of management or test interpretation with external provider(s): Hospitalist consulted for admission.  Risk Prescription drug management. Drug therapy requiring intensive monitoring for toxicity. Decision regarding hospitalization.   Iv ns. Continuous pulse ox and cardiac monitoring. Stat labs. Imaging. Pcxr. Ecg.   Reviewed nursing notes and prior charts for additional history. External reports reviewed.   Labs reviewed/interpreted by me - bnp very high. K high. D50 iv, hco3 iv, ca gluc iv, lokelma iv. Repeat k pending.  For chf, ntg gtt, lasix iv.   CXR reviewed/interpreted by me - vascular congestion.   Cardiac monitor: sinus rhythm, rate 94.  Ntg sl as bp high. Ntg gtt.   Pt also given dose of his hydralazine in ED.   Hospitalists consulted for admission.  CRITICAL CARE RE: severe uncontrolled htn, with acute on chronic chf, hyperkalemia.  Performed by: Mirna Mires Total critical care time: 45 minutes Critical care time was exclusive of separately billable procedures and treating other patients. Critical care was necessary to  treat or prevent imminent or life-threatening deterioration. Critical care was time spent personally by me on the following activities: development of treatment plan with patient and/or surrogate as well as  nursing, discussions with consultants, evaluation of patient's response to treatment, examination of patient, obtaining history from patient or surrogate, ordering and performing treatments and interventions, ordering and review of laboratory studies, ordering and review of radiographic studies, pulse oximetry and re-evaluation of patient's condition.  Recheck pt appears improved, urinating. Bp mildly improved. Will transition off ntg gtt, to imdur, nitropaste, and give dose of his bp meds that he's missed. Discussed with Hospitalist, [name] - will admit.        Final Clinical Impression(s) / ED Diagnoses Final diagnoses:  None    Rx / DC Orders ED Discharge Orders     None          Lajean Saver, MD 07/12/21 2345

## 2021-07-12 NOTE — H&P (Signed)
History and Physical    Thomas West KWI:[id number] DOB: 1961/02/11 DOA: 07/12/2021  PCP: [name], [name], MD  Patient coming from: Home  I have personally briefly reviewed patient's old medical records in [place]  Chief Complaint: Shortness of breath  HPI: Thomas West is a 61 y.o. 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  Patient states he ran out of his medications about 1 week ago.  Over the last 3-4 days he has been having significant exertional dyspnea, has been unable to climb up a flight of stairs at his home without assistance from his son.  He has had occasional nonproductive cough.  He has had increased swelling to his lower extremities.  He has been experiencing central chest tightness without radiation.  He has had some indigestion and nausea which she attributes to eating franks and beans.  He says he quit smoking about 4-5 years ago.  He says he rarely uses alcohol on holidays.  He denies any illicit drug use.  ED Course   Labs/Imaging on admission: I have personally reviewed following labs and imaging studies.  Initial vitals showed BP 185/121, pulse 101, RR 35, temp 98.6 F, SPO2 98% on room air.  Labs show sodium 137, potassium 6.1, bicarb 20, BUN 18, creatinine 1.40, serum glucose 174, AST 61, ALT 66, alk phos 167, total bilirubin 2.4, BNP 2927.8, WBC 6.6, hemoglobin 12.3, platelets 323,000, high-sensitivity troponin 18 > 19.  SARS-CoV-2 PCR and influenza negative.  Portable chest x-ray shows cardiomegaly without focal consolidation or effusion.  Patient was given IV Lasix 40 mg, sublingual nitroglycerin, oral hydralazine 50 mg, Lokelma 10 g orally, 1 g IV calcium  gluconate, 1 amp sodium bicarb, amp D50.  He was placed on nitroglycerin drip temporarily which has been subsequently discontinued.  He was given doses of oral Coreg 25 mg, Imdur 30 mg, and topical nitroglycerin ointment.  The hospitalist service was consulted to admit for further evaluation and management.  Review of Systems: All systems reviewed and are negative except as documented in history of present illness above.   Past Medical History:  Diagnosis Date   CHF (congestive heart failure) (HCC)    Coronary artery disease    Diabetes mellitus without complication ([place])    History of MI (myocardial infarction) 03/05/2019   Hypertension    Stroke [facility])     Past Surgical History:  Procedure Laterality Date   leg surgery     "pins in left shin"   RIGHT/LEFT HEART CATH AND CORONARY ANGIOGRAPHY N/A 03/11/2021   Procedure: RIGHT/LEFT HEART CATH AND CORONARY ANGIOGRAPHY;  Surgeon: Early Osmond, MD;  Location: [facility];  Service: Cardiovascular;  Laterality: N/A;    Social History:  reports that he quit smoking about 3 years ago. His smoking use included cigarettes. He has never used smokeless tobacco. He reports that he does not currently use alcohol. He reports current drug use. Drug: Marijuana.  Allergies  Allergen Reactions   Bee Venom     swelling    History reviewed. No pertinent family history.   Prior to Admission medications   Medication Sig Start Date End Date Taking? Authorizing Provider  aspirin 81 MG EC tablet Take 1 tablet (81 mg total) by mouth  daily. 04/24/21  Yes Ghimire, Henreitta Leber, MD  atorvastatin (LIPITOR) 80 MG tablet Take 1 tablet (80 mg total) by mouth daily. 04/24/21  Yes Ghimire, Henreitta Leber, MD  carvedilol (COREG) 25 MG tablet Take 25 mg by mouth 2 (two) times daily. 05/30/21  Yes [provider]  clopidogrel (PLAVIX) 75 MG tablet Take 1 tablet (75 mg total) by mouth daily. 04/24/21  Yes Ghimire, Henreitta Leber, MD  hydrALAZINE (APRESOLINE) 50  MG tablet Take 1 tablet (50 mg total) by mouth 3 (three) times daily. 04/24/21  Yes Ghimire, Henreitta Leber, MD  ibuprofen (ADVIL) 200 MG tablet Take 400 mg by mouth every 6 (six) hours as needed for headache or moderate pain.   Yes [provider]  isosorbide mononitrate (IMDUR) 30 MG 24 hr tablet Take 1 tablet (30 mg total) by mouth daily. 04/24/21  Yes Ghimire, Henreitta Leber, MD  sacubitril-valsartan (ENTRESTO) 24-26 MG Take 1 tablet by mouth 2 (two) times daily. 04/24/21  Yes Ghimire, Henreitta Leber, MD  spironolactone (ALDACTONE) 25 MG tablet Take 1 tablet (25 mg total) by mouth daily. 04/25/21  Yes Ghimire, Henreitta Leber, MD  carvedilol (COREG) 6.25 MG tablet Take 1 tablet (6.25 mg total) by mouth EVERY 12 HOURS (10AM &10PM) Patient not taking: Reported on 07/12/2021 04/24/21   Jonetta Osgood, MD  metFORMIN (GLUCOPHAGE) 500 MG tablet Take 1 tablet (500 mg total) by mouth 2 (two) times daily with a meal. Patient not taking: Reported on 07/12/2021 04/24/21 04/24/22  Jonetta Osgood, MD  polyethylene glycol powder (GLYCOLAX/MIRALAX) 17 GM/SCOOP powder Take 1 capful (17 g) with water by mouth daily. Patient not taking: Reported on 07/12/2021 04/24/21   Jonetta Osgood, MD  sitaGLIPtin (JANUVIA) 25 MG tablet Take 1 tablet (25 mg total) by mouth daily. Patient not taking: Reported on 07/12/2021 04/24/21   Jonetta Osgood, MD    Physical Exam: Vitals:   07/12/21 2226 07/12/21 2230 07/12/21 2245 07/12/21 2300  BP:  (!) 162/127 (!) 167/124 (!) 164/125  Pulse:    90  Resp:  (!) 22 (!) 28 (!) 24  Temp:      TempSrc:      SpO2:    100%  Weight: 68 kg     Height: _0  (1.753 m)      Constitutional: Sitting up on the side of the bed, NAD, calm, comfortable Eyes: PERRL, lids and conjunctivae normal ENMT: Mucous membranes are moist. Posterior pharynx clear of any exudate or lesions.Normal dentition.  Neck: normal, supple, no masses. Respiratory: Bibasilar crackles. Normal respiratory effort. No  accessory muscle use.  Cardiovascular: Regular rate and rhythm, no murmurs / rubs / gallops.  +1 bilateral lower extremity edema. 2+ pedal pulses. Abdomen: no tenderness, no masses palpated. Musculoskeletal: no clubbing / cyanosis. No joint deformity upper and lower extremities. Good ROM, no contractures. Normal muscle tone.  Skin: no rashes, lesions, ulcers. No induration Neurologic: CN 2-12 grossly intact. Sensation intact. Strength 5/5 in all 4.  Psychiatric: Normal judgment and insight. Alert and oriented x 3. Normal mood.   EKG: Personally reviewed. Sinus rhythm, RAD, T wave inversion V5-V6.  Similar to prior.  Assessment/Plan Principal Problem:   Acute on chronic combined systolic and diastolic CHF (congestive heart failure) (HCC) Active Problems:   Hypertension associated with diabetes (HCC)   Hyperkalemia   CAD (coronary artery disease)   Chronic kidney disease, stage 3a (HCC)   Type 2 diabetes mellitus with diabetic neuropathy, unspecified ([place])   Hyperlipidemia associated with  type 2 diabetes mellitus ([place])   Thomas West is a 61 y.o. 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  Assessment and Plan: * Acute on chronic combined systolic and diastolic CHF (congestive heart failure) ([place])- (present on admission) Presenting with progressive exertional dyspnea, lower extremity edema, elevated BNP in setting of medication nonadherence.  TTE 03/09/2021 showed EF 20-25% with G2DD.  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 -Continue IV Lasix 60 mg twice daily -Strict I/O's and daily weights -Restart home meds: Coreg 6.25 mg BID, hydralazine  50 mg TID, Imdur 30 mg daily, Entresto 24-26 mg BID, spironolactone 25 mg daily  Hypertension associated with diabetes ([place])- (present on admission) Significantly hypertensive on arrival in setting of medication nonadherence. -Resume home meds and continue diuresis as above -Topical nitroglycerin  Hyperkalemia- (present on admission) Initial potassium 6.1, repeat 4.2 after oral Lokelma.  Unclear accuracy of initial potassium as he had not been taking Entresto or spironolactone for about 1 week. -Continue to monitor with resumption of spironolactone and Entresto  CAD (coronary artery disease)- (present on admission) Mild obstructive CAD noted on right/left heart cath 03/11/2021 with widely patent previously placed RPAV stent.  Patient has been having some chest tightness.  Troponins are 18 and 19.  EKG not significantly changed from prior.  Symptoms likely related to acute on chronic CHF and accelerated hypertension. -Continue aspirin/Plavix -Continue atorvastatin -Continue Imdur, Coreg  Chronic kidney disease, stage 3a ([place])- (present on admission) Creatinine 1.40 on admission, at baseline.  Continue to monitor.  Type 2 diabetes mellitus with diabetic neuropathy, unspecified ([place])- (present on admission) Hold metformin, place on SSI.  Hyperlipidemia associated with type 2 diabetes mellitus ([place])- (present on admission) Continue atorvastatin 80 mg daily.  DVT prophylaxis: Subcutaneous heparin Code Status: Full code Family Communication: Discussed with patient, he has discussed with family Disposition Plan: From home, dispo pending clinical progress Consults called: None Severity of Illness: The appropriate patient status for this patient is OBSERVATION. Observation status is judged to be reasonable and necessary in order to provide the required intensity of service to ensure the patient's safety. The patient's presenting symptoms, physical exam findings, and initial radiographic and  laboratory data in the context of their medical condition is felt to place them at decreased risk for further clinical deterioration. Furthermore, it is anticipated that the patient will be medically stable for discharge from the hospital within 2 midnights of admission.   Zada Finders MD [facility]  If 7PM-7AM, please contact night-coverage [email or web address]  07/13/2021, 12:32 AM

## 2021-07-13 DIAGNOSIS — Z8673 Personal history of transient ischemic attack (TIA), and cerebral infarction without residual deficits: Secondary | ICD-10-CM | POA: Diagnosis not present

## 2021-07-13 DIAGNOSIS — I252 Old myocardial infarction: Secondary | ICD-10-CM | POA: Diagnosis not present

## 2021-07-13 DIAGNOSIS — I2510 Atherosclerotic heart disease of native coronary artery without angina pectoris: Secondary | ICD-10-CM | POA: Diagnosis present

## 2021-07-13 DIAGNOSIS — I130 Hypertensive heart and chronic kidney disease with heart failure and stage 1 through stage 4 chronic kidney disease, or unspecified chronic kidney disease: Secondary | ICD-10-CM | POA: Diagnosis present

## 2021-07-13 DIAGNOSIS — I248 Other forms of acute ischemic heart disease: Secondary | ICD-10-CM | POA: Diagnosis present

## 2021-07-13 DIAGNOSIS — E1122 Type 2 diabetes mellitus with diabetic chronic kidney disease: Secondary | ICD-10-CM | POA: Diagnosis present

## 2021-07-13 DIAGNOSIS — Z9114 Patient's other noncompliance with medication regimen: Secondary | ICD-10-CM | POA: Diagnosis not present

## 2021-07-13 DIAGNOSIS — I5043 Acute on chronic combined systolic (congestive) and diastolic (congestive) heart failure: Secondary | ICD-10-CM | POA: Diagnosis present

## 2021-07-13 DIAGNOSIS — Z91030 Bee allergy status: Secondary | ICD-10-CM | POA: Diagnosis not present

## 2021-07-13 DIAGNOSIS — Z7902 Long term (current) use of antithrombotics/antiplatelets: Secondary | ICD-10-CM | POA: Diagnosis not present

## 2021-07-13 DIAGNOSIS — E1140 Type 2 diabetes mellitus with diabetic neuropathy, unspecified: Secondary | ICD-10-CM | POA: Diagnosis present

## 2021-07-13 DIAGNOSIS — Z7984 Long term (current) use of oral hypoglycemic drugs: Secondary | ICD-10-CM | POA: Diagnosis not present

## 2021-07-13 DIAGNOSIS — Z87891 Personal history of nicotine dependence: Secondary | ICD-10-CM | POA: Diagnosis not present

## 2021-07-13 DIAGNOSIS — Z20822 Contact with and (suspected) exposure to covid-19: Secondary | ICD-10-CM | POA: Diagnosis present

## 2021-07-13 DIAGNOSIS — E1169 Type 2 diabetes mellitus with other specified complication: Secondary | ICD-10-CM | POA: Diagnosis present

## 2021-07-13 DIAGNOSIS — N182 Chronic kidney disease, stage 2 (mild): Secondary | ICD-10-CM | POA: Diagnosis present

## 2021-07-13 DIAGNOSIS — E785 Hyperlipidemia, unspecified: Secondary | ICD-10-CM | POA: Diagnosis present

## 2021-07-13 DIAGNOSIS — I152 Hypertension secondary to endocrine disorders: Secondary | ICD-10-CM | POA: Diagnosis present

## 2021-07-13 DIAGNOSIS — Z79899 Other long term (current) drug therapy: Secondary | ICD-10-CM | POA: Diagnosis not present

## 2021-07-13 DIAGNOSIS — E875 Hyperkalemia: Secondary | ICD-10-CM | POA: Diagnosis present

## 2021-07-13 DIAGNOSIS — Z7982 Long term (current) use of aspirin: Secondary | ICD-10-CM | POA: Diagnosis not present

## 2021-07-13 LAB — COMPREHENSIVE METABOLIC PANEL
ALT: 60 U/L — ABNORMAL HIGH (ref 0–44)
AST: 45 U/L — ABNORMAL HIGH (ref 15–41)
Albumin: 3.3 g/dL — ABNORMAL LOW (ref 3.5–5.0)
Alkaline Phosphatase: 181 U/L — ABNORMAL HIGH (ref 38–126)
Anion gap: 11 (ref 5–15)
BUN: 16 mg/dL (ref 6–20)
CO2: 24 mmol/L (ref 22–32)
Calcium: 8.7 mg/dL — ABNORMAL LOW (ref 8.9–10.3)
Chloride: 102 mmol/L (ref 98–111)
Creatinine, Ser: 1.41 mg/dL — ABNORMAL HIGH (ref 0.61–1.24)
GFR, Estimated: 58 mL/min — ABNORMAL LOW (ref >60)
Glucose, Bld: 270 mg/dL — ABNORMAL HIGH (ref 70–99)
Potassium: 4.2 mmol/L (ref 3.5–5.1)
Sodium: 137 mmol/L (ref 135–145)
Total Bilirubin: 1.1 mg/dL (ref 0.3–1.2)
Total Protein: 6.9 g/dL (ref 6.5–8.1)

## 2021-07-13 LAB — MAGNESIUM: Magnesium: 2 mg/dL (ref 1.7–2.4)

## 2021-07-13 LAB — GLUCOSE, CAPILLARY
Glucose-Capillary: 169 mg/dL — ABNORMAL HIGH (ref 70–99)
Glucose-Capillary: 188 mg/dL — ABNORMAL HIGH (ref 70–99)
Glucose-Capillary: 156 mg/dL — ABNORMAL HIGH (ref 70–99)
Glucose-Capillary: 199 mg/dL — ABNORMAL HIGH (ref 70–99)

## 2021-07-13 LAB — POTASSIUM: Potassium: 4.2 mmol/L (ref 3.5–5.1)

## 2021-07-13 MED ORDER — CLOPIDOGREL BISULFATE 75 MG PO TABS
75.0000 mg | ORAL_TABLET | Freq: Every day | ORAL | Status: DC
  Administered 2021-07-13 – 2021-07-15 (×3): 75 mg via ORAL
  Filled 2021-07-13 (×3): qty 1

## 2021-07-13 MED ORDER — SODIUM CHLORIDE 0.9% FLUSH
3.0000 mL | Freq: Two times a day (BID) | INTRAVENOUS | Status: DC
  Administered 2021-07-13 – 2021-07-15 (×6): 3 mL via INTRAVENOUS

## 2021-07-13 MED ORDER — INSULIN ASPART 100 UNIT/ML IJ SOLN
0.0000 [IU] | Freq: Three times a day (TID) | INTRAMUSCULAR | Status: DC
  Administered 2021-07-13 (×3): 2 [IU] via SUBCUTANEOUS
  Administered 2021-07-14: 3 [IU] via SUBCUTANEOUS
  Administered 2021-07-14 – 2021-07-15 (×3): 1 [IU] via SUBCUTANEOUS
  Administered 2021-07-15: 3 [IU] via SUBCUTANEOUS

## 2021-07-13 MED ORDER — ACETAMINOPHEN 650 MG RE SUPP: 650.0000 mg | Freq: Four times a day (QID) | RECTAL | Status: DC | PRN

## 2021-07-13 MED ORDER — ATORVASTATIN CALCIUM 80 MG PO TABS
80.0000 mg | ORAL_TABLET | Freq: Every day | ORAL | Status: DC
  Administered 2021-07-13 – 2021-07-15 (×3): 80 mg via ORAL
  Filled 2021-07-13 (×3): qty 1

## 2021-07-13 MED ORDER — FUROSEMIDE 10 MG/ML IJ SOLN
60.0000 mg | Freq: Two times a day (BID) | INTRAMUSCULAR | Status: DC
  Administered 2021-07-13 – 2021-07-15 (×4): 60 mg via INTRAVENOUS
  Filled 2021-07-13 (×5): qty 6

## 2021-07-13 MED ORDER — ACETAMINOPHEN 325 MG PO TABS
650.0000 mg | ORAL_TABLET | Freq: Four times a day (QID) | ORAL | Status: DC | PRN
  Administered 2021-07-13 (×2): 650 mg via ORAL
  Filled 2021-07-13 (×2): qty 2

## 2021-07-13 MED ORDER — HYDRALAZINE HCL 50 MG PO TABS
50.0000 mg | ORAL_TABLET | Freq: Three times a day (TID) | ORAL | Status: DC
  Administered 2021-07-13 – 2021-07-15 (×7): 50 mg via ORAL
  Filled 2021-07-13 (×7): qty 1

## 2021-07-13 MED ORDER — HEPARIN SODIUM (PORCINE) 5000 UNIT/ML IJ SOLN
5000.0000 [IU] | Freq: Three times a day (TID) | INTRAMUSCULAR | Status: DC
  Administered 2021-07-14: 5000 [IU] via SUBCUTANEOUS
  Filled 2021-07-13 (×5): qty 1

## 2021-07-13 MED ORDER — ONDANSETRON HCL 4 MG PO TABS: 4.0000 mg | ORAL_TABLET | Freq: Four times a day (QID) | ORAL | Status: DC | PRN

## 2021-07-13 MED ORDER — OXYCODONE-ACETAMINOPHEN 5-325 MG PO TABS
1.0000 | ORAL_TABLET | Freq: Four times a day (QID) | ORAL | Status: DC | PRN
  Administered 2021-07-13 – 2021-07-15 (×4): 1 via ORAL
  Filled 2021-07-13 (×4): qty 1

## 2021-07-13 MED ORDER — ONDANSETRON HCL 4 MG/2ML IJ SOLN
4.0000 mg | Freq: Four times a day (QID) | INTRAMUSCULAR | Status: DC | PRN
Start: 2021-07-13 — End: 2021-07-15

## 2021-07-13 MED ORDER — SACUBITRIL-VALSARTAN 24-26 MG PO TABS
1.0000 | ORAL_TABLET | Freq: Two times a day (BID) | ORAL | Status: DC
  Administered 2021-07-13 – 2021-07-15 (×5): 1 via ORAL
  Filled 2021-07-13 (×5): qty 1

## 2021-07-13 MED ORDER — CARVEDILOL 6.25 MG PO TABS
6.2500 mg | ORAL_TABLET | Freq: Two times a day (BID) | ORAL | Status: DC
  Administered 2021-07-13 – 2021-07-15 (×5): 6.25 mg via ORAL
  Filled 2021-07-13 (×5): qty 1

## 2021-07-13 MED ORDER — SENNOSIDES-DOCUSATE SODIUM 8.6-50 MG PO TABS: 1.0000 | ORAL_TABLET | Freq: Every evening | ORAL | Status: DC | PRN

## 2021-07-13 MED ORDER — ASPIRIN EC 81 MG PO TBEC
81.0000 mg | DELAYED_RELEASE_TABLET | Freq: Every day | ORAL | Status: DC
  Administered 2021-07-13 – 2021-07-15 (×3): 81 mg via ORAL
  Filled 2021-07-13 (×3): qty 1

## 2021-07-13 MED ORDER — SPIRONOLACTONE 25 MG PO TABS
25.0000 mg | ORAL_TABLET | Freq: Every day | ORAL | Status: DC
  Administered 2021-07-13 – 2021-07-15 (×3): 25 mg via ORAL
  Filled 2021-07-13 (×3): qty 1

## 2021-07-13 NOTE — Assessment & Plan Note (Signed)
Hold metformin, place on SSI. 

## 2021-07-13 NOTE — Assessment & Plan Note (Signed)
Mild obstructive CAD noted on right/left heart cath 03/11/2021 with widely patent previously placed RPAV stent.  Patient has been having some chest tightness.  Troponins are 18 and 19.  EKG not significantly changed from prior.  Symptoms likely related to acute on chronic CHF and accelerated hypertension. -Continue aspirin/Plavix -Continue atorvastatin -Continue Imdur, Coreg

## 2021-07-13 NOTE — Assessment & Plan Note (Signed)
Creatinine 1.40 on admission, at baseline.  Continue to monitor.

## 2021-07-13 NOTE — Assessment & Plan Note (Signed)
Continue atorvastatin 80 mg daily. 

## 2021-07-13 NOTE — Assessment & Plan Note (Signed)
Significantly hypertensive on arrival in setting of medication nonadherence. -Resume home meds and continue diuresis as above -Topical nitroglycerin

## 2021-07-13 NOTE — Assessment & Plan Note (Signed)
Presenting with progressive exertional dyspnea, lower extremity edema, elevated BNP in setting of medication nonadherence.  TTE 03/09/2021 showed EF 20-25% with G2DD.  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 -Continue IV Lasix 60 mg twice daily -Strict I/O's and daily weights -Restart home meds: Coreg 6.25 mg BID, hydralazine 50 mg TID, Imdur 30 mg daily, Entresto 24-26 mg BID, spironolactone 25 mg daily

## 2021-07-13 NOTE — Evaluation (Signed)
Physical Therapy Evaluation Patient Details Name: Huey Nathe MRN: [id number] DOB: 1960/11/30 Today's Date: 07/13/2021  History of Present Illness  Pt adm 2/19 with acute on chronic combined heart failure. PMH - CHF, CAD, DM II, MI, HTN, stroke, peripheral neuropathy, CKD II, and HLD.  Clinical Impression  Pt presents to PT with slight decr in mobility due to illness and inactivity. Expect pt will make good progress back to baseline with mobility. Will follow acutely but doubt pt will need PT after DC.         Recommendations for follow up therapy are one component of a multi-disciplinary discharge planning process, led by the attending physician.  Recommendations may be updated based on patient status, additional functional criteria and insurance authorization.  Follow Up Recommendations No PT follow up    Assistance Recommended at Discharge PRN  Patient can return home with the following  Help with stairs or ramp for entrance    Equipment Recommendations None recommended by PT  Recommendations for Other Services       Functional Status Assessment Patient has had a recent decline in their functional status and/or demonstrates limited ability to make significant improvements in function in a reasonable and predictable amount of time     Precautions / Restrictions Precautions Precautions: None Restrictions Weight Bearing Restrictions: No      Mobility  Bed Mobility Overal bed mobility: Modified Independent                  Transfers Overall transfer level: Modified independent Equipment used: None                    Ambulation/Gait Ambulation/Gait assistance: Supervision Gait Distance (Feet): 200 Feet Assistive device: None, Rollator (4 wheels) Gait Pattern/deviations: Step-through pattern, Decreased stride length Gait velocity: decr Gait velocity interpretation: 1.31 - 2.62 ft/sec, indicative of limited community ambulator   General Gait Details:  Assist for safety. Pt used rollator in hallway and no device in room.  Stairs            Wheelchair Mobility    Modified Rankin (Stroke Patients Only)       Balance Overall balance assessment: Needs assistance Sitting-balance support: No upper extremity supported, Feet supported Sitting balance-Leahy Scale: Normal     Standing balance support: No upper extremity supported, During functional activity Standing balance-Leahy Scale: Good                               Pertinent Vitals/Pain Pain Assessment Pain Assessment: No/denies pain    Home Living Family/patient expects to be discharged to:: Private residence Living Arrangements: Children (80 year old son) Available Help at Discharge: Family;Available PRN/intermittently Type of Home: House Home Access: Stairs to enter       Home Layout: Two level;Bed/bath upstairs Home Equipment: Rollator (4 wheels);BSC/3in1      Prior Function Prior Level of Function : Needs assist       Physical Assist : Mobility (physical) Mobility (physical): Stairs   Mobility Comments: occasional assist from son to navigate stairs due to SOB       Hand Dominance   Dominant Hand: Right    Extremity/Trunk Assessment   Upper Extremity Assessment Upper Extremity Assessment: Defer to OT evaluation    Lower Extremity Assessment Lower Extremity Assessment: Generalized weakness       Communication   Communication: No difficulties  Cognition Arousal/Alertness: Awake/alert Behavior During Therapy: [facility] for tasks  assessed/performed Overall Cognitive Status: Within Functional Limits for tasks assessed                                          General Comments General comments (skin integrity, edema, etc.): VSS on RA    Exercises     Assessment/Plan    PT Assessment Patient needs continued PT services  PT Problem List Decreased balance;Decreased mobility;Decreased strength       PT Treatment  Interventions DME instruction;Gait training;Stair training;Functional mobility training;Therapeutic activities;Balance training;Patient/family education    PT Goals (Current goals can be found in the Care Plan section)  Acute Rehab PT Goals Patient Stated Goal: return home PT Goal Formulation: With patient Time For Goal Achievement: 07/20/21 Potential to Achieve Goals: Good    Frequency Min 3X/week     Co-evaluation               AM-PAC PT "6 Clicks" Mobility  Outcome Measure Help needed turning from your back to your side while in a flat bed without using bedrails?: None Help needed moving from lying on your back to sitting on the side of a flat bed without using bedrails?: None Help needed moving to and from a bed to a chair (including a wheelchair)?: A Little Help needed standing up from a chair using your arms (e.g., wheelchair or bedside chair)?: None Help needed to walk in hospital room?: A Little Help needed climbing 3-5 steps with a railing? : A Little 6 Click Score: 21    End of Session   Activity Tolerance: Patient tolerated treatment well Patient left: in chair;with call bell/phone within reach Nurse Communication: Mobility status PT Visit Diagnosis: Other abnormalities of gait and mobility (R26.89)    Time: 0950-1010 PT Time Calculation (min) (ACUTE ONLY): 20 min   Charges:   PT Evaluation $PT Eval Moderate Complexity: 1 Mod          [place] Pager [phone] Office [place] 07/13/2021, 11:15 AM

## 2021-07-13 NOTE — Progress Notes (Addendum)
Heart Failure Nurse Navigator Progress Note  Assessed for HV TOC readiness. Pt had previously scheduled appt with AHF APP clinic 04/30/2021--pt "no show" to the appt.  Reached out to AHF clinic scheduler to get hospital follow up appt scheduled. Will update AVS when appt scheduled.   Addendum: AHF clinic appt 3/6 @ 12pm  Ozella Rocks, MSN, RN Heart Failure Nurse Navigator [phone]

## 2021-07-13 NOTE — Hospital Course (Signed)
Thomas West is a 61 y.o. 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

## 2021-07-13 NOTE — ED Notes (Signed)
6E called to initiate purple man process.

## 2021-07-13 NOTE — Progress Notes (Addendum)
PROGRESS NOTE  Thomas West [id number] DOB: 04-Apr-1961 DOA: 07/12/2021 PCP: [name], [name], MD  HPI/Recap of past 24 hours: Thomas West is a 61 y.o. 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  Associated with running out of his medication for 1 week.  Work-up revealed acute on chronic combined systolic and diastolic CHF.  Work-up revealed acute on chronic combined systolic and diastolic CHF.  He was restarted on his home cardiac medications with ongoing diuresing.  07/13/2021: Patient was seen and examined at his bedside.  States he feels better with diuresing.  Still volume overloaded on exam.  Ongoing diuresing.    Assessment/Plan: Principal Problem:   Acute on chronic combined systolic and diastolic CHF (congestive heart failure) (HCC) Active Problems:   CAD (coronary artery disease)   Type 2 diabetes mellitus with diabetic neuropathy, unspecified (HCC)   Hyperlipidemia associated with type 2 diabetes mellitus ([place])   Hypertension associated with diabetes ([place])   Chronic kidney disease, stage 3a (HCC)   Hyperkalemia  Acute on chronic combined systolic and diastolic CHF (congestive heart failure) ([place])- (present on admission) Presenting with progressive exertional dyspnea, lower extremity edema, elevated BNP in setting of medication nonadherence.  TTE 03/09/2021 showed EF 20-25% with G2DD.  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 Continue IV Lasix 60 mg twice daily Continue strict I/O's and daily weights Continue home meds: Coreg 6.25 mg BID, hydralazine 50 mg TID, Imdur 30 mg daily, Entresto 24-26 mg BID, spironolactone 25 mg daily Net I&O -1.3 L    Hypertension associated with diabetes ([place])- (present on admission) Significantly hypertensive on arrival in setting of medication nonadherence. -Resume home meds and continue diuresis as above BP normotensive at the time of this visit, 117/70.   Resolved posttreatment: Hyperkalemia- (present on admission) Initial potassium 6.1, repeat 4.2 after oral Lokelma.  Unclear accuracy of initial potassium as he had not been taking Entresto or spironolactone for about 1 week. -Continue to monitor with resumption of spironolactone and Entresto   CAD (coronary artery disease)- (present on admission) Mild obstructive CAD noted on right/left heart cath 03/11/2021 with widely patent previously placed RPAV stent.  Patient has been having some chest tightness.  Troponins are 18 and 19.  EKG not significantly changed from prior.  Symptoms likely related to acute on chronic CHF and accelerated hypertension. -Continue aspirin/Plavix -Continue atorvastatin -Continue Imdur, Coreg   Chronic kidney disease, stage 3a ([place])- (present on admission) Creatinine 1.40 on admission, at baseline.  Continue to monitor. Continue to avoid nephrotoxic agents, and hypotension. Continue to closely monitor urine output with strict I's and O's. Repeat renal panel in the morning while on IV diuresis.   Type 2 diabetes mellitus with diabetic neuropathy, unspecified ([place])- (present on admission) Hold metformin, place on SSI. Hemoglobin A1c 11.7 on 04/20/2021.   Hyperlipidemia associated with type 2 diabetes mellitus ([place])- (present on admission) Continue atorvastatin 80 mg daily.   DVT prophylaxis: Subcutaneous heparin 3 times daily. Code Status: Full code Family Communication: None at bedside.    Disposition Plan: Likely will discharge to home within the next 24 to 48 hours. Consults called: None Severity of Illness:     Status is: Inpatient  Patient requires at least 2 midnights for  further evaluation and  treatment of present condition.      Objective: Vitals:   07/13/21 0908 07/13/21 0913 07/13/21 1136 07/13/21 1513  BP: 103/69 130/84 115/72 117/70  Pulse:   72   Resp:   16   Temp: 98.5 F (36.9 C)  97.7 F (36.5 C)   TempSrc: Oral  Oral   SpO2:   98%   Weight:      Height:        Intake/Output Summary (Last 24 hours) at 07/13/2021 1708 Last data filed at 07/13/2021 [id number] Gross per 24 hour  Intake 253.11 ml  Output 1650 ml  Net -1396.89 ml   Filed Weights   07/12/21 2226 07/13/21 0125  Weight: 68 kg 69.7 kg    Exam:  General: 61 y.o. year-old male well developed well nourished in no acute distress.  Alert and oriented x3. Cardiovascular: Regular rate and rhythm with no rubs or gallops.  No thyromegaly.  Right JVD noted.   Respiratory: Clear to auscultation with no wheezes or rales. Good inspiratory effort. Abdomen: Soft nontender nondistended with normal bowel sounds x4 quadrants. Musculoskeletal: 1+ pitting edema lower extremities bilaterally.  Skin: No ulcerative lesions noted or rashes, Psychiatry: Mood is appropriate for condition and setting   Data Reviewed: CBC: Recent Labs  Lab 07/12/21 2134  WBC 6.6  HGB 12.3*  HCT 39.1  MCV 86.5  PLT [id number]   Basic Metabolic Panel: Recent Labs  Lab 07/12/21 2134 07/12/21 2234 07/13/21 0023  NA 137  --  137  K 6.1* 4.2 4.2  CL 107  --  102  CO2 20*  --  24  GLUCOSE 174*  --  270*  BUN 18  --  16  CREATININE 1.40*  --  1.41*  CALCIUM 8.1*  --  8.7*  MG  --   --  2.0   GFR: Estimated Creatinine Clearance: 56.3 mL/min (A) (by C-G formula based on SCr of 1.41 mg/dL (H)). Liver Function Tests: Recent Labs  Lab 07/12/21 2134 07/13/21 0023  AST 61* 45*  ALT 66* 60*  ALKPHOS 167* 181*  BILITOT 2.4* 1.1  PROT 6.3* 6.9  ALBUMIN 3.2* 3.3*   No results for input(s): LIPASE, AMYLASE in the last 168 hours. No results for input(s): AMMONIA in the last 168 hours. Coagulation Profile: No results for input(s):  INR, PROTIME in the last 168 hours. Cardiac Enzymes: No results for input(s): CKTOTAL, CKMB, CKMBINDEX, TROPONINI in the last 168 hours. BNP (last 3 results) Recent Labs    02/03/21 0911  PROBNP 3,012.0*   HbA1C: No results for input(s): HGBA1C in the last 72 hours. CBG: Recent Labs  Lab 07/13/21 0745 07/13/21 1133  GLUCAP 169* 188*   Lipid Profile: No results for input(s): CHOL, HDL, LDLCALC, TRIG, CHOLHDL, LDLDIRECT in the last 72 hours. Thyroid Function Tests: No results for input(s): TSH, T4TOTAL, FREET4, T3FREE, THYROIDAB in the last 72 hours. Anemia Panel: No results for input(s): VITAMINB12, FOLATE, FERRITIN, TIBC, IRON, RETICCTPCT in the last 72 hours. Urine analysis:    Component Value Date/Time   COLORURINE YELLOW 03/08/2021 0246   APPEARANCEUR HAZY (A) 03/08/2021 0246   LABSPEC 1.019 03/08/2021 0246   PHURINE 5.0 03/08/2021 0246   GLUCOSEU 150 (A) 03/08/2021 0246   HGBUR NEGATIVE 03/08/2021 0246   BILIRUBINUR NEGATIVE 03/08/2021 0246   BILIRUBINUR negative 01/02/2021 1312   KETONESUR NEGATIVE 03/08/2021 0246   PROTEINUR >=300 (A) 03/08/2021 0246   UROBILINOGEN 0.2 01/02/2021 1312   NITRITE NEGATIVE 03/08/2021  0246   LEUKOCYTESUR NEGATIVE 03/08/2021 0246   Sepsis Labs: @LABRCNTIP (procalcitonin:4,lacticidven:4)  ) Recent Results (from the past 240 hour(s))  Resp Panel by RT-PCR (Flu A&B, Covid) Nasopharyngeal Swab     Status: None   Collection Time: 07/12/21 10:21 PM   Specimen: Nasopharyngeal Swab; Nasopharyngeal(NP) swabs in vial transport medium  Result Value Ref Range Status   SARS Coronavirus 2 by RT PCR NEGATIVE NEGATIVE Final    Comment: (NOTE) SARS-CoV-2 target nucleic acids are NOT DETECTED.  The SARS-CoV-2 RNA is generally detectable in upper respiratory specimens during the acute phase of infection. The lowest concentration of SARS-CoV-2 viral copies this assay can detect is 138 copies/mL. A negative result does not preclude  SARS-Cov-2 infection and should not be used as the sole basis for treatment or other patient management decisions. A negative result may occur with  improper specimen collection/handling, submission of specimen other than nasopharyngeal swab, presence of viral mutation(s) within the areas targeted by this assay, and inadequate number of viral copies(<138 copies/mL). A negative result must be combined with clinical observations, patient history, and epidemiological information. The expected result is Negative.  Fact Sheet for Patients:  EntrepreneurPulse.com.au  Fact Sheet for Healthcare Providers:  IncredibleEmployment.be  This test is no t yet approved or cleared by the [place] FDA and  has been authorized for detection and/or diagnosis of SARS-CoV-2 by FDA under an Emergency Use Authorization (EUA). This EUA will remain  in effect (meaning this test can be used) for the duration of the COVID-19 declaration under Section 564(b)(1) of the Act, 21 U.S.C.section 360bbb-3(b)(1), unless the authorization is terminated  or revoked sooner.       Influenza A by PCR NEGATIVE NEGATIVE Final   Influenza B by PCR NEGATIVE NEGATIVE Final    Comment: (NOTE) The Xpert Xpress SARS-CoV-2/FLU/RSV plus assay is intended as an aid in the diagnosis of influenza from Nasopharyngeal swab specimens and should not be used as a sole basis for treatment. Nasal washings and aspirates are unacceptable for Xpert Xpress SARS-CoV-2/FLU/RSV testing.  Fact Sheet for Patients: EntrepreneurPulse.com.au  Fact Sheet for Healthcare Providers: IncredibleEmployment.be  This test is not yet approved or cleared by the [place] FDA and has been authorized for detection and/or diagnosis of SARS-CoV-2 by FDA under an Emergency Use Authorization (EUA). This EUA will remain in effect (meaning this test can be used) for the duration of  the COVID-19 declaration under Section 564(b)(1) of the Act, 21 U.S.C. section 360bbb-3(b)(1), unless the authorization is terminated or revoked.  Performed at [facility], [place] [address] [place], [place] [address]       Studies: DG Chest Port 1 View  Result Date: 07/12/2021 CLINICAL DATA:  Shortness of breath. EXAM: PORTABLE CHEST 1 VIEW COMPARISON:  Radiograph 04/20/2021 FINDINGS: Unchanged cardiomegaly. Stable mediastinal contours. Chronic right infrahilar atelectasis or scarring. No acute airspace disease. No pleural effusion or pneumothorax. No pulmonary edema. No acute osseous abnormalities. IMPRESSION: Stable cardiomegaly. No acute pulmonary process. Electronically Signed   By: Keith Rake M.D.   On: 07/12/2021 22:05    Scheduled Meds:  aspirin EC  81 mg Oral Daily   atorvastatin  80 mg Oral Daily   carvedilol  6.25 mg Oral BID WC   clopidogrel  75 mg Oral Daily   furosemide  60 mg Intravenous Q12H   heparin  5,000 Units Subcutaneous Q8H   hydrALAZINE  50 mg Oral TID   insulin aspart  0-9 Units Subcutaneous TID WC  sacubitril-valsartan  1 tablet Oral BID   sodium chloride flush  3 mL Intravenous Q12H   spironolactone  25 mg Oral Daily    Continuous Infusions:   LOS: 0 days     Kayleen Memos, MD [facility] Pager [phone]  If 7PM-7AM, please contact night-coverage [email or web address] Password [email or web address] 07/13/2021, 5:08 PM

## 2021-07-13 NOTE — Progress Notes (Signed)
Mobility Specialist Criteria Algorithm Info.   07/13/21 1251  Mobility  Activity Ambulated with assistance in hallway  Range of Motion/Exercises Active;All extremities  Level of Assistance Modified independent, requires aide device or extra time  Assistive Device None  Distance Ambulated (ft) 160 ft  Activity Response Tolerated well   Patient received dangling EOB eager to participate in mobility. Ambulated in hallway independently with steady gait. Returned to room without complaint or incident. Was left lying supine in bed with all needs met, call bell in reach.  07/13/2021 2:57 PM  Thomas West, [name], [name]  YHDQO:[phone] Office: [phone]

## 2021-07-13 NOTE — Evaluation (Signed)
Occupational Therapy Evaluation Patient Details Name: Thomas West MRN: [id number] DOB: [birth year] Today's Date: 07/13/2021   History of Present Illness Pt adm 2/19 with acute on chronic combined heart failure. PMH - CHF, CAD, DM II, MI, HTN, stroke, peripheral neuropathy, CKD II, and HLD.   Clinical Impression   Thomas West was evaluated s/p the above admission list, he is generally mod I with some assist with stair negotiation & LB dressing intermittently. He lives in a 2 level home, bed & bath on the 2nd level with his 59 yo son. Upon evaluation pt was set up/mod I for all bed level tasks and supervision for functional ambulation and OOB ADLs. He is slow and deliberate with ambulation, and was reaching for external support despite cues. Pt will benefit from continued OT acutely to address energy conservation and progress towards indep baseline. Recommend d/c home without OT follow up.    Recommendations for follow up therapy are one component of a multi-disciplinary discharge planning process, led by the attending physician.  Recommendations may be updated based on patient status, additional functional criteria and insurance authorization.   Follow Up Recommendations  No OT follow up    Assistance Recommended at Discharge Intermittent Supervision/Assistance  Patient can return home with the following A little help with walking and/or transfers;A little help with bathing/dressing/bathroom;Help with stairs or ramp for entrance    Functional Status Assessment  Patient has had a recent decline in their functional status and demonstrates the ability to make significant improvements in function in a reasonable and predictable amount of time.  Equipment Recommendations  Tub/shower seat       Precautions / Restrictions Precautions Precautions: None Restrictions Weight Bearing Restrictions: No      Mobility Bed Mobility Overal bed mobility: Modified Independent                   Transfers Overall transfer level: Modified independent Equipment used: None                      Balance Overall balance assessment: Needs assistance Sitting-balance support: No upper extremity supported, Feet supported Sitting balance-Leahy Scale: Normal     Standing balance support: No upper extremity supported, During functional activity Standing balance-Leahy Scale: Good Standing balance comment: however pt reaching out fro external support when walking in room                           ADL either performed or assessed with clinical judgement   ADL Overall ADL's : Needs assistance/impaired Eating/Feeding: Independent;Sitting   Grooming: Supervision/safety;Standing   Upper Body Bathing: Set up;Sitting   Lower Body Bathing: Supervison/ safety;Sit to/from stand   Upper Body Dressing : Set up;Sitting   Lower Body Dressing: Supervision/safety;Sitting/lateral leans   Toilet Transfer: Supervision/safety;Ambulation;Regular Toilet   Toileting- Clothing Manipulation and Hygiene: Supervision/safety;Sitting/lateral lean       Functional mobility during ADLs: Supervision/safety General ADL Comments: supervision for safety. pt with slow gait & reaching for external support with ambulation     Vision Baseline Vision/History: 0 No visual deficits Ability to See in Adequate Light: 0 Adequate Vision Assessment?: No apparent visual deficits            Pertinent Vitals/Pain Pain Assessment Pain Assessment: No/denies pain     Hand Dominance Right   Extremity/Trunk Assessment Upper Extremity Assessment Upper Extremity Assessment: Generalized weakness;Overall [facility] for tasks assessed   Lower Extremity Assessment Lower Extremity Assessment: Defer  to PT evaluation   Cervical / Trunk Assessment Cervical / Trunk Assessment: Normal   Communication Communication Communication: No difficulties   Cognition Arousal/Alertness: Awake/alert Behavior During  Therapy: WFL for tasks assessed/performed Overall Cognitive Status: Within Functional Limits for tasks assessed           General Comments: limited insight into deficits and safety     General Comments  VSS on RA            Home Living Family/patient expects to be discharged to:: Private residence Living Arrangements: Children Available Help at Discharge: Family;Available PRN/intermittently Type of Home: House Home Access: Stairs to enter     Home Layout: Two level;Bed/bath upstairs     Bathroom Shower/Tub: Occupational psychologist: Standard     Home Equipment: Rollator (4 wheels);BSC/3in1   Additional Comments: uses BSC as toilet downstairs      Prior Functioning/Environment Prior Level of Function : Needs assist       Physical Assist : Mobility (physical) Mobility (physical): Stairs   Mobility Comments: occasional assist from son to navigate stairs due to SOB ADLs Comments: generally mod I, states that he has to sit to shower and requires increased time to complete ADLs        OT Problem List: Cardiopulmonary status limiting activity      OT Treatment/Interventions: Self-care/ADL training;Therapeutic exercise;Therapeutic activities;Patient/family education;Balance training    OT Goals(Current goals can be found in the care plan section) Acute Rehab OT Goals Patient Stated Goal: home OT Goal Formulation: With patient Time For Goal Achievement: 07/27/21 Potential to Achieve Goals: Good ADL Goals Additional ADL Goal #1: pt will indep complete BADLs Additional ADL Goal #2: Pt will indep recall at least 3 energy conservation strategies to utilize at d/c Additional ADL Goal #3: pt will indep recall 3 fall prevention strategies to promote safety in the home  OT Frequency: Min 2X/week       AM-PAC OT "6 Clicks" Daily Activity     Outcome Measure Help from another person eating meals?: None Help from another person taking care of personal  grooming?: None Help from another person toileting, which includes using toliet, bedpan, or urinal?: A Little Help from another person bathing (including washing, rinsing, drying)?: A Little Help from another person to put on and taking off regular upper body clothing?: None Help from another person to put on and taking off regular lower body clothing?: A Little 6 Click Score: 21   End of Session    Activity Tolerance: Patient tolerated treatment well Patient left: in bed;with call bell/phone within reach  OT Visit Diagnosis: Unsteadiness on feet (R26.81)                Time: [id number] OT Time Calculation (min): 13 min Charges:  OT General Charges $OT Visit: 1 Visit OT Evaluation $OT Eval Low Complexity: 1 Low   Sheyanne Munley A Rozina Pointer 07/13/2021, 3:58 PM

## 2021-07-13 NOTE — Assessment & Plan Note (Signed)
Initial potassium 6.1, repeat 4.2 after oral Lokelma.  Unclear accuracy of initial potassium as he had not been taking Entresto or spironolactone for about 1 week. -Continue to monitor with resumption of spironolactone and Entresto

## 2021-07-14 LAB — GLUCOSE, CAPILLARY
Glucose-Capillary: 148 mg/dL — ABNORMAL HIGH (ref 70–99)
Glucose-Capillary: 146 mg/dL — ABNORMAL HIGH (ref 70–99)
Glucose-Capillary: 211 mg/dL — ABNORMAL HIGH (ref 70–99)
Glucose-Capillary: 174 mg/dL — ABNORMAL HIGH (ref 70–99)

## 2021-07-14 LAB — BASIC METABOLIC PANEL
Anion gap: 8 (ref 5–15)
BUN: 15 mg/dL (ref 6–20)
CO2: 30 mmol/L (ref 22–32)
Calcium: 8.2 mg/dL — ABNORMAL LOW (ref 8.9–10.3)
Chloride: 102 mmol/L (ref 98–111)
Creatinine, Ser: 1.32 mg/dL — ABNORMAL HIGH (ref 0.61–1.24)
GFR, Estimated: >60 mL/min (ref >60)
Glucose, Bld: 153 mg/dL — ABNORMAL HIGH (ref 70–99)
Potassium: 3.6 mmol/L (ref 3.5–5.1)
Sodium: 140 mmol/L (ref 135–145)

## 2021-07-14 LAB — TROPONIN I (HIGH SENSITIVITY): Troponin I (High Sensitivity): 17 ng/L (ref <18)

## 2021-07-14 LAB — MAGNESIUM: Magnesium: 1.7 mg/dL (ref 1.7–2.4)

## 2021-07-14 LAB — PHOSPHORUS: Phosphorus: 4.7 mg/dL — ABNORMAL HIGH (ref 2.5–4.6)

## 2021-07-14 MED ORDER — ENOXAPARIN SODIUM 40 MG/0.4ML IJ SOSY
40.0000 mg | PREFILLED_SYRINGE | INTRAMUSCULAR | Status: DC
Start: 2021-07-14 — End: 2021-07-15

## 2021-07-14 NOTE — Progress Notes (Signed)
PROGRESS NOTE  Thomas West [id number] DOB: 12-02-1960 DOA: 07/12/2021 PCP: [name], [name], MD  HPI/Recap of past 24 hours: Thomas West is a 61 y.o. 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  Associated with running out of his medication for 1 week.  Work-up revealed acute on chronic combined systolic and diastolic CHF, elevated troponin thought to be demand ischemia from acute CHF.  He was restarted on his home cardiac medications with ongoing IV diuresing.  07/14/2021: Patient was seen and examined at bedside.  Still volume overloaded on exam.  Ongoing diuresing, on IV Lasix 60 mg twice daily.    Assessment/Plan: Principal Problem:   Acute on chronic combined systolic and diastolic CHF (congestive heart failure) (HCC) Active Problems:   CAD (coronary artery disease)   Type 2 diabetes mellitus with diabetic neuropathy, unspecified (HCC)   Hyperlipidemia associated with type 2 diabetes mellitus (HCC)   Hypertension associated with diabetes (HCC)   Chronic kidney disease, stage 3a (HCC)   Hyperkalemia  Acute on chronic combined systolic and diastolic CHF (congestive heart failure) (HCC)- (present on admission) Presenting with progressive exertional dyspnea, lower extremity edema, elevated BNP in setting of medication nonadherence.  TTE 03/09/2021 showed EF 20-25% with G2DD.  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 Continue IV Lasix 60 mg twice daily Continue strict I/O's and daily weights Continue home meds: Coreg 6.25 mg BID, hydralazine 50 mg TID, Imdur 30 mg daily, Entresto 24-26 mg BID, spironolactone 25 mg daily Net I&O -3.1 L  Elevated troponin, demand  ischemia in the setting of acute CHF. Troponin peaked at 19, then normalized Ongoing diuresing, continue Continue home cardiac medications Continue to monitor on telemetry.  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 Repeat CMP in the morning  Hyperphosphatemia, suspect in the setting of renal insufficiency Phosphorus 4.7 Repeat phosphorus level in the morning.   Hypertension associated with diabetes (HCC)- (present on admission) BP is at goal 111/71. Continue to closely monitor vital signs.   Resolved posttreatment: Hyperkalemia- (present on admission) Initial potassium 6.1 on 2/19, received Lokelma. Serum potassium 3.6. Continue to monitor with resumption of spironolactone and Entresto   CAD (coronary artery disease)- (present on admission) Mild obstructive CAD noted on right/left heart cath 03/11/2021 with widely patent previously placed RPAV stent.  Patient has been having some chest tightness.  Troponins are 18 and 19, and 17 (WNL).   EKG not significantly changed from prior.   Symptoms likely related to acute on chronic CHF. -Continue home aspirin/Plavix -Continue home atorvastatin -Continue home Coreg -Home Imdur on hold to avoid hypotension.   Chronic kidney disease, stage 2 (HCC)- (present on admission) Creatinine 1.40 on admission, at baseline.  Continue to monitor. Back to his baseline creatinine 1.3 with GFR greater than 60. Continue to avoid nephrotoxic agent and hypotension. Continue to closely monitor urine output with strict I's and O's.   Type 2 diabetes mellitus with diabetic neuropathy and hyperglycemia, (HCC)- (present on admission) Continue to hold home oral hypoglycemics. Hemoglobin A1c 11.7 on 04/20/2021. Continue insulin sliding scale.   Hyperlipidemia associated with type 2 diabetes mellitus (HCC)- (present on admission) Continue home atorvastatin 80 mg daily.   DVT prophylaxis: Subcu Lovenox  daily. Code Status: Full code Family Communication: None at bedside.    Disposition Plan: Likely will discharge to home within the next 24 to 48 hours. Consults called: None   Status is: Inpatient  Patient requires at least 2 midnights for further evaluation and treatment of present condition.      Objective: Vitals:   07/14/21 0457 07/14/21 0722 07/14/21 0804 07/14/21 1207  BP: 116/84  (!) 139/94 111/71  Pulse: 70 71  73  Resp:    17  Temp: (!) 97.3 F (36.3 C) (!) 97.5 F (36.4 C)  98.9 F (37.2 C)  TempSrc: Oral Oral  Oral  SpO2: 98% 98%  100%  Weight: 65.3 kg     Height:        Intake/Output Summary (Last 24 hours) at 07/14/2021 1337 Last data filed at 07/14/2021 1207 Gross per 24 hour  Intake 840 ml  Output 2575 ml  Net -1735 ml   Filed Weights   07/12/21 2226 07/13/21 0125 07/14/21 0457  Weight: 68 kg 69.7 kg 65.3 kg    Exam:  General: 61 y.o. year-old male well-developed well-nourished in no acute distress.  He is alert and oriented x3.   Cardiovascular: Regular rate and rhythm no rubs or gallops. Respiratory: Clear to auscultation no wheezes or rales.   Abdomen: Soft nontender normal bowel sounds present.   Musculoskeletal: 1+ pitting edema in lower extremities bilaterally.   Skin: No ulcerative lesions noted. Psychiatry: Mood is appropriate for condition and setting.   Data Reviewed: CBC: Recent Labs  Lab 07/12/21 2134  WBC 6.6  HGB 12.3*  HCT 39.1  MCV 86.5  PLT [id number]   Basic Metabolic Panel: Recent Labs  Lab 07/12/21 2134 07/12/21 2234 07/13/21 0023 07/14/21 0513  NA 137  --  137 140  K 6.1* 4.2 4.2 3.6  CL 107  --  102 102  CO2 20*  --  24 30  GLUCOSE 174*  --  270* 153*  BUN 18  --  16 15  CREATININE 1.40*  --  1.41* 1.32*  CALCIUM 8.1*  --  8.7* 8.2*  MG  --   --  2.0 1.7  PHOS  --   --   --  4.7*   GFR: Estimated Creatinine Clearance: 56.3 mL/min (A) (by C-G formula based on SCr of 1.32 mg/dL (H)). Liver Function  Tests: Recent Labs  Lab 07/12/21 2134 07/13/21 0023  AST 61* 45*  ALT 66* 60*  ALKPHOS 167* 181*  BILITOT 2.4* 1.1  PROT 6.3* 6.9  ALBUMIN 3.2* 3.3*   No results for input(s): LIPASE, AMYLASE in the last 168 hours. No results for input(s): AMMONIA in the last 168 hours. Coagulation Profile: No results for input(s): INR, PROTIME in the last 168 hours. Cardiac Enzymes: No results for input(s): CKTOTAL, CKMB, CKMBINDEX, TROPONINI in the last 168 hours. BNP (last 3 results) Recent Labs    02/03/21 0911  PROBNP 3,012.0*   HbA1C: No results for input(s): HGBA1C in the last 72 hours. CBG: Recent Labs  Lab 07/13/21 1133 07/13/21 1711 07/13/21 2024 07/14/21 0719 07/14/21 1202  GLUCAP 188* 156* 199* 148* 146*   Lipid Profile: No results for input(s): CHOL, HDL, LDLCALC, TRIG, CHOLHDL, LDLDIRECT in the last 72 hours. Thyroid Function Tests: No results for input(s): TSH, T4TOTAL, FREET4, T3FREE, THYROIDAB in the last 72 hours. Anemia Panel: No results for input(s): VITAMINB12, FOLATE, FERRITIN, TIBC, IRON, RETICCTPCT in the last 72 hours. Urine analysis:    Component Value Date/Time  COLORURINE YELLOW 03/08/2021 0246   APPEARANCEUR HAZY (A) 03/08/2021 0246   LABSPEC 1.019 03/08/2021 0246   PHURINE 5.0 03/08/2021 0246   GLUCOSEU 150 (A) 03/08/2021 0246   HGBUR NEGATIVE 03/08/2021 0246   BILIRUBINUR NEGATIVE 03/08/2021 0246   BILIRUBINUR negative 01/02/2021 1312   KETONESUR NEGATIVE 03/08/2021 0246   PROTEINUR >=300 (A) 03/08/2021 0246   UROBILINOGEN 0.2 01/02/2021 1312   NITRITE NEGATIVE 03/08/2021 0246   LEUKOCYTESUR NEGATIVE 03/08/2021 0246   Sepsis Labs: @LABRCNTIP (procalcitonin:4,lacticidven:4)  ) Recent Results (from the past 240 hour(s))  Resp Panel by RT-PCR (Flu A&B, Covid) Nasopharyngeal Swab     Status: None   Collection Time: 07/12/21 10:21 PM   Specimen: Nasopharyngeal Swab; Nasopharyngeal(NP) swabs in vial transport medium  Result Value Ref Range  Status   SARS Coronavirus 2 by RT PCR NEGATIVE NEGATIVE Final    Comment: (NOTE) SARS-CoV-2 target nucleic acids are NOT DETECTED.  The SARS-CoV-2 RNA is generally detectable in upper respiratory specimens during the acute phase of infection. The lowest concentration of SARS-CoV-2 viral copies this assay can detect is 138 copies/mL. A negative result does not preclude SARS-Cov-2 infection and should not be used as the sole basis for treatment or other patient management decisions. A negative result may occur with  improper specimen collection/handling, submission of specimen other than nasopharyngeal swab, presence of viral mutation(s) within the areas targeted by this assay, and inadequate number of viral copies(<138 copies/mL). A negative result must be combined with clinical observations, patient history, and epidemiological information. The expected result is Negative.  Fact Sheet for Patients:  EntrepreneurPulse.com.au  Fact Sheet for Healthcare Providers:  IncredibleEmployment.be  This test is no t yet approved or cleared by the [place] FDA and  has been authorized for detection and/or diagnosis of SARS-CoV-2 by FDA under an Emergency Use Authorization (EUA). This EUA will remain  in effect (meaning this test can be used) for the duration of the COVID-19 declaration under Section 564(b)(1) of the Act, 21 U.S.C.section 360bbb-3(b)(1), unless the authorization is terminated  or revoked sooner.       Influenza A by PCR NEGATIVE NEGATIVE Final   Influenza B by PCR NEGATIVE NEGATIVE Final    Comment: (NOTE) The Xpert Xpress SARS-CoV-2/FLU/RSV plus assay is intended as an aid in the diagnosis of influenza from Nasopharyngeal swab specimens and should not be used as a sole basis for treatment. Nasal washings and aspirates are unacceptable for Xpert Xpress SARS-CoV-2/FLU/RSV testing.  Fact Sheet for  Patients: EntrepreneurPulse.com.au  Fact Sheet for Healthcare Providers: IncredibleEmployment.be  This test is not yet approved or cleared by the [place] FDA and has been authorized for detection and/or diagnosis of SARS-CoV-2 by FDA under an Emergency Use Authorization (EUA). This EUA will remain in effect (meaning this test can be used) for the duration of the COVID-19 declaration under Section 564(b)(1) of the Act, 21 U.S.C. section 360bbb-3(b)(1), unless the authorization is terminated or revoked.  Performed at [facility], [place] [address], [place], [place] [address]       Studies: No results found.  Scheduled Meds:  aspirin EC  81 mg Oral Daily   atorvastatin  80 mg Oral Daily   carvedilol  6.25 mg Oral BID WC   clopidogrel  75 mg Oral Daily   furosemide  60 mg Intravenous Q12H   heparin  5,000 Units Subcutaneous Q8H   hydrALAZINE  50 mg Oral TID   insulin aspart  0-9 Units Subcutaneous TID WC  sacubitril-valsartan  1 tablet Oral BID   sodium chloride flush  3 mL Intravenous Q12H   spironolactone  25 mg Oral Daily    Continuous Infusions:   LOS: 1 day     Kayleen Memos, MD [facility] Pager [phone]  If 7PM-7AM, please contact night-coverage [email or web address] Password [facility] 07/14/2021, 1:37 PM

## 2021-07-14 NOTE — Progress Notes (Signed)
RN received call from pt's sister stating pt was having dark stools which is abnormal for him. MD ordered FOBT. When RN went to collect the sample, Physical Therapy in the room stated the sample was from yesterday, so currently black and dried out. RN discussed with tech, pt had a large BM this am that was brown and solid. MD Margo Aye notified, FOBT order d/c'd.

## 2021-07-14 NOTE — Progress Notes (Signed)
Informed by bedside RN that the patient has refused pharmacological DVT prophylaxis.  Bedside RN also reported small dark solid stool.  Will obtain FOBT.  SCDs ordered for nonpharmacological DVT prophylaxis.

## 2021-07-14 NOTE — Progress Notes (Signed)
Physical Therapy Treatment Patient Details Name: Thomas West MRN: [id number] DOB: 1960-09-15 Today's Date: 07/14/2021   History of Present Illness Pt adm 2/19 with acute on chronic combined heart failure. PMH - CHF, CAD, DM II, MI, HTN, stroke, peripheral neuropathy, CKD II, and HLD.    PT Comments    Pt with steady gait today but limited by neuropathy in bilateral feet so did not practice stairs. Continue to feel pt can return home from PT standpoint when medically ready.    Recommendations for follow up therapy are one component of a multi-disciplinary discharge planning process, led by the attending physician.  Recommendations may be updated based on patient status, additional functional criteria and insurance authorization.  Follow Up Recommendations  No PT follow up     Assistance Recommended at Discharge PRN  Patient can return home with the following Help with stairs or ramp for entrance   Equipment Recommendations  None recommended by PT    Recommendations for Other Services       Precautions / Restrictions Precautions Precautions: None Restrictions Weight Bearing Restrictions: No     Mobility  Bed Mobility Overal bed mobility: Modified Independent                  Transfers Overall transfer level: Modified independent Equipment used: None                    Ambulation/Gait Ambulation/Gait assistance: Modified independent (Device/Increase time) Gait Distance (Feet): 170 Feet Assistive device: Rolling walker (2 wheels), None Gait Pattern/deviations: Step-through pattern, Decreased stride length Gait velocity: decr Gait velocity interpretation: 1.31 - 2.62 ft/sec, indicative of limited community ambulator   General Gait Details: Steady gait. Pt with bilateral foot pain due to neuropathy. So deferred stairs today   Stairs             Wheelchair Mobility    Modified Rankin (Stroke Patients Only)       Balance Overall balance  assessment: Needs assistance Sitting-balance support: No upper extremity supported, Feet supported Sitting balance-Leahy Scale: Normal     Standing balance support: No upper extremity supported, During functional activity Standing balance-Leahy Scale: Good                              Cognition Arousal/Alertness: Awake/alert Behavior During Therapy: WFL for tasks assessed/performed Overall Cognitive Status: Within Functional Limits for tasks assessed                                          Exercises      General Comments General comments (skin integrity, edema, etc.): VSS on RA      Pertinent Vitals/Pain Pain Assessment Pain Assessment: 0-10 Pain Score: 8  Pain Location: bil feet Pain Descriptors / Indicators: Burning, Tingling Pain Intervention(s): Limited activity within patient's tolerance, Repositioned, Patient requesting pain meds-RN notified    Home Living                          Prior Function            PT Goals (current goals can now be found in the care plan section) Acute Rehab PT Goals Patient Stated Goal: return home Progress towards PT goals: Progressing toward goals    Frequency    Min 3X/week  PT Plan Current plan remains appropriate    Co-evaluation              AM-PAC PT "6 Clicks" Mobility   Outcome Measure  Help needed turning from your back to your side while in a flat bed without using bedrails?: None Help needed moving from lying on your back to sitting on the side of a flat bed without using bedrails?: None Help needed moving to and from a bed to a chair (including a wheelchair)?: None Help needed standing up from a chair using your arms (e.g., wheelchair or bedside chair)?: None Help needed to walk in hospital room?: None Help needed climbing 3-5 steps with a railing? : A Little 6 Click Score: 23    End of Session   Activity Tolerance: Patient tolerated treatment  well Patient left: in chair;with call bell/phone within reach Nurse Communication: Mobility status PT Visit Diagnosis: Other abnormalities of gait and mobility (R26.89)     Time: 9381-0175 PT Time Calculation (min) (ACUTE ONLY): 11 min  Charges:  $Gait Training: 8-22 mins                     [facility] PT Acute Rehabilitation Services Pager [phone] Office [phone]    Angelina Ok [facility] 07/14/2021, 2:47 PM

## 2021-07-15 ENCOUNTER — Other Ambulatory Visit (HOSPITAL_COMMUNITY): Payer: Self-pay

## 2021-07-15 LAB — CBC
HCT: 40.1 % (ref 39.0–52.0)
Hemoglobin: 13.6 g/dL (ref 13.0–17.0)
MCH: 27.8 pg (ref 26.0–34.0)
MCHC: 33.9 g/dL (ref 30.0–36.0)
MCV: 81.8 fL (ref 80.0–100.0)
Platelets: 357 10*3/uL (ref 150–400)
RBC: 4.9 MIL/uL (ref 4.22–5.81)
RDW: 18.7 % — ABNORMAL HIGH (ref 11.5–15.5)
WBC: 5.9 10*3/uL (ref 4.0–10.5)
nRBC: 0 % (ref 0.0–0.2)

## 2021-07-15 LAB — COMPREHENSIVE METABOLIC PANEL
ALT: 37 U/L (ref 0–44)
AST: 18 U/L (ref 15–41)
Albumin: 2.8 g/dL — ABNORMAL LOW (ref 3.5–5.0)
Alkaline Phosphatase: 133 U/L — ABNORMAL HIGH (ref 38–126)
Anion gap: 10 (ref 5–15)
BUN: 14 mg/dL (ref 6–20)
CO2: 30 mmol/L (ref 22–32)
Calcium: 8.3 mg/dL — ABNORMAL LOW (ref 8.9–10.3)
Chloride: 96 mmol/L — ABNORMAL LOW (ref 98–111)
Creatinine, Ser: 1.49 mg/dL — ABNORMAL HIGH (ref 0.61–1.24)
GFR, Estimated: 54 mL/min — ABNORMAL LOW (ref >60)
Glucose, Bld: 209 mg/dL — ABNORMAL HIGH (ref 70–99)
Potassium: 3.5 mmol/L (ref 3.5–5.1)
Sodium: 136 mmol/L (ref 135–145)
Total Bilirubin: 1.1 mg/dL (ref 0.3–1.2)
Total Protein: 6.2 g/dL — ABNORMAL LOW (ref 6.5–8.1)

## 2021-07-15 LAB — MAGNESIUM: Magnesium: 1.8 mg/dL (ref 1.7–2.4)

## 2021-07-15 LAB — GLUCOSE, CAPILLARY
Glucose-Capillary: 222 mg/dL — ABNORMAL HIGH (ref 70–99)
Glucose-Capillary: 136 mg/dL — ABNORMAL HIGH (ref 70–99)

## 2021-07-15 LAB — BRAIN NATRIURETIC PEPTIDE: B Natriuretic Peptide: 824.7 pg/mL — ABNORMAL HIGH (ref 0.0–100.0)

## 2021-07-15 LAB — PHOSPHORUS: Phosphorus: 4.9 mg/dL — ABNORMAL HIGH (ref 2.5–4.6)

## 2021-07-15 MED ORDER — SITAGLIPTIN PHOSPHATE 25 MG PO TABS
25.0000 mg | ORAL_TABLET | Freq: Every day | ORAL | 0 refills | Status: DC
  Filled 2021-07-15: qty 90, 90d supply, fill #0

## 2021-07-15 MED ORDER — ASPIRIN 81 MG PO TBEC
81.0000 mg | DELAYED_RELEASE_TABLET | Freq: Every day | ORAL | 0 refills | Status: DC
  Filled 2021-07-15: qty 90, 90d supply, fill #0

## 2021-07-15 MED ORDER — SACUBITRIL-VALSARTAN 24-26 MG PO TABS
1.0000 | ORAL_TABLET | Freq: Two times a day (BID) | ORAL | 0 refills | Status: DC
  Filled 2021-07-15: qty 60, 30d supply, fill #0

## 2021-07-15 MED ORDER — HYDRALAZINE HCL 50 MG PO TABS
50.0000 mg | ORAL_TABLET | Freq: Three times a day (TID) | ORAL | 0 refills | Status: DC
  Filled 2021-07-15: qty 270, 90d supply, fill #0

## 2021-07-15 MED ORDER — CLOPIDOGREL BISULFATE 75 MG PO TABS
75.0000 mg | ORAL_TABLET | Freq: Every day | ORAL | 0 refills | Status: DC
  Filled 2021-07-15: qty 30, 30d supply, fill #0

## 2021-07-15 MED ORDER — SPIRONOLACTONE 25 MG PO TABS
25.0000 mg | ORAL_TABLET | Freq: Every day | ORAL | 0 refills | Status: DC
  Filled 2021-07-15: qty 90, 90d supply, fill #0

## 2021-07-15 MED ORDER — POTASSIUM CHLORIDE CRYS ER 10 MEQ PO TBCR
10.0000 meq | EXTENDED_RELEASE_TABLET | Freq: Every day | ORAL | 0 refills | Status: DC
  Filled 2021-07-15: qty 90, 90d supply, fill #0

## 2021-07-15 MED ORDER — POTASSIUM CHLORIDE CRYS ER 20 MEQ PO TBCR
30.0000 meq | EXTENDED_RELEASE_TABLET | Freq: Once | ORAL | Status: AC
  Administered 2021-07-15: 30 meq via ORAL
  Filled 2021-07-15: qty 1

## 2021-07-15 MED ORDER — FUROSEMIDE 40 MG PO TABS
40.0000 mg | ORAL_TABLET | Freq: Every day | ORAL | 0 refills | Status: DC
  Filled 2021-07-15: qty 90, 90d supply, fill #0

## 2021-07-15 MED ORDER — ATORVASTATIN CALCIUM 80 MG PO TABS
80.0000 mg | ORAL_TABLET | Freq: Every day | ORAL | 0 refills | Status: DC
  Filled 2021-07-15: qty 90, 90d supply, fill #0

## 2021-07-15 MED ORDER — POLYETHYLENE GLYCOL 3350 17 GM/SCOOP PO POWD
17.0000 g | Freq: Every day | ORAL | 0 refills | Status: AC
  Filled 2021-07-15: qty 238, 14d supply, fill #0

## 2021-07-15 MED ORDER — CARVEDILOL 6.25 MG PO TABS
6.2500 mg | ORAL_TABLET | Freq: Two times a day (BID) | ORAL | 0 refills | Status: DC
  Filled 2021-07-15: qty 180, 90d supply, fill #0

## 2021-07-15 MED ORDER — OXYCODONE-ACETAMINOPHEN 5-325 MG PO TABS
1.0000 | ORAL_TABLET | Freq: Four times a day (QID) | ORAL | 0 refills | Status: AC | PRN
Start: 2021-07-15 — End: 2021-07-18
  Filled 2021-07-15: qty 12, 3d supply, fill #0

## 2021-07-15 MED ORDER — MAGNESIUM SULFATE 2 GM/50ML IV SOLN
2.0000 g | Freq: Once | INTRAVENOUS | Status: AC
  Administered 2021-07-15: 2 g via INTRAVENOUS
  Filled 2021-07-15: qty 50

## 2021-07-15 NOTE — Care Management (Addendum)
1046 07-15-21 Case Manager spoke with patient yesterday regarding home health needs. The patient asked Case Manager to call his sister. Case Manager called Clydie Braun and she states that the patient will benefit from [name] RN. Family did not have a preference at this time. Case Manager called [facility]. [facility] will be able to accept the patient at this time. Start of care to begin within 24-48 hours post transition home. Office to call the patient with visit times. Pharmacy will deliver the medications to the room prior to transition home.   1122 07-15-21 Case Manager called [employer] for the shower chair. If patient does not have Medicaid, he may have to pay out of pocket. Case Manager will continue to follow.

## 2021-07-15 NOTE — Discharge Summary (Signed)
Discharge Summary  Thomas West [id number] DOB: 1960/11/11  PCP: [name], [name], MD  Admit date: 07/12/2021 Discharge date: 07/15/2021  Time spent: 35 minutes.  Recommendations for Outpatient Follow-up:  Follow-up with cardiology in 1 to 2 weeks. Follow-up with your primary care provider in 1 to 2 weeks. Take your medications as prescribed. Continue home health services with RN for management of your medications.  Discharge Diagnoses:  Active Hospital Problems   Diagnosis Date Noted   Acute on chronic combined systolic and diastolic CHF (congestive heart failure) ([place]) 03/09/2021   Hypertension associated with diabetes ([place]) 07/12/2021   Chronic kidney disease, stage 3a ([place]) 07/12/2021   Hyperkalemia 07/12/2021   Hyperlipidemia associated with type 2 diabetes mellitus ([place]) 03/27/2019   CAD (coronary artery disease) 03/05/2019   Type 2 diabetes mellitus with diabetic neuropathy, unspecified ([place]) 03/05/2019    Resolved Hospital Problems  No resolved problems to display.    Discharge Condition: Stable.  Diet recommendation: Resume previous diet.  Vitals:   07/15/21 0012 07/15/21 0546  BP: 126/74 132/88  Pulse: 72 76  Resp:    Temp: 98.5 F (36.9 C) 98.4 F (36.9 C)  SpO2: 100% 95%    History of present illness:  Thomas West is a 61 y.o. 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  He ran out of his cardiac medications for 1 week.    Work-up revealed acute on chronic combined systolic and diastolic CHF, elevated troponin thought to be demand ischemia from acute CHF.  He was restarted on his home cardiac medications with additional IV diuresing.   07/15/2021: Patient  was seen in his bedside.  There were no acute events overnight.  He has no new complaints.  He is eager to go home.  Hospital Course:  Principal Problem:   Acute on chronic combined systolic and diastolic CHF (congestive heart failure) (HCC) Active Problems:   CAD (coronary artery disease)   Type 2 diabetes mellitus with diabetic neuropathy, unspecified (HCC)   Hyperlipidemia associated with type 2 diabetes mellitus ([place])   Hypertension associated with diabetes ([place])   Chronic kidney disease, stage 3a (HCC)   Hyperkalemia  Acute on chronic combined systolic and diastolic CHF (congestive heart failure) ([place])- (present on admission) Presenting with progressive exertional dyspnea, lower extremity edema, elevated BNP greater than 2900 in setting of medication nonadherence.   TTE 03/09/2021 showed EF 20-25% with G2DD.   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 Received IV Lasix 60 mg twice daily, switched to po Lasix 40 mg daily on 07/15/21. BNP improved 820 from 2900 after diuresing. Continue home meds: Coreg 6.25 mg BID, hydralazine 50 mg TID, Entresto 24-26 mg BID, spironolactone 25 mg daily. Imdur on hold to avoid hypotension. Net I&O -3.7 L Follow up with cardiology   Elevated troponin, demand ischemia in the setting of acute CHF. Troponin peaked at 19, then normalized Received IV diuresing, switched to po Lasix as stated above. Continue home cardiac medications Follow up with cardiology.   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 Follow up with your PCP   Hyperphosphatemia, suspect in the setting of renal insufficiency Phosphorus 4.7>4.9 Follow up with your PCP   Hypertension  associated with diabetes ([place])- (present on admission) BP is at goal 111/71. Continue to closely monitor vital signs.   Resolved posttreatment: Hyperkalemia- (present on admission) Initial potassium 6.1 on  2/19, received Lokelma. Serum potassium 3.6>3.5, repleted with po KCL 30 meq x 1 dose on 07/15/21.   CAD (coronary artery disease)- (present on admission) Mild obstructive CAD noted on right/left heart cath 03/11/2021 with widely patent previously placed RPAV stent.  Patient has been having some chest tightness.  Troponins are 18 and 19, and 17 (WNL).   EKG not significantly changed from prior.   Symptoms likely related to acute on chronic CHF. -Continue home aspirin/Plavix -Continue home atorvastatin -Continue home Coreg -Home Imdur on hold to avoid hypotension. Follow up with cardiology   Chronic kidney disease, stage 2 ([place])- (present on admission) Creatinine 1.40 on admission, at baseline.  Continue to monitor. Back to his baseline creatinine 1.3 with GFR greater than 60. Continue to avoid nephrotoxic agent and hypotension.   Type 2 diabetes mellitus with diabetic neuropathy and hyperglycemia, ([place])- (present on admission) Continue to hold home oral hypoglycemics. Hemoglobin A1c 11.7 on 04/20/2021. Resume home regimen and follow up with your PCP Holding off metformin due to risk of lactic acidosis with worsening renal function. Defer to your PCP to restart Metformin.  Advise to recheck renal function prior to restarting.   Hyperlipidemia associated with type 2 diabetes mellitus ([place])- (present on admission) Continue home atorvastatin 80 mg daily.    Code Status: Full code   Discharge Exam: BP 132/88 (BP Location: Left Arm)    Pulse 76    Temp 98.4 F (36.9 C) (Oral)    Resp 17    Ht 5\' 9"  (1.753 m)    Wt 63.4 kg    SpO2 95%    BMI 20.64 kg/m  General: 61 y.o. year-old male well developed well nourished in no acute distress.  Alert and oriented x3. Cardiovascular: Regular rate and rhythm with no rubs or gallops.  No thyromegaly or JVD noted.   Respiratory: Clear to auscultation with no wheezes or rales. Good inspiratory effort. Abdomen: Soft nontender nondistended with normal  bowel sounds x4 quadrants. Musculoskeletal: Trace lower extremity edema. Psychiatry: Mood is appropriate for condition and setting  Discharge Instructions You were cared for by a hospitalist during your hospital stay. If you have any questions about your discharge medications or the care you received while you were in the hospital after you are discharged, you can call the unit and asked to speak with the hospitalist on call if the hospitalist that took care of you is not available. Once you are discharged, your primary care physician will handle any further medical issues. Please note that NO REFILLS for any discharge medications will be authorized once you are discharged, as it is imperative that you return to your primary care physician (or establish a relationship with a primary care physician if you do not have one) for your aftercare needs so that they can reassess your need for medications and monitor your lab values.   Allergies as of 07/15/2021       Reactions   Bee Venom    swelling        Medication List     STOP taking these medications    isosorbide mononitrate 30 MG 24 hr tablet Commonly known as: IMDUR   metFORMIN 500 MG tablet Commonly known as: Glucophage       TAKE these medications    Aspirin Low Dose 81  MG EC tablet Generic drug: aspirin Take 1 tablet (81 mg total) by mouth daily.   atorvastatin 80 MG tablet Commonly known as: LIPITOR Take 1 tablet (80 mg total) by mouth daily.   carvedilol 6.25 MG tablet Commonly known as: COREG Take 1 tablet (6.25 mg total) by mouth EVERY 12 HOURS (10AM &10PM) What changed: Another medication with the same name was removed. Continue taking this medication, and follow the directions you see here.   clopidogrel 75 MG tablet Commonly known as: PLAVIX Take 1 tablet (75 mg total) by mouth daily.   Entresto 24-26 MG Generic drug: sacubitril-valsartan Take 1 tablet by mouth 2 (two) times daily.   furosemide 40 MG  tablet Commonly known as: Lasix Take 1 tablet (40 mg total) by mouth daily.   hydrALAZINE 50 MG tablet Commonly known as: APRESOLINE Take 1 tablet (50 mg total) by mouth 3 (three) times daily.   Januvia 25 MG tablet Generic drug: sitaGLIPtin Take 1 tablet (25 mg total) by mouth daily.   oxyCODONE-acetaminophen 5-325 MG tablet Commonly known as: PERCOCET/ROXICET Take 1 tablet by mouth every 6 (six) hours as needed for up to 3 days for moderate pain or severe pain.   polyethylene glycol powder 17 GM/SCOOP powder Commonly known as: GLYCOLAX/MIRALAX Take 1 capful (17 g) with water by mouth daily.   potassium chloride 10 MEQ tablet Commonly known as: KLOR-CON M Take 1 tablet (10 mEq total) by mouth daily.   spironolactone 25 MG tablet Commonly known as: ALDACTONE Take 1 tablet (25 mg total) by mouth daily.               Durable Medical Equipment  (From admission, onward)           Start     Ordered   07/15/21 1022  For home use only DME Shower stool  Once        07/15/21 1021           Allergies  Allergen Reactions   Bee Venom     swelling    Follow-up Information     [facility]. Go to.   Specialty: Cardiology Why: 3/6 @ noon for your appointment with the [facility].  Please arrive/check-in 15 minutes early to your scheduled appt. FREE valet parking at [employer], off [address]. Contact information: [address] [id number] [place] [id number] [phone]        [name], [name], MD. Call today.   Specialty: Family Medicine Why: Please call for a posthospital follow-up appointment. Contact information: [place] [address] [phone]         Josue Hector, MD .   Specialty: Cardiology Contact information: [phone] [place] [address] [place], [place] Follow up.    Specialty: Home Health Services Why: Registered Nurse-office to call with visit times Contact information: [address] [place] [address] [phone]         [facility] Follow up.   Why: Shower chair- office to call with visit. Contact information: [place] [address] [phone]                  The results of significant diagnostics from this hospitalization (including imaging, microbiology, ancillary and laboratory) are listed below for reference.    Significant Diagnostic Studies: DG Chest  Port 1 View  Result Date: 07/12/2021 CLINICAL DATA:  Shortness of breath. EXAM: PORTABLE CHEST 1 VIEW COMPARISON:  Radiograph 04/20/2021 FINDINGS: Unchanged cardiomegaly. Stable mediastinal contours. Chronic right infrahilar atelectasis or scarring. No acute airspace disease. No pleural effusion or pneumothorax. No pulmonary edema. No acute osseous abnormalities. IMPRESSION: Stable cardiomegaly. No acute pulmonary process. Electronically Signed   By: Keith Rake M.D.   On: 07/12/2021 22:05    Microbiology: Recent Results (from the past 240 hour(s))  Resp Panel by RT-PCR (Flu A&B, Covid) Nasopharyngeal Swab     Status: None   Collection Time: 07/12/21 10:21 PM   Specimen: Nasopharyngeal Swab; Nasopharyngeal(NP) swabs in vial transport medium  Result Value Ref Range Status   SARS Coronavirus 2 by RT PCR NEGATIVE NEGATIVE Final    Comment: (NOTE) SARS-CoV-2 target nucleic acids are NOT DETECTED.  The SARS-CoV-2 RNA is generally detectable in upper respiratory specimens during the acute phase of infection. The lowest concentration of SARS-CoV-2 viral copies this assay can detect is 138 copies/mL. A negative result does not preclude SARS-Cov-2 infection and should not be used as the sole basis for treatment or other patient management decisions. A negative result may occur with  improper specimen collection/handling, submission of  specimen other than nasopharyngeal swab, presence of viral mutation(s) within the areas targeted by this assay, and inadequate number of viral copies(<138 copies/mL). A negative result must be combined with clinical observations, patient history, and epidemiological information. The expected result is Negative.  Fact Sheet for Patients:  EntrepreneurPulse.com.au  Fact Sheet for Healthcare Providers:  IncredibleEmployment.be  This test is no t yet approved or cleared by the [place] FDA and  has been authorized for detection and/or diagnosis of SARS-CoV-2 by FDA under an Emergency Use Authorization (EUA). This EUA will remain  in effect (meaning this test can be used) for the duration of the COVID-19 declaration under Section 564(b)(1) of the Act, 21 U.S.C.section 360bbb-3(b)(1), unless the authorization is terminated  or revoked sooner.       Influenza A by PCR NEGATIVE NEGATIVE Final   Influenza B by PCR NEGATIVE NEGATIVE Final    Comment: (NOTE) The Xpert Xpress SARS-CoV-2/FLU/RSV plus assay is intended as an aid in the diagnosis of influenza from Nasopharyngeal swab specimens and should not be used as a sole basis for treatment. Nasal washings and aspirates are unacceptable for Xpert Xpress SARS-CoV-2/FLU/RSV testing.  Fact Sheet for Patients: EntrepreneurPulse.com.au  Fact Sheet for Healthcare Providers: IncredibleEmployment.be  This test is not yet approved or cleared by the [place] FDA and has been authorized for detection and/or diagnosis of SARS-CoV-2 by FDA under an Emergency Use Authorization (EUA). This EUA will remain in effect (meaning this test can be used) for the duration of the COVID-19 declaration under Section 564(b)(1) of the Act, 21 U.S.C. section 360bbb-3(b)(1), unless the authorization is terminated or revoked.  Performed at [facility], [place] [address]  [place], [place] [address]      Labs: Basic Metabolic Panel: Recent Labs  Lab 07/12/21 2134 07/12/21 2234 07/13/21 0023 07/14/21 0513 07/15/21 0239  NA 137  --  137 140 136  K 6.1* 4.2 4.2 3.6 3.5  CL 107  --  102 102 96*  CO2 20*  --  24 30 30   GLUCOSE 174*  --  270* 153* 209*  BUN 18  --  16 15 14   CREATININE 1.40*  --  1.41* 1.32* 1.49*  CALCIUM 8.1*  --  8.7* 8.2* 8.3*  MG  --   --  2.0 1.7 1.8  PHOS  --   --   --  4.7* 4.9*   Liver Function Tests: Recent Labs  Lab 07/12/21 2134 07/13/21 0023 07/15/21 0239  AST 61* 45* 18  ALT 66* 60* 37  ALKPHOS 167* 181* 133*  BILITOT 2.4* 1.1 1.1  PROT 6.3* 6.9 6.2*  ALBUMIN 3.2* 3.3* 2.8*   No results for input(s): LIPASE, AMYLASE in the last 168 hours. No results for input(s): AMMONIA in the last 168 hours. CBC: Recent Labs  Lab 07/12/21 2134 07/15/21 0239  WBC 6.6 5.9  HGB 12.3* 13.6  HCT 39.1 40.1  MCV 86.5 81.8  PLT 323 357   Cardiac Enzymes: No results for input(s): CKTOTAL, CKMB, CKMBINDEX, TROPONINI in the last 168 hours. BNP: BNP (last 3 results) Recent Labs    04/19/21 2359 07/12/21 2134 07/15/21 0239  BNP 3,306.4* 2,927.8* 824.7*    ProBNP (last 3 results) Recent Labs    02/03/21 0911  PROBNP 3,012.0*    CBG: Recent Labs  Lab 07/14/21 1202 07/14/21 1626 07/14/21 2034 07/15/21 0755 07/15/21 1148  GLUCAP 146* 211* 174* 222* 136*       Signed:  Kayleen Memos, MD [facility] 07/15/2021, 12:14 PM

## 2021-07-15 NOTE — TOC Transition Note (Signed)
Transition of Care [facility]) - CM/SW Discharge Note   Patient Details  Name: Amado Andal MRN: [id number] Date of Birth: Dec 25, 1960  Transition of Care [facility]) CM/SW Contact:  Delilah Shan, LCSWA Phone Number: 07/15/2021, 3:51 PM   Clinical Narrative:     Patient will DC to: Home- [address] [place] [address]  Anticipated DC date: 07/15/2021  Family notified:Patient declined- Informed CSW he has already informed family.  Transport by: Maretta Los  ?  Per MD patient ready for DC to Home. [employer] requested for patient.  CSW signing off.    Final next level of care: Home w Home Health Services Barriers to Discharge: Continued Medical Work up   Patient Goals and CMS Choice Patient states their goals for this hospitalization and ongoing recovery are:: patient wants to return home      Discharge Placement                       Discharge Plan and Services In-house Referral: NA Discharge Planning Services: CM Consult Post Acute Care Choice: Home Health          DME Arranged: N/A DME Agency: NA       HH Arranged: RN, Disease Management, Social Work          Social Determinants of Health (SDOH) Interventions     Readmission Risk Interventions Readmission Risk Prevention Plan 07/14/2021  Transportation Screening Complete  HRI or Home Care Consult Complete  Social Work Consult for Recovery Care Planning/Counseling Complete  Palliative Care Screening Not Applicable  Medication Review Oceanographer) Complete  Some recent data might be hidden

## 2021-07-15 NOTE — Plan of Care (Signed)

## 2021-07-16 ENCOUNTER — Telehealth: Payer: Self-pay

## 2021-07-16 ENCOUNTER — Other Ambulatory Visit (HOSPITAL_COMMUNITY): Payer: Self-pay

## 2021-07-16 ENCOUNTER — Telehealth (HOSPITAL_COMMUNITY): Payer: Self-pay

## 2021-07-16 NOTE — Telephone Encounter (Signed)
Transitions of Care Pharmacy   Call attempted for a pharmacy transitions of care follow-up. Unable to reach patient, no voicemail.  Call attempt #1. Will follow-up in 2-3 days.

## 2021-07-16 NOTE — Telephone Encounter (Addendum)
Transition Care Management Unsuccessful Follow-up Telephone Call  Date of discharge and from where:  TCM DC [facility] 07-15-21 Dx: CHF   Attempts:  1st Attempt  Reason for unsuccessful TCM follow-up call:  Unable to leave message  Transition Care Management Unsuccessful Follow-up Telephone Call  Date of discharge and from where:  TCM DC [facility] 07-15-21 Dx: CHF   Attempts:  2nd Attempt  Reason for unsuccessful TCM follow-up call:  Left voice message  Transition Care Management Unsuccessful Follow-up Telephone Call  Date of discharge and from where:  TCM DC [facility] 07-15-21 Dx: CHF  Attempts:  3rd Attempt  Reason for unsuccessful TCM follow-up call:  Unable to leave message

## 2021-07-20 ENCOUNTER — Telehealth: Payer: Self-pay

## 2021-07-20 ENCOUNTER — Telehealth (HOSPITAL_COMMUNITY): Payer: Self-pay

## 2021-07-20 NOTE — Telephone Encounter (Signed)
[facility] called in to get orders approved. Advised orders cannot be approved by Dr. [name] since pt has not been seen since 01/2021. Patient has no-showed his scheduled appointments & hasn't follow up after hospital discharge.

## 2021-07-20 NOTE — Telephone Encounter (Signed)
Transitions of Care Pharmacy   Call attempted for a pharmacy transitions of care follow-up. Unable to leave voicemail.   Call attempt #2. Will follow-up in 2-3 days.    

## 2021-07-21 ENCOUNTER — Telehealth (HOSPITAL_COMMUNITY): Payer: Self-pay

## 2021-07-21 NOTE — Telephone Encounter (Signed)
Transitions of Care Pharmacy   Call attempted for a pharmacy transitions of care follow-up. Unable to leave voicemail.  Call attempt #3. Will no longer attempt follow up for TOC pharmacy   

## 2021-07-24 ENCOUNTER — Telehealth (HOSPITAL_COMMUNITY): Payer: Self-pay

## 2021-07-24 NOTE — Telephone Encounter (Signed)
Called and was un able to leave patient a voice message to confirm/remind patient of [facility] 07[facility].  ? ? ?

## 2021-07-27 ENCOUNTER — Encounter (HOSPITAL_COMMUNITY): Payer: Medicare Other

## 2021-07-28 NOTE — Progress Notes (Deleted)
? ? ?HPI: ? ?[name] is a 61 y.o. male, who is here today to follow on recent hospitalization.  ? ?Review of Systems ?Rest see pertinent positives and negatives per HPI. ? ?Current Outpatient Medications on File Prior to Visit  ?Medication Sig Dispense Refill  ? aspirin 81 MG EC tablet Take 1 tablet (81 mg total) by mouth daily. 360 tablet 0  ? atorvastatin (LIPITOR) 80 MG tablet Take 1 tablet (80 mg total) by mouth daily. 90 tablet 0  ? carvedilol (COREG) 6.25 MG tablet Take 1 tablet (6.25 mg total) by mouth EVERY 12 HOURS (10AM &10PM) 180 tablet 0  ? clopidogrel (PLAVIX) 75 MG tablet Take 1 tablet (75 mg total) by mouth daily. 90 tablet 0  ? furosemide (LASIX) 40 MG tablet Take 1 tablet (40 mg total) by mouth daily. 90 tablet 0  ? hydrALAZINE (APRESOLINE) 50 MG tablet Take 1 tablet (50 mg total) by mouth 3 (three) times daily. 270 tablet 0  ? polyethylene glycol powder (GLYCOLAX/MIRALAX) 17 GM/SCOOP powder Take 1 capful (17 g) with water by mouth daily. 238 g 0  ? potassium chloride (KLOR-CON M) 10 MEQ tablet Take 1 tablet (10 mEq total) by mouth daily. 90 tablet 0  ? sacubitril-valsartan (ENTRESTO) 24-26 MG Take 1 tablet by mouth 2 (two) times daily. 180 tablet 0  ? sitaGLIPtin (JANUVIA) 25 MG tablet Take 1 tablet (25 mg total) by mouth daily. 90 tablet 0  ? spironolactone (ALDACTONE) 25 MG tablet Take 1 tablet (25 mg total) by mouth daily. 90 tablet 0  ? ?No current facility-administered medications on file prior to visit.  ? ? ?Past Medical History:  ?Diagnosis Date  ? CHF (congestive heart failure) (HCC)   ? Coronary artery disease   ? Diabetes mellitus without complication (HCC)   ? History of MI (myocardial infarction) 03/05/2019  ? Hypertension   ? Stroke [facility])   ? ?Allergies  ?Allergen Reactions  ? Bee Venom   ?  swelling  ? ? ?Social History  ? ?Socioeconomic History  ? Marital status: Single  ?  Spouse name: Not on file  ? Number of children: Not on file  ? Years of education: Not on file  ?  Highest education level: Not on file  ?Occupational History  ? Not on file  ?Tobacco Use  ? Smoking status: Former  ?  Types: Cigarettes  ?  Quit date: 10/06/2017  ?  Years since quitting: 3.8  ? Smokeless tobacco: Never  ?Substance and Sexual Activity  ? Alcohol use: Not Currently  ? Drug use: Yes  ?  Types: Marijuana  ? Sexual activity: Not on file  ?Other Topics Concern  ? Not on file  ?Social History Narrative  ? Not on file  ? ?Social Determinants of Health  ? ?Financial Resource Strain: Low Risk   ? Difficulty of Paying Living Expenses: Not very hard  ?Food Insecurity: No Food Insecurity  ? Worried About Programme researcher, broadcasting/film/video in the Last Year: Never true  ? Ran Out of Food in the Last Year: Never true  ?Transportation Needs: No Transportation Needs  ? Lack of Transportation (Medical): No  ? Lack of Transportation (Non-Medical): No  ?Physical Activity: Not on file  ?Stress: Not on file  ?Social Connections: Not on file  ? ? ?There were no vitals filed for this visit. ?There is no height or weight on file to calculate BMI. ? ?Physical Exam ? ?ASSESSMENT AND PLAN: ? ? ?There are no  diagnoses linked to this encounter. ? ?No orders of the defined types were placed in this encounter. ? ? ?No problem-specific Assessment & Plan notes found for this encounter. ? ? ?No follow-ups on file. ? ? ?[name]. [name], MD ? ?[facility]. ?[place] office. ? ? ? ? ? ? ? ? ? ? ? ? ? ? ? ?

## 2021-07-29 ENCOUNTER — Ambulatory Visit: Payer: Medicare Other | Admitting: Family Medicine

## 2021-07-31 ENCOUNTER — Telehealth (HOSPITAL_COMMUNITY): Payer: Self-pay

## 2021-07-31 NOTE — Progress Notes (Incomplete)
ADVANCED HF CLINIC CONSULT NOTE   Primary Care: Thomas, [name], MD HF Cardiologist: Dr. Haroldine Laws  HPI: [name] is a 61 year old male with history of CAD with prior MI and stent in 01/2019 at [facility] in [place] (report not available), cardiomyopathy/chronic systolic HF, uncontrolled DM II, HTN, hx CVA on chart review, CKD.    Moved to [place] from [place] 2 years ago. Had not medical f/u until he was admitted in 93/79 with a/c systolic HF. Had been out of cardiac medications. He diuresed with IV lasix then transitioned to po lasix 40 mg daily at discharge. Started on GDMT with carvedilol, hydralazine, imdur and empagliflozin.    Echo 10/22 with EF 20-25%, RV okay, trivial MR   [facility] 10/22 with nonobstructive CAD and patent RPLV stent. RA mean 5 mmHg, PCWP mean 5 mmHg, Fick CO 7.46/CI 3.85.   He did not show up for [facility] appointment after discharge. Presented to ED this am with worsening dyspnea, exertional chest tightness and LE edema X 3 weeks.  Dyspneic with minimal exertion progressing to dyspnea at rest. BP uncontrolled on presentation, 172/119 mmHg. BNP 3,306 (3,000 10/22), Scr 1.50 (baseline 1.5), HS troponin negative X 2. Alk phos 204, AST and ALT WNL. ECG with sinus tachycardia, rate 109 bpm, PVCs. Chest x-ray with evidence of CHG. Given 60 mg lasix IV. He was admitted to hospitalist service. GDMT restarted including carvedilol, hydralazine and imdur. Now diuresing with IV lasix 60 mg BID.   Patient reports he has filled up 3 urinals since arrival to the ED. Dyspnea and leg edema starting to improve.   In chart review, there has been concern for noncompliance with medications. He is adamant he has been taking his medications and reports his sister assists him with them. Drinks 3 bottles of water and several cups of juice every day.    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 Has been on disability the last few years due to medical conditions. Lives with his son.    Denies prior history of illicit drug use. States he has not consumed alcohol or smoked cigarettes in several years.      Review of Systems: [y] = yes, _0  = no   General: Weight gain _1 ; Weight loss _2 ; Anorexia _3 ; Fatigue _4 ; Fever _5 ; Chills _6 ; Weakness _7   Cardiac: Chest pain/pressure _8 ; Resting SOB _9 ; Exertional SOB _10 ; Orthopnea _11 ; Pedal Edema _12 ; Palpitations _13 ; Syncope _14 ; Presyncope _15 ; Paroxysmal nocturnal dyspnea_16   Pulmonary: Cough _17 ; Wheezing_18 ; Hemoptysis_19 ; Sputum _20 ; Snoring _21   GI: Vomiting_22 ; Dysphagia_23 ; Melena_24 ; Hematochezia _25 ; Heartburn_26 ; Abdominal pain _27 ; Constipation _28 ; Diarrhea _29 ; BRBPR _30   GU: Hematuria_31 ; Dysuria _32 ; Nocturia_33   Vascular: Pain in legs with walking _34 ; Pain in feet with lying flat _35 ; Non-healing sores _36 ; Stroke _37 ; TIA _38 ; Slurred speech _39 ;  Neuro: Headaches_40 ; Vertigo_41 ; Seizures_42 ; Paresthesias_43 ;Blurred vision _44 ; Diplopia _45 ; Vision changes _46   Ortho/Skin: Arthritis _47 ; Joint pain _48 ; Muscle pain _49 ; Joint swelling _50 ; Back Pain _51 ; Rash _52   Psych: Depression_53 ; Anxiety_54   Heme: Bleeding problems _55 ; Clotting disorders _56 ; Anemia _57   Endocrine: Diabetes _58 ; Thyroid dysfunction_59   Past Medical History:  Diagnosis Date   CHF (congestive heart failure) (HCC)    Coronary artery disease    Diabetes mellitus without complication (HCC)    History of MI (myocardial infarction) 03/05/2019   Hypertension    Stroke [facility])     Current Outpatient Medications  Medication Sig Dispense Refill   aspirin 81 MG EC tablet Take 1 tablet (81 mg total) by mouth daily. 360 tablet 0   atorvastatin (LIPITOR) 80 MG tablet Take 1 tablet (80 mg total) by mouth daily. 90 tablet 0   carvedilol (COREG) 6.25 MG tablet Take 1 tablet (6.25 mg total) by mouth EVERY 12 HOURS (10AM &10PM) 180 tablet 0   clopidogrel (PLAVIX) 75 MG tablet Take 1 tablet (75 mg total) by mouth daily. 90 tablet 0    furosemide (LASIX) 40 MG tablet Take 1 tablet (40 mg total) by mouth daily. 90 tablet 0   hydrALAZINE (APRESOLINE) 50 MG tablet Take 1 tablet (50 mg total) by mouth 3 (three) times daily. 270 tablet 0   potassium chloride (KLOR-CON M) 10 MEQ tablet Take 1 tablet (10 mEq total) by mouth daily. 90 tablet 0   sacubitril-valsartan (ENTRESTO) 24-26 MG Take 1 tablet by mouth 2 (two) times daily. 180 tablet 0   sitaGLIPtin (JANUVIA) 25 MG tablet Take 1 tablet (25 mg total) by mouth daily. 90 tablet 0   spironolactone (ALDACTONE) 25 MG tablet Take 1 tablet (25 mg total) by mouth daily. 90 tablet 0   No current facility-administered medications for this visit.    Allergies  Allergen Reactions   Bee Venom     swelling      Social History   Socioeconomic History   Marital status: Single    Spouse name: Not on file   Number of children: Not on file   Years of education: Not on file   Highest education level: Not on file  Occupational History   Not on file  Tobacco Use   Smoking status: Former    Types: Cigarettes    Quit date: 10/06/2017    Years since quitting: 3.8   Smokeless tobacco: Never  Substance and Sexual Activity   Alcohol use: Not Currently   Drug use: Yes    Types: Marijuana   Sexual activity: Not on file  Other Topics Concern   Not on file  Social History Narrative   Not on file   Social Determinants of Health   Financial Resource Strain: Low Risk    Difficulty of Paying Living Expenses: Not very hard  Food Insecurity: No Food Insecurity   Worried About Running Out of Food in the Last Year: Never true   Ran Out of Food in the Last Year: Never true  Transportation Needs: No Transportation Needs   Lack of Transportation (Medical): No   Lack of Transportation (Non-Medical): No  Physical Activity: Not on file  Stress: Not on file  Social Connections: Not on file  Intimate Partner Violence: Not on file     No family history on file.  There were no vitals filed  for this visit.  PHYSICAL EXAM: General:  Well appearing. No respiratory difficulty HEENT: normal Neck: supple. no JVD. Carotids 2+ bilat; no bruits. No lymphadenopathy or thryomegaly appreciated. Cor: PMI nondisplaced. Regular rate & rhythm. No rubs, gallops or murmurs. Lungs: clear Abdomen: soft, nontender, nondistended. No hepatosplenomegaly. No bruits or masses. Good bowel sounds. Extremities: no cyanosis, clubbing, rash, edema Neuro: alert & oriented x 3, cranial nerves grossly  intact. moves all 4 extremities w/o difficulty. Affect pleasant.  ECG:   ASSESSMENT & PLAN: Acute on chronic systolic HF/cardiomyopathy: -Onset not certain. He reports cardiomyopathy diagnosed just prior to MI while living in [place] a couple of years ago. -Echo 10/22 with EF 20-25%, RV okay -R/LHC 10/22: Patent RPLV stent with nonobstructive disease elsewhere, Preserved CO/CI, RA mean 5 mmHg, PCWP mean 5 mmHg - Etiology not certain. ? If due to frequent PVCs and/or uncontrolled hypertension. Denies hx drug abuse. Reports alcohol use in past, but none last few years.  - Now readmitted with a/c HF. He is adamant he has been adherent with medications and reports his sister assists him with organizing them.  - NYHA 3b.  - CO-OX remains stable. CVP 6 .  - Continue Entresto 24-26 mg bid - Remain off SGLT2i d/t A1c 12 - Continue Carvedilol 6.25 mg bid  - C/w Hydral/Imdur  - Increase spiro to 25 mg daily.  - Renal function stable.  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 Myeloma panel drawn 04/23/21  - PYP ordred.   -   2. PVCs: -Occasional PVCs.  -Keep K > 4, Mag > 2   3. Chest tightness: - Suspect demand ischemia with volume overload.  - HS troponin negative X 2 - Recent cath with patent stent and no new obstructive disease.    4. CAD: - Prior MI followed by PCI in [place] in either 2019 or 2020.  - Patent RPLV stent on [facility] 10/22. Also had 20% distal RCA,  30% p LAD, 70% d LAD, 60% OM2 - No chest pain.  - On aspirin, plavix - Continue atorvastatin 80 mg daily   5. HTN: - Stable.  - Noncompliance a concern but reports taking all of his medicines. Sister arranges medicines per his report but does not know any of the names   6. Uncontrolled DM: - per TRH - would avoid SGLT2i for now as a1c is 12    7. Elevated alk phos -204 on 11/27, AST/ALT normal -[place] with RUQ ascites, otherwise negative   8. Left heel pressure ulcer, DTI - Continue to offload when in bed.    9. Hypomagnesemia  Last mag 2.1 .   Myeloma panel drawn 04/23/21 . PY scan today.    He does not want HH or Paramedicine.    If he is discharged today he will need transportation and all medications.    HF F/U 04/30/21 at 9:30    Length of Stay: [place], NP  04/24/2021, 9:03 AM

## 2021-07-31 NOTE — Telephone Encounter (Signed)
Called to confirm/remind patient of their appointment at the [facility] on 08/03/21.  ? ?Patient reminded to bring all medications and/or complete list. ? ?Confirmed patient has transportation. Gave directions, instructed to utilize valet parking. ? ?Confirmed appointment prior to ending call.  ? ?

## 2021-08-03 ENCOUNTER — Emergency Department (HOSPITAL_COMMUNITY): Payer: Medicare Other

## 2021-08-03 ENCOUNTER — Encounter (HOSPITAL_COMMUNITY): Payer: Self-pay | Admitting: Emergency Medicine

## 2021-08-03 ENCOUNTER — Encounter (HOSPITAL_COMMUNITY): Payer: Medicare Other

## 2021-08-03 ENCOUNTER — Other Ambulatory Visit (HOSPITAL_COMMUNITY): Payer: Self-pay

## 2021-08-03 ENCOUNTER — Emergency Department (HOSPITAL_COMMUNITY)
Admission: EM | Admit: 2021-08-03 | Discharge: 2021-08-03 | Disposition: A | Discharge status: Home / Self Care | Payer: Medicare Other | Attending: Emergency Medicine | Admitting: Emergency Medicine

## 2021-08-03 ENCOUNTER — Other Ambulatory Visit: Payer: Self-pay

## 2021-08-03 DIAGNOSIS — I130 Hypertensive heart and chronic kidney disease with heart failure and stage 1 through stage 4 chronic kidney disease, or unspecified chronic kidney disease: Secondary | ICD-10-CM | POA: Diagnosis not present

## 2021-08-03 DIAGNOSIS — N182 Chronic kidney disease, stage 2 (mild): Secondary | ICD-10-CM | POA: Insufficient documentation

## 2021-08-03 DIAGNOSIS — I2510 Atherosclerotic heart disease of native coronary artery without angina pectoris: Secondary | ICD-10-CM | POA: Diagnosis not present

## 2021-08-03 DIAGNOSIS — Z79899 Other long term (current) drug therapy: Secondary | ICD-10-CM | POA: Diagnosis not present

## 2021-08-03 DIAGNOSIS — G8929 Other chronic pain: Secondary | ICD-10-CM | POA: Diagnosis not present

## 2021-08-03 DIAGNOSIS — Z7984 Long term (current) use of oral hypoglycemic drugs: Secondary | ICD-10-CM | POA: Insufficient documentation

## 2021-08-03 DIAGNOSIS — Z7902 Long term (current) use of antithrombotics/antiplatelets: Secondary | ICD-10-CM | POA: Diagnosis not present

## 2021-08-03 DIAGNOSIS — I509 Heart failure, unspecified: Secondary | ICD-10-CM | POA: Diagnosis not present

## 2021-08-03 DIAGNOSIS — M545 Low back pain, unspecified: Secondary | ICD-10-CM | POA: Diagnosis not present

## 2021-08-03 DIAGNOSIS — S3992XA Unspecified injury of lower back, initial encounter: Secondary | ICD-10-CM | POA: Diagnosis not present

## 2021-08-03 DIAGNOSIS — M5126 Other intervertebral disc displacement, lumbar region: Secondary | ICD-10-CM | POA: Diagnosis not present

## 2021-08-03 DIAGNOSIS — Z7982 Long term (current) use of aspirin: Secondary | ICD-10-CM | POA: Insufficient documentation

## 2021-08-03 DIAGNOSIS — M5459 Other low back pain: Secondary | ICD-10-CM | POA: Diagnosis not present

## 2021-08-03 DIAGNOSIS — M549 Dorsalgia, unspecified: Secondary | ICD-10-CM | POA: Diagnosis not present

## 2021-08-03 DIAGNOSIS — I723 Aneurysm of iliac artery: Secondary | ICD-10-CM | POA: Diagnosis not present

## 2021-08-03 DIAGNOSIS — E1122 Type 2 diabetes mellitus with diabetic chronic kidney disease: Secondary | ICD-10-CM | POA: Diagnosis not present

## 2021-08-03 MED ORDER — OXYCODONE-ACETAMINOPHEN 5-325 MG PO TABS
1.0000 | ORAL_TABLET | Freq: Four times a day (QID) | ORAL | 0 refills | Status: AC | PRN
  Filled 2021-08-03: qty 10, 3d supply, fill #0

## 2021-08-03 MED ORDER — OXYCODONE-ACETAMINOPHEN 5-325 MG PO TABS
1.0000 | ORAL_TABLET | Freq: Once | ORAL | Status: AC
  Administered 2021-08-03: 1 via ORAL
  Filled 2021-08-03: qty 1

## 2021-08-03 NOTE — Discharge Instructions (Addendum)
Please follow up with Dr. [name] regarding your back pain. Your CT scans were negative for fracture.  ?I have prescribed you a short course of Percocet for your pain. ?

## 2021-08-03 NOTE — ED Notes (Signed)
Patient provided with bus pass to go home. Advised patient that there were limited quantities of cab vouchers as he says that is what they always give him Patient denies any contacts to come get him, also denies have the monetary means to get a way home. Patient states he does not know how to use bus system even when a map and bus route was provided.  ?Contacted social work who approved the use of taxi voucher for patient. ?Patient ambulatory to lobby with taxi voucher  ?

## 2021-08-03 NOTE — ED Triage Notes (Signed)
Pt BIB EMS from home with c/o back pain after falling x 2 weeks ago. Pt states that he was given percocets for his pain, he has been out x 3 days and states that his pain is back.  ?

## 2021-08-03 NOTE — ED Provider Notes (Cosign Needed)
[place] EMERGENCY DEPARTMENT Provider Note   CSN: [id number] Arrival date & time: 08/03/21  0600     History Past medical history includes type 2 diabetes, CKD stage II, hyperlipidemia, hypertension, heart failure with reduced EF, coronary artery disease, history of MI Chief Complaint  Patient presents with   Back Pain    Thomas West is a 61 y.o. male. Patient presents emergency department with some plaint of lower back pain.  He says this is in the setting of having a recent fall.  There is discrepancy of whether this fall happened 2 weeks ago versus 2 months ago.  Patient is a poor historian in this aspect.  He says that he was walking outside and slipped on some ice and fell backwards and landed on his back.  He states he was never seen specifically for this fall.  He has had persistent lower back pain ever since then.  Apparently, patient was seen for a different problem about 2 to 4 weeks ago and was given a prescription for Percocet.  Patient states that he ran out of this 3 days ago and is having worsening pain.  Of note, he does say that he has had difficulty starting his stream with urine and only small amounts are coming out at a time.  He does not feel like he is emptying his bladder completely.  He thinks this started around the time of the fall, however he is unsure if this is a chronic problem or not that is unrelated.  He denies any difficulty walking, numbness, shooting pains down his legs, saddle anesthesia, bowel or bladder incontinence, bowel retention.  Patient does state that he has a PCP appointment today at 2 PM, but came to the emergency department for pain medication since is gotten so bad.  He is concerned that he could have an underlying fracture. He is not on blood thinners. Of note, he has a history of a previous fall down the stairs sometime last year while he was living in [place]. He previously had to have physical therapy for this  incident.   Back Pain     Home Medications Prior to Admission medications   Medication Sig Start Date End Date Taking? Authorizing Provider  oxyCODONE-acetaminophen (PERCOCET/ROXICET) 5-325 MG tablet Take 1 tablet by mouth every 6 (six) hours as needed for up to 5 days for severe pain. 08/03/21 08/08/21 Yes Pranay Hilbun, Adora Fridge, PA-C  aspirin 81 MG EC tablet Take 1 tablet (81 mg total) by mouth daily. 07/15/21 07/10/22  Kayleen Memos, DO  atorvastatin (LIPITOR) 80 MG tablet Take 1 tablet (80 mg total) by mouth daily. 07/15/21 10/13/21  Kayleen Memos, DO  carvedilol (COREG) 6.25 MG tablet Take 1 tablet (6.25 mg total) by mouth EVERY 12 HOURS (10AM &10PM) 07/15/21 10/13/21  Kayleen Memos, DO  clopidogrel (PLAVIX) 75 MG tablet Take 1 tablet (75 mg total) by mouth daily. 07/15/21 10/13/21  Kayleen Memos, DO  furosemide (LASIX) 40 MG tablet Take 1 tablet (40 mg total) by mouth daily. 07/15/21 10/13/21  Kayleen Memos, DO  hydrALAZINE (APRESOLINE) 50 MG tablet Take 1 tablet (50 mg total) by mouth 3 (three) times daily. 07/15/21 10/13/21  Kayleen Memos, DO  potassium chloride (KLOR-CON M) 10 MEQ tablet Take 1 tablet (10 mEq total) by mouth daily. 07/15/21 10/13/21  Kayleen Memos, DO  sacubitril-valsartan (ENTRESTO) 24-26 MG Take 1 tablet by mouth 2 (two) times daily. 07/15/21 10/13/21  Irene Pap  N, DO  sitaGLIPtin (JANUVIA) 25 MG tablet Take 1 tablet (25 mg total) by mouth daily. 07/15/21 10/13/21  Kayleen Memos, DO  spironolactone (ALDACTONE) 25 MG tablet Take 1 tablet (25 mg total) by mouth daily. 07/15/21 10/13/21  Kayleen Memos, DO      Allergies    Bee venom    Review of Systems   Review of Systems  Genitourinary:  Positive for difficulty urinating.  Musculoskeletal:  Positive for back pain.  All other systems reviewed and are negative.  Physical Exam Updated Vital Signs BP (!) 150/96    Pulse 85    Temp 98.3 F (36.8 C)    Resp (!) 22    Ht 5\' 9"  (1.753 m)    Wt 63.4 kg    SpO2 99%    BMI 20.64  kg/m  Physical Exam Vitals and nursing note reviewed.  Constitutional:      General: He is not in acute distress.    Appearance: Normal appearance. He is well-developed. He is not ill-appearing, toxic-appearing or diaphoretic.  HENT:     Head: Normocephalic and atraumatic.     Nose: No nasal deformity.     Mouth/Throat:     Lips: Pink. No lesions.  Eyes:     General: Gaze aligned appropriately. No scleral icterus.       Right eye: No discharge.        Left eye: No discharge.     Conjunctiva/sclera: Conjunctivae normal.     Right eye: Right conjunctiva is not injected. No exudate or hemorrhage.    Left eye: Left conjunctiva is not injected. No exudate or hemorrhage. Pulmonary:     Effort: Pulmonary effort is normal. No respiratory distress.  Musculoskeletal:     Comments: Positive midline tenderness down from likely T12-L4 area.  No obvious step-offs, however patient subjectively feels a "bone protruding". There is reproducible bilateral paraspinal muscular tenderness present DP/PT pulses 2+ and equal bilaterally No leg edema Sensation grossly intact on anterior thighs, dorsum of foot and lateral foot Strength of knee flexion and extension is 5/5 Plantar and dorsiflexion of ankle 5/5   Skin:    General: Skin is warm and dry.  Neurological:     Mental Status: He is alert and oriented to person, place, and time.  Psychiatric:        Mood and Affect: Mood normal.        Speech: Speech normal.        Behavior: Behavior normal. Behavior is cooperative.    ED Results / Procedures / Treatments   Labs (all labs ordered are listed, but only abnormal results are displayed) Labs Reviewed - No data to display  EKG None  Radiology CT Thoracic Spine Wo Contrast  Result Date: 08/03/2021 CLINICAL DATA:  Back trauma, no prior imaging (Age >= 16y); fell down a flight of steps with back pain EXAM: CT THORACIC AND LUMBAR SPINE WITHOUT CONTRAST TECHNIQUE: Multidetector CT imaging of the  thoracic and lumbar spine was performed without contrast. Multiplanar CT image reconstructions were also generated. RADIATION DOSE REDUCTION: This exam was performed according to the departmental dose-optimization program which includes automated exposure control, adjustment of the mA and/or kV according to patient size and/or use of iterative reconstruction technique. COMPARISON:  None. FINDINGS: CT THORACIC SPINE Alignment: Preserved. Vertebrae: Vertebral body heights are maintained. No acute fracture. Paraspinal and other soft tissues: No acute abnormality. Disc levels: Intervertebral disc heights are maintained. Right ligamentum flavum thickening and calcification at  T9-T10. No significant degenerative stenosis. CT LUMBAR SPINE Segmentation: 5 lumbar type vertebrae. Alignment: Preserved. Vertebrae: Vertebral body heights are maintained. No acute fracture. Paraspinal and other soft tissues: No acute abnormality. Atherosclerosis. 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 Disc levels: Intervertebral disc heights are maintained. No canal stenosis. Small right foraminal protrusion at L3-L4. IMPRESSION: No acute fracture of the thoracolumbar spine. Mild aneurysmal dilatation of the right common iliac artery. Electronically Signed   By: Macy Mis M.D.   On: 08/03/2021 07:43   CT Lumbar Spine Wo Contrast  Result Date: 08/03/2021 CLINICAL DATA:  Back trauma, no prior imaging (Age >= 16y); fell down a flight of steps with back pain EXAM: CT THORACIC AND LUMBAR SPINE WITHOUT CONTRAST TECHNIQUE: Multidetector CT imaging of the thoracic and lumbar spine was performed without contrast. Multiplanar CT image reconstructions were also generated. RADIATION DOSE REDUCTION: This exam was performed according to the departmental dose-optimization program which includes automated exposure control, adjustment of the mA and/or kV according to patient size and/or use of iterative reconstruction technique.  COMPARISON:  None. FINDINGS: CT THORACIC SPINE Alignment: Preserved. Vertebrae: Vertebral body heights are maintained. No acute fracture. Paraspinal and other soft tissues: No acute abnormality. Disc levels: Intervertebral disc heights are maintained. Right ligamentum flavum thickening and calcification at T9-T10. No significant degenerative stenosis. CT LUMBAR SPINE Segmentation: 5 lumbar type vertebrae. Alignment: Preserved. Vertebrae: Vertebral body heights are maintained. No acute fracture. Paraspinal and other soft tissues: No acute abnormality. Atherosclerosis. 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 Disc levels: Intervertebral disc heights are maintained. No canal stenosis. Small right foraminal protrusion at L3-L4. IMPRESSION: No acute fracture of the thoracolumbar spine. Mild aneurysmal dilatation of the right common iliac artery. Electronically Signed   By: Macy Mis M.D.   On: 08/03/2021 07:43    Procedures Procedures   Medications Ordered in ED Medications  oxyCODONE-acetaminophen (PERCOCET/ROXICET) 5-325 MG per tablet 1 tablet (1 tablet Oral Given 08/03/21 [id number])    ED Course/ Medical Decision Making/ A&P                           Medical Decision Making Amount and/or Complexity of Data Reviewed Radiology: ordered.  Risk Prescription drug management.    MDM  This is a 61 y.o. male who presents to the ED with lower back pain in the setting of a subacute trauma to the area.  Is unclear the exact timeline of the injury as triage notes states that it happened 2 weeks ago and patient's states that it happened 1 to 2 months ago and then also states that happened in December. This patient does have a reported history of difficulty urinating, however difficult to discern whether this is a chronic problem versus related to the injury.  Will obtain postvoid bladder scan to assess for retention.  Otherwise, patient has no subjective red flag symptoms.  I doubt that this  is cauda equina or cord compression.  I want to rule out a compression fracture so will obtain CT scan.  In the setting of subacute trauma, is unlikely this is AAA rupture, kidney stone, or pyelonephritis.  Until we get bladder scan, will hold off on MRI images.  I personally ordered, reviewed, and interpreted all laboratory work and imaging and agree with radiologist interpretation. Results interpreted below: CT with no acute fracture of thoracic or lumbar spine.  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 Likely incidental finding. Patient can  follow up with PCP regarding this. Bladder scan ~ 200 cc prevoid. Unfortunately, we did not get residual amount as patient did not feel like he had to go.  On reassessment, pain improved after medication. I do not think that patient is acutely retaining urine from a spinal cord process given that he only had 200 cc in bladder and this was pre void. At this point, I would recommend follow up with PCP regarding back pain and consideration of outpatient MRI and physical therapy. I will give him a short pain medication supply, but he will need to establish better pain plan with PCP going forward.    Charting Requirements Additional history is obtained from:  Independent historian External Records from outside source obtained and reviewed including: Recent hospitalization for CHF Social Determinants of Health:  Poor health literacy and Access to medical care Pertinant PMH that complicates patient's illness: ype 2 diabetes, CKD stage II, hyperlipidemia, hypertension, heart failure with reduced EF, coronary artery disease, history of MI  Patient Care Problems that were addressed during this visit: - Lower back pain: Chronic illness Medications given in ED: Percocet Reevaluation of the patient after these medicines showed that the patient improved Disposition: pain meds. F/u with PCP  Portions of this note were generated with Dragon dictation  software. Dictation errors may occur despite best attempts at proofreading.   Final Clinical Impression(s) / ED Diagnoses Final diagnoses:  Chronic midline low back pain without sciatica    Rx / DC Orders ED Discharge Orders          Ordered    oxyCODONE-acetaminophen (PERCOCET/ROXICET) 5-325 MG tablet  Every 6 hours PRN        08/03/21 0830              Rhiann Boucher, Adora Fridge, PA-C 08/03/21 [phone]

## 2021-08-18 ENCOUNTER — Other Ambulatory Visit: Payer: Self-pay

## 2021-08-18 ENCOUNTER — Emergency Department (HOSPITAL_COMMUNITY)
Admission: EM | Admit: 2021-08-18 | Discharge: 2021-08-18 | Disposition: A | Discharge status: Home / Self Care | Payer: Medicare Other | Attending: Emergency Medicine | Admitting: Emergency Medicine

## 2021-08-18 ENCOUNTER — Other Ambulatory Visit (HOSPITAL_COMMUNITY): Payer: Self-pay

## 2021-08-18 ENCOUNTER — Encounter (HOSPITAL_COMMUNITY): Payer: Self-pay | Admitting: Emergency Medicine

## 2021-08-18 ENCOUNTER — Emergency Department (HOSPITAL_COMMUNITY): Payer: Medicare Other

## 2021-08-18 DIAGNOSIS — E1140 Type 2 diabetes mellitus with diabetic neuropathy, unspecified: Secondary | ICD-10-CM | POA: Diagnosis not present

## 2021-08-18 DIAGNOSIS — M546 Pain in thoracic spine: Secondary | ICD-10-CM | POA: Insufficient documentation

## 2021-08-18 DIAGNOSIS — I509 Heart failure, unspecified: Secondary | ICD-10-CM | POA: Insufficient documentation

## 2021-08-18 DIAGNOSIS — N189 Chronic kidney disease, unspecified: Secondary | ICD-10-CM | POA: Diagnosis not present

## 2021-08-18 DIAGNOSIS — E1165 Type 2 diabetes mellitus with hyperglycemia: Secondary | ICD-10-CM | POA: Diagnosis not present

## 2021-08-18 DIAGNOSIS — R0602 Shortness of breath: Secondary | ICD-10-CM | POA: Insufficient documentation

## 2021-08-18 DIAGNOSIS — R200 Anesthesia of skin: Secondary | ICD-10-CM | POA: Diagnosis not present

## 2021-08-18 DIAGNOSIS — Z7902 Long term (current) use of antithrombotics/antiplatelets: Secondary | ICD-10-CM | POA: Diagnosis not present

## 2021-08-18 DIAGNOSIS — M549 Dorsalgia, unspecified: Secondary | ICD-10-CM | POA: Diagnosis not present

## 2021-08-18 DIAGNOSIS — E1142 Type 2 diabetes mellitus with diabetic polyneuropathy: Secondary | ICD-10-CM | POA: Diagnosis not present

## 2021-08-18 DIAGNOSIS — M545 Low back pain, unspecified: Secondary | ICD-10-CM | POA: Diagnosis not present

## 2021-08-18 DIAGNOSIS — E1122 Type 2 diabetes mellitus with diabetic chronic kidney disease: Secondary | ICD-10-CM | POA: Diagnosis not present

## 2021-08-18 DIAGNOSIS — I130 Hypertensive heart and chronic kidney disease with heart failure and stage 1 through stage 4 chronic kidney disease, or unspecified chronic kidney disease: Secondary | ICD-10-CM | POA: Insufficient documentation

## 2021-08-18 DIAGNOSIS — R7989 Other specified abnormal findings of blood chemistry: Secondary | ICD-10-CM | POA: Insufficient documentation

## 2021-08-18 DIAGNOSIS — R202 Paresthesia of skin: Secondary | ICD-10-CM | POA: Insufficient documentation

## 2021-08-18 DIAGNOSIS — R079 Chest pain, unspecified: Secondary | ICD-10-CM | POA: Diagnosis not present

## 2021-08-18 DIAGNOSIS — R319 Hematuria, unspecified: Secondary | ICD-10-CM | POA: Diagnosis not present

## 2021-08-18 DIAGNOSIS — R197 Diarrhea, unspecified: Secondary | ICD-10-CM

## 2021-08-18 DIAGNOSIS — Z7984 Long term (current) use of oral hypoglycemic drugs: Secondary | ICD-10-CM | POA: Diagnosis not present

## 2021-08-18 DIAGNOSIS — R0789 Other chest pain: Secondary | ICD-10-CM | POA: Diagnosis not present

## 2021-08-18 DIAGNOSIS — Z20822 Contact with and (suspected) exposure to covid-19: Secondary | ICD-10-CM | POA: Diagnosis not present

## 2021-08-18 DIAGNOSIS — R944 Abnormal results of kidney function studies: Secondary | ICD-10-CM | POA: Diagnosis not present

## 2021-08-18 DIAGNOSIS — Z79899 Other long term (current) drug therapy: Secondary | ICD-10-CM | POA: Insufficient documentation

## 2021-08-18 DIAGNOSIS — R195 Other fecal abnormalities: Secondary | ICD-10-CM | POA: Insufficient documentation

## 2021-08-18 DIAGNOSIS — Z7982 Long term (current) use of aspirin: Secondary | ICD-10-CM | POA: Diagnosis not present

## 2021-08-18 DIAGNOSIS — I10 Essential (primary) hypertension: Secondary | ICD-10-CM | POA: Diagnosis not present

## 2021-08-18 LAB — CBC WITH DIFFERENTIAL/PLATELET
Abs Immature Granulocytes: 0.02 10*3/uL (ref 0.00–0.07)
Basophils Absolute: 0.1 10*3/uL (ref 0.0–0.1)
Basophils Relative: 1 %
Eosinophils Absolute: 0.7 10*3/uL — ABNORMAL HIGH (ref 0.0–0.5)
Eosinophils Relative: 9 %
HCT: 40.6 % (ref 39.0–52.0)
Hemoglobin: 13.5 g/dL (ref 13.0–17.0)
Immature Granulocytes: 0 %
Lymphocytes Relative: 25 %
Lymphs Abs: 1.8 10*3/uL (ref 0.7–4.0)
MCH: 27.5 pg (ref 26.0–34.0)
MCHC: 33.3 g/dL (ref 30.0–36.0)
MCV: 82.7 fL (ref 80.0–100.0)
Monocytes Absolute: 0.7 10*3/uL (ref 0.1–1.0)
Monocytes Relative: 10 %
Neutro Abs: 4 10*3/uL (ref 1.7–7.7)
Neutrophils Relative %: 55 %
Platelets: 295 10*3/uL (ref 150–400)
RBC: 4.91 MIL/uL (ref 4.22–5.81)
RDW: 17.9 % — ABNORMAL HIGH (ref 11.5–15.5)
WBC: 7.3 10*3/uL (ref 4.0–10.5)
nRBC: 0 % (ref 0.0–0.2)

## 2021-08-18 LAB — URINALYSIS, ROUTINE W REFLEX MICROSCOPIC
Bilirubin Urine: NEGATIVE
Glucose, UA: NEGATIVE mg/dL
Hgb urine dipstick: NEGATIVE
Ketones, ur: NEGATIVE mg/dL
Leukocytes,Ua: NEGATIVE
Nitrite: NEGATIVE
Protein, ur: 100 mg/dL — AB
Specific Gravity, Urine: 1.011 (ref 1.005–1.030)
pH: 5 (ref 5.0–8.0)

## 2021-08-18 LAB — RESP PANEL BY RT-PCR (FLU A&B, COVID) ARPGX2
Influenza A by PCR: NEGATIVE
Influenza B by PCR: NEGATIVE
SARS Coronavirus 2 by RT PCR: NEGATIVE

## 2021-08-18 LAB — COMPREHENSIVE METABOLIC PANEL
ALT: 16 U/L (ref 0–44)
AST: 21 U/L (ref 15–41)
Albumin: 3.4 g/dL — ABNORMAL LOW (ref 3.5–5.0)
Alkaline Phosphatase: 85 U/L (ref 38–126)
Anion gap: 6 (ref 5–15)
BUN: 20 mg/dL (ref 6–20)
CO2: 29 mmol/L (ref 22–32)
Calcium: 8.9 mg/dL (ref 8.9–10.3)
Chloride: 106 mmol/L (ref 98–111)
Creatinine, Ser: 1.42 mg/dL — ABNORMAL HIGH (ref 0.61–1.24)
GFR, Estimated: 57 mL/min — ABNORMAL LOW (ref >60)
Glucose, Bld: 150 mg/dL — ABNORMAL HIGH (ref 70–99)
Potassium: 4.2 mmol/L (ref 3.5–5.1)
Sodium: 141 mmol/L (ref 135–145)
Total Bilirubin: 0.7 mg/dL (ref 0.3–1.2)
Total Protein: 6.9 g/dL (ref 6.5–8.1)

## 2021-08-18 LAB — BRAIN NATRIURETIC PEPTIDE: B Natriuretic Peptide: 327 pg/mL — ABNORMAL HIGH (ref 0.0–100.0)

## 2021-08-18 LAB — TROPONIN I (HIGH SENSITIVITY)
Troponin I (High Sensitivity): 27 ng/L — ABNORMAL HIGH (ref <18)
Troponin I (High Sensitivity): 26 ng/L — ABNORMAL HIGH (ref <18)

## 2021-08-18 LAB — VITAMIN B12: Vitamin B-12: 408 pg/mL (ref 180–914)

## 2021-08-18 LAB — POC OCCULT BLOOD, ED: Fecal Occult Bld: NEGATIVE

## 2021-08-18 MED ORDER — PREDNISONE 20 MG PO TABS
40.0000 mg | ORAL_TABLET | Freq: Every day | ORAL | 0 refills | Status: DC
  Filled 2021-08-18: qty 10, 5d supply, fill #0

## 2021-08-18 MED ORDER — GABAPENTIN 400 MG PO CAPS
400.0000 mg | ORAL_CAPSULE | Freq: Two times a day (BID) | ORAL | 0 refills | Status: DC
  Filled 2021-08-18: qty 60, 30d supply, fill #0

## 2021-08-18 MED ORDER — METHOCARBAMOL 500 MG PO TABS
500.0000 mg | ORAL_TABLET | Freq: Two times a day (BID) | ORAL | 0 refills | Status: DC
  Filled 2021-08-18: qty 20, 10d supply, fill #0

## 2021-08-18 MED ORDER — MORPHINE SULFATE (PF) 4 MG/ML IV SOLN
4.0000 mg | Freq: Once | INTRAVENOUS | Status: AC
  Administered 2021-08-18: 4 mg via INTRAVENOUS
  Filled 2021-08-18: qty 1

## 2021-08-18 NOTE — ED Notes (Signed)
D/c'd to lobby/ w/r. Steady gait. Speaking with wife on phone. SW at [facility] at time of d/c.  ?

## 2021-08-18 NOTE — ED Triage Notes (Signed)
Pt arrive by [facility] from home for increase SOB for the past 3 days, having some continues back pain requesting his Oxi to be prescribed again. ?

## 2021-08-18 NOTE — ED Provider Triage Note (Signed)
Emergency Medicine Provider Triage Evaluation Note ? ?Thomas West , a 61 y.o. male  was evaluated in triage.  Pt complains of dyspnea x 3 days. Constant, worse with activity, associated chest pain that is intermittent.  ? ?Review of Systems  ?Positive: Chest pain, dyspnea ?Negative: Cough, fever, leg swelling ? ?Physical Exam  ?BP (!) 152/124 (BP Location: Right Arm)   Pulse 99   Temp 98.4 ?F (36.9 ?C) (Oral)   Resp 19   Ht 5\' 9"  (1.753 m)   Wt 63.4 kg   SpO2 98%   BMI 20.64 kg/m?  ?Gen:   Awake, no distress   ?Resp:  Normal effort  ?MSK:   Moves extremities without difficulty  ?Other:  No significant lower leg edema.  ? ?Medical Decision Making  ?Medically screening exam initiated at 6:04 AM.  Appropriate orders placed.  Thomas West was informed that the remainder of the evaluation will be completed by another provider, this initial triage assessment does not replace that evaluation, and the importance of remaining in the ED until their evaluation is complete. ? ?Dyspnea ? ?Per EMS placed on O2 for comfort, was not hypoxic on their arrival--> currently saturating in the upper 90s on RA without increased work of breathing.  ?  Thomas Dyke, PA-C ?08/18/21 [id number] ? ?

## 2021-08-18 NOTE — Discharge Instructions (Signed)
Please use Tylenol or ibuprofen for pain.  You may use 600 mg ibuprofen every 6 hours or 1000 mg of Tylenol every 6 hours.  You may choose to alternate between the 2.  This would be most effective.  Not to exceed 4 g of Tylenol within 24 hours.  Not to exceed 3200 mg ibuprofen 24 hours. ? ?You can use the muscle relaxant in addition to the above, as well as please take the prednisone as prescribed for the next 5 days.  Anticipate that you may have some elevation of your blood sugar while you are taking steroids this is expected.  You can also get some lower leg swelling, increased appetite, increased energy, insomnia while taking this medication. ? ?The plans that we discussed and I recommend they follow-up on our contacting the gastroenterologist to schedule a colonoscopy, and discussed the dark stools that you been having.  I Did not see any evidence of rectal mass or rectal bleeding on my exam today.  Additionally I recommend that you follow up with orthopedics regarding your back pain, discussion of possible physical therapy. ? ?There is no evidence of fluid overload in your lungs, or damage to your heart today.  If you continue to have worsening shortness of breath, or develop chest pain please return to the emergency department or follow-up with your cardiologist. ? ?It was a pleasure taking care of you today I hope that you feel better soon. ?

## 2021-08-18 NOTE — ED Notes (Signed)
SW contacted re: transport home ?

## 2021-08-18 NOTE — ED Provider Notes (Signed)
?[facility] EMERGENCY DEPARTMENT ?Provider Note ? ? ?CSN: [id number] ?Arrival date & time: 08/18/21  3845 ? ?  ? ?History ? ?Chief Complaint  ?Patient presents with  ? Shortness of Breath  ? ? ?Thomas West is a 61 y.o. male with past medical history significant for previous MI, diabetes with neuropathy, CKD, hyperlipidemia, hypertension, heart failure who presents with multiple complaints.  Initially patient arrived with complaints of increased shortness of breath for the last 3 days, occasional intermittent chest pain which she describes as worse with inspiration.  He denies chest pain at rest.  He reports that seems like it may be associated with exertion, and describes it as sharp.  He denies any nausea, vomiting, radiation to the neck or arm.  Initially patient had a fall down the stairs about a month ago and reports continued mid back pain.  He also endorses some feet numbness and tingling.  Although he has a charted diagnosis of neuropathy patient reports that the feet numbness seems new to him.  He denies any weakness but endorses significant pain with a burning sensation.  Patient additionally reports some sharp suprapubic pain, feeling of straining with urination.  He endorses occasional blood in his urine.  Patient reports that he has had some blood in his stool as well for the last few days, as well as some dark stools.  He denies any history of same.  He has not had a colonoscopy before, denies history of colon cancer.  He denies any history of hemorrhoids. ? ? ?Shortness of Breath ?Associated symptoms: chest pain   ? ?  ? ?Home Medications ?Prior to Admission medications   ?Medication Sig Start Date End Date Taking? Authorizing Provider  ?aspirin 81 MG EC tablet Take 1 tablet (81 mg total) by mouth daily. 07/15/21 07/10/22  Darlin Drop, DO  ?atorvastatin (LIPITOR) 80 MG tablet Take 1 tablet (80 mg total) by mouth daily. 07/15/21 10/13/21  Darlin Drop, DO  ?carvedilol (COREG) 6.25 MG  tablet Take 1 tablet (6.25 mg total) by mouth EVERY 12 HOURS (10AM &10PM) 07/15/21 10/13/21  Darlin Drop, DO  ?clopidogrel (PLAVIX) 75 MG tablet Take 1 tablet (75 mg total) by mouth daily. 07/15/21 10/13/21  Darlin Drop, DO  ?furosemide (LASIX) 40 MG tablet Take 1 tablet (40 mg total) by mouth daily. 07/15/21 10/13/21  Darlin Drop, DO  ?hydrALAZINE (APRESOLINE) 50 MG tablet Take 1 tablet (50 mg total) by mouth 3 (three) times daily. 07/15/21 10/13/21  Darlin Drop, DO  ?potassium chloride (KLOR-CON M) 10 MEQ tablet Take 1 tablet (10 mEq total) by mouth daily. 07/15/21 10/13/21  Darlin Drop, DO  ?sacubitril-valsartan (ENTRESTO) 24-26 MG Take 1 tablet by mouth 2 (two) times daily. 07/15/21 10/13/21  Darlin Drop, DO  ?sitaGLIPtin (JANUVIA) 25 MG tablet Take 1 tablet (25 mg total) by mouth daily. 07/15/21 10/13/21  Darlin Drop, DO  ?spironolactone (ALDACTONE) 25 MG tablet Take 1 tablet (25 mg total) by mouth daily. 07/15/21 10/13/21  Darlin Drop, DO  ?   ? ?Allergies    ?Bee venom   ? ?Review of Systems   ?Review of Systems  ?Respiratory:  Positive for shortness of breath.   ?Cardiovascular:  Positive for chest pain.  ?Gastrointestinal:  Positive for blood in stool.  ?Genitourinary:  Positive for difficulty urinating and hematuria.  ?Musculoskeletal:  Positive for back pain.  ?All other systems reviewed and are negative. ? ?Physical Exam ?Updated Vital Signs ?  BP (!) 155/103   Pulse 88   Temp 98.5 ?F (36.9 ?C) (Oral)   Resp 17   Ht 5\' 9"  (1.753 m)   Wt 63.4 kg   SpO2 98%   BMI 20.64 kg/m?  ?Physical Exam ?Vitals and nursing note reviewed.  ?Constitutional:   ?   General: He is not in acute distress. ?   Appearance: Normal appearance.  ?HENT:  ?   Head: Normocephalic and atraumatic.  ?Eyes:  ?   General:     ?   Right eye: No discharge.     ?   Left eye: No discharge.  ?Cardiovascular:  ?   Rate and Rhythm: Normal rate and regular rhythm.  ?   Heart sounds: No murmur heard. ?  No friction rub. No gallop.  ?    Comments: No tenderness to palpation of the chest wall. ?Pulmonary:  ?   Effort: Pulmonary effort is normal.  ?   Breath sounds: Normal breath sounds.  ?   Comments: Patient with clear breath sounds bilaterally, slightly decreased respiratory effort.  He appears comfortable on 2 L nasal cannula which she was placed on for comfort.  He is saturating in the high 90s on room air.  No accessory breath sounds noted. ?Abdominal:  ?   General: Bowel sounds are normal.  ?   Palpations: Abdomen is soft.  ?   Comments: Patient endorses some tenderness palpation suprapubic region.  ?Genitourinary: ?   Comments: Patient with no evidence of external thrombosed hemorrhoids, no palpable rectal mass, Hemoccult negative stool which is light brown in appearance ?Musculoskeletal:  ?   Comments: Intact strength 5 out of 5 bilateral lower extremities.  Intact range of motion at the knee, ankle, and hip.  Patient endorses some pain in the paraspinous lumbar region, especially with walking.  Denies any saddle anesthesia.  ?Skin: ?   General: Skin is warm and dry.  ?   Capillary Refill: Capillary refill takes less than 2 seconds.  ?Neurological:  ?   Mental Status: He is alert and oriented to person, place, and time.  ?Psychiatric:     ?   Mood and Affect: Mood normal.     ?   Behavior: Behavior normal.  ? ? ?ED Results / Procedures / Treatments   ?Labs ?(all labs ordered are listed, but only abnormal results are displayed) ?Labs Reviewed  ?COMPREHENSIVE METABOLIC PANEL - Abnormal; Notable for the following components:  ?    Result Value  ? Glucose, Bld 150 (*)   ? Creatinine, Ser 1.42 (*)   ? Albumin 3.4 (*)   ? GFR, Estimated 57 (*)   ? All other components within normal limits  ?CBC WITH DIFFERENTIAL/PLATELET - Abnormal; Notable for the following components:  ? RDW 17.9 (*)   ? Eosinophils Absolute 0.7 (*)   ? All other components within normal limits  ?BRAIN NATRIURETIC PEPTIDE - Abnormal; Notable for the following components:  ? B  Natriuretic Peptide 327.0 (*)   ? All other components within normal limits  ?TROPONIN I (HIGH SENSITIVITY) - Abnormal; Notable for the following components:  ? Troponin I (High Sensitivity) 27 (*)   ? All other components within normal limits  ?RESP PANEL BY RT-PCR (FLU A&B, COVID) ARPGX2  ?VITAMIN B12  ?URINALYSIS, ROUTINE W REFLEX MICROSCOPIC  ?POC OCCULT BLOOD, ED  ?TROPONIN I (HIGH SENSITIVITY)  ? ? ?EKG ?None ? ?Radiology ?DG Chest 2 View ? ?Result Date: 08/18/2021 ?CLINICAL DATA:  Dyspnea and  shortness of breath. EXAM: CHEST - 2 VIEW COMPARISON:  07/12/2021 FINDINGS: The heart size and mediastinal contours are within normal limits. Both lungs are clear. The visualized skeletal structures are unremarkable. IMPRESSION: No active cardiopulmonary disease. Electronically Signed   By: Kennith CenterEric  Mansell M.D.   On: 08/18/2021 06:40   ? ?Procedures ?Procedures  ? ? ?Medications Ordered in ED ?Medications  ?morphine (PF) 4 MG/ML injection 4 mg (has no administration in time range)  ? ? ?ED Course/ Medical Decision Making/ A&P ?  ?                        ?Medical Decision Making ?Amount and/or Complexity of Data Reviewed ?Labs: ordered. ? ?Risk ?Prescription drug management. ? ? ?I discussed this case with my attending physician who cosigned this note including patient's presenting symptoms, physical exam, and planned diagnostics and interventions. Attending physician stated agreement with plan or made changes to plan which were implemented.  ? ?This patient presents to the ED for concern of shortness of breath, intermittent chest pain over the last few days, suprapubic abdominal pain, ongoing lower back pain after fall around a month ago, dysuria, hematuria, as well as hematochezia, this involves an extensive number of treatment options, and is a complaint that carries with it a high risk of complications and morbidity. The emergent differential diagnosis prior to evaluation includes, but is not limited to, ACS, acute  aortic syndrome, Boerhaave's, Mallory-Weiss, pneumonia, heart failure exacerbation, upper GI bleed, lower GI bleed, UTI, pyelonephritis, nephrolithiasis, thoracic or lumbar strain, thoracic or lumbar fracture, sp

## 2021-08-18 NOTE — ED Notes (Addendum)
EDPA at [facility] for rectal exam hemmoccult sample. RN chaperone present. No obvious blood. H&H reviewed. Sample to minilab. Requesting food. States, "haven't eaten since yesterday".  ?

## 2021-08-24 ENCOUNTER — Telehealth (HOSPITAL_COMMUNITY): Payer: Self-pay

## 2021-08-24 NOTE — Telephone Encounter (Signed)
Called to confirm/remind patient of their appointment at [facility] on 08/25/21.  ? ?Patient reminded to bring all medications and/or complete list. ? ?Confirmed patient has transportation. Gave directions, instructed to utilize valet parking. ? ?Confirmed appointment prior to ending call.  ? ?

## 2021-08-25 ENCOUNTER — Encounter (HOSPITAL_COMMUNITY): Payer: Self-pay

## 2021-08-25 ENCOUNTER — Ambulatory Visit (HOSPITAL_COMMUNITY)
Admission: RE | Admit: 2021-08-25 | Discharge: 2021-08-25 | Disposition: A | Discharge status: Home / Self Care | Payer: Medicare Other | Source: Ambulatory Visit | Attending: Family Medicine | Admitting: Family Medicine

## 2021-08-25 VITALS — BP 160/104 | HR 96 | Wt 149.8 lb

## 2021-08-25 DIAGNOSIS — I10 Essential (primary) hypertension: Secondary | ICD-10-CM | POA: Diagnosis not present

## 2021-08-25 DIAGNOSIS — I2510 Atherosclerotic heart disease of native coronary artery without angina pectoris: Secondary | ICD-10-CM | POA: Insufficient documentation

## 2021-08-25 DIAGNOSIS — N189 Chronic kidney disease, unspecified: Secondary | ICD-10-CM | POA: Insufficient documentation

## 2021-08-25 DIAGNOSIS — Z79899 Other long term (current) drug therapy: Secondary | ICD-10-CM | POA: Insufficient documentation

## 2021-08-25 DIAGNOSIS — Z139 Encounter for screening, unspecified: Secondary | ICD-10-CM

## 2021-08-25 DIAGNOSIS — E11610 Type 2 diabetes mellitus with diabetic neuropathic arthropathy: Secondary | ICD-10-CM

## 2021-08-25 DIAGNOSIS — Z7982 Long term (current) use of aspirin: Secondary | ICD-10-CM | POA: Diagnosis not present

## 2021-08-25 DIAGNOSIS — I130 Hypertensive heart and chronic kidney disease with heart failure and stage 1 through stage 4 chronic kidney disease, or unspecified chronic kidney disease: Secondary | ICD-10-CM | POA: Diagnosis not present

## 2021-08-25 DIAGNOSIS — I493 Ventricular premature depolarization: Secondary | ICD-10-CM | POA: Insufficient documentation

## 2021-08-25 DIAGNOSIS — E1122 Type 2 diabetes mellitus with diabetic chronic kidney disease: Secondary | ICD-10-CM | POA: Diagnosis not present

## 2021-08-25 DIAGNOSIS — E1140 Type 2 diabetes mellitus with diabetic neuropathy, unspecified: Secondary | ICD-10-CM | POA: Insufficient documentation

## 2021-08-25 DIAGNOSIS — Z7984 Long term (current) use of oral hypoglycemic drugs: Secondary | ICD-10-CM | POA: Diagnosis not present

## 2021-08-25 DIAGNOSIS — I5022 Chronic systolic (congestive) heart failure: Secondary | ICD-10-CM | POA: Insufficient documentation

## 2021-08-25 DIAGNOSIS — Z8673 Personal history of transient ischemic attack (TIA), and cerebral infarction without residual deficits: Secondary | ICD-10-CM | POA: Diagnosis not present

## 2021-08-25 DIAGNOSIS — I43 Cardiomyopathy in diseases classified elsewhere: Secondary | ICD-10-CM | POA: Diagnosis not present

## 2021-08-25 DIAGNOSIS — Z7902 Long term (current) use of antithrombotics/antiplatelets: Secondary | ICD-10-CM | POA: Diagnosis not present

## 2021-08-25 DIAGNOSIS — I252 Old myocardial infarction: Secondary | ICD-10-CM | POA: Diagnosis not present

## 2021-08-25 DIAGNOSIS — E1165 Type 2 diabetes mellitus with hyperglycemia: Secondary | ICD-10-CM | POA: Diagnosis not present

## 2021-08-25 MED ORDER — ENTRESTO 49-51 MG PO TABS: 1.0000 | ORAL_TABLET | Freq: Two times a day (BID) | ORAL | 11 refills | Status: DC

## 2021-08-25 NOTE — Progress Notes (Signed)
? ?ADVANCED HF CLINIC CONSULT NOTE ? ? ?Primary Care: [name], [name], MD ?HF Cardiologist: Dr. Gala Romney ? ?HPI: ?[name] is a 61 y.o. male with history of CAD with prior MI and stent in 01/2019 at [facility] in [place] (report not available), cardiomyopathy/chronic systolic HF, uncontrolled DM II, HTN, hx CVA on chart review, CKD.  ?  ?Moved to [place] from [place], [place] 2 years ago.  ? ?Had not medical f/u until he was admitted in 10/22 with a/c systolic HF. Had been out of cardiac medications. Diuresed with IV lasix then transitioned to po lasix 40 mg daily. Started on GDMT with carvedilol, hydralazine, imdur and empagliflozin.  ?  ?Echo 10/22 with EF 20-25%, RV okay, trivial MR ?  ?R/LHC 10/22 with nonobstructive CAD and patent RPLV stent. RA mean 5 mmHg, PCWP mean 5 mmHg, Fick CO 7.46/CI 3.85. ?  ?He did not show up for [facility] appointment after discharge.  ? ?cMRI ( 11/22): LVEF 24% RVEF 21% LGE and ECW suggestive of cardiac amyloidosis ? 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 ? ?Admitted 12/22 with a/c CHF due to noncompliance with GDMT. cMRI strongly suggestive of amyloidosis, GDMT titrated. Admitted 2/23 with a/c CHF after running out of meds x 1 week. Given IV lasix, GDMT restarted. Imdur held with low BP. Seen in ED 3/23 with back pain, felt to be related to MSK. Seen in ED 08/18/21 with SOB, cardiac work up unrevealing. ? ?Today he returns for HF follow up. Main complaint is neuropathy in feet, he is taking gabapentin. He is able to walk around on flat ground and do ADLs without SOB. Denies palpitations, abnormal bleeding, CP, dizziness, edema, or PND/Orthopnea. Appetite ok. No fever or chills. He does not weigh at home. Taking all medications, his son helps with meds. 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 Has been on disability the last few years due to medical conditions. Lives with his 27 year old son. No ETOH/tobacco/drugs. He says  transportation issues have prevented close follow up. ? 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  ? ?- cMRI (11/22): LVEF 24% RVEF 21% LGE and ECW suggestive of cardiac amyloidosis ? ?Review of Systems: [y] = yes, [ ]  = no  ? ?General: Weight gain [ ] ; Weight loss [ ] ; Anorexia [ ] ; Fatigue [ ] ; Fever [ ] ; Chills [ ] ; Weakness [ ]   ?Cardiac: Chest pain/pressure [ ] ; Resting SOB [ ] ; Exertional SOB Cove.Etienne ]; Orthopnea [ ] ; Pedal Edema [ ] ; Palpitations [ ] ; Syncope [ ] ; Presyncope [ ] ; Paroxysmal nocturnal dyspnea[ ]   ?Pulmonary: Cough [ ] ; Wheezing[ ] ; Hemoptysis[ ] ; Sputum [ ] ; Snoring [ ]   ?GI: Vomiting[ ] ; Dysphagia[ ] ; Melena[ ] ; Hematochezia [ ] ; Heartburn[ ] ; Abdominal pain [ ] ; Constipation [ ] ; Diarrhea [ ] ; BRBPR [ ]   ?GU: Hematuria[ ] ; Dysuria [ ] ; Nocturia[ ]   ?Vascular: Pain in legs with walking Cove.Etienne ]; Pain in feet with lying flat [ ] ; Non-healing sores [ ] ; Stroke [ ] ; TIA [ ] ; Slurred speech [ ] ;  ?Neuro: Headaches[ ] ; Vertigo[ ] ; Seizures[ ] ; Paresthesias[ ] ;Blurred vision [ ] ; Diplopia [ ] ; Vision changes [ ]   ?Ortho/Skin: Arthritis [ ] ; Joint pain [ ] ; Muscle pain [ ] ; Joint swelling [ ] ; Back Pain [ ] ; Rash [ ]   ?Psych: Depression[ ] ; Anxiety[ ]   ?Heme: Bleeding problems [ ] ; Clotting  disorders [ ] ; Anemia [ ]   ?Endocrine: Diabetes Blue.Reese ]; Thyroid dysfunction[ ]  ? ?Past Medical History:  ?Diagnosis Date  ? CHF (congestive heart failure) ([place])   ? Coronary artery disease   ? Diabetes mellitus without complication ([place])   ? History of MI (myocardial infarction) 03/05/2019  ? Hypertension   ? Stroke [facility])   ? ?Current Outpatient Medications  ?Medication Sig Dispense Refill  ? aspirin 81 MG EC tablet Take 1 tablet (81 mg total) by mouth daily. 360 tablet 0  ? atorvastatin (LIPITOR) 80 MG tablet Take 1 tablet (80 mg total) by mouth daily. 90 tablet 0  ? carvedilol (COREG) 6.25 MG tablet Take 1 tablet (6.25 mg total) by mouth EVERY 12  HOURS (10AM &10PM) 180 tablet 0  ? clopidogrel (PLAVIX) 75 MG tablet Take 1 tablet (75 mg total) by mouth daily. 90 tablet 0  ? furosemide (LASIX) 40 MG tablet Take 1 tablet (40 mg total) by mouth daily. 90 tablet 0  ? hydrALAZINE (APRESOLINE) 50 MG tablet Take 1 tablet (50 mg total) by mouth 3 (three) times daily. 270 tablet 0  ? methocarbamol (ROBAXIN) 500 MG tablet Take 1 tablet (500 mg total) by mouth 2 (two) times daily. 20 tablet 0  ? potassium chloride (KLOR-CON M) 10 MEQ tablet Take 1 tablet (10 mEq total) by mouth daily. 90 tablet 0  ? predniSONE (DELTASONE) 20 MG tablet Take 2 tablets (40 mg total) by mouth daily. 10 tablet 0  ? sacubitril-valsartan (ENTRESTO) 24-26 MG Take 1 tablet by mouth 2 (two) times daily. 180 tablet 0  ? sitaGLIPtin (JANUVIA) 25 MG tablet Take 1 tablet (25 mg total) by mouth daily. 90 tablet 0  ? spironolactone (ALDACTONE) 25 MG tablet Take 1 tablet (25 mg total) by mouth daily. 90 tablet 0  ? gabapentin (NEURONTIN) 400 MG capsule Take 1 capsule (400 mg total) by mouth 2 (two) times daily. (Patient not taking: Reported on 08/25/2021) 60 capsule 0  ? ?No current facility-administered medications for this encounter.  ? ?Allergies  ?Allergen Reactions  ? Bee Venom   ?  swelling  ? ?Social History  ? ?Socioeconomic History  ? Marital status: Single  ?  Spouse name: Not on file  ? Number of children: Not on file  ? Years of education: Not on file  ? Highest education level: Not on file  ?Occupational History  ? Not on file  ?Tobacco Use  ? Smoking status: Former  ?  Types: Cigarettes  ?  Quit date: 10/06/2017  ?  Years since quitting: 3.8  ? Smokeless tobacco: Never  ?Substance and Sexual Activity  ? Alcohol use: Not Currently  ? Drug use: Yes  ?  Types: Marijuana  ? Sexual activity: Not on file  ?Other Topics Concern  ? Not on file  ?Social History Narrative  ? Not on file  ? ?Social Determinants of Health  ? ?Financial Resource Strain: Low Risk   ? Difficulty of Paying Living Expenses:  Not very hard  ?Food Insecurity: No Food Insecurity  ? Worried About Charity fundraiser in the Last Year: Never true  ? Ran Out of Food in the Last Year: Never true  ?Transportation Needs: No Transportation Needs  ? Lack of Transportation (Medical): No  ? Lack of Transportation (Non-Medical): No  ?Physical Activity: Not on file  ?Stress: Not on file  ?Social Connections: Not on file  ?Intimate Partner Violence: Not on file  ? ? No family history on  file. ? ?BP (!) 160/104   Pulse 96   Wt 67.9 kg   SpO2 97%   BMI 22.12 kg/m?  ? ?Wt Readings from Last 3 Encounters:  ?08/25/21 67.9 kg  ?08/18/21 63.4 kg  ?08/03/21 63.4 kg  ? ?PHYSICAL EXAM: ?General:  NAD. No resp difficulty, arrived in [facility], chronically-ill appearing. ?HEENT: Normal ?Neck: Supple. JVP 6-7. Carotids 2+ bilat; no bruits. No lymphadenopathy or thryomegaly appreciated. ?Cor: PMI nondisplaced. Regular rate & rhythm. No rubs, gallops or murmurs. ?Lungs: Clear, diminished in bases. ?Abdomen: Soft, nontender, nondistended. No hepatosplenomegaly. No bruits or masses. Good bowel sounds. ?Extremities: No cyanosis, clubbing, rash, edema ?Neuro: Alert & oriented x 3, cranial nerves grossly intact. Moves all 4 extremities w/o difficulty. Affect pleasant. ? ?ECG: NSR 93 bpm, no ectopy (personally reviewed) ? ?ReDs: 35% ? ?ASSESSMENT & PLAN: ?Chronic systolic HF/cardiac amyloidosis ?- Onset not certain. He reports cardiomyopathy diagnosed just prior to MI while living in [place] a couple of years ago. ?- Echo (10/22): EF 20-25%, RV ok ?- R/LHC (10/22): Patent RPLV stent with nonobstructive disease elsewhere, Preserved CO/CI, RA mean 5 mmHg, PCWP mean 5 mmHg ?- Etiology not certain. ? If due to frequent PVCs and/or uncontrolled hypertension. Denies hx drug abuse. No recent ETOH abuse. ?- cMRI (11/22): LVEF 24% RVEF 21% LGE and ECW suggestive of cardiac amyloidosis. ?- PYP (12/22) strongly suggestive of transthyretin amyloidosis. Will need genetic testing. ?- Multiple  myeloma panel (12/22) with no m-spike. ?- NYHA II today, physically limited by neuropathy in feet. Volume looks ok on exam, ReDs on the high end at 35%. ?- Increase Entresto to 49/51 mg bid. ?- No SGLT2i w/ A1c 12. ?-

## 2021-08-25 NOTE — Patient Instructions (Addendum)
EKG done today. ? ?Labs done today. We will contact you only if your labs are abnormal. ? ?INCREASE Entresto to 49-51mg  (1 tablet) by mouth 2 times daily. ? ?No other medication changes were made. Please continue all current medications as prescribed. ? ?Genetic test has been done, this has to be sent to [place] to be processed and can take 1-2 weeks to get results back.  We will let you know the results. ? ?You have been referred to Paramedicine. Our social worker, Eileen Stanford will be in contact with you.  ? ?Please follow up with your Primary Care Physician ASAP. ?  ?Your physician recommends that you schedule a follow-up appointment in: 1 week with Dr. Gala Romney ? ?If you have any questions or concerns before your next appointment please send [place] a message through [place] or call our office at [phone].   ? ?TO LEAVE A MESSAGE FOR THE NURSE SELECT OPTION 2, PLEASE LEAVE A MESSAGE INCLUDING: ?YOUR NAME ?DATE OF BIRTH ?CALL BACK NUMBER ?REASON FOR CALL**this is important as we prioritize the call backs ? ?YOU WILL RECEIVE A CALL BACK THE SAME DAY AS LONG AS YOU CALL BEFORE 4:00 PM ? ? ?Do the following things EVERYDAY: ?Weigh yourself in the morning before breakfast. Write it down and keep it in a log. ?Take your medicines as prescribed ?Eat low salt foods--Limit salt (sodium) to 2000 mg per day.  ?Stay as active as you can everyday ?Limit all fluids for the day to less than 2 liters ? ? ?At the [facility], you and your health needs are our priority. As part of our continuing mission to provide you with exceptional heart care, we have created designated Provider Care Teams. These Care Teams include your primary Cardiologist (physician) and Advanced Practice Providers (APPs- Physician Assistants and Nurse Practitioners) who all work together to provide you with the care you need, when you need it.  ? ?You may see any of the following providers on your designated Care Team at your next follow  up: ?Dr Arvilla Meres ?Dr Marca Ancona ?Tonye Becket, NP ?Robbie Lis, PA ?Karle Plumber, PharmD ? ? ?Please be sure to bring in all your medications bottles to every appointment.  ? ?

## 2021-08-25 NOTE — Progress Notes (Signed)
ReDS Vest / Clip - 08/25/21 1500   ? ?  ? ReDS Vest / Clip  ? Station Marker C   ? Ruler Value 29   ? ReDS Value Range Low volume   ? ReDS Actual Value 35   ? ?  ?  ? ?  ? ? ?

## 2021-08-25 NOTE — Progress Notes (Signed)
CSW arranged transport with [employer] for ride home. Lasandra Beech, LCSW, CCSW-MCS [phone] ? ?

## 2021-09-02 ENCOUNTER — Ambulatory Visit (HOSPITAL_COMMUNITY)
Admission: RE | Admit: 2021-09-02 | Discharge: 2021-09-02 | Disposition: A | Discharge status: Home / Self Care | Payer: Medicare Other | Source: Ambulatory Visit | Attending: Internal Medicine | Admitting: Internal Medicine

## 2021-09-02 ENCOUNTER — Encounter (HOSPITAL_COMMUNITY): Payer: Self-pay | Admitting: Internal Medicine

## 2021-09-02 ENCOUNTER — Other Ambulatory Visit (HOSPITAL_COMMUNITY): Payer: Self-pay

## 2021-09-02 ENCOUNTER — Telehealth (HOSPITAL_COMMUNITY): Payer: Self-pay | Admitting: Pharmacy Technician

## 2021-09-02 VITALS — BP 160/78 | HR 89 | Wt 149.0 lb

## 2021-09-02 DIAGNOSIS — E1165 Type 2 diabetes mellitus with hyperglycemia: Secondary | ICD-10-CM | POA: Diagnosis not present

## 2021-09-02 DIAGNOSIS — I5022 Chronic systolic (congestive) heart failure: Secondary | ICD-10-CM | POA: Insufficient documentation

## 2021-09-02 DIAGNOSIS — Z7982 Long term (current) use of aspirin: Secondary | ICD-10-CM | POA: Insufficient documentation

## 2021-09-02 DIAGNOSIS — I5020 Unspecified systolic (congestive) heart failure: Secondary | ICD-10-CM | POA: Diagnosis not present

## 2021-09-02 DIAGNOSIS — E854 Organ-limited amyloidosis: Secondary | ICD-10-CM

## 2021-09-02 DIAGNOSIS — E1140 Type 2 diabetes mellitus with diabetic neuropathy, unspecified: Secondary | ICD-10-CM | POA: Diagnosis not present

## 2021-09-02 DIAGNOSIS — Z7901 Long term (current) use of anticoagulants: Secondary | ICD-10-CM | POA: Diagnosis not present

## 2021-09-02 DIAGNOSIS — I252 Old myocardial infarction: Secondary | ICD-10-CM | POA: Insufficient documentation

## 2021-09-02 DIAGNOSIS — N189 Chronic kidney disease, unspecified: Secondary | ICD-10-CM | POA: Diagnosis not present

## 2021-09-02 DIAGNOSIS — Z09 Encounter for follow-up examination after completed treatment for conditions other than malignant neoplasm: Secondary | ICD-10-CM | POA: Insufficient documentation

## 2021-09-02 DIAGNOSIS — I493 Ventricular premature depolarization: Secondary | ICD-10-CM | POA: Diagnosis not present

## 2021-09-02 DIAGNOSIS — I43 Cardiomyopathy in diseases classified elsewhere: Secondary | ICD-10-CM | POA: Insufficient documentation

## 2021-09-02 DIAGNOSIS — Z79899 Other long term (current) drug therapy: Secondary | ICD-10-CM | POA: Diagnosis not present

## 2021-09-02 DIAGNOSIS — F17210 Nicotine dependence, cigarettes, uncomplicated: Secondary | ICD-10-CM | POA: Diagnosis not present

## 2021-09-02 DIAGNOSIS — Z7902 Long term (current) use of antithrombotics/antiplatelets: Secondary | ICD-10-CM | POA: Insufficient documentation

## 2021-09-02 DIAGNOSIS — I130 Hypertensive heart and chronic kidney disease with heart failure and stage 1 through stage 4 chronic kidney disease, or unspecified chronic kidney disease: Secondary | ICD-10-CM | POA: Insufficient documentation

## 2021-09-02 DIAGNOSIS — I2510 Atherosclerotic heart disease of native coronary artery without angina pectoris: Secondary | ICD-10-CM | POA: Diagnosis not present

## 2021-09-02 DIAGNOSIS — Z8673 Personal history of transient ischemic attack (TIA), and cerebral infarction without residual deficits: Secondary | ICD-10-CM | POA: Diagnosis not present

## 2021-09-02 DIAGNOSIS — E1122 Type 2 diabetes mellitus with diabetic chronic kidney disease: Secondary | ICD-10-CM | POA: Insufficient documentation

## 2021-09-02 DIAGNOSIS — E11610 Type 2 diabetes mellitus with diabetic neuropathic arthropathy: Secondary | ICD-10-CM

## 2021-09-02 DIAGNOSIS — Z7984 Long term (current) use of oral hypoglycemic drugs: Secondary | ICD-10-CM | POA: Diagnosis not present

## 2021-09-02 LAB — BASIC METABOLIC PANEL
Anion gap: 7 (ref 5–15)
BUN: 14 mg/dL (ref 6–20)
CO2: 28 mmol/L (ref 22–32)
Calcium: 9.2 mg/dL (ref 8.9–10.3)
Chloride: 107 mmol/L (ref 98–111)
Creatinine, Ser: 1.14 mg/dL (ref 0.61–1.24)
GFR, Estimated: >60 mL/min (ref >60)
Glucose, Bld: 145 mg/dL — ABNORMAL HIGH (ref 70–99)
Potassium: 4.2 mmol/L (ref 3.5–5.1)
Sodium: 142 mmol/L (ref 135–145)

## 2021-09-02 LAB — BRAIN NATRIURETIC PEPTIDE: B Natriuretic Peptide: 627.5 pg/mL — ABNORMAL HIGH (ref 0.0–100.0)

## 2021-09-02 MED ORDER — ENTRESTO 97-103 MG PO TABS: 1.0000 | ORAL_TABLET | Freq: Two times a day (BID) | ORAL | 11 refills | Status: DC

## 2021-09-02 MED ORDER — ISOSORBIDE MONONITRATE ER 30 MG PO TB24: 30.0000 mg | ORAL_TABLET | Freq: Every day | ORAL | 3 refills | Status: DC

## 2021-09-02 MED ORDER — TAFAMIDIS 61 MG PO CAPS
61.0000 mg | ORAL_CAPSULE | Freq: Every day | ORAL | 11 refills | Status: DC
  Filled 2021-09-02: qty 30, fill #0
  Filled 2021-09-10: qty 30, 30d supply, fill #0
  Filled 2021-10-08: qty 30, 30d supply, fill #1
  Filled 2021-11-03: qty 30, 30d supply, fill #2
  Filled 2021-12-10: qty 30, 30d supply, fill #3
  Filled 2022-01-26: qty 30, 30d supply, fill #4
  Filled 2022-02-22: qty 30, 30d supply, fill #5
  Filled 2022-03-18: qty 30, 30d supply, fill #6
  Filled 2022-04-16: qty 30, 30d supply, fill #7
  Filled 2022-05-26 – 2022-06-17 (×2): qty 30, 30d supply, fill #8

## 2021-09-02 NOTE — Progress Notes (Addendum)
? ?ADVANCED HF CLINIC  NOTE ? ? ?Primary Care: [name], [name], MD ?HF Cardiologist: Dr. Haroldine Laws ? ?HPI: ?Mr. Mutchler is a 61 y.o. male with history of CAD with prior MI and stent in 01/2019 at [facility] in [place] (report not available), cardiomyopathy/chronic systolic HF, uncontrolled DM II, HTN, hx CVA on chart review, CKD.  ?  ?Moved to [place] from [place] 2 years ago.  ? ?Had not medical f/u until he was admitted in 67/61 with a/c systolic HF. Had been out of cardiac medications. Diuresed with IV lasix then transitioned to po lasix 40 mg daily. Started on GDMT with carvedilol, hydralazine, imdur and empagliflozin.  ?  ?Echo 10/22 with EF 20-25%, RV okay, trivial MR ?  ?R/LHC 10/22 with nonobstructive CAD and patent RPLV stent. RA mean 5 mmHg, PCWP mean 5 mmHg, Fick CO 7.46/CI 3.85. ?  ?He did not show up for [facility] appointment after discharge.  ? ?cMRI ( 11/22): LVEF 24% RVEF 21% LGE and ECW suggestive of cardiac amyloidosis ? 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 ? ?Admitted 12/22 with a/c CHF due to noncompliance with GDMT. cMRI strongly suggestive of amyloidosis, GDMT titrated. Admitted 2/23 with a/c CHF after running out of meds x 1 week. Given IV lasix, GDMT restarted. Imdur held with low BP. Seen in ED 3/23 with back pain, felt to be related to MSK. Seen in ED 08/18/21 with SOB, cardiac work up unrevealing. ? ?Today he returns for HF follow up. Main complaint is neuropathy. Denies SOB, orthopnea or PND. 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 Has been on disability the last few years due to medical conditions. Lives with his 3 year old son. No ETOH/drugs. Smoking occasionally. He says transportation issues have prevented close follow up. ? 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  ? ?- cMRI (11/22): LVEF 24% RVEF 21% LGE and ECW suggestive of cardiac  amyloidosis ? ? ? ?Past Medical History:  ?Diagnosis Date  ? CHF (congestive heart failure) ([place])   ? Coronary artery disease   ? Diabetes mellitus without complication ([place])   ? History of MI (myocardial infarction) 03/05/2019  ? Hypertension   ? Stroke [facility])   ? ?Current Outpatient Medications  ?Medication Sig Dispense Refill  ? aspirin 81 MG EC tablet Take 1 tablet (81 mg total) by mouth daily. 360 tablet 0  ? atorvastatin (LIPITOR) 80 MG tablet Take 1 tablet (80 mg total) by mouth daily. 90 tablet 0  ? carvedilol (COREG) 6.25 MG tablet Take 1 tablet (6.25 mg total) by mouth EVERY 12 HOURS (10AM &10PM) 180 tablet 0  ? clopidogrel (PLAVIX) 75 MG tablet Take 1 tablet (75 mg total) by mouth daily. 90 tablet 0  ? furosemide (LASIX) 40 MG tablet Take 1 tablet (40 mg total) by mouth daily. 90 tablet 0  ? gabapentin (NEURONTIN) 400 MG capsule Take 1 capsule (400 mg total) by mouth 2 (two) times daily. 60 capsule 0  ? hydrALAZINE (APRESOLINE) 50 MG tablet Take 1 tablet (50 mg total) by mouth 3 (three) times daily. 270 tablet 0  ? methocarbamol (ROBAXIN) 500 MG tablet Take 1 tablet (500 mg total) by mouth 2 (two) times daily. 20 tablet 0  ? potassium chloride (KLOR-CON M) 10 MEQ tablet Take 1 tablet (10 mEq total) by mouth daily. 90 tablet 0  ? predniSONE (DELTASONE) 20 MG tablet  Take 2 tablets (40 mg total) by mouth daily. 10 tablet 0  ? sacubitril-valsartan (ENTRESTO) 49-51 MG Take 1 tablet by mouth 2 (two) times daily. 60 tablet 11  ? sitaGLIPtin (JANUVIA) 25 MG tablet Take 1 tablet (25 mg total) by mouth daily. 90 tablet 0  ? spironolactone (ALDACTONE) 25 MG tablet Take 1 tablet (25 mg total) by mouth daily. 90 tablet 0  ? ?No current facility-administered medications for this encounter.  ? ?Allergies  ?Allergen Reactions  ? Bee Venom   ?  swelling  ? ?Social History  ? ?Socioeconomic History  ? Marital status: Single  ?  Spouse name: Not on file  ? Number of children: Not on file  ? Years of education: Not on file  ?  Highest education level: Not on file  ?Occupational History  ? Not on file  ?Tobacco Use  ? Smoking status: Former  ?  Types: Cigarettes  ?  Quit date: 10/06/2017  ?  Years since quitting: 3.9  ? Smokeless tobacco: Never  ?Substance and Sexual Activity  ? Alcohol use: Not Currently  ? Drug use: Yes  ?  Types: Marijuana  ? Sexual activity: Not on file  ?Other Topics Concern  ? Not on file  ?Social History Narrative  ? Not on file  ? ?Social Determinants of Health  ? ?Financial Resource Strain: Low Risk   ? Difficulty of Paying Living Expenses: Not very hard  ?Food Insecurity: No Food Insecurity  ? Worried About Charity fundraiser in the Last Year: Never true  ? Ran Out of Food in the Last Year: Never true  ?Transportation Needs: Unmet Transportation Needs  ? Lack of Transportation (Medical): Yes  ? Lack of Transportation (Non-Medical): Yes  ?Physical Activity: Not on file  ?Stress: Not on file  ?Social Connections: Not on file  ?Intimate Partner Violence: Not on file  ? ? No family history on file. ? ?BP (!) 160/78   Pulse 89   Wt 67.6 kg (149 lb)   SpO2 99%   BMI 22.00 kg/m?  ? ?Wt Readings from Last 3 Encounters:  ?09/02/21 67.6 kg (149 lb)  ?08/25/21 67.9 kg (149 lb 12.8 oz)  ?08/18/21 63.4 kg (139 lb 12.4 oz)  ? ?PHYSICAL EXAM: ?General:  NAD. No resp difficulty, arrived in [facility], chronically-ill appearing. ?HEENT: normal ?Neck: supple. no JVD. Carotids 2+ bilat; no bruits. No lymphadenopathy or thryomegaly appreciated. ?Cor: PMI nondisplaced. Regular rate & rhythm. No rubs, gallops or murmurs. ?Lungs: clear ?Abdomen: soft, nontender, nondistended. No hepatosplenomegaly. No bruits or masses. Good bowel sounds. ?Extremities: no cyanosis, clubbing, rash, edema ?Neuro: alert & orientedx3, cranial nerves grossly intact. moves all 4 extremities w/o difficulty. Affect pleasant ? ?ASSESSMENT & PLAN: ?Chronic systolic HF/cardiac amyloidosis ?- Onset not certain. He reports cardiomyopathy diagnosed just prior to MI while  living in [place] a couple of years ago. ?- Echo (10/22): EF 20-25%, RV ok ?- R/LHC (10/22): Patent RPLV stent with nonobstructive disease elsewhere, Preserved CO/CI, RA mean 5 mmHg, PCWP mean 5 mmHg ?- cMRI (11/22): LVEF 24% RVEF 21% LGE and ECW suggestive of cardiac amyloidosis. ?- PYP (12/22) read as markedly positive. I thought equivocal but based on cMRI likely positive Will need genetic testing. ?- Multiple myeloma panel (12/22) with no m-spike. ?- NYHA II today, physically limited by neuropathy in feet. Volume looks ok on exam ?- Increase Entresto to 97/103 mg bid. ?- No SGLT2i w/ A1c 12. ?- Continue carvedilol 6.25 mg bid.  ?-  Continue Lasix 40 mg daily for now. ?- Continue hydralazine 50 mg tid. Add Imdur 30 ?- Continue spironolactone 25 mg daily.  ?- Await genetic testing   ?- Will start tafamadis and will likely benefit from [place] as well ?- Refer to Dr. Posey Pronto in Neurology. Unclear if neuropathy due to DM2 or TTR ?  ?2. CAD ?- Prior MI followed by PCI in [place] in either 2019 or 2020.  ?- Patent RPLV stent on [facility] 10/22. Also had 20% distal RCA, 30% p LAD, 70% d LAD, 60% OM2 ?- No s/s angina ?- Continue ASA + Plavix. ?- Continue atorvastatin 80 mg daily. ?  ?3. HTN ?- Elevated. Increase Entresto to 97/103 bid ?- Add Imdur  ? ?4. Uncontrolled DM ?- Needs PCP follow up. ?- On Januvia. ?- No SGLT2i yet with A1c 12. ? ?5. PVCs ?- Denies palpitations. ?- ECG ok today. ? ?6. Neuropathy ?- This seems to be his biggest limiter. ?- On gabapentin. ?- Refer to Dr. Posey Pronto in Neurology. Consider Amvuttra ? ?7. SDOH ?- He has Medicaid and disability. ?- Transportation is an issue, he has a car but unable to drive (needs license). ?- 63 year old son helps with meds. ?- Engage HFSW ? ?Total time spent 45 minutes. Over half that time spent discussing above.  ? ? ?Glori Bickers, MD  ?9:54 PM ? ? ?

## 2021-09-02 NOTE — Telephone Encounter (Signed)
Patient Advocate Encounter ?  ?Received notification from [employer] Medicare Part D that prior authorization for Vyndamax is required. ?  ?PA submitted on CoverMyMeds ?Key [id number] ?Status is pending ?  ?Will continue to follow. ? ?

## 2021-09-02 NOTE — Telephone Encounter (Signed)
Advanced Heart Failure Patient Advocate Encounter ? ?Prior Authorization for [name] has been approved.   ? ?PA# [id number] ?Effective dates: 09/02/21 through 05/23/22 ? ?Patients co-pay is $2,499.51 (co-pay should drop now that the patient is going into catastrophic coverage on next fill.) ? ?Advanced Heart Failure Patient Advocate Encounter ? ?The patient was approved for a [employer] grant that will help cover the cost of Vyndamax. Total amount awarded, $10,000. Eligibility, 08/03/21 - 08/03/22. ? ?ID [id number] ? ?BIN [id number] ? ?PCN [id number] ? ?Group [id number] ? ?Patient was also placed on PAN Amyloid waitlist. Called patient to set up specialty pharmacy services, no way to leave message. Will try and call again.  ?

## 2021-09-02 NOTE — Patient Instructions (Signed)
Medication Changes: ? ?Start Imdur 30mg  daily ? ?Start Tafamadis 61 mg daily ? ?Increase Entresto to 97/103 Twice daily ? ? ?Lab Work: ? ?Labs done today, your results will be available in MyChart, we will contact you for abnormal readings. ? ? ?Testing/Procedures: ? ?Your physician has requested that you have an echocardiogram. Echocardiography is a painless test that uses sound waves to create images of your heart. It provides your doctor with information about the size and shape of your heart and how well your heart?s chambers and valves are working. This procedure takes approximately one hour. There are no restrictions for this procedure. ? ? ?Referrals: ? ?none ? ?Special Instructions // Education: ? ?none ? ?Follow-Up in: 2 months ? ?At the [facility], you and your health needs are our priority. We have a designated team specialized in the treatment of Heart Failure. This Care Team includes your primary Heart Failure Specialized Cardiologist (physician), Advanced Practice Providers (APPs- Physician Assistants and Nurse Practitioners), and Pharmacist who all work together to provide you with the care you need, when you need it.  ? ?You may see any of the following providers on your designated Care Team at your next follow up: ? ?Dr Glori Bickers ?Dr Loralie Champagne ?Darrick Grinder, NP ?Lyda Jester, PA ?Jessica Milford,NP ?Marlyce Huge, PA ?Audry Riles, PharmD ? ? ?Please be sure to bring in all your medications bottles to every appointment.  ? ?Need to Contact us: ? ?If you have any questions or concerns before your next appointment please send [place] a message through [place] or call our office at [phone].   ? ?TO LEAVE A MESSAGE FOR THE NURSE SELECT OPTION 2, PLEASE LEAVE A MESSAGE INCLUDING: ?YOUR NAME ?DATE OF BIRTH ?CALL BACK NUMBER ?REASON FOR CALL**this is important as we prioritize the call backs ? ?YOU WILL RECEIVE A CALL BACK THE SAME DAY AS LONG AS YOU CALL BEFORE 4:00 PM ? ? ?

## 2021-09-03 ENCOUNTER — Other Ambulatory Visit (HOSPITAL_COMMUNITY): Payer: Self-pay

## 2021-09-10 ENCOUNTER — Telehealth (HOSPITAL_COMMUNITY): Payer: Self-pay | Admitting: Licensed Clinical Social Worker

## 2021-09-10 ENCOUNTER — Other Ambulatory Visit (HOSPITAL_COMMUNITY): Payer: Self-pay

## 2021-09-10 NOTE — Telephone Encounter (Signed)
Extra Help application completed with patient- informed he will hear from [facility] in about 1 month regarding approval or denial of assistance. ? ?Will continue to follow and assist as needed ? ?Burna Sis, LCSW ?Clinical Social Worker ?Advanced [facility] Failure Clinic ?Desk#: [phone] ?Cell#: [phone] ? ?

## 2021-09-10 NOTE — Telephone Encounter (Signed)
Advanced Heart Failure Patient Advocate Encounter ? ?Spoke with patient. He is aware that the [facility] specialty pharmacy will call and assess adherence and any potential side effects each month before mailing the RX to his home. ? ?Will mail the first fill on Monday 04/24. ? ?Charlann Boxer, CPhT ? ?

## 2021-09-10 NOTE — Telephone Encounter (Signed)
CSW consulted to speak with pt regarding Extra Help application to help with medication costs.  CSW attempted to call pt- unable to reach and unable to leave VM- will attempt again later ? ?Burna Sis, LCSW ?Clinical Social Worker ?Advanced Heart Failure Clinic ?Desk#: [phone] ?Cell#: [phone] ? ?

## 2021-09-17 ENCOUNTER — Other Ambulatory Visit (HOSPITAL_COMMUNITY): Payer: Self-pay

## 2021-09-20 ENCOUNTER — Emergency Department (HOSPITAL_COMMUNITY)
Admission: EM | Admit: 2021-09-20 | Discharge: 2021-09-21 | Disposition: A | Discharge status: Home / Self Care | Payer: Medicare Other | Attending: Student | Admitting: Student

## 2021-09-20 ENCOUNTER — Other Ambulatory Visit: Payer: Self-pay

## 2021-09-20 ENCOUNTER — Encounter (HOSPITAL_COMMUNITY): Payer: Self-pay

## 2021-09-20 DIAGNOSIS — Z7982 Long term (current) use of aspirin: Secondary | ICD-10-CM | POA: Insufficient documentation

## 2021-09-20 DIAGNOSIS — L42 Pityriasis rosea: Secondary | ICD-10-CM | POA: Diagnosis not present

## 2021-09-20 DIAGNOSIS — M795 Residual foreign body in soft tissue: Secondary | ICD-10-CM | POA: Insufficient documentation

## 2021-09-20 DIAGNOSIS — R21 Rash and other nonspecific skin eruption: Secondary | ICD-10-CM | POA: Diagnosis present

## 2021-09-20 DIAGNOSIS — I10 Essential (primary) hypertension: Secondary | ICD-10-CM | POA: Diagnosis not present

## 2021-09-20 DIAGNOSIS — L299 Pruritus, unspecified: Secondary | ICD-10-CM | POA: Diagnosis not present

## 2021-09-20 DIAGNOSIS — M79604 Pain in right leg: Secondary | ICD-10-CM | POA: Insufficient documentation

## 2021-09-20 MED ORDER — HYDROXYZINE HCL 25 MG PO TABS
25.0000 mg | ORAL_TABLET | Freq: Once | ORAL | Status: AC
  Administered 2021-09-20: 25 mg via ORAL
  Filled 2021-09-20: qty 1

## 2021-09-20 MED ORDER — HYDROXYZINE HCL 25 MG PO TABS: 25.0000 mg | ORAL_TABLET | Freq: Four times a day (QID) | ORAL | 0 refills | Status: DC

## 2021-09-20 MED ORDER — DIPHENHYDRAMINE HCL 25 MG PO CAPS
25.0000 mg | ORAL_CAPSULE | Freq: Once | ORAL | Status: AC
  Administered 2021-09-20: 25 mg via ORAL
  Filled 2021-09-20: qty 1

## 2021-09-20 MED ORDER — HYDROCORTISONE 1 % EX CREA: TOPICAL_CREAM | CUTANEOUS | 0 refills | Status: DC

## 2021-09-20 NOTE — ED Notes (Signed)
E-signature pad unavailable at time of pt discharge. This RN discussed discharge materials with pt and answered all pt questions. Pt stated understanding of discharge material. ? ?

## 2021-09-20 NOTE — Discharge Instructions (Signed)
Pityriasis Rosea: Care Instructions ? ?Skip Navigation  ?Overview ?Pityriasis rosea (say "pit-uh-RY-uh-sus ROH-zee-uh") is a common skin rash. It usually starts as one scaly and pinkish, purple, or red-brown patch on your stomach or back. Days or weeks later, more small patches appear. The rash may itch, but it will not spread to other people. ?Experts aren't sure what causes pityriasis rosea. It may be caused by a virus. ?Pityriasis rosea is most common in children and young adults. It lasts 1 to 3 months and then goes away on its own. Medicine can help relieve any itching. ?Follow-up care is a key part of your treatment and safety. Be sure to make and go to all appointments, and call your doctor if you are having problems. It's also a good idea to know your test results and keep a list of the medicines you take. ? ?How can you care for yourself at home? ?If your doctor prescribed medicine, use it exactly as prescribed. Call your doctor if you have any problems with your medicine. ?Use a mild soap or a gentle skin cleanser when you wash your skin. ?Add a handful of oatmeal (ground to a powder) to your bath. Or you can try an oatmeal bath product, such as Aveeno. Keep the water warm or lukewarm. A hot bath or shower may make the rash more visible and itchy. ?Try an over-the-counter 1% hydrocortisone cream for small itchy areas. Use the cream very sparingly on the face or genitals. Note: Do not use the cream on children younger than age 38 unless your doctor tells you to. Do not use in the rectal or vaginal area in children younger than age 55 unless your doctor tells you to. ? ? ?When should you call for help? ? ?Call your doctor now or seek immediate medical care if: ?You have signs of infection such as: ?Pain, warmth, or swelling near the rash. ?Red streaks near the rash. ?Pus coming from the rash. ?A fever. ?Watch closely for changes in your health, and be sure to contact your doctor if: ?You see the rash on the  palms of your hands or the soles of your feet. ?You do not get better as expected. ? ? ?

## 2021-09-20 NOTE — ED Provider Notes (Signed)
?[facility] ?Provider Note ? ? ?CSN: [id number] ?Arrival date & time: 09/20/21  2027 ? ?  ? ?History ? ?Chief Complaint  ?Patient presents with  ? Foreign Body in Skin  ? ? ?Thomas West is a 61 y.o. male. ? ?HPI ? ?Patient is a 61 year old male with multiple medical history as listed below presents to the emergency department for whole back itching, and diffuse back sharp pain and lower right leg pain.  He reports his phone screen was shattered and he had small glass lying on the couch.  He thinks he laid his body on the couch and has been having small piece of glass all over his body.  He reports his son has been removing the small piece of glass.  This happened 2 days ago.  He also have associated back rash that has been itching.  He report he has been taking multiple showers every day without symptom improvement.  I have called his son who report has been removing small piece of glass from his back.  Patient denies any substance use.  He does smoke THC on and off.  He does not remember when the rash started in his back.  Otherwise no other complaints. ? ?Home Medications ?Prior to Admission medications   ?Medication Sig Start Date End Date Taking? Authorizing Provider  ?aspirin 81 MG EC tablet Take 1 tablet (81 mg total) by mouth daily. 07/15/21 07/10/22  Kayleen Memos, DO  ?atorvastatin (LIPITOR) 80 MG tablet Take 1 tablet (80 mg total) by mouth daily. 07/15/21 10/13/21  Kayleen Memos, DO  ?carvedilol (COREG) 6.25 MG tablet Take 1 tablet (6.25 mg total) by mouth EVERY 12 HOURS (10AM &10PM) 07/15/21 10/13/21  Kayleen Memos, DO  ?clopidogrel (PLAVIX) 75 MG tablet Take 1 tablet (75 mg total) by mouth daily. 07/15/21 10/13/21  Kayleen Memos, DO  ?furosemide (LASIX) 40 MG tablet Take 1 tablet (40 mg total) by mouth daily. 07/15/21 10/13/21  Kayleen Memos, DO  ?gabapentin (NEURONTIN) 400 MG capsule Take 1 capsule (400 mg total) by mouth 2 (two) times daily. 08/18/21   Prosperi,  Christian H, PA-C  ?hydrALAZINE (APRESOLINE) 50 MG tablet Take 1 tablet (50 mg total) by mouth 3 (three) times daily. 07/15/21 10/13/21  Kayleen Memos, DO  ?hydrocortisone cream 1 % Apply to affected area 2 times daily 09/20/21   Donnamarie Poag, MD  ?hydrOXYzine (ATARAX) 25 MG tablet Take 1 tablet (25 mg total) by mouth every 6 (six) hours. 09/20/21   Donnamarie Poag, MD  ?isosorbide mononitrate (IMDUR) 30 MG 24 hr tablet Take 1 tablet (30 mg total) by mouth daily. 09/02/21   Bensimhon, Shaune Pascal, MD  ?methocarbamol (ROBAXIN) 500 MG tablet Take 1 tablet (500 mg total) by mouth 2 (two) times daily. 08/18/21   Prosperi, Christian H, PA-C  ?potassium chloride (KLOR-CON M) 10 MEQ tablet Take 1 tablet (10 mEq total) by mouth daily. 07/15/21 10/13/21  Kayleen Memos, DO  ?predniSONE (DELTASONE) 20 MG tablet Take 2 tablets (40 mg total) by mouth daily. 08/18/21   Prosperi, Christian H, PA-C  ?sacubitril-valsartan (ENTRESTO) 97-103 MG Take 1 tablet by mouth 2 (two) times daily. 09/02/21   Bensimhon, Shaune Pascal, MD  ?sitaGLIPtin (JANUVIA) 25 MG tablet Take 1 tablet (25 mg total) by mouth daily. 07/15/21 10/13/21  Kayleen Memos, DO  ?spironolactone (ALDACTONE) 25 MG tablet Take 1 tablet (25 mg total) by mouth daily. 07/15/21 10/13/21  Kayleen Memos,  DO  ?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 Take 61 mg by mouth daily. 09/02/21   Bensimhon, Shaune Pascal, MD  ?   ? ?Allergies    ?Bee venom   ? ?Review of Systems   ?Review of Systems ? ?Physical Exam ?Updated Vital Signs ?BP (!) 188/114   Pulse 85   Temp 98.9 ?F (37.2 ?C) (Oral)   Resp 19   SpO2 100%  ?Physical Exam ?Vitals and nursing note reviewed.  ?Constitutional:   ?   General: He is not in acute distress. ?   Appearance: He is well-developed.  ?HENT:  ?   Head: Normocephalic and atraumatic.  ?Eyes:  ?   Conjunctiva/sclera: Conjunctivae normal.  ?Cardiovascular:  ?   Rate and Rhythm: Normal rate and regular rhythm.  ?   Heart sounds: No murmur heard. ?Pulmonary:  ?   Effort: Pulmonary effort is  normal. No respiratory distress.  ?   Breath sounds: Normal breath sounds.  ?Abdominal:  ?   Palpations: Abdomen is soft.  ?   Tenderness: There is no abdominal tenderness.  ?Musculoskeletal:     ?   General: No swelling.  ?   Cervical back: Neck supple.  ?Skin: ?   General: Skin is warm and dry.  ?   Capillary Refill: Capillary refill takes less than 2 seconds.  ?   Findings: Rash present.  ?   Comments: Patient does have diffuse rash in the back in a Christmas pattern.  Rash is extending toward the lateral aspect.  ?Neurological:  ?   Mental Status: He is alert.  ?Psychiatric:     ?   Mood and Affect: Mood normal.  ? ? ?ED Results / Procedures / Treatments   ?Labs ?(all labs ordered are listed, but only abnormal results are displayed) ?Labs Reviewed - No data to display ? ?EKG ?None ? ?Radiology ?No results found. ? ?Procedures ?Procedures  ? ?Medications Ordered in ED ?Medications  ?hydrOXYzine (ATARAX) tablet 25 mg (25 mg Oral Given 09/20/21 2216)  ?diphenhydrAMINE (BENADRYL) capsule 25 mg (25 mg Oral Given 09/20/21 2216)  ? ? ?ED Course/ Medical Decision Making/ A&P ?  ?                        ?Medical Decision Making ?Problems Addressed: ?Foreign body (FB) in soft tissue: acute illness or injury that poses a threat to life or bodily functions ?Pityriasis rosea: acute illness or injury that poses a threat to life or bodily functions ? ?Risk ?Prescription drug management. ? ? ?Patient is a 61 year old male who presents the emergency department due to concern for pruritic rash and due to concern for glass stuck in his back.  His physical exam as above.  His vital signs remarkable for mildly elevated blood pressure.  Patient rash is consistent with pityriasis rosea.  Less concern for other serious skin infectious etiology at this time.  We will treat with Atarax and hydrocortisone cream.  In regards to the small piece of glass in his back we have attempted to pull the piece of glass with tape without success.   However I believe this will come out in time.  There is no visible glass at this time.  Patient does report significant improvement after treatment with Atarax and Benadryl.  We will send patient with hydrocortisone cream and Atarax for pruritic treatment.  Return precaution has been discussed. ? ?Final Clinical Impression(s) / ED Diagnoses ?Final diagnoses:  ?Pityriasis rosea  ?Foreign body (  FB) in soft tissue  ? ? ?Rx / DC Orders ?ED Discharge Orders   ? ?      Ordered  ?  hydrocortisone cream 1 %  Status:  Discontinued       ? 09/20/21 2239  ?  hydrOXYzine (ATARAX) 25 MG tablet  Every 6 hours,   Status:  Discontinued       ? 09/20/21 2300  ?  hydrOXYzine (ATARAX) 25 MG tablet  Every 6 hours,   Status:  Discontinued       ? 09/20/21 2300  ?  hydrocortisone cream 1 %       ? 09/20/21 2326  ?  hydrOXYzine (ATARAX) 25 MG tablet  Every 6 hours       ? 09/20/21 2326  ? ?  ?  ? ?  ? ? ?  ?Donnamarie Poag, MD ?09/20/21 2338 ? ?  ?Teressa Lower, MD ?09/24/21 [phone] ? ?

## 2021-09-20 NOTE — ED Notes (Signed)
Cab voucher provided to pt for ride home. [place] called for pick-up ?

## 2021-09-20 NOTE — ED Triage Notes (Signed)
Pt arrived via EMS from home c/o numbness, tingling, and itching in his back, legs, and arms. Pt stated he broke his phone screen a few days ago and got the screen glass on the couch didn't realize it and laid down, pt stated he still has glass in his back and legs, he stated he's been trying cold showers for the past 2 days but continues seeing glass in his skin  ?

## 2021-09-21 ENCOUNTER — Other Ambulatory Visit (HOSPITAL_COMMUNITY): Payer: Self-pay

## 2021-10-01 ENCOUNTER — Other Ambulatory Visit (HOSPITAL_COMMUNITY): Payer: Self-pay

## 2021-10-05 ENCOUNTER — Encounter (HOSPITAL_COMMUNITY): Payer: Self-pay | Admitting: Emergency Medicine

## 2021-10-05 ENCOUNTER — Emergency Department (HOSPITAL_COMMUNITY)
Admission: EM | Admit: 2021-10-05 | Discharge: 2021-10-05 | Disposition: A | Discharge status: Home / Self Care | Payer: Medicare Other | Attending: Emergency Medicine | Admitting: Emergency Medicine

## 2021-10-05 ENCOUNTER — Emergency Department (HOSPITAL_COMMUNITY): Payer: Medicare Other

## 2021-10-05 DIAGNOSIS — M79605 Pain in left leg: Secondary | ICD-10-CM | POA: Diagnosis not present

## 2021-10-05 DIAGNOSIS — M7989 Other specified soft tissue disorders: Secondary | ICD-10-CM | POA: Diagnosis not present

## 2021-10-05 DIAGNOSIS — M79604 Pain in right leg: Secondary | ICD-10-CM | POA: Diagnosis not present

## 2021-10-05 DIAGNOSIS — E119 Type 2 diabetes mellitus without complications: Secondary | ICD-10-CM | POA: Insufficient documentation

## 2021-10-05 DIAGNOSIS — Z87891 Personal history of nicotine dependence: Secondary | ICD-10-CM | POA: Diagnosis not present

## 2021-10-05 DIAGNOSIS — R262 Difficulty in walking, not elsewhere classified: Secondary | ICD-10-CM | POA: Diagnosis not present

## 2021-10-05 DIAGNOSIS — I10 Essential (primary) hypertension: Secondary | ICD-10-CM | POA: Diagnosis not present

## 2021-10-05 DIAGNOSIS — I110 Hypertensive heart disease with heart failure: Secondary | ICD-10-CM | POA: Insufficient documentation

## 2021-10-05 DIAGNOSIS — I2510 Atherosclerotic heart disease of native coronary artery without angina pectoris: Secondary | ICD-10-CM | POA: Insufficient documentation

## 2021-10-05 DIAGNOSIS — I509 Heart failure, unspecified: Secondary | ICD-10-CM | POA: Insufficient documentation

## 2021-10-05 DIAGNOSIS — R079 Chest pain, unspecified: Secondary | ICD-10-CM | POA: Diagnosis not present

## 2021-10-05 DIAGNOSIS — R609 Edema, unspecified: Secondary | ICD-10-CM | POA: Diagnosis not present

## 2021-10-05 LAB — COMPREHENSIVE METABOLIC PANEL
ALT: 17 U/L (ref 0–44)
AST: 21 U/L (ref 15–41)
Albumin: 3.7 g/dL (ref 3.5–5.0)
Alkaline Phosphatase: 83 U/L (ref 38–126)
Anion gap: 7 (ref 5–15)
BUN: 12 mg/dL (ref 6–20)
CO2: 26 mmol/L (ref 22–32)
Calcium: 8.6 mg/dL — ABNORMAL LOW (ref 8.9–10.3)
Chloride: 106 mmol/L (ref 98–111)
Creatinine, Ser: 1.19 mg/dL (ref 0.61–1.24)
GFR, Estimated: >60 mL/min (ref >60)
Glucose, Bld: 138 mg/dL — ABNORMAL HIGH (ref 70–99)
Potassium: 4.1 mmol/L (ref 3.5–5.1)
Sodium: 139 mmol/L (ref 135–145)
Total Bilirubin: 0.7 mg/dL (ref 0.3–1.2)
Total Protein: 7.8 g/dL (ref 6.5–8.1)

## 2021-10-05 LAB — TROPONIN I (HIGH SENSITIVITY): Troponin I (High Sensitivity): 16 ng/L (ref <18)

## 2021-10-05 LAB — CBC
HCT: 43.1 % (ref 39.0–52.0)
Hemoglobin: 14.2 g/dL (ref 13.0–17.0)
MCH: 28.2 pg (ref 26.0–34.0)
MCHC: 32.9 g/dL (ref 30.0–36.0)
MCV: 85.5 fL (ref 80.0–100.0)
Platelets: 324 10*3/uL (ref 150–400)
RBC: 5.04 MIL/uL (ref 4.22–5.81)
RDW: 17.1 % — ABNORMAL HIGH (ref 11.5–15.5)
WBC: 8.7 10*3/uL (ref 4.0–10.5)
nRBC: 0 % (ref 0.0–0.2)

## 2021-10-05 LAB — BRAIN NATRIURETIC PEPTIDE: B Natriuretic Peptide: 655 pg/mL — ABNORMAL HIGH (ref 0.0–100.0)

## 2021-10-05 MED ORDER — HYDRALAZINE HCL 25 MG PO TABS
50.0000 mg | ORAL_TABLET | Freq: Three times a day (TID) | ORAL | Status: DC
Start: 2021-10-05 — End: 2021-10-05
  Administered 2021-10-05: 50 mg via ORAL
  Filled 2021-10-05: qty 2

## 2021-10-05 MED ORDER — GABAPENTIN 300 MG PO CAPS
400.0000 mg | ORAL_CAPSULE | Freq: Two times a day (BID) | ORAL | Status: DC
Start: 2021-10-05 — End: 2021-10-05
  Administered 2021-10-05: 400 mg via ORAL
  Filled 2021-10-05: qty 1

## 2021-10-05 MED ORDER — FUROSEMIDE 40 MG PO TABS
40.0000 mg | ORAL_TABLET | Freq: Every day | ORAL | Status: DC
  Administered 2021-10-05: 40 mg via ORAL
  Filled 2021-10-05: qty 1

## 2021-10-05 MED ORDER — CARVEDILOL 3.125 MG PO TABS
6.2500 mg | ORAL_TABLET | Freq: Two times a day (BID) | ORAL | Status: DC
  Administered 2021-10-05: 6.25 mg via ORAL
  Filled 2021-10-05: qty 2

## 2021-10-05 NOTE — ED Notes (Signed)
Pt transported to cab via w/c ?

## 2021-10-05 NOTE — Discharge Instructions (Signed)
You were evaluated in the Emergency Department and after careful evaluation, we did not find any emergent condition requiring admission or further testing in the hospital. ? ?Your exam/testing today was overall reassuring.  Symptoms likely due to some fluid retention due to not being on your medicines.  Start taking your medicines as directed and follow-up closely with your regular doctor. ? ?Please return to the Emergency Department if you experience any worsening of your condition.  Thank you for allowing [place] to be a part of your care. ? ?

## 2021-10-05 NOTE — ED Provider Notes (Signed)
?[place] DEPT ?[facility] Emergency Department ?Provider Note ?MRN:  [id number]  ?Arrival date & time: 10/05/21    ? ?Chief Complaint   ?Leg Pain ?  ?History of Present Illness   ?Thomas West is a 61 y.o. year-old male with a history of hypertension, stroke, seizure presenting to the ED with chief complaint of leg pain. ? ?Leg pain and swelling for the past week.  Ran out of his medicines because they did not come in the mail.  Endorsing shortness of breath, some chest pain today. ? ?Review of Systems  ?A thorough review of systems was obtained and all systems are negative except as noted in the HPI and PMH.  ? ?Patient's Health History   ? ?Past Medical History:  ?Diagnosis Date  ? CHF (congestive heart failure) ([place])   ? Coronary artery disease   ? Diabetes mellitus without complication ([place])   ? History of MI (myocardial infarction) 03/05/2019  ? Hypertension   ? Stroke [facility])   ?  ?Past Surgical History:  ?Procedure Laterality Date  ? leg surgery    ? "pins in left shin"  ? RIGHT/LEFT HEART CATH AND CORONARY ANGIOGRAPHY N/A 03/11/2021  ? Procedure: RIGHT/LEFT HEART CATH AND CORONARY ANGIOGRAPHY;  Surgeon: Early Osmond, MD;  Location: [place] [facility];  Service: Cardiovascular;  Laterality: N/A;  ?  ?History reviewed. No pertinent family history.  ?Social History  ? ?Socioeconomic History  ? Marital status: Single  ?  Spouse name: Not on file  ? Number of children: Not on file  ? Years of education: Not on file  ? Highest education level: Not on file  ?Occupational History  ? Not on file  ?Tobacco Use  ? Smoking status: Former  ?  Types: Cigarettes  ?  Quit date: 10/06/2017  ?  Years since quitting: 4.0  ? Smokeless tobacco: Never  ?Substance and Sexual Activity  ? Alcohol use: Not Currently  ? Drug use: Yes  ?  Types: Marijuana  ? Sexual activity: Not on file  ?Other Topics Concern  ? Not on file  ?Social History Narrative  ? Not on file  ? ?Social Determinants of Health  ? ?Financial Resource  Strain: Low Risk   ? Difficulty of Paying Living Expenses: Not very hard  ?Food Insecurity: No Food Insecurity  ? Worried About Charity fundraiser in the Last Year: Never true  ? Ran Out of Food in the Last Year: Never true  ?Transportation Needs: Unmet Transportation Needs  ? Lack of Transportation (Medical): Yes  ? Lack of Transportation (Non-Medical): Yes  ?Physical Activity: Not on file  ?Stress: Not on file  ?Social Connections: Not on file  ?Intimate Partner Violence: Not on file  ?  ? ?Physical Exam  ? ?Vitals:  ? 10/05/21 0545 10/05/21 0630  ?BP: (!) 191/103 (!) 161/101  ?Pulse: 80 80  ?Resp: 14 17  ?Temp:    ?SpO2: 100% 97%  ?  ?CONSTITUTIONAL: Well-appearing, NAD ?NEURO/PSYCH:  Alert and oriented x 3, no focal deficits ?EYES:  eyes equal and reactive ?ENT/NECK:  no LAD, no JVD ?CARDIO: Regular rate, well-perfused, normal S1 and S2 ?PULM:  CTAB no wheezing or rhonchi ?GI/GU:  non-distended, non-tender ?MSK/SPINE:  No gross deformities, 1+ edema to bilateral lower extremities ?SKIN:  no rash, atraumatic ? ? ?*Additional and/or pertinent findings included in MDM below ? ?Diagnostic and Interventional Summary  ? ? EKG Interpretation ? ?Date/Time:  Monday Oct 05 2021 05:15:24 EDT ?Ventricular Rate:  82 ?PR Interval:  137 ?QRS Duration: 86 ?QT Interval:  392 ?QTC Calculation: 458 ?R Axis:   105 ?Text Interpretation: Sinus rhythm Right axis deviation Consider left ventricular hypertrophy Abnormal T, consider ischemia, lateral leads No significant change was found Confirmed by Gerlene Fee [phone]) on 10/05/2021 5:37:44 AM ?  ? ?  ? ?Labs Reviewed  ?CBC - Abnormal; Notable for the following components:  ?    Result Value  ? RDW 17.1 (*)   ? All other components within normal limits  ?COMPREHENSIVE METABOLIC PANEL - Abnormal; Notable for the following components:  ? Glucose, Bld 138 (*)   ? Calcium 8.6 (*)   ? All other components within normal limits  ?BRAIN NATRIURETIC PEPTIDE - Abnormal; Notable for the  following components:  ? B Natriuretic Peptide 655.0 (*)   ? All other components within normal limits  ?TROPONIN I (HIGH SENSITIVITY)  ?TROPONIN I (HIGH SENSITIVITY)  ?  ?DG Chest Port 1 View  ?Final Result  ?  ?  ?Medications  ?furosemide (LASIX) tablet 40 mg (40 mg Oral Given 10/05/21 0517)  ?gabapentin (NEURONTIN) capsule 400 mg (400 mg Oral Given 10/05/21 0517)  ?hydrALAZINE (APRESOLINE) tablet 50 mg (50 mg Oral Given 10/05/21 0517)  ?carvedilol (COREG) tablet 6.25 mg (6.25 mg Oral Given 10/05/21 0517)  ?  ? ?Procedures  /  Critical Care ?Procedures ? ?ED Course and Medical Decision Making  ?Initial Impression and Ddx ?Suspect leg swelling in the setting of CHF and not having Lasix at home.  Does not seem to be in respiratory distress, sitting comfortably.  Is endorsing some chest pain or shortness of breath, and so will obtain screening labs, EKG, chest x-ray to evaluate for pulmonary edema, ACS, signs of heart strain. ? ?Past medical/surgical history that increases complexity of ED encounter: CHF ? ?Interpretation of Diagnostics ?I personally reviewed the EKG and my interpretation is as follows: Sinus rhythm, T wave inversions, unchanged from prior ?   ?Labs reveal no significant blood count or electrolyte disturbance.  Troponin is negative.  BNP is elevated mildly.  Chest pain has been present for several days and so repeat troponin not indicated. ? ?Patient Reassessment and Ultimate Disposition/Management ?Patient continues to look and feel well with normal vital signs, no increased work of breathing.  Patient given his home medications with good urine output, good blood pressure response.  No indication for further testing or admission, patient explains that his medications are arriving later today and he will start taking them as directed.  Appropriate for discharge. ? ?Patient management required discussion with the following services or consulting groups:  None ? ?Complexity of Problems Addressed ?Acute  illness or injury that poses threat of life of bodily function ? ?Additional Data Reviewed and Analyzed ?Further history obtained from: ?Prior labs/imaging results ? ?Additional Factors Impacting ED Encounter Risk ?Consideration of hospitalization ? ?Barth Kirks. Sedonia Small, MD ?[facility] Emergency Medicine ?[email or web address] ? ?Final Clinical Impressions(s) / ED Diagnoses  ? ?  ICD-10-CM   ?1. Leg swelling  M79.89   ?  ?  ?ED Discharge Orders   ? ? None  ? ?  ?  ? ?Discharge Instructions Discussed with and Provided to Patient:  ? ? ? ?Discharge Instructions   ? ?  ?You were evaluated in the Emergency Department and after careful evaluation, we did not find any emergent condition requiring admission or further testing in the hospital. ? ?Your exam/testing today was overall reassuring.  Symptoms likely  due to some fluid retention due to not being on your medicines.  Start taking your medicines as directed and follow-up closely with your regular doctor. ? ?Please return to the Emergency Department if you experience any worsening of your condition.  Thank you for allowing [place] to be a part of your care. ? ? ? ? ? ?  ?Maudie Flakes, MD ?10/05/21 [phone] ? ?

## 2021-10-05 NOTE — ED Triage Notes (Signed)
Pt reports that he has been out of his lasix and gabapentin for about a week. Reports neuropathy pain bilateral from feet to hips. No SOB.   ?

## 2021-10-06 ENCOUNTER — Other Ambulatory Visit (HOSPITAL_COMMUNITY): Payer: Self-pay | Admitting: Pharmacist

## 2021-10-06 MED ORDER — HYDROCORTISONE 1 % EX CREA: TOPICAL_CREAM | CUTANEOUS | 0 refills | Status: DC

## 2021-10-08 ENCOUNTER — Other Ambulatory Visit (HOSPITAL_COMMUNITY): Payer: Self-pay

## 2021-10-12 ENCOUNTER — Other Ambulatory Visit (HOSPITAL_COMMUNITY): Payer: Self-pay

## 2021-10-12 ENCOUNTER — Other Ambulatory Visit (HOSPITAL_COMMUNITY): Payer: Self-pay | Admitting: Internal Medicine

## 2021-10-30 ENCOUNTER — Telehealth (HOSPITAL_COMMUNITY): Payer: Self-pay

## 2021-10-30 NOTE — Telephone Encounter (Signed)
Called to confirm/remind patient of their appointment at the [facility] on 11/02/21.   Patient reminded to bring all medications and/or complete list.  Confirmed patient has transportation. Gave directions, instructed to utilize valet parking.  Confirmed appointment prior to ending call.

## 2021-11-02 ENCOUNTER — Ambulatory Visit (HOSPITAL_COMMUNITY)
Admission: RE | Admit: 2021-11-02 | Discharge: 2021-11-02 | Disposition: A | Discharge status: Home / Self Care | Payer: Medicare Other | Source: Ambulatory Visit | Attending: Family Medicine | Admitting: Family Medicine

## 2021-11-02 DIAGNOSIS — I5020 Unspecified systolic (congestive) heart failure: Secondary | ICD-10-CM | POA: Diagnosis not present

## 2021-11-02 DIAGNOSIS — I5022 Chronic systolic (congestive) heart failure: Secondary | ICD-10-CM | POA: Diagnosis not present

## 2021-11-02 DIAGNOSIS — E119 Type 2 diabetes mellitus without complications: Secondary | ICD-10-CM | POA: Diagnosis not present

## 2021-11-02 DIAGNOSIS — I252 Old myocardial infarction: Secondary | ICD-10-CM | POA: Diagnosis not present

## 2021-11-02 DIAGNOSIS — I2510 Atherosclerotic heart disease of native coronary artery without angina pectoris: Secondary | ICD-10-CM | POA: Insufficient documentation

## 2021-11-02 DIAGNOSIS — I110 Hypertensive heart disease with heart failure: Secondary | ICD-10-CM | POA: Insufficient documentation

## 2021-11-02 DIAGNOSIS — I082 Rheumatic disorders of both aortic and tricuspid valves: Secondary | ICD-10-CM | POA: Insufficient documentation

## 2021-11-02 LAB — ECHOCARDIOGRAM COMPLETE
Area-P 1/2: 4.41 cm2
Calc EF: 31.8 %
S' Lateral: 4.2 cm
Single Plane A2C EF: 31.6 %
Single Plane A4C EF: 31.5 %

## 2021-11-02 NOTE — Progress Notes (Incomplete)
ADVANCED HF CLINIC  NOTE   Primary Care: [name], [name], MD HF Cardiologist: Dr. Haroldine Laws  HPI: Thomas West is a 61 y.o. male with history of CAD with prior MI and stent in 01/2019 at [facility] in [place] (report not available), cardiomyopathy/chronic systolic HF, uncontrolled DM II, HTN, hx CVA on chart review, CKD.    Moved to [place] from [place] 2 years ago.   Had not medical f/u until he was admitted in 58/85 with a/c systolic HF. Had been out of cardiac medications. Diuresed with IV lasix then transitioned to po lasix 40 mg daily. Started on GDMT with carvedilol, hydralazine, imdur and empagliflozin.    Echo 10/22 with EF 20-25%, RV okay, trivial MR   [facility] 10/22 with nonobstructive CAD and patent RPLV stent. RA mean 5 mmHg, PCWP mean 5 mmHg, Fick CO 7.46/CI 3.85.   He did not show up for [facility] appointment after discharge.   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  Admitted 12/22 with a/c CHF due to noncompliance with GDMT. cMRI strongly suggestive of amyloidosis, GDMT titrated. Admitted 2/23 with a/c CHF after running out of meds x 1 week. Given IV lasix, GDMT restarted. Imdur held with low BP. Seen in ED 3/23 with back pain, felt to be related to MSK. Seen in ED 08/18/21 with SOB, cardiac work up unrevealing.  Today he returns for HF follow up. Main complaint is neuropathy. Denies SOB, orthopnea or PND. 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 Has been on disability the last few years due to medical conditions. Lives with his 65 year old son. No ETOH/drugs. Smoking occasionally. He says transportation issues have prevented close follow up.  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   - cMRI (11/22): LVEF 24% RVEF 21% LGE and ECW suggestive of cardiac  amyloidosis    Past Medical History:  Diagnosis Date   CHF (congestive heart failure) (HCC)    Coronary artery disease    Diabetes mellitus without complication (HCC)    History of MI (myocardial infarction) 03/05/2019   Hypertension    Stroke [facility]    Current Outpatient Medications  Medication Sig Dispense Refill   aspirin 81 MG EC tablet Take 1 tablet (81 mg total) by mouth daily. 360 tablet 0   atorvastatin (LIPITOR) 80 MG tablet Take 1 tablet (80 mg total) by mouth daily. 90 tablet 0   carvedilol (COREG) 6.25 MG tablet Take 1 tablet (6.25 mg total) by mouth EVERY 12 HOURS (10AM &10PM) 180 tablet 0   furosemide (LASIX) 40 MG tablet Take 1 tablet (40 mg total) by mouth daily. 90 tablet 0   gabapentin (NEURONTIN) 400 MG capsule Take 1 capsule (400 mg total) by mouth 2 (two) times daily. 60 capsule 0   hydrALAZINE (APRESOLINE) 50 MG tablet Take 1 tablet (50 mg total) by mouth 3 (three) times daily. 270 tablet 0   hydrocortisone cream 1 % Apply to affected area 2 times daily 30 g 0   hydrOXYzine (ATARAX) 25 MG tablet Take 1 tablet (25 mg total) by mouth every 6 (six) hours. 30 tablet 0   isosorbide mononitrate (IMDUR) 30 MG 24 hr tablet Take 1 tablet (30 mg total) by mouth daily. 90 tablet 3   methocarbamol (ROBAXIN) 500 MG tablet Take 1  tablet (500 mg total) by mouth 2 (two) times daily. 20 tablet 0   potassium chloride (KLOR-CON M) 10 MEQ tablet Take 1 tablet (10 mEq total) by mouth daily. 90 tablet 0   predniSONE (DELTASONE) 20 MG tablet Take 2 tablets (40 mg total) by mouth daily. 10 tablet 0   sacubitril-valsartan (ENTRESTO) 97-103 MG Take 1 tablet by mouth 2 (two) times daily. 60 tablet 11   sitaGLIPtin (JANUVIA) 25 MG tablet Take 1 tablet (25 mg total) by mouth daily. 90 tablet 0   spironolactone (ALDACTONE) 25 MG tablet Take 1 tablet (25 mg total) by mouth daily. 90 tablet 0   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 Take 61 mg by mouth daily. 30 capsule 11   No current facility-administered  medications for this visit.   Allergies  Allergen Reactions   Bee Venom     swelling   Social History   Socioeconomic History   Marital status: Single    Spouse name: Not on file   Number of children: Not on file   Years of education: Not on file   Highest education level: Not on file  Occupational History   Not on file  Tobacco Use   Smoking status: Former    Types: Cigarettes    Quit date: 10/06/2017    Years since quitting: 4.0   Smokeless tobacco: Never  Substance and Sexual Activity   Alcohol use: Not Currently   Drug use: Yes    Types: Marijuana   Sexual activity: Not on file  Other Topics Concern   Not on file  Social History Narrative   Not on file   Social Determinants of Health   Financial Resource Strain: Low Risk  (03/10/2021)   Overall Financial Resource Strain (CARDIA)    Difficulty of Paying Living Expenses: Not very hard  Food Insecurity: No Food Insecurity (03/10/2021)   Hunger Vital Sign    Worried About Running Out of Food in the Last Year: Never true    Ran Out of Food in the Last Year: Never true  Transportation Needs: Unmet Transportation Needs (08/25/2021)   PRAPARE - Hydrologist (Medical): Yes    Lack of Transportation (Non-Medical): Yes  Physical Activity: Not on file  Stress: Not on file  Social Connections: Not on file  Intimate Partner Violence: Not on file    No family history on file.  There were no vitals taken for this visit.  Wt Readings from Last 3 Encounters:  10/05/21 70.3 kg (155 lb)  09/02/21 67.6 kg (149 lb)  08/25/21 67.9 kg (149 lb 12.8 oz)   PHYSICAL EXAM: General:  NAD. No resp difficulty, arrived in [facility], chronically-ill appearing. HEENT: normal Neck: supple. no JVD. Carotids 2+ bilat; no bruits. No lymphadenopathy or thryomegaly appreciated. Cor: PMI nondisplaced. Regular rate & rhythm. No rubs, gallops or murmurs. Lungs: clear Abdomen: soft, nontender, nondistended. No  hepatosplenomegaly. No bruits or masses. Good bowel sounds. Extremities: no cyanosis, clubbing, rash, edema Neuro: alert & orientedx3, cranial nerves grossly intact. moves all 4 extremities w/o difficulty. Affect pleasant  ASSESSMENT & PLAN: Chronic systolic HF/cardiac amyloidosis - Onset not certain. He reports cardiomyopathy diagnosed just prior to MI while living in [place] a couple of years ago. - Echo (10/22): EF 20-25%, RV ok - R/LHC (10/22): Patent RPLV stent with nonobstructive disease elsewhere, Preserved CO/CI, RA mean 5 mmHg, PCWP mean 5 mmHg - cMRI (11/22): LVEF 24% RVEF 21% LGE and ECW suggestive of cardiac amyloidosis. -  PYP (12/22) read as markedly positive. I thought equivocal but based on cMRI likely positive Will need genetic testing. - Multiple myeloma panel (12/22) with no m-spike. - NYHA II today, physically limited by neuropathy in feet. Volume looks ok on exam - Increase Entresto to 97/103 mg bid. - No SGLT2i w/ A1c 12. - Continue carvedilol 6.25 mg bid.  - Continue Lasix 40 mg daily for now. - Continue hydralazine 50 mg tid. Add Imdur 30 - Continue spironolactone 25 mg daily.  - Await genetic testing   - Will start tafamadis and will likely benefit from Olga as well - Refer to Dr. Posey Pronto in Neurology. 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 CAD - Prior MI followed by PCI in [place] in either 2019 or 2020.  - Patent RPLV stent on [facility] 10/22. Also had 20% distal RCA, 30% p LAD, 70% d LAD, 60% OM2 - No s/s angina - Continue ASA + Plavix. - Continue atorvastatin 80 mg daily.   3. HTN - Elevated. Increase Entresto to 97/103 bid - Add Imdur   4. Uncontrolled DM - Needs PCP follow up. - On Januvia. - No SGLT2i yet with A1c 12.  5. PVCs - Denies palpitations. - ECG ok today.  6. Neuropathy - This seems to be his biggest limiter. - On gabapentin. - Refer to Dr. Posey Pronto in Neurology. Consider Amvuttra  7. SDOH - He has Medicaid and disability. -  Transportation is an issue, he has a car but unable to drive (needs license). - 8 year old son helps with meds. - Engage HFSW  Total time spent 45 minutes. Over half that time spent discussing above.    Rafael Bihari, FNP  8:35 AM

## 2021-11-03 ENCOUNTER — Telehealth (HOSPITAL_COMMUNITY): Payer: Self-pay | Admitting: Licensed Clinical Social Worker

## 2021-11-03 ENCOUNTER — Telehealth: Payer: Self-pay | Admitting: Family Medicine

## 2021-11-03 ENCOUNTER — Other Ambulatory Visit (HOSPITAL_COMMUNITY): Payer: Self-pay

## 2021-11-03 NOTE — Telephone Encounter (Signed)
CSW informed by clinic staff that pt needing help setting up transportation to clinic visit next month.  Pt enrolled with [facility] so CSW called pt to discuss referral to [facility] to help him assess his transportation benefit through [employer] Medicare and set up ride for clinic visit.  CSW unable to reach pt or leave VM- will continue to attempt  Jorge Ny, [place] [facility] Desk#: [phone] Cell#: [phone]

## 2021-11-03 NOTE — Telephone Encounter (Signed)
ERROR/DNS pt called appropriate providerPt requesting refill hydrocortisone cream 1 %  [facility] [place], [place] - [address] Phone:  [phone]  Fax:  [phone]

## 2021-11-04 ENCOUNTER — Other Ambulatory Visit (HOSPITAL_COMMUNITY): Payer: Self-pay | Admitting: *Deleted

## 2021-11-04 MED ORDER — HYDROCORTISONE 1 % EX CREA: TOPICAL_CREAM | CUTANEOUS | 0 refills | Status: DC

## 2021-11-09 ENCOUNTER — Telehealth: Payer: Self-pay | Admitting: Family Medicine

## 2021-11-09 NOTE — Telephone Encounter (Signed)
Left message for patient to call back and schedule Medicare Annual Wellness Visit (AWV) either virtually or in office. Left  my Zachery Conch number [phone]    AWV-I per [place] 04/24/2019  please schedule at anytime with [facility] Nurse Health Advisor 1 or 2   This should be a 45 minute visit.

## 2021-11-10 ENCOUNTER — Other Ambulatory Visit: Payer: Self-pay

## 2021-11-10 ENCOUNTER — Emergency Department (HOSPITAL_COMMUNITY): Payer: Medicare Other

## 2021-11-10 ENCOUNTER — Encounter (HOSPITAL_COMMUNITY): Payer: Self-pay | Admitting: Emergency Medicine

## 2021-11-10 ENCOUNTER — Emergency Department (HOSPITAL_COMMUNITY)
Admission: EM | Admit: 2021-11-10 | Discharge: 2021-11-10 | Disposition: A | Discharge status: Home / Self Care | Payer: Medicare Other | Attending: Emergency Medicine | Admitting: Emergency Medicine

## 2021-11-10 ENCOUNTER — Other Ambulatory Visit (HOSPITAL_COMMUNITY): Payer: Self-pay

## 2021-11-10 DIAGNOSIS — H02845 Edema of left lower eyelid: Secondary | ICD-10-CM | POA: Diagnosis present

## 2021-11-10 DIAGNOSIS — R109 Unspecified abdominal pain: Secondary | ICD-10-CM | POA: Diagnosis not present

## 2021-11-10 DIAGNOSIS — R531 Weakness: Secondary | ICD-10-CM | POA: Diagnosis not present

## 2021-11-10 DIAGNOSIS — H00015 Hordeolum externum left lower eyelid: Secondary | ICD-10-CM | POA: Diagnosis not present

## 2021-11-10 DIAGNOSIS — R2243 Localized swelling, mass and lump, lower limb, bilateral: Secondary | ICD-10-CM | POA: Diagnosis not present

## 2021-11-10 DIAGNOSIS — Z79899 Other long term (current) drug therapy: Secondary | ICD-10-CM | POA: Diagnosis not present

## 2021-11-10 DIAGNOSIS — I509 Heart failure, unspecified: Secondary | ICD-10-CM | POA: Insufficient documentation

## 2021-11-10 DIAGNOSIS — E1142 Type 2 diabetes mellitus with diabetic polyneuropathy: Secondary | ICD-10-CM | POA: Diagnosis not present

## 2021-11-10 DIAGNOSIS — R790 Abnormal level of blood mineral: Secondary | ICD-10-CM | POA: Diagnosis not present

## 2021-11-10 DIAGNOSIS — R079 Chest pain, unspecified: Secondary | ICD-10-CM | POA: Insufficient documentation

## 2021-11-10 DIAGNOSIS — Z7982 Long term (current) use of aspirin: Secondary | ICD-10-CM | POA: Diagnosis not present

## 2021-11-10 DIAGNOSIS — I10 Essential (primary) hypertension: Secondary | ICD-10-CM | POA: Diagnosis not present

## 2021-11-10 DIAGNOSIS — I639 Cerebral infarction, unspecified: Secondary | ICD-10-CM | POA: Diagnosis not present

## 2021-11-10 DIAGNOSIS — E1342 Other specified diabetes mellitus with diabetic polyneuropathy: Secondary | ICD-10-CM

## 2021-11-10 DIAGNOSIS — H5710 Ocular pain, unspecified eye: Secondary | ICD-10-CM | POA: Diagnosis not present

## 2021-11-10 DIAGNOSIS — H538 Other visual disturbances: Secondary | ICD-10-CM | POA: Diagnosis not present

## 2021-11-10 DIAGNOSIS — R609 Edema, unspecified: Secondary | ICD-10-CM | POA: Diagnosis not present

## 2021-11-10 LAB — COMPREHENSIVE METABOLIC PANEL
ALT: 11 U/L (ref 0–44)
AST: 15 U/L (ref 15–41)
Albumin: 3.6 g/dL (ref 3.5–5.0)
Alkaline Phosphatase: 75 U/L (ref 38–126)
Anion gap: 8 (ref 5–15)
BUN: 22 mg/dL — ABNORMAL HIGH (ref 6–20)
CO2: 27 mmol/L (ref 22–32)
Calcium: 9.2 mg/dL (ref 8.9–10.3)
Chloride: 103 mmol/L (ref 98–111)
Creatinine, Ser: 1.57 mg/dL — ABNORMAL HIGH (ref 0.61–1.24)
GFR, Estimated: 51 mL/min — ABNORMAL LOW (ref >60)
Glucose, Bld: 150 mg/dL — ABNORMAL HIGH (ref 70–99)
Potassium: 4.1 mmol/L (ref 3.5–5.1)
Sodium: 138 mmol/L (ref 135–145)
Total Bilirubin: 0.8 mg/dL (ref 0.3–1.2)
Total Protein: 7.4 g/dL (ref 6.5–8.1)

## 2021-11-10 LAB — CBC WITH DIFFERENTIAL/PLATELET
Abs Immature Granulocytes: 0.02 10*3/uL (ref 0.00–0.07)
Basophils Absolute: 0.1 10*3/uL (ref 0.0–0.1)
Basophils Relative: 1 %
Eosinophils Absolute: 0.5 10*3/uL (ref 0.0–0.5)
Eosinophils Relative: 8 %
HCT: 40.8 % (ref 39.0–52.0)
Hemoglobin: 13.4 g/dL (ref 13.0–17.0)
Immature Granulocytes: 0 %
Lymphocytes Relative: 26 %
Lymphs Abs: 1.7 10*3/uL (ref 0.7–4.0)
MCH: 28.1 pg (ref 26.0–34.0)
MCHC: 32.8 g/dL (ref 30.0–36.0)
MCV: 85.5 fL (ref 80.0–100.0)
Monocytes Absolute: 0.8 10*3/uL (ref 0.1–1.0)
Monocytes Relative: 12 %
Neutro Abs: 3.6 10*3/uL (ref 1.7–7.7)
Neutrophils Relative %: 53 %
Platelets: 351 10*3/uL (ref 150–400)
RBC: 4.77 MIL/uL (ref 4.22–5.81)
RDW: 15.5 % (ref 11.5–15.5)
WBC: 6.6 10*3/uL (ref 4.0–10.5)
nRBC: 0 % (ref 0.0–0.2)

## 2021-11-10 LAB — TROPONIN I (HIGH SENSITIVITY)
Troponin I (High Sensitivity): 16 ng/L (ref <18)
Troponin I (High Sensitivity): 19 ng/L — ABNORMAL HIGH (ref <18)

## 2021-11-10 LAB — URINALYSIS, ROUTINE W REFLEX MICROSCOPIC
Bacteria, UA: NONE SEEN
Bilirubin Urine: NEGATIVE
Glucose, UA: NEGATIVE mg/dL
Hgb urine dipstick: NEGATIVE
Ketones, ur: NEGATIVE mg/dL
Leukocytes,Ua: NEGATIVE
Nitrite: NEGATIVE
Protein, ur: 100 mg/dL — AB
Specific Gravity, Urine: 1.012 (ref 1.005–1.030)
pH: 5 (ref 5.0–8.0)

## 2021-11-10 LAB — BRAIN NATRIURETIC PEPTIDE: B Natriuretic Peptide: 319.8 pg/mL — ABNORMAL HIGH (ref 0.0–100.0)

## 2021-11-10 MED ORDER — OXYCODONE-ACETAMINOPHEN 5-325 MG PO TABS
1.0000 | ORAL_TABLET | Freq: Once | ORAL | Status: AC
  Administered 2021-11-10: 1 via ORAL
  Filled 2021-11-10: qty 1

## 2021-11-10 MED ORDER — AMOXICILLIN-POT CLAVULANATE 875-125 MG PO TABS
1.0000 | ORAL_TABLET | Freq: Once | ORAL | Status: AC
  Administered 2021-11-10: 1 via ORAL
  Filled 2021-11-10: qty 1

## 2021-11-10 MED ORDER — AMOXICILLIN-POT CLAVULANATE 875-125 MG PO TABS: 1.0000 | ORAL_TABLET | Freq: Two times a day (BID) | ORAL | 0 refills | Status: DC

## 2021-11-10 MED ORDER — SODIUM CHLORIDE 0.9 % IV BOLUS: 500.0000 mL | Freq: Once | INTRAVENOUS | Status: DC

## 2021-11-10 MED ORDER — HYDROCORTISONE 1 % EX CREA
TOPICAL_CREAM | Freq: Once | CUTANEOUS | Status: DC
  Filled 2021-11-10: qty 28

## 2021-11-10 MED ORDER — ERYTHROMYCIN 5 MG/GM OP OINT
1.0000 | TOPICAL_OINTMENT | Freq: Once | OPHTHALMIC | Status: AC
  Administered 2021-11-10: 1 via OPHTHALMIC
  Filled 2021-11-10: qty 3.5

## 2021-11-10 NOTE — ED Provider Notes (Signed)
[facility] EMERGENCY DEPARTMENT Provider Note   CSN: [id number] Arrival date & time: 11/10/21  1104     History  Chief Complaint  Patient presents with   Multiple Complaints    Thomas West is a 61 y.o. male history of heart failure, diabetic neuropathy, diabetes, here presenting with multiple complaints.  Patient initially called EMS because of swelling under the left eyelid.  When he was in triage, patient then complained of chest pain for the last 3 to 4 days.  Patient also states that he has bilateral neuropathy and saw cardiology a week ago but did not hear back regarding referral to a neurologist.  Patient also states that he is concerned for pain in the right flank area that is been going on for weeks as well.  Finally, patient is not happy with his primary care doctor and wants to find another one.   The history is provided by the patient.       Home Medications Prior to Admission medications   Medication Sig Start Date End Date Taking? Authorizing Provider  aspirin 81 MG EC tablet Take 1 tablet (81 mg total) by mouth daily. 07/15/21 07/10/22  Darlin Drop, DO  atorvastatin (LIPITOR) 80 MG tablet Take 1 tablet (80 mg total) by mouth daily. 07/15/21 10/13/21  Darlin Drop, DO  carvedilol (COREG) 6.25 MG tablet Take 1 tablet (6.25 mg total) by mouth EVERY 12 HOURS (10AM &10PM) 07/15/21 10/13/21  Darlin Drop, DO  furosemide (LASIX) 40 MG tablet Take 1 tablet (40 mg total) by mouth daily. 07/15/21 10/13/21  Darlin Drop, DO  gabapentin (NEURONTIN) 400 MG capsule Take 1 capsule (400 mg total) by mouth 2 (two) times daily. 08/18/21   Prosperi, Christian H, PA-C  hydrALAZINE (APRESOLINE) 50 MG tablet Take 1 tablet (50 mg total) by mouth 3 (three) times daily. 07/15/21 10/13/21  Darlin Drop, DO  hydrocortisone cream 1 % Apply to affected area 2 times daily 11/04/21   Bensimhon, Bevelyn Buckles, MD  hydrOXYzine (ATARAX) 25 MG tablet Take 1 tablet (25 mg total) by mouth  every 6 (six) hours. 09/20/21   Jari Sportsman, MD  isosorbide mononitrate (IMDUR) 30 MG 24 hr tablet Take 1 tablet (30 mg total) by mouth daily. 09/02/21   Bensimhon, Bevelyn Buckles, MD  methocarbamol (ROBAXIN) 500 MG tablet Take 1 tablet (500 mg total) by mouth 2 (two) times daily. 08/18/21   Prosperi, Christian H, PA-C  potassium chloride (KLOR-CON M) 10 MEQ tablet Take 1 tablet (10 mEq total) by mouth daily. 07/15/21 10/13/21  Darlin Drop, DO  predniSONE (DELTASONE) 20 MG tablet Take 2 tablets (40 mg total) by mouth daily. 08/18/21   Prosperi, Christian H, PA-C  sacubitril-valsartan (ENTRESTO) 97-103 MG Take 1 tablet by mouth 2 (two) times daily. 09/02/21   Bensimhon, Bevelyn Buckles, MD  sitaGLIPtin (JANUVIA) 25 MG tablet Take 1 tablet (25 mg total) by mouth daily. 07/15/21 10/13/21  Darlin Drop, DO  spironolactone (ALDACTONE) 25 MG tablet Take 1 tablet (25 mg total) by mouth daily. 07/15/21 10/13/21  Darlin Drop, DO  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 Take 1 capsule by mouth daily. 09/02/21   Bensimhon, Bevelyn Buckles, MD      Allergies    Bee venom    Review of Systems   Review of Systems  Cardiovascular:  Positive for chest pain.  Musculoskeletal:        Right flank pain, leg pain  All other systems reviewed  and are negative.   Physical Exam Updated Vital Signs BP (!) 158/79   Pulse 84   Temp 98.2 F (36.8 C) (Oral)   Resp 15   SpO2 95%  Physical Exam Vitals and nursing note reviewed.  Constitutional:      Comments: Chronically ill but not acutely ill  HENT:     Head: Normocephalic.     Nose: Nose normal.     Mouth/Throat:     Mouth: Mucous membranes are moist.  Eyes:     Comments: Patient appears to have a stye in the left lower eyelid.  No obvious conjunctivitis.  Patient has normal extraocular movements.  Cardiovascular:     Rate and Rhythm: Normal rate and regular rhythm.     Pulses: Normal pulses.     Heart sounds: Normal heart sounds.  Pulmonary:     Effort: Pulmonary effort is normal.      Breath sounds: Normal breath sounds.  Abdominal:     General: Abdomen is flat.     Palpations: Abdomen is soft.  Musculoskeletal:        General: Normal range of motion.     Cervical back: Normal range of motion and neck supple.     Comments: 1+ edema bilateral legs.  Both legs are cold and has diminished pulses bilateral DP (chronic per patient).  Mild right paralumbar tenderness  Neurological:     General: No focal deficit present.     Mental Status: He is alert and oriented to person, place, and time.  Psychiatric:        Mood and Affect: Mood normal.        Behavior: Behavior normal.     ED Results / Procedures / Treatments   Labs (all labs ordered are listed, but only abnormal results are displayed) Labs Reviewed  COMPREHENSIVE METABOLIC PANEL - Abnormal; Notable for the following components:      Result Value   Glucose, Bld 150 (*)    BUN 22 (*)    Creatinine, Ser 1.57 (*)    GFR, Estimated 51 (*)    All other components within normal limits  BRAIN NATRIURETIC PEPTIDE - Abnormal; Notable for the following components:   B Natriuretic Peptide 319.8 (*)    All other components within normal limits  URINALYSIS, ROUTINE W REFLEX MICROSCOPIC - Abnormal; Notable for the following components:   Protein, ur 100 (*)    All other components within normal limits  TROPONIN I (HIGH SENSITIVITY) - Abnormal; Notable for the following components:   Troponin I (High Sensitivity) 19 (*)    All other components within normal limits  CBC WITH DIFFERENTIAL/PLATELET  TROPONIN I (HIGH SENSITIVITY)    EKG EKG Interpretation  Date/Time:  Tuesday November 10 2021 11:27:38 EDT Ventricular Rate:  82 PR Interval:  168 QRS Duration: 80 QT Interval:  368 QTC Calculation: 429 R Axis:   77 Text Interpretation: Normal sinus rhythm Minimal voltage criteria for LVH, may be normal variant ( Sokolow-Lyon ) ST & T wave abnormality, consider lateral ischemia Abnormal ECG When compared with ECG of  05-Oct-2021 05:15, No significant change since last tracing Confirmed by Richardean Canal [phone]) on 11/10/2021 5:06:42 PM  Radiology DG Chest 2 View  Result Date: 11/10/2021 CLINICAL DATA:  Lambert Mody central chest pain, shortness of breath and weakness for 4 days, history CHF, diabetes mellitus, hypertension, stroke EXAM: CHEST - 2 VIEW COMPARISON:  10/05/2021 FINDINGS: Normal heart size and mediastinal contours. Enlarged central pulmonary arteries question pulmonary  arterial hypertension. Atherosclerotic calcification aorta. Lungs clear. No pulmonary infiltrate, pleural effusion, or pneumothorax. Bones unremarkable. IMPRESSION: Question pulmonary arterial hypertension. No acute abnormalities. Aortic Atherosclerosis (ICD10-I70.0). Electronically Signed   By: Ulyses Southward M.D.   On: 11/10/2021 18:00    Procedures Procedures    Medications Ordered in ED Medications  hydrocortisone cream 1 % (has no administration in time range)  sodium chloride 0.9 % bolus 500 mL (has no administration in time range)  amoxicillin-clavulanate (AUGMENTIN) 875-125 MG per tablet 1 tablet (1 tablet Oral Given 11/10/21 1902)  erythromycin ophthalmic ointment 1 Application (1 Application Left Eye Given 11/10/21 1902)  oxyCODONE-acetaminophen (PERCOCET/ROXICET) 5-325 MG per tablet 1 tablet (1 tablet Oral Given 11/10/21 1902)    ED Course/ Medical Decision Making/ A&P                           Medical Decision Making Thomas West is a 61 y.o. male here presenting with left lower eyelid swelling and pain.  Clinically patient has a stye.  I am not concerned for periorbital or orbital cellulitis.  I plan to give patient Augmentin and erythromycin ointment.  However while in the ER, he wants his chronic issues addressed.  Patient has history of heart failure has been having chest pressure for several days.  Patient also has some leg swelling as well.  Plan to get CBC and BMP and troponin x2 and BNP.  Patient is also very frustrated  about his diabetic neuropathy and would like another referral to neurology.  Finally he has some right flank pain but is more right paralumbar.  Patient walks with a walker at baseline and has no saddle anesthesia and has normal reflexes bilateral knees.  He is concern for possible kidney stone but symptoms been going on for several days so we will get a UA to look for pyelonephritis or blood in his urine.  If there is no blood in his urine I will hold off on imaging.   8:19 PM I reviewed patient's labs and independently reviewed his imaging study.  Patient's initial troponin was 16 and repeat was 19.  Patient's BNP is 320 which is improved from before.  His creatinine went up to 1.6.  Chest x-ray is clear.  Patient was given Augmentin and erythromycin.  I put another referral for neurology regarding his neuropathy and I also will refer him to ophthalmology for his sty.  Stable for discharge and gave strict return precautions.   Problems Addressed: Chronic heart failure, unspecified heart failure type [facility]): chronic illness or injury Diabetic polyneuropathy associated with other specified diabetes mellitus (HCC): chronic illness or injury Hordeolum externum of left lower eyelid: acute illness or injury  Amount and/or Complexity of Data Reviewed Labs: ordered. Decision-making details documented in ED Course. Radiology: ordered and independent interpretation performed. Decision-making details documented in ED Course. ECG/medicine tests: ordered and independent interpretation performed. Decision-making details documented in ED Course.  Risk Prescription drug management.    Final Clinical Impression(s) / ED Diagnoses Final diagnoses:  None    Rx / DC Orders ED Discharge Orders     None         Charlynne Pander, MD 11/10/21 2023

## 2021-11-10 NOTE — ED Triage Notes (Addendum)
Pt arrives via EMS for left eye swelling and blurry vision. Pt states the swelling started slightly last night. Pt's pupil is reactive per EMS. Denies any injury to the eye.

## 2021-11-10 NOTE — ED Provider Triage Note (Signed)
Emergency Medicine Provider Triage Evaluation Note  Brock Larmon , a 61 y.o. male  was evaluated in triage.  Pt complains of left eye pain x yesterday. Swelling to the left lower eyelid, no MAT. Reports vision is blurry also endorsing some chest pain along with back pain.  However is very vague about the symptoms.  Review of Systems  Positive: Eye pain, skin changes Negative: Weakness, headache, sob  Physical Exam  BP (!) 181/112 (BP Location: Right Arm)   Pulse 87   Temp 98.2 F (36.8 C) (Oral)   Resp 16   SpO2 100%  Gen:   Awake, no distress   Resp:  Normal effort  MSK:   Moves extremities without difficulty  Other:  Normal swelling noted to the left lower eyelid.  EOMs are within normal limits.  Endorsing vision as blurry.  Upper and lower extremities with good strength, does have prior history of surgery to the right shoulder.  Medical Decision Making  Medically screening exam initiated at 11:16 AM.  Appropriate orders placed.  Marcquis Ridlon was informed that the remainder of the evaluation will be completed by another provider, this initial triage assessment does not replace that evaluation, and the importance of remaining in the ED until their evaluation is complete.     Claude Manges, PA-C 11/10/21 1121

## 2021-11-10 NOTE — ED Notes (Signed)
Patient transported to X-ray 

## 2021-11-10 NOTE — Discharge Instructions (Addendum)
You have a stye in the left lower eyelid.  Please apply cold compresses and take Augmentin twice daily  I also gave you erythromycin ointment so please use it twice a day for a week.  I gave you Dr. Darcel Bayley (ophthalmology) office number and you need to follow-up with them  You also have diabetic neuropathy and I put a referral in for [facility] neurology.  If they do not contact you by the end of the week, please call their office  Please also follow-up with your primary care doctor and your cardiologist  Return to ER if you have worse chest pain, leg swelling, purulent drainage from the eye, blurry vision, double vision, fever

## 2021-11-13 ENCOUNTER — Other Ambulatory Visit (HOSPITAL_COMMUNITY): Payer: Self-pay | Admitting: Internal Medicine

## 2021-11-16 ENCOUNTER — Telehealth (HOSPITAL_COMMUNITY): Payer: Self-pay | Admitting: Licensed Clinical Social Worker

## 2021-11-26 ENCOUNTER — Other Ambulatory Visit: Payer: Self-pay | Admitting: Family Medicine

## 2021-11-26 DIAGNOSIS — I10 Essential (primary) hypertension: Secondary | ICD-10-CM

## 2021-11-26 DIAGNOSIS — I2510 Atherosclerotic heart disease of native coronary artery without angina pectoris: Secondary | ICD-10-CM

## 2021-11-26 DIAGNOSIS — I5020 Unspecified systolic (congestive) heart failure: Secondary | ICD-10-CM

## 2021-11-28 ENCOUNTER — Other Ambulatory Visit (HOSPITAL_COMMUNITY): Payer: Self-pay

## 2021-12-04 ENCOUNTER — Telehealth (HOSPITAL_COMMUNITY): Payer: Self-pay

## 2021-12-04 NOTE — Telephone Encounter (Signed)
Called to confirm/remind patient of their appointment at the [facility] on 12/07/21.   Patient reminded to bring all medications and/or complete list.  Confirmed patient has transportation. Gave directions, instructed to utilize valet parking.  Confirmed appointment prior to ending call.   

## 2021-12-07 ENCOUNTER — Inpatient Hospital Stay (HOSPITAL_COMMUNITY)
Admission: EM | Admit: 2021-12-07 | Discharge: 2021-12-10 | DRG: 291 | Disposition: A | Discharge status: Home Health | Payer: Medicare Other | Attending: Internal Medicine | Admitting: Internal Medicine

## 2021-12-07 ENCOUNTER — Telehealth: Payer: Self-pay

## 2021-12-07 ENCOUNTER — Ambulatory Visit (HOSPITAL_BASED_OUTPATIENT_CLINIC_OR_DEPARTMENT_OTHER)
Admission: RE | Admit: 2021-12-07 | Discharge: 2021-12-07 | Disposition: A | Discharge status: Home / Self Care | Payer: Medicare Other | Source: Ambulatory Visit | Attending: Family Medicine | Admitting: Family Medicine

## 2021-12-07 ENCOUNTER — Telehealth (HOSPITAL_COMMUNITY): Payer: Self-pay | Admitting: Licensed Clinical Social Worker

## 2021-12-07 ENCOUNTER — Other Ambulatory Visit (HOSPITAL_COMMUNITY): Payer: Self-pay

## 2021-12-07 ENCOUNTER — Encounter (HOSPITAL_COMMUNITY): Payer: Self-pay

## 2021-12-07 ENCOUNTER — Other Ambulatory Visit: Payer: Self-pay

## 2021-12-07 ENCOUNTER — Emergency Department (HOSPITAL_COMMUNITY): Payer: Medicare Other

## 2021-12-07 VITALS — BP 148/100 | HR 105 | Wt 159.2 lb

## 2021-12-07 DIAGNOSIS — Z7902 Long term (current) use of antithrombotics/antiplatelets: Secondary | ICD-10-CM | POA: Insufficient documentation

## 2021-12-07 DIAGNOSIS — E1142 Type 2 diabetes mellitus with diabetic polyneuropathy: Secondary | ICD-10-CM | POA: Diagnosis not present

## 2021-12-07 DIAGNOSIS — E1122 Type 2 diabetes mellitus with diabetic chronic kidney disease: Secondary | ICD-10-CM | POA: Insufficient documentation

## 2021-12-07 DIAGNOSIS — G629 Polyneuropathy, unspecified: Secondary | ICD-10-CM | POA: Diagnosis not present

## 2021-12-07 DIAGNOSIS — Z87891 Personal history of nicotine dependence: Secondary | ICD-10-CM

## 2021-12-07 DIAGNOSIS — I252 Old myocardial infarction: Secondary | ICD-10-CM

## 2021-12-07 DIAGNOSIS — E1165 Type 2 diabetes mellitus with hyperglycemia: Secondary | ICD-10-CM | POA: Diagnosis present

## 2021-12-07 DIAGNOSIS — R0789 Other chest pain: Secondary | ICD-10-CM | POA: Insufficient documentation

## 2021-12-07 DIAGNOSIS — I5022 Chronic systolic (congestive) heart failure: Secondary | ICD-10-CM

## 2021-12-07 DIAGNOSIS — I5023 Acute on chronic systolic (congestive) heart failure: Secondary | ICD-10-CM

## 2021-12-07 DIAGNOSIS — R000 Tachycardia, unspecified: Secondary | ICD-10-CM | POA: Diagnosis not present

## 2021-12-07 DIAGNOSIS — J3489 Other specified disorders of nose and nasal sinuses: Secondary | ICD-10-CM | POA: Diagnosis not present

## 2021-12-07 DIAGNOSIS — Z91148 Patient's other noncompliance with medication regimen for other reason: Secondary | ICD-10-CM

## 2021-12-07 DIAGNOSIS — Z043 Encounter for examination and observation following other accident: Secondary | ICD-10-CM | POA: Diagnosis not present

## 2021-12-07 DIAGNOSIS — N179 Acute kidney failure, unspecified: Secondary | ICD-10-CM | POA: Diagnosis present

## 2021-12-07 DIAGNOSIS — E11610 Type 2 diabetes mellitus with diabetic neuropathic arthropathy: Secondary | ICD-10-CM

## 2021-12-07 DIAGNOSIS — M549 Dorsalgia, unspecified: Secondary | ICD-10-CM | POA: Diagnosis not present

## 2021-12-07 DIAGNOSIS — I2510 Atherosclerotic heart disease of native coronary artery without angina pectoris: Secondary | ICD-10-CM | POA: Insufficient documentation

## 2021-12-07 DIAGNOSIS — I130 Hypertensive heart and chronic kidney disease with heart failure and stage 1 through stage 4 chronic kidney disease, or unspecified chronic kidney disease: Secondary | ICD-10-CM | POA: Insufficient documentation

## 2021-12-07 DIAGNOSIS — N189 Chronic kidney disease, unspecified: Secondary | ICD-10-CM | POA: Diagnosis present

## 2021-12-07 DIAGNOSIS — E1140 Type 2 diabetes mellitus with diabetic neuropathy, unspecified: Secondary | ICD-10-CM | POA: Insufficient documentation

## 2021-12-07 DIAGNOSIS — Z8673 Personal history of transient ischemic attack (TIA), and cerebral infarction without residual deficits: Secondary | ICD-10-CM | POA: Insufficient documentation

## 2021-12-07 DIAGNOSIS — Z91199 Patient's noncompliance with other medical treatment and regimen due to unspecified reason: Secondary | ICD-10-CM | POA: Diagnosis not present

## 2021-12-07 DIAGNOSIS — M545 Low back pain, unspecified: Secondary | ICD-10-CM

## 2021-12-07 DIAGNOSIS — I43 Cardiomyopathy in diseases classified elsewhere: Secondary | ICD-10-CM | POA: Diagnosis not present

## 2021-12-07 DIAGNOSIS — I5020 Unspecified systolic (congestive) heart failure: Secondary | ICD-10-CM

## 2021-12-07 DIAGNOSIS — W109XXA Fall (on) (from) unspecified stairs and steps, initial encounter: Secondary | ICD-10-CM | POA: Diagnosis present

## 2021-12-07 DIAGNOSIS — Z79899 Other long term (current) drug therapy: Secondary | ICD-10-CM

## 2021-12-07 DIAGNOSIS — I493 Ventricular premature depolarization: Secondary | ICD-10-CM

## 2021-12-07 DIAGNOSIS — E785 Hyperlipidemia, unspecified: Secondary | ICD-10-CM | POA: Diagnosis not present

## 2021-12-07 DIAGNOSIS — Z955 Presence of coronary angioplasty implant and graft: Secondary | ICD-10-CM

## 2021-12-07 DIAGNOSIS — E854 Organ-limited amyloidosis: Secondary | ICD-10-CM | POA: Diagnosis not present

## 2021-12-07 DIAGNOSIS — S0990XA Unspecified injury of head, initial encounter: Secondary | ICD-10-CM | POA: Diagnosis not present

## 2021-12-07 DIAGNOSIS — Z139 Encounter for screening, unspecified: Secondary | ICD-10-CM

## 2021-12-07 DIAGNOSIS — I509 Heart failure, unspecified: Secondary | ICD-10-CM | POA: Diagnosis present

## 2021-12-07 DIAGNOSIS — I10 Essential (primary) hypertension: Secondary | ICD-10-CM

## 2021-12-07 DIAGNOSIS — R079 Chest pain, unspecified: Secondary | ICD-10-CM

## 2021-12-07 DIAGNOSIS — T501X6A Underdosing of loop [high-ceiling] diuretics, initial encounter: Secondary | ICD-10-CM | POA: Diagnosis not present

## 2021-12-07 DIAGNOSIS — Z7982 Long term (current) use of aspirin: Secondary | ICD-10-CM | POA: Diagnosis not present

## 2021-12-07 DIAGNOSIS — W19XXXA Unspecified fall, initial encounter: Principal | ICD-10-CM

## 2021-12-07 DIAGNOSIS — I472 Ventricular tachycardia, unspecified: Secondary | ICD-10-CM | POA: Diagnosis present

## 2021-12-07 LAB — BASIC METABOLIC PANEL
Anion gap: 11 (ref 5–15)
BUN: 13 mg/dL (ref 6–20)
CO2: 24 mmol/L (ref 22–32)
Calcium: 8.7 mg/dL — ABNORMAL LOW (ref 8.9–10.3)
Chloride: 105 mmol/L (ref 98–111)
Creatinine, Ser: 1.26 mg/dL — ABNORMAL HIGH (ref 0.61–1.24)
GFR, Estimated: >60 mL/min (ref >60)
Glucose, Bld: 168 mg/dL — ABNORMAL HIGH (ref 70–99)
Potassium: 4 mmol/L (ref 3.5–5.1)
Sodium: 140 mmol/L (ref 135–145)

## 2021-12-07 LAB — CBC WITH DIFFERENTIAL/PLATELET
Abs Immature Granulocytes: 0.02 10*3/uL (ref 0.00–0.07)
Basophils Absolute: 0.1 10*3/uL (ref 0.0–0.1)
Basophils Relative: 1 %
Eosinophils Absolute: 0.4 10*3/uL (ref 0.0–0.5)
Eosinophils Relative: 5 %
HCT: 39 % (ref 39.0–52.0)
Hemoglobin: 12.6 g/dL — ABNORMAL LOW (ref 13.0–17.0)
Immature Granulocytes: 0 %
Lymphocytes Relative: 27 %
Lymphs Abs: 2 10*3/uL (ref 0.7–4.0)
MCH: 28.5 pg (ref 26.0–34.0)
MCHC: 32.3 g/dL (ref 30.0–36.0)
MCV: 88.2 fL (ref 80.0–100.0)
Monocytes Absolute: 0.7 10*3/uL (ref 0.1–1.0)
Monocytes Relative: 9 %
Neutro Abs: 4.3 10*3/uL (ref 1.7–7.7)
Neutrophils Relative %: 58 %
Platelets: 431 10*3/uL — ABNORMAL HIGH (ref 150–400)
RBC: 4.42 MIL/uL (ref 4.22–5.81)
RDW: 15.5 % (ref 11.5–15.5)
WBC: 7.4 10*3/uL (ref 4.0–10.5)
nRBC: 0 % (ref 0.0–0.2)

## 2021-12-07 LAB — COMPREHENSIVE METABOLIC PANEL
ALT: 34 U/L (ref 0–44)
AST: 33 U/L (ref 15–41)
Albumin: 3.4 g/dL — ABNORMAL LOW (ref 3.5–5.0)
Alkaline Phosphatase: 143 U/L — ABNORMAL HIGH (ref 38–126)
Anion gap: 9 (ref 5–15)
BUN: 14 mg/dL (ref 6–20)
CO2: 24 mmol/L (ref 22–32)
Calcium: 8.5 mg/dL — ABNORMAL LOW (ref 8.9–10.3)
Chloride: 106 mmol/L (ref 98–111)
Creatinine, Ser: 1.4 mg/dL — ABNORMAL HIGH (ref 0.61–1.24)
GFR, Estimated: 58 mL/min — ABNORMAL LOW (ref >60)
Glucose, Bld: 135 mg/dL — ABNORMAL HIGH (ref 70–99)
Potassium: 4.1 mmol/L (ref 3.5–5.1)
Sodium: 139 mmol/L (ref 135–145)
Total Bilirubin: 0.9 mg/dL (ref 0.3–1.2)
Total Protein: 6.9 g/dL (ref 6.5–8.1)

## 2021-12-07 LAB — MAGNESIUM: Magnesium: 1.6 mg/dL — ABNORMAL LOW (ref 1.7–2.4)

## 2021-12-07 LAB — LIPASE, BLOOD: Lipase: 26 U/L (ref 11–51)

## 2021-12-07 LAB — TROPONIN I (HIGH SENSITIVITY)
Troponin I (High Sensitivity): 20 ng/L — ABNORMAL HIGH (ref <18)
Troponin I (High Sensitivity): 42 ng/L — ABNORMAL HIGH (ref <18)

## 2021-12-07 LAB — BRAIN NATRIURETIC PEPTIDE: B Natriuretic Peptide: 3127.4 pg/mL — ABNORMAL HIGH (ref 0.0–100.0)

## 2021-12-07 MED ORDER — CARVEDILOL 6.25 MG PO TABS
6.2500 mg | ORAL_TABLET | Freq: Two times a day (BID) | ORAL | Status: DC
  Administered 2021-12-07 – 2021-12-10 (×6): 6.25 mg via ORAL
  Filled 2021-12-07: qty 1
  Filled 2021-12-07: qty 2
  Filled 2021-12-07: qty 1
  Filled 2021-12-07: qty 2
  Filled 2021-12-07 (×2): qty 1

## 2021-12-07 MED ORDER — OXYCODONE-ACETAMINOPHEN 5-325 MG PO TABS
1.0000 | ORAL_TABLET | Freq: Once | ORAL | Status: AC
  Administered 2021-12-07: 1 via ORAL
  Filled 2021-12-07: qty 1

## 2021-12-07 MED ORDER — FUROSEMIDE 10 MG/ML IJ SOLN
40.0000 mg | Freq: Once | INTRAMUSCULAR | Status: AC
  Administered 2021-12-07: 40 mg via INTRAVENOUS
  Filled 2021-12-07: qty 4

## 2021-12-07 MED ORDER — NITROGLYCERIN 2 % TD OINT
1.0000 [in_us] | TOPICAL_OINTMENT | Freq: Once | TRANSDERMAL | Status: AC
  Administered 2021-12-07: 1 [in_us] via TOPICAL
  Filled 2021-12-07: qty 1

## 2021-12-07 MED ORDER — HYDRALAZINE HCL 50 MG PO TABS
50.0000 mg | ORAL_TABLET | Freq: Three times a day (TID) | ORAL | Status: DC
  Administered 2021-12-07 – 2021-12-10 (×8): 50 mg via ORAL
  Filled 2021-12-07 (×8): qty 1
  Filled 2021-12-07: qty 2

## 2021-12-07 MED ORDER — SPIRONOLACTONE 25 MG PO TABS
25.0000 mg | ORAL_TABLET | Freq: Every day | ORAL | Status: DC
  Administered 2021-12-07 – 2021-12-09 (×3): 25 mg via ORAL
  Filled 2021-12-07 (×3): qty 1

## 2021-12-07 MED ORDER — MAGNESIUM SULFATE 2 GM/50ML IV SOLN
2.0000 g | Freq: Once | INTRAVENOUS | Status: AC
Start: 2021-12-07 — End: 2021-12-07
  Administered 2021-12-07: 2 g via INTRAVENOUS
  Filled 2021-12-07: qty 50

## 2021-12-07 MED ORDER — FUROSEMIDE 40 MG PO TABS: 40.0000 mg | ORAL_TABLET | Freq: Every day | ORAL | 1 refills | Status: DC

## 2021-12-07 MED ORDER — LIDOCAINE 5 % EX PTCH
1.0000 | MEDICATED_PATCH | CUTANEOUS | Status: DC
  Administered 2021-12-07 – 2021-12-09 (×3): 1 via TRANSDERMAL
  Filled 2021-12-07 (×3): qty 1

## 2021-12-07 MED ORDER — METHOCARBAMOL 500 MG PO TABS
500.0000 mg | ORAL_TABLET | Freq: Once | ORAL | Status: AC
  Administered 2021-12-07: 500 mg via ORAL
  Filled 2021-12-07: qty 1

## 2021-12-07 NOTE — Patient Instructions (Signed)
Thank you for coming in today  Labs were done today, if any labs are abnormal the clinic will call you No news is good news   RESTART Lasix 40 mg daily   Case workers are working to start para-medicine  Your physician recommends that you schedule a follow-up appointment in:  2-3 weeks in clinic 3-4 months with Dr. Gala Romney    Do the following things EVERYDAY: Weigh yourself in the morning before breakfast. Write it down and keep it in a log. Take your medicines as prescribed Eat low salt foods--Limit salt (sodium) to 2000 mg per day.  Stay as active as you can everyday Limit all fluids for the day to less than 2 liters  At the [facility], you and your health needs are our priority. As part of our continuing mission to provide you with exceptional heart care, we have created designated Provider Care Teams. These Care Teams include your primary Cardiologist (physician) and Advanced Practice Providers (APPs- Physician Assistants and Nurse Practitioners) who all work together to provide you with the care you need, when you need it.   You may see any of the following providers on your designated Care Team at your next follow up: Dr Arvilla Meres Dr Carron Curie, NP Robbie Lis, [place] [facility] [place], [place] Karle Plumber, PharmD   Please be sure to bring in all your medications bottles to every appointment.   If you have any questions or concerns before your next appointment please send [place] a message through [place] or call our office at [phone].    TO LEAVE A MESSAGE FOR THE NURSE SELECT OPTION 2, PLEASE LEAVE A MESSAGE INCLUDING: YOUR NAME DATE OF BIRTH CALL BACK NUMBER REASON FOR CALL**this is important as we prioritize the call backs  YOU WILL RECEIVE A CALL BACK THE SAME DAY AS LONG AS YOU CALL BEFORE 4:00 PM

## 2021-12-07 NOTE — Telephone Encounter (Signed)
Order placed to [facility] to assist with pt accessing transportation benefits through insurance.  Burna Sis, LCSW Clinical Social Worker [facility] Desk#: [phone] Cell#: [phone]

## 2021-12-07 NOTE — ED Triage Notes (Signed)
Patient arrives to ED via EMS from home s/p fall down about 7 stairs, patient complains of low back pain. Patient also reports he went to see his cardiologist this AM and was told he had a lot of fluid on him and he needed to come the hospital ASAP, however patient states he went home. Patient is c/o chest pain, states he has been out of all his medication for about 1 month and hasn't taken his plavix or BP medication. Patient is A&O x 4. EKG obtained and given to Dr. Durwin Nora.

## 2021-12-07 NOTE — Progress Notes (Addendum)
ADV[facility]ED [facility] CLIN[facility]  NOTE   Primary Care: [name], [name], MD HF Cardiologist: Dr. Haroldine Laws  HPI: Mr. Knechtel is a 61 y.o. male with history of CAD with prior MI and stent in 01/2019 at [facility] in [place] (report not available), cardiomyopathy/chronic systolic HF, uncontrolled DM II, HTN, hx CVA on chart review, CKD.    Moved to [place] from [place] 2 years ago.   Had not medical f/u until he was admitted in 31/49 with a/c systolic HF. Had been out of cardiac medications. Diuresed with IV lasix then transitioned to po lasix 40 mg daily. Started on GDMT with carvedilol, hydralazine, imdur and empagliflozin.    Echo 10/22 with EF 20-25%, RV okay, trivial MR   [facility] 10/22 with nonobstructive CAD and patent RPLV stent. RA mean 5 mmHg, PCWP mean 5 mmHg, Fick CO 7.46/CI 3.85.   He did not show up for [facility] appointment after discharge.   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  Admitted 12/22 with a/c CHF due to noncompliance with GDMT. cMRI strongly suggestive of amyloidosis, GDMT titrated. Admitted 2/23 with a/c CHF after running out of meds x 1 week. Given IV lasix, GDMT restarted. Imdur held with low BP. Seen in ED 3/23 with back pain, felt to be related to MSK. Seen in ED 08/18/21 with SOB, cardiac work up unrevealing.  Follow up 4/23, NYHA II, volume OK. Tafamadis started, Entresto increased for BP control.   Today he returns for HF follow up. Overall feeling fair. Woke up with chest tightness this AM and more SOB with walking up steps. Occasional dizziness. Noticed legs are swelling. Ran out of Lasix, does not remember how long he has been without.  Denies palpitations, dizziness, or PND/Orthopnea. Appetite ok. No fever or chills. Weight at home 150-160 pounds. No longer smokes. Lives with 26 year old son who helps with meds.   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   - cMRI (11/22): LVEF 24% RVEF 21% LGE and ECW suggestive of cardiac amyloidosis  ROS: All systems reviewed and negative except as per HPI.   Past Medical History:  Diagnosis Date   CHF (congestive heart failure) (HCC)    Coronary artery disease    Diabetes mellitus without complication (HCC)    History of MI (myocardial infarction) 03/05/2019   Hypertension    Stroke [facility])    Current Outpatient Medications  Medication Sig Dispense Refill   amoxicillin-clavulanate (AUGMENTIN) 875-125 MG tablet Take 1 tablet by mouth every 12 (twelve) hours. 14 tablet 0   aspirin 81 MG EC tablet Take 1 tablet (81 mg total) by mouth daily. 360 tablet 0   atorvastatin (LIPITOR) 80 MG tablet Take 1 tablet (80 mg total) by mouth daily. 90 tablet 0   carvedilol (COREG) 6.25 MG tablet Take 1 tablet (6.25 mg total) by mouth EVERY 12 HOURS (10AM &10PM) 180 tablet 0   furosemide (LASIX) 40 MG tablet Take 1 tablet (40 mg total) by mouth daily. 90 tablet 0   hydrALAZINE (APRESOLINE) 50 MG tablet Take 1 tablet (50 mg total) by mouth 3 (three) times daily. 270 tablet 0   hydrOXYzine (ATARAX) 25 MG tablet Take 1 tablet (25 mg total) by mouth every 6 (six) hours. 30 tablet 0   isosorbide mononitrate (IMDUR) 30 MG 24  hr tablet Take 1 tablet (30 mg total) by mouth daily. 90 tablet 3   methocarbamol (ROBAXIN) 500 MG tablet Take 1 tablet (500 mg total) by mouth 2 (two) times daily. 20 tablet 0   potassium chloride (KLOR-CON M) 10 MEQ tablet Take 1 tablet (10 mEq total) by mouth daily. 90 tablet 0   sacubitril-valsartan (ENTRESTO) 97-103 MG Take 1 tablet by mouth 2 (two) times daily. 60 tablet 11   sitaGLIPtin (JANUVIA) 25 MG tablet Take 1 tablet (25 mg total) by mouth daily. 90 tablet 0   spironolactone (ALDACTONE) 25 MG tablet Take 1 tablet (25 mg total) by mouth daily. 90 tablet 0   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 Take 1 capsule by mouth  daily. 30 capsule 11   hydrocortisone cream 1 % Apply to affected area 2 times daily (Patient not taking: Reported on 12/07/2021) 30 g 0   No current facility-administered medications for this encounter.   Allergies  Allergen Reactions   Bee Venom     swelling   Social History   Socioeconomic History   Marital status: Single    Spouse name: Not on file   Number of children: Not on file   Years of education: Not on file   Highest education level: Not on file  Occupational History   Not on file  Tobacco Use   Smoking status: Former    Types: Cigarettes    Quit date: 10/06/2017    Years since quitting: 4.1   Smokeless tobacco: Never  Substance and Sexual Activity   Alcohol use: Not Currently   Drug use: Yes    Types: Marijuana   Sexual activity: Not on file  Other Topics Concern   Not on file  Social History Narrative   Not on file   Social Determinants of Health   Financial Resource Strain: Low Risk  (03/10/2021)   Overall Financial Resource Strain (CARDIA)    Difficulty of Paying Living Expenses: Not very hard  Food Insecurity: No Food Insecurity (03/10/2021)   Hunger Vital Sign    Worried About Running Out of Food in the Last Year: Never true    Ran Out of Food in the Last Year: Never true  Transportation Needs: Unmet Transportation Needs (08/25/2021)   PRAPARE - Hydrologist (Medical): Yes    Lack of Transportation (Non-Medical): Yes  Physical Activity: Not on file  Stress: Not on file  Social Connections: Not on file  Intimate Partner Violence: Not on file    No family history on file.  BP (!) 148/100   Pulse (!) 105   Wt 72.2 kg (159 lb 3.2 oz)   SpO2 97%   BMI 23.51 kg/m   Wt Readings from Last 3 Encounters:  12/07/21 72.2 kg (159 lb 3.2 oz)  10/05/21 70.3 kg (155 lb)  09/02/21 67.6 kg (149 lb)   PHYSICAL EXAM: General:  NAD. No resp difficulty, arrived in [facility], chronically-ill appearing. HEENT: Normal Neck: Supple. JVP  to ear. Carotids 2+ bilat; no bruits. No lymphadenopathy or thryomegaly appreciated. Cor: PMI nondisplaced. Tachy rate & rhythm. No rubs, gallops or murmurs. Lungs: Diminished in bases. Abdomen: Soft, nontender, nondistended. No hepatosplenomegaly. No bruits or masses. Good bowel sounds. Extremities: No cyanosis, clubbing, rash, 2+ BLE edema to knees Neuro: Alert & oriented x 3, cranial nerves grossly intact. Moves all 4 extremities w/o difficulty. Affect pleasant.  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 He reports cardiomyopathy diagnosed just prior to MI while living in [place] a couple of years ago. - Echo (10/22): EF 20-25%, RV ok - R/LHC (10/22): Patent RPLV stent with nonobstructive disease elsewhere, Preserved CO/CI, RA mean 5 mmHg, PCWP mean 5 mmHg - cMRI (11/22): LVEF 24% RVEF 21% LGE and ECW suggestive of cardiac amyloidosis. 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 I thought equivocal but based on cMRI likely positive Will need genetic testing. - Multiple myeloma panel (12/22) with no m-spike. - NYHA III today, physically limited by neuropathy in feet. Volume up on exam, REDs 37%, weight up 10 lbs. Ran out of Lasix. - Restart Lasix 40 mg daily. - Continue hydralazine 50 mg tid and Imdur 30 mg daily. - Continue Entresto 97/103 mg bid. - Continue carvedilol 6.25 mg bid.  - Continue spironolactone 25 mg daily.  - No SGLT2i w/ A1c 12. - Await genetic testing.   - Continue tafamadis, and will likely benefit from [place] as well - He has been referred to Dr. Posey Pronto in Neurology. Unclear if neuropathy due to DM2 or TTR - BMET and BNP today.   2. CAD - Prior MI followed by PCI in [place] in either 2019 or 2020.  - Patent RPLV stent on [facility] 10/22. Also had 20% distal RCA, 30% p LAD, 70% d LAD, 60% OM2 - Chest tightness this AM likely due to fluid overload. - Continue ASA + Plavix. -  Continue atorvastatin 80 mg daily. - Consider increasing Imdur next.   3. HTN - Elevated. - Diurese as above. - Consider increasing hydral/Iso next.  4. Uncontrolled DM - Needs PCP follow up. - On Januvia. - No SGLT2i with A1c 12.  5. PVCs - Denies palpitations. - ECG ok today.  6. Neuropathy - This seems to be his biggest limiter. - On gabapentin. - Referred to Dr. Posey Pronto in Neurology. Consider Amvuttra.  7. SDOH - He has Medicaid and disability. - Transportation is an issue, he has a car but unable to drive (needs license). - 79 year old son helps with meds. - Engage HFSW for paramedicine and medication compliance.  Follow up in 3 weeks with APP and 3-4 months with Dr. Haroldine Laws.  Rafael Bihari, FNP  10:54 AM

## 2021-12-07 NOTE — Telephone Encounter (Signed)
   Telephone encounter was:  Unsuccessful.  12/07/2021 Name: Thomas West MRN: [id number] DOB: [birth year]  Unsuccessful outbound call made today to assist with:  Transportation Needs   Outreach Attempt:  1st Attempt could not leave a message, no voicemail set up    [facility] Guide, Embedded Care Coordination [facility], Care Management  [phone] [address] [place], [place], [place] [address] Phone: [phone] Email: [name]

## 2021-12-07 NOTE — Addendum Note (Signed)
Encounter addended by: Jacklynn Ganong, FNP on: 12/07/2021 1:03 PM  Actions taken: Clinical Note Signed

## 2021-12-07 NOTE — Progress Notes (Signed)
Heart and Vascular Care Navigation  12/07/2021  Thomas West September 12, 1960 [id number]  Reason for Referral: paramedicine   Engaged with patient face to face for initial visit for Heart and Vascular Care Coordination.                                                                                                   Paramedicine Initial Assessment:  Housing:  In what kind of housing do you live? House/apt/trailer/shelter? townhome  Do you rent/pay a mortgage/own? rent  Do you live with anyone? 44 yo son  Are you currently worried about losing your housing? no   Social:  What is your current marital status? Not assessed  Do you have any children? 14yo son who lives with pt full time  Do you have family or friends who live locally? Has whole family in townhouse community   Food:  [employer] but states that this is not sufficient and can lead to having to take money away from bills in order to get by  Income:  What is your current source of income? $1500 in disability  How hard is it for you to pay for the basics like food housing, medical care, and utilities? Somewhat hard  Do you have outstanding medical bills?  Insurance:  Are you currently insured? yes  Do you have prescription coverage? yes   Transportation:  Do you have transportation to your medical appointments? Yes- has his own car and is working on getting transport through insurance benefits  Pt agreeable to CSW placing [facility] consult to help evaluate insurance benefits to help with transport to future appts   Daily Health Needs: Do you have a working scale at home? yes  How do you manage your medications at home? 85 yo son manages his medications and give them to him  Do you ever take your medications differently than prescribed? no   Do you have issues affording your medications? no  Do you have any concerns with mobility at home? Walker but doesn't use often neuropathy limits mobility   Do you  have a PCP? Daivd Council  Do you have any trouble reading or writing? no  Are there any additional barriers you see to getting the care you need? no                                     HRT/VAS Care Coordination     Patients Home Cardiology Office Heart Failure Clinic   Outpatient Care Team Community Paramedicine; Social Worker   Social Worker Name: Naaman Plummer, [phone]   Living arrangements for the past 2 months --  townhome   Lives with: Minor Children  14yo son   Patient Current Insurance Coverage Managed Medicare; Medicaid   Patient Has Concern With Paying Medical Bills No   Does Patient Have Prescription Coverage? Yes   Home Assistive Devices/Equipment Bedside commode/3-in-1; Dan Humphreys (specify type)   DME Agency NA   [facility]   Current home services  DME  Patient stats he has a rolling walker at home.       Social History:                                                                             SDOH Screenings   Alcohol Screen: Not on file  Depression (PHQ2-9): Low Risk  (02/03/2021)   Depression (PHQ2-9)    PHQ-2 Score: 0  Financial Resource Strain: Low Risk  (03/10/2021)   Overall Financial Resource Strain (CARDIA)    Difficulty of Paying Living Expenses: Not very hard  Food Insecurity: Food Insecurity Present (12/07/2021)   Hunger Vital Sign    Worried About Running Out of Food in the Last Year: Sometimes true    Ran Out of Food in the Last Year: Never true  Housing: Low Risk  (12/07/2021)   Housing    Last Housing Risk Score: 0  Physical Activity: Not on file  Social Connections: Not on file  Stress: Not on file  Tobacco Use: Medium Risk (12/07/2021)   Patient History    Smoking Tobacco Use: Former    Smokeless Tobacco Use: Never    Passive Exposure: Not on file  Transportation Needs: Unmet Transportation Needs (08/25/2021)   PRAPARE - Administrator, Civil Service (Medical): Yes    Lack of Transportation  (Non-Medical): Yes    SDOH Interventions: Financial Resources:    N/a  Food Insecurity:  Food Insecurity Interventions: Other (Comment) (food pantry list given)  Housing Insecurity:  Housing Interventions: Intervention Not Indicated  Transportation:   Transportation Interventions: Payor Benefit, Other (Comment) (has his own car)   Follow-up plan:    CSW sending referral to paramedics for assignment and follow up  Burna Sis, LCSW Clinical Social Worker [facility] Desk#: [phone] Cell#: [phone]

## 2021-12-07 NOTE — Progress Notes (Signed)
ReDS Vest / Clip - 12/07/21 1100       ReDS Vest / Clip   Station Marker C    Ruler Value 30    ReDS Value Range Moderate volume overload    ReDS Actual Value 37    Anatomical Comments sitting

## 2021-12-07 NOTE — ED Provider Notes (Signed)
[facility] EMERGENCY DEPARTMENT Provider Note   CSN: [id number] Arrival date & time: 12/07/21  1913     History  Chief Complaint  Patient presents with   Fall   Back Pain   Chest Pain    Thomas West is a 61 y.o. male.  HPI Patient presents after a fall.  His medical history includes CAD, CHF, DM, HTN, CVA, CKD, HLD, GERD, peripheral neuropathy.  He has had recent chest tightness and exertional shortness of breath.  He experiences occasional dizziness.  He has had increased leg swelling.  He does endorse medication nonadherence.  He was seen at heart failure clinic earlier today.  He was found to be up 10 pounds from his dry weight.  While at the office, he endorsed chest pain and shortness of breath.  He was advised to come to the ED.  Patient declined and went home instead.  When he was at home, he had a mechanical fall.  He attributes this to his peripheral neuropathy.  During this fall, he was walking downstairs.  He fell landing on his sacral area and sliding down the remainder of the steps.  He has since experienced pain in the area of his sacrum.  He continues to have chest pain that was present prior to the fall.  He endorses shortness of breath.  Patient reports that he has not been taking his Lasix or his blood pressure medications.  He has been taking his Entresto and some other medications which she does not recall the name of.    Home Medications Prior to Admission medications   Medication Sig Start Date End Date Taking? Authorizing Provider  amoxicillin-clavulanate (AUGMENTIN) 875-125 MG tablet Take 1 tablet by mouth every 12 (twelve) hours. 11/10/21   Charlynne Pander, MD  aspirin 81 MG EC tablet Take 1 tablet (81 mg total) by mouth daily. 07/15/21 07/10/22  Darlin Drop, DO  atorvastatin (LIPITOR) 80 MG tablet Take 1 tablet (80 mg total) by mouth daily. 07/15/21 12/07/21  Darlin Drop, DO  carvedilol (COREG) 6.25 MG tablet Take 1 tablet (6.25 mg  total) by mouth EVERY 12 HOURS (10AM &10PM) 07/15/21 12/07/21  Darlin Drop, DO  furosemide (LASIX) 40 MG tablet Take 1 tablet (40 mg total) by mouth daily. 12/07/21 06/05/22  Jacklynn Ganong, FNP  hydrALAZINE (APRESOLINE) 50 MG tablet Take 1 tablet (50 mg total) by mouth 3 (three) times daily. 07/15/21 12/07/21  Darlin Drop, DO  hydrocortisone cream 1 % Apply to affected area 2 times daily Patient not taking: Reported on 12/07/2021 11/04/21   Bensimhon, Bevelyn Buckles, MD  hydrOXYzine (ATARAX) 25 MG tablet Take 1 tablet (25 mg total) by mouth every 6 (six) hours. 09/20/21   Jari Sportsman, MD  isosorbide mononitrate (IMDUR) 30 MG 24 hr tablet Take 1 tablet (30 mg total) by mouth daily. 09/02/21   Bensimhon, Bevelyn Buckles, MD  methocarbamol (ROBAXIN) 500 MG tablet Take 1 tablet (500 mg total) by mouth 2 (two) times daily. 08/18/21   Prosperi, Christian H, PA-C  potassium chloride (KLOR-CON M) 10 MEQ tablet Take 1 tablet (10 mEq total) by mouth daily. 07/15/21 12/07/21  Darlin Drop, DO  sacubitril-valsartan (ENTRESTO) 97-103 MG Take 1 tablet by mouth 2 (two) times daily. 09/02/21   Bensimhon, Bevelyn Buckles, MD  sitaGLIPtin (JANUVIA) 25 MG tablet Take 1 tablet (25 mg total) by mouth daily. 07/15/21 12/07/21  Darlin Drop, DO  spironolactone (ALDACTONE) 25 MG tablet Take  1 tablet (25 mg total) by mouth daily. 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 09/02/21   Bensimhon, Bevelyn Buckles, MD      Allergies    Bee venom    Review of Systems   Review of Systems  Constitutional:  Positive for fatigue.  Respiratory:  Positive for chest tightness and shortness of breath.   Cardiovascular:  Positive for chest pain and leg swelling.  Musculoskeletal:  Positive for back pain.  All other systems reviewed and are negative.   Physical Exam Updated Vital Signs BP (!) 128/99   Pulse 84   Temp 98.9 F (37.2 C) (Oral)   Resp 18   Ht 5\' 9"  (1.753 m)   Wt 72 kg   SpO2 98%    BMI 23.44 kg/m  Physical Exam Vitals and nursing note reviewed.  Constitutional:      General: He is not in acute distress.    Appearance: He is well-developed and normal weight. He is not ill-appearing, toxic-appearing or diaphoretic.  HENT:     Head: Normocephalic and atraumatic.  Eyes:     Conjunctiva/sclera: Conjunctivae normal.  Cardiovascular:     Rate and Rhythm: Normal rate and regular rhythm.     Heart sounds: No murmur heard. Pulmonary:     Effort: Pulmonary effort is normal. No tachypnea or respiratory distress.     Breath sounds: No decreased breath sounds, wheezing, rhonchi or rales.  Chest:     Chest wall: No tenderness.  Abdominal:     Palpations: Abdomen is soft.     Tenderness: There is no abdominal tenderness.  Musculoskeletal:        General: No swelling. Normal range of motion.     Cervical back: Normal range of motion and neck supple.     Right lower leg: Edema present.     Left lower leg: Edema present.     Comments: L5 tenderness  Skin:    General: Skin is warm and dry.     Capillary Refill: Capillary refill takes less than 2 seconds.  Neurological:     General: No focal deficit present.     Mental Status: He is alert and oriented to person, place, and time.  Psychiatric:        Mood and Affect: Mood normal.        Behavior: Behavior normal.     ED Results / Procedures / Treatments   Labs (all labs ordered are listed, but only abnormal results are displayed) Labs Reviewed  COMPREHENSIVE METABOLIC PANEL - Abnormal; Notable for the following components:      Result Value   Glucose, Bld 135 (*)    Creatinine, Ser 1.40 (*)    Calcium 8.5 (*)    Albumin 3.4 (*)    Alkaline Phosphatase 143 (*)    GFR, Estimated 58 (*)    All other components within normal limits  MAGNESIUM - Abnormal; Notable for the following components:   Magnesium 1.6 (*)    All other components within normal limits  CBC WITH DIFFERENTIAL/PLATELET - Abnormal; Notable for the  following components:   Hemoglobin 12.6 (*)    Platelets 431 (*)    All other components within normal limits  CBC - Abnormal; Notable for the following components:   RBC 3.83 (*)    Hemoglobin 10.9 (*)    HCT 33.4 (*)    All other components within normal limits  TROPONIN I (  HIGH SENSITIVITY) - Abnormal; Notable for the following components:   Troponin I (High Sensitivity) 20 (*)    All other components within normal limits  TROPONIN I (HIGH SENSITIVITY) - Abnormal; Notable for the following components:   Troponin I (High Sensitivity) 42 (*)    All other components within normal limits  LIPASE, BLOOD  BASIC METABOLIC PANEL  CREATININE, SERUM  MAGNESIUM  TROPONIN I (HIGH SENSITIVITY)    EKG EKG Interpretation  Date/Time:  Monday December 07 2021 19:23:25 EDT Ventricular Rate:  104 PR Interval:  174 QRS Duration: 82 QT Interval:  376 QTC Calculation: 494 R Axis:   92 Text Interpretation: Sinus tachycardia Rightward axis Nonspecific T wave abnormality Abnormal ECG Confirmed by Gloris Manchester [phone]) on 12/07/2021 9:17:54 PM  Radiology DG Chest 1 View  Result Date: 12/07/2021 CLINICAL DATA:  Fall with chest pain EXAM: CHEST  1 VIEW COMPARISON:  11/10/2021 FINDINGS: Cardiomegaly. Central pulmonary arteries appear enlarged. No pleural effusion or pneumothorax. IMPRESSION: 1. Mild cardiomegaly. 2. Prominent central pulmonary arteries suggestive of arterial hypertension Electronically Signed   By: Jasmine Pang M.D.   On: 12/07/2021 21:03   DG Lumbar Spine Complete  Result Date: 12/07/2021 CLINICAL DATA:  Fall EXAM: LUMBAR SPINE - COMPLETE 4+ VIEW COMPARISON:  CT 08/03/2021 FINDINGS: Alignment within normal limits. Vertebral body heights are maintained. The disc spaces are patent. Aortic atherosclerosis. IMPRESSION: No acute osseous abnormality Electronically Signed   By: Jasmine Pang M.D.   On: 12/07/2021 21:03    Procedures Procedures    Medications Ordered in ED Medications   hydrALAZINE (APRESOLINE) tablet 50 mg (50 mg Oral Given 12/07/21 2330)  carvedilol (COREG) tablet 6.25 mg (6.25 mg Oral Given 12/07/21 2330)  spironolactone (ALDACTONE) tablet 25 mg (25 mg Oral Given 12/07/21 2330)  lidocaine (LIDODERM) 5 % 1 patch (1 patch Transdermal Patch Applied 12/07/21 2334)  sodium chloride flush (NS) 0.9 % injection 3 mL (3 mLs Intravenous Given 12/08/21 0111)  sodium chloride flush (NS) 0.9 % injection 3 mL (has no administration in time range)  0.9 %  sodium chloride infusion (has no administration in time range)  acetaminophen (TYLENOL) tablet 650 mg (has no administration in time range)  ondansetron (ZOFRAN) injection 4 mg (has no administration in time range)  enoxaparin (LOVENOX) injection 40 mg (has no administration in time range)  furosemide (LASIX) injection 80 mg (has no administration in time range)  aspirin EC tablet 81 mg (has no administration in time range)  atorvastatin (LIPITOR) tablet 80 mg (has no administration in time range)  isosorbide mononitrate (IMDUR) 24 hr tablet 30 mg (has no administration in time range)  methocarbamol (ROBAXIN) tablet 500 mg (500 mg Oral Not Given 12/08/21 0110)  sacubitril-valsartan (ENTRESTO) 97-103 mg per tablet (0 tablets Oral Hold 12/08/21 0120)  Tafamidis CAPS 61 mg (has no administration in time range)  furosemide (LASIX) injection 40 mg (40 mg Intravenous Given 12/07/21 2111)  oxyCODONE-acetaminophen (PERCOCET/ROXICET) 5-325 MG per tablet 1 tablet (1 tablet Oral Given 12/07/21 2111)  nitroGLYCERIN (NITROGLYN) 2 % ointment 1 inch (1 inch Topical Given 12/07/21 2111)  magnesium sulfate IVPB 2 g 50 mL (0 g Intravenous Stopped 12/07/21 2307)  methocarbamol (ROBAXIN) tablet 500 mg (500 mg Oral Given 12/07/21 2334)  oxyCODONE-acetaminophen (PERCOCET/ROXICET) 5-325 MG per tablet 1 tablet (1 tablet Oral Given 12/07/21 2334)    ED Course/ Medical Decision Making/ A&P  Medical Decision Making Amount  and/or Complexity of Data Reviewed Labs: ordered. Radiology: ordered.  Risk Prescription drug management. Decision regarding hospitalization.   This patient presents to the ED for concern of multiple complaints, this involves an extensive number of treatment options, and is a complaint that carries with it a high risk of complications and morbidity.  The differential diagnosis includes acute injuries from his fall, pulmonary edema, ACS, hypertensive crisis   Co morbidities that complicate the patient evaluation  CAD, CHF, DM, HTN, CVA, CKD, HLD, GERD, peripheral neuropathy   Additional history obtained:  Additional history obtained from N/A External records from outside source obtained and reviewed including EMR   Lab Tests:  I Ordered, and personally interpreted labs.  The pertinent results include: BNP for earlier office visit is markedly elevated; labs in the ER showed baseline CKD, hypomagnesemia with otherwise normal electrolytes; no leukocytosis, mild decrease in hemoglobin from baseline, mildly elevated troponin which did increase from 20-42 on repeat   Imaging Studies ordered:  I ordered imaging studies including x-ray imaging of chest and lumbar spine I independently visualized and interpreted imaging which showed no acute findings I agree with the radiologist interpretation   Cardiac Monitoring: / EKG:  The patient was maintained on a cardiac monitor.  I personally viewed and interpreted the cardiac monitored which showed an underlying rhythm of: Sinus rhythm   Consultations Obtained:  I requested consultation with the cardiologist, Dr. Patsi Sears,  and discussed lab and imaging findings as well as pertinent plan - they recommend: Admission to their service   Problem List / ED Course / Critical interventions / Medication management  Patient is a 61 year old male presenting from home for multiple complaints.  This morning, he woke up with a left-sided chest  pain and tightness.  He was seen by his heart failure doctor's office earlier today.  At that time, he endorsed medication nonadherence and his ongoing chest pain.  He was advised to come to the ED from there.  Patient went home instead and did suffer a mechanical fall while walking down some steps.  During this fall, he landed on his backside and slid down approximately 7 steps.  He has since had pain in the area of his distal lower back.  He does also continue to endorse chest pain and tightness.  Vital signs on arrival are notable for hypertension.  I suspect this is secondary to pain and medication nonadherence.  Patient was given NTG ointment for his chest pain and hypertension as well as Percocet for analgesia.  Diagnostic work-up was initiated.  On x-ray imaging of lumbar spine, there are no identifiable osseous abnormalities.  Patient was given lidocaine patch and Robaxin for continued analgesia.  On lab work, patient has hypomagnesemia on replacement magnesium was ordered.  Patient's troponin is mildly elevated at 20, which is consistent with his baseline.  On repeat, troponin elevated to 42.  I discussed this with cardiologist on-call, Dr. Elsie Saas who graciously came and evaluated the patient in the ED.  He will admit patient to their service. I ordered medication including Percocet, lidocaine patch, Robaxin for analgesia; magnesium sulfate for hypomagnesemia; Lasix for diuresis; NTG and home blood pressure medications for hypertension Reevaluation of the patient after these medicines showed that the patient improved I have reviewed the patients home medicines and have made adjustments as needed   Social Determinants of Health:  Has access to outpatient care        Final Clinical Impression(s) / ED Diagnoses  Final diagnoses:  Fall, initial encounter  Chest pain, unspecified type  Acute midline low back pain without sciatica    Rx / DC Orders ED Discharge Orders     None          Gloris Manchester, MD 12/08/21 0127

## 2021-12-08 ENCOUNTER — Telehealth: Payer: Self-pay

## 2021-12-08 ENCOUNTER — Other Ambulatory Visit: Payer: Self-pay | Admitting: Family Medicine

## 2021-12-08 ENCOUNTER — Ambulatory Visit: Payer: Medicare Other

## 2021-12-08 ENCOUNTER — Inpatient Hospital Stay (HOSPITAL_COMMUNITY): Payer: Medicare Other

## 2021-12-08 DIAGNOSIS — I5020 Unspecified systolic (congestive) heart failure: Secondary | ICD-10-CM | POA: Diagnosis not present

## 2021-12-08 DIAGNOSIS — N189 Chronic kidney disease, unspecified: Secondary | ICD-10-CM | POA: Diagnosis present

## 2021-12-08 DIAGNOSIS — Z87891 Personal history of nicotine dependence: Secondary | ICD-10-CM | POA: Diagnosis not present

## 2021-12-08 DIAGNOSIS — Z955 Presence of coronary angioplasty implant and graft: Secondary | ICD-10-CM | POA: Diagnosis not present

## 2021-12-08 DIAGNOSIS — I472 Ventricular tachycardia, unspecified: Secondary | ICD-10-CM | POA: Diagnosis present

## 2021-12-08 DIAGNOSIS — E1165 Type 2 diabetes mellitus with hyperglycemia: Secondary | ICD-10-CM | POA: Diagnosis present

## 2021-12-08 DIAGNOSIS — E785 Hyperlipidemia, unspecified: Secondary | ICD-10-CM | POA: Diagnosis present

## 2021-12-08 DIAGNOSIS — Z91148 Patient's other noncompliance with medication regimen for other reason: Secondary | ICD-10-CM | POA: Diagnosis not present

## 2021-12-08 DIAGNOSIS — W109XXA Fall (on) (from) unspecified stairs and steps, initial encounter: Secondary | ICD-10-CM | POA: Diagnosis present

## 2021-12-08 DIAGNOSIS — E1122 Type 2 diabetes mellitus with diabetic chronic kidney disease: Secondary | ICD-10-CM | POA: Diagnosis present

## 2021-12-08 DIAGNOSIS — T501X6A Underdosing of loop [high-ceiling] diuretics, initial encounter: Secondary | ICD-10-CM | POA: Diagnosis present

## 2021-12-08 DIAGNOSIS — I43 Cardiomyopathy in diseases classified elsewhere: Secondary | ICD-10-CM | POA: Diagnosis present

## 2021-12-08 DIAGNOSIS — E1142 Type 2 diabetes mellitus with diabetic polyneuropathy: Secondary | ICD-10-CM | POA: Diagnosis present

## 2021-12-08 DIAGNOSIS — I5023 Acute on chronic systolic (congestive) heart failure: Secondary | ICD-10-CM | POA: Diagnosis present

## 2021-12-08 DIAGNOSIS — Z8673 Personal history of transient ischemic attack (TIA), and cerebral infarction without residual deficits: Secondary | ICD-10-CM | POA: Diagnosis not present

## 2021-12-08 DIAGNOSIS — I493 Ventricular premature depolarization: Secondary | ICD-10-CM | POA: Diagnosis present

## 2021-12-08 DIAGNOSIS — I130 Hypertensive heart and chronic kidney disease with heart failure and stage 1 through stage 4 chronic kidney disease, or unspecified chronic kidney disease: Secondary | ICD-10-CM | POA: Diagnosis present

## 2021-12-08 DIAGNOSIS — Z91199 Patient's noncompliance with other medical treatment and regimen due to unspecified reason: Secondary | ICD-10-CM | POA: Diagnosis not present

## 2021-12-08 DIAGNOSIS — Z79899 Other long term (current) drug therapy: Secondary | ICD-10-CM | POA: Diagnosis not present

## 2021-12-08 DIAGNOSIS — I509 Heart failure, unspecified: Secondary | ICD-10-CM | POA: Diagnosis present

## 2021-12-08 DIAGNOSIS — N179 Acute kidney failure, unspecified: Secondary | ICD-10-CM | POA: Diagnosis present

## 2021-12-08 DIAGNOSIS — I252 Old myocardial infarction: Secondary | ICD-10-CM | POA: Diagnosis not present

## 2021-12-08 DIAGNOSIS — I2510 Atherosclerotic heart disease of native coronary artery without angina pectoris: Secondary | ICD-10-CM | POA: Diagnosis present

## 2021-12-08 DIAGNOSIS — E1169 Type 2 diabetes mellitus with other specified complication: Secondary | ICD-10-CM

## 2021-12-08 DIAGNOSIS — Z7982 Long term (current) use of aspirin: Secondary | ICD-10-CM | POA: Diagnosis not present

## 2021-12-08 DIAGNOSIS — E854 Organ-limited amyloidosis: Secondary | ICD-10-CM | POA: Diagnosis present

## 2021-12-08 DIAGNOSIS — R079 Chest pain, unspecified: Secondary | ICD-10-CM | POA: Diagnosis not present

## 2021-12-08 LAB — BASIC METABOLIC PANEL
Anion gap: 12 (ref 5–15)
BUN: 15 mg/dL (ref 6–20)
CO2: 27 mmol/L (ref 22–32)
Calcium: 8.8 mg/dL — ABNORMAL LOW (ref 8.9–10.3)
Chloride: 101 mmol/L (ref 98–111)
Creatinine, Ser: 1.37 mg/dL — ABNORMAL HIGH (ref 0.61–1.24)
GFR, Estimated: 60 mL/min — ABNORMAL LOW (ref >60)
Glucose, Bld: 165 mg/dL — ABNORMAL HIGH (ref 70–99)
Potassium: 4.7 mmol/L (ref 3.5–5.1)
Sodium: 140 mmol/L (ref 135–145)

## 2021-12-08 LAB — CBC
HCT: 33.4 % — ABNORMAL LOW (ref 39.0–52.0)
Hemoglobin: 10.9 g/dL — ABNORMAL LOW (ref 13.0–17.0)
MCH: 28.5 pg (ref 26.0–34.0)
MCHC: 32.6 g/dL (ref 30.0–36.0)
MCV: 87.2 fL (ref 80.0–100.0)
Platelets: 375 10*3/uL (ref 150–400)
RBC: 3.83 MIL/uL — ABNORMAL LOW (ref 4.22–5.81)
RDW: 15.4 % (ref 11.5–15.5)
WBC: 6.6 10*3/uL (ref 4.0–10.5)
nRBC: 0 % (ref 0.0–0.2)

## 2021-12-08 LAB — MAGNESIUM: Magnesium: 2 mg/dL (ref 1.7–2.4)

## 2021-12-08 LAB — CREATININE, SERUM
Creatinine, Ser: 1.21 mg/dL (ref 0.61–1.24)
GFR, Estimated: >60 mL/min (ref >60)

## 2021-12-08 LAB — TROPONIN I (HIGH SENSITIVITY)
Troponin I (High Sensitivity): 18 ng/L — ABNORMAL HIGH (ref <18)
Troponin I (High Sensitivity): 18 ng/L — ABNORMAL HIGH (ref <18)

## 2021-12-08 MED ORDER — SODIUM CHLORIDE 0.9 % IV SOLN: 250.0000 mL | INTRAVENOUS | Status: DC | PRN

## 2021-12-08 MED ORDER — ASPIRIN 81 MG PO TBEC
81.0000 mg | DELAYED_RELEASE_TABLET | Freq: Every day | ORAL | Status: DC
  Administered 2021-12-08 – 2021-12-10 (×3): 81 mg via ORAL
  Filled 2021-12-08 (×3): qty 1

## 2021-12-08 MED ORDER — SODIUM CHLORIDE 0.9% FLUSH
3.0000 mL | Freq: Two times a day (BID) | INTRAVENOUS | Status: DC
  Administered 2021-12-08 – 2021-12-10 (×6): 3 mL via INTRAVENOUS

## 2021-12-08 MED ORDER — ONDANSETRON HCL 4 MG/2ML IJ SOLN
4.0000 mg | Freq: Four times a day (QID) | INTRAMUSCULAR | Status: DC | PRN
  Administered 2021-12-09: 4 mg via INTRAVENOUS
  Filled 2021-12-08: qty 2

## 2021-12-08 MED ORDER — CARVEDILOL 3.125 MG PO TABS: 6.2500 mg | ORAL_TABLET | Freq: Two times a day (BID) | ORAL | Status: DC

## 2021-12-08 MED ORDER — FUROSEMIDE 10 MG/ML IJ SOLN
80.0000 mg | Freq: Two times a day (BID) | INTRAMUSCULAR | Status: AC
  Administered 2021-12-09 (×2): 80 mg via INTRAVENOUS
  Filled 2021-12-08 (×2): qty 8

## 2021-12-08 MED ORDER — ENOXAPARIN SODIUM 40 MG/0.4ML IJ SOSY
40.0000 mg | PREFILLED_SYRINGE | INTRAMUSCULAR | Status: DC
  Filled 2021-12-08 (×3): qty 0.4

## 2021-12-08 MED ORDER — ATORVASTATIN CALCIUM 80 MG PO TABS
80.0000 mg | ORAL_TABLET | Freq: Every day | ORAL | Status: DC
  Administered 2021-12-08 – 2021-12-10 (×3): 80 mg via ORAL
  Filled 2021-12-08: qty 1
  Filled 2021-12-08: qty 2
  Filled 2021-12-08: qty 1

## 2021-12-08 MED ORDER — SODIUM CHLORIDE 0.9% FLUSH: 3.0000 mL | INTRAVENOUS | Status: DC | PRN

## 2021-12-08 MED ORDER — ACETAMINOPHEN 325 MG PO TABS
650.0000 mg | ORAL_TABLET | ORAL | Status: DC | PRN
  Administered 2021-12-09 (×2): 650 mg via ORAL
  Filled 2021-12-08 (×3): qty 2

## 2021-12-08 MED ORDER — METHOCARBAMOL 500 MG PO TABS
500.0000 mg | ORAL_TABLET | Freq: Two times a day (BID) | ORAL | Status: DC
Start: 2021-12-08 — End: 2021-12-10
  Administered 2021-12-08 – 2021-12-10 (×5): 500 mg via ORAL
  Filled 2021-12-08 (×5): qty 1

## 2021-12-08 MED ORDER — SACUBITRIL-VALSARTAN 97-103 MG PO TABS
1.0000 | ORAL_TABLET | Freq: Two times a day (BID) | ORAL | Status: DC
  Administered 2021-12-08 – 2021-12-09 (×4): 1 via ORAL
  Filled 2021-12-08 (×7): qty 1

## 2021-12-08 MED ORDER — ISOSORBIDE MONONITRATE ER 30 MG PO TB24
30.0000 mg | ORAL_TABLET | Freq: Every day | ORAL | Status: DC
  Administered 2021-12-08 – 2021-12-10 (×3): 30 mg via ORAL
  Filled 2021-12-08 (×3): qty 1

## 2021-12-08 MED ORDER — TAFAMIDIS 61 MG PO CAPS: 61.0000 mg | ORAL_CAPSULE | Freq: Every day | ORAL | Status: DC

## 2021-12-08 MED ORDER — SPIRONOLACTONE 25 MG PO TABS: 25.0000 mg | ORAL_TABLET | Freq: Every day | ORAL | Status: DC

## 2021-12-08 MED ORDER — OXYCODONE-ACETAMINOPHEN 5-325 MG PO TABS
1.0000 | ORAL_TABLET | Freq: Once | ORAL | Status: AC
  Administered 2021-12-08: 1 via ORAL
  Filled 2021-12-08: qty 1

## 2021-12-08 MED ORDER — HYDRALAZINE HCL 25 MG PO TABS: 50.0000 mg | ORAL_TABLET | Freq: Three times a day (TID) | ORAL | Status: DC

## 2021-12-08 MED ORDER — SENNOSIDES-DOCUSATE SODIUM 8.6-50 MG PO TABS
1.0000 | ORAL_TABLET | Freq: Two times a day (BID) | ORAL | Status: DC | PRN
  Administered 2021-12-08 – 2021-12-09 (×3): 1 via ORAL
  Filled 2021-12-08 (×3): qty 1

## 2021-12-08 NOTE — ED Notes (Signed)
ED TO INPATIENT HANDOFF REPORT  ED Nurse Name and Phone #: Lysbeth Galas [phone]  S Name/Age/Gender Thomas West 61 y.o. male Room/Bed: 041C/041C  Code Status   Code Status: Full Code  Home/SNF/Other Home Patient oriented to: self, place, time, and situation Is this baseline? Yes   Triage Complete: Triage complete  Chief Complaint Heart failure [facility]) [I50.9]  Triage Note Patient arrives to ED via EMS from home s/p fall down about 7 stairs, patient complains of low back pain. Patient also reports he went to see his cardiologist this AM and was told he had a lot of fluid on him and he needed to come the hospital ASAP, however patient states he went home. Patient is c/o chest pain, states he has been out of all his medication for about 1 month and hasn't taken his plavix or BP medication. Patient is A&O x 4. EKG obtained and given to Dr. Doren Custard.    Allergies Allergies  Allergen Reactions   Bee Venom     swelling    Level of Care/Admitting Diagnosis ED Disposition     ED Disposition  Admit   Condition  --   Comment  Hospital Area: [place] [[id number]]  Level of Care: Telemetry Cardiac [103]  May admit patient to [facility] or [facility] if equivalent level of care is available:: No  Covid Evaluation: Asymptomatic - no recent exposure (last 10 days) testing not required  Diagnosis: Heart failure [facility]) [id number]  Admitting Physician: TANNENBAUM, [name] [id number]  Attending Physician: TANNENBAUM, [name] [id number]  Certification:: I certify this patient will need inpatient services for at least 2 midnights  Estimated Length of Stay: 3          B Medical/Surgery History Past Medical History:  Diagnosis Date   CHF (congestive heart failure) ([place])    Coronary artery disease    Diabetes mellitus without complication ([place])    History of MI (myocardial infarction) 03/05/2019   Hypertension    Stroke [facility])    Past Surgical History:  Procedure  Laterality Date   leg surgery     "pins in left shin"   RIGHT/LEFT HEART CATH AND CORONARY ANGIOGRAPHY N/A 03/11/2021   Procedure: RIGHT/LEFT HEART CATH AND CORONARY ANGIOGRAPHY;  Surgeon: Early Osmond, MD;  Location: [place] [facility];  Service: Cardiovascular;  Laterality: N/A;     A IV Location/Drains/Wounds Patient Lines/Drains/Airways Status     Active Line/Drains/Airways     Name Placement date Placement time Site Days   Peripheral IV 12/07/21 20 G Anterior;Distal;Left;Upper Arm 12/07/21  2032  Arm  1   Wound / Incision (Open or Dehisced) 04/21/21 Other (Comment) Heel Left 04/21/21  0030  Heel  231            Intake/Output Last 24 hours  Intake/Output Summary (Last 24 hours) at 12/08/2021 1356 Last data filed at 12/08/2021 0110 Gross per 24 hour  Intake --  Output 1300 ml  Net -1300 ml    Labs/Imaging Results for orders placed or performed during the hospital encounter of 12/07/21 (from the past 48 hour(s))  Troponin I (High Sensitivity)     Status: Abnormal   Collection Time: 12/07/21  8:32 PM  Result Value Ref Range   Troponin I (High Sensitivity) 20 (H) <18 ng/L    Comment: (NOTE) Elevated high sensitivity troponin I (hsTnI) values and significant  changes across serial measurements may suggest ACS but many other  chronic and acute conditions are known to elevate hsTnI  results.  Refer to the "Links" section for chest pain algorithms and additional  guidance. Performed at [facility], [address], [place], [place] [address]   Comprehensive metabolic panel     Status: Abnormal   Collection Time: 12/07/21  8:32 PM  Result Value Ref Range   Sodium 139 135 - 145 mmol/L   Potassium 4.1 3.5 - 5.1 mmol/L   Chloride 106 98 - 111 mmol/L   CO2 24 22 - 32 mmol/L   Glucose, Bld 135 (H) 70 - 99 mg/dL    Comment: Glucose reference range applies only to samples taken after fasting for at least 8 hours.   BUN 14 6 - 20 mg/dL   Creatinine, Ser 0.16 (H)  0.61 - 1.24 mg/dL   Calcium 8.5 (L) 8.9 - 10.3 mg/dL   Total Protein 6.9 6.5 - 8.1 g/dL   Albumin 3.4 (L) 3.5 - 5.0 g/dL   AST 33 15 - 41 U/L   ALT 34 0 - 44 U/L   Alkaline Phosphatase 143 (H) 38 - 126 U/L   Total Bilirubin 0.9 0.3 - 1.2 mg/dL   GFR, Estimated 58 (L) >60 mL/min    Comment: (NOTE) Calculated using the CKD-EPI Creatinine Equation (2021)    Anion gap 9 5 - 15    Comment: Performed at [facility], [address], [place], [place] [address]  Magnesium     Status: Abnormal   Collection Time: 12/07/21  8:32 PM  Result Value Ref Range   Magnesium 1.6 (L) 1.7 - 2.4 mg/dL    Comment: Performed at [facility], [address], [place], [place] [address]  Lipase, blood     Status: None   Collection Time: 12/07/21  8:32 PM  Result Value Ref Range   Lipase 26 11 - 51 U/L    Comment: Performed at [facility], [address], [place], [place] [address]  CBC with Differential/Platelet     Status: Abnormal   Collection Time: 12/07/21  8:32 PM  Result Value Ref Range   WBC 7.4 4.0 - 10.5 K/uL   RBC 4.42 4.22 - 5.81 MIL/uL   Hemoglobin 12.6 (L) 13.0 - 17.0 g/dL   HCT 54.2 70.6 - 23.7 %   MCV 88.2 80.0 - 100.0 fL   MCH 28.5 26.0 - 34.0 pg   MCHC 32.3 30.0 - 36.0 g/dL   RDW 62.8 31.5 - 17.6 %   Platelets 431 (H) 150 - 400 K/uL   nRBC 0.0 0.0 - 0.2 %   Neutrophils Relative % 58 %   Neutro Abs 4.3 1.7 - 7.7 K/uL   Lymphocytes Relative 27 %   Lymphs Abs 2.0 0.7 - 4.0 K/uL   Monocytes Relative 9 %   Monocytes Absolute 0.7 0.1 - 1.0 K/uL   Eosinophils Relative 5 %   Eosinophils Absolute 0.4 0.0 - 0.5 K/uL   Basophils Relative 1 %   Basophils Absolute 0.1 0.0 - 0.1 K/uL   Immature Granulocytes 0 %   Abs Immature Granulocytes 0.02 0.00 - 0.07 K/uL    Comment: Performed at [facility], [address], [place], [place] [address]  Troponin I (High Sensitivity)     Status: Abnormal   Collection Time: 12/07/21 10:29 PM  Result Value Ref Range   Troponin  I (High Sensitivity) 42 (H) <18 ng/L    Comment: RESULT CALLED TO, READ BACK BY AND VERIFIED WITH Oneita Hurt, RN, 2314 12/07/21,  A. RAMSEY DELTA CHECK NOTED (NOTE) Elevated high sensitivity troponin I (hsTnI) values and significant  changes across serial measurements may suggest ACS but many other  chronic and acute conditions are known to elevate hsTnI results.  Refer to the "Links" section for chest pain algorithms and additional  guidance. Performed at [facility], [address], [place], [place] [address]   CBC     Status: Abnormal   Collection Time: 12/08/21 12:55 AM  Result Value Ref Range   WBC 6.6 4.0 - 10.5 K/uL   RBC 3.83 (L) 4.22 - 5.81 MIL/uL   Hemoglobin 10.9 (L) 13.0 - 17.0 g/dL   HCT 32.9 (L) 92.4 - 26.8 %   MCV 87.2 80.0 - 100.0 fL   MCH 28.5 26.0 - 34.0 pg   MCHC 32.6 30.0 - 36.0 g/dL   RDW 34.1 96.2 - 22.9 %   Platelets 375 150 - 400 K/uL   nRBC 0.0 0.0 - 0.2 %    Comment: Performed at [facility], [address] [place], [place] [address]  Creatinine, serum     Status: None   Collection Time: 12/08/21 12:55 AM  Result Value Ref Range   Creatinine, Ser 1.21 0.61 - 1.24 mg/dL   GFR, Estimated >11 >94 mL/min    Comment: (NOTE) Calculated using the CKD-EPI Creatinine Equation (2021) Performed at [facility], [address], [place], [place] [address]   Troponin I (High Sensitivity)     Status: Abnormal   Collection Time: 12/08/21 12:55 AM  Result Value Ref Range   Troponin I (High Sensitivity) 18 (H) <18 ng/L    Comment: (NOTE) Elevated high sensitivity troponin I (hsTnI) values and significant  changes across serial measurements may suggest ACS but many other  chronic and acute conditions are known to elevate hsTnI results.  Refer to the "Links" section for chest pain algorithms and additional  guidance. Performed at [facility], [address], [place], [place] [address]   Basic metabolic panel     Status: Abnormal    Collection Time: 12/08/21  5:17 AM  Result Value Ref Range   Sodium 140 135 - 145 mmol/L   Potassium 4.7 3.5 - 5.1 mmol/L    Comment: HEMOLYSIS AT THIS LEVEL MAY AFFECT RESULT   Chloride 101 98 - 111 mmol/L   CO2 27 22 - 32 mmol/L   Glucose, Bld 165 (H) 70 - 99 mg/dL    Comment: Glucose reference range applies only to samples taken after fasting for at least 8 hours.   BUN 15 6 - 20 mg/dL   Creatinine, Ser 8.56 (H) 0.61 - 1.24 mg/dL   Calcium 8.8 (L) 8.9 - 10.3 mg/dL   GFR, Estimated 60 (L) >60 mL/min    Comment: (NOTE) Calculated using the CKD-EPI Creatinine Equation (2021)    Anion gap 12 5 - 15    Comment: Performed at [facility], [address], [place], [place] [address]  Magnesium     Status: None   Collection Time: 12/08/21  5:17 AM  Result Value Ref Range   Magnesium 2.0 1.7 - 2.4 mg/dL    Comment: Performed at [facility], [address], [place], [place] [address]  Troponin I (High Sensitivity)     Status: Abnormal   Collection Time: 12/08/21  5:17 AM  Result Value Ref Range   Troponin I (High Sensitivity) 18 (H) <18 ng/L    Comment: (NOTE) Elevated  high sensitivity troponin I (hsTnI) values and significant  changes across serial measurements may suggest ACS but many other  chronic and acute conditions are known to elevate hsTnI results.  Refer to the "Links" section for chest pain algorithms and additional  guidance. Performed at [facility], [place] [address], [place], [place] [address]    CT Head Wo Contrast  Result Date: 12/08/2021 CLINICAL DATA:  Trauma. EXAM: CT HEAD WITHOUT CONTRAST TECHNIQUE: Contiguous axial images were obtained from the base of the skull through the vertex without intravenous contrast. RADIATION DOSE REDUCTION: This exam was performed according to the departmental dose-optimization program which includes automated exposure control, adjustment of the mA and/or kV according to patient size and/or use of iterative  reconstruction technique. COMPARISON:  Head CT dated 03/12/2021. FINDINGS: Brain: Mild age-related atrophy and chronic microvascular ischemic changes. There is no acute intracranial hemorrhage. No mass effect or midline shift. No extra-axial fluid collection. Small calcified meningioma along the left tentorium measures approximately 6 mm. Vascular: No hyperdense vessel or unexpected calcification. Skull: Normal. Negative for fracture or focal lesion. Sinuses/Orbits: Diffuse mucoperiosteal thickening of paranasal sinuses with partial opacification of the right maxillary sinus. The mastoid air cells are clear. Other: None IMPRESSION: 1. No acute intracranial pathology. 2. Mild age-related atrophy and chronic microvascular ischemic changes. 3. Small calcified meningioma along the left tentorium. Electronically Signed   By: Anner Crete M.D.   On: 12/08/2021 03:20   CT Lumbar Spine Wo Contrast  Result Date: 12/08/2021 CLINICAL DATA:  Fall EXAM: CT LUMBAR SPINE WITHOUT CONTRAST TECHNIQUE: Multidetector CT imaging of the lumbar spine was performed without intravenous contrast administration. Multiplanar CT image reconstructions were also generated. RADIATION DOSE REDUCTION: This exam was performed according to the departmental dose-optimization program which includes automated exposure control, adjustment of the mA and/or kV according to patient size and/or use of iterative reconstruction technique. COMPARISON:  None Available. FINDINGS: Segmentation: 5 lumbar type vertebrae. Alignment: Normal. Vertebrae: No acute fracture or focal pathologic process. Paraspinal and other soft tissues: Calcific aortic atherosclerosis. Disc levels: No spinal canal stenosis. IMPRESSION: 1. No acute fracture or static subluxation of the lumbar spine. 2. Aortic Atherosclerosis (ICD10-I70.0). Electronically Signed   By: Ulyses Jarred M.D.   On: 12/08/2021 03:18   DG Chest 1 View  Result Date: 12/07/2021 CLINICAL DATA:  Fall with  chest pain EXAM: CHEST  1 VIEW COMPARISON:  11/10/2021 FINDINGS: Cardiomegaly. Central pulmonary arteries appear enlarged. No pleural effusion or pneumothorax. IMPRESSION: 1. Mild cardiomegaly. 2. Prominent central pulmonary arteries suggestive of arterial hypertension Electronically Signed   By: Donavan Foil M.D.   On: 12/07/2021 21:03   DG Lumbar Spine Complete  Result Date: 12/07/2021 CLINICAL DATA:  Fall EXAM: LUMBAR SPINE - COMPLETE 4+ VIEW COMPARISON:  CT 08/03/2021 FINDINGS: Alignment within normal limits. Vertebral body heights are maintained. The disc spaces are patent. Aortic atherosclerosis. IMPRESSION: No acute osseous abnormality Electronically Signed   By: Donavan Foil M.D.   On: 12/07/2021 21:03    Pending Labs Unresulted Labs (From admission, onward)     Start     Ordered   12/14/21 0500  Creatinine, serum  (enoxaparin (LOVENOX)    CrCl >/= 30 ml/min)  Weekly,   R     Comments: while on enoxaparin therapy    12/08/21 0002   12/08/21 [id number]  Basic metabolic panel  Daily at 5am,   R     Comments: As Scheduled for 5 days    12/08/21 0002  12/08/21 0500  Magnesium  Daily at 5am,   R      12/08/21 0002            Vitals/Pain Today's Vitals   12/08/21 0600 12/08/21 0859 12/08/21 0907 12/08/21 1000  BP: (!) 127/100 123/87 123/87 (!) 142/104  Pulse: 81 84 85 88  Resp: 17  19 18   Temp:   (!) 97.4 F (36.3 C)   TempSrc:   Oral   SpO2: 94%  98% 97%  Weight:      Height:      PainSc:        Isolation Precautions No active isolations  Medications Medications  hydrALAZINE (APRESOLINE) tablet 50 mg (50 mg Oral Given 12/08/21 0551)  carvedilol (COREG) tablet 6.25 mg (6.25 mg Oral Given 12/08/21 0859)  spironolactone (ALDACTONE) tablet 25 mg (25 mg Oral Given 12/08/21 0858)  lidocaine (LIDODERM) 5 % 1 patch (1 patch Transdermal Patch Applied 12/07/21 2334)  sodium chloride flush (NS) 0.9 % injection 3 mL (3 mLs Intravenous Given 12/08/21 0859)  sodium chloride flush  (NS) 0.9 % injection 3 mL (has no administration in time range)  0.9 %  sodium chloride infusion (has no administration in time range)  acetaminophen (TYLENOL) tablet 650 mg (650 mg Oral Patient Refused/Not Given 12/08/21 0553)  ondansetron (ZOFRAN) injection 4 mg (has no administration in time range)  enoxaparin (LOVENOX) injection 40 mg (40 mg Subcutaneous Patient Refused/Not Given 12/08/21 0857)  furosemide (LASIX) injection 80 mg (has no administration in time range)  aspirin EC tablet 81 mg (81 mg Oral Given 12/08/21 0903)  atorvastatin (LIPITOR) tablet 80 mg (80 mg Oral Given 12/08/21 0858)  isosorbide mononitrate (IMDUR) 24 hr tablet 30 mg (30 mg Oral Given 12/08/21 0859)  methocarbamol (ROBAXIN) tablet 500 mg (500 mg Oral Given 12/08/21 0858)  sacubitril-valsartan (ENTRESTO) 97-103 mg per tablet (1 tablet Oral Given 12/08/21 0858)  Tafamidis CAPS 61 mg (61 mg Oral Not Given 12/08/21 0853)  furosemide (LASIX) injection 40 mg (40 mg Intravenous Given 12/07/21 2111)  oxyCODONE-acetaminophen (PERCOCET/ROXICET) 5-325 MG per tablet 1 tablet (1 tablet Oral Given 12/07/21 2111)  nitroGLYCERIN (NITROGLYN) 2 % ointment 1 inch (1 inch Topical Given 12/07/21 2111)  magnesium sulfate IVPB 2 g 50 mL (0 g Intravenous Stopped 12/07/21 2307)  methocarbamol (ROBAXIN) tablet 500 mg (500 mg Oral Given 12/07/21 2334)  oxyCODONE-acetaminophen (PERCOCET/ROXICET) 5-325 MG per tablet 1 tablet (1 tablet Oral Given 12/07/21 2334)    Mobility walks Moderate fall risk   Focused Assessments    R Recommendations: See Admitting Provider Note  Report given to:   Additional Notes: AOx4, room air

## 2021-12-08 NOTE — Progress Notes (Addendum)
Advanced Heart Failure Rounding Note  PCP-Cardiologist: Jenkins Rouge, MD   Subjective:   - Echo (10/22): EF 20-25%, RV ok - R/LHC (10/22): Patent RPLV stent with nonobstructive disease elsewhere, Preserved CO/CI, RA mean 5 mmHg, PCWP mean 5 mmHg - cMRI (11/22): LVEF 24% RVEF 21% LGE and ECW suggestive of cardiac amyloidosis. 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 I thought equivocal but based on cMRI likely positive Will need genetic testing. - Multiple myeloma panel (12/22) with no m-spike. - ECHO (6/23) LVEF 30-35%, LV global hypokinesis, Grade 1 DD, RV function normal  Thomas West was seen yesterday in HF APP clinic and was fluid overloaded diuretic restarted, advised to go to the ED d/t chest pain. Decided to go home and had a mechanical fall. Noncompliant with meds at home because he ran out.   In the ED: EKG ST, BNP 3127, HsTrop 20>42>18>18, K 4.7, Mg 2. Cr. 1.37. Lasix given twice in ED with 1.3 L UOP. CXR: Cardiomegaly. Central pulmonary arteries appear enlarged. No pleural effusion or pneumothorax.   Warm and wet. JVD up to jaw. Denies CP, SOB, and Orthopnea.  Objective:   Weight Range: 72 kg Body mass index is 23.44 kg/m.   Vital Signs:   Temp:  [97.4 F (36.3 C)-98.9 F (37.2 C)] 97.4 F (36.3 C) (07/18 0907) Pulse Rate:  [66-104] 85 (07/18 0907) Resp:  [14-23] 19 (07/18 0907) BP: (123-186)/(87-131) 123/87 (07/18 0907) SpO2:  [94 %-100 %] 98 % (07/18 0907) Weight:  [72 kg] 72 kg (07/17 1940)    Weight change: Filed Weights   12/07/21 1940  Weight: 72 kg    Intake/Output:   Intake/Output Summary (Last 24 hours) at 12/08/2021 1112 Last data filed at 12/08/2021 0110 Gross per 24 hour  Intake --  Output 1300 ml  Net -1300 ml      Physical Exam    General:  chronically-ill appearing. No respiratory difficulty HEENT: normal Neck: supple. JVD to jaw. Carotids 2+ bilat; no bruits. No lymphadenopathy or thyromegaly appreciated. Cor: PMI nondisplaced.  Regular rate & rhythm. No rubs, gallops or murmurs. Lungs: clear, diminished at bases L>R Abdomen: soft, nontender, nondistended. No hepatosplenomegaly. No bruits or masses. Good bowel sounds. Extremities: no cyanosis, clubbing, rash, +2 BLE edema Neuro: alert & oriented x 3, cranial nerves grossly intact. moves all 4 extremities w/o difficulty. Affect pleasant.  Telemetry   NSR in 80's (Personally reviewed)    EKG  Sinus Tach, 104 bpm (Personally reviewed)   Labs    CBC Recent Labs    12/07/21 2032 12/08/21 0055  WBC 7.4 6.6  NEUTROABS 4.3  --   HGB 12.6* 10.9*  HCT 39.0 33.4*  MCV 88.2 87.2  PLT 431* 102   Basic Metabolic Panel Recent Labs    12/07/21 2032 12/08/21 0055 12/08/21 0517  NA 139  --  140  K 4.1  --  4.7  CL 106  --  101  CO2 24  --  27  GLUCOSE 135*  --  165*  BUN 14  --  15  CREATININE 1.40* 1.21 1.37*  CALCIUM 8.5*  --  8.8*  MG 1.6*  --  2.0   Liver Function Tests Recent Labs    12/07/21 2032  AST 33  ALT 34  ALKPHOS 143*  BILITOT 0.9  PROT 6.9  ALBUMIN 3.4*   Recent Labs    12/07/21 2032  LIPASE 26   Cardiac Enzymes No results for input(s): "CKTOTAL", "CKMB", "CKMBINDEX", "TROPONINI"  in the last 72 hours.  BNP: BNP (last 3 results) Recent Labs    10/05/21 0530 11/10/21 1824 12/07/21 1130  BNP 655.0* 319.8* 3,127.4*    ProBNP (last 3 results) Recent Labs    02/03/21 0911  PROBNP 3,012.0*     D-Dimer No results for input(s): "DDIMER" in the last 72 hours. Hemoglobin A1C No results for input(s): "HGBA1C" in the last 72 hours. Fasting Lipid Panel No results for input(s): "CHOL", "HDL", "LDLCALC", "TRIG", "CHOLHDL", "LDLDIRECT" in the last 72 hours. Thyroid Function Tests No results for input(s): "TSH", "T4TOTAL", "T3FREE", "THYROIDAB" in the last 72 hours.  Invalid input(s): "FREET3"  Other results:   Imaging    CT Head Wo Contrast  Result Date: 12/08/2021 CLINICAL DATA:  Trauma. EXAM: CT HEAD WITHOUT  CONTRAST TECHNIQUE: Contiguous axial images were obtained from the base of the skull through the vertex without intravenous contrast. RADIATION DOSE REDUCTION: This exam was performed according to the departmental dose-optimization program which includes automated exposure control, adjustment of the mA and/or kV according to patient size and/or use of iterative reconstruction technique. COMPARISON:  Head CT dated 03/12/2021. FINDINGS: Brain: Mild age-related atrophy and chronic microvascular ischemic changes. There is no acute intracranial hemorrhage. No mass effect or midline shift. No extra-axial fluid collection. Small calcified meningioma along the left tentorium measures approximately 6 mm. Vascular: No hyperdense vessel or unexpected calcification. Skull: Normal. Negative for fracture or focal lesion. Sinuses/Orbits: Diffuse mucoperiosteal thickening of paranasal sinuses with partial opacification of the right maxillary sinus. The mastoid air cells are clear. Other: None IMPRESSION: 1. No acute intracranial pathology. 2. Mild age-related atrophy and chronic microvascular ischemic changes. 3. Small calcified meningioma along the left tentorium. Electronically Signed   By: Anner Crete M.D.   On: 12/08/2021 03:20   CT Lumbar Spine Wo Contrast  Result Date: 12/08/2021 CLINICAL DATA:  Fall EXAM: CT LUMBAR SPINE WITHOUT CONTRAST TECHNIQUE: Multidetector CT imaging of the lumbar spine was performed without intravenous contrast administration. Multiplanar CT image reconstructions were also generated. RADIATION DOSE REDUCTION: This exam was performed according to the departmental dose-optimization program which includes automated exposure control, adjustment of the mA and/or kV according to patient size and/or use of iterative reconstruction technique. COMPARISON:  None Available. FINDINGS: Segmentation: 5 lumbar type vertebrae. Alignment: Normal. Vertebrae: No acute fracture or focal pathologic process.  Paraspinal and other soft tissues: Calcific aortic atherosclerosis. Disc levels: No spinal canal stenosis. IMPRESSION: 1. No acute fracture or static subluxation of the lumbar spine. 2. Aortic Atherosclerosis (ICD10-I70.0). Electronically Signed   By: Ulyses Jarred M.D.   On: 12/08/2021 03:18   DG Chest 1 View  Result Date: 12/07/2021 CLINICAL DATA:  Fall with chest pain EXAM: CHEST  1 VIEW COMPARISON:  11/10/2021 FINDINGS: Cardiomegaly. Central pulmonary arteries appear enlarged. No pleural effusion or pneumothorax. IMPRESSION: 1. Mild cardiomegaly. 2. Prominent central pulmonary arteries suggestive of arterial hypertension Electronically Signed   By: Donavan Foil M.D.   On: 12/07/2021 21:03   DG Lumbar Spine Complete  Result Date: 12/07/2021 CLINICAL DATA:  Fall EXAM: LUMBAR SPINE - COMPLETE 4+ VIEW COMPARISON:  CT 08/03/2021 FINDINGS: Alignment within normal limits. Vertebral body heights are maintained. The disc spaces are patent. Aortic atherosclerosis. IMPRESSION: No acute osseous abnormality Electronically Signed   By: Donavan Foil M.D.   On: 12/07/2021 21:03     Medications:     Scheduled Medications:  aspirin EC  81 mg Oral Daily   atorvastatin  80 mg Oral Daily  carvedilol  6.25 mg Oral BID WC   enoxaparin (LOVENOX) injection  40 mg Subcutaneous Q24H   [START ON 12/09/2021] furosemide  80 mg Intravenous BID   hydrALAZINE  50 mg Oral Q8H   isosorbide mononitrate  30 mg Oral Daily   lidocaine  1 patch Transdermal Q24H   methocarbamol  500 mg Oral BID   sacubitril-valsartan  1 tablet Oral BID   sodium chloride flush  3 mL Intravenous Q12H   spironolactone  25 mg Oral Daily   Tafamidis  61 mg Oral Daily    Infusions:  sodium chloride      PRN Medications: sodium chloride, acetaminophen, ondansetron (ZOFRAN) IV, sodium chloride flush    Patient Profile   Thomas West is a 61 y.o. male with history of CAD with prior MI and stent in 01/2019 at [facility] in [place]  (report not available), cardiomyopathy/chronic systolic HF, uncontrolled DM II, HTN, hx CVA, CKD. Admitted yesterday, was seen in HF APP clinic and was advised to go to ED but went home and had a fall. Here with A/C Systolic HF.    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 He reports cardiomyopathy diagnosed just prior to MI while living in [place] a couple of years ago. - Echo (10/22): EF 20-25%, RV ok - R/LHC (10/22): Patent RPLV stent with nonobstructive disease elsewhere, Preserved CO/CI, RA mean 5 mmHg, PCWP mean 5 mmHg - cMRI (11/22): LVEF 24% RVEF 21% LGE and ECW suggestive of cardiac amyloidosis. 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 I thought equivocal but based on cMRI likely positive Will need genetic testing. - Multiple myeloma panel (12/22) with no m-spike. - ECHO (6/23) LVEF 30-35%, LV global hypokinesis, Grade 1 DD, RV function normal - Presented with NYHA III, physically limited by neuropathy in feet. Volume up on exam, REDs 37% yesterday. On admission: BNP 3127, HsTrop 20>42>18>18. Acute exacerbation likely secondary to noncompliance with meds.  - Continue 70m lasix BID - Continue hydralazine 50 mg tid and Imdur 30 mg daily. Consider increasing dose after diuresis - Continue Entresto 97/103 mg bid. - Continue carvedilol 6.25 mg bid.  - Continue spironolactone 25 mg daily.  - No SGLT2i w/ A1c 12. - Await genetic testing.   - Continue tafamadis, and will likely benefit from AHurleyas well - Daily BMET. K 4.8, Cr 1.37, Mg 2 today - Daily weights, strict I&O   2. CAD - Chest pain on admission, now denies CP. HsTrop now flat HsTrop 20>42>18>18. Likely secondary to fluid overload d/t medication noncompliance.   [place]  [facility] Also had 20% distal RCA, 30% p LAD, 70% d LAD, 60% OM2 - Continue ASA  - Stent placed 2020. >145yrince stent placement, could  consider stopping Plavix d/t issues with noncompliance. - Continue atorvastatin 80 mg daily.   3. HTN - Elevated. - Diurese as above. - Increase hydralizine/Imdur after better diuresed   4. Uncontrolled DM - Needs PCP follow up. - On Januvia. - No SGLT2i with A1c 12.   5. PVCs - PVC's 0-1 per hr (Personally reviewed)  - Denies palpitations. - ECG ok today.   6. Neuropathy - This seems to be his biggest limiter. - On gabapentin. - Referred to Dr. [facility]. Consider Amvuttra.   7. SDOH - He has Medicaid and disability. - Transportation is an issue, he  has a car but unable to drive (needs license). - 85 year old son helps with meds. - Paramedicine to follow outpatient for medication compliance.  8. Mechanical fall - X-ray of lumbars ruled out fracture - PT/OT to follow - per primary   Length of Stay: [place], [place]  12/08/2021, 11:12 AM  Advanced Heart Failure Team Pager [phone] (M-F; 7a - 5p)  Please contact [facility] for night-coverage after hours (5p -7a ) and weekends on [email or web address]   Patient seen and examined with the above-signed Advanced Practice Provider and/or Housestaff. I personally reviewed laboratory data, imaging studies and relevant notes. I independently examined the patient and formulated the important aspects of the plan. I have edited the note to reflect any of my changes or salient points. I have personally discussed the plan with the patient and/or family.  Says he ran out of lasix 3-4 weeks ago. Now diuresing well with IV lasix. Breathing better but not back to baseline. Denies CP, orthopnea or PND.   General:  Lying in bed . No resp difficulty HEENT: normal Neck: supple. JVP 10  Carotids 2+ bilat; no bruits. No lymphadenopathy or thryomegaly appreciated. Cor: PMI nondisplaced. Regular rate & rhythm. No rubs, gallops or murmurs. Lungs: clear Abdomen: soft, nontender, nondistended. No hepatosplenomegaly. No bruits or  masses. Good bowel sounds. Extremities: no cyanosis, clubbing, rash, tr edema Neuro: alert & orientedx3, cranial nerves grossly intact. moves all 4 extremities w/o difficulty. Affect pleasant  He is moderately volume overloaded. Responding well to IV lasix. Hopefully will be ready for d/c in 1-2 days.   Glori Bickers, MD  5:06 PM

## 2021-12-08 NOTE — Plan of Care (Signed)
  Problem: Activity: Goal: Risk for activity intolerance will decrease Outcome: Progressing   Problem: Coping: Goal: Level of anxiety will decrease Outcome: Progressing   Problem: Pain Managment: Goal: General experience of comfort will improve Outcome: Progressing   

## 2021-12-08 NOTE — Telephone Encounter (Signed)
   Telephone encounter was:  Successful.  12/08/2021 Name: Thomas West MRN: [id number] DOB: 1960-12-21  Thomas West is a 61 y.o. year old male who is a primary care patient of [name], Thomas Expose, MD . The community resource team was consulted for assistance with Transportation Needs   Care guide performed the following interventions: Patient provided with information about care guide support team and interviewed to confirm resource needs.Can not leave a message no voicemail set up [facility] number she will give him a message to call me back. Patient was able to get a ride to his appointment today.  Follow Up Plan:  Care guide will follow up with patient by phone over the next day    [facility] Guide, Embedded Care Coordination [facility], Care Management  [phone] [address] [place], [place], [place] [address] Phone: [phone] Email: [email or web address]

## 2021-12-08 NOTE — Evaluation (Signed)
Physical Therapy Evaluation Patient Details Name: Thomas West MRN: [id number] DOB: 1961/05/01 Today's Date: 12/08/2021  History of Present Illness  61 yo male with onset of fall and chest pain was admitted 7/17, noted his volume of fluids was overloaded and in CHF.  Has been home with his son who is well able to assist with school being out currently.  Pt has been off lasix, had delayed coming to ED.  PMHx:  CHF, amylodiosis, CAD, NSTEMI, HTN, PN, CKD2  Clinical Impression  Pt was seen for mobility on RW today due to his ongoing issues of PN along with esp hip strength changes.  Pt is home with son, who is out of school currently and able to help him.  Recommend pt work on endurance of gait and stairs, to increase LE strength and funnel these gains into safer and more independent balance.  Pt has had falls that in pt's estimation were from trying to do things he really could not as time has passed, so recommending HHPT for follow up of all mobility and strength, and may request RW if pt is not able to safely transition to Rollator by DC.        Recommendations for follow up therapy are one component of a multi-disciplinary discharge planning process, led by the attending physician.  Recommendations may be updated based on patient status, additional functional criteria and insurance authorization.  Follow Up Recommendations Home health PT      Assistance Recommended at Discharge Intermittent Supervision/Assistance  Patient can return home with the following  A little help with walking and/or transfers;A little help with bathing/dressing/bathroom;Assistance with cooking/housework;Assist for transportation;Help with stairs or ramp for entrance    Equipment Recommendations None recommended by PT  Recommendations for Other Services       Functional Status Assessment Patient has had a recent decline in their functional status and demonstrates the ability to make significant improvements in  function in a reasonable and predictable amount of time.     Precautions / Restrictions Precautions Precautions: Fall Restrictions Weight Bearing Restrictions: No      Mobility  Bed Mobility Overal bed mobility: Needs Assistance Bed Mobility: Sidelying to Sit, Sit to Sidelying   Sidelying to sit: Min assist     Sit to sidelying: Min assist General bed mobility comments: pt is able to get up from gurney with minor help but is awkwardly postured to start    Transfers Overall transfer level: Needs assistance Equipment used: Rolling walker (2 wheels) Transfers: Sit to/from Stand Sit to Stand: Min guard           General transfer comment: min guard for safety of lines    Ambulation/Gait Ambulation/Gait assistance: Min guard Gait Distance (Feet): 140 Feet Assistive device: Rolling walker (2 wheels), 1 person hand held assist Gait Pattern/deviations: Step-through pattern, Decreased stride length Gait velocity: reduced Gait velocity interpretation: <1.31 ft/sec, indicative of household ambulator Pre-gait activities: standign balance ck General Gait Details: pt was able to support his efforts with RW and gradually used walker with greater UE pressure through the walk, careful with turns  Information systems manager [place] (Stroke Patients Only)       Balance Overall balance assessment: Needs assistance, History of Falls Sitting-balance support: Feet supported Sitting balance-Leahy Scale: Good     Standing balance support: Bilateral upper extremity supported, During functional activity Standing balance-Leahy Scale: Fair Standing balance comment: less than  fair with dynamic standing balance and fatigue                             Pertinent Vitals/Pain Pain Assessment Pain Assessment: No/denies pain    Home Living Family/patient expects to be discharged to:: Private residence Living Arrangements: Children Available  Help at Discharge: Family;Available PRN/intermittently Type of Home: House Home Access: Stairs to enter Entrance Stairs-Rails: None Entrance Stairs-Number of Steps: 1   Home Layout: Two level;Bed/bath upstairs Home Equipment: Rollator (4 wheels);BSC/3in1      Prior Function Prior Level of Function : Needs assist       Physical Assist : Mobility (physical) Mobility (physical): Stairs   Mobility Comments: son assists on stairs       Hand Dominance   Dominant Hand: Right    Extremity/Trunk Assessment   Upper Extremity Assessment Upper Extremity Assessment: Overall WFL for tasks assessed    Lower Extremity Assessment Lower Extremity Assessment: Generalized weakness    Cervical / Trunk Assessment Cervical / Trunk Assessment: Normal  Communication   Communication: No difficulties  Cognition Arousal/Alertness: Awake/alert Behavior During Therapy: WFL for tasks assessed/performed Overall Cognitive Status: Within Functional Limits for tasks assessed                                          General Comments General comments (skin integrity, edema, etc.): pt is reluctant to count on the walker initially but demonstrates good awareness of what it offers him    Exercises     Assessment/Plan    PT Assessment Patient needs continued PT services  PT Problem List Decreased strength;Decreased balance;Cardiopulmonary status limiting activity       PT Treatment Interventions DME instruction;Gait training;Stair training;Functional mobility training;Therapeutic activities;Therapeutic exercise;Neuromuscular re-education;Balance training;Patient/family education    PT Goals (Current goals can be found in the Care Plan section)  Acute Rehab PT Goals Patient Stated Goal: to get home and recover his strength PT Goal Formulation: With patient Time For Goal Achievement: 12/22/21 Potential to Achieve Goals: Good    Frequency Min 3X/week     Co-evaluation                AM-PAC PT "6 Clicks" Mobility  Outcome Measure Help needed turning from your back to your side while in a flat bed without using bedrails?: A Little Help needed moving from lying on your back to sitting on the side of a flat bed without using bedrails?: A Little Help needed moving to and from a bed to a chair (including a wheelchair)?: A Little Help needed standing up from a chair using your arms (e.g., wheelchair or bedside chair)?: A Little Help needed to walk in hospital room?: A Little Help needed climbing 3-5 steps with a railing? : A Little 6 Click Score: 18    End of Session Equipment Utilized During Treatment: Gait belt Activity Tolerance: Patient tolerated treatment well Patient left: in bed;with call bell/phone within reach Nurse Communication: Mobility status PT Visit Diagnosis: Other abnormalities of gait and mobility (R26.89);Difficulty in walking, not elsewhere classified (R26.2)    Time: 5732-2025 PT Time Calculation (min) (ACUTE ONLY): 18 min   Charges:   PT Evaluation $PT Eval Moderate Complexity: 1 Mod         Ivar Drape 12/08/2021, 5:21 PM  Samul Dada, PT PhD Acute Rehab Dept. Number:  [place] [phone] and [place]

## 2021-12-08 NOTE — H&P (Signed)
Cardiology Admission History and Physical:   Patient ID: Thomas West MRN: [id number]; DOB: 1960-07-08   Admission date: 12/07/2021  PCP:  [name], [name], MD   [facility] Providers Cardiologist:  Charlton Haws, MD        Chief Complaint:  chest pain, shortness of breath, fall  Patient Profile:   Thomas West is a 61 y.o. 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  History of Present Illness:   Mr. Boldman follows with Dr Bensihmon's clinic, was seen by Prince Rome 7/17 and found to be volume overloaded. Reported several hours of left-sided non-exertional new chest pressure and advised to present to the ED, which he declined to do at that time. Had ran out of lasix about a month earlier, restarted on lasix and other home meds (some of which he ran out of as well, he is unsure of which). Then had fall down stairs onto low back after losing balance due to neuropathy and due to this pain presneted to the ED. Still with mild improving CP Workup notable for BNP 3127 (last 320), trop 20 -> 42, stable lytes and renal fxn, mag 1.6. EKG stable antlat TWIs Last TTE in June with LVEF 20-25%, eccentric hypertrophy, no valvular disease. Prior PYP and CMR c/w ATTR amyloid. LHC last year with moderate nonobstructive CAD   Past Medical History:  Diagnosis Date   CHF (congestive heart failure) (HCC)    Coronary artery disease    Diabetes mellitus without complication (HCC)    History of MI (myocardial infarction) 03/05/2019   Hypertension    Stroke [facility])     Past Surgical History:  Procedure Laterality Date   leg surgery     "pins in left shin"   RIGHT/LEFT HEART CATH AND CORONARY ANGIOGRAPHY N/A 03/11/2021   Procedure: RIGHT/LEFT HEART CATH AND CORONARY ANGIOGRAPHY;  Surgeon: Orbie Pyo, MD;  Location: [facility] INVASIVE CV LAB;  Service: Cardiovascular;  Laterality: N/A;     Medications Prior to  Admission: Prior to Admission medications   Medication Sig Start Date End Date Taking? Authorizing Provider  amoxicillin-clavulanate (AUGMENTIN) 875-125 MG tablet Take 1 tablet by mouth every 12 (twelve) hours. 11/10/21   Charlynne Pander, MD  aspirin 81 MG EC tablet Take 1 tablet (81 mg total) by mouth daily. 07/15/21 07/10/22  Darlin Drop, DO  atorvastatin (LIPITOR) 80 MG tablet Take 1 tablet (80 mg total) by mouth daily. 07/15/21 12/07/21  Darlin Drop, DO  carvedilol (COREG) 6.25 MG tablet Take 1 tablet (6.25 mg total) by mouth EVERY 12 HOURS (10AM &10PM) 07/15/21 12/07/21  Darlin Drop, DO  furosemide (LASIX) 40 MG tablet Take 1 tablet (40 mg total) by mouth daily. 12/07/21 06/05/22  Jacklynn Ganong, FNP  hydrALAZINE (APRESOLINE) 50 MG tablet Take 1 tablet (50 mg total) by mouth 3 (three) times daily. 07/15/21 12/07/21  Darlin Drop, DO  hydrocortisone cream 1 % Apply to affected area 2 times daily Patient not taking: Reported on 12/07/2021 11/04/21   Bensimhon, Bevelyn Buckles, MD  hydrOXYzine (ATARAX) 25 MG tablet Take 1 tablet (25 mg total) by mouth every 6 (six) hours. 09/20/21   Jari Sportsman, MD  isosorbide mononitrate (IMDUR) 30 MG 24 hr tablet Take 1 tablet (30 mg total) by mouth daily. 09/02/21   Bensimhon, Bevelyn Buckles, MD  methocarbamol (ROBAXIN) 500 MG tablet Take 1 tablet (500 mg total) by  mouth 2 (two) times daily. 08/18/21   Prosperi, Christian H, PA-C  potassium chloride (KLOR-CON M) 10 MEQ tablet Take 1 tablet (10 mEq total) by mouth daily. 07/15/21 12/07/21  Darlin Drop, DO  sacubitril-valsartan (ENTRESTO) 97-103 MG Take 1 tablet by mouth 2 (two) times daily. 09/02/21   Bensimhon, Bevelyn Buckles, MD  sitaGLIPtin (JANUVIA) 25 MG tablet Take 1 tablet (25 mg total) by mouth daily. 07/15/21 12/07/21  Darlin Drop, DO  spironolactone (ALDACTONE) 25 MG tablet Take 1 tablet (25 mg total) by mouth daily. 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  09/02/21   Bensimhon, Bevelyn Buckles, MD     Allergies:    Allergies  Allergen Reactions   Bee Venom     swelling    Social History:   Social History   Socioeconomic History   Marital status: Single    Spouse name: Not on file   Number of children: Not on file   Years of education: Not on file   Highest education level: Not on file  Occupational History   Not on file  Tobacco Use   Smoking status: Former    Types: Cigarettes    Quit date: 10/06/2017    Years since quitting: 4.1   Smokeless tobacco: Never  Substance and Sexual Activity   Alcohol use: Not Currently   Drug use: Yes    Types: Marijuana   Sexual activity: Not on file  Other Topics Concern   Not on file  Social History Narrative   Not on file   Social Determinants of Health   Financial Resource Strain: Low Risk  (03/10/2021)   Overall Financial Resource Strain (CARDIA)    Difficulty of Paying Living Expenses: Not very hard  Food Insecurity: Food Insecurity Present (12/07/2021)   Hunger Vital Sign    Worried About Running Out of Food in the Last Year: Sometimes true    Ran Out of Food in the Last Year: Never true  Transportation Needs: Unmet Transportation Needs (08/25/2021)   PRAPARE - Administrator, Civil Service (Medical): Yes    Lack of Transportation (Non-Medical): Yes  Physical Activity: Not on file  Stress: Not on file  Social Connections: Not on file  Intimate Partner Violence: Not on file    Family History:   The patient's family history is not on file.    ROS:  Please see the history of present illness.  All other ROS reviewed and negative.     Physical Exam/Data:   Vitals:   12/07/21 1940 12/07/21 2118 12/07/21 2300 12/07/21 2339  BP:  (!) 155/131 (!) 161/125 (!) 157/116  Pulse:  (!) 104 95 98  Resp:  18 18   Temp:  98.7 F (37.1 C) 98.4 F (36.9 C)   TempSrc:  Oral Oral   SpO2:  100% 98%   Weight: 72 kg     Height: 5\' 9"  (1.753 m)       Intake/Output Summary (Last 24  hours) at 12/08/2021 0027 Last data filed at 12/07/2021 2230 Gross per 24 hour  Intake --  Output 600 ml  Net -600 ml      12/07/2021    7:40 PM 12/07/2021   10:47 AM 10/05/2021    1:49 AM  Last 3 Weights  Weight (lbs) 158 lb 11.7 oz 159 lb 3.2 oz 155 lb  Weight (kg) 72 kg 72.213 kg 70.308 kg  Body mass index is 23.44 kg/m.  General:  Well nourished, well developed, in no acute distress HEENT: normal Neck:  JVD 10 cm at 30 degres, EJ is engorged Vascular: No carotid bruits; Distal pulses 2+ bilaterally   Cardiac:  normal S1, S2; RRR; no murmur  Lungs:  clear to auscultation bilaterally, no wheezing, rhonchi or rales  Abd: soft, nontender, no hepatomegaly  Ext: 1+ edema Musculoskeletal:  No deformities, BUE and BLE strength normal and equal Skin: warm and dry  Neuro:  CNs 2-12 intact, no focal abnormalities noted Psych:  Normal affect    Laboratory Data:  High Sensitivity Troponin:   Recent Labs  Lab 11/10/21 1124 11/10/21 1824 12/07/21 2032 12/07/21 2229  TROPONINIHS 16 19* 20* 42*      Chemistry Recent Labs  Lab 12/07/21 1130 12/07/21 2032  NA 140 139  K 4.0 4.1  CL 105 106  CO2 24 24  GLUCOSE 168* 135*  BUN 13 14  CREATININE 1.26* 1.40*  CALCIUM 8.7* 8.5*  MG  --  1.6*  GFRNONAA >60 58*  ANIONGAP 11 9    Recent Labs  Lab 12/07/21 2032  PROT 6.9  ALBUMIN 3.4*  AST 33  ALT 34  ALKPHOS 143*  BILITOT 0.9   Lipids No results for input(s): "CHOL", "TRIG", "HDL", "LABVLDL", "LDLCALC", "CHOLHDL" in the last 168 hours. Hematology Recent Labs  Lab 12/07/21 2032  WBC 7.4  RBC 4.42  HGB 12.6*  HCT 39.0  MCV 88.2  MCH 28.5  MCHC 32.3  RDW 15.5  PLT 431*   Thyroid No results for input(s): "TSH", "FREET4" in the last 168 hours. BNP Recent Labs  Lab 12/07/21 1130  BNP 3,127.4*    DDimer No results for input(s): "DDIMER" in the last 168 hours.   Radiology/Studies:  DG Chest 1 View  Result Date: 12/07/2021 CLINICAL DATA:  Fall with chest  pain EXAM: CHEST  1 VIEW COMPARISON:  11/10/2021 FINDINGS: Cardiomegaly. Central pulmonary arteries appear enlarged. No pleural effusion or pneumothorax. IMPRESSION: 1. Mild cardiomegaly. 2. Prominent central pulmonary arteries suggestive of arterial hypertension Electronically Signed   By: Jasmine Pang M.D.   On: 12/07/2021 21:03   DG Lumbar Spine Complete  Result Date: 12/07/2021 CLINICAL DATA:  Fall EXAM: LUMBAR SPINE - COMPLETE 4+ VIEW COMPARISON:  CT 08/03/2021 FINDINGS: Alignment within normal limits. Vertebral body heights are maintained. The disc spaces are patent. Aortic atherosclerosis. IMPRESSION: No acute osseous abnormality Electronically Signed   By: Jasmine Pang M.D.   On: 12/07/2021 21:03     Assessment and Plan:   HFrEF, decompensated, NYHA III/C, wet and warm: Diuresis with lasix 80 IV Bid to start with and escalate as needed. Resume home meds- entresto, hydral, imdur, coreg, aldactone, tafamidis. HF protocol Chest pain, CAD: Overall low-moderate suspicion of ACS given low level troponin and stable EKG, but cannot exclude ACS. Instead, suspect symptoms related to HF. Continue to monitor and trend troponins to peak. No need for therapeutic anticoagulation at this time. Continue aspirin and statin Fall: PT/Ot. X-ray of lumbars ruled out fracture   Risk Assessment/Risk Scores:    HEAR Score (for undifferentiated chest pain):     New York Heart Association (NYHA) Functional Class NYHA Class III     Severity of Illness: The appropriate patient status for this patient is INPATIENT. Inpatient status is judged to be reasonable and necessary in order to provide the required intensity of service to ensure the patient's safety. The patient's presenting symptoms, physical exam findings,  and initial radiographic and laboratory data in the context of their chronic comorbidities is felt to place them at high risk for further clinical deterioration. Furthermore, it is not anticipated that  the patient will be medically stable for discharge from the hospital within 2 midnights of admission.   * I certify that at the point of admission it is my clinical judgment that the patient will require inpatient hospital care spanning beyond 2 midnights from the point of admission due to high intensity of service, high risk for further deterioration and high frequency of surveillance required.*   For questions or updates, please contact [facility] Please consult [email or web address] for contact info under     Signed, [name] Delaine Canter, MD  12/08/2021 12:27 AM

## 2021-12-08 NOTE — ED Notes (Signed)
Patient transported to CT 

## 2021-12-08 NOTE — Telephone Encounter (Signed)
Unsuccessful attempt to reach patient on preferred number listed In notes for scheduled AWV. Patient returned call to cancel visit due to hospitalization.

## 2021-12-08 NOTE — ED Notes (Signed)
Breakfast order placed ?

## 2021-12-08 NOTE — TOC Initial Note (Addendum)
Transition of Care [facility]) - Initial/Assessment Note    Patient Details  Name: Thomas West MRN: [id number] Date of Birth: 04-Apr-1961  [facility]) CM/SW Contact:    Elliot Cousin, RN Phone Number: [phone] 12/08/2021, 4:42 PM  Clinical Narrative:                 HF TOC CM spoke to pt at bedside. Lives in home with minor child. States he drives to his appt. Has RW with seat at home and scale. Will request meds come up from [facility] pharmacy at dc. Pt requesting tub bench, explained pt insurance will not cover.   Schedule appt with PCP, Dr [name] at 8/1 at 11. Rescheduled appt with Dr Marjory Lies for 8/7 at 3 pm per pt request.   PT recommending HHPT due to fall at home. Will arrange HHPT.   Expected Discharge Plan: Home/Self Care Barriers to Discharge: Continued Medical Work up   Patient Goals and CMS Choice Patient states their goals for this hospitalization and ongoing recovery are:: wants to get better      Expected Discharge Plan and Services Expected Discharge Plan: Home/Self Care   Discharge Planning Services: CM Consult   Living arrangements for the past 2 months: Apartment     Prior Living Arrangements/Services Living arrangements for the past 2 months: Apartment Lives with:: Minor Children Patient language and need for interpreter reviewed:: Yes Do you feel safe going back to the place where you live?: Yes      Need for Family Participation in Patient Care: No (Comment) Care giver support system in place?: No (comment) Current home services: DME (rollator) Criminal Activity/Legal Involvement Pertinent to Current Situation/Hospitalization: No - Comment as needed  Activities of Daily Living      Permission Sought/Granted Permission sought to share information with : Case Manager, Family Supports, PCP Permission granted to share information with : Yes, Verbal Permission Granted  Share Information with NAME: Kym Groom     Permission granted  to share info w Relationship: sister  Permission granted to share info w Contact Information: [phone]  Emotional Assessment Appearance:: Appears stated age Attitude/Demeanor/Rapport: Engaged Affect (typically observed): Accepting Orientation: : Oriented to Self, Oriented to Place, Oriented to  Time, Oriented to Situation   Psych Involvement: No (comment)  Admission diagnosis:  Heart failure (HCC) [I50.9] Fall, initial encounter [W19.XXXA] Acute midline low back pain without sciatica [M54.50] Chest pain, unspecified type [R07.9] Patient Active Problem List   Diagnosis Date Noted   Heart failure (HCC) 12/08/2021   Hypertension associated with diabetes (HCC) 07/12/2021   Chronic kidney disease, stage 3a (HCC) 07/12/2021   Hyperkalemia 07/12/2021   CHF exacerbation (HCC) 04/20/2021   Acute on chronic combined systolic and diastolic CHF (congestive heart failure) (HCC) 03/09/2021   Acute on chronic HFrEF (heart failure with reduced ejection fraction) (HCC) 03/08/2021   HFrEF (heart failure with reduced ejection fraction) (HCC) 02/03/2021   Peripheral neuropathy 02/03/2021   Diabetes mellitus (HCC) 03/27/2019   Hyperlipidemia associated with type 2 diabetes mellitus (HCC) 03/27/2019   CAD (coronary artery disease) 03/05/2019   History of MI (myocardial infarction) 03/05/2019   Type 2 diabetes mellitus with diabetic neuropathy, unspecified (HCC) 03/05/2019   CKD (chronic kidney disease), stage II 03/05/2019   HLD (hyperlipidemia) 03/05/2019   GERD (gastroesophageal reflux disease) 03/05/2019   Hypertension, essential, benign 03/05/2019   PCP:  [name], [name], MD Pharmacy:   [facility] [place], [place] - [address] [place] [address] Phone: [phone] Fax: [phone]  [id number] [place], [place] - [address] AT [facility] OF [facility] & [address] [place]  [address] Phone: [phone] Fax: [phone]     Social Determinants of Health (SDOH) Interventions    Readmission Risk Interventions    07/14/2021    4:34 PM  Readmission Risk Prevention Plan  Transportation Screening Complete  HRI or Home Care Consult Complete  Social Work Consult for Recovery Care Planning/Counseling Complete  Palliative Care Screening Not Applicable  Medication Review Oceanographer) Complete

## 2021-12-08 NOTE — Progress Notes (Signed)
Heart Failure Navigator Progress Note ? ?Assessed for Heart & Vascular TOC clinic readiness.  ?Patient does not meet criteria due to Advanced Heart Failure Team patient. .  ? ? ? ?Erland Vivas, BSN, RN ?Heart Failure Nurse Navigator ?Secure Chat Only   ?

## 2021-12-08 NOTE — Progress Notes (Signed)
Patient requests medication for constipation and pain. Provider notified.

## 2021-12-09 ENCOUNTER — Ambulatory Visit: Payer: Medicare Other | Admitting: Diagnostic Neuroimaging

## 2021-12-09 DIAGNOSIS — I5023 Acute on chronic systolic (congestive) heart failure: Secondary | ICD-10-CM | POA: Diagnosis not present

## 2021-12-09 LAB — MAGNESIUM: Magnesium: 1.9 mg/dL (ref 1.7–2.4)

## 2021-12-09 LAB — BASIC METABOLIC PANEL
Anion gap: 9 (ref 5–15)
BUN: 17 mg/dL (ref 6–20)
CO2: 28 mmol/L (ref 22–32)
Calcium: 8.4 mg/dL — ABNORMAL LOW (ref 8.9–10.3)
Chloride: 103 mmol/L (ref 98–111)
Creatinine, Ser: 1.35 mg/dL — ABNORMAL HIGH (ref 0.61–1.24)
GFR, Estimated: >60 mL/min (ref >60)
Glucose, Bld: 145 mg/dL — ABNORMAL HIGH (ref 70–99)
Potassium: 4.1 mmol/L (ref 3.5–5.1)
Sodium: 140 mmol/L (ref 135–145)

## 2021-12-09 MED ORDER — POTASSIUM CHLORIDE CRYS ER 20 MEQ PO TBCR
40.0000 meq | EXTENDED_RELEASE_TABLET | Freq: Once | ORAL | Status: AC
  Administered 2021-12-09: 40 meq via ORAL
  Filled 2021-12-09: qty 2

## 2021-12-09 MED ORDER — METOLAZONE 2.5 MG PO TABS
2.5000 mg | ORAL_TABLET | Freq: Once | ORAL | Status: AC
Start: 2021-12-09 — End: 2021-12-09
  Administered 2021-12-09: 2.5 mg via ORAL
  Filled 2021-12-09: qty 1

## 2021-12-09 MED ORDER — TRAMADOL HCL 50 MG PO TABS
50.0000 mg | ORAL_TABLET | Freq: Four times a day (QID) | ORAL | Status: DC | PRN
  Administered 2021-12-09 (×2): 50 mg via ORAL
  Filled 2021-12-09 (×2): qty 1

## 2021-12-09 NOTE — Progress Notes (Addendum)
Pt still complaining pain 10/10. Despite of, PRN tramadol, Tylenol given together with Robaxin and Lidocaine patch. Pt Vomited as well after 1 hr of taking those meds. Pt has mentioned that Percocet relieves his pain. And tramadol does not work.   Nauseous med given as well. Will monitor.  On call Cardiologist, DR Mackie Pai was Paged. -Dilaudid IV 1 mg was ordered. However, pt was so adamant not to take any pain meds Except percocet.  1tab percocet was ordered.

## 2021-12-09 NOTE — Progress Notes (Addendum)
Advanced Heart Failure Rounding Note  PCP-Cardiologist: Jenkins Rouge, MD   Subjective:    Yesterday diuresed IV lasix. Weight unchanged.   Complaining of back pain.   Objective:   Weight Range: 68.9 kg Body mass index is 22.45 kg/m.   Vital Signs:   Temp:  [97.6 F (36.4 C)-98.3 F (36.8 C)] 97.6 F (36.4 C) (07/19 0520) Pulse Rate:  [80-88] 82 (07/19 0520) Resp:  [12-45] 12 (07/19 0520) BP: (73-142)/(52-104) 127/91 (07/19 0520) SpO2:  [90 %-99 %] 95 % (07/19 0520) Weight:  [68.9 kg-69.3 kg] 68.9 kg (07/19 0520) Last BM Date : 12/04/21  Weight change: Filed Weights   12/07/21 1940 12/08/21 1517 12/09/21 0520  Weight: 72 kg 69.3 kg 68.9 kg    Intake/Output:   Intake/Output Summary (Last 24 hours) at 12/09/2021 0913 Last data filed at 12/09/2021 0525 Gross per 24 hour  Intake 240 ml  Output 725 ml  Net -485 ml      Physical Exam    General: No resp difficulty HEENT: normal Neck: supple. JVP to jaw . Carotids 2+ bilat; no bruits. No lymphadenopathy or thryomegaly appreciated. Cor: PMI nondisplaced. Regular rate & rhythm. No rubs, gallops or murmurs. Lungs: clear Abdomen: soft, nontender, nondistended. No hepatosplenomegaly. No bruits or masses. Good bowel sounds. Extremities: no cyanosis, clubbing, rash, edema Neuro: alert & orientedx3, cranial nerves grossly intact. moves all 4 extremities w/o difficulty. Affect pleasant   Telemetry  SR 80s personally checked.t    EKG  Sinus Tach, 104 bpm (Personally reviewed)   Labs    CBC Recent Labs    12/07/21 2032 12/08/21 0055  WBC 7.4 6.6  NEUTROABS 4.3  --   HGB 12.6* 10.9*  HCT 39.0 33.4*  MCV 88.2 87.2  PLT 431* 735   Basic Metabolic Panel Recent Labs    12/08/21 0517 12/09/21 0338  NA 140 140  K 4.7 4.1  CL 101 103  CO2 27 28  GLUCOSE 165* 145*  BUN 15 17  CREATININE 1.37* 1.35*  CALCIUM 8.8* 8.4*  MG 2.0 1.9   Liver Function Tests Recent Labs    12/07/21 2032  AST 33  ALT  34  ALKPHOS 143*  BILITOT 0.9  PROT 6.9  ALBUMIN 3.4*   Recent Labs    12/07/21 2032  LIPASE 26   Cardiac Enzymes No results for input(s): "CKTOTAL", "CKMB", "CKMBINDEX", "TROPONINI" in the last 72 hours.  BNP: BNP (last 3 results) Recent Labs    10/05/21 0530 11/10/21 1824 12/07/21 1130  BNP 655.0* 319.8* 3,127.4*    ProBNP (last 3 results) Recent Labs    02/03/21 0911  PROBNP 3,012.0*     D-Dimer No results for input(s): "DDIMER" in the last 72 hours. Hemoglobin A1C No results for input(s): "HGBA1C" in the last 72 hours. Fasting Lipid Panel No results for input(s): "CHOL", "HDL", "LDLCALC", "TRIG", "CHOLHDL", "LDLDIRECT" in the last 72 hours. Thyroid Function Tests No results for input(s): "TSH", "T4TOTAL", "T3FREE", "THYROIDAB" in the last 72 hours.  Invalid input(s): "FREET3"  Other results:   Imaging    No results found.   Medications:     Scheduled Medications:  aspirin EC  81 mg Oral Daily   atorvastatin  80 mg Oral Daily   carvedilol  6.25 mg Oral BID WC   enoxaparin (LOVENOX) injection  40 mg Subcutaneous Q24H   furosemide  80 mg Intravenous BID   hydrALAZINE  50 mg Oral Q8H   isosorbide mononitrate  30 mg Oral  Daily   lidocaine  1 patch Transdermal Q24H   methocarbamol  500 mg Oral BID   sacubitril-valsartan  1 tablet Oral BID   sodium chloride flush  3 mL Intravenous Q12H   spironolactone  25 mg Oral Daily   Tafamidis  61 mg Oral Daily    Infusions:  sodium chloride      PRN Medications: sodium chloride, acetaminophen, ondansetron (ZOFRAN) IV, senna-docusate, sodium chloride flush    Patient Profile   [name] is a 61 y.o. male with history of CAD with prior MI and stent in 01/2019 at [facility] in [place] (report not available), cardiomyopathy/chronic systolic HF, uncontrolled DM II, HTN, hx CVA, CKD. Admitted yesterday, was seen in HF APP clinic and was advised to go to ED but went home and had a fall. Here with A/C  Systolic HF.    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 He reports cardiomyopathy diagnosed just prior to MI while living in [place] a couple of years ago. - Echo (10/22): EF 20-25%, RV ok - R/LHC (10/22): Patent RPLV stent with nonobstructive disease elsewhere, Preserved CO/CI, RA mean 5 mmHg, PCWP mean 5 mmHg - cMRI (11/22): LVEF 24% RVEF 21% LGE and ECW suggestive of cardiac amyloidosis. 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 I thought equivocal but based on cMRI likely positive Will need genetic testing. - Multiple myeloma panel (12/22) with no m-spike. - ECHO (6/23) LVEF 30-35%, LV global hypokinesis, Grade 1 DD, RV function normal - Presented with NYHA III, physically limited by neuropathy in feet. Volume up on exam, REDs 37% yesterday. On admission: BNP 3127, HsTrop 20>42>18>18. Acute exacerbation likely secondary to noncompliance with meds.  - Volume status remains elevated. Continue IV lasix 80 mg twice a day and give dose of metolazone.  - Continue hydralazine 50 mg tid and Imdur 30 mg daily.  - Continue Entresto 97/103 mg bid. - Continue carvedilol 6.25 mg bid.  - Continue spironolactone 25 mg daily.  - No SGLT2i w/ A1c 12. - Await genetic testing.   - Continue tafamadis, and will likely benefit from [place] as well - - Daily weights, strict I&O - Renal function stable.    2. CAD - Chest pain on admission, now denies CP. HsTrop now flat HsTrop 20>42>18>18. Likely secondary to fluid overload d/t medication noncompliance.   - Prior MI followed by PCI in [place] in either 2019 or 2020.  - Patent RPLV stent on [facility] 10/22. Also had 20% distal RCA, 30% p LAD, 70% d LAD, 60% OM2 - Continue ASA  - Stent placed 2020. >78yrsince stent placement, could consider stopping Plavix d/t issues with noncompliance. - Continue atorvastatin 80 mg daily. - No chest pain.    3. HTN -  Increase hydralazine 75 mg three times a day.    4.  Uncontrolled DM - Needs PCP follow up. - On Januvia. - No SGLT2i with A1c 12.   5. PVCs Rare   6. Neuropathy - This seems to be his biggest limiter. - On gabapentin. - Referred to Dr. PPosey Prontoin Neurology. Consider Amvuttra.   7. SDOH - He has Medicaid and disability. - Transportation is an issue, he has a car but unable to drive (needs license). - 168year old son helps with meds. - Paramedicine to follow outpatient for medication compliance.  8. Mechanical fall - X-ray of lumbars ruled out fracture - PT/OT to follow - Can add ultram.  Length of Stay: 1 Amy Clegg NP-C   12/09/2021, 9:13 AM  Advanced Heart Failure Team Pager [phone] (M-F; 7a - 5p)  Please contact [facility] for night-coverage after hours (5p -7a ) and weekends on [email or web address]  Patient seen and examined with the above-signed Advanced Practice Provider and/or Housestaff. I personally reviewed laboratory data, imaging studies and relevant notes. I independently examined the patient and formulated the important aspects of the plan. I have edited the note to reflect any of my changes or salient points. I have personally discussed the plan with the patient and/or family.  Feels bloated. C/o back pain. + SOB   General:  Lying flat in bed  No resp difficulty HEENT: normal Neck: supple. JVP to jaw  Carotids 2+ bilat; no bruits. No lymphadenopathy or thryomegaly appreciated. Cor: PMI nondisplaced. Regular rate & rhythm. No rubs, gallops or murmurs. Lungs: clear Abdomen: soft, nontender, nondistended. No hepatosplenomegaly. No bruits or masses. Good bowel sounds. Extremities: no cyanosis, clubbing, rash, edema Neuro: alert & orientedx3, cranial nerves grossly intact. moves all 4 extremities w/o difficulty. Affect pleasant  He remains volume overloaded. Continue IV lasix. Give metolazone.   CT reviewed personally. No significant findings to explain back pain. Will start tramadol. Have PT see to ambulate. Can use  heating pad.   Glori Bickers, MD  2:51 PM

## 2021-12-09 NOTE — Consult Note (Signed)
   [facility] CM Inpatient Consult   12/09/2021  Ananias Kolander 12-21-1960 [id number]  [facility] [[facility]]  Accountable Care Organization [ACO] Patient: [employer] Medicare   Primary Care Provider:  [name], [name], MD, is an embedded provider with a Chronic Care Management team and program, and is listed for the transition of care follow up and appointments.  Patient was screened for Embedded practice service needs for chronic care management with a recent referral for care coordination for transportation needs.  Plan:  [place] to follow for TOC needs for post hospital needs.  Please contact for further questions,  Charlesetta Shanks, RN BSN CCM [facility]  [phone] business mobile phone Toll free office [phone]  Fax number: [phone] [email or web address]

## 2021-12-09 NOTE — Progress Notes (Signed)
Patient complains about back pain related to a fall at home. Tylenol and tramadol dose are not due to be given. Patient refused tylenol and tramadol. Patient relayed "tramadol does not work for me. I want percocet." Spoke to Tonye Becket NP and Bensimhon MD at nurses station and relayed to the team. They want the patient to receive only tramadol, no percocet. RN relayed to the patient of no new orders for additional orders. Plan of care continue.

## 2021-12-09 NOTE — Evaluation (Signed)
Occupational Therapy Evaluation Patient Details Name: Thomas West MRN: [id number] DOB: 1961/01/20 Today's Date: 12/09/2021   History of Present Illness 61 yo male with onset of fall and chest pain was admitted 7/17, noted his volume of fluids was overloaded and in CHF.  Has been home with his son who is well able to assist with school being out currently.  Pt has been off lasix, had delayed coming to ED.  PMHx:  CHF, amylodiosis, CAD, NSTEMI, HTN, PN, CKD2   Clinical Impression   Pt PTA; pt living with 85 yo son and reports that he has been mostly independent with ADL and using a rollator for mobility. Pt currently, set-upA to perform ADL tasks standing at sink with overall modfiied independence, but fatigues easily. Pt at times SOB, but O2 sats >90% on RA. Pt's HR remained <100 BPM with exertion. Pt using RW for mobility. Pt minguardA for mobility in room. Pt reports that he does have a license and a car to use that is registered to him. Pt reports that he has a lot of family in the area to assist. Pt would benefit from continued OT skilled services. OT following actuely.     Recommendations for follow up therapy are one component of a multi-disciplinary discharge planning process, led by the attending physician.  Recommendations may be updated based on patient status, additional functional criteria and insurance authorization.   Follow Up Recommendations  No OT follow up    Assistance Recommended at Discharge Set up Supervision/Assistance  Patient can return home with the following A little help with walking and/or transfers;Assist for transportation;Help with stairs or ramp for entrance    Functional Status Assessment  Patient has had a recent decline in their functional status and demonstrates the ability to make significant improvements in function in a reasonable and predictable amount of time.  Equipment Recommendations  Tub/shower bench    Recommendations for Other Services        Precautions / Restrictions Precautions Precautions: Fall Restrictions Weight Bearing Restrictions: No      Mobility Bed Mobility Overal bed mobility: Needs Assistance Bed Mobility: Sidelying to Sit, Sit to Sidelying   Sidelying to sit: Min assist     Sit to sidelying: Min assist General bed mobility comments: pt is able to get up from gurney with minor help but is awkwardly postured to start    Transfers Overall transfer level: Needs assistance Equipment used: Rolling walker (2 wheels) Transfers: Sit to/from Stand Sit to Stand: Min guard           General transfer comment: min guard for safety of lines      Balance Overall balance assessment: Needs assistance, History of Falls Sitting-balance support: Feet supported Sitting balance-Leahy Scale: Good     Standing balance support: Bilateral upper extremity supported, During functional activity Standing balance-Leahy Scale: Fair Standing balance comment: able to perform ADL tasks with increased time occasionally leaning on sink                           ADL either performed or assessed with clinical judgement   ADL Overall ADL's : Modified independent;At baseline                                       General ADL Comments: Pt set-upA to perform ADL tasks standing at sink with overall modfiied  independence. Pt at times SOB, but O2 stas >90% on RA. Pt's HR remained <100 BPM with exertion.     Vision Baseline Vision/History: 0 No visual deficits Ability to See in Adequate Light: 0 Adequate Patient Visual Report: No change from baseline Vision Assessment?: No apparent visual deficits     Perception     Praxis      Pertinent Vitals/Pain Pain Assessment Pain Assessment: No/denies pain     Hand Dominance Right   Extremity/Trunk Assessment Upper Extremity Assessment Upper Extremity Assessment: Overall WFL for tasks assessed   Lower Extremity Assessment Lower Extremity  Assessment: Generalized weakness   Cervical / Trunk Assessment Cervical / Trunk Assessment: Normal   Communication Communication Communication: No difficulties   Cognition Arousal/Alertness: Awake/alert Behavior During Therapy: WFL for tasks assessed/performed Overall Cognitive Status: Within Functional Limits for tasks assessed                                       General Comments  Pt using RW for mobility. Pt minguardA for mobility in room. Pt reports that he does have a license and a car to use that is registered to him.    Exercises     Shoulder Instructions      Home Living Family/patient expects to be discharged to:: Private residence Living Arrangements: Children Available Help at Discharge: Family;Available PRN/intermittently Type of Home: House Home Access: Stairs to enter [employer] of Steps: 1 Entrance Stairs-Rails: None Home Layout: Two level;Bed/bath upstairs     Bathroom Shower/Tub: Producer, television/film/video: Standard     Home Equipment: Rollator (4 wheels);BSC/3in1          Prior Functioning/Environment Prior Level of Function : Needs assist       Physical Assist : Mobility (physical) Mobility (physical): Stairs   Mobility Comments: son assists on stairs          OT Problem List: Decreased strength;Decreased activity tolerance;Impaired balance (sitting and/or standing);Increased edema      OT Treatment/Interventions: Self-care/ADL training;Therapeutic exercise;Energy conservation;DME and/or AE instruction;Therapeutic activities;Patient/family education;Balance training    OT Goals(Current goals can be found in the care plan section) Acute Rehab OT Goals Patient Stated Goal: to go home OT Goal Formulation: With patient Time For Goal Achievement: 12/23/21 Potential to Achieve Goals: Good ADL Goals Additional ADL Goal #1: Pt will increase to x20 mins of OOB ADL tasks with <2 seated rest breaks to  increase activity tolerance. Additional ADL Goal #2: Pt will state 3 energy conservation techniques for OOB ADL and mobility.  OT Frequency: Min 2X/week    Co-evaluation              AM-PAC OT "6 Clicks" Daily Activity     Outcome Measure Help from another person eating meals?: None Help from another person taking care of personal grooming?: A Little Help from another person toileting, which includes using toliet, bedpan, or urinal?: A Little Help from another person bathing (including washing, rinsing, drying)?: A Little Help from another person to put on and taking off regular upper body clothing?: A Little Help from another person to put on and taking off regular lower body clothing?: A Little 6 Click Score: 19   End of Session Equipment Utilized During Treatment: Gait belt;Rolling walker (2 wheels) Nurse Communication: Mobility status  Activity Tolerance: Patient limited by fatigue Patient left: in chair;with call bell/phone within reach;with chair alarm  set  OT Visit Diagnosis: Unsteadiness on feet (R26.81);Muscle weakness (generalized) (M62.81)                Time: 7322-0254 OT Time Calculation (min): 35 min Charges:  OT General Charges $OT Visit: 1 Visit OT Evaluation $OT Eval Moderate Complexity: 1 Mod OT Treatments $Self Care/Home Management : 8-22 mins  Flora Lipps, OTR/L Acute Rehabilitation Services Office: [phone]   Lonzo Cloud 12/09/2021, 3:27 PM

## 2021-12-10 ENCOUNTER — Telehealth: Payer: Self-pay

## 2021-12-10 ENCOUNTER — Other Ambulatory Visit (HOSPITAL_COMMUNITY): Payer: Self-pay

## 2021-12-10 ENCOUNTER — Other Ambulatory Visit (HOSPITAL_COMMUNITY): Payer: Self-pay | Admitting: Internal Medicine

## 2021-12-10 ENCOUNTER — Telehealth (HOSPITAL_COMMUNITY): Payer: Self-pay | Admitting: Pharmacy Technician

## 2021-12-10 DIAGNOSIS — I5023 Acute on chronic systolic (congestive) heart failure: Secondary | ICD-10-CM | POA: Diagnosis not present

## 2021-12-10 LAB — BASIC METABOLIC PANEL
Anion gap: 10 (ref 5–15)
BUN: 23 mg/dL — ABNORMAL HIGH (ref 6–20)
CO2: 29 mmol/L (ref 22–32)
Calcium: 8.5 mg/dL — ABNORMAL LOW (ref 8.9–10.3)
Chloride: 99 mmol/L (ref 98–111)
Creatinine, Ser: 1.75 mg/dL — ABNORMAL HIGH (ref 0.61–1.24)
GFR, Estimated: 45 mL/min — ABNORMAL LOW (ref >60)
Glucose, Bld: 157 mg/dL — ABNORMAL HIGH (ref 70–99)
Potassium: 4.4 mmol/L (ref 3.5–5.1)
Sodium: 138 mmol/L (ref 135–145)

## 2021-12-10 LAB — MAGNESIUM: Magnesium: 1.9 mg/dL (ref 1.7–2.4)

## 2021-12-10 MED ORDER — ATORVASTATIN CALCIUM 80 MG PO TABS
80.0000 mg | ORAL_TABLET | Freq: Every day | ORAL | 6 refills | Status: DC
  Filled 2021-12-10 – 2022-01-27 (×2): qty 30, 30d supply, fill #0
  Filled 2022-02-26: qty 30, 30d supply, fill #1
  Filled 2022-04-14: qty 30, 30d supply, fill #2
  Filled 2022-05-26: qty 30, 30d supply, fill #3
  Filled 2022-07-01: qty 30, 30d supply, fill #4

## 2021-12-10 MED ORDER — ENTRESTO 97-103 MG PO TABS
1.0000 | ORAL_TABLET | Freq: Two times a day (BID) | ORAL | 6 refills | Status: DC
  Filled 2021-12-10 (×2): qty 60, 30d supply, fill #0

## 2021-12-10 MED ORDER — HYDROMORPHONE HCL 1 MG/ML IJ SOLN
1.0000 mg | Freq: Once | INTRAMUSCULAR | Status: DC | PRN
  Filled 2021-12-10: qty 1

## 2021-12-10 MED ORDER — ISOSORBIDE MONONITRATE ER 30 MG PO TB24
30.0000 mg | ORAL_TABLET | Freq: Every day | ORAL | 6 refills | Status: DC
  Filled 2021-12-10 – 2022-02-12 (×3): qty 30, 30d supply, fill #0
  Filled 2022-03-10: qty 30, 30d supply, fill #1
  Filled 2022-05-26: qty 30, 30d supply, fill #2
  Filled 2022-07-01: qty 30, 30d supply, fill #3
  Filled 2022-09-15: qty 30, 30d supply, fill #4

## 2021-12-10 MED ORDER — SPIRONOLACTONE 25 MG PO TABS
25.0000 mg | ORAL_TABLET | Freq: Every day | ORAL | 6 refills | Status: DC
  Filled 2021-12-10: qty 30, 30d supply, fill #0

## 2021-12-10 MED ORDER — HYDRALAZINE HCL 50 MG PO TABS
50.0000 mg | ORAL_TABLET | Freq: Three times a day (TID) | ORAL | 6 refills | Status: DC
  Filled 2021-12-10 – 2022-01-27 (×2): qty 90, 30d supply, fill #0
  Filled 2022-02-26: qty 90, 30d supply, fill #1
  Filled 2022-07-14: qty 90, 30d supply, fill #2

## 2021-12-10 MED ORDER — OXYCODONE-ACETAMINOPHEN 5-325 MG PO TABS
1.0000 | ORAL_TABLET | Freq: Once | ORAL | Status: AC | PRN
  Administered 2021-12-10: 1 via ORAL
  Filled 2021-12-10: qty 1

## 2021-12-10 MED ORDER — HYDROCORTISONE 1 % EX CREA
TOPICAL_CREAM | CUTANEOUS | 0 refills | Status: DC
  Filled 2021-12-10: qty 28, 30d supply, fill #0

## 2021-12-10 MED ORDER — FUROSEMIDE 40 MG PO TABS
40.0000 mg | ORAL_TABLET | Freq: Every day | ORAL | 6 refills | Status: DC
  Filled 2021-12-10: qty 30, 30d supply, fill #0

## 2021-12-10 MED ORDER — MAGNESIUM SULFATE 2 GM/50ML IV SOLN
2.0000 g | Freq: Once | INTRAVENOUS | Status: AC
  Administered 2021-12-10: 2 g via INTRAVENOUS
  Filled 2021-12-10: qty 50

## 2021-12-10 MED ORDER — CARVEDILOL 6.25 MG PO TABS
6.2500 mg | ORAL_TABLET | Freq: Two times a day (BID) | ORAL | 6 refills | Status: DC
  Filled 2021-12-10 – 2022-01-27 (×2): qty 60, 30d supply, fill #0
  Filled 2022-02-26: qty 60, 30d supply, fill #1
  Filled 2022-05-26: qty 60, 30d supply, fill #2
  Filled 2022-07-01: qty 60, 30d supply, fill #3
  Filled 2022-09-15: qty 60, 30d supply, fill #4

## 2021-12-10 MED ORDER — ASPIRIN 81 MG PO TBEC
81.0000 mg | DELAYED_RELEASE_TABLET | Freq: Every day | ORAL | 12 refills | Status: DC
  Filled 2021-12-10 – 2022-03-26 (×3): qty 30, 30d supply, fill #0

## 2021-12-10 NOTE — Discharge Summary (Addendum)
**Note Thomas-Identified via Obfuscation** Advanced Heart Failure Team  Discharge Summary   Patient ID: Thomas West MRN: [id number], DOB/AGE: [birth year] 59 y.o. Admit date: 12/07/2021 D/C date:     12/10/2021   Primary Discharge Diagnoses:  Acute on chronic systolic heart failure / Cardiac amyloidosis CAD HTN Uncontrolled DM PVC's Neuropathy SDOH Mechanical fall AKI NSVT  Hospital Course:  Thomas West is a 61y AA male with PMH of CAD with prior MI and stent in 01/2019 at [facility] in [place] (report not available), cardiomyopathy/chronic systolic HF, uncontrolled DM II, HTN, hx CVA, CKD.     Admitted with A/C HFrEF in the setting of medication noncompliance. Prior to admit he was out of all medications ~ 2 months. Diuresed  with IV lasix and metolazone. Overall diuresed 13 pounds. On admission ReDS clip 37%>>28% at discharge. He had an 8 beat run of NSVT, patient was asymptomatic and his Mg was replanted and will continue on bb. On the day of discharge creatinine bumped so entresto and spiro held and will be restarted tomorrow. GDMT limited by AKI. Plan to repeat BMET next week. All HF medications sent to the [facility].   Paramedicine arranged to see patient this week. HHRN and HHPT to follow. He has difficulty with transportation and was provided with taxi voucher.   He will continue to be followed closely in the HF clinic. Plan to check BMET/BNP next week. Thomas West evaluated and deemed appropriate for discharge.   See below for detailed problem list.  Acute on chronic systolic HF/Cardiac amyloidosis - Onset not certain. He reports cardiomyopathy diagnosed just prior to MI while living in [place] a couple of years ago. - Echo (10/22): EF 20-25%, RV ok - R/LHC (10/22): Patent RPLV stent with nonobstructive disease elsewhere, Preserved CO/CI, RA mean 5 mmHg, PCWP mean 5 mmHg - cMRI (11/22): LVEF 24% RVEF 21% LGE and ECW suggestive of cardiac amyloidosis. 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 I thought  equivocal but based on cMRI likely positive Will need genetic testing. - Multiple myeloma panel (12/22) with no m-spike. - ECHO (6/23) LVEF 30-35%, LV global hypokinesis, Grade 1 DD, RV function normal - Presented with NYHA III. Admitted with volume overload.  REDs 37% 7/18. On admission: BNP 3127. Acute exacerbation likely secondary to noncompliance with meds. Was out  of meds for about 2 months.  - Continue hydralazine 50 mg tid and Imdur 30 mg daily.  - Hold Entresto 97/103 mg bid in setting of AKI. Restart 12/11/21 - Continue carvedilol 6.25 mg bid.  - Hold spironolactone 25 mg daily in setting of AKI. Restart 12/11/21 - No SGLT2i w/ A1c 12. - Await genetic testing.   - Continue tafamadis. Tafamidis is being mailed to his home from [facility].  - May need Amvuttra as well. Will need Neurology follow.   2. CAD - Chest pain on admission, now denies CP. HsTrop now flat HsTrop 20>42>18>18.  - Prior MI followed by PCI in [place] in either 2019 or 2020.  - Patent RPLV stent on [facility] 10/22. Also had 20% distal RCA, 30% p LAD, 70% d LAD, 60% OM2 - Continue ASA  - Stent placed 2020. >59yrsince stent placement, could consider stopping Plavix d/t issues with noncompliance. - Continue atorvastatin 80 mg daily. - No chest pain.    3. HTN -  Continue hydralazine TID   4. Uncontrolled DM - Needs PCP follow up. - On Januvia. - No SGLT2i with A1c 12.   5. PVCs -  Rare   6. Neuropathy - This seems to be his biggest limiter. - On gabapentin. - Referred to Dr. Posey Pronto in Neurology. Consider Amvuttra.   7. SDOH - He has Medicaid and disability. - Transportation is an issue, he has a car but unable to drive (needs license). - 28 year old son helps with meds. - Paramedicine to follow outpatient for medication compliance.   8. Mechanical fall - X-ray of lumbars ruled out fracture - PT/OT consulted.  - HH PT to follow at d/c    9. AKI - Suspect in the setting of over diuresis - Cr  >> 1.2 >>1.3>>1.3>>1.75 - Hold diuretics, Thomas West, and Thomas West.  _ Plan to restart entresto and spiro tomorrow.    10. NSVT - Had 8 beat run overnight, patient asymptomatic - K 4.4, Mg 1.9, will repleat w/ 2gm Mg - Continue BB  Discharge Weight Range: 65.8 kg (145lbs)  Discharge Vitals: Blood pressure 103/73, pulse 80, temperature 97.6 F (36.4 C), temperature source Oral, resp. rate 18, height '5\' 9"'  (1.753 m), weight 65.8 kg, SpO2 97 %.  Labs: Lab Results  Component Value Date   WBC 6.6 12/08/2021   HGB 10.9 (L) 12/08/2021   HCT 33.4 (L) 12/08/2021   MCV 87.2 12/08/2021   PLT 375 12/08/2021    Recent Labs  Lab 12/07/21 2032 12/08/21 0055 12/10/21 0252  NA 139   < > 138  K 4.1   < > 4.4  CL 106   < > 99  CO2 24   < > 29  BUN 14   < > 23*  CREATININE 1.40*   < > 1.75*  CALCIUM 8.5*   < > 8.5*  PROT 6.9  --   --   BILITOT 0.9  --   --   ALKPHOS 143*  --   --   ALT 34  --   --   AST 33  --   --   GLUCOSE 135*   < > 157*   < > = values in this interval not displayed.   Lab Results  Component Value Date   CHOL 138 03/13/2021   HDL 40 (L) 03/13/2021   LDLCALC 86 03/13/2021   TRIG 60 03/13/2021   BNP (last 3 results) Recent Labs    10/05/21 0530 11/10/21 1824 12/07/21 1130  BNP 655.0* 319.8* 3,127.4*    ProBNP (last 3 results) Recent Labs    02/03/21 0911  PROBNP 3,012.0*    Diagnostic Studies/Procedures   No results found.  Discharge Medications   Allergies as of 12/10/2021       Reactions   Bee Venom    swelling        Medication List     STOP taking these medications    hydrOXYzine 25 MG tablet Commonly known as: ATARAX   Januvia 25 MG tablet Generic drug: sitaGLIPtin   methocarbamol 500 MG tablet Commonly known as: ROBAXIN   potassium chloride 10 MEQ tablet Commonly known as: KLOR-CON M       TAKE these medications    aspirin EC 81 MG tablet Take 1 tablet (81 mg total) by mouth daily. Swallow whole. Start taking on:  December 11, 2021 What changed: additional instructions   atorvastatin 80 MG tablet Commonly known as: LIPITOR Take 1 tablet (80 mg total) by mouth daily.   carvedilol 6.25 MG tablet Commonly known as: COREG Take 1 tablet (6.25 mg total) by mouth EVERY 12 HOURS (10AM &10PM)   Entresto 97-103  MG Generic drug: sacubitril-valsartan Take 1 tablet by mouth 2 (two) times daily. Start taking on: December 11, 2021   furosemide 40 MG tablet Commonly known as: Lasix Take 1 tablet (40 mg total) by mouth daily. Start taking on: December 11, 2021   hydrALAZINE 50 MG tablet Commonly known as: APRESOLINE Take 1 tablet (50 mg total) by mouth 3 (three) times daily.   hydrocortisone cream 1 % Apply to affected area 2 times daily   isosorbide mononitrate 30 MG 24 hr tablet Commonly known as: IMDUR Take 1 tablet (30 mg total) by mouth daily.   spironolactone 25 MG tablet Commonly known as: ALDACTONE Take 1 tablet (25 mg total) by mouth daily. Start taking on: December 11, 2021   Vyndamax 61 MG Caps Generic drug: Tafamidis Take 1 capsule by mouth daily.        Disposition   The patient will be discharged in stable condition to home. Discharge Instructions     (HEART FAILURE PATIENTS) Call MD:  Anytime you have any of the following symptoms: 1) 3 pound weight gain in 24 hours or 5 pounds in 1 week 2) shortness of breath, with or without a dry hacking cough 3) swelling in the hands, feet or stomach 4) if you have to sleep on extra pillows at night in order to breathe.   Complete by: As directed    Diet - low sodium heart healthy   Complete by: As directed    Increase activity slowly   Complete by: As directed        Follow-up Information     [name], [name], MD Follow up.   Specialty: Family Medicine Why: appt 12/22/2021 at 11 am Contact information: [place] [address] [phone]         [facility] Follow up on  12/14/2021.   Specialty: Cardiology Why: 12/14/21 130 PM. Please bring all medication. [facility] Contact information: [address] [id number] [place] [phone]        [facility] Follow up.   Why: Home Health RN and Physical Therapy-agency will call with appt time Contact information: [phone]                  Duration of Discharge Encounter: Greater than 35 minutes   Signed, Earnie Larsson, AGACNP-BC 12/10/2021, 12:14 PM \\  Agree with above. Volume status much improved. He is ready for d/c. F/u in HF Clinic.   Glori Bickers, MD  6:35 PM

## 2021-12-10 NOTE — Telephone Encounter (Signed)
Pharmacy Patient Advocate Encounter  Insurance verification completed.    The patient is insured through [employer]   The patient is currently admitted and ran test claims for the following: Entresto.  Copays and coinsurance results were relayed to Inpatient clinical team.

## 2021-12-10 NOTE — Progress Notes (Signed)
TOC meds will be delivered by Ardyth Gal RN to pts home.

## 2021-12-10 NOTE — Telephone Encounter (Signed)
   Telephone encounter was:  Successful.  12/10/2021 Name: Thomas West MRN: [id number] DOB: [birth year]  Thomas West is a 61 y.o. year old male who is a primary care patient of [name], [name] Expose, MD . The community resource team was consulted for assistance with Transportation Needs   Care guide performed the following interventions: Patient provided with information about care guide support team and interviewed to confirm resource needs. Patients sister stated this patient does not have transportation needs at this time  Follow Up Plan:  No further follow up planned at this time. The patient has been provided with needed resources.    Lenard Forth Care Guide, Embedded Care Coordination [facility], Care Management  [phone] [address] [place], [place], [place] [address] Phone: [phone] Email: [email or web address]

## 2021-12-10 NOTE — TOC Initial Note (Signed)
Transition of Care [facility]) - Initial/Assessment Note    Patient Details  Name: Thomas West MRN: [id number] Date of Birth: 1961-02-12  Transition of Care [facility]) CM/SW Contact:    Elliot Cousin, RN Phone Number: [phone] 12/10/2021, 2:48 PM  Clinical Narrative:                  HF TOC CM spoke to pt and offered choice for [facility] (provided medicare.gov list with ratings, and placed in chart). Pt agreeable to [facility] for [facility]. Contacted rep, Morrie Sheldon with new referral and accepted with start of care this weekend. Pt has RW at home. Provided taxi voucher for home. Pt's family is out of town. Meds to come up from [facility] pharmacy.    Expected Discharge Plan: Home w Home Health Services Barriers to Discharge: No Barriers Identified   Patient Goals and CMS Choice Patient states their goals for this hospitalization and ongoing recovery are:: wants to get better      Expected Discharge Plan and Services Expected Discharge Plan: Home w Home Health Services   Discharge Planning Services: CM Consult   Living arrangements for the past 2 months: Apartment Expected Discharge Date: 12/10/21                  HH Arranged: RN, PT [facility] Date HH Agency Contacted: 12/10/21 Time HH Agency Contacted: 1154    Prior Living Arrangements/Services Living arrangements for the past 2 months: Apartment Lives with:: Minor Children Patient language and need for interpreter reviewed:: Yes Do you feel safe going back to the place where you live?: Yes      Need for Family Participation in Patient Care: No (Comment) Care giver support system in place?: No (comment) Current home services: DME (rollator) Criminal Activity/Legal Involvement Pertinent to Current Situation/Hospitalization: No - Comment as needed  Activities of Daily Living      Permission Sought/Granted Permission sought to share information with : Case Manager, Family Supports, PCP Permission  granted to share information with : Yes, Verbal Permission Granted  Share Information with NAME: Kym Groom     Permission granted to share info w Relationship: sister  Permission granted to share info w Contact Information: [phone]  Emotional Assessment Appearance:: Appears stated age Attitude/Demeanor/Rapport: Engaged Affect (typically observed): Accepting Orientation: : Oriented to Self, Oriented to Place, Oriented to  Time, Oriented to Situation   Psych Involvement: No (comment)  Admission diagnosis:  Heart failure (HCC) [I50.9] Fall, initial encounter [W19.XXXA] Acute midline low back pain without sciatica [M54.50] Chest pain, unspecified type [R07.9] Patient Active Problem List   Diagnosis Date Noted   Heart failure (HCC) 12/08/2021   Hypertension associated with diabetes (HCC) 07/12/2021   Chronic kidney disease, stage 3a (HCC) 07/12/2021   Hyperkalemia 07/12/2021   CHF exacerbation (HCC) 04/20/2021   Acute on chronic combined systolic and diastolic CHF (congestive heart failure) (HCC) 03/09/2021   Acute on chronic HFrEF (heart failure with reduced ejection fraction) (HCC) 03/08/2021   HFrEF (heart failure with reduced ejection fraction) (HCC) 02/03/2021   Peripheral neuropathy 02/03/2021   Diabetes mellitus (HCC) 03/27/2019   Hyperlipidemia associated with type 2 diabetes mellitus (HCC) 03/27/2019   CAD (coronary artery disease) 03/05/2019   History of MI (myocardial infarction) 03/05/2019   Type 2 diabetes mellitus with diabetic neuropathy, unspecified (HCC) 03/05/2019   CKD (chronic kidney disease), stage II 03/05/2019   HLD (hyperlipidemia) 03/05/2019   GERD (gastroesophageal reflux disease) 03/05/2019   Hypertension, essential,  benign 03/05/2019   PCP:  [name], [name], MD Pharmacy:   [facility] - [place], [place] - [address] [facility] [address] [place] [address] Phone: [phone] Fax: [phone]  [id number] [place], [place] - [address] AT [facility] [address] [place] [address] Phone: [phone] Fax: [phone]  [facility] [address] [place] [address] Phone: [phone] Fax: [phone]     Social Determinants of Health (SDOH) Interventions    Readmission Risk Interventions    07/14/2021    4:34 PM  Readmission Risk Prevention Plan  Transportation Screening Complete  HRI or Home Care Consult Complete  Social Work Consult for Recovery Care Planning/Counseling Complete  Palliative Care Screening Not Applicable  Medication Review Oceanographer) Complete

## 2021-12-10 NOTE — Progress Notes (Signed)
Physical Therapy Treatment Patient Details Name: Thomas West MRN: [id number] DOB: Nov 12, 1960 Today's Date: 12/10/2021   History of Present Illness 61 yo male with onset of fall and chest pain was admitted 7/17, noted his volume of fluids was overloaded and in CHF.  Has been home with his son who is well able to assist with school being out currently.  Pt has been off lasix, had delayed coming to ED.  PMHx:  CHF, amylodiosis, CAD, NSTEMI, HTN, PN, CKD2    PT Comments    Pt received seated EOB and agreeable to session with continued progress towards acute goals. Pt demonstrating increased ambulation tolerance with min guard throughout for safety and light cues for upright posture. Pt able to self pace throughout, taking x2 standing recovery breaks secondary to fatigue. Pt able to ascend/descend 9 steps in stairwell with step to pattern sideways with BUE on rail. Pt endorsing increased stability with this strategy and verbalizing importance of stair safety at home. Educated pt re; daily weights, nutrition, increasing activity tolerance slowly, and activity recommendations with pt verbalizing understanding. Pt expressing concern over transport home at d/c as he does not have anyone to pick him up, notified TOC team of pt concern. Pt continues to benefit from skilled PT services to progress toward functional mobility goals.     Recommendations for follow up therapy are one component of a multi-disciplinary discharge planning process, led by the attending physician.  Recommendations may be updated based on patient status, additional functional criteria and insurance authorization.  Follow Up Recommendations  Home health PT     Assistance Recommended at Discharge Intermittent Supervision/Assistance  Patient can return home with the following A little help with walking and/or transfers;A little help with bathing/dressing/bathroom;Assistance with cooking/housework;Assist for transportation;Help with  stairs or ramp for entrance   Equipment Recommendations  None recommended by PT    Recommendations for Other Services       Precautions / Restrictions Precautions Precautions: Fall Restrictions Weight Bearing Restrictions: No     Mobility  Bed Mobility Overal bed mobility: Needs Assistance             General bed mobility comments: pt seated EOB pre-post session    Transfers Overall transfer level: Needs assistance Equipment used: Rolling walker (2 wheels) Transfers: Sit to/from Stand Sit to Stand: Min guard           General transfer comment: min guard for safety of lines, STS x5 with use of UEs to push off    Ambulation/Gait Ambulation/Gait assistance: Min guard Gait Distance (Feet): 190 Feet Assistive device: Rolling walker (2 wheels), 1 person hand held assist Gait Pattern/deviations: Step-through pattern, Decreased stride length Gait velocity: reduced     General Gait Details: slow steady gait with RW, cues for upright posture throughout, pt able to self pace taking x2 standing rest break   Stairs Stairs: Yes Stairs assistance: Min guard, Min assist Stair Management: One rail Right, Step to pattern, Sideways Number of Stairs: 9 General stair comments: up down steps in stairwell, min assist at start for cues for sequencing and to utilize step to pattern for safety as well as ascent/descent sideways, as pt with one rail at home, benefits from BUE use on rail for stability. pt endorsing increased stability with step to pattern   Wheelchair Mobility    Modified Rankin (Stroke Patients Only)       Balance Overall balance assessment: Needs assistance, History of Falls Sitting-balance support: Feet supported Sitting balance-Leahy Scale: Good  Standing balance support: Bilateral upper extremity supported, During functional activity Standing balance-Leahy Scale: Fair                              Cognition Arousal/Alertness:  Awake/alert Behavior During Therapy: WFL for tasks assessed/performed Overall Cognitive Status: Within Functional Limits for tasks assessed                                          Exercises Other Exercises Other Exercises: STS x5 <45 seconds, BUE use to push off surface    General Comments        Pertinent Vitals/Pain Pain Assessment Pain Assessment: No/denies pain    Home Living                          Prior Function            PT Goals (current goals can now be found in the care plan section) Acute Rehab PT Goals Patient Stated Goal: to get home and recover his strength PT Goal Formulation: With patient Time For Goal Achievement: 12/22/21    Frequency    Min 3X/week      PT Plan      Co-evaluation              AM-PAC PT "6 Clicks" Mobility   Outcome Measure  Help needed turning from your back to your side while in a flat bed without using bedrails?: A Little Help needed moving from lying on your back to sitting on the side of a flat bed without using bedrails?: A Little Help needed moving to and from a bed to a chair (including a wheelchair)?: A Little Help needed standing up from a chair using your arms (e.g., wheelchair or bedside chair)?: A Little Help needed to walk in hospital room?: A Little Help needed climbing 3-5 steps with a railing? : A Little 6 Click Score: 18    End of Session Equipment Utilized During Treatment: Gait belt Activity Tolerance: Patient tolerated treatment well Patient left: in bed;with call bell/phone within reach (seated EOB) Nurse Communication: Mobility status PT Visit Diagnosis: Other abnormalities of gait and mobility (R26.89);Difficulty in walking, not elsewhere classified (R26.2)     Time: 5790-3833 PT Time Calculation (min) (ACUTE ONLY): 23 min  Charges:  $Gait Training: 23-37 mins                     Andray Assefa R. PTA Acute Rehabilitation Services Office: [phone]    Catalina Antigua 12/10/2021, 9:35 AM

## 2021-12-10 NOTE — Progress Notes (Addendum)
  Advanced Heart Failure Rounding Note  PCP-Cardiologist: Peter Nishan, MD   Subjective:    IV lasix and 1 dose of metolazone given yesterday. -3L UOP. -7lbs  8 beats NSVT overnight. Pt asymptomatic.   AKI- Cr >> 1.2 >>1.3>>1.3>>1.75  Today feels overall breathing is much better. Less pain. Emesis x 1 this morning. Denies CP and SOB.  Objective:   Weight Range: 65.8 kg Body mass index is 21.41 kg/m.   Vital Signs:   Temp:  [97.5 F (36.4 C)-98.1 F (36.7 C)] 97.9 F (36.6 C) (07/20 0511) Pulse Rate:  [75-86] 75 (07/20 0511) Resp:  [10-19] 13 (07/20 0511) BP: (101-109)/(7-79) 103/79 (07/20 0511) SpO2:  [92 %-97 %] 92 % (07/20 0511) Weight:  [65.8 kg] 65.8 kg (07/20 0511) Last BM Date : 12/09/21  Weight change: Filed Weights   12/08/21 1517 12/09/21 0520 12/10/21 0511  Weight: 69.3 kg 68.9 kg 65.8 kg    Intake/Output:   Intake/Output Summary (Last 24 hours) at 12/10/2021 0808 Last data filed at 12/09/2021 2300 Gross per 24 hour  Intake 360 ml  Output 3000 ml  Net -2640 ml      Physical Exam    General:  chronically ill appearing. No respiratory difficulty HEENT: normal Neck: supple. JVD ~10 cm. Carotids 2+ bilat; no bruits. No lymphadenopathy or thyromegaly appreciated. Cor: PMI nondisplaced. Regular rate & rhythm. No rubs, gallops or murmurs. Lungs: clear Abdomen: soft, nontender, nondistended. No hepatosplenomegaly. No bruits or masses. Good bowel sounds. Extremities: no cyanosis, clubbing, rash, BLE nonpitting edema  Neuro: alert & oriented x 3, cranial nerves grossly intact. moves all 4 extremities w/o difficulty. Affect pleasant.  Telemetry  NSR 70's (Personally reviewed)   EKG  No new EKG  Labs    CBC Recent Labs    12/07/21 2032 12/08/21 0055  WBC 7.4 6.6  NEUTROABS 4.3  --   HGB 12.6* 10.9*  HCT 39.0 33.4*  MCV 88.2 87.2  PLT 431* 375   Basic Metabolic Panel Recent Labs    12/09/21 0338 12/10/21 0252  NA 140 138  K 4.1 4.4   CL 103 99  CO2 28 29  GLUCOSE 145* 157*  BUN 17 23*  CREATININE 1.35* 1.75*  CALCIUM 8.4* 8.5*  MG 1.9 1.9   Liver Function Tests Recent Labs    12/07/21 2032  AST 33  ALT 34  ALKPHOS 143*  BILITOT 0.9  PROT 6.9  ALBUMIN 3.4*   Recent Labs    12/07/21 2032  LIPASE 26   Cardiac Enzymes No results for input(s): "CKTOTAL", "CKMB", "CKMBINDEX", "TROPONINI" in the last 72 hours.  BNP: BNP (last 3 results) Recent Labs    10/05/21 0530 11/10/21 1824 12/07/21 1130  BNP 655.0* 319.8* 3,127.4*    ProBNP (last 3 results) Recent Labs    02/03/21 0911  PROBNP 3,012.0*     D-Dimer No results for input(s): "DDIMER" in the last 72 hours. Hemoglobin A1C No results for input(s): "HGBA1C" in the last 72 hours. Fasting Lipid Panel No results for input(s): "CHOL", "HDL", "LDLCALC", "TRIG", "CHOLHDL", "LDLDIRECT" in the last 72 hours. Thyroid Function Tests No results for input(s): "TSH", "T4TOTAL", "T3FREE", "THYROIDAB" in the last 72 hours.  Invalid input(s): "FREET3"  Other results:   Imaging    No results found.   Medications:     Scheduled Medications:  aspirin EC  81 mg Oral Daily   atorvastatin  80 mg Oral Daily   carvedilol  6.25 mg Oral BID WC     enoxaparin (LOVENOX) injection  40 mg Subcutaneous Q24H   hydrALAZINE  50 mg Oral Q8H   isosorbide mononitrate  30 mg Oral Daily   lidocaine  1 patch Transdermal Q24H   methocarbamol  500 mg Oral BID   sacubitril-valsartan  1 tablet Oral BID   sodium chloride flush  3 mL Intravenous Q12H   spironolactone  25 mg Oral Daily   Tafamidis  61 mg Oral Daily    Infusions:  sodium chloride      PRN Medications: sodium chloride, acetaminophen, ondansetron (ZOFRAN) IV, senna-docusate, sodium chloride flush, traMADol    Patient Profile   Mr. Bartnik is a 61 y.o. male with history of CAD with prior MI and stent in 01/2019 at [facility] in [place] (report not available), cardiomyopathy/chronic systolic  HF, uncontrolled DM II, HTN, hx CVA, CKD. Admitted yesterday, was seen in HF APP clinic and was advised to go to ED but went home and had a fall. Here with A/C Systolic HF.    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 He reports cardiomyopathy diagnosed just prior to MI while living in [place] a couple of years ago. - Echo (10/22): EF 20-25%, RV ok - R/LHC (10/22): Patent RPLV stent with nonobstructive disease elsewhere, Preserved CO/CI, RA mean 5 mmHg, PCWP mean 5 mmHg - cMRI (11/22): LVEF 24% RVEF 21% LGE and ECW suggestive of cardiac amyloidosis. 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 I thought equivocal but based on cMRI likely positive Will need genetic testing. - Multiple myeloma panel (12/22) with no m-spike. - ECHO (6/23) LVEF 30-35%, LV global hypokinesis, Grade 1 DD, RV function normal - Presented with NYHA III, physically limited by neuropathy in feet. Presented volume overloaded, REDs 37% 7/18. On admission: BNP 3127. Acute exacerbation likely secondary to noncompliance with meds. Was out for about 2 months.  - Volume status improved, appears euvolemic. Overall diuresed 7lbs. Hold diuretics today in setting of AKI. ReDS clip 28%.  - Continue hydralazine 50 mg tid and Imdur 30 mg daily.  - Hold Entresto 97/103 mg bid in setting of AKI.  - Continue carvedilol 6.25 mg bid.  - Hold spironolactone 25 mg daily in setting of AKI - No SGLT2i w/ A1c 12. - Await genetic testing.   - Continue tafamadis, and will likely benefit from Zapata as well - Daily weights, strict I&O   2. CAD - Chest pain on admission, now denies CP. HsTrop now flat HsTrop 20>42>18>18.  - Prior MI followed by PCI in [place] in either 2019 or 2020.  - Patent RPLV stent on [facility] 10/22. Also had 20% distal RCA, 30% p LAD, 70% d LAD, 60% OM2 - Continue ASA  - Stent placed 2020. >42yrsince stent placement, could consider stopping Plavix d/t issues with noncompliance. -  Continue atorvastatin 80 mg daily. - No chest pain.    3. HTN -  Continue hydralazine TID   4. Uncontrolled DM - Needs PCP follow up. - On Januvia. - No SGLT2i with A1c 12.   5. PVCs - Rare   6. Neuropathy - This seems to be his biggest limiter. - On gabapentin. - Referred to Dr. PPosey Pronto[facility]. Consider Amvuttra.   7. SDOH - He has Medicaid and disability. - Transportation is an issue, he has a car but unable to drive (needs license). - 120year old son helps with meds. - Paramedicine to follow outpatient for medication compliance.  8. Mechanical fall -  X-ray of lumbars ruled out fracture - PT/OT to follow - Can add ultram.   9. AKI - Suspect in the setting of over diuresis - Cr >> 1.2 >>1.3>>1.3>>1.75 - Hold diuretics, Entresto, and Spiro  10. NSVT - Had 8 beat run overnight, patient asymptomatic - K 4.4, Mg 1.9, will repleat w/ 2gm Mg - Daily BMET  Discharge to home today, will give all home medications prior to d/c. Does not have transportation, TOC team to help with transportation home. Will refer to HF paramedicine.   Length of Stay: 2 Alma L Diaz AGACNP-BC  12/10/2021, 8:08 AM  Advanced Heart Failure Team Pager [phone] (M-F; 7a - 5p)  Please contact [facility] for night-coverage after hours (5p -7a ) and weekends on [email or web address]  Patient seen and examined with the above-signed Advanced Practice Provider and/or Housestaff. I personally reviewed laboratory data, imaging studies and relevant notes. I independently examined the patient and formulated the important aspects of the plan. I have edited the note to reflect any of my changes or salient points. I have personally discussed the plan with the patient and/or family.  Breathing better. No orthopnea or PND. SCr up slightly ReDs 28%   Back pain improved.   General:  Sitting up in bed . No resp difficulty HEENT: normal Neck: supple. no JVD. Carotids 2+ bilat; no bruits. No lymphadenopathy or  thryomegaly appreciated. Cor: PMI nondisplaced. Regular rate & rhythm. No rubs, gallops or murmurs. Lungs: clear Abdomen: soft, nontender, nondistended. No hepatosplenomegaly. No bruits or masses. Good bowel sounds. Extremities: no cyanosis, clubbing, rash, edema Neuro: alert & orientedx3, cranial nerves grossly intact. moves all 4 extremities w/o difficulty. Affect pleasant  Volume status looks very good. I think this is as good as we can get him from HF perspective. Ok for d/c home today with outpatient f/u. Meds reviewed with PharmD.   Daniel Bensimhon, MD  11:00 AM    

## 2021-12-10 NOTE — Progress Notes (Signed)
Patient given discharge instructions and stated understanding. 

## 2021-12-10 NOTE — Progress Notes (Signed)
Pt had 8bts NSVT. Denies lightheadedness, no dizziness. BP 101/77 (BP Location: Left Arm)   Pulse 86   Temp 97.6 F (36.4 C) (Oral)   Resp 10   Ht 5\' 9"  (1.753 m)   Wt 68.9 kg   SpO2 97%   BMI 22.45 kg/m

## 2021-12-10 NOTE — Progress Notes (Signed)
Reds clip-28%

## 2021-12-11 ENCOUNTER — Other Ambulatory Visit (HOSPITAL_COMMUNITY): Payer: Self-pay | Admitting: Emergency Medicine

## 2021-12-11 ENCOUNTER — Telehealth (HOSPITAL_COMMUNITY): Payer: Self-pay

## 2021-12-11 ENCOUNTER — Ambulatory Visit: Payer: Medicare Other | Admitting: Diagnostic Neuroimaging

## 2021-12-11 ENCOUNTER — Telehealth: Payer: Self-pay | Admitting: Family Medicine

## 2021-12-11 ENCOUNTER — Telehealth (HOSPITAL_COMMUNITY): Payer: Self-pay | Admitting: Emergency Medicine

## 2021-12-11 DIAGNOSIS — Z91128 Patient's intentional underdosing of medication regimen for other reason: Secondary | ICD-10-CM | POA: Diagnosis not present

## 2021-12-11 DIAGNOSIS — Z9181 History of falling: Secondary | ICD-10-CM | POA: Diagnosis not present

## 2021-12-11 DIAGNOSIS — E1140 Type 2 diabetes mellitus with diabetic neuropathy, unspecified: Secondary | ICD-10-CM | POA: Diagnosis not present

## 2021-12-11 DIAGNOSIS — E1122 Type 2 diabetes mellitus with diabetic chronic kidney disease: Secondary | ICD-10-CM | POA: Diagnosis not present

## 2021-12-11 DIAGNOSIS — E1165 Type 2 diabetes mellitus with hyperglycemia: Secondary | ICD-10-CM | POA: Diagnosis not present

## 2021-12-11 DIAGNOSIS — I5023 Acute on chronic systolic (congestive) heart failure: Secondary | ICD-10-CM | POA: Diagnosis not present

## 2021-12-11 DIAGNOSIS — I130 Hypertensive heart and chronic kidney disease with heart failure and stage 1 through stage 4 chronic kidney disease, or unspecified chronic kidney disease: Secondary | ICD-10-CM | POA: Diagnosis not present

## 2021-12-11 DIAGNOSIS — Z87891 Personal history of nicotine dependence: Secondary | ICD-10-CM | POA: Diagnosis not present

## 2021-12-11 DIAGNOSIS — Z8673 Personal history of transient ischemic attack (TIA), and cerebral infarction without residual deficits: Secondary | ICD-10-CM | POA: Diagnosis not present

## 2021-12-11 DIAGNOSIS — I252 Old myocardial infarction: Secondary | ICD-10-CM | POA: Diagnosis not present

## 2021-12-11 DIAGNOSIS — W19XXXD Unspecified fall, subsequent encounter: Secondary | ICD-10-CM | POA: Diagnosis not present

## 2021-12-11 DIAGNOSIS — N189 Chronic kidney disease, unspecified: Secondary | ICD-10-CM | POA: Diagnosis not present

## 2021-12-11 DIAGNOSIS — Z7982 Long term (current) use of aspirin: Secondary | ICD-10-CM | POA: Diagnosis not present

## 2021-12-11 DIAGNOSIS — I2510 Atherosclerotic heart disease of native coronary artery without angina pectoris: Secondary | ICD-10-CM | POA: Diagnosis not present

## 2021-12-11 DIAGNOSIS — E8582 Wild-type transthyretin-related (ATTR) amyloidosis: Secondary | ICD-10-CM | POA: Diagnosis not present

## 2021-12-11 NOTE — Telephone Encounter (Signed)
Called and spoke with pt.  Making a home visit this morning around 9:00

## 2021-12-11 NOTE — Telephone Encounter (Signed)
Called to confirm/remind patient of their appointment at the [facility] on 12/14/21.   Patient reminded to bring all medications and/or complete list.  Confirmed patient has transportation. Gave directions, instructed to utilize valet parking.  Confirmed appointment prior to ending call.   

## 2021-12-11 NOTE — Telephone Encounter (Signed)
You haven't seen him since 01/2021. Not sure if you want to approve these or wait until you see him 8/1?

## 2021-12-11 NOTE — Telephone Encounter (Signed)
Medication education and disease mgmt-1x6

## 2021-12-11 NOTE — Progress Notes (Signed)
Paramedicine Encounter    Patient ID: Thomas West, male    DOB: [birth year], 61 y.o.   MRN: [id number]   BP 110/80 (BP Location: Left Arm, Patient Position: Sitting, Cuff Size: Normal)   Pulse 90   Resp 16   Wt 146 lb 6.4 oz (66.4 kg)   SpO2 94%   BMI 21.62 kg/m  Weight yesterday-not taken  Last visit weight-n/a  Initial home visit.  Pt reports to be feeling good.  His son lives with him and helps him with his meds.  He is currently taking his pills from the bottles.  He desires for me to help him set them up in a pill box. Each med reviewed with pt and son and they both appear well versed in when and how much he takes.   He reports he weighs daily.  Has no complaints of chest pain or SOB.  Lung sound clear and equal bilat.  No edema or JVD noted.  Will schedule weekly visits.  Home visit complete.   Patient Care Team: [name], [name], MD as PCP - General (Family Medicine) Wendall Stade, MD as PCP - Cardiology (Cardiology)  Patient Active Problem List   Diagnosis Date Noted  . Heart failure (HCC) 12/08/2021  . Hypertension associated with diabetes (HCC) 07/12/2021  . Chronic kidney disease, stage 3a (HCC) 07/12/2021  . Hyperkalemia 07/12/2021  . CHF exacerbation (HCC) 04/20/2021  . Acute on chronic combined systolic and diastolic CHF (congestive heart failure) (HCC) 03/09/2021  . Acute on chronic HFrEF (heart failure with reduced ejection fraction) (HCC) 03/08/2021  . HFrEF (heart failure with reduced ejection fraction) (HCC) 02/03/2021  . Peripheral neuropathy 02/03/2021  . Diabetes mellitus (HCC) 03/27/2019  . Hyperlipidemia associated with type 2 diabetes mellitus (HCC) 03/27/2019  . CAD (coronary artery disease) 03/05/2019  . History of MI (myocardial infarction) 03/05/2019  . Type 2 diabetes mellitus with diabetic neuropathy, unspecified (HCC) 03/05/2019  . CKD (chronic kidney disease), stage II 03/05/2019  . HLD (hyperlipidemia) 03/05/2019  . GERD (gastroesophageal  reflux disease) 03/05/2019  . Hypertension, essential, benign 03/05/2019    Current Outpatient Medications:  .  aspirin EC 81 MG tablet, Take 1 tablet (81 mg total) by mouth daily. Swallow whole., Disp: 30 tablet, Rfl: 12 .  atorvastatin (LIPITOR) 80 MG tablet, Take 1 tablet (80 mg total) by mouth daily., Disp: 30 tablet, Rfl: 6 .  carvedilol (COREG) 6.25 MG tablet, Take 1 tablet (6.25 mg total) by mouth EVERY 12 HOURS (10AM &10PM), Disp: 60 tablet, Rfl: 6 .  furosemide (LASIX) 40 MG tablet, Take 1 tablet (40 mg total) by mouth daily., Disp: 30 tablet, Rfl: 6 .  hydrALAZINE (APRESOLINE) 50 MG tablet, Take 1 tablet (50 mg total) by mouth 3 (three) times daily., Disp: 90 tablet, Rfl: 6 .  hydrocortisone cream 1 %, Apply to affected area 2 times daily, Disp: 28 g, Rfl: 0 .  isosorbide mononitrate (IMDUR) 30 MG 24 hr tablet, Take 1 tablet (30 mg total) by mouth daily., Disp: 30 tablet, Rfl: 6 .  sacubitril-valsartan (ENTRESTO) 97-103 MG, Take 1 tablet by mouth 2 (two) times daily., Disp: 60 tablet, Rfl: 6 .  spironolactone (ALDACTONE) 25 MG tablet, Take 1 tablet (25 mg total) by mouth daily., Disp: 30 tablet, Rfl: 6 .  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, Take 1 capsule by mouth daily., Disp: 30 capsule, Rfl: 11 Allergies  Allergen Reactions  . Bee Venom     swelling      Social History  Socioeconomic History  . Marital status: Single    Spouse name: Not on file  . Number of children: Not on file  . Years of education: Not on file  . Highest education level: Not on file  Occupational History  . Not on file  Tobacco Use  . Smoking status: Former    Types: Cigarettes    Quit date: 10/06/2017    Years since quitting: 4.1  . Smokeless tobacco: Never  Substance and Sexual Activity  . Alcohol use: Not Currently  . Drug use: Yes    Types: Marijuana  . Sexual activity: Not on file  Other Topics Concern  . Not on file  Social History Narrative  . Not on file   Social Determinants of Health    Financial Resource Strain: Low Risk  (03/10/2021)   Overall Financial Resource Strain (CARDIA)   . Difficulty of Paying Living Expenses: Not very hard  Food Insecurity: Food Insecurity Present (12/07/2021)   Hunger Vital Sign   . Worried About Programme researcher, broadcasting/film/video in the Last Year: Sometimes true   . Ran Out of Food in the Last Year: Never true  Transportation Needs: Unmet Transportation Needs (08/25/2021)   PRAPARE - Transportation   . Lack of Transportation (Medical): Yes   . Lack of Transportation (Non-Medical): Yes  Physical Activity: Not on file  Stress: Not on file  Social Connections: Not on file  Intimate Partner Violence: Not on file    Physical Exam      Future Appointments  Date Time Provider Department Center  12/14/2021  1:30 PM [facility] PA/NP [facility] None  12/22/2021 11:00 AM [name], [name], MD [facility]  12/28/2021  3:00 PM Suanne Marker, MD [facility] None  01/04/2022  9:30 AM [facility] PA/NP [facility] None       Beatrix Shipper, EMT-Paramedic [phone] [facility] Paramedic  12/11/21

## 2021-12-12 ENCOUNTER — Inpatient Hospital Stay (HOSPITAL_COMMUNITY)
Admission: EM | Admit: 2021-12-12 | Discharge: 2021-12-15 | DRG: 683 | Disposition: A | Discharge status: Home Health | Payer: Medicare Other | Attending: Family Medicine | Admitting: Family Medicine

## 2021-12-12 ENCOUNTER — Emergency Department (HOSPITAL_COMMUNITY): Payer: Medicare Other

## 2021-12-12 ENCOUNTER — Other Ambulatory Visit: Payer: Self-pay

## 2021-12-12 ENCOUNTER — Encounter (HOSPITAL_COMMUNITY): Payer: Self-pay | Admitting: Emergency Medicine

## 2021-12-12 DIAGNOSIS — I2510 Atherosclerotic heart disease of native coronary artery without angina pectoris: Secondary | ICD-10-CM | POA: Diagnosis not present

## 2021-12-12 DIAGNOSIS — Z20822 Contact with and (suspected) exposure to covid-19: Secondary | ICD-10-CM | POA: Diagnosis not present

## 2021-12-12 DIAGNOSIS — I43 Cardiomyopathy in diseases classified elsewhere: Secondary | ICD-10-CM | POA: Diagnosis present

## 2021-12-12 DIAGNOSIS — R0789 Other chest pain: Secondary | ICD-10-CM | POA: Diagnosis not present

## 2021-12-12 DIAGNOSIS — Z5982 Transportation insecurity: Secondary | ICD-10-CM | POA: Diagnosis not present

## 2021-12-12 DIAGNOSIS — N179 Acute kidney failure, unspecified: Secondary | ICD-10-CM | POA: Diagnosis not present

## 2021-12-12 DIAGNOSIS — Z79899 Other long term (current) drug therapy: Secondary | ICD-10-CM | POA: Diagnosis not present

## 2021-12-12 DIAGNOSIS — E854 Organ-limited amyloidosis: Secondary | ICD-10-CM | POA: Diagnosis present

## 2021-12-12 DIAGNOSIS — R0602 Shortness of breath: Secondary | ICD-10-CM | POA: Diagnosis not present

## 2021-12-12 DIAGNOSIS — R739 Hyperglycemia, unspecified: Secondary | ICD-10-CM | POA: Diagnosis not present

## 2021-12-12 DIAGNOSIS — E1159 Type 2 diabetes mellitus with other circulatory complications: Secondary | ICD-10-CM | POA: Diagnosis present

## 2021-12-12 DIAGNOSIS — R197 Diarrhea, unspecified: Secondary | ICD-10-CM | POA: Diagnosis present

## 2021-12-12 DIAGNOSIS — Z5941 Food insecurity: Secondary | ICD-10-CM

## 2021-12-12 DIAGNOSIS — I5020 Unspecified systolic (congestive) heart failure: Secondary | ICD-10-CM | POA: Diagnosis not present

## 2021-12-12 DIAGNOSIS — I130 Hypertensive heart and chronic kidney disease with heart failure and stage 1 through stage 4 chronic kidney disease, or unspecified chronic kidney disease: Secondary | ICD-10-CM | POA: Diagnosis not present

## 2021-12-12 DIAGNOSIS — I152 Hypertension secondary to endocrine disorders: Secondary | ICD-10-CM | POA: Diagnosis not present

## 2021-12-12 DIAGNOSIS — I252 Old myocardial infarction: Secondary | ICD-10-CM

## 2021-12-12 DIAGNOSIS — N1831 Chronic kidney disease, stage 3a: Secondary | ICD-10-CM | POA: Diagnosis present

## 2021-12-12 DIAGNOSIS — E860 Dehydration: Secondary | ICD-10-CM | POA: Diagnosis not present

## 2021-12-12 DIAGNOSIS — I493 Ventricular premature depolarization: Secondary | ICD-10-CM | POA: Diagnosis not present

## 2021-12-12 DIAGNOSIS — I5022 Chronic systolic (congestive) heart failure: Secondary | ICD-10-CM | POA: Diagnosis present

## 2021-12-12 DIAGNOSIS — R079 Chest pain, unspecified: Secondary | ICD-10-CM | POA: Diagnosis not present

## 2021-12-12 DIAGNOSIS — E1140 Type 2 diabetes mellitus with diabetic neuropathy, unspecified: Secondary | ICD-10-CM | POA: Diagnosis present

## 2021-12-12 DIAGNOSIS — Z955 Presence of coronary angioplasty implant and graft: Secondary | ICD-10-CM

## 2021-12-12 DIAGNOSIS — R109 Unspecified abdominal pain: Secondary | ICD-10-CM | POA: Diagnosis not present

## 2021-12-12 DIAGNOSIS — Z91030 Bee allergy status: Secondary | ICD-10-CM | POA: Diagnosis not present

## 2021-12-12 DIAGNOSIS — E1122 Type 2 diabetes mellitus with diabetic chronic kidney disease: Secondary | ICD-10-CM | POA: Diagnosis not present

## 2021-12-12 DIAGNOSIS — Z8673 Personal history of transient ischemic attack (TIA), and cerebral infarction without residual deficits: Secondary | ICD-10-CM

## 2021-12-12 DIAGNOSIS — Z87891 Personal history of nicotine dependence: Secondary | ICD-10-CM | POA: Diagnosis not present

## 2021-12-12 DIAGNOSIS — E1165 Type 2 diabetes mellitus with hyperglycemia: Secondary | ICD-10-CM | POA: Diagnosis not present

## 2021-12-12 DIAGNOSIS — T502X5A Adverse effect of carbonic-anhydrase inhibitors, benzothiadiazides and other diuretics, initial encounter: Secondary | ICD-10-CM | POA: Diagnosis present

## 2021-12-12 DIAGNOSIS — R112 Nausea with vomiting, unspecified: Secondary | ICD-10-CM | POA: Diagnosis not present

## 2021-12-12 DIAGNOSIS — R609 Edema, unspecified: Secondary | ICD-10-CM | POA: Diagnosis not present

## 2021-12-12 LAB — CBC
HCT: 48.1 % (ref 39.0–52.0)
Hemoglobin: 15.7 g/dL (ref 13.0–17.0)
MCH: 28.1 pg (ref 26.0–34.0)
MCHC: 32.6 g/dL (ref 30.0–36.0)
MCV: 86 fL (ref 80.0–100.0)
Platelets: 514 10*3/uL — ABNORMAL HIGH (ref 150–400)
RBC: 5.59 MIL/uL (ref 4.22–5.81)
RDW: 15 % (ref 11.5–15.5)
WBC: 7.4 10*3/uL (ref 4.0–10.5)
nRBC: 0 % (ref 0.0–0.2)

## 2021-12-12 LAB — BASIC METABOLIC PANEL
Anion gap: 12 (ref 5–15)
BUN: 41 mg/dL — ABNORMAL HIGH (ref 6–20)
CO2: 26 mmol/L (ref 22–32)
Calcium: 9.3 mg/dL (ref 8.9–10.3)
Chloride: 97 mmol/L — ABNORMAL LOW (ref 98–111)
Creatinine, Ser: 3.34 mg/dL — ABNORMAL HIGH (ref 0.61–1.24)
GFR, Estimated: 21 mL/min — ABNORMAL LOW (ref >60)
Glucose, Bld: 184 mg/dL — ABNORMAL HIGH (ref 70–99)
Potassium: 5 mmol/L (ref 3.5–5.1)
Sodium: 135 mmol/L (ref 135–145)

## 2021-12-12 LAB — TROPONIN I (HIGH SENSITIVITY)
Troponin I (High Sensitivity): 14 ng/L (ref <18)
Troponin I (High Sensitivity): 15 ng/L (ref <18)

## 2021-12-12 MED ORDER — SODIUM CHLORIDE 0.9 % IV BOLUS
500.0000 mL | Freq: Once | INTRAVENOUS | Status: AC
  Administered 2021-12-13: 500 mL via INTRAVENOUS

## 2021-12-12 NOTE — ED Notes (Signed)
Attempted to obtain BP in both arms, best reading was 214/189. Patient had been up to the bathroom and then back to bed. Dr. Wallace Cullens at bedside to assess patient and made aware.

## 2021-12-12 NOTE — ED Provider Triage Note (Signed)
Emergency Medicine Provider Triage Evaluation Note  Thomas West , a 61 y.o. male  was evaluated in triage.  Pt complains of n/v/d, chest pain, onset this morning after taking new meds from his last admission.  Review of Systems  Positive: Cp, v/d Negative: fever  Physical Exam  BP 108/78   Pulse 87   Temp 97.8 F (36.6 C) (Oral)   Resp 18   SpO2 98%  Gen:   Awake, no distress   Resp:  Normal effort  MSK:   Moves extremities without difficulty  Other:    Medical Decision Making  Medically screening exam initiated at 4:37 PM.  Appropriate orders placed.  Thomas West was informed that the remainder of the evaluation will be completed by another provider, this initial triage assessment does not replace that evaluation, and the importance of remaining in the ED until their evaluation is complete.     [name], PA-C 12/12/21 1637

## 2021-12-12 NOTE — ED Provider Notes (Signed)
[facility] EMERGENCY DEPARTMENT Provider Note   CSN: [id number] Arrival date & time: 12/12/21  1611     History {Add pertinent medical, surgical, social history, OB history to HPI:1} Chief Complaint  Patient presents with   Chest Pain    Thomas West is a 61 y.o. male.  Patient as above with significant medical history as below, including CHF, CAD, DM, HTN who presents to the ED with complaint of chest pain  Patient was admitted 7/17 secondary to HFrEF exacerbation associated with medication noncompliance.  He was started on Lasix and metolazone.  Discharged 7/20 in stable condition     Past Medical History:  Diagnosis Date   CHF (congestive heart failure) (HCC)    Coronary artery disease    Diabetes mellitus without complication (HCC)    History of MI (myocardial infarction) 03/05/2019   Hypertension    Stroke [facility])     Past Surgical History:  Procedure Laterality Date   leg surgery     "pins in left shin"   RIGHT/LEFT HEART CATH AND CORONARY ANGIOGRAPHY N/A 03/11/2021   Procedure: RIGHT/LEFT HEART CATH AND CORONARY ANGIOGRAPHY;  Surgeon: Orbie Pyo, MD;  Location: [facility] INVASIVE CV LAB;  Service: Cardiovascular;  Laterality: N/A;     The history is provided by the patient. No language interpreter was used.  Chest Pain      Home Medications Prior to Admission medications   Medication Sig Start Date End Date Taking? Authorizing Provider  aspirin EC 81 MG tablet Take 1 tablet (81 mg total) by mouth daily. Swallow whole. 12/11/21   Alen Bleacher, NP  atorvastatin (LIPITOR) 80 MG tablet Take 1 tablet (80 mg total) by mouth daily. 12/10/21 07/08/22  Alen Bleacher, NP  carvedilol (COREG) 6.25 MG tablet Take 1 tablet (6.25 mg total) by mouth EVERY 12 HOURS (10AM &10PM) 12/10/21 07/08/22  Alen Bleacher, NP  furosemide (LASIX) 40 MG tablet Take 1 tablet (40 mg total) by mouth daily. 12/11/21 07/09/22  Alen Bleacher, NP  hydrALAZINE (APRESOLINE) 50 MG tablet  Take 1 tablet (50 mg total) by mouth 3 (three) times daily. 12/10/21 07/08/22  Alen Bleacher, NP  hydrocortisone cream 1 % Apply to affected area 2 times daily 12/10/21   Alen Bleacher, NP  isosorbide mononitrate (IMDUR) 30 MG 24 hr tablet Take 1 tablet (30 mg total) by mouth daily. 12/10/21   Alen Bleacher, NP  sacubitril-valsartan (ENTRESTO) 97-103 MG Take 1 tablet by mouth 2 (two) times daily. 12/11/21   Alen Bleacher, NP  spironolactone (ALDACTONE) 25 MG tablet Take 1 tablet (25 mg total) by mouth daily. 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 09/02/21   Bensimhon, Bevelyn Buckles, MD      Allergies    Bee venom    Review of Systems   Review of Systems  Cardiovascular:  Positive for chest pain.    Physical Exam Updated Vital Signs BP 119/80 (BP Location: Left Arm)   Pulse 87   Temp 98.1 F (36.7 C) (Oral)   Resp 20   SpO2 98%  Physical Exam  ED Results / Procedures / Treatments   Labs (all labs ordered are listed, but only abnormal results are displayed) Labs Reviewed  BASIC METABOLIC PANEL - Abnormal; Notable for the following components:      Result Value   Chloride 97 (*)    Glucose, Bld 184 (*)  BUN 41 (*)    Creatinine, Ser 3.34 (*)    GFR, Estimated 21 (*)    All other components within normal limits  CBC - Abnormal; Notable for the following components:   Platelets 514 (*)    All other components within normal limits  TROPONIN I (HIGH SENSITIVITY)  TROPONIN I (HIGH SENSITIVITY)    EKG None  Radiology DG Chest 2 View  Result Date: 12/12/2021 CLINICAL DATA:  Shortness of breath EXAM: CHEST - 2 VIEW COMPARISON:  12/07/2021 FINDINGS: Normal heart size and pulmonary vascularity. No focal airspace disease or consolidation in the lungs. No blunting of costophrenic angles. No pneumothorax. Mediastinal contours appear intact. Calcification of the aorta. IMPRESSION: No active cardiopulmonary disease. Electronically Signed   By:  Burman Nieves M.D.   On: 12/12/2021 17:43    Procedures Procedures  {Document cardiac monitor, telemetry assessment procedure when appropriate:1}  Medications Ordered in ED Medications - No data to display  ED Course/ Medical Decision Making/ A&P                           Medical Decision Making Amount and/or Complexity of Data Reviewed Labs: ordered. Radiology: ordered.    CC: ***  This patient presents to the Emergency Department for the above complaint. This involves an extensive number of treatment options and is a complaint that carries with it a high risk of complications and morbidity. Vital signs were reviewed. Serious etiologies considered.  Record review:  Previous records obtained and reviewed ***  Additional history obtained from ***  Medical and surgical history as noted above.   Work up as above, notable for:  Labs & imaging results that were available during my care of the patient were visualized by me and considered in my medical decision making.  Physical exam as above.   I ordered imaging studies which included ***. I visualized the imaging, interpreted images, and I agree with radiologist interpretation. ***  Cardiac monitoring reviewed and interpreted personally which shows ***   Personally discussed patient care with consultant; ***  Management: ***  ED Course:     Reassessment:  ***  Admission was considered.                Social determinants of health include -  Social History   Socioeconomic History   Marital status: Single    Spouse name: Not on file   Number of children: Not on file   Years of education: Not on file   Highest education level: Not on file  Occupational History   Not on file  Tobacco Use   Smoking status: Former    Types: Cigarettes    Quit date: 10/06/2017    Years since quitting: 4.1   Smokeless tobacco: Never  Substance and Sexual Activity   Alcohol use: Not Currently   Drug use: Yes     Types: Marijuana   Sexual activity: Not on file  Other Topics Concern   Not on file  Social History Narrative   Not on file   Social Determinants of Health   Financial Resource Strain: Low Risk  (03/10/2021)   Overall Financial Resource Strain (CARDIA)    Difficulty of Paying Living Expenses: Not very hard  Food Insecurity: Food Insecurity Present (12/07/2021)   Hunger Vital Sign    Worried About Running Out of Food in the Last Year: Sometimes true    Ran Out of Food in the Last Year: Never  true  Transportation Needs: Unmet Transportation Needs (08/25/2021)   PRAPARE - Hydrologist (Medical): Yes    Lack of Transportation (Non-Medical): Yes  Physical Activity: Not on file  Stress: Not on file  Social Connections: Not on file  Intimate Partner Violence: Not on file      This chart was dictated using voice recognition software.  Despite best efforts to proofread,  errors can occur which can change the documentation meaning.   {Document critical care time when appropriate:1} {Document review of labs and clinical decision tools ie heart score, Chads2Vasc2 etc:1}  {Document your independent review of radiology images, and any outside records:1} {Document your discussion with family members, caretakers, and with consultants:1} {Document social determinants of health affecting pt's care:1} {Document your decision making why or why not admission, treatments were needed:1} Final Clinical Impression(s) / ED Diagnoses Final diagnoses:  None    Rx / DC Orders ED Discharge Orders     None

## 2021-12-12 NOTE — ED Triage Notes (Signed)
[name] from home with complaint of exertional chest pain and shortness of breath as well as diarrhea that started this morning. Patient seen recently for CHF exacerbation and prescribed lasix, patient states he is concerned that is lasix dose is causing his symptoms.

## 2021-12-13 ENCOUNTER — Emergency Department (HOSPITAL_COMMUNITY): Payer: Medicare Other

## 2021-12-13 DIAGNOSIS — I493 Ventricular premature depolarization: Secondary | ICD-10-CM | POA: Diagnosis not present

## 2021-12-13 DIAGNOSIS — Z87891 Personal history of nicotine dependence: Secondary | ICD-10-CM | POA: Diagnosis not present

## 2021-12-13 DIAGNOSIS — Z5941 Food insecurity: Secondary | ICD-10-CM | POA: Diagnosis not present

## 2021-12-13 DIAGNOSIS — N1831 Chronic kidney disease, stage 3a: Secondary | ICD-10-CM | POA: Diagnosis present

## 2021-12-13 DIAGNOSIS — I5022 Chronic systolic (congestive) heart failure: Secondary | ICD-10-CM | POA: Diagnosis present

## 2021-12-13 DIAGNOSIS — Z5982 Transportation insecurity: Secondary | ICD-10-CM | POA: Diagnosis not present

## 2021-12-13 DIAGNOSIS — E1140 Type 2 diabetes mellitus with diabetic neuropathy, unspecified: Secondary | ICD-10-CM | POA: Diagnosis present

## 2021-12-13 DIAGNOSIS — I130 Hypertensive heart and chronic kidney disease with heart failure and stage 1 through stage 4 chronic kidney disease, or unspecified chronic kidney disease: Secondary | ICD-10-CM | POA: Diagnosis present

## 2021-12-13 DIAGNOSIS — N179 Acute kidney failure, unspecified: Secondary | ICD-10-CM | POA: Diagnosis present

## 2021-12-13 DIAGNOSIS — E860 Dehydration: Secondary | ICD-10-CM | POA: Diagnosis present

## 2021-12-13 DIAGNOSIS — Z955 Presence of coronary angioplasty implant and graft: Secondary | ICD-10-CM | POA: Diagnosis not present

## 2021-12-13 DIAGNOSIS — Z91030 Bee allergy status: Secondary | ICD-10-CM | POA: Diagnosis not present

## 2021-12-13 DIAGNOSIS — R112 Nausea with vomiting, unspecified: Secondary | ICD-10-CM

## 2021-12-13 DIAGNOSIS — R079 Chest pain, unspecified: Secondary | ICD-10-CM

## 2021-12-13 DIAGNOSIS — T502X5A Adverse effect of carbonic-anhydrase inhibitors, benzothiadiazides and other diuretics, initial encounter: Secondary | ICD-10-CM | POA: Diagnosis present

## 2021-12-13 DIAGNOSIS — Z79899 Other long term (current) drug therapy: Secondary | ICD-10-CM | POA: Diagnosis not present

## 2021-12-13 DIAGNOSIS — Z20822 Contact with and (suspected) exposure to covid-19: Secondary | ICD-10-CM | POA: Diagnosis present

## 2021-12-13 DIAGNOSIS — R0602 Shortness of breath: Secondary | ICD-10-CM

## 2021-12-13 DIAGNOSIS — E1165 Type 2 diabetes mellitus with hyperglycemia: Secondary | ICD-10-CM | POA: Diagnosis not present

## 2021-12-13 DIAGNOSIS — R197 Diarrhea, unspecified: Secondary | ICD-10-CM

## 2021-12-13 DIAGNOSIS — I2510 Atherosclerotic heart disease of native coronary artery without angina pectoris: Secondary | ICD-10-CM | POA: Diagnosis present

## 2021-12-13 DIAGNOSIS — E854 Organ-limited amyloidosis: Secondary | ICD-10-CM | POA: Diagnosis present

## 2021-12-13 DIAGNOSIS — I43 Cardiomyopathy in diseases classified elsewhere: Secondary | ICD-10-CM | POA: Diagnosis present

## 2021-12-13 DIAGNOSIS — I5020 Unspecified systolic (congestive) heart failure: Secondary | ICD-10-CM | POA: Diagnosis not present

## 2021-12-13 DIAGNOSIS — I252 Old myocardial infarction: Secondary | ICD-10-CM | POA: Diagnosis not present

## 2021-12-13 DIAGNOSIS — E1122 Type 2 diabetes mellitus with diabetic chronic kidney disease: Secondary | ICD-10-CM | POA: Diagnosis present

## 2021-12-13 DIAGNOSIS — Z8673 Personal history of transient ischemic attack (TIA), and cerebral infarction without residual deficits: Secondary | ICD-10-CM | POA: Diagnosis not present

## 2021-12-13 LAB — HEMOGLOBIN A1C
Hgb A1c MFr Bld: 7.3 % — ABNORMAL HIGH (ref 4.8–5.6)
Mean Plasma Glucose: 162.81 mg/dL

## 2021-12-13 LAB — BASIC METABOLIC PANEL
Anion gap: 9 (ref 5–15)
BUN: 42 mg/dL — ABNORMAL HIGH (ref 6–20)
CO2: 24 mmol/L (ref 22–32)
Calcium: 8.5 mg/dL — ABNORMAL LOW (ref 8.9–10.3)
Chloride: 103 mmol/L (ref 98–111)
Creatinine, Ser: 2.65 mg/dL — ABNORMAL HIGH (ref 0.61–1.24)
GFR, Estimated: 27 mL/min — ABNORMAL LOW (ref >60)
Glucose, Bld: 235 mg/dL — ABNORMAL HIGH (ref 70–99)
Potassium: 4.3 mmol/L (ref 3.5–5.1)
Sodium: 136 mmol/L (ref 135–145)

## 2021-12-13 LAB — HEPATIC FUNCTION PANEL
ALT: 20 U/L (ref 0–44)
AST: 25 U/L (ref 15–41)
Albumin: 4.1 g/dL (ref 3.5–5.0)
Alkaline Phosphatase: 117 U/L (ref 38–126)
Bilirubin, Direct: 0.1 mg/dL (ref 0.0–0.2)
Indirect Bilirubin: 0.3 mg/dL (ref 0.3–0.9)
Total Bilirubin: 0.4 mg/dL (ref 0.3–1.2)
Total Protein: 8.3 g/dL — ABNORMAL HIGH (ref 6.5–8.1)

## 2021-12-13 LAB — CBC
HCT: 43.3 % (ref 39.0–52.0)
Hemoglobin: 14.2 g/dL (ref 13.0–17.0)
MCH: 28.3 pg (ref 26.0–34.0)
MCHC: 32.8 g/dL (ref 30.0–36.0)
MCV: 86.3 fL (ref 80.0–100.0)
Platelets: 410 10*3/uL — ABNORMAL HIGH (ref 150–400)
RBC: 5.02 MIL/uL (ref 4.22–5.81)
RDW: 14.8 % (ref 11.5–15.5)
WBC: 5.9 10*3/uL (ref 4.0–10.5)
nRBC: 0 % (ref 0.0–0.2)

## 2021-12-13 LAB — CBG MONITORING, ED
Glucose-Capillary: 210 mg/dL — ABNORMAL HIGH (ref 70–99)
Glucose-Capillary: 140 mg/dL — ABNORMAL HIGH (ref 70–99)
Glucose-Capillary: 193 mg/dL — ABNORMAL HIGH (ref 70–99)

## 2021-12-13 LAB — BRAIN NATRIURETIC PEPTIDE: B Natriuretic Peptide: 147.4 pg/mL — ABNORMAL HIGH (ref 0.0–100.0)

## 2021-12-13 LAB — RESP PANEL BY RT-PCR (FLU A&B, COVID) ARPGX2
Influenza A by PCR: NEGATIVE
Influenza B by PCR: NEGATIVE
SARS Coronavirus 2 by RT PCR: NEGATIVE

## 2021-12-13 LAB — GLUCOSE, CAPILLARY: Glucose-Capillary: 220 mg/dL — ABNORMAL HIGH (ref 70–99)

## 2021-12-13 LAB — LIPASE, BLOOD: Lipase: 31 U/L (ref 11–51)

## 2021-12-13 MED ORDER — OXYCODONE-ACETAMINOPHEN 5-325 MG PO TABS
1.0000 | ORAL_TABLET | Freq: Four times a day (QID) | ORAL | Status: DC | PRN
  Administered 2021-12-13 – 2021-12-15 (×6): 1 via ORAL
  Filled 2021-12-13 (×6): qty 1

## 2021-12-13 MED ORDER — ACETAMINOPHEN 650 MG RE SUPP: 650.0000 mg | Freq: Four times a day (QID) | RECTAL | Status: DC | PRN

## 2021-12-13 MED ORDER — INSULIN ASPART 100 UNIT/ML IJ SOLN
0.0000 [IU] | Freq: Every day | INTRAMUSCULAR | Status: DC
  Administered 2021-12-14: 2 [IU] via SUBCUTANEOUS

## 2021-12-13 MED ORDER — HEPARIN SODIUM (PORCINE) 5000 UNIT/ML IJ SOLN
5000.0000 [IU] | Freq: Three times a day (TID) | INTRAMUSCULAR | Status: DC
Start: 2021-12-13 — End: 2021-12-15
  Filled 2021-12-13 (×3): qty 1

## 2021-12-13 MED ORDER — ONDANSETRON HCL 4 MG/2ML IJ SOLN
4.0000 mg | Freq: Once | INTRAMUSCULAR | Status: AC
  Administered 2021-12-13: 4 mg via INTRAVENOUS
  Filled 2021-12-13: qty 2

## 2021-12-13 MED ORDER — LACTATED RINGERS IV SOLN: INTRAVENOUS | Status: AC

## 2021-12-13 MED ORDER — ACETAMINOPHEN 325 MG PO TABS: 650.0000 mg | ORAL_TABLET | Freq: Four times a day (QID) | ORAL | Status: DC | PRN

## 2021-12-13 MED ORDER — ATORVASTATIN CALCIUM 80 MG PO TABS
80.0000 mg | ORAL_TABLET | Freq: Every day | ORAL | Status: DC
  Administered 2021-12-13 – 2021-12-15 (×3): 80 mg via ORAL
  Filled 2021-12-13: qty 1
  Filled 2021-12-13: qty 2
  Filled 2021-12-13: qty 1

## 2021-12-13 MED ORDER — HYDRALAZINE HCL 50 MG PO TABS
50.0000 mg | ORAL_TABLET | Freq: Three times a day (TID) | ORAL | Status: DC
  Administered 2021-12-13 – 2021-12-15 (×7): 50 mg via ORAL
  Filled 2021-12-13 (×2): qty 1
  Filled 2021-12-13: qty 2
  Filled 2021-12-13 (×2): qty 1
  Filled 2021-12-13: qty 2
  Filled 2021-12-13: qty 1

## 2021-12-13 MED ORDER — CLOPIDOGREL BISULFATE 75 MG PO TABS
75.0000 mg | ORAL_TABLET | Freq: Every day | ORAL | Status: DC
  Administered 2021-12-13 – 2021-12-15 (×3): 75 mg via ORAL
  Filled 2021-12-13 (×3): qty 1

## 2021-12-13 MED ORDER — ASPIRIN 81 MG PO TBEC
81.0000 mg | DELAYED_RELEASE_TABLET | Freq: Every day | ORAL | Status: DC
  Administered 2021-12-13 – 2021-12-15 (×3): 81 mg via ORAL
  Filled 2021-12-13 (×3): qty 1

## 2021-12-13 MED ORDER — LIDOCAINE 5 % EX PTCH
1.0000 | MEDICATED_PATCH | CUTANEOUS | Status: DC
  Administered 2021-12-13 – 2021-12-15 (×3): 1 via TRANSDERMAL
  Filled 2021-12-13 (×3): qty 1

## 2021-12-13 MED ORDER — INSULIN ASPART 100 UNIT/ML IJ SOLN
0.0000 [IU] | Freq: Three times a day (TID) | INTRAMUSCULAR | Status: DC
  Administered 2021-12-15: 5 [IU] via SUBCUTANEOUS

## 2021-12-13 MED ORDER — LACTATED RINGERS IV SOLN: INTRAVENOUS | Status: DC

## 2021-12-13 MED ORDER — TAFAMIDIS 61 MG PO CAPS: 61.0000 mg | ORAL_CAPSULE | Freq: Every day | ORAL | Status: DC

## 2021-12-13 MED ORDER — CARVEDILOL 6.25 MG PO TABS
6.2500 mg | ORAL_TABLET | Freq: Two times a day (BID) | ORAL | Status: DC
  Administered 2021-12-13 – 2021-12-15 (×4): 6.25 mg via ORAL
  Filled 2021-12-13 (×2): qty 1
  Filled 2021-12-13: qty 2
  Filled 2021-12-13: qty 1

## 2021-12-13 MED ORDER — ISOSORBIDE MONONITRATE ER 30 MG PO TB24
30.0000 mg | ORAL_TABLET | Freq: Every day | ORAL | Status: DC
  Administered 2021-12-13 – 2021-12-15 (×3): 30 mg via ORAL
  Filled 2021-12-13 (×3): qty 1

## 2021-12-13 NOTE — Progress Notes (Signed)
PROGRESS NOTE    Thomas West  UXN:[id number] DOB: February 06, 1961 DOA: 12/12/2021 PCP: [name], [name], MD   Brief Narrative:  Thomas West is a 61 y.o. 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 Recently admitted by cardiology service 7/17-7/20 for decompensated CHF in the setting of medication noncompliance (out of meds for about 2 months).  Diuresed 13 pounds with IV Lasix and metolazone.  Creatinine increased on the day of discharge so Entresto and spironolactone were held and scheduled to be restarted the following day.  He presents to the ED today via EMS with complaints of chest pain, shortness of breath, nausea, vomiting, and diarrhea.  Vital signs stable on arrival to the ED.  Labs significant for WBC 7.4, hemoglobin 15.7, platelet count 514k.  Sodium 135, potassium 5.0, chloride 97, bicarb 26, BUN 41, creatinine 3.3 (baseline 1.2-1.4), glucose 184.  High-sensitivity troponin negative x2. BNP 147.  Lipase and LFTs normal.  COVID and influenza PCR negative.  Chest x-ray showing no active cardiopulmonary disease.  CT abdomen pelvis negative for acute finding. Patient was given Zofran and 500 cc normal saline bolus.    Assessment & Plan:   AKI on CKD stage II-IIIa -Likely prerenal in etiology from diuretics and GI losses.  Baseline creatinine 1.2-1.4, bumped to 1.7 at the time of discharge 3 days ago after he was diuresed with IV Lasix and metolazone.  Creatinine is now up to 3.3 in the setting of patient having vomiting and diarrhea. -CT without evidence of obstructive uropathy. -Gentle IV fluid hydration and monitor volume status closely given CHF -Avoid nephrotoxic agents/contrast.  Hold home diuretics and Entresto. -Monitor renal function closely  Diarrhea and vomiting: -CT without evidence of colitis.   COVID and influenza PCR negative. -Lipase, liver enzymes: Within normal limits. -C. difficile PCR and GI pathogen panel-pending -Enteric precautions.  Zofran as needed.  Continue gentle hydration.  Monitor electrolytes.   Chronic systolic CHF/cardiac amyloidosis -Echo done 11/02/2021 showing EF 30 to 35%, grade 1 diastolic dysfunction.  Discharged 3 days ago after being diuresed 13 pounds.  BNP 147.  At present, he appears dry in the setting of vomiting and diarrhea. -Hold Lasix, spironolactone, and Entresto given AKI. -Continue Coreg, hydralazine, and Imdur  -Monitor volume status closely.   CAD -Troponin x2 negative.  Chest x-ray negative for any acute findings.  Patient appears comfortable on exam -Continue aspirin, Coreg, Imdur, and Lipitor   Hypertension -Stable. -Continue Coreg, hydralazine, and Imdur    Poorly controlled type 2 diabetes with neuropathy -A1c 11.7 in November 2022.  Currently not on any medications, Januvia was stopped during recent hospitalization. -Repeat A1c, cont. Sensitive sliding scale insulin   History of CVA -Continue aspirin and statin    DVT prophylaxis: SCD Code Status: Full code Family Communication:  None present at bedside.  Plan of care discussed with patient in length and he verbalized understanding and agreed with it. Disposition Plan: Home  Consultants:  None  Procedures:  None  Antimicrobials:  None  Status is: Observation    Subjective: Patient seen and examined in ED.  Sitting comfortably on the bed.  Reports severe diarrhea and crampy abdominal pain.  Denies further episodes of vomiting.  Reports some chest tightness however denies shortness of breath or leg edema at this time.  Objective: Vitals:   12/12/21 2328 12/12/21 2355  12/13/21 0205 12/13/21 0600  BP: (!) 214/189 (!) 144/72 123/69 121/71  Pulse: 88  82 81  Resp: 20  20 20   Temp: 98.3 F (36.8 C)  98.2 F (36.8 C) 98.1 F (36.7 C)  TempSrc: Oral  Oral Oral   SpO2: 100%  97% 99%   No intake or output data in the 24 hours ending 12/13/21 0923 There were no vitals filed for this visit.  Examination:  General exam: Appears calm and comfortable, on room air, communicating well Respiratory system: Clear to auscultation. Respiratory effort normal. Cardiovascular system: S1 & S2 heard, RRR. No JVD, murmurs, rubs, gallops or clicks. No pedal edema. Gastrointestinal system: Abdomen is nondistended, soft and nontender. No organomegaly or masses felt. Normal bowel sounds heard. Central nervous system: Alert and oriented. No focal neurological deficits. Extremities: Symmetric 5 x 5 power. Skin: No rashes, lesions or ulcers Psychiatry: Judgement and insight appear normal. Mood & affect appropriate.    Data Reviewed: I have personally reviewed following labs and imaging studies  CBC: Recent Labs  Lab 12/07/21 2032 12/08/21 0055 12/12/21 1643 12/13/21 0640  WBC 7.4 6.6 7.4 5.9  NEUTROABS 4.3  --   --   --   HGB 12.6* 10.9* 15.7 14.2  HCT 39.0 33.4* 48.1 43.3  MCV 88.2 87.2 86.0 86.3  PLT 431* 375 514* [id number]*   Basic Metabolic Panel: Recent Labs  Lab 12/07/21 2032 12/08/21 0055 12/08/21 0517 12/09/21 0338 12/10/21 0252 12/12/21 1643 12/13/21 0640  NA 139  --  140 140 138 135 136  K 4.1  --  4.7 4.1 4.4 5.0 4.3  CL 106  --  101 103 99 97* 103  CO2 24  --  27 28 29 26 24   GLUCOSE 135*  --  165* 145* 157* 184* 235*  BUN 14  --  15 17 23* 41* 42*  CREATININE 1.40*   < > 1.37* 1.35* 1.75* 3.34* 2.65*  CALCIUM 8.5*  --  8.8* 8.4* 8.5* 9.3 8.5*  MG 1.6*  --  2.0 1.9 1.9  --   --    < > = values in this interval not displayed.   GFR: Estimated Creatinine Clearance: 28.5 mL/min (A) (by C-G formula based on SCr of 2.65 mg/dL (H)). Liver Function Tests: Recent Labs  Lab 12/07/21 2032 12/12/21 1946  AST 33 25  ALT 34 20  ALKPHOS 143* 117  BILITOT 0.9 0.4  PROT 6.9 8.3*  ALBUMIN 3.4* 4.1   Recent Labs  Lab 12/07/21 2032  12/12/21 1946  LIPASE 26 31   No results for input(s): "AMMONIA" in the last 168 hours. Coagulation Profile: No results for input(s): "INR", "PROTIME" in the last 168 hours. Cardiac Enzymes: No results for input(s): "CKTOTAL", "CKMB", "CKMBINDEX", "TROPONINI" in the last 168 hours. BNP (last 3 results) Recent Labs    02/03/21 0911  PROBNP 3,012.0*   HbA1C: No results for input(s): "HGBA1C" in the last 72 hours. CBG: Recent Labs  Lab 12/13/21 0810  GLUCAP 210*   Lipid Profile: No results for input(s): "CHOL", "HDL", "LDLCALC", "TRIG", "CHOLHDL", "LDLDIRECT" in the last 72 hours. Thyroid Function Tests: No results for input(s): "TSH", "T4TOTAL", "FREET4", "T3FREE", "THYROIDAB" in the last 72 hours. Anemia Panel: No results for input(s): "VITAMINB12", "FOLATE", "FERRITIN", "TIBC", "IRON", "RETICCTPCT" in the last 72 hours. Sepsis Labs: No results for input(s): "PROCALCITON", "LATICACIDVEN" in the last 168 hours.  Recent Results (from the past 240 hour(s))  Resp Panel by RT-PCR (Flu A&B, Covid) Anterior Nasal  Swab     Status: None   Collection Time: 12/13/21 12:11 AM   Specimen: Anterior Nasal Swab  Result Value Ref Range Status   SARS Coronavirus 2 by RT PCR NEGATIVE NEGATIVE Final    Comment: (NOTE) SARS-CoV-2 target nucleic acids are NOT DETECTED.  The SARS-CoV-2 RNA is generally detectable in upper respiratory specimens during the acute phase of infection. The lowest concentration of SARS-CoV-2 viral copies this assay can detect is 138 copies/mL. A negative result does not preclude SARS-Cov-2 infection and should not be used as the sole basis for treatment or other patient management decisions. A negative result may occur with  improper specimen collection/handling, submission of specimen other than nasopharyngeal swab, presence of viral mutation(s) within the areas targeted by this assay, and inadequate number of viral copies(<138 copies/mL). A negative result must  be combined with clinical observations, patient history, and epidemiological information. The expected result is Negative.  Fact Sheet for Patients:  BloggerCourse.com  Fact Sheet for Healthcare Providers:  SeriousBroker.it  This test is no t yet approved or cleared by the [place] FDA and  has been authorized for detection and/or diagnosis of SARS-CoV-2 by FDA under an Emergency Use Authorization (EUA). This EUA will remain  in effect (meaning this test can be used) for the duration of the COVID-19 declaration under Section 564(b)(1) of the Act, 21 U.S.C.section 360bbb-3(b)(1), unless the authorization is terminated  or revoked sooner.       Influenza A by PCR NEGATIVE NEGATIVE Final   Influenza B by PCR NEGATIVE NEGATIVE Final    Comment: (NOTE) The Xpert Xpress SARS-CoV-2/FLU/RSV plus assay is intended as an aid in the diagnosis of influenza from Nasopharyngeal swab specimens and should not be used as a sole basis for treatment. Nasal washings and aspirates are unacceptable for Xpert Xpress SARS-CoV-2/FLU/RSV testing.  Fact Sheet for Patients: BloggerCourse.com  Fact Sheet for Healthcare Providers: SeriousBroker.it  This test is not yet approved or cleared by the [place] FDA and has been authorized for detection and/or diagnosis of SARS-CoV-2 by FDA under an Emergency Use Authorization (EUA). This EUA will remain in effect (meaning this test can be used) for the duration of the COVID-19 declaration under Section 564(b)(1) of the Act, 21 U.S.C. section 360bbb-3(b)(1), unless the authorization is terminated or revoked.  Performed at [facility], [address] [place], [place] [address]       Radiology Studies: CT ABDOMEN PELVIS WO CONTRAST  Result Date: 12/13/2021 CLINICAL DATA:  Abdominal pain EXAM: CT ABDOMEN AND PELVIS WITHOUT CONTRAST  TECHNIQUE: Multidetector CT imaging of the abdomen and pelvis was performed following the standard protocol without IV contrast. RADIATION DOSE REDUCTION: This exam was performed according to the departmental dose-optimization program which includes automated exposure control, adjustment of the mA and/or kV according to patient size and/or use of iterative reconstruction technique. COMPARISON:  None Available. FINDINGS: Lower chest: Lung bases are clear. Hepatobiliary: Unenhanced liver is unremarkable. Gallbladder is unremarkable. No intrahepatic or extrahepatic duct dilatation. Pancreas: Within normal limits. Spleen: Within normal limits. Adrenals/Urinary Tract: 14 mm right adrenal nodule (series 3/image 11), likely reflecting a benign adrenal adenoma. No follow-up is recommended. Left adrenal gland is within normal limits. Kidneys are within normal limits. No renal calculi or hydronephrosis. Mildly thick-walled bladder, although underdistended. Stomach/Bowel: Stomach is within normal limits. No evidence of bowel obstruction. Normal appendix (series 3/image 47). No colonic wall thickening or inflammatory changes. Vascular/Lymphatic: No evidence of abdominal aortic aneurysm. Atherosclerotic calcifications of  the abdominal aorta and branch vessels. No suspicious abdominopelvic lymphadenopathy. Reproductive: Prostate is notable for enlargement of the central gland, suggesting BPH. Other: No abdominopelvic ascites. Tiny fat containing left inguinal hernia (series 3/image 64). Musculoskeletal: Visualized osseous structures are within normal limits. IMPRESSION: No CT findings to account for the patient's abdominal pain. Electronically Signed   By: Julian Hy M.D.   On: 12/13/2021 01:10   DG Chest 2 View  Result Date: 12/12/2021 CLINICAL DATA:  Shortness of breath EXAM: CHEST - 2 VIEW COMPARISON:  12/07/2021 FINDINGS: Normal heart size and pulmonary vascularity. No focal airspace disease or consolidation in the  lungs. No blunting of costophrenic angles. No pneumothorax. Mediastinal contours appear intact. Calcification of the aorta. IMPRESSION: No active cardiopulmonary disease. Electronically Signed   By: Lucienne Capers M.D.   On: 12/12/2021 17:43    Scheduled Meds:  heparin  5,000 Units Subcutaneous Q8H   insulin aspart  0-5 Units Subcutaneous QHS   insulin aspart  0-9 Units Subcutaneous TID WC   lidocaine  1 patch Transdermal Q24H   Continuous Infusions:  lactated ringers 75 mL/hr at 12/13/21 0618     LOS: 0 days   Time spent: 35 minutes   Massiah Minjares Loann Quill, MD [facility]  If 7PM-7AM, please contact night-coverage [email or web address] 12/13/2021, 9:23 AM

## 2021-12-13 NOTE — H&P (Signed)
History and Physical    Thomas West [id number] DOB: [birth year] DOA: 12/12/2021  PCP: [name], [name], MD  Patient coming from: Home  Chief Complaint: Multiple complaints  HPI: Thomas West is a 61 y.o. 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 Recently admitted by cardiology service 7/17-7/20 for decompensated CHF in the setting of medication noncompliance (out of meds for about 2 months).  Diuresed 13 pounds with IV Lasix and metolazone.  Creatinine increased on the day of discharge so Entresto and spironolactone were held and scheduled to be restarted the following day.  He presents to the ED today via EMS with complaints of chest pain, shortness of breath, nausea, vomiting, and diarrhea.  Vital signs stable on arrival to the ED.  Labs significant for WBC 7.4, hemoglobin 15.7, platelet count 514k.  Sodium 135, potassium 5.0, chloride 97, bicarb 26, BUN 41, creatinine 3.3 (baseline 1.2-1.4), glucose 184.  High-sensitivity troponin negative x2. BNP 147.  Lipase and LFTs normal.  COVID and influenza PCR negative.  Chest x-ray showing no active cardiopulmonary disease.  CT abdomen pelvis negative for acute finding. Patient was given Zofran and 500 cc normal saline bolus.  Patient states after he left the hospital, the following day he took his medications as instructed and then started having vomiting and diarrhea.  Reports multiple episodes of nonbloody profuse watery diarrhea.  He also continued to vomit and was not able to tolerate p.o. intake at home.  Also reporting abdominal discomfort/"bubbles in my stomach."  In addition, complaining of ongoing shortness of breath and chest pain since after he left the hospital.  Review of Systems:  Review of Systems  All other systems reviewed and are  negative.   Past Medical History:  Diagnosis Date   CHF (congestive heart failure) (HCC)    Coronary artery disease    Diabetes mellitus without complication ([place])    History of MI (myocardial infarction) 03/05/2019   Hypertension    Stroke [facility])     Past Surgical History:  Procedure Laterality Date   leg surgery     "pins in left shin"   RIGHT/LEFT HEART CATH AND CORONARY ANGIOGRAPHY N/A 03/11/2021   Procedure: RIGHT/LEFT HEART CATH AND CORONARY ANGIOGRAPHY;  Surgeon: Early Osmond, MD;  Location: [facility];  Service: Cardiovascular;  Laterality: N/A;     reports that he quit smoking about 4 years ago. His smoking use included cigarettes. He has never used smokeless tobacco. He reports that he does not currently use alcohol. He reports current drug use. Drug: Marijuana.  Allergies  Allergen Reactions   Bee Venom     swelling    History reviewed. No pertinent family history.  Prior to Admission medications   Medication Sig Start Date End Date Taking? Authorizing Provider  aspirin EC 81 MG tablet Take 1 tablet (81 mg total) by mouth daily. Swallow whole. 12/11/21   Earnie Larsson, NP  atorvastatin (LIPITOR) 80 MG tablet Take 1 tablet (80 mg total) by mouth daily. 12/10/21 07/08/22  Earnie Larsson, NP  carvedilol (COREG) 6.25 MG tablet Take 1 tablet (6.25 mg total) by mouth EVERY 12 HOURS (10AM &10PM) 12/10/21 07/08/22  Earnie Larsson, NP  furosemide (LASIX) 40 MG tablet Take 1 tablet (40 mg total) by mouth daily. 12/11/21 07/09/22  Earnie Larsson, NP  hydrALAZINE (  APRESOLINE) 50 MG tablet Take 1 tablet (50 mg total) by mouth 3 (three) times daily. 12/10/21 07/08/22  Alen Bleacher, NP  hydrocortisone cream 1 % Apply to affected area 2 times daily 12/10/21   Alen Bleacher, NP  isosorbide mononitrate (IMDUR) 30 MG 24 hr tablet Take 1 tablet (30 mg total) by mouth daily. 12/10/21   Alen Bleacher, NP  sacubitril-valsartan (ENTRESTO) 97-103 MG Take 1 tablet by mouth 2 (two) times daily.  12/11/21   Alen Bleacher, NP  spironolactone (ALDACTONE) 25 MG tablet Take 1 tablet (25 mg total) by mouth daily. 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 09/02/21   Bensimhon, Bevelyn Buckles, MD    Physical Exam: Vitals:   12/12/21 1937 12/12/21 2328 12/12/21 2355 12/13/21 0205  BP: 119/80 (!) 214/189 (!) 144/72 123/69  Pulse: 87 88  82  Resp: 20 20  20   Temp: 98.1 F (36.7 C) 98.3 F (36.8 C)  98.2 F (36.8 C)  TempSrc: Oral Oral  Oral  SpO2: 98% 100%  97%    Physical Exam Vitals reviewed.  Constitutional:      General: He is not in acute distress. HENT:     Head: Normocephalic and atraumatic.  Eyes:     Extraocular Movements: Extraocular movements intact.  Cardiovascular:     Rate and Rhythm: Normal rate and regular rhythm.     Pulses: Normal pulses.  Pulmonary:     Effort: Pulmonary effort is normal. No respiratory distress.     Breath sounds: Normal breath sounds. No wheezing or rales.  Abdominal:     General: Bowel sounds are normal. There is no distension.     Palpations: Abdomen is soft.     Tenderness: There is no abdominal tenderness. There is no guarding or rebound.  Musculoskeletal:        General: No swelling or tenderness.     Cervical back: Normal range of motion.  Skin:    General: Skin is warm and dry.  Neurological:     General: No focal deficit present.     Mental Status: He is alert and oriented to person, place, and time.      Labs on Admission: I have personally reviewed following labs and imaging studies  CBC: Recent Labs  Lab 12/07/21 2032 12/08/21 0055 12/12/21 1643  WBC 7.4 6.6 7.4  NEUTROABS 4.3  --   --   HGB 12.6* 10.9* 15.7  HCT 39.0 33.4* 48.1  MCV 88.2 87.2 86.0  PLT 431* 375 514*   Basic Metabolic Panel: Recent Labs  Lab 12/07/21 2032 12/08/21 0055 12/08/21 0517 12/09/21 0338 12/10/21 0252 12/12/21 1643  NA 139  --  140 140 138 135  K 4.1  --  4.7 4.1 4.4 5.0  CL 106  --   101 103 99 97*  CO2 24  --  27 28 29 26   GLUCOSE 135*  --  165* 145* 157* 184*  BUN 14  --  15 17 23* 41*  CREATININE 1.40* 1.21 1.37* 1.35* 1.75* 3.34*  CALCIUM 8.5*  --  8.8* 8.4* 8.5* 9.3  MG 1.6*  --  2.0 1.9 1.9  --    GFR: Estimated Creatinine Clearance: 22.6 mL/min (A) (by C-G formula based on SCr of 3.34 mg/dL (H)). Liver Function Tests: Recent Labs  Lab 12/07/21 2032 12/12/21 1946  AST 33 25  ALT 34 20  ALKPHOS 143* 117  BILITOT 0.9 0.4  PROT 6.9 8.3*  ALBUMIN 3.4* 4.1   Recent Labs  Lab 12/07/21 2032 12/12/21 1946  LIPASE 26 31   No results for input(s): "AMMONIA" in the last 168 hours. Coagulation Profile: No results for input(s): "INR", "PROTIME" in the last 168 hours. Cardiac Enzymes: No results for input(s): "CKTOTAL", "CKMB", "CKMBINDEX", "TROPONINI" in the last 168 hours. BNP (last 3 results) Recent Labs    02/03/21 0911  PROBNP 3,012.0*   HbA1C: No results for input(s): "HGBA1C" in the last 72 hours. CBG: No results for input(s): "GLUCAP" in the last 168 hours. Lipid Profile: No results for input(s): "CHOL", "HDL", "LDLCALC", "TRIG", "CHOLHDL", "LDLDIRECT" in the last 72 hours. Thyroid Function Tests: No results for input(s): "TSH", "T4TOTAL", "FREET4", "T3FREE", "THYROIDAB" in the last 72 hours. Anemia Panel: No results for input(s): "VITAMINB12", "FOLATE", "FERRITIN", "TIBC", "IRON", "RETICCTPCT" in the last 72 hours. Urine analysis:    Component Value Date/Time   COLORURINE YELLOW 11/10/2021 1937   APPEARANCEUR CLEAR 11/10/2021 1937   LABSPEC 1.012 11/10/2021 1937   PHURINE 5.0 11/10/2021 1937   GLUCOSEU NEGATIVE 11/10/2021 1937   HGBUR NEGATIVE 11/10/2021 1937   BILIRUBINUR NEGATIVE 11/10/2021 1937   BILIRUBINUR negative 01/02/2021 1312   KETONESUR NEGATIVE 11/10/2021 1937   PROTEINUR 100 (A) 11/10/2021 1937   UROBILINOGEN 0.2 01/02/2021 1312   NITRITE NEGATIVE 11/10/2021 1937   LEUKOCYTESUR NEGATIVE 11/10/2021 1937     Radiological Exams on Admission: I have personally reviewed images CT ABDOMEN PELVIS WO CONTRAST  Result Date: 12/13/2021 CLINICAL DATA:  Abdominal pain EXAM: CT ABDOMEN AND PELVIS WITHOUT CONTRAST TECHNIQUE: Multidetector CT imaging of the abdomen and pelvis was performed following the standard protocol without IV contrast. RADIATION DOSE REDUCTION: This exam was performed according to the departmental dose-optimization program which includes automated exposure control, adjustment of the West and/or kV according to patient size and/or use of iterative reconstruction technique. COMPARISON:  None Available. FINDINGS: Lower chest: Lung bases are clear. Hepatobiliary: Unenhanced liver is unremarkable. Gallbladder is unremarkable. No intrahepatic or extrahepatic duct dilatation. Pancreas: Within normal limits. Spleen: Within normal limits. Adrenals/Urinary Tract: 14 mm right adrenal nodule (series 3/image 11), likely reflecting a benign adrenal adenoma. No follow-up is recommended. Left adrenal gland is within normal limits. Kidneys are within normal limits. No renal calculi or hydronephrosis. Mildly thick-walled bladder, although underdistended. Stomach/Bowel: Stomach is within normal limits. No evidence of bowel obstruction. Normal appendix (series 3/image 47). No colonic wall thickening or inflammatory changes. Vascular/Lymphatic: No evidence of abdominal aortic aneurysm. Atherosclerotic calcifications of the abdominal aorta and branch vessels. No suspicious abdominopelvic lymphadenopathy. Reproductive: Prostate is notable for enlargement of the central gland, suggesting BPH. Other: No abdominopelvic ascites. Tiny fat containing left inguinal hernia (series 3/image 64). Musculoskeletal: Visualized osseous structures are within normal limits. IMPRESSION: No CT findings to account for the patient's abdominal pain. Electronically Signed   By: Charline Bills M.D.   On: 12/13/2021 01:10   DG Chest 2  View  Result Date: 12/12/2021 CLINICAL DATA:  Shortness of breath EXAM: CHEST - 2 VIEW COMPARISON:  12/07/2021 FINDINGS: Normal heart size and pulmonary vascularity. No focal airspace disease or consolidation in the lungs. No blunting of costophrenic angles. No pneumothorax. Mediastinal contours appear intact. Calcification of the aorta. IMPRESSION: No active cardiopulmonary disease. Electronically Signed   By: Burman Nieves M.D.   On: 12/12/2021 17:43    EKG: Pending at this time.  Assessment and Plan  AKI on CKD stage II-IIIa Likely prerenal in etiology  from diuretics and GI losses.  Baseline creatinine 1.2-1.4, bumped to 1.7 at the time of discharge 3 days ago after he was diuresed with IV Lasix and metolazone.  Creatinine is now up to 3.3 in the setting of patient having vomiting and diarrhea.  CT without evidence of obstructive uropathy. -Gentle IV fluid hydration and monitor volume status closely given CHF -Avoid nephrotoxic agents/contrast.  Hold home diuretics and Entresto. -Monitor renal function closely, consult nephrology if not improving.  Profuse watery diarrhea CT without evidence of colitis.  COVID and influenza PCR negative. -C. difficile PCR and GI pathogen panel -Enteric precautions  Nausea and vomiting Lipase and LFTs normal.  CT abdomen pelvis without acute finding.  COVID and influenza PCR negative.  He was able to tolerate p.o. challenge in the ED. -Continue to monitor/symptomatic management  Shortness of breath Patient is complaining of shortness of breath, however, satting in the upper 90s on room air and appears comfortable with no signs of respiratory distress.  Chest x-ray showing no active cardiopulmonary disease.  PE less likely given no tachycardia. -Continue to monitor  Chest pain Appears atypical, ongoing for the past 2 or 3 days.  ACS less likely as high-sensitivity troponin negative x2.  PE less likely given no tachycardia or hypoxia.  Patient appears  comfortable. -Cardiac monitoring  Chronic systolic CHF/cardiac amyloidosis Echo done 11/02/2021 showing EF 30 to [id number], grade 1 diastolic dysfunction.  Discharged 3 days ago after being diuresed 13 pounds.  BNP 147.  At present, he appears dry in the setting of vomiting and diarrhea. -Hold Lasix, spironolactone, and Entresto given AKI. -Continue Coreg, hydralazine, and Imdur after pharmacy med rec is done. -Monitor volume status closely.  CAD Work-up not suggestive of ACS. -Continue aspirin, Coreg, Imdur, and Lipitor after pharmacy med rec is done.  Hypertension Stable. -Continue Coreg, hydralazine, and Imdur after pharmacy med rec is done.  Poorly controlled type 2 diabetes with neuropathy A1c 11.7 in November 2022.  Currently not on any medications, Januvia was stopped during recent hospitalization. -Repeat A1c and consider starting long-acting insulin -Sensitive sliding scale insulin  History of CVA -Continue aspirin and statin after pharmacy med rec is done.  DVT prophylaxis: SQ Heparin Code Status: Full Code (discussed with the patient) Family Communication: No family available at this time. Level of care: Telemetry bed Admission status: It is my clinical opinion that referral for OBSERVATION is reasonable and necessary in this patient based on the above information provided. The aforementioned taken together are felt to place the patient at high risk for further clinical deterioration. However, it is anticipated that the patient may be medically stable for discharge from the hospital within 24 to 48 hours.   Shela Leff MD [facility]  If 7PM-7AM, please contact night-coverage [email or web address]  12/13/2021, 5:31 AM

## 2021-12-13 NOTE — ED Notes (Signed)
Patient wanted to try something to drink, md stated patient could try. PO fluids given. Will monitor how patient tolerates PO fluids.

## 2021-12-13 NOTE — ED Notes (Signed)
Patient requesting lidocaine patch for back, Messaged admitting provider, order received, medication given. Patient resting in bed. NAD Noted.

## 2021-12-13 NOTE — ED Notes (Signed)
Patient able to tolerate PO fluids, snack given. Patient denies any needs at this time.

## 2021-12-14 ENCOUNTER — Encounter (HOSPITAL_COMMUNITY): Payer: Medicare Other

## 2021-12-14 ENCOUNTER — Other Ambulatory Visit (HOSPITAL_COMMUNITY): Payer: Self-pay

## 2021-12-14 DIAGNOSIS — R0602 Shortness of breath: Secondary | ICD-10-CM

## 2021-12-14 DIAGNOSIS — E1159 Type 2 diabetes mellitus with other circulatory complications: Secondary | ICD-10-CM

## 2021-12-14 DIAGNOSIS — I5020 Unspecified systolic (congestive) heart failure: Secondary | ICD-10-CM

## 2021-12-14 DIAGNOSIS — N1831 Chronic kidney disease, stage 3a: Secondary | ICD-10-CM | POA: Diagnosis not present

## 2021-12-14 DIAGNOSIS — I152 Hypertension secondary to endocrine disorders: Secondary | ICD-10-CM

## 2021-12-14 DIAGNOSIS — I2510 Atherosclerotic heart disease of native coronary artery without angina pectoris: Secondary | ICD-10-CM

## 2021-12-14 DIAGNOSIS — E1140 Type 2 diabetes mellitus with diabetic neuropathy, unspecified: Secondary | ICD-10-CM

## 2021-12-14 DIAGNOSIS — N179 Acute kidney failure, unspecified: Secondary | ICD-10-CM | POA: Diagnosis not present

## 2021-12-14 DIAGNOSIS — I5022 Chronic systolic (congestive) heart failure: Secondary | ICD-10-CM

## 2021-12-14 LAB — CBC
HCT: 38.6 % — ABNORMAL LOW (ref 39.0–52.0)
Hemoglobin: 13 g/dL (ref 13.0–17.0)
MCH: 28.4 pg (ref 26.0–34.0)
MCHC: 33.7 g/dL (ref 30.0–36.0)
MCV: 84.3 fL (ref 80.0–100.0)
Platelets: 379 10*3/uL (ref 150–400)
RBC: 4.58 MIL/uL (ref 4.22–5.81)
RDW: 14.6 % (ref 11.5–15.5)
WBC: 5 10*3/uL (ref 4.0–10.5)
nRBC: 0 % (ref 0.0–0.2)

## 2021-12-14 LAB — MAGNESIUM: Magnesium: 1.9 mg/dL (ref 1.7–2.4)

## 2021-12-14 LAB — GLUCOSE, CAPILLARY
Glucose-Capillary: 143 mg/dL — ABNORMAL HIGH (ref 70–99)
Glucose-Capillary: 158 mg/dL — ABNORMAL HIGH (ref 70–99)
Glucose-Capillary: 230 mg/dL — ABNORMAL HIGH (ref 70–99)

## 2021-12-14 LAB — BASIC METABOLIC PANEL
Anion gap: 7 (ref 5–15)
BUN: 34 mg/dL — ABNORMAL HIGH (ref 6–20)
CO2: 23 mmol/L (ref 22–32)
Calcium: 8.4 mg/dL — ABNORMAL LOW (ref 8.9–10.3)
Chloride: 102 mmol/L (ref 98–111)
Creatinine, Ser: 1.92 mg/dL — ABNORMAL HIGH (ref 0.61–1.24)
GFR, Estimated: 40 mL/min — ABNORMAL LOW (ref >60)
Glucose, Bld: 134 mg/dL — ABNORMAL HIGH (ref 70–99)
Potassium: 4.6 mmol/L (ref 3.5–5.1)
Sodium: 132 mmol/L — ABNORMAL LOW (ref 135–145)

## 2021-12-14 NOTE — Progress Notes (Signed)
Pt ate 100% of ordered lunch.

## 2021-12-14 NOTE — Progress Notes (Signed)
Progress Note  Patient: Thomas West [id number] DOB: [birth year]  DOA: 12/12/2021  DOS: 12/14/2021    Brief hospital course: Jasai Sandelin is a 61 y.o. 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 Recently admitted by cardiology service 7/17-7/20 for decompensated CHF in the setting of medication noncompliance (out of meds for about 2 months).  Diuresed 13 pounds with IV Lasix and metolazone.  Creatinine increased on the day of discharge so Entresto and spironolactone were held and scheduled to be restarted the following day.  He presents to the ED today via EMS with complaints of chest pain, shortness of breath, nausea, vomiting, and diarrhea.  Vital signs stable on arrival to the ED.  Labs significant for WBC 7.4, hemoglobin 15.7, platelet count 514k.  Sodium 135, potassium 5.0, chloride 97, bicarb 26, BUN 41, creatinine 3.3 (baseline 1.2-1.4), glucose 184.  High-sensitivity troponin negative x2. BNP 147.  Lipase and LFTs normal.  COVID and influenza PCR negative.  Chest x-ray showing no active cardiopulmonary disease.  CT abdomen pelvis negative for acute finding. Patient was given Zofran and 500 cc normal saline bolus.  Assessment and Plan: AKI on stage IIIa CKD: Prerenal most likely due to volume depletion compounded by GDMT for CHF.  - Improving while holding diuretic/ARNI which we will hold again today.  - Push po intake. If not eating well, would give some IVF as he continues to appear dry on exam.  - Recheck in AM   Diarrhea and vomiting: Unclear etiology, though symptoms have resolved spontaneously.  - Zofran as needed.     Chronic systolic CHF/cardiac amyloidosis -Echo done 11/02/2021 showing EF 30 to [id number], grade 1 diastolic dysfunction.  Discharged 3 days ago after being diuresed 13 pounds.  BNP 147.   At present, he appears dry in the setting of vomiting and diarrhea. -Hold Lasix, spironolactone, and Entresto given AKI. -Continue Coreg, hydralazine, and Imdur. BP is ok. -Monitor volume status closely.   CAD -Continue aspirin, Coreg, Imdur, and Lipitor   Hypertension -Stable. -Continue Coreg, hydralazine, and Imdur    Poorly controlled type 2 diabetes with neuropathy: HbA1c now 7.3%. - Would ideally by on SGLT2i, currently limited by AKI - Continue SSI   History of CVA -Continue aspirin and statin    Subjective: Nausea improved, vomiting and diarrhea subsided. No dyspnea. He's reluctant to begin eating but needs to get back home ASAP.   Objective: Vitals:   12/13/21 2303 12/14/21 0544 12/14/21 1008 12/14/21 1545  BP:  (!) 137/94 (!) 141/80 (!) 144/89  Pulse:  73 75   Resp:  16 (!) 9 16  Temp:  98.3 F (36.8 C) (!) 97.5 F (36.4 C) 98.4 F (36.9 C)  TempSrc:  Oral Oral   SpO2:  97% 100% 100%  Weight:  66.9 kg    Height: 5\' 9"  (1.753 m)      Gen: 61 y.o. male in no distress Neck: No JVD or HJR Pulm: Nonlabored breathing room air. Clear CV: Regular rate and rhythm. No murmur, rub, or gallop. No JVD, no significant dependent edema. GI: Abdomen soft, non-tender, non-distended, with normoactive bowel sounds.  Ext: Warm, no deformities Skin: No rashes, lesions or ulcers on visualized skin. Neuro: Alert and oriented. No focal neurological deficits. Psych: Judgement and insight appear fair. Mood euthymic & affect congruent. Behavior is appropriate.  Data Personally reviewed:  CBC: Recent Labs  Lab 12/07/21 2032 12/08/21 0055 12/12/21 1643 12/13/21 0640 12/14/21 0504  WBC 7.4 6.6 7.4 5.9 5.0  NEUTROABS 4.3  --   --   --   --   HGB 12.6* 10.9* 15.7 14.2 13.0  HCT 39.0 33.4* 48.1 43.3 38.6*  MCV 88.2 87.2 86.0 86.3 84.3  PLT 431* 375 514* 410* [id number]   Basic Metabolic Panel: Recent Labs  Lab 12/07/21 2032 12/08/21 0055 12/08/21 0517 12/09/21 0338  12/10/21 0252 12/12/21 1643 12/13/21 0640 12/14/21 0504  NA 139  --  140 140 138 135 136 132*  K 4.1  --  4.7 4.1 4.4 5.0 4.3 4.6  CL 106  --  101 103 99 97* 103 102  CO2 24  --  27 28 29 26 24 23   GLUCOSE 135*  --  165* 145* 157* 184* 235* 134*  BUN 14  --  15 17 23* 41* 42* 34*  CREATININE 1.40*   < > 1.37* 1.35* 1.75* 3.34* 2.65* 1.92*  CALCIUM 8.5*  --  8.8* 8.4* 8.5* 9.3 8.5* 8.4*  MG 1.6*  --  2.0 1.9 1.9  --   --  1.9   < > = values in this interval not displayed.   GFR: Estimated Creatinine Clearance: 39.7 mL/min (A) (by C-G formula based on SCr of 1.92 mg/dL (H)). Liver Function Tests: Recent Labs  Lab 12/07/21 2032 12/12/21 1946  AST 33 25  ALT 34 20  ALKPHOS 143* 117  BILITOT 0.9 0.4  PROT 6.9 8.3*  ALBUMIN 3.4* 4.1   Recent Labs  Lab 12/07/21 2032 12/12/21 1946  LIPASE 26 31   No results for input(s): "AMMONIA" in the last 168 hours. Coagulation Profile: No results for input(s): "INR", "PROTIME" in the last 168 hours. Cardiac Enzymes: No results for input(s): "CKTOTAL", "CKMB", "CKMBINDEX", "TROPONINI" in the last 168 hours. BNP (last 3 results) Recent Labs    02/03/21 0911  PROBNP 3,012.0*   HbA1C: Recent Labs    12/13/21 0640  HGBA1C 7.3*   CBG: Recent Labs  Lab 12/13/21 1206 12/13/21 1702 12/13/21 2154 12/14/21 0548 12/14/21 1607  GLUCAP 140* 193* 220* 143* 158*   Lipid Profile: No results for input(s): "CHOL", "HDL", "LDLCALC", "TRIG", "CHOLHDL", "LDLDIRECT" in the last 72 hours. Thyroid Function Tests: No results for input(s): "TSH", "T4TOTAL", "FREET4", "T3FREE", "THYROIDAB" in the last 72 hours. Anemia Panel: No results for input(s): "VITAMINB12", "FOLATE", "FERRITIN", "TIBC", "IRON", "RETICCTPCT" in the last 72 hours. Urine analysis:    Component Value Date/Time   COLORURINE YELLOW 11/10/2021 1937   APPEARANCEUR CLEAR 11/10/2021 1937   LABSPEC 1.012 11/10/2021 1937   PHURINE 5.0 11/10/2021 1937   GLUCOSEU NEGATIVE  11/10/2021 1937   HGBUR NEGATIVE 11/10/2021 1937   BILIRUBINUR NEGATIVE 11/10/2021 1937   BILIRUBINUR negative 01/02/2021 1312   KETONESUR NEGATIVE 11/10/2021 1937   PROTEINUR 100 (A) 11/10/2021 1937   UROBILINOGEN 0.2 01/02/2021 1312   NITRITE NEGATIVE 11/10/2021 1937   LEUKOCYTESUR NEGATIVE 11/10/2021 1937   Recent Results (from the past 240 hour(s))  Resp Panel by RT-PCR (Flu A&B, Covid) Anterior Nasal Swab     Status: None   Collection Time: 12/13/21 12:11 AM   Specimen: Anterior Nasal Swab  Result Value Ref Range Status   SARS Coronavirus 2 by RT PCR NEGATIVE NEGATIVE Final    Comment: (NOTE) SARS-CoV-2 target nucleic acids are NOT DETECTED.  The SARS-CoV-2 RNA is generally detectable in upper respiratory specimens during the  acute phase of infection. The lowest concentration of SARS-CoV-2 viral copies this assay can detect is 138 copies/mL. A negative result does not preclude SARS-Cov-2 infection and should not be used as the sole basis for treatment or other patient management decisions. A negative result may occur with  improper specimen collection/handling, submission of specimen other than nasopharyngeal swab, presence of viral mutation(s) within the areas targeted by this assay, and inadequate number of viral copies(<138 copies/mL). A negative result must be combined with clinical observations, patient history, and epidemiological information. The expected result is Negative.  Fact Sheet for Patients:  BloggerCourse.com  Fact Sheet for Healthcare Providers:  SeriousBroker.it  This test is no t yet approved or cleared by the [place] FDA and  has been authorized for detection and/or diagnosis of SARS-CoV-2 by FDA under an Emergency Use Authorization (EUA). This EUA will remain  in effect (meaning this test can be used) for the duration of the COVID-19 declaration under Section 564(b)(1) of the Act,  21 U.S.C.section 360bbb-3(b)(1), unless the authorization is terminated  or revoked sooner.       Influenza A by PCR NEGATIVE NEGATIVE Final   Influenza B by PCR NEGATIVE NEGATIVE Final    Comment: (NOTE) The Xpert Xpress SARS-CoV-2/FLU/RSV plus assay is intended as an aid in the diagnosis of influenza from Nasopharyngeal swab specimens and should not be used as a sole basis for treatment. Nasal washings and aspirates are unacceptable for Xpert Xpress SARS-CoV-2/FLU/RSV testing.  Fact Sheet for Patients: BloggerCourse.com  Fact Sheet for Healthcare Providers: SeriousBroker.it  This test is not yet approved or cleared by the [place] FDA and has been authorized for detection and/or diagnosis of SARS-CoV-2 by FDA under an Emergency Use Authorization (EUA). This EUA will remain in effect (meaning this test can be used) for the duration of the COVID-19 declaration under Section 564(b)(1) of the Act, 21 U.S.C. section 360bbb-3(b)(1), unless the authorization is terminated or revoked.  Performed at [facility], [address] [place], [place] [address]      CT ABDOMEN PELVIS WO CONTRAST  Result Date: 12/13/2021 CLINICAL DATA:  Abdominal pain EXAM: CT ABDOMEN AND PELVIS WITHOUT CONTRAST TECHNIQUE: Multidetector CT imaging of the abdomen and pelvis was performed following the standard protocol without IV contrast. RADIATION DOSE REDUCTION: This exam was performed according to the departmental dose-optimization program which includes automated exposure control, adjustment of the mA and/or kV according to patient size and/or use of iterative reconstruction technique. COMPARISON:  None Available. FINDINGS: Lower chest: Lung bases are clear. Hepatobiliary: Unenhanced liver is unremarkable. Gallbladder is unremarkable. No intrahepatic or extrahepatic duct dilatation. Pancreas: Within normal limits. Spleen: Within normal limits.  Adrenals/Urinary Tract: 14 mm right adrenal nodule (series 3/image 11), likely reflecting a benign adrenal adenoma. No follow-up is recommended. Left adrenal gland is within normal limits. Kidneys are within normal limits. No renal calculi or hydronephrosis. Mildly thick-walled bladder, although underdistended. Stomach/Bowel: Stomach is within normal limits. No evidence of bowel obstruction. Normal appendix (series 3/image 47). No colonic wall thickening or inflammatory changes. Vascular/Lymphatic: No evidence of abdominal aortic aneurysm. Atherosclerotic calcifications of the abdominal aorta and branch vessels. No suspicious abdominopelvic lymphadenopathy. Reproductive: Prostate is notable for enlargement of the central gland, suggesting BPH. Other: No abdominopelvic ascites. Tiny fat containing left inguinal hernia (series 3/image 64). Musculoskeletal: Visualized osseous structures are within normal limits. IMPRESSION: No CT findings to account for the patient's abdominal pain. Electronically Signed   By: Roselie Awkward.D.  On: 12/13/2021 01:10   DG Chest 2 View  Result Date: 12/12/2021 CLINICAL DATA:  Shortness of breath EXAM: CHEST - 2 VIEW COMPARISON:  12/07/2021 FINDINGS: Normal heart size and pulmonary vascularity. No focal airspace disease or consolidation in the lungs. No blunting of costophrenic angles. No pneumothorax. Mediastinal contours appear intact. Calcification of the aorta. IMPRESSION: No active cardiopulmonary disease. Electronically Signed   By: Burman Nieves M.D.   On: 12/12/2021 17:43    Family Communication: None at bedside  Disposition: Status is: Inpatient due to persistent AKI, poor oral intake Planned Discharge Destination: Home      Tyrone Nine, MD 12/14/2021 4:56 PM Page by [email or web address]

## 2021-12-14 NOTE — Consult Note (Addendum)
Advanced Heart Failure Team Consult Note   Primary Physician: [name], [name], MD PCP-Cardiologist:  Jenkins Rouge, MD  Reason for Consultation: Chronic systolic CHF  HPI:    Thomas West is seen today for evaluation of chronic systolic CHF at the request of Dr. Doristine Bosworth.   Thomas West is a 61 y.o. male with history of CAD with prior MI and stent in 01/2019 at [facility] in [place] (report not available), cardiomyopathy/chronic systolic HF, uncontrolled DM II, NSVT, HTN, hx CVA, CKD.   He was recently admitted 02/06/37-4/66/59 for A/C systolic heart failure. Diuresed well with IV lasix/metolazone and discharged. He developed an AKI Cr >> 1.2 >>1.3>>1.75, Entresto and Arlyce Harman were held at the time and was instructed to restart the next day following discharge. He was seen by paramedicine the following day and he was in no acute distress, taking all meds. Denied CP, SOB and overall feeling well during their visit.   Presented to the ED 7/22 with 2 days of persistent N/V/D with intermittent CP and SOB. States he lost 40lbs in 2 days but at d/c his weight was 145, was seen by paramedicine the next day and weighted 145lbs then and is 146lb today. In the ED pt with AKI (Cr 3.3, was 1.7 at discharge) from frequent emesis/diarrhea so he was given 595m NS and entresto and spiro were held. Cr. 3.3, BNP 147, HsTrop 14>>15, CTA (-), CXR: no active cardiopulmonary disease, EKG NSR. Cr. 3.3>>2.6>>1.9. Overall responded well to 5037mNS bolus and holding of entresto and spiro.    Sitting on edge of bed. Denies CP and SOB.   Imaging reviewed: - Echo (10/22): EF 20-25%, RV ok - R/LHC (10/22): Patent RPLV stent with nonobstructive disease elsewhere, Preserved CO/CI, RA mean 5 mmHg, PCWP mean 5 mmHg - cMRI (11/22): LVEF 24% RVEF 21% LGE and ECW suggestive of cardiac amyloidosis. 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 I thought equivocal but based on cMRI likely positive Will need genetic testing. -  Multiple myeloma panel (12/22) with no m-spike. - ECHO (6/23) LVEF 30-35%, LV global hypokinesis, Grade 1 DD, RV function normal  Review of Systems: [y] = yes, '[ ]'  = no   General: Weight gain '[ ]' ; Weight loss [ Y]; Anorexia '[ ]' ; Fatigue '[ ]' ; Fever '[ ]' ; Chills '[ ]' ; Weakness '[ ]'   Cardiac: Chest pain/pressure [ Y]; Resting SOB '[ ]' ; Exertional SOB '[ ]' ; Orthopnea '[ ]' ; Pedal Edema '[ ]' ; Palpitations '[ ]' ; Syncope '[ ]' ; Presyncope '[ ]' ; Paroxysmal nocturnal dyspnea'[ ]'   Pulmonary: Cough '[ ]' ; Wheezing'[ ]' ; Hemoptysis'[ ]' ; Sputum '[ ]' ; Snoring '[ ]'   GI: Vomiting[ Y]; Dysphagia'[ ]' ; Melena'[ ]' ; Hematochezia '[ ]' ; Heartburn'[ ]' ; Abdominal pain [ Y]; Constipation '[ ]' ; Diarrhea [ Y]; BRBPR '[ ]'   GU: Hematuria'[ ]' ; Dysuria '[ ]' ; Nocturia'[ ]'   Vascular: Pain in legs with walking [ Y]; Pain in feet with lying flat '[ ]' ; Non-healing sores '[ ]' ; Stroke '[ ]' ; TIA '[ ]' ; Slurred speech '[ ]' ;  Neuro: Headaches'[ ]' ; Vertigo'[ ]' ; Seizures'[ ]' ; Paresthesias'[ ]' ;Blurred vision '[ ]' ; Diplopia '[ ]' ; Vision changes '[ ]'   Ortho/Skin: Arthritis '[ ]' ; Joint pain '[ ]' ; Muscle pain '[ ]' ; Joint swelling '[ ]' ; Back Pain '[ ]' ; Rash '[ ]'   Psych: Depression'[ ]' ; Anxiety'[ ]'   Heme: Bleeding problems '[ ]' ; Clotting disorders '[ ]' ; Anemia '[ ]'   Endocrine: Diabetes [Y ]; Thyroid dysfunction'[ ]'   Home Medications Prior to Admission medications   Medication Sig  Start Date End Date Taking? Authorizing Provider  aspirin EC 81 MG tablet Take 1 tablet (81 mg total) by mouth daily. Swallow whole. 12/11/21  Yes Earnie Larsson, NP  atorvastatin (LIPITOR) 80 MG tablet Take 1 tablet (80 mg total) by mouth daily. 12/10/21 07/08/22 Yes Earnie Larsson, NP  carvedilol (COREG) 6.25 MG tablet Take 1 tablet (6.25 mg total) by mouth EVERY 12 HOURS (10AM &10PM) 12/10/21 07/08/22 Yes Earnie Larsson, NP  clopidogrel (PLAVIX) 75 MG tablet Take 75 mg by mouth daily.   Yes [provider]  furosemide (LASIX) 40 MG tablet Take 1 tablet (40 mg total) by mouth daily. 12/11/21 07/09/22 Yes Earnie Larsson, NP   hydrALAZINE (APRESOLINE) 50 MG tablet Take 1 tablet (50 mg total) by mouth 3 (three) times daily. 12/10/21 07/08/22 Yes Earnie Larsson, NP  hydrocortisone cream 1 % Apply to affected area 2 times daily Patient taking differently: Apply 1 Application topically 2 (two) times daily as needed for itching. 12/10/21  Yes Earnie Larsson, NP  isosorbide mononitrate (IMDUR) 30 MG 24 hr tablet Take 1 tablet (30 mg total) by mouth daily. 12/10/21  Yes Earnie Larsson, NP  sacubitril-valsartan (ENTRESTO) 97-103 MG Take 1 tablet by mouth 2 (two) times daily. 12/11/21  Yes Earnie Larsson, NP  spironolactone (ALDACTONE) 25 MG tablet Take 1 tablet (25 mg total) by mouth daily. 12/11/21 07/09/22 Yes Earnie Larsson, NP  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 Take 1 capsule by mouth daily. 09/02/21  Yes Isaura Schiller, Shaune Pascal, MD    Past Medical History: Past Medical History:  Diagnosis Date   CHF (congestive heart failure) ([place])    Coronary artery disease    Diabetes mellitus without complication ([place])    History of MI (myocardial infarction) 03/05/2019   Hypertension    Stroke [facility])     Past Surgical History: Past Surgical History:  Procedure Laterality Date   leg surgery     "pins in left shin"   RIGHT/LEFT HEART CATH AND CORONARY ANGIOGRAPHY N/A 03/11/2021   Procedure: RIGHT/LEFT HEART CATH AND CORONARY ANGIOGRAPHY;  Surgeon: Early Osmond, MD;  Location: [facility];  Service: Cardiovascular;  Laterality: N/A;    Family History: History reviewed. No pertinent family history.  Social History: Social History   Socioeconomic History   Marital status: Single    Spouse name: Not on file   Number of children: Not on file   Years of education: Not on file   Highest education level: Not on file  Occupational History   Not on file  Tobacco Use   Smoking status: Former    Types: Cigarettes    Quit date: 10/06/2017    Years since quitting: 4.1   Smokeless tobacco: Never  Substance and Sexual Activity   Alcohol use: Not  Currently   Drug use: Yes    Types: Marijuana   Sexual activity: Not on file  Other Topics Concern   Not on file  Social History Narrative   Not on file   Social Determinants of Health   Financial Resource Strain: Low Risk  (03/10/2021)   Overall Financial Resource Strain (CARDIA)    Difficulty of Paying Living Expenses: Not very hard  Food Insecurity: Food Insecurity Present (12/07/2021)   Hunger Vital Sign    Worried About Running Out of Food in the Last Year: Sometimes true    Ran Out of Food in the Last Year: Never true  Transportation Needs: Unmet Transportation Needs (  08/25/2021)   PRAPARE - Hydrologist (Medical): Yes    Lack of Transportation (Non-Medical): Yes  Physical Activity: Not on file  Stress: Not on file  Social Connections: Not on file    Allergies:  Allergies  Allergen Reactions   Bee Venom     swelling    Objective:    Vital Signs:   Temp:  [97.5 F (36.4 C)-98.7 F (37.1 C)] 97.5 F (36.4 C) (07/24 1008) Pulse Rate:  [73-81] 75 (07/24 1008) Resp:  [9-20] 9 (07/24 1008) BP: (119-148)/(74-98) 141/80 (07/24 1008) SpO2:  [97 %-100 %] 100 % (07/24 1008) Weight:  [66.9 kg] 66.9 kg (07/24 0544) Last BM Date : 12/13/21  Weight change: Filed Weights   12/14/21 0544  Weight: 66.9 kg    Intake/Output:   Intake/Output Summary (Last 24 hours) at 12/14/2021 1052 Last data filed at 12/14/2021 6283 Gross per 24 hour  Intake 90.94 ml  Output 1300 ml  Net -1209.06 ml      Physical Exam    General:  Malnourished appearing. No respiratory difficulty HEENT: normal Neck: supple. No JVD . Carotids 2+ bilat; no bruits. No lymphadenopathy or thyromegaly appreciated. Cor: PMI nondisplaced. Regular rate & rhythm. No rubs, gallops or murmurs. Lungs: clear, diminished at bases Abdomen: soft, nontender, nondistended. No hepatosplenomegaly. No bruits or masses. Active bowel sounds. Extremities: no cyanosis, clubbing, rash,  Non-pitting edema BLE Neuro: alert & oriented x 3, cranial nerves grossly intact. moves all 4 extremities w/o difficulty. Affect pleasant.   Telemetry   80'S NSR (Personally reviewed)    EKG   NSR 81 bpm  Labs   Basic Metabolic Panel: Recent Labs  Lab 12/07/21 2032 12/08/21 0055 12/08/21 0517 12/09/21 0338 12/10/21 0252 12/12/21 1643 12/13/21 0640 12/14/21 0504  NA 139  --  140 140 138 135 136 132*  K 4.1  --  4.7 4.1 4.4 5.0 4.3 4.6  CL 106  --  101 103 99 97* 103 102  CO2 24  --  '27 28 29 26 24 23  ' GLUCOSE 135*  --  165* 145* 157* 184* 235* 134*  BUN 14  --  15 17 23* 41* 42* 34*  CREATININE 1.40*   < > 1.37* 1.35* 1.75* 3.34* 2.65* 1.92*  CALCIUM 8.5*  --  8.8* 8.4* 8.5* 9.3 8.5* 8.4*  MG 1.6*  --  2.0 1.9 1.9  --   --  1.9   < > = values in this interval not displayed.    Liver Function Tests: Recent Labs  Lab 12/07/21 2032 12/12/21 1946  AST 33 25  ALT 34 20  ALKPHOS 143* 117  BILITOT 0.9 0.4  PROT 6.9 8.3*  ALBUMIN 3.4* 4.1   Recent Labs  Lab 12/07/21 2032 12/12/21 1946  LIPASE 26 31   No results for input(s): "AMMONIA" in the last 168 hours.  CBC: Recent Labs  Lab 12/07/21 2032 12/08/21 0055 12/12/21 1643 12/13/21 0640 12/14/21 0504  WBC 7.4 6.6 7.4 5.9 5.0  NEUTROABS 4.3  --   --   --   --   HGB 12.6* 10.9* 15.7 14.2 13.0  HCT 39.0 33.4* 48.1 43.3 38.6*  MCV 88.2 87.2 86.0 86.3 84.3  PLT 431* 375 514* 410* 379    Cardiac Enzymes: No results for input(s): "CKTOTAL", "CKMB", "CKMBINDEX", "TROPONINI" in the last 168 hours.  BNP: BNP (last 3 results) Recent Labs    11/10/21 1824 12/07/21 1130 12/12/21 1643  BNP 319.8*  3,127.4* 147.4*    ProBNP (last 3 results) Recent Labs    02/03/21 0911  PROBNP 3,012.0*     CBG: Recent Labs  Lab 12/13/21 0810 12/13/21 1206 12/13/21 1702 12/13/21 2154 12/14/21 0548  GLUCAP 210* 140* 193* 220* 143*    Coagulation Studies: No results for input(s): "LABPROT", "INR" in the last  72 hours.   Imaging   No results found.   Medications:     Current Medications:  aspirin EC  81 mg Oral Daily   atorvastatin  80 mg Oral Daily   carvedilol  6.25 mg Oral BID WC   clopidogrel  75 mg Oral Daily   heparin  5,000 Units Subcutaneous Q8H   hydrALAZINE  50 mg Oral TID   insulin aspart  0-5 Units Subcutaneous QHS   insulin aspart  0-9 Units Subcutaneous TID WC   isosorbide mononitrate  30 mg Oral Daily   lidocaine  1 patch Transdermal Q24H   Tafamidis  61 mg Oral Daily    Infusions:    Patient Profile   Thomas West is a 62 y.o. male with history of CAD with prior MI and stent in 01/2019 at [facility] in [place] (report not available), cardiomyopathy/chronic systolic HF, uncontrolled DM II, HTN, hx CVA, CKD.  Thomas West was recently admitted 01/01/90-4/78/29 for A/C systolic HF, back again d/t 2 days of persistent N/V/D with chest pain and SOB.   Assessment/Plan   Chronic systolic HF/Cardiac amyloidosis - Onset not certain. He reports cardiomyopathy diagnosed just prior to MI while living in [place] a couple of years ago. - Echo (10/22): EF 20-25%, RV ok - R/LHC (10/22): Patent RPLV stent with nonobstructive disease elsewhere, Preserved CO/CI, RA mean 5 mmHg, PCWP mean 5 mmHg - cMRI (11/22): LVEF 24% RVEF 21% LGE and ECW suggestive of cardiac amyloidosis. 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 I thought equivocal but based on cMRI likely positive Will need genetic testing. - Multiple myeloma panel (12/22) with no m-spike. - ECHO (6/23) LVEF 30-35%, LV global hypokinesis, Grade 1 DD, RV function normal - Presented with NYHA III, physically limited by neuropathy in feet. On admission CP likely d/t frequent emesis, resolved. BNP 147, HsTrop 14 >>15. CTA (-), CXR- No active cardiopulmonary disease. - On assessment pt appears euvolemic. States good UOP. Weight has been unchanged since discharge.  - Continue hydralazine 50 mg tid and Imdur 30 mg daily.  - Hold Entresto  97/103 mg bid in setting of AKI - Continue carvedilol 6.25 mg bid.  - Hold spironolactone 25 mg daily in setting of AKI - No SGLT2i w/ A1c 12. - Await genetic testing.   - Continue tafamadis, and will likely benefit from [place] as well - Daily BMET. K 4.6, Cr 1.92 - Daily weights, strict I&O   2. CAD - Chest pain on admission, now denies CP. HsTrop 14>>15.  - Prior MI followed by PCI in [place] in either 2019 or 2020.  - Patent RPLV stent on [facility] 10/22. Also had 20% distal RCA, 30% p LAD, 70% d LAD, 60% OM2 - Continue ASA  - Stent placed 2020. >23yrsince stent placement, could consider stopping Plavix d/t issues with noncompliance. - Continue atorvastatin 80 mg daily.   3. HTN - Elevated. - Continue hydralizine 551mTID and Imdur 304maily   4. Uncontrolled DM - Needs PCP follow up. - On Januvia. - No SGLT2i with A1c 12.   5. PVCs - PVC's 0-1 per hr (Personally reviewed)  -  Denies palpitations. - ECG ok today.   6. Neuropathy - This seems to be his biggest limiter. - On gabapentin. - Referred to Dr. Posey Pronto in Neurology. Consider Amvuttra.   7. SDOH - He has Medicaid and disability. - Transportation is an issue, he has a car but unable to drive (needs license). - 64 year old son helps with meds. - Paramedicine follows outpatient  8. N/V/D - per primary - no N/V/D since yesterday - Hasn't eaten anything solid since admission per pt, encouraged to eat - Covid/Influenza PCR (-) - CT negative for colitis - Enteric precautions  9. 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 Today 1.92 - Continue to hold diuretics, entresto, and spiro - Daily BMET  Length of Stay: [place], AGACNP-BC  12/14/2021, 10:52 AM  Advanced Heart Failure Team Pager [phone] (M-F; 7a - 5p)  Please contact [facility] for night-coverage after hours (4p -7a ) and weekends on [email or web address]   Patient  seen and examined with the above-signed Advanced Practice Provider and/or Housestaff. I personally reviewed laboratory data, imaging studies and relevant notes. I independently examined the patient and formulated the important aspects of the plan. I have edited the note to reflect any of my changes or salient points. I have personally discussed the plan with the patient and/or family.  61 y/o male with systolic HF in setting of cardiac amyloidosis. REcently admitted for ADHF. Diuresed and discharged. Soon after d/c developed severe gastroenteritis with n/v/d. REadmitted with AKI.   General:  Lying bed  No resp difficulty HEENT: normal Neck: supple. no JVD. Carotids 2+ bilat; no bruits. No lymphadenopathy or thryomegaly appreciated. Cor: PMI nondisplaced. Regular rate & rhythm. No rubs, gallops or murmurs. Lungs: clear Abdomen: soft, nontender, nondistended. No hepatosplenomegaly. No bruits or masses. Good bowel sounds. Extremities: no cyanosis, clubbing, rash, edema Neuro: alert & orientedx3, cranial nerves grossly intact. moves all 4 extremities w/o difficulty. Affect pleasant  He is volume depleted. BP is now stable. Would continue to hold diuretics and Entresto/spiro Follow daily BMETs. D/w Dr. Bonner Puna at bedside.   Glori Bickers, MD  4:32 PM

## 2021-12-14 NOTE — Progress Notes (Signed)
Mobility Specialist: Progress Note   12/14/21 1553  Mobility  Activity Ambulated with assistance in hallway  Level of Assistance Contact guard assist, steadying assist  Assistive Device Front wheel walker  Distance Ambulated (ft) 240 ft  Activity Response Tolerated well  $Mobility charge 1 Mobility   Pre-Mobility: 71 HR, 128/75 (90) BP, 100% SpO2 Post-Mobility: 75 HR, 144/89 (106) BP, 100% SpO2  Pt received in the bed and agreeable to mobility. C/o mild dizziness as well as back and bilateral foot pain, no rating given. Pt to the chair after session with call bell and phone in reach. RN present in the room.   [facility] Shafer Swamy Mobility Specialist Mobility Specialist [address]: [phone]

## 2021-12-14 NOTE — Telephone Encounter (Signed)
He is currently in the hospital. Will plan on discussing this during next f/u. Aprill Banko [name], MD

## 2021-12-14 NOTE — TOC CM/SW Note (Addendum)
HF TOC CM sent consult to [facility]. Pt has outpatient HF Paramedicine following. HH arranged with [facility]. Will need HHRN and PT with F2F orders at dc. Will have new meds come up from [facility] pharmacy. Isidoro Donning RN3 CCM, Heart Failure TOC CM [phone]

## 2021-12-15 ENCOUNTER — Other Ambulatory Visit (HOSPITAL_COMMUNITY): Payer: Self-pay

## 2021-12-15 DIAGNOSIS — N179 Acute kidney failure, unspecified: Secondary | ICD-10-CM | POA: Diagnosis not present

## 2021-12-15 DIAGNOSIS — I2510 Atherosclerotic heart disease of native coronary artery without angina pectoris: Secondary | ICD-10-CM | POA: Diagnosis not present

## 2021-12-15 DIAGNOSIS — N1831 Chronic kidney disease, stage 3a: Secondary | ICD-10-CM | POA: Diagnosis not present

## 2021-12-15 DIAGNOSIS — R197 Diarrhea, unspecified: Secondary | ICD-10-CM | POA: Diagnosis not present

## 2021-12-15 DIAGNOSIS — R112 Nausea with vomiting, unspecified: Secondary | ICD-10-CM

## 2021-12-15 LAB — BASIC METABOLIC PANEL
Anion gap: 7 (ref 5–15)
BUN: 31 mg/dL — ABNORMAL HIGH (ref 6–20)
CO2: 25 mmol/L (ref 22–32)
Calcium: 8.4 mg/dL — ABNORMAL LOW (ref 8.9–10.3)
Chloride: 105 mmol/L (ref 98–111)
Creatinine, Ser: 1.68 mg/dL — ABNORMAL HIGH (ref 0.61–1.24)
GFR, Estimated: 47 mL/min — ABNORMAL LOW (ref >60)
Glucose, Bld: 123 mg/dL — ABNORMAL HIGH (ref 70–99)
Potassium: 4.3 mmol/L (ref 3.5–5.1)
Sodium: 137 mmol/L (ref 135–145)

## 2021-12-15 LAB — GLUCOSE, CAPILLARY
Glucose-Capillary: 119 mg/dL — ABNORMAL HIGH (ref 70–99)
Glucose-Capillary: 261 mg/dL — ABNORMAL HIGH (ref 70–99)

## 2021-12-15 LAB — MAGNESIUM: Magnesium: 2 mg/dL (ref 1.7–2.4)

## 2021-12-15 LAB — CBC
HCT: 36.5 % — ABNORMAL LOW (ref 39.0–52.0)
Hemoglobin: 12.5 g/dL — ABNORMAL LOW (ref 13.0–17.0)
MCH: 28.7 pg (ref 26.0–34.0)
MCHC: 34.2 g/dL (ref 30.0–36.0)
MCV: 83.7 fL (ref 80.0–100.0)
Platelets: 350 10*3/uL (ref 150–400)
RBC: 4.36 MIL/uL (ref 4.22–5.81)
RDW: 14.4 % (ref 11.5–15.5)
WBC: 5.1 10*3/uL (ref 4.0–10.5)
nRBC: 0 % (ref 0.0–0.2)

## 2021-12-15 MED ORDER — FUROSEMIDE 40 MG PO TABS: 40.0000 mg | ORAL_TABLET | Freq: Every day | ORAL | Status: DC

## 2021-12-15 MED ORDER — VITAMIN B-12 1000 MCG PO TABS
1000.0000 ug | ORAL_TABLET | Freq: Every day | ORAL | Status: DC
  Administered 2021-12-15: 1000 ug via ORAL
  Filled 2021-12-15: qty 1

## 2021-12-15 MED ORDER — ENTRESTO 97-103 MG PO TABS: 0.5000 | ORAL_TABLET | Freq: Two times a day (BID) | ORAL | Status: DC

## 2021-12-15 MED ORDER — SPIRONOLACTONE 12.5 MG HALF TABLET: 12.5000 mg | ORAL_TABLET | Freq: Every day | ORAL | Status: DC

## 2021-12-15 MED ORDER — SACUBITRIL-VALSARTAN 49-51 MG PO TABS
1.0000 | ORAL_TABLET | Freq: Two times a day (BID) | ORAL | Status: DC
Start: 2021-12-15 — End: 2021-12-15
  Administered 2021-12-15: 1 via ORAL
  Filled 2021-12-15: qty 1

## 2021-12-15 MED ORDER — CYANOCOBALAMIN 1000 MCG PO TABS
1000.0000 ug | ORAL_TABLET | Freq: Every day | ORAL | 0 refills | Status: DC
  Filled 2021-12-15: qty 30, 30d supply, fill #0

## 2021-12-15 MED ORDER — CYANOCOBALAMIN 1000 MCG PO TABS: 1000.0000 ug | ORAL_TABLET | Freq: Every day | ORAL | 0 refills | Status: DC

## 2021-12-15 NOTE — Progress Notes (Addendum)
Advanced Heart Failure Rounding Note  PCP-Cardiologist: Jenkins Rouge, MD   Subjective:   12/07/21-12/10/21- recent admission with A/C systolic HF. Diuresed well. At d/c Cr. 1.75.  N/V/D, AKI, post discharge. Held diuretics, entresto, spiro. Received 525m bolus on admit. Responded well to fluids.   Cr. 3.3>>2.6>>1.9>> 1.6  Eating breakfast. Denies CP and SOB.   Objective:   Weight Range: 67.6 kg Body mass index is 22.01 kg/m.   Vital Signs:   Temp:  [97.5 F (36.4 C)-98.8 F (37.1 C)] 98.3 F (36.8 C) (07/25 0439) Pulse Rate:  [75-87] 75 (07/25 0439) Resp:  [9-19] 15 (07/25 0439) BP: (108-144)/(76-93) 132/93 (07/25 0439) SpO2:  [96 %-100 %] 97 % (07/25 0439) Weight:  [67.6 kg] 67.6 kg (07/25 0439) Last BM Date : 12/13/21  Weight change: Filed Weights   12/14/21 0544 12/15/21 0439  Weight: 66.9 kg 67.6 kg    Intake/Output:   Intake/Output Summary (Last 24 hours) at 12/15/2021 0703 Last data filed at 12/15/2021 0644 Gross per 24 hour  Intake 240 ml  Output 1050 ml  Net -810 ml      Physical Exam  General:  well appearing. No respiratory difficulty HEENT: normal Neck: supple. No JVD. Carotids 2+ bilat; no bruits. No lymphadenopathy or thyromegaly appreciated. Cor: PMI nondisplaced. Regular rate & rhythm. No rubs, gallops or murmurs. Lungs: clear Abdomen: soft, nontender, nondistended. No hepatosplenomegaly. No bruits or masses. Active bowel sounds. Extremities: no cyanosis, clubbing, rash, edema  Neuro: alert & oriented x 3, cranial nerves grossly intact. moves all 4 extremities w/o difficulty. Affect pleasant.    Telemetry   NSR 80's (Personally reviewed)    EKG    No new EKG to review  Labs    CBC Recent Labs    12/14/21 0504 12/15/21 0140  WBC 5.0 5.1  HGB 13.0 12.5*  HCT 38.6* 36.5*  MCV 84.3 83.7  PLT 379 3216  Basic Metabolic Panel Recent Labs    12/14/21 0504 12/15/21 0140  NA 132* 137  K 4.6 4.3  CL 102 105  CO2 23 25   GLUCOSE 134* 123*  BUN 34* 31*  CREATININE 1.92* 1.68*  CALCIUM 8.4* 8.4*  MG 1.9 2.0   Liver Function Tests Recent Labs    12/12/21 1946  AST 25  ALT 20  ALKPHOS 117  BILITOT 0.4  PROT 8.3*  ALBUMIN 4.1   Recent Labs    12/12/21 1946  LIPASE 31   Cardiac Enzymes No results for input(s): "CKTOTAL", "CKMB", "CKMBINDEX", "TROPONINI" in the last 72 hours.  BNP: BNP (last 3 results) Recent Labs    11/10/21 1824 12/07/21 1130 12/12/21 1643  BNP 319.8* 3,127.4* 147.4*    ProBNP (last 3 results) Recent Labs    02/03/21 0911  PROBNP 3,012.0*     D-Dimer No results for input(s): "DDIMER" in the last 72 hours. Hemoglobin A1C Recent Labs    12/13/21 0640  HGBA1C 7.3*   Fasting Lipid Panel No results for input(s): "CHOL", "HDL", "LDLCALC", "TRIG", "CHOLHDL", "LDLDIRECT" in the last 72 hours. Thyroid Function Tests No results for input(s): "TSH", "T4TOTAL", "T3FREE", "THYROIDAB" in the last 72 hours.  Invalid input(s): "FREET3"  Other results:   Imaging    No results found.   Medications:     Scheduled Medications:  aspirin EC  81 mg Oral Daily   atorvastatin  80 mg Oral Daily   carvedilol  6.25 mg Oral BID WC   clopidogrel  75 mg Oral Daily  heparin  5,000 Units Subcutaneous Q8H   hydrALAZINE  50 mg Oral TID   insulin aspart  0-5 Units Subcutaneous QHS   insulin aspart  0-9 Units Subcutaneous TID WC   isosorbide mononitrate  30 mg Oral Daily   lidocaine  1 patch Transdermal Q24H   Tafamidis  61 mg Oral Daily    Infusions:   PRN Medications: acetaminophen **OR** acetaminophen, oxyCODONE-acetaminophen    Patient Profile   Thomas West is a 61 y.o. male with history of CAD with prior MI and stent in 01/2019 at [facility] in [place] (report not available), cardiomyopathy/chronic systolic HF, uncontrolled DM II, HTN, hx CVA, CKD.  Thomas West was recently admitted 2/58/52-7/78/24 for A/C systolic HF, back again d/t 2 days of persistent  N/V/D with chest pain and SOB.   Assessment/Plan  Chronic systolic HF/Cardiac amyloidosis - Onset not certain. He reports cardiomyopathy diagnosed just prior to MI while living in [place] a couple of years ago. - Echo (10/22): EF 20-25%, RV ok - R/LHC (10/22): Patent RPLV stent with nonobstructive disease elsewhere, Preserved CO/CI, RA mean 5 mmHg, PCWP mean 5 mmHg - cMRI (11/22): LVEF 24% RVEF 21% LGE and ECW suggestive of cardiac amyloidosis. 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 I thought equivocal but based on cMRI likely positive Will need genetic testing. - Multiple myeloma panel (12/22) with no m-spike. - ECHO (6/23) LVEF 30-35%, LV global hypokinesis, Grade 1 DD, RV function normal - Presented with NYHA III, physically limited by neuropathy in feet. On admission CP likely d/t frequent emesis, resolved. BNP 147, HsTrop 14 >>15. CTA (-), CXR- No active cardiopulmonary disease. - On assessment pt appears euvolemic. States good UOP. Weight stable.  - Continue hydralazine 50 mg tid and Imdur 30 mg daily.  - Resume Entresto 49/51 mg bid (home dose 97/103) - Continue carvedilol 6.25 mg bid.  - Hold spiro for now. Restart as outpatient - No SGLT2i w/ A1c 12. - Await genetic testing.   - Continue tafamadis, and will likely benefit from [place] as well (if familial) - Daily BMET. K 4.3, Cr 1.68    2. CAD - Chest pain on admission, now denies CP. HsTrop 14>>15.  - Prior MI followed by PCI in [place] in either 2019 or 2020.  - Patent RPLV stent on [facility] 10/22. Also had 20% distal RCA, 30% p LAD, 70% d LAD, 60% OM2 - Continue ASA  - Stent placed 2020. >76yrsince stent placement, could consider stopping Plavix d/t issues with noncompliance. - Continue atorvastatin 80 mg daily.   3. HTN - Elevated. - Continue hydralizine 533mTID and Imdur 3032maily   4. Uncontrolled DM - Needs PCP follow up. - On Januvia. - No SGLT2i with A1c 12.   5. PVCs - PVC's ~4/hr (Personally reviewed)  - 4  beat run over night, asymptomatic - Denies palpitations. - NSR on tele   6. Neuropathy - This seems to be his biggest limiter. - On gabapentin. - Referred to Dr. [facility]. Consider Amvuttra.   7. SDOH - He has Medicaid and disability. - Transportation is an issue, he has a car but unable to drive (needs license). - 13 20ar old son helps with meds. - Paramedicine follows outpatient   8. N/V/D - per primary - no N/V/D since 12/13/21 - Tolerating PO well - Covid/Influenza PCR (-) - CT negative for colitis   9. 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 Today 1.68 - Continue to hold diuretics, spiro, restart entresto - Daily BMET   Length of Stay: [place], AGACNP-BC  12/15/2021, 7:03 AM  Advanced Heart Failure Team Pager [phone] (M-F; 7a - 5p)  Please contact [facility] for night-coverage after hours (5p -7a ) and weekends on [email or web address]   Patient seen and examined with the above-signed Advanced Practice Provider and/or Housestaff. I personally reviewed laboratory data, imaging studies and relevant notes. I independently examined the patient and formulated the important aspects of the plan. I have edited the note to reflect any of my changes or salient points. I have personally discussed the plan with the patient and/or family.  Much improved. Renal function better. Volume looks good. No CP or SOB.   General:  Well appearing. No resp difficulty HEENT: normal Neck: supple. no JVD. Carotids 2+ bilat; no bruits. No lymphadenopathy or thryomegaly appreciated. Cor: PMI nondisplaced. Regular rate & rhythm. No rubs, gallops or murmurs. Lungs: clear Abdomen: soft, nontender, nondistended. No hepatosplenomegaly. No bruits or masses. Good bowel sounds. Extremities: no cyanosis, clubbing, rash, edema Neuro: alert & orientedx3, cranial nerves grossly intact. moves all 4 extremities  w/o difficulty. Affect pleasant  Ok for d/c today. Restart Entresto at half dose (49/51 bid) and hold spiro. Restart lasix 40 daily when weight hits 152 lbs. F/u HF Clinic.  Glori Bickers, MD  8:21 AM

## 2021-12-15 NOTE — Progress Notes (Signed)
Patient discharging home. IV removed without complications. Tele removed and CCMD notified. Discharge instructions given and medication administration discussed. All questions answered. Waiting on cab voucher and TOC meds.  [name]

## 2021-12-15 NOTE — Discharge Summary (Signed)
Physician Discharge Summary   Patient: Thomas West MRN: [id number] DOB: 01/31/1961  Admit date:     12/12/2021  Discharge date: 12/15/21  Discharge Physician: Patrecia Pour   PCP: [name], [name], MD   Recommendations at discharge:   Follow up with heart failure clinic with repeat volume assessment, and BMP.   Discharge Diagnoses: Principal Problem:   AKI (acute kidney injury) ([place]) Active Problems:   CAD (coronary artery disease)   Type 2 diabetes mellitus with diabetic neuropathy, unspecified (HCC)   HFrEF (heart failure with reduced ejection fraction) ([place])   Hypertension associated with diabetes ([place])   Chronic kidney disease, stage 3a (HCC)   Diarrhea   Shortness of breath   Chest pain   Nausea and vomiting  Hospital Course: Thomas West is a 61 y.o. 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 Recently admitted by cardiology service 7/17-7/20 for decompensated CHF in the setting of medication noncompliance (out of meds for about 2 months).  Diuresed 13 pounds with IV Lasix and metolazone.  Creatinine increased on the day of discharge so Entresto and spironolactone were held and scheduled to be restarted the following day.  He presents to the ED today via EMS with complaints of chest pain, shortness of breath, nausea, vomiting, and diarrhea.  Vital signs stable on arrival to the ED.  Labs significant for WBC 7.4, hemoglobin 15.7, platelet count 514k.  Sodium 135, potassium 5.0, chloride 97, bicarb 26, BUN 41, creatinine 3.3 (baseline 1.2-1.4), glucose 184.  High-sensitivity troponin negative x2. BNP 147.  Lipase and LFTs normal.  COVID and influenza PCR negative.  Chest x-ray showing no active cardiopulmonary disease.  CT abdomen pelvis negative for acute finding. Patient was given Zofran and 500 cc  normal saline bolus.With supportive care the patient's GI symptoms abated, renal function has sustained improvement and he is now tolerating a diet well. Heart failure team was involved in his care again and have made recommendations regarding cardiac medications, specifically to hold spironolactone and lasix for now, decrease entresto. Patient will restart lasix if/when he gains back to his dry weight and/or notices swelling. At follow up, these changes can be modified as appropriate.   Assessment and Plan: AKI on stage IIIa CKD: Prerenal most likely due to volume depletion compounded by GDMT for CHF.  - Improving while holding diuretic/ARNI. CrCl improved to 45.68ml/min, near his baseline, and can be rechecked at follow up.    Diarrhea and vomiting: Unclear etiology, though symptoms have resolved spontaneously.    Chronic systolic CHF/cardiac amyloidosis -Echo done 11/02/2021 showing EF 30 to [id number], grade 1 diastolic dysfunction.  Discharged 3 days ago after being diuresed 13 pounds.  BNP 147.  At present, he appears dry in the setting of vomiting and diarrhea. -Hold Lasix until weight rises per HF team. Pt in understanding. Holding spironolactone at discharge pending follow up and will take half dose of entresto. Continue other medications.  -Monitor volume status closely.   CAD -Continue aspirin, Coreg, Imdur, and Lipitor   Hypertension -Stable. -Continue Coreg, hydralazine, and Imdur    Poorly controlled type 2 diabetes with neuropathy: HbA1c now 7.3%. - Would ideally by on SGLT2i, currently limited by AKI - Continue SSI   History of CVA -Continue aspirin and statin    Consultants: Heart failure team Procedures performed: None  Disposition: Home  Diet recommendation:  Discharge Diet Orders (From admission, onward)     Start     Ordered   12/15/21 0000  Diet - low sodium heart healthy        12/15/21 1039           DISCHARGE MEDICATION: Allergies as of 12/15/2021        Reactions   Bee Venom    swelling        Medication List     STOP taking these medications    spironolactone 25 MG tablet Commonly known as: ALDACTONE       TAKE these medications    Aspirin Low Dose 81 MG tablet Generic drug: aspirin EC Take 1 tablet (81 mg total) by mouth daily. Swallow whole.   atorvastatin 80 MG tablet Commonly known as: LIPITOR Take 1 tablet (80 mg total) by mouth daily.   carvedilol 6.25 MG tablet Commonly known as: COREG Take 1 tablet (6.25 mg total) by mouth EVERY 12 HOURS (10AM &10PM)   clopidogrel 75 MG tablet Commonly known as: PLAVIX Take 75 mg by mouth daily.   cyanocobalamin 1000 MCG tablet Take 1 tablet (1,000 mcg total) by mouth daily. Start taking on: December 16, 2021   Entresto 97-103 MG Generic drug: sacubitril-valsartan Take 0.5 tablets by mouth 2 (two) times daily. What changed: how much to take   furosemide 40 MG tablet Commonly known as: Lasix Take 1 tablet (40 mg total) by mouth daily. Resume once weight comes up to 152lbs. What changed: additional instructions   hydrALAZINE 50 MG tablet Commonly known as: APRESOLINE Take 1 tablet (50 mg total) by mouth 3 (three) times daily.   hydrocortisone cream 1 % Apply to affected area 2 times daily What changed:  how much to take how to take this when to take this reasons to take this additional instructions   isosorbide mononitrate 30 MG 24 hr tablet Commonly known as: IMDUR Take 1 tablet (30 mg total) by mouth daily.   Vyndamax 61 MG Caps Generic drug: Tafamidis Take 1 capsule by mouth daily.        Follow-up Information     [facility] Follow up.   Why: Home Health RN and Physical Therapy-agency will call to arrange appt Contact information: [phone]        [name], [name] Expose, MD. Schedule an appointment as soon as possible for a visit.   Specialty: Family Medicine Contact information: [address] [place] [address] [phone]          Wendall Stade, MD .   Specialty: Cardiology Contact information: [phone] [address] [place] [address] [phone]         Bensimhon, Bevelyn Buckles, MD Follow up.   Specialty: Cardiology Contact information: [address] [place] [address] [phone]                Discharge Exam: Ceasar Mons Weights   12/14/21 0544 12/15/21 0439  Weight: 66.9 kg 67.6 kg  BP (!) 152/92   Pulse 82   Temp 98.7 F (37.1 C) (Oral)   Resp 18   Ht 5\' 9"  (1.753 m)   Wt 67.6 kg   SpO2 96%   BMI 22.01 kg/m   No distress, well appearing Clear, nonlabored RRR, no MRG, no significant dependent edema or JVD Soft, NT, ND  Condition at discharge: stable  The results of significant diagnostics from this hospitalization (including imaging, microbiology, ancillary and laboratory)  are listed below for reference.   Imaging Studies: CT ABDOMEN PELVIS WO CONTRAST  Result Date: 12/13/2021 CLINICAL DATA:  Abdominal pain EXAM: CT ABDOMEN AND PELVIS WITHOUT CONTRAST TECHNIQUE: Multidetector CT imaging of the abdomen and pelvis was performed following the standard protocol without IV contrast. RADIATION DOSE REDUCTION: This exam was performed according to the departmental dose-optimization program which includes automated exposure control, adjustment of the mA and/or kV according to patient size and/or use of iterative reconstruction technique. COMPARISON:  None Available. FINDINGS: Lower chest: Lung bases are clear. Hepatobiliary: Unenhanced liver is unremarkable. Gallbladder is unremarkable. No intrahepatic or extrahepatic duct dilatation. Pancreas: Within normal limits. Spleen: Within normal limits. Adrenals/Urinary Tract: 14 mm right adrenal nodule (series 3/image 11), likely reflecting a benign adrenal adenoma. No follow-up is recommended. Left adrenal gland is within normal limits. Kidneys are within normal limits. No renal calculi or hydronephrosis. Mildly  thick-walled bladder, although underdistended. Stomach/Bowel: Stomach is within normal limits. No evidence of bowel obstruction. Normal appendix (series 3/image 47). No colonic wall thickening or inflammatory changes. Vascular/Lymphatic: No evidence of abdominal aortic aneurysm. Atherosclerotic calcifications of the abdominal aorta and branch vessels. No suspicious abdominopelvic lymphadenopathy. Reproductive: Prostate is notable for enlargement of the central gland, suggesting BPH. Other: No abdominopelvic ascites. Tiny fat containing left inguinal hernia (series 3/image 64). Musculoskeletal: Visualized osseous structures are within normal limits. IMPRESSION: No CT findings to account for the patient's abdominal pain. Electronically Signed   By: Charline Bills M.D.   On: 12/13/2021 01:10   DG Chest 2 View  Result Date: 12/12/2021 CLINICAL DATA:  Shortness of breath EXAM: CHEST - 2 VIEW COMPARISON:  12/07/2021 FINDINGS: Normal heart size and pulmonary vascularity. No focal airspace disease or consolidation in the lungs. No blunting of costophrenic angles. No pneumothorax. Mediastinal contours appear intact. Calcification of the aorta. IMPRESSION: No active cardiopulmonary disease. Electronically Signed   By: Burman Nieves M.D.   On: 12/12/2021 17:43   CT Head Wo Contrast  Result Date: 12/08/2021 CLINICAL DATA:  Trauma. EXAM: CT HEAD WITHOUT CONTRAST TECHNIQUE: Contiguous axial images were obtained from the base of the skull through the vertex without intravenous contrast. RADIATION DOSE REDUCTION: This exam was performed according to the departmental dose-optimization program which includes automated exposure control, adjustment of the mA and/or kV according to patient size and/or use of iterative reconstruction technique. COMPARISON:  Head CT dated 03/12/2021. FINDINGS: Brain: Mild age-related atrophy and chronic microvascular ischemic changes. There is no acute intracranial hemorrhage. No mass effect  or midline shift. No extra-axial fluid collection. Small calcified meningioma along the left tentorium measures approximately 6 mm. Vascular: No hyperdense vessel or unexpected calcification. Skull: Normal. Negative for fracture or focal lesion. Sinuses/Orbits: Diffuse mucoperiosteal thickening of paranasal sinuses with partial opacification of the right maxillary sinus. The mastoid air cells are clear. Other: None IMPRESSION: 1. No acute intracranial pathology. 2. Mild age-related atrophy and chronic microvascular ischemic changes. 3. Small calcified meningioma along the left tentorium. Electronically Signed   By: Elgie Collard M.D.   On: 12/08/2021 03:20   CT Lumbar Spine Wo Contrast  Result Date: 12/08/2021 CLINICAL DATA:  Fall EXAM: CT LUMBAR SPINE WITHOUT CONTRAST TECHNIQUE: Multidetector CT imaging of the lumbar spine was performed without intravenous contrast administration. Multiplanar CT image reconstructions were also generated. RADIATION DOSE REDUCTION: This exam was performed according to the departmental dose-optimization program which includes automated exposure control, adjustment of the mA and/or kV according to patient size and/or use of iterative reconstruction technique. COMPARISON:  None Available. FINDINGS: Segmentation: 5 lumbar type vertebrae. Alignment: Normal. Vertebrae: No acute fracture or focal pathologic process. Paraspinal and other soft tissues: Calcific aortic atherosclerosis. Disc levels: No spinal canal stenosis. IMPRESSION: 1. No acute fracture or static subluxation of the lumbar spine. 2. Aortic Atherosclerosis (ICD10-I70.0). Electronically Signed   By: Ulyses Jarred M.D.   On: 12/08/2021 03:18   DG Chest 1 View  Result Date: 12/07/2021 CLINICAL DATA:  Fall with chest pain EXAM: CHEST  1 VIEW COMPARISON:  11/10/2021 FINDINGS: Cardiomegaly. Central pulmonary arteries appear enlarged. No pleural effusion or pneumothorax. IMPRESSION: 1. Mild cardiomegaly. 2. Prominent  central pulmonary arteries suggestive of arterial hypertension Electronically Signed   By: Donavan Foil M.D.   On: 12/07/2021 21:03   DG Lumbar Spine Complete  Result Date: 12/07/2021 CLINICAL DATA:  Fall EXAM: LUMBAR SPINE - COMPLETE 4+ VIEW COMPARISON:  CT 08/03/2021 FINDINGS: Alignment within normal limits. Vertebral body heights are maintained. The disc spaces are patent. Aortic atherosclerosis. IMPRESSION: No acute osseous abnormality Electronically Signed   By: Donavan Foil M.D.   On: 12/07/2021 21:03    Microbiology: Results for orders placed or performed during the hospital encounter of 12/12/21  Resp Panel by RT-PCR (Flu A&B, Covid) Anterior Nasal Swab     Status: None   Collection Time: 12/13/21 12:11 AM   Specimen: Anterior Nasal Swab  Result Value Ref Range Status   SARS Coronavirus 2 by RT PCR NEGATIVE NEGATIVE Final    Comment: (NOTE) SARS-CoV-2 target nucleic acids are NOT DETECTED.  The SARS-CoV-2 RNA is generally detectable in upper respiratory specimens during the acute phase of infection. The lowest concentration of SARS-CoV-2 viral copies this assay can detect is 138 copies/mL. A negative result does not preclude SARS-Cov-2 infection and should not be used as the sole basis for treatment or other patient management decisions. A negative result may occur with  improper specimen collection/handling, submission of specimen other than nasopharyngeal swab, presence of viral mutation(s) within the areas targeted by this assay, and inadequate number of viral copies(<138 copies/mL). A negative result must be combined with clinical observations, patient history, and epidemiological information. The expected result is Negative.  Fact Sheet for Patients:  EntrepreneurPulse.com.au  Fact Sheet for Healthcare Providers:  IncredibleEmployment.be  This test is no t yet approved or cleared by the [place] FDA and  has been authorized  for detection and/or diagnosis of SARS-CoV-2 by FDA under an Emergency Use Authorization (EUA). This EUA will remain  in effect (meaning this test can be used) for the duration of the COVID-19 declaration under Section 564(b)(1) of the Act, 21 U.S.C.section 360bbb-3(b)(1), unless the authorization is terminated  or revoked sooner.       Influenza A by PCR NEGATIVE NEGATIVE Final   Influenza B by PCR NEGATIVE NEGATIVE Final    Comment: (NOTE) The Xpert Xpress SARS-CoV-2/FLU/RSV plus assay is intended as an aid in the diagnosis of influenza from Nasopharyngeal swab specimens and should not be used as a sole basis for treatment. Nasal washings and aspirates are unacceptable for Xpert Xpress SARS-CoV-2/FLU/RSV testing.  Fact Sheet for Patients: EntrepreneurPulse.com.au  Fact Sheet for Healthcare Providers: IncredibleEmployment.be  This test is not yet approved or cleared by the [place] FDA and has been authorized for detection and/or diagnosis of SARS-CoV-2 by FDA under an Emergency Use Authorization (EUA). This EUA will remain in effect (meaning this test can be used) for the duration of the COVID-19 declaration under Section 564(b)(1) of the Act, 21  U.S.C. section 360bbb-3(b)(1), unless the authorization is terminated or revoked.  Performed at [facility], [place] [address] [place], [place] [address]     Labs: CBC: Recent Labs  Lab 12/12/21 1643 12/13/21 0640 12/14/21 0504 12/15/21 0140  WBC 7.4 5.9 5.0 5.1  HGB 15.7 14.2 13.0 12.5*  HCT 48.1 43.3 38.6* 36.5*  MCV 86.0 86.3 84.3 83.7  PLT 514* 410* 379 [id number]   Basic Metabolic Panel: Recent Labs  Lab 12/09/21 0338 12/10/21 0252 12/12/21 1643 12/13/21 0640 12/14/21 0504 12/15/21 0140  NA 140 138 135 136 132* 137  K 4.1 4.4 5.0 4.3 4.6 4.3  CL 103 99 97* 103 102 105  CO2 28 29 26 24 23 25   GLUCOSE 145* 157* 184* 235* 134* 123*  BUN 17 23* 41* 42* 34* 31*   CREATININE 1.35* 1.75* 3.34* 2.65* 1.92* 1.68*  CALCIUM 8.4* 8.5* 9.3 8.5* 8.4* 8.4*  MG 1.9 1.9  --   --  1.9 2.0   Liver Function Tests: Recent Labs  Lab 12/12/21 1946  AST 25  ALT 20  ALKPHOS 117  BILITOT 0.4  PROT 8.3*  ALBUMIN 4.1   CBG: Recent Labs  Lab 12/13/21 2154 12/14/21 0548 12/14/21 1607 12/14/21 2113 12/15/21 0629  GLUCAP 220* 143* 158* 230* 119*    Discharge time spent: greater than 30 minutes.  Signed: Patrecia Pour, MD [facility] 12/15/2021

## 2021-12-15 NOTE — TOC Initial Note (Signed)
Transition of Care [facility]) - Initial/Assessment Note    Patient Details  Name: Thomas West MRN: [id number] Date of Birth: [birth year]  Transition [facility] Care [facility]) CM/SW Contact:    Elliot Cousin, RN Phone Number: [phone] 12/15/2021, 10:09 AM  Clinical Narrative:                 HF TOC CM spoke to pt at bedside. States he will need assistance with getting home. Provided pt with taxi voucher to get home. He drives to his appts. Pt meds to come up from [facility] pharmacy. States he has meds from previous admission. His HH is set up with [facility]. Contacted [facility] to make aware of dc home today. Requested attending put in [facility] orders. Pt has [employer] at home. His teenage son at home to assist as needed at home.   Expected Discharge Plan: Home w Home Health Services Barriers to Discharge: No Barriers Identified   Patient Goals and CMS Choice   CMS Medicare.gov Compare Post Acute Care list provided to:: Patient    Expected Discharge Plan and Services Expected Discharge Plan: Home w Home Health Services   Discharge Planning Services: CM Consult Post Acute Care Choice: Home Health Living arrangements for the past 2 months: Apartment Expected Discharge Date: 12/15/21                           [facility] Agency: [facility] Date HH Agency Contacted: 12/15/21 Time HH Agency Contacted: 1008 Representative spoke with at [facility] Agency: Barbara Cower  Prior Living Arrangements/Services Living arrangements for the past 2 months: Apartment Lives with:: Minor Children Patient language and need for interpreter reviewed:: Yes Do you feel safe going back to the place where you live?: Yes      Need for Family Participation in Patient Care: No (Comment) Care giver support system in place?: No (comment) Current home services: DME (rollator) Criminal Activity/Legal Involvement Pertinent to Current Situation/Hospitalization: No - Comment as needed  Activities of Daily Living Home  Assistive Devices/Equipment: Wheelchair ADL Screening (condition at time of admission) Patient's cognitive ability adequate to safely complete daily activities?: Yes Is the patient deaf or have difficulty hearing?: No Does the patient have difficulty seeing, even when wearing glasses/contacts?: No Does the patient have difficulty concentrating, remembering, or making decisions?: No Patient able to express need for assistance with ADLs?: Yes Does the patient have difficulty dressing or bathing?: No Independently performs ADLs?: Yes (appropriate for developmental age) Does the patient have difficulty walking or climbing stairs?: Yes Weakness of Legs: Both Weakness of Arms/Hands: None  Permission Sought/Granted Permission sought to share information with : Case Manager, Family Supports, PCP Permission granted to share information with : Yes, Verbal Permission Granted  Share Information with NAME: Kym Groom     Permission granted to share info w Relationship: sister  Permission granted to share info w Contact Information: [phone]  Emotional Assessment Appearance:: Appears stated age Attitude/Demeanor/Rapport: Engaged Affect (typically observed): Accepting Orientation: : Oriented to Self, Oriented to Place, Oriented to  Time, Oriented to Situation   Psych Involvement: No (comment)  Admission diagnosis:  Dehydration [E86.0] AKI (acute kidney injury) (HCC) [N17.9] Nausea vomiting and diarrhea [R11.2, R19.7] Patient Active Problem List   Diagnosis Date Noted   AKI (acute kidney injury) (HCC) 12/13/2021   Diarrhea 12/13/2021   Shortness of breath 12/13/2021   Chest pain 12/13/2021   Nausea and vomiting 12/13/2021   Heart failure (  HCC) 12/08/2021   Hypertension associated with diabetes (HCC) 07/12/2021   Chronic kidney disease, stage 3a (HCC) 07/12/2021   Hyperkalemia 07/12/2021   CHF exacerbation (HCC) 04/20/2021   Acute on chronic combined systolic and diastolic CHF  (congestive heart failure) (HCC) 03/09/2021   Acute on chronic HFrEF (heart failure with reduced ejection fraction) (HCC) 03/08/2021   HFrEF (heart failure with reduced ejection fraction) (HCC) 02/03/2021   Diabetes mellitus (HCC) 03/27/2019   Hyperlipidemia associated with type 2 diabetes mellitus (HCC) 03/27/2019   CAD (coronary artery disease) 03/05/2019   History of MI (myocardial infarction) 03/05/2019   Type 2 diabetes mellitus with diabetic neuropathy, unspecified (HCC) 03/05/2019   CKD (chronic kidney disease), stage II 03/05/2019   HLD (hyperlipidemia) 03/05/2019   GERD (gastroesophageal reflux disease) 03/05/2019   Hypertension, essential, benign 03/05/2019   PCP:  [name], [name], MD Pharmacy:   [facility] [place], [place] - [address] [facility] [address] [place] [address] Phone: [phone] Fax: [phone]  [id number] [place], [place] - [address] AT [facility] [address] [place] [address] Phone: [phone] Fax: [phone]  [facility] [address] [place] [address] Phone: [phone] Fax: [phone]     Social Determinants of Health (SDOH) Interventions    Readmission Risk Interventions    07/14/2021    4:34 PM  Readmission Risk Prevention Plan  Transportation Screening Complete  HRI or Home Care Consult Complete  Social Work Consult for Recovery Care Planning/Counseling Complete  Palliative Care Screening Not Applicable  Medication Review Oceanographer) Complete

## 2021-12-16 ENCOUNTER — Other Ambulatory Visit (HOSPITAL_COMMUNITY): Payer: Self-pay | Admitting: Emergency Medicine

## 2021-12-16 ENCOUNTER — Other Ambulatory Visit: Payer: Self-pay | Admitting: *Deleted

## 2021-12-16 ENCOUNTER — Telehealth: Payer: Self-pay

## 2021-12-16 NOTE — Progress Notes (Signed)
Paramedicine Encounter    Patient ID: Thomas West, male    DOB: 04/19/1961, 61 y.o.   MRN: [id number]   BP (!) 140/100 (BP Location: Right Arm, Patient Position: Sitting, Cuff Size: Normal)   Pulse 85   Resp 16   Wt 149 lb 6.4 oz (67.8 kg)   SpO2 95%   BMI 22.06 kg/m  Weight yesterday-not taken Last visit weight-146lb  CBG 197 [name] was hospitalized over the weekend after severe nause, vomiting and diarrhea.  Admitted Saturday and discharged Tuesday.  Made home visit today and made the following changes to his pill box based on discharge instructions.   Removed Lasix and Cleda Daub.  Changed Entresto 97-103 to 1/2 table BID.  He has been instructed to wait until his weight reaches 152lbs before he restarts his Lasix.  No chest pain or SOB.  Lung sounds clear and equal. No edema noted.  Home visit complete.   Patient Care Team: [name], [name], MD as PCP - General (Family Medicine) Thomas Stade, MD as PCP - Cardiology (Cardiology)  Patient Active Problem List   Diagnosis Date Noted   AKI (acute kidney injury) (HCC) 12/13/2021   Diarrhea 12/13/2021   Shortness of breath 12/13/2021   Chest pain 12/13/2021   Nausea and vomiting 12/13/2021   Heart failure (HCC) 12/08/2021   Hypertension associated with diabetes (HCC) 07/12/2021   Chronic kidney disease, stage 3a (HCC) 07/12/2021   Hyperkalemia 07/12/2021   CHF exacerbation (HCC) 04/20/2021   Acute on chronic combined systolic and diastolic CHF (congestive heart failure) (HCC) 03/09/2021   Acute on chronic HFrEF (heart failure with reduced ejection fraction) (HCC) 03/08/2021   HFrEF (heart failure with reduced ejection fraction) (HCC) 02/03/2021   Diabetes mellitus (HCC) 03/27/2019   Hyperlipidemia associated with type 2 diabetes mellitus (HCC) 03/27/2019   CAD (coronary artery disease) 03/05/2019   History of MI (myocardial infarction) 03/05/2019   Type 2 diabetes mellitus with diabetic neuropathy, unspecified (HCC)  03/05/2019   CKD (chronic kidney disease), stage II 03/05/2019   HLD (hyperlipidemia) 03/05/2019   GERD (gastroesophageal reflux disease) 03/05/2019   Hypertension, essential, benign 03/05/2019    Current Outpatient Medications:    aspirin EC 81 MG tablet, Take 1 tablet (81 mg total) by mouth daily. Swallow whole., Disp: 30 tablet, Rfl: 12   atorvastatin (LIPITOR) 80 MG tablet, Take 1 tablet (80 mg total) by mouth daily., Disp: 30 tablet, Rfl: 6   carvedilol (COREG) 6.25 MG tablet, Take 1 tablet (6.25 mg total) by mouth EVERY 12 HOURS (10AM &10PM), Disp: 60 tablet, Rfl: 6   clopidogrel (PLAVIX) 75 MG tablet, Take 75 mg by mouth daily., Disp: , Rfl:    cyanocobalamin 1000 MCG tablet, Take 1 tablet (1,000 mcg total) by mouth daily., Disp: 30 tablet, Rfl: 0   furosemide (LASIX) 40 MG tablet, Take 1 tablet (40 mg total) by mouth daily. Resume once weight comes up to 152lbs., Disp: , Rfl:    hydrALAZINE (APRESOLINE) 50 MG tablet, Take 1 tablet (50 mg total) by mouth 3 (three) times daily., Disp: 90 tablet, Rfl: 6   hydrocortisone cream 1 %, Apply to affected area 2 times daily (Patient taking differently: Apply 1 Application topically 2 (two) times daily as needed for itching.), Disp: 28 West, Rfl: 0   isosorbide mononitrate (IMDUR) 30 MG 24 hr tablet, Take 1 tablet (30 mg total) by mouth daily., Disp: 30 tablet, Rfl: 6   sacubitril-valsartan (ENTRESTO) 97-103 MG, Take 0.5 tablets by mouth 2 (  two) times daily., Disp: , Rfl:    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, Take 1 capsule by mouth daily., Disp: 30 capsule, Rfl: 11 Allergies  Allergen Reactions   Bee Venom     swelling      Social History   Socioeconomic History   Marital status: Single    Spouse name: Not on file   Number of children: Not on file   Years of education: Not on file   Highest education level: Not on file  Occupational History   Not on file  Tobacco Use   Smoking status: Former    Types: Cigarettes    Quit date: 10/06/2017     Years since quitting: 4.1   Smokeless tobacco: Never  Substance and Sexual Activity   Alcohol use: Not Currently   Drug use: Yes    Types: Marijuana   Sexual activity: Not on file  Other Topics Concern   Not on file  Social History Narrative   Not on file   Social Determinants of Health   Financial Resource Strain: Low Risk  (03/10/2021)   Overall Financial Resource Strain (CARDIA)    Difficulty of Paying Living Expenses: Not very hard  Food Insecurity: Food Insecurity Present (12/07/2021)   Hunger Vital Sign    Worried About Running Out of Food in the Last Year: Sometimes true    Ran Out of Food in the Last Year: Never true  Transportation Needs: Unmet Transportation Needs (08/25/2021)   PRAPARE - Administrator, Civil Service (Medical): Yes    Lack of Transportation (Non-Medical): Yes  Physical Activity: Not on file  Stress: Not on file  Social Connections: Not on file  Intimate Partner Violence: Not on file    Physical Exam      Future Appointments  Date Time Provider Department Center  12/22/2021 11:00 AM [name], [name], MD [facility]  12/28/2021  3:00 PM Suanne Marker, MD [facility] None  01/04/2022  9:30 AM [facility] PA/NP [facility] None       Beatrix Shipper, EMT-Paramedic [phone] [facility] Paramedic  12/16/21

## 2021-12-16 NOTE — Progress Notes (Signed)
Revisited pt and delivered pill box and reconciled medications into same.  His 14 yrs son has been giving his dad his meds from pill bottles.  Pt and son agreed that the pill box would be much more efficient so they would not have to go into each pill bottle daily.    Visit complete

## 2021-12-16 NOTE — Telephone Encounter (Signed)
Transition Care Management Follow-up Telephone Call Date of discharge and from where: [facility] 12/15/21 How have you been since you were released from the hospital? Good Any questions or concerns? No  Items Reviewed: Did the pt receive and understand the discharge instructions provided? Yes  Medications obtained and verified? Yes  Other? Yes  Any new allergies since your discharge? No  Dietary orders reviewed? Yes Do you have support at home? Yes   Home Care and Equipment/Supplies: Were home health services ordered? no If so, what is the name of the agency?   Has the agency set up a time to come to the patient's home?  Were any new equipment or medical supplies ordered?  No What is the name of the medical supply agency?  Were you able to get the supplies/equipment?  Do you have any questions related to the use of the equipment or supplies?     Follow up appointments reviewed:  PCP Hospital f/u appt confirmed? Yes  Scheduled to see Dr [name] on 12/22/21 @ 11a. Specialist Hospital f/u appt confirmed?  Scheduled to see  on  @ . Are transportation arrangements needed? No  If their condition worsens, is the pt aware to call PCP or go to the Emergency Dept.? Yes Was the patient provided with contact information for the PCP's office or ED? Yes Was to pt encouraged to call back with questions or concerns? Yes

## 2021-12-16 NOTE — Patient Outreach (Signed)
  Care Coordination [facility] Note Transition Care Management Follow-up Telephone Call Date of discharge and from where: 12/15/21 [facility] How have you been since you were released from the hospital? Patient states the he feels good. Any questions or concerns? No  Items Reviewed: Did the pt receive and understand the discharge instructions provided? Yes  Medications obtained and verified? No  Other? Yes  Any new allergies since your discharge? No  Dietary orders reviewed? Yes Do you have support at home? Yes   Home Care and Equipment/Supplies: Were home health services ordered? no If so, what is the name of the agency? N/A  Has the agency set up a time to come to the patient's home? not applicable Were any new equipment or medical supplies ordered?  No What is the name of the medical supply agency? N/A Were you able to get the supplies/equipment? not applicable Do you have any questions related to the use of the equipment or supplies? No  Functional Questionnaire: (I = Independent and D = Dependent) ADLs: I  Bathing/Dressing- I  Meal Prep- I  Eating- I  Maintaining continence- I  Transferring/Ambulation- I  Managing Meds- I  Follow up appointments reviewed:  PCP Hospital f/u appt confirmed? Yes  Scheduled to see Dr. [name] on 12/22/21 @ 1100. Specialist Hospital f/u appt confirmed? No   Are transportation arrangements needed? No  If their condition worsens, is the pt aware to call PCP or go to the Emergency Dept.? Yes Was the patient provided with contact information for the PCP's office or ED? Yes Was to pt encouraged to call back with questions or concerns? Yes  SDOH assessments and interventions completed:   Yes  Care Coordination Interventions Activated:  No Care Coordination Interventions:   N/A  Encounter Outcome:  Pt. Visit Completed  Blanchie Serve RN, BSN [facility] Care Management [facility] [phone] [email or web address]

## 2021-12-21 NOTE — Progress Notes (Deleted)
HPI:  Thomas West is a 61 y.o. male, who is here today to follow on recent hospital visit.  Review of Systems Rest see pertinent positives and negatives per HPI.  Current Outpatient Medications on File Prior to Visit  Medication Sig Dispense Refill   aspirin EC 81 MG tablet Take 1 tablet (81 mg total) by mouth daily. Swallow whole. 30 tablet 12   atorvastatin (LIPITOR) 80 MG tablet Take 1 tablet (80 mg total) by mouth daily. 30 tablet 6   carvedilol (COREG) 6.25 MG tablet Take 1 tablet (6.25 mg total) by mouth EVERY 12 HOURS (10AM &10PM) 60 tablet 6   clopidogrel (PLAVIX) 75 MG tablet Take 75 mg by mouth daily.     cyanocobalamin 1000 MCG tablet Take 1 tablet (1,000 mcg total) by mouth daily. 30 tablet 0   furosemide (LASIX) 40 MG tablet Take 1 tablet (40 mg total) by mouth daily. Resume once weight comes up to 152lbs.     hydrALAZINE (APRESOLINE) 50 MG tablet Take 1 tablet (50 mg total) by mouth 3 (three) times daily. 90 tablet 6   hydrocortisone cream 1 % Apply to affected area 2 times daily (Patient taking differently: Apply 1 Application topically 2 (two) times daily as needed for itching.) 28 g 0   isosorbide mononitrate (IMDUR) 30 MG 24 hr tablet Take 1 tablet (30 mg total) by mouth daily. 30 tablet 6   sacubitril-valsartan (ENTRESTO) 97-103 MG Take 0.5 tablets by mouth 2 (two) times daily.     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 30 capsule 11   No current facility-administered medications on file prior to visit.    Past Medical History:  Diagnosis Date   CHF (congestive heart failure) (HCC)    Coronary artery disease    Diabetes mellitus without complication (HCC)    History of MI (myocardial infarction) 03/05/2019   Hypertension    Stroke (HCC)    Allergies  Allergen Reactions   Bee Venom     swelling    Social History   Socioeconomic History   Marital status: Single    Spouse name: Not on file   Number of children: Not on file   Years  of education: Not on file   Highest education level: Not on file  Occupational History   Not on file  Tobacco Use   Smoking status: Former    Types: Cigarettes    Quit date: 10/06/2017    Years since quitting: 4.2   Smokeless tobacco: Never  Substance and Sexual Activity   Alcohol use: Not Currently   Drug use: Yes    Types: Marijuana   Sexual activity: Not on file  Other Topics Concern   Not on file  Social History Narrative   Not on file   Social Determinants of Health   Financial Resource Strain: Low Risk  (03/10/2021)   Overall Financial Resource Strain (CARDIA)    Difficulty of Paying Living Expenses: Not very hard  Food Insecurity: Food Insecurity Present (12/07/2021)   Hunger Vital Sign    Worried About Running Out of Food in the Last Year: Sometimes true    Ran Out of Food in the Last Year: Never true  Transportation Needs: Unmet Transportation Needs (08/25/2021)   PRAPARE - Administrator, Civil Service (Medical): Yes    Lack of Transportation (Non-Medical): Yes  Physical Activity: Not on file  Stress: Not on file  Social Connections: Not on file  There were no vitals filed for this visit. There is no height or weight on file to calculate BMI.  Physical Exam  ASSESSMENT AND PLAN:   There are no diagnoses linked to this encounter.  No orders of the defined types were placed in this encounter.   No problem-specific Assessment & Plan notes found for this encounter.   No follow-ups on file.   [name]. [name], MD  [facility]. [place] office.

## 2021-12-22 ENCOUNTER — Inpatient Hospital Stay: Payer: Medicare Other | Admitting: Family Medicine

## 2021-12-24 ENCOUNTER — Other Ambulatory Visit (HOSPITAL_COMMUNITY): Payer: Self-pay | Admitting: Emergency Medicine

## 2021-12-24 ENCOUNTER — Other Ambulatory Visit: Payer: Self-pay

## 2021-12-24 ENCOUNTER — Telehealth (HOSPITAL_COMMUNITY): Payer: Self-pay

## 2021-12-24 ENCOUNTER — Emergency Department (HOSPITAL_COMMUNITY): Payer: Medicare Other

## 2021-12-24 ENCOUNTER — Encounter (HOSPITAL_COMMUNITY): Payer: Self-pay

## 2021-12-24 ENCOUNTER — Inpatient Hospital Stay (HOSPITAL_COMMUNITY)
Admission: EM | Admit: 2021-12-24 | Discharge: 2021-12-29 | DRG: 292 | Disposition: A | Discharge status: Home Health | Payer: Medicare Other | Attending: Internal Medicine | Admitting: Internal Medicine

## 2021-12-24 DIAGNOSIS — I43 Cardiomyopathy in diseases classified elsewhere: Secondary | ICD-10-CM | POA: Diagnosis not present

## 2021-12-24 DIAGNOSIS — Z79899 Other long term (current) drug therapy: Secondary | ICD-10-CM

## 2021-12-24 DIAGNOSIS — E1140 Type 2 diabetes mellitus with diabetic neuropathy, unspecified: Secondary | ICD-10-CM | POA: Diagnosis present

## 2021-12-24 DIAGNOSIS — I509 Heart failure, unspecified: Principal | ICD-10-CM

## 2021-12-24 DIAGNOSIS — I493 Ventricular premature depolarization: Secondary | ICD-10-CM | POA: Diagnosis present

## 2021-12-24 DIAGNOSIS — K5900 Constipation, unspecified: Secondary | ICD-10-CM | POA: Diagnosis not present

## 2021-12-24 DIAGNOSIS — Z7902 Long term (current) use of antithrombotics/antiplatelets: Secondary | ICD-10-CM

## 2021-12-24 DIAGNOSIS — N179 Acute kidney failure, unspecified: Secondary | ICD-10-CM | POA: Diagnosis not present

## 2021-12-24 DIAGNOSIS — E785 Hyperlipidemia, unspecified: Secondary | ICD-10-CM | POA: Diagnosis present

## 2021-12-24 DIAGNOSIS — Z5941 Food insecurity: Secondary | ICD-10-CM | POA: Diagnosis not present

## 2021-12-24 DIAGNOSIS — I252 Old myocardial infarction: Secondary | ICD-10-CM

## 2021-12-24 DIAGNOSIS — Z9861 Coronary angioplasty status: Secondary | ICD-10-CM

## 2021-12-24 DIAGNOSIS — E1122 Type 2 diabetes mellitus with diabetic chronic kidney disease: Secondary | ICD-10-CM | POA: Diagnosis not present

## 2021-12-24 DIAGNOSIS — I152 Hypertension secondary to endocrine disorders: Secondary | ICD-10-CM | POA: Diagnosis not present

## 2021-12-24 DIAGNOSIS — I110 Hypertensive heart disease with heart failure: Secondary | ICD-10-CM | POA: Diagnosis not present

## 2021-12-24 DIAGNOSIS — E854 Organ-limited amyloidosis: Secondary | ICD-10-CM | POA: Diagnosis not present

## 2021-12-24 DIAGNOSIS — J449 Chronic obstructive pulmonary disease, unspecified: Secondary | ICD-10-CM | POA: Diagnosis not present

## 2021-12-24 DIAGNOSIS — I2510 Atherosclerotic heart disease of native coronary artery without angina pectoris: Secondary | ICD-10-CM | POA: Diagnosis not present

## 2021-12-24 DIAGNOSIS — E1159 Type 2 diabetes mellitus with other circulatory complications: Secondary | ICD-10-CM | POA: Diagnosis present

## 2021-12-24 DIAGNOSIS — I5023 Acute on chronic systolic (congestive) heart failure: Principal | ICD-10-CM

## 2021-12-24 DIAGNOSIS — Z8673 Personal history of transient ischemic attack (TIA), and cerebral infarction without residual deficits: Secondary | ICD-10-CM | POA: Diagnosis not present

## 2021-12-24 DIAGNOSIS — R1111 Vomiting without nausea: Secondary | ICD-10-CM | POA: Diagnosis not present

## 2021-12-24 DIAGNOSIS — N1831 Chronic kidney disease, stage 3a: Secondary | ICD-10-CM | POA: Diagnosis not present

## 2021-12-24 DIAGNOSIS — Z743 Need for continuous supervision: Secondary | ICD-10-CM | POA: Diagnosis not present

## 2021-12-24 DIAGNOSIS — E1169 Type 2 diabetes mellitus with other specified complication: Secondary | ICD-10-CM | POA: Diagnosis not present

## 2021-12-24 DIAGNOSIS — Z91030 Bee allergy status: Secondary | ICD-10-CM | POA: Diagnosis not present

## 2021-12-24 DIAGNOSIS — R079 Chest pain, unspecified: Secondary | ICD-10-CM | POA: Diagnosis not present

## 2021-12-24 DIAGNOSIS — R609 Edema, unspecified: Secondary | ICD-10-CM | POA: Diagnosis not present

## 2021-12-24 DIAGNOSIS — Z87891 Personal history of nicotine dependence: Secondary | ICD-10-CM

## 2021-12-24 DIAGNOSIS — Z7982 Long term (current) use of aspirin: Secondary | ICD-10-CM

## 2021-12-24 DIAGNOSIS — R0602 Shortness of breath: Secondary | ICD-10-CM | POA: Diagnosis not present

## 2021-12-24 DIAGNOSIS — I10 Essential (primary) hypertension: Secondary | ICD-10-CM | POA: Diagnosis not present

## 2021-12-24 LAB — BASIC METABOLIC PANEL
Anion gap: 10 (ref 5–15)
BUN: 17 mg/dL (ref 6–20)
CO2: 21 mmol/L — ABNORMAL LOW (ref 22–32)
Calcium: 8.3 mg/dL — ABNORMAL LOW (ref 8.9–10.3)
Chloride: 110 mmol/L (ref 98–111)
Creatinine, Ser: 1.23 mg/dL (ref 0.61–1.24)
GFR, Estimated: >60 mL/min (ref >60)
Glucose, Bld: 142 mg/dL — ABNORMAL HIGH (ref 70–99)
Potassium: 3.8 mmol/L (ref 3.5–5.1)
Sodium: 141 mmol/L (ref 135–145)

## 2021-12-24 LAB — CBC WITH DIFFERENTIAL/PLATELET
Abs Immature Granulocytes: 0.01 10*3/uL (ref 0.00–0.07)
Basophils Absolute: 0.1 10*3/uL (ref 0.0–0.1)
Basophils Relative: 1 %
Eosinophils Absolute: 0.7 10*3/uL — ABNORMAL HIGH (ref 0.0–0.5)
Eosinophils Relative: 9 %
HCT: 37.5 % — ABNORMAL LOW (ref 39.0–52.0)
Hemoglobin: 11.9 g/dL — ABNORMAL LOW (ref 13.0–17.0)
Immature Granulocytes: 0 %
Lymphocytes Relative: 21 %
Lymphs Abs: 1.6 10*3/uL (ref 0.7–4.0)
MCH: 28 pg (ref 26.0–34.0)
MCHC: 31.7 g/dL (ref 30.0–36.0)
MCV: 88.2 fL (ref 80.0–100.0)
Monocytes Absolute: 0.8 10*3/uL (ref 0.1–1.0)
Monocytes Relative: 11 %
Neutro Abs: 4.2 10*3/uL (ref 1.7–7.7)
Neutrophils Relative %: 58 %
Platelets: 300 10*3/uL (ref 150–400)
RBC: 4.25 MIL/uL (ref 4.22–5.81)
RDW: 14.7 % (ref 11.5–15.5)
WBC: 7.4 10*3/uL (ref 4.0–10.5)
nRBC: 0 % (ref 0.0–0.2)

## 2021-12-24 LAB — TROPONIN I (HIGH SENSITIVITY)
Troponin I (High Sensitivity): 16 ng/L (ref <18)
Troponin I (High Sensitivity): 20 ng/L — ABNORMAL HIGH (ref <18)

## 2021-12-24 LAB — CBG MONITORING, ED: Glucose-Capillary: 113 mg/dL — ABNORMAL HIGH (ref 70–99)

## 2021-12-24 LAB — BRAIN NATRIURETIC PEPTIDE: B Natriuretic Peptide: 2778.6 pg/mL — ABNORMAL HIGH (ref 0.0–100.0)

## 2021-12-24 MED ORDER — ALBUTEROL SULFATE HFA 108 (90 BASE) MCG/ACT IN AERS: 2.0000 | INHALATION_SPRAY | RESPIRATORY_TRACT | Status: DC | PRN

## 2021-12-24 MED ORDER — CARVEDILOL 6.25 MG PO TABS
6.2500 mg | ORAL_TABLET | Freq: Two times a day (BID) | ORAL | Status: DC
  Administered 2021-12-24 – 2021-12-29 (×10): 6.25 mg via ORAL
  Filled 2021-12-24 (×2): qty 1
  Filled 2021-12-24 (×2): qty 2
  Filled 2021-12-24 (×6): qty 1

## 2021-12-24 MED ORDER — ASPIRIN 81 MG PO TBEC
81.0000 mg | DELAYED_RELEASE_TABLET | Freq: Every day | ORAL | Status: DC
  Administered 2021-12-25 – 2021-12-29 (×5): 81 mg via ORAL
  Filled 2021-12-24 (×5): qty 1

## 2021-12-24 MED ORDER — FUROSEMIDE 10 MG/ML IJ SOLN
40.0000 mg | Freq: Two times a day (BID) | INTRAMUSCULAR | Status: DC
  Administered 2021-12-25: 40 mg via INTRAVENOUS
  Filled 2021-12-24: qty 4

## 2021-12-24 MED ORDER — SODIUM CHLORIDE 0.9% FLUSH
3.0000 mL | Freq: Two times a day (BID) | INTRAVENOUS | Status: DC
  Administered 2021-12-24 – 2021-12-29 (×10): 3 mL via INTRAVENOUS

## 2021-12-24 MED ORDER — FUROSEMIDE 10 MG/ML IJ SOLN
40.0000 mg | Freq: Once | INTRAMUSCULAR | Status: AC
  Administered 2021-12-24: 40 mg via INTRAVENOUS
  Filled 2021-12-24: qty 4

## 2021-12-24 MED ORDER — ACETAMINOPHEN 325 MG PO TABS: 650.0000 mg | ORAL_TABLET | Freq: Four times a day (QID) | ORAL | Status: DC | PRN

## 2021-12-24 MED ORDER — ONDANSETRON HCL 4 MG PO TABS: 4.0000 mg | ORAL_TABLET | Freq: Four times a day (QID) | ORAL | Status: DC | PRN

## 2021-12-24 MED ORDER — CYCLOBENZAPRINE HCL 5 MG PO TABS
5.0000 mg | ORAL_TABLET | Freq: Three times a day (TID) | ORAL | Status: DC | PRN
  Administered 2021-12-25: 5 mg via ORAL
  Filled 2021-12-24: qty 1

## 2021-12-24 MED ORDER — OXYCODONE-ACETAMINOPHEN 5-325 MG PO TABS
1.0000 | ORAL_TABLET | Freq: Once | ORAL | Status: AC
  Administered 2021-12-24: 1 via ORAL
  Filled 2021-12-24: qty 1

## 2021-12-24 MED ORDER — ATORVASTATIN CALCIUM 80 MG PO TABS
80.0000 mg | ORAL_TABLET | Freq: Every day | ORAL | Status: DC
  Administered 2021-12-25 – 2021-12-29 (×5): 80 mg via ORAL
  Filled 2021-12-24 (×5): qty 1

## 2021-12-24 MED ORDER — INSULIN ASPART 100 UNIT/ML IJ SOLN
0.0000 [IU] | Freq: Every day | INTRAMUSCULAR | Status: DC
  Administered 2021-12-25 – 2021-12-26 (×2): 2 [IU] via SUBCUTANEOUS

## 2021-12-24 MED ORDER — SACUBITRIL-VALSARTAN 97-103 MG PO TABS
0.5000 | ORAL_TABLET | Freq: Two times a day (BID) | ORAL | Status: DC
  Administered 2021-12-24 – 2021-12-25 (×2): 0.5 via ORAL
  Filled 2021-12-24 (×5): qty 0.5

## 2021-12-24 MED ORDER — ENOXAPARIN SODIUM 40 MG/0.4ML IJ SOSY
40.0000 mg | PREFILLED_SYRINGE | INTRAMUSCULAR | Status: DC
  Administered 2021-12-24: 40 mg via SUBCUTANEOUS
  Filled 2021-12-24 (×3): qty 0.4

## 2021-12-24 MED ORDER — INSULIN ASPART 100 UNIT/ML IJ SOLN
0.0000 [IU] | Freq: Three times a day (TID) | INTRAMUSCULAR | Status: DC
  Administered 2021-12-25 – 2021-12-26 (×3): 1 [IU] via SUBCUTANEOUS
  Administered 2021-12-26: 3 [IU] via SUBCUTANEOUS
  Administered 2021-12-27: 2 [IU] via SUBCUTANEOUS
  Administered 2021-12-27: 1 [IU] via SUBCUTANEOUS
  Administered 2021-12-28: 2 [IU] via SUBCUTANEOUS
  Administered 2021-12-28 – 2021-12-29 (×3): 1 [IU] via SUBCUTANEOUS

## 2021-12-24 MED ORDER — NITROGLYCERIN 0.4 MG SL SUBL
0.4000 mg | SUBLINGUAL_TABLET | SUBLINGUAL | Status: DC | PRN
  Filled 2021-12-24: qty 1

## 2021-12-24 MED ORDER — ISOSORBIDE MONONITRATE ER 30 MG PO TB24
30.0000 mg | ORAL_TABLET | Freq: Every day | ORAL | Status: DC
  Administered 2021-12-25 – 2021-12-29 (×5): 30 mg via ORAL
  Filled 2021-12-24 (×5): qty 1

## 2021-12-24 MED ORDER — ACETAMINOPHEN 650 MG RE SUPP: 650.0000 mg | Freq: Four times a day (QID) | RECTAL | Status: DC | PRN

## 2021-12-24 MED ORDER — CLOPIDOGREL BISULFATE 75 MG PO TABS
75.0000 mg | ORAL_TABLET | Freq: Every day | ORAL | Status: DC
  Administered 2021-12-25 – 2021-12-29 (×5): 75 mg via ORAL
  Filled 2021-12-24 (×5): qty 1

## 2021-12-24 MED ORDER — ONDANSETRON HCL 4 MG/2ML IJ SOLN: 4.0000 mg | Freq: Four times a day (QID) | INTRAMUSCULAR | Status: DC | PRN

## 2021-12-24 MED ORDER — TAFAMIDIS 61 MG PO CAPS: 61.0000 mg | ORAL_CAPSULE | Freq: Every day | ORAL | Status: DC

## 2021-12-24 MED ORDER — LIDOCAINE 5 % EX PTCH
1.0000 | MEDICATED_PATCH | CUTANEOUS | Status: DC
  Administered 2021-12-24 – 2021-12-28 (×5): 1 via TRANSDERMAL
  Filled 2021-12-24 (×5): qty 1

## 2021-12-24 MED ORDER — SENNOSIDES-DOCUSATE SODIUM 8.6-50 MG PO TABS: 1.0000 | ORAL_TABLET | Freq: Every evening | ORAL | Status: DC | PRN

## 2021-12-24 MED ORDER — HYDRALAZINE HCL 50 MG PO TABS
50.0000 mg | ORAL_TABLET | Freq: Three times a day (TID) | ORAL | Status: DC
  Administered 2021-12-24 – 2021-12-29 (×14): 50 mg via ORAL
  Filled 2021-12-24 (×6): qty 1
  Filled 2021-12-24: qty 2
  Filled 2021-12-24 (×7): qty 1

## 2021-12-24 MED ORDER — POTASSIUM CHLORIDE CRYS ER 20 MEQ PO TBCR
20.0000 meq | EXTENDED_RELEASE_TABLET | Freq: Every day | ORAL | Status: DC
  Administered 2021-12-24 – 2021-12-27 (×4): 20 meq via ORAL
  Filled 2021-12-24 (×4): qty 1

## 2021-12-24 NOTE — Hospital Course (Signed)
Thomas West is a 61 y.o. male with medical history significant for HFrEF (EF 30-35% by TTE 11/02/2021), cardiac amyloidosis, CAD s/p PCI to RPAV with unknown stent type, CKD stage IIIa, history of CVA, T2DM, HTN, HLD who is admitted with acute on chronic HFrEF.

## 2021-12-24 NOTE — Telephone Encounter (Signed)
Thomas West, paramedicine called to report that the patients weight is up 7 pounds. He also complains of chest pain and tightness and dizziness. She did hook him up to the monitor and it showed he had occasional pvcs. Vitals are listed below. Please advise.    BP 190/100 HR 96

## 2021-12-24 NOTE — Telephone Encounter (Signed)
DeeDee called back to report that she called ems for patient because he started vomiting.

## 2021-12-24 NOTE — Assessment & Plan Note (Signed)
Renal function improved from recent baseline with creatinine 1.23 on admission.  Monitor with diuresis.

## 2021-12-24 NOTE — Assessment & Plan Note (Signed)
Not currently on medical therapy as an outpatient.  Placed on SSI in hospital.

## 2021-12-24 NOTE — Assessment & Plan Note (Signed)
Hx of PCI to RPAV with unknown stent type.  Reports some nonspecific chest tightness.  Troponin minimally elevated 16 > 20.  No acute ischemic changes on EKG. -Continue Coreg and Imdur -Continue aspirin, Plavix, atorvastatin -Keep on telemetry

## 2021-12-24 NOTE — Progress Notes (Signed)
Paramedicine Encounter    Patient ID: Thomas West, male    DOB: 1961-04-04, 61 y.o.   MRN: [id number]   There were no vitals taken for this visit. Weight yesterday-not taken Last visit weight-149.6lb  ATF Mr. Felkins A&O x 4, skin W&D w/ good color.  He complains today of tightness in his chest and indicates by pointing to his chest and includes his abdomen.  He states he's been out today running errands and paying bills.  His only meal today was a sausage egg and cheese mcmuffin.  I discussed this was not a good nutritional choice w/ high sodium content.  He reports this onset has just started today.  His weight is up 7lbs.  He has edema in his lower extremities and + JVD.  Lung sounds diminished in the bases, clear in upper lobes bilat.  Pt was hypertensive with manual pressure of 190/100.  I reached out to HF Triage and spoke to [facility] who would be making contact with Anna Genre.  While waiting on response pt's presentation worsened and he began to vomit.  I requested ambulance for transport.  Followed chest pain protocol with pt. Receiving ASA, 18ga. IV saline lock lft AC, 4mg . Zofran administered and 1 Ntg. SL w/o change in pain.  Pt had recently come home from the hospital and part of med change was to hold restart of Lasix until his weight reached 152lb.  I did inquire about this and he states that he only started his Lasix back when his weight reached  the goal.  He could not tell me what day that happened.  I reached back out to HF  Triage and could not get an answer.  Then reached out to and asked she relay that Mr. Helwig was being transported to [facility] by EMS.  Patient Care Team: [employer], [name], MD as PCP - General (Family Medicine) [name], MD as PCP - Cardiology (Cardiology)  Patient Active Problem List   Diagnosis Date Noted   AKI (acute kidney injury) (HCC) 12/13/2021   Diarrhea 12/13/2021   Shortness of breath 12/13/2021   Chest pain 12/13/2021   Nausea and  vomiting 12/13/2021   Heart failure (HCC) 12/08/2021   Hypertension associated with diabetes (HCC) 07/12/2021   Chronic kidney disease, stage 3a (HCC) 07/12/2021   Hyperkalemia 07/12/2021   CHF exacerbation (HCC) 04/20/2021   Acute on chronic combined systolic and diastolic CHF (congestive heart failure) (HCC) 03/09/2021   Acute on chronic HFrEF (heart failure with reduced ejection fraction) (HCC) 03/08/2021   HFrEF (heart failure with reduced ejection fraction) (HCC) 02/03/2021   Diabetes mellitus (HCC) 03/27/2019   Hyperlipidemia associated with type 2 diabetes mellitus (HCC) 03/27/2019   CAD (coronary artery disease) 03/05/2019   History of MI (myocardial infarction) 03/05/2019   Type 2 diabetes mellitus with diabetic neuropathy, unspecified (HCC) 03/05/2019   CKD (chronic kidney disease), stage II 03/05/2019   HLD (hyperlipidemia) 03/05/2019   GERD (gastroesophageal reflux disease) 03/05/2019   Hypertension, essential, benign 03/05/2019    Current Outpatient Medications:    aspirin EC 81 MG tablet, Take 1 tablet (81 mg total) by mouth daily. Swallow whole., Disp: 30 tablet, Rfl: 12   atorvastatin (LIPITOR) 80 MG tablet, Take 1 tablet (80 mg total) by mouth daily., Disp: 30 tablet, Rfl: 6   carvedilol (COREG) 6.25 MG tablet, Take 1 tablet (6.25 mg total) by mouth EVERY 12 HOURS (10AM &10PM), Disp: 60 tablet, Rfl: 6   clopidogrel (PLAVIX) 75  MG tablet, Take 75 mg by mouth daily., Disp: , Rfl:    cyanocobalamin 1000 MCG tablet, Take 1 tablet (1,000 mcg total) by mouth daily., Disp: 30 tablet, Rfl: 0   furosemide (LASIX) 40 MG tablet, Take 1 tablet (40 mg total) by mouth daily. Resume once weight comes up to 152lbs., Disp: , Rfl:    hydrALAZINE (APRESOLINE) 50 MG tablet, Take 1 tablet (50 mg total) by mouth 3 (three) times daily., Disp: 90 tablet, Rfl: 6   hydrocortisone cream 1 %, Apply to affected area 2 times daily (Patient taking differently: Apply 1 Application topically 2 (two)  times daily as needed for itching.), Disp: 28 g, Rfl: 0   isosorbide mononitrate (IMDUR) 30 MG 24 hr tablet, Take 1 tablet (30 mg total) by mouth daily., Disp: 30 tablet, Rfl: 6   sacubitril-valsartan (ENTRESTO) 97-103 MG, Take 0.5 tablets by mouth 2 (two) times daily., Disp: , Rfl:    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, Take 1 capsule by mouth daily., Disp: 30 capsule, Rfl: 11 Allergies  Allergen Reactions   Bee Venom     swelling      Social History   Socioeconomic History   Marital status: Single    Spouse name: Not on file   Number of children: Not on file   Years of education: Not on file   Highest education level: Not on file  Occupational History   Not on file  Tobacco Use   Smoking status: Former    Types: Cigarettes    Quit date: 10/06/2017    Years since quitting: 4.2   Smokeless tobacco: Never  Substance and Sexual Activity   Alcohol use: Not Currently   Drug use: Yes    Types: Marijuana   Sexual activity: Not on file  Other Topics Concern   Not on file  Social History Narrative   Not on file   Social Determinants of Health   Financial Resource Strain: Low Risk  (03/10/2021)   Overall Financial Resource Strain (CARDIA)    Difficulty of Paying Living Expenses: Not very hard  Food Insecurity: Food Insecurity Present (12/07/2021)   Hunger Vital Sign    Worried About Running Out of Food in the Last Year: Sometimes true    Ran Out of Food in the Last Year: Never true  Transportation Needs: Unmet Transportation Needs (08/25/2021)   PRAPARE - Administrator, Civil Service (Medical): Yes    Lack of Transportation (Non-Medical): Yes  Physical Activity: Not on file  Stress: Not on file  Social Connections: Not on file  Intimate Partner Violence: Not on file    Physical Exam      Future Appointments  Date Time Provider Department Center  12/28/2021  3:00 PM Suanne Marker, MD [facility] None  01/04/2022  9:30 AM [facility] PA/NP [facility] None       Beatrix Shipper, EMT-Paramedic [phone] [facility] Paramedic  12/24/21

## 2021-12-24 NOTE — Assessment & Plan Note (Signed)
BP elevated on admission.  Resume home Coreg, hydralazine, Imdur, Entresto.  IV Lasix as above.

## 2021-12-24 NOTE — Assessment & Plan Note (Signed)
Cardiac amyloidosis Presenting with progressive weight gain, DOE, BNP >2700.  Recent adjustment of GDMT due to overdiuresis with AKI.  Restarted Lasix 40 mg daily at home without improvement. -Continue IV Lasix 40 mg BID -Continue Coreg 6.25 mg BID -Continue Entresto 49/51 mg BID -Continue hydralazine 50 mg TID and Imdur 30 mg daily -Spironolactone DC'd on last admit due to AKI -Continue Tafamadis 61 mg daily -Strict I/O's and daily weights

## 2021-12-24 NOTE — ED Notes (Signed)
Patient refused nitroglycerin for chest pain. Is reporting having back and neck pain and requesting stronger meds.  Rates pain 10/10   ED provider made aware.

## 2021-12-24 NOTE — H&P (Signed)
History and Physical    Thomas West VUY:[id number] DOB: 11-08-1960 DOA: 12/24/2021  PCP: [name], [name], MD  Patient coming from: Home  I have personally briefly reviewed patient's old medical records in [facility]  Chief Complaint: Weight gain, chest tightness  HPI: Thomas West is a 61 y.o. male with medical history significant for HFrEF (EF 30-35% by TTE 11/02/2021), cardiac amyloidosis, CAD s/p PCI to RPAV with unknown stent type, CKD stage IIIa, history of CVA, T2DM, HTN, HLD who presented to the ED for evaluation of weight gain.  Patient recently admitted 12/12/2021-12/15/2021 for acute kidney injury related to volume depletion from overdiuresis plus medication effect from GDMT for management of HFrEF.  Renal function improved with IV fluid hydration.  Spironolactone was discontinued and Entresto dose was halved.  He was instructed to hold Lasix until weight increased to 152 pounds.  Patient states that 2 days after he was discharged he did restart Lasix 40 mg daily since his weight started increasing.  Since then he has had sensation of fluid in his lungs with exertional dyspnea, cough occasionally productive of white sputum, and lower extremity swelling.  He says he has had a 7 pound weight gain over the last 2 days.  He reports midsternal chest tightness.  Patient also reports continued lower back pain described as muscle cramping sensation.  He says this has been ongoing since he fell down stairs prior to his last admission.  CT L-spine on 12/08/2021 was negative for acute fracture or subluxation of the L-spine.  ED Course  Labs/Imaging on admission: I have personally reviewed following labs and imaging studies.  Initial vitals showed BP 189/125, pulse 90, RR 16, temp 98.1 F, SPO2 96% on room air.  Labs show sodium 141, potassium 3.8, bicarb 21, BUN 17, creatinine 1.23, serum glucose 142, BNP 2778.6, troponin 16, WBC 7.4, hemoglobin 11.9, platelets 300,000.  2 view chest  x-ray negative for focal consolidation, edema, effusion.  Peribronchial thickening noted.  Patient was given IV Lasix 40 mg.  The hospitalist service was consulted to admit for further evaluation and management.  Review of Systems: All systems reviewed and are negative except as documented in history of present illness above.   Past Medical History:  Diagnosis Date   CHF (congestive heart failure) (HCC)    Coronary artery disease    Diabetes mellitus without complication (HCC)    History of MI (myocardial infarction) 03/05/2019   Hypertension    Stroke [facility])     Past Surgical History:  Procedure Laterality Date   leg surgery     "pins in left shin"   RIGHT/LEFT HEART CATH AND CORONARY ANGIOGRAPHY N/A 03/11/2021   Procedure: RIGHT/LEFT HEART CATH AND CORONARY ANGIOGRAPHY;  Surgeon: Orbie Pyo, MD;  Location: [facility] INV[facility]IVE CV LAB;  Service: Cardiovascular;  Laterality: N/A;    Social History:  reports that he quit smoking about 4 years ago. His smoking use included cigarettes. He has never used smokeless tobacco. He reports that he does not currently use alcohol. He reports current drug use. Drug: Marijuana.  Allergies  Allergen Reactions   Bee Venom     swelling    History reviewed. No pertinent family history.   Prior to Admission medications   Medication Sig Start Date End Date Taking? Authorizing Provider  aspirin EC 81 MG tablet Take 1 tablet (81 mg total) by mouth daily. Swallow whole. 12/11/21   Alen Bleacher, NP  atorvastatin (LIPITOR) 80 MG tablet Take 1 tablet (  80 mg total) by mouth daily. 12/10/21 07/08/22  Earnie Larsson, NP  carvedilol (COREG) 6.25 MG tablet Take 1 tablet (6.25 mg total) by mouth EVERY 12 HOURS (10AM &10PM) 12/10/21 07/08/22  Earnie Larsson, NP  clopidogrel (PLAVIX) 75 MG tablet Take 75 mg by mouth daily.    [provider]  cyanocobalamin 1000 MCG tablet Take 1 tablet (1,000 mcg total) by mouth daily. 12/16/21   Patrecia Pour, MD  furosemide  (LASIX) 40 MG tablet Take 1 tablet (40 mg total) by mouth daily. Resume once weight comes up to 152lbs. 12/15/21   Patrecia Pour, MD  hydrALAZINE (APRESOLINE) 50 MG tablet Take 1 tablet (50 mg total) by mouth 3 (three) times daily. 12/10/21 07/08/22  Earnie Larsson, NP  hydrocortisone cream 1 % Apply to affected area 2 times daily Patient taking differently: Apply 1 Application topically 2 (two) times daily as needed for itching. 12/10/21   Earnie Larsson, NP  isosorbide mononitrate (IMDUR) 30 MG 24 hr tablet Take 1 tablet (30 mg total) by mouth daily. 12/10/21   Earnie Larsson, NP  sacubitril-valsartan (ENTRESTO) 97-103 MG Take 0.5 tablets by mouth 2 (two) times daily. 12/15/21   Patrecia Pour, MD  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 Take 1 capsule by mouth daily. 09/02/21   Bensimhon, Shaune Pascal, MD    Physical Exam: Vitals:   12/24/21 1722 12/24/21 1726 12/24/21 1728 12/24/21 2030  BP:    (!) 169/111  Pulse:    91  Resp:    18  Temp:  98.1 F (36.7 C)    TempSrc:  Oral    SpO2:   96% 98%  Weight: 70.8 kg     Height: 5\' 9"  (1.753 m)      Constitutional: Resting in bed in the right lateral decubitus position.  NAD, calm. Eyes: EOMI, lids and conjunctivae normal ENMT: Mucous membranes are moist. Posterior pharynx clear of any exudate or lesions.Normal dentition.  Neck: normal, supple, no masses. Respiratory: clear to auscultation bilaterally, no wheezing, no crackles. Normal respiratory effort. No accessory muscle use.  Cardiovascular: Regular rate and rhythm, no murmurs / rubs / gallops.  Trace lower extremity edema. 2+ pedal pulses. Abdomen: no tenderness, no masses palpated. Musculoskeletal: Paraspinal tenderness lower lumbar region.  No clubbing / cyanosis. No joint deformity upper and lower extremities. Good ROM, no contractures. Normal muscle tone.  Skin: no rashes, lesions, ulcers. No induration Neurologic: Sensation intact. Strength 5/5 in all 4.  Psychiatric: Alert and oriented x 3.  EKG: Personally  reviewed. Sinus rhythm, rate 99, QTc 426, no acute ischemic changes.  Compared to prior inferolateral T wave inversions are no longer present.  Assessment/Plan Principal Problem:   Acute on chronic HFrEF (heart failure with reduced ejection fraction) (HCC) Active Problems:   Hypertension associated with diabetes ([place])   Chronic kidney disease, stage 3a (HCC)   Type 2 diabetes mellitus with diabetic neuropathy, unspecified (HCC)   CAD (coronary artery disease)   Hyperlipidemia associated with type 2 diabetes mellitus ([place])   History of CVA (cerebrovascular accident) without residual deficits   Thomas West is a 61 y.o. male with medical history significant for HFrEF (EF 30-35% by TTE 11/02/2021), cardiac amyloidosis, CAD s/p PCI to RPAV with unknown stent type, CKD stage IIIa, history of CVA, T2DM, HTN, HLD who is admitted with acute on chronic HFrEF.  Assessment and Plan: * Acute on chronic HFrEF (heart failure with reduced ejection fraction) (HCC) Cardiac amyloidosis Presenting with  progressive weight gain, DOE, BNP >2700.  Recent adjustment of GDMT due to overdiuresis with AKI.  Restarted Lasix 40 mg daily at home without improvement. -Continue IV Lasix 40 mg BID -Continue Coreg 6.25 mg BID -Continue Entresto 49/51 mg BID -Continue hydralazine 50 mg TID and Imdur 30 mg daily -Spironolactone DC'd on last admit due to AKI -Continue Tafamadis 61 mg daily -Strict I/O's and daily weights  Hypertension associated with diabetes (HCC) BP elevated on admission.  Resume home Coreg, hydralazine, Imdur, Entresto.  IV Lasix as above.  Chronic kidney disease, stage 3a (HCC) Renal function improved from recent baseline with creatinine 1.23 on admission.  Monitor with diuresis.  Type 2 diabetes mellitus with diabetic neuropathy, unspecified (HCC) Not currently on medical therapy as an outpatient.  Placed on SSI in hospital.  History of CVA (cerebrovascular accident) without residual  deficits Continue aspirin, Plavix, atorvastatin.  Hyperlipidemia associated with type 2 diabetes mellitus (HCC) Continue atorvastatin.  CAD (coronary artery disease) Hx of PCI to RPAV with unknown stent type.  Reports some nonspecific chest tightness.  Troponin minimally elevated 16 > 20.  No acute ischemic changes on EKG. -Continue Coreg and Imdur -Continue aspirin, Plavix, atorvastatin -Keep on telemetry  DVT prophylaxis: enoxaparin (LOVENOX) injection 40 mg Start: 12/24/21 2200 Code Status: Full code, confirmed with patient on admission Family Communication: Discussed with patient, he has discussed with family Disposition Plan: From home and likely discharge to home pending clinical progress Consults called: None Severity of Illness: The appropriate patient status for this patient is INPATIENT. Inpatient status is judged to be reasonable and necessary in order to provide the required intensity of service to ensure the patient's safety. The patient's presenting symptoms, physical exam findings, and initial radiographic and laboratory data in the context of their chronic comorbidities is felt to place them at high risk for further clinical deterioration. Furthermore, it is not anticipated that the patient will be medically stable for discharge from the hospital within 2 midnights of admission.   * I certify that at the point of admission it is my clinical judgment that the patient will require inpatient hospital care spanning beyond 2 midnights from the point of admission due to high intensity of service, high risk for further deterioration and high frequency of surveillance required.Darreld Mclean MD [facility]  If 7PM-7AM, please contact night-coverage [email or web address]  12/24/2021, 9:46 PM

## 2021-12-24 NOTE — ED Notes (Signed)
Patient was offered a sandwich, patient refused. Offered a snack also refused.   RN let patient know if he changed his mind to ring the call button. Has no other needs or concerns at the moment.  Per patient "I hope I go upstairs soon they'll have better options."   NAD.

## 2021-12-24 NOTE — ED Triage Notes (Signed)
Patient arrives via EMS from home due to chest pain and leg swelling since yesterday. Patient has history of CHF and states he was taken off of his lasix. Patient received 324 of aspirin and 2 nitroglycerin prior to arrival to department. EMS also administered 4 mg of zofran IV due to patient complaining of nausea. BP 190/100 95% on room air.

## 2021-12-24 NOTE — Assessment & Plan Note (Signed)
Continue atorvastatin

## 2021-12-24 NOTE — ED Provider Notes (Signed)
[facility] EMERGENCY DEPARTMENT Provider Note   CSN: [id number] Arrival date & time: 12/24/21  1708     History  Chief Complaint  Patient presents with   Chest Pain   Leg Swelling    Thomas West is a 61 y.o. male.  The history is provided by the patient and medical records. No language interpreter was used.  Chest Pain   61 year old male significant history of CAD and prior MI, diabetes, CKD, COPD, CHF, GERD brought here via EMS from home for complaints of chest pain and leg swelling.  Patient report for the past 2 days he has noticed progressive worsening chest tightness and shortness of breath along with leg swelling.  Patient felt symptoms worse when he sits up and mildly improved when he lays down.  States he gained approximately 7 pounds in the past 2 days and he is unsure why as he has not changed his diet or change in medication.  Chest tightness is moderate in severity.  He also endorsed cough and some posttussive emesis.  Endorses some chills without fever.  Denies any headache, abdominal pain or urinary symptoms.  Patient states he drinks only on occasion and does not smoke.  Patient felt his symptoms related to his CHF.  He reported having a home health nurse that he sees twice weekly and today home health nurse did provide some treatment to help with his symptoms without relief and she recommended for him to come to the hospital.  Home Medications Prior to Admission medications   Medication Sig Start Date End Date Taking? Authorizing Provider  aspirin EC 81 MG tablet Take 1 tablet (81 mg total) by mouth daily. Swallow whole. 12/11/21   Alen Bleacher, NP  atorvastatin (LIPITOR) 80 MG tablet Take 1 tablet (80 mg total) by mouth daily. 12/10/21 07/08/22  Alen Bleacher, NP  carvedilol (COREG) 6.25 MG tablet Take 1 tablet (6.25 mg total) by mouth EVERY 12 HOURS (10AM &10PM) 12/10/21 07/08/22  Alen Bleacher, NP  clopidogrel (PLAVIX) 75 MG tablet Take 75 mg by mouth  daily.    [provider]  cyanocobalamin 1000 MCG tablet Take 1 tablet (1,000 mcg total) by mouth daily. 12/16/21   Tyrone Nine, MD  furosemide (LASIX) 40 MG tablet Take 1 tablet (40 mg total) by mouth daily. Resume once weight comes up to 152lbs. 12/15/21   Tyrone Nine, MD  hydrALAZINE (APRESOLINE) 50 MG tablet Take 1 tablet (50 mg total) by mouth 3 (three) times daily. 12/10/21 07/08/22  Alen Bleacher, NP  hydrocortisone cream 1 % Apply to affected area 2 times daily Patient taking differently: Apply 1 Application topically 2 (two) times daily as needed for itching. 12/10/21   Alen Bleacher, NP  isosorbide mononitrate (IMDUR) 30 MG 24 hr tablet Take 1 tablet (30 mg total) by mouth daily. 12/10/21   Alen Bleacher, NP  sacubitril-valsartan (ENTRESTO) 97-103 MG Take 0.5 tablets by mouth 2 (two) times daily. 12/15/21   Tyrone Nine, MD  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 Take 1 capsule by mouth daily. 09/02/21   Bensimhon, Bevelyn Buckles, MD      Allergies    Bee venom    Review of Systems   Review of Systems  Cardiovascular:  Positive for chest pain.  All other systems reviewed and are negative.   Physical Exam Updated Vital Signs BP (!) 189/125 (BP Location: Right Arm)   Pulse 91   Temp 98.1 F (36.7 C) (  Oral)   Resp 16   Ht 5\' 9"  (1.753 m)   Wt 70.8 kg   SpO2 96%   BMI 23.04 kg/m  Physical Exam Vitals and nursing note reviewed.  Constitutional:      General: He is not in acute distress.    Appearance: He is well-developed.  HENT:     Head: Atraumatic.  Eyes:     Conjunctiva/sclera: Conjunctivae normal.  Neck:     Comments: No JVD Cardiovascular:     Rate and Rhythm: Normal rate and regular rhythm.     Heart sounds: No murmur heard.    No friction rub. No gallop.  Pulmonary:     Effort: Pulmonary effort is normal.     Breath sounds: Normal breath sounds. No wheezing, rhonchi or rales.  Abdominal:     Palpations: Abdomen is soft.     Tenderness: There is no abdominal tenderness.   Musculoskeletal:     Cervical back: Neck supple.     Right lower leg: Edema present.     Left lower leg: Edema present.     Comments: Trace edema noted to bilateral lower extremities with intact pedal pulses.  Skin:    Findings: No rash.  Neurological:     Mental Status: He is alert and oriented to person, place, and time.  Psychiatric:        Mood and Affect: Mood normal.     ED Results / Procedures / Treatments   Labs (all labs ordered are listed, but only abnormal results are displayed) Labs Reviewed  BASIC METABOLIC PANEL - Abnormal; Notable for the following components:      Result Value   CO2 21 (*)    Glucose, Bld 142 (*)    Calcium 8.3 (*)    All other components within normal limits  CBC WITH DIFFERENTIAL/PLATELET - Abnormal; Notable for the following components:   Hemoglobin 11.9 (*)    HCT 37.5 (*)    Eosinophils Absolute 0.7 (*)    All other components within normal limits  BRAIN NATRIURETIC PEPTIDE - Abnormal; Notable for the following components:   B Natriuretic Peptide 2,778.6 (*)    All other components within normal limits  TROPONIN I (HIGH SENSITIVITY) - Abnormal; Notable for the following components:   Troponin I (High Sensitivity) 20 (*)    All other components within normal limits  TROPONIN I (HIGH SENSITIVITY)    EKG EKG Interpretation  Date/Time:  Thursday December 24 2021 17:47:37 EDT Ventricular Rate:  92 PR Interval:  193 QRS Duration: 86 QT Interval:  344 QTC Calculation: 426 R Axis:   88 Text Interpretation: Sinus rhythm Probable left atrial enlargement Nonspecific T abnormalities, lateral leads No significant change since last tracing Confirmed by [birth year]1982 [phone]) on 12/24/2021 5:50:04 PM  Radiology DG Chest 2 View  Result Date: 12/24/2021 CLINICAL DATA:  Shortness of breath, chest pain EXAM: CHEST - 2 VIEW COMPARISON:  12/12/2021 FINDINGS: Transverse diameter of heart is slightly increased. There are no signs of pulmonary edema or  focal pulmonary consolidation. There is no pleural effusion or pneumothorax. There is peribronchial thickening. IMPRESSION: There are no signs of pulmonary edema or focal pulmonary consolidation. Peribronchial thickening suggests bronchitis. Electronically Signed   By: 12/14/2021 M.D.   On: 12/24/2021 19:04    Procedures Procedures    Medications Ordered in ED Medications  nitroGLYCERIN (NITROSTAT) SL tablet 0.4 mg (has no administration in time range)  albuterol (VENTOLIN HFA) 108 (90 Base) MCG/ACT inhaler  2 puff (has no administration in time range)  lidocaine (LIDODERM) 5 % 1 patch (1 patch Transdermal Patch Applied 12/24/21 2046)  furosemide (LASIX) injection 40 mg (40 mg Intravenous Given 12/24/21 2030)  oxyCODONE-acetaminophen (PERCOCET/ROXICET) 5-325 MG per tablet 1 tablet (1 tablet Oral Given 12/24/21 2046)    ED Course/ Medical Decision Making/ A&P                           Medical Decision Making Amount and/or Complexity of Data Reviewed Labs: ordered. Radiology: ordered.  Risk Prescription drug management. Decision regarding hospitalization.   BP (!) 189/125 (BP Location: Right Arm)   Pulse 91   Temp 98.1 F (36.7 C) (Oral)   Resp 16   Ht 5\' 9"  (1.753 m)   Wt 70.8 kg   SpO2 96%   BMI 23.04 kg/m   5:28 PM This is a 61 year old male significant history of HFrEF with an EF of 30-35% on last echo on 11/02/2021, history of prior MI, CKD, who brought here via EMS from home for complaints of chest pressure, shortness of breath, and leg swelling.  Symptoms ongoing for the past 2 days the patient felt symptoms similar to prior CHF exacerbation.  He is unsure what exacerbated his symptoms and has had no change in his diet or medication.  He noted a 7 pound weight gain within the past 2 days.  On exam, patient is well-appearing appears to be in no acute discomfort.  He is not in respiratory distress.  He does not appear to be fluid overload as there are no JVD and his  lungs are clear on auscultation.  He does have trace edema to his bilateral lower extremities.  Work-up initiated, sublingual nitro given  8:44 PM Patient declined nitroglycerin but states he is having pain to his lower back from a prior fall that he has been evaluated already.  States he has been receiving Percocet and Lidoderm patch for his back pain and request for same.  I will provide symptomatic treatment.  Labs EKG and imaging obtained independently viewed interpreted by me and I agree with radiologist interpretation.  Patient has a markedly elevated BNP of 2778.  Normal troponin.  Improved renal function with a creatinine of 1.23.  Chest x-ray shows no signs of pulmonary edema or focal pulmonary consolidation however peribronchial thickening suggest bronchitis.  Patient was given an albuterol inhaler to use.  Since his BNP is markedly elevated along with patient being symptomatic, patient given 40 mg of Lasix and I will consult medicine for admission.  8:59 PM Appreciate consultation from [facility] hospitalist, Dr. [id number], who agrees to see and evaluate patient and determine disposition.  This patient presents to the ED for concern of sob, this involves an extensive number of treatment options, and is a complaint that carries with it a high risk of complications and morbidity.  The differential diagnosis includes CHF exacerbation, asthma, copd, pna, ptx, pe, acs, bronchitis, bronchiectasis  Co morbidities that complicate the patient evaluation chf  ckd Additional history obtained:  Additional history obtained from EMS External records from outside source obtained and reviewed including EMR including prior labs and imaging  Lab Tests:  I Ordered, and personally interpreted labs.  The pertinent results include:  as above  Imaging Studies ordered:  I ordered imaging studies including CXR I independently visualized and interpreted imaging which showed bronchitis I agree with the  radiologist interpretation  Cardiac Monitoring:  The patient was  maintained on a cardiac monitor.  I personally viewed and interpreted the cardiac monitored which showed an underlying rhythm of: NSR  Medicines ordered and prescription drug management:  I ordered medication including lasix  for CHF exacerbation Reevaluation of the patient after these medicines showed that the patient improved I have reviewed the patients home medicines and have made adjustments as needed  Test Considered: as above  Critical Interventions: IV lasix  Consultations Obtained:  I requested consultation with the [facility] Hospistalist Dr. Allena Katz,  and discussed lab and imaging findings as well as pertinent plan - they recommend: admission  Problem List / ED Course: CHF exacerbation  Reevaluation:  After the interventions noted above, I reevaluated the patient and found that they have :improved  Social Determinants of Health: food insecurity  Transportation, lack of  Dispostion:  After consideration of the diagnostic results and the patients response to treatment, I feel that the patent would benefit from admission.         Final Clinical Impression(s) / ED Diagnoses Final diagnoses:  Acute on chronic congestive heart failure, unspecified heart failure type [facility])    Rx / DC Orders ED Discharge Orders     None         Fayrene Helper, PA-C 12/24/21 2132    Charlynne Pander, MD 12/24/21 [phone]

## 2021-12-24 NOTE — ED Notes (Signed)
ED Provider at bedside. 

## 2021-12-24 NOTE — Assessment & Plan Note (Signed)
Continue aspirin, Plavix, atorvastatin. ?

## 2021-12-25 ENCOUNTER — Other Ambulatory Visit (HOSPITAL_COMMUNITY): Payer: Self-pay

## 2021-12-25 DIAGNOSIS — I5023 Acute on chronic systolic (congestive) heart failure: Secondary | ICD-10-CM | POA: Diagnosis not present

## 2021-12-25 LAB — CBG MONITORING, ED
Glucose-Capillary: 139 mg/dL — ABNORMAL HIGH (ref 70–99)
Glucose-Capillary: 145 mg/dL — ABNORMAL HIGH (ref 70–99)

## 2021-12-25 LAB — CBC
HCT: 36.2 % — ABNORMAL LOW (ref 39.0–52.0)
Hemoglobin: 12 g/dL — ABNORMAL LOW (ref 13.0–17.0)
MCH: 28.3 pg (ref 26.0–34.0)
MCHC: 33.1 g/dL (ref 30.0–36.0)
MCV: 85.4 fL (ref 80.0–100.0)
Platelets: 305 10*3/uL (ref 150–400)
RBC: 4.24 MIL/uL (ref 4.22–5.81)
RDW: 14.6 % (ref 11.5–15.5)
WBC: 6.5 10*3/uL (ref 4.0–10.5)
nRBC: 0 % (ref 0.0–0.2)

## 2021-12-25 LAB — BASIC METABOLIC PANEL
Anion gap: 9 (ref 5–15)
BUN: 15 mg/dL (ref 6–20)
CO2: 24 mmol/L (ref 22–32)
Calcium: 8.8 mg/dL — ABNORMAL LOW (ref 8.9–10.3)
Chloride: 110 mmol/L (ref 98–111)
Creatinine, Ser: 1.23 mg/dL (ref 0.61–1.24)
GFR, Estimated: >60 mL/min (ref >60)
Glucose, Bld: 134 mg/dL — ABNORMAL HIGH (ref 70–99)
Potassium: 4 mmol/L (ref 3.5–5.1)
Sodium: 143 mmol/L (ref 135–145)

## 2021-12-25 LAB — GLUCOSE, CAPILLARY
Glucose-Capillary: 136 mg/dL — ABNORMAL HIGH (ref 70–99)
Glucose-Capillary: 244 mg/dL — ABNORMAL HIGH (ref 70–99)

## 2021-12-25 LAB — MAGNESIUM: Magnesium: 1.6 mg/dL — ABNORMAL LOW (ref 1.7–2.4)

## 2021-12-25 MED ORDER — FUROSEMIDE 10 MG/ML IJ SOLN
80.0000 mg | Freq: Once | INTRAMUSCULAR | Status: AC
  Administered 2021-12-25: 80 mg via INTRAVENOUS
  Filled 2021-12-25: qty 8

## 2021-12-25 MED ORDER — MAGNESIUM SULFATE 4 GM/100ML IV SOLN
4.0000 g | Freq: Once | INTRAVENOUS | Status: AC
Start: 2021-12-25 — End: 2021-12-27
  Administered 2021-12-26: 4 g via INTRAVENOUS
  Filled 2021-12-25 (×2): qty 100

## 2021-12-25 MED ORDER — OXYCODONE-ACETAMINOPHEN 5-325 MG PO TABS
1.0000 | ORAL_TABLET | Freq: Four times a day (QID) | ORAL | Status: DC | PRN
  Administered 2021-12-25 – 2021-12-28 (×5): 1 via ORAL
  Filled 2021-12-25 (×5): qty 1

## 2021-12-25 MED ORDER — SPIRONOLACTONE 25 MG PO TABS
25.0000 mg | ORAL_TABLET | Freq: Every day | ORAL | 11 refills | Status: DC
  Filled 2021-12-25 – 2022-01-27 (×2): qty 30, 30d supply, fill #0
  Filled 2022-02-26: qty 30, 30d supply, fill #1
  Filled 2022-04-21: qty 30, 30d supply, fill #2
  Filled 2022-04-28 – 2022-05-26 (×2): qty 30, 30d supply, fill #3
  Filled 2022-07-01: qty 30, 30d supply, fill #4
  Filled 2022-07-28: qty 30, 30d supply, fill #5
  Filled 2022-09-15: qty 30, 30d supply, fill #6

## 2021-12-25 MED ORDER — TORSEMIDE 20 MG PO TABS
40.0000 mg | ORAL_TABLET | Freq: Every day | ORAL | 11 refills | Status: DC
  Filled 2021-12-25: qty 60, 30d supply, fill #0

## 2021-12-25 MED ORDER — DAPAGLIFLOZIN PROPANEDIOL 10 MG PO TABS
10.0000 mg | ORAL_TABLET | Freq: Every day | ORAL | Status: DC
  Administered 2021-12-25 – 2021-12-26 (×2): 10 mg via ORAL
  Filled 2021-12-25 (×2): qty 1

## 2021-12-25 MED ORDER — VITAMIN B-12 1000 MCG PO TABS
1000.0000 ug | ORAL_TABLET | Freq: Every day | ORAL | Status: DC
  Administered 2021-12-25 – 2021-12-29 (×5): 1000 ug via ORAL
  Filled 2021-12-25 (×5): qty 1

## 2021-12-25 MED ORDER — DAPAGLIFLOZIN PROPANEDIOL 10 MG PO TABS
10.0000 mg | ORAL_TABLET | Freq: Every day | ORAL | 11 refills | Status: DC
  Filled 2021-12-25: qty 30, 30d supply, fill #0

## 2021-12-25 MED ORDER — ALBUTEROL SULFATE (2.5 MG/3ML) 0.083% IN NEBU
2.5000 mg | INHALATION_SOLUTION | RESPIRATORY_TRACT | Status: DC | PRN
Start: 2021-12-25 — End: 2021-12-29

## 2021-12-25 MED ORDER — SPIRONOLACTONE 25 MG PO TABS
25.0000 mg | ORAL_TABLET | Freq: Every day | ORAL | Status: DC
  Administered 2021-12-25 – 2021-12-29 (×5): 25 mg via ORAL
  Filled 2021-12-25 (×5): qty 1

## 2021-12-25 NOTE — Progress Notes (Signed)
Heart Failure Navigator Progress Note  Assessed for Heart & Vascular TOC clinic readiness.  Patient does not meet criteria due to already established with advanced HF team.   Jonanthony Nahar Boothby, PharmD, BCPS Heart Failure Stewardship Pharmacist Phone [phone]    

## 2021-12-25 NOTE — TOC Benefit Eligibility Note (Signed)
Patient Advocate Encounter  Insurance verification completed.    The patient is currently admitted and upon discharge could be taking Farxiga 10 mg.  The current 30 day co-pay is $0.00.   The patient is currently admitted and upon discharge could be taking Jardiance 10 mg.  The current 30 day co-pay is $0.00.   The patient is insured through [employer] Medicare Part D     Kenichi Cassada, CPhT Pharmacy Patient Advocate Specialist [facility] Patient Advocate Team Direct Number: [phone]  Fax: [phone]        

## 2021-12-25 NOTE — ED Notes (Signed)
Provided pt with two warm blankets. 

## 2021-12-25 NOTE — ED Notes (Signed)
RN setup pt breakfast tray and pt sitting on side of bed eating.

## 2021-12-25 NOTE — Progress Notes (Signed)
PROGRESS NOTE    Thomas West  BJS:[id number] DOB: 07-02-1960 DOA: 12/24/2021 PCP: [name], [name], MD  60/M with history of CAD, chronic systolic and diastolic CHF uncontrolled diabetes, NSVT, history of CVA, CKD, multiple hospitalizations with CHF.  7/17-7/20 admitted with heart failure, 7/22-7/25 admitted with N/V/D with AKI, hydrated, GDMT held, restarted on discharge and advised to resume Lasix after weight trended up. -Patient reports restarting Lasix 2 days after discharge however in the last 2 days noted worsening shortness of breath, dizziness chest and abdominal tightness and swelling, reports 7lb  weight gain.  -ED he was hypertensive, BNP was 2778, creatinine 1.2, chest x-ray was unremarkable   Subjective: -Feels better after diuresis, failed 2 or 3 canisters of urine already  Assessment and Plan:  Acute on chronic HFrEF (heart failure with reduced ejection fraction) (HCC) Cardiac amyloidosis -Last echo 6/23 with a EF of 30-35%, grade 1 DD, RV function was normal Presenting with progressive weight gain, DOE, BNP >2700.  -Frequent admissions for same -Continue IV Lasix 40 Mg twice daily -Continue Entresto, Coreg, hydralazine and Imdur -Cardiology consulted -Tafamadis 61 mg daily ordered, patient was unable to bring this -Monitor BMP, daily weights  Hypertension associated with diabetes (HCC) -Improved, meds as above  Chronic kidney disease, stage 3a (HCC) -Improved from recent baseline, monitor with diuresis  Type 2 diabetes mellitus with diabetic neuropathy, unspecified (HCC) -A1c recently was 7.3  History of CVA (cerebrovascular accident) without residual deficits Continue aspirin, Plavix, atorvastatin.  Hyperlipidemia associated with type 2 diabetes mellitus (HCC) Continue atorvastatin.  CAD (coronary artery disease) Hx of PCI to RPAV  -Troponin minimally elevated 16 > 20.  No acute ischemic changes on EKG. -Continue Coreg and Imdur -Continue aspirin,  Plavix, atorvastatin  Nausea vomiting -Resolved  DVT prophylaxis: Lovenox Code Status: Full code Family Communication: Discussed patient in detail no family at bedside Disposition Plan: Home pending adequate diuresis  Consultants:    Procedures:   Antimicrobials:    Objective: Vitals:   12/25/21 0922 12/25/21 0924 12/25/21 1015 12/25/21 1045  BP:  (!) 143/91 (!) 149/101 (!) 140/99  Pulse:   82 81  Resp:   19 20  Temp: 97.6 F (36.4 C)     TempSrc: Oral     SpO2:   98% 99%  Weight:      Height:        Intake/Output Summary (Last 24 hours) at 12/25/2021 1212 Last data filed at 12/25/2021 0756 Gross per 24 hour  Intake --  Output 400 ml  Net -400 ml   Filed Weights   12/24/21 1722  Weight: 70.8 kg    Examination:  General exam: Appears calm and comfortable  HEENT: Positive JVD Respiratory system: Clear to auscultation Cardiovascular system: S1 & S2 heard, RRR.  Abd: nondistended, soft and nontender.Normal bowel sounds heard. Central nervous system: Alert and oriented. No focal neurological deficits. Extremities: 1-2+ edema Skin: No rashes Psychiatry:  Mood & affect appropriate.     Data Reviewed:   CBC: Recent Labs  Lab 12/24/21 1725 12/25/21 0309  WBC 7.4 6.5  NEUTROABS 4.2  --   HGB 11.9* 12.0*  HCT 37.5* 36.2*  MCV 88.2 85.4  PLT 300 305   Basic Metabolic Panel: Recent Labs  Lab 12/24/21 1725 12/25/21 0309  NA 141 143  K 3.8 4.0  CL 110 110  CO2 21* 24  GLUCOSE 142* 134*  BUN 17 15  CREATININE 1.23 1.23  CALCIUM 8.3* 8.8*  MG  --  1.6*  GFR: Estimated Creatinine Clearance: 65.5 mL/min (by C-G formula based on SCr of 1.23 mg/dL). Liver Function Tests: No results for input(s): "AST", "ALT", "ALKPHOS", "BILITOT", "PROT", "ALBUMIN" in the last 168 hours. No results for input(s): "LIPASE", "AMYLASE" in the last 168 hours. No results for input(s): "AMMONIA" in the last 168 hours. Coagulation Profile: No results for input(s): "INR",  "PROTIME" in the last 168 hours. Cardiac Enzymes: No results for input(s): "CKTOTAL", "CKMB", "CKMBINDEX", "TROPONINI" in the last 168 hours. BNP (last 3 results) Recent Labs    02/03/21 0911  PROBNP 3,012.0*   HbA1C: No results for input(s): "HGBA1C" in the last 72 hours. CBG: Recent Labs  Lab 12/24/21 2229 12/25/21 0755 12/25/21 1144  GLUCAP 113* 139* 145*   Lipid Profile: No results for input(s): "CHOL", "HDL", "LDLCALC", "TRIG", "CHOLHDL", "LDLDIRECT" in the last 72 hours. Thyroid Function Tests: No results for input(s): "TSH", "T4TOTAL", "FREET4", "T3FREE", "THYROIDAB" in the last 72 hours. Anemia Panel: No results for input(s): "VITAMINB12", "FOLATE", "FERRITIN", "TIBC", "IRON", "RETICCTPCT" in the last 72 hours. Urine analysis:    Component Value Date/Time   COLORURINE YELLOW 11/10/2021 1937   APPEARANCEUR CLEAR 11/10/2021 1937   LABSPEC 1.012 11/10/2021 1937   PHURINE 5.0 11/10/2021 1937   GLUCOSEU NEGATIVE 11/10/2021 1937   HGBUR NEGATIVE 11/10/2021 1937   BILIRUBINUR NEGATIVE 11/10/2021 1937   BILIRUBINUR negative 01/02/2021 1312   KETONESUR NEGATIVE 11/10/2021 1937   PROTEINUR 100 (A) 11/10/2021 1937   UROBILINOGEN 0.2 01/02/2021 1312   NITRITE NEGATIVE 11/10/2021 1937   LEUKOCYTESUR NEGATIVE 11/10/2021 1937   Sepsis Labs: @LABRCNTIP (procalcitonin:4,lacticidven:4)  )No results found for this or any previous visit (from the past 240 hour(s)).   Radiology Studies: DG Chest 2 View  Result Date: 12/24/2021 CLINICAL DATA:  Shortness of breath, chest pain EXAM: CHEST - 2 VIEW COMPARISON:  12/12/2021 FINDINGS: Transverse diameter of heart is slightly increased. There are no signs of pulmonary edema or focal pulmonary consolidation. There is no pleural effusion or pneumothorax. There is peribronchial thickening. IMPRESSION: There are no signs of pulmonary edema or focal pulmonary consolidation. Peribronchial thickening suggests bronchitis. Electronically Signed    By: 12/14/2021 M.D.   On: 12/24/2021 19:04     Scheduled Meds:  aspirin EC  81 mg Oral Daily   atorvastatin  80 mg Oral Daily   carvedilol  6.25 mg Oral BID WC   clopidogrel  75 mg Oral Daily   enoxaparin (LOVENOX) injection  40 mg Subcutaneous Q24H   furosemide  40 mg Intravenous Q12H   hydrALAZINE  50 mg Oral TID   insulin aspart  0-5 Units Subcutaneous QHS   insulin aspart  0-9 Units Subcutaneous TID WC   isosorbide mononitrate  30 mg Oral Daily   lidocaine  1 patch Transdermal Q24H   potassium chloride  20 mEq Oral Daily   sacubitril-valsartan  0.5 tablet Oral BID   sodium chloride flush  3 mL Intravenous Q12H   Tafamidis  61 mg Oral Daily   Continuous Infusions:   LOS: 1 day    Time spent: 02/23/2022    , MD [facility]   12/25/2021, 12:12 PM

## 2021-12-25 NOTE — TOC Initial Note (Addendum)
Transition of Care [facility]) - Initial/Assessment Note    Patient Details  Name: Thomas West MRN: [id number] Date of Birth: 03-08-1961  Transition of Care [facility]) CM/SW Contact:    Elliot Cousin, RN Phone Number: [phone] 12/25/2021, 1:56 PM  Clinical Narrative:                 HF TOC CM spoke to pt and states he remains active with [facility]. Will need resumption of HHRN, PT orders with F2F prior to dc. Pt minor son lives in home but pt states his sister is overseeing his care. Pt states he drives but limited due to the neuropathy in his feet. States it is getting better after taking B12 supplements. Pt reports weighing daily and his will prepare low sodium meals. States his blood sugars have improved since she is assisting. Contacted [facility] rep, Morrie Sheldon to make aware.   Pt is also followed by HF Paramedicine team outpatient. If dc over weekend, pt will call his family for transport home.   Expected Discharge Plan: Home w Home Health Services Barriers to Discharge: Continued Medical Work up   Patient Goals and CMS Choice Patient states their goals for this hospitalization and ongoing recovery are:: wants to get back to being independent CMS Medicare.gov Compare Post Acute Care list provided to:: Patient Choice offered to / list presented to : Patient  Expected Discharge Plan and Services Expected Discharge Plan: Home w Home Health Services   Discharge Planning Services: CM Consult Post Acute Care Choice: Home Health Living arrangements for the past 2 months: Apartment                           HH Arranged: RN, PT HH Agency: [facility] Date HH Agency Contacted: 12/25/21 Time HH Agency Contacted: 1355 Representative spoke with at [facility] Agency: Morrie Sheldon  Prior Living Arrangements/Services Living arrangements for the past 2 months: Apartment Lives with:: Minor Children Patient language and need for interpreter reviewed:: Yes Do you feel safe  going back to the place where you live?: Yes      Need for Family Participation in Patient Care: No (Comment) Care giver support system in place?: No (comment) Current home services: DME, Home RN (rollator) Criminal Activity/Legal Involvement Pertinent to Current Situation/Hospitalization: No - Comment as needed  Activities of Daily Living      Permission Sought/Granted Permission sought to share information with : Case Manager, Family Supports, PCP Permission granted to share information with : Yes, Verbal Permission Granted  Share Information with NAME: Kym Groom     Permission granted to share info w Relationship: sister  Permission granted to share info w Contact Information: [phone]  Emotional Assessment Appearance:: Appears stated age Attitude/Demeanor/Rapport: Engaged Affect (typically observed): Accepting Orientation: : Oriented to Self, Oriented to Place, Oriented to  Time, Oriented to Situation   Psych Involvement: No (comment)  Admission diagnosis:  Acute on chronic HFrEF (heart failure with reduced ejection fraction) (HCC) [I50.23] Patient Active Problem List   Diagnosis Date Noted   History of CVA (cerebrovascular accident) without residual deficits 12/24/2021   AKI (acute kidney injury) (HCC) 12/13/2021   Diarrhea 12/13/2021   Shortness of breath 12/13/2021   Chest pain 12/13/2021   Nausea and vomiting 12/13/2021   Heart failure (HCC) 12/08/2021   Hypertension associated with diabetes (HCC) 07/12/2021   Chronic kidney disease, stage 3a (HCC) 07/12/2021   Hyperkalemia 07/12/2021   CHF  exacerbation (HCC) 04/20/2021   Acute on chronic combined systolic and diastolic CHF (congestive heart failure) (HCC) 03/09/2021   Acute on chronic HFrEF (heart failure with reduced ejection fraction) (HCC) 03/08/2021   HFrEF (heart failure with reduced ejection fraction) (HCC) 02/03/2021   Diabetes mellitus (HCC) 03/27/2019   Hyperlipidemia associated with type 2  diabetes mellitus (HCC) 03/27/2019   CAD (coronary artery disease) 03/05/2019   History of MI (myocardial infarction) 03/05/2019   Type 2 diabetes mellitus with diabetic neuropathy, unspecified (HCC) 03/05/2019   CKD (chronic kidney disease), stage II 03/05/2019   HLD (hyperlipidemia) 03/05/2019   GERD (gastroesophageal reflux disease) 03/05/2019   Hypertension, essential, benign 03/05/2019   PCP:  [name], [name], MD Pharmacy:   [facility] - [place], [place] - [address] [facility] [address] [place] [address] Phone: [phone] Fax: [phone]     Social Determinants of Health (SDOH) Interventions    Readmission Risk Interventions    07/14/2021    4:34 PM  Readmission Risk Prevention Plan  Transportation Screening Complete  HRI or Home Care Consult Complete  Social Work Consult for Recovery Care Planning/Counseling Complete  Palliative Care Screening Not Applicable  Medication Review Ocean[employer]) Complete

## 2021-12-25 NOTE — ED Notes (Signed)
RN notified Thomas Cove MD that pt would like to take his B12 medicine in order to take his other medications.

## 2021-12-25 NOTE — ED Notes (Signed)
Pt was informed Tafamidis is not available in hospital and someone will need to bring his medicine from home. Pt state once he get a charger he will see if he can contact someone. At the moment he doesn't have anyone that is free.

## 2021-12-25 NOTE — Progress Notes (Signed)
  REDS VEST READING= 36% CHEST RULER=30 STATION=C

## 2021-12-25 NOTE — Consult Note (Addendum)
Advanced Heart Failure Team Consult Note   Primary Physician: [name], [name], MD PCP-Cardiologist:  Jenkins Rouge, MD  Reason for Consultation: A/C systolic HF  HPI:    Thomas West is seen today for evaluation of chronic systolic CHF at the request of Dr. Broadus John.    Thomas West is a 61 y.o. male with history of CAD with prior MI and stent in 01/2019 at [facility] in [place] (report not available), cardiomyopathy/chronic systolic HF, uncontrolled DM II, NSVT, HTN, hx CVA, CKD.    Recently admitted 12/07/21-12/10/21 post fall w/ A/C systolic heart failure. Diuresed well with IV lasix/metolazone and discharged. He developed an AKI Cr >> 1.2>>1.75, Delene Loll and Arlyce Harman were held at the time and was instructed to restart the next day following discharge. He was seen by paramedicine the following day and he was in no acute distress, taking all meds. Denied CP, SOB and overall feeling well during their visit.    Readmitted 7/22 w/ 2 days of persistent N/V/D with intermittent CP and SOB. Presented with AKI cr 3.3 2/2 emesis/diarrhea. Overall responded well to 510m NS bolus and holding of entresto and spiro. Restarted meds once discharged.   He presented to the ED with worsening SOB, emesis, dizziness, BLE edema and chest tightness since last discharge. Gained 7 pounds over 2 days. Endorses starting his Lasix as instructed, when his weight reached 152lbs, no improvement.   In the ED pt was hypertensive at 189/125, K 3.8, Cr 1.23, BNP 2778.6, Trop 16, CXR:  negative for focal consolidation, edema, effusion.  Peribronchial thickening noted. 148lbs on last discharge>>156 on admission. Given 457mIV lasix with good UOP. Minimal documentation but per pt he's been having good UOP. 3 urinals w/ urine at bedside.    Laying in stBiomedical scientistStates that he drinks 3-4 water bottles a day with occasional juice. Cooks his meals, eats vegetables and no high salt intake. Denies ETOH. Paramedicine fills his pill box  and states he's compliant with all meds, 1424ear old son also helps him. Patient unsure of what all meds he's taking but does know he added the [id number] tablet to his daily regimen once his weight started to increase. Able to ambulate at home with walker. Denies orthopnea, CP and SOB.  Imaging reviewed: - Echo (10/22): EF 20-25%, RV ok - R/LHC (10/22): Patent RPLV stent with nonobstructive disease elsewhere, Preserved CO/CI, RA mean 5 mmHg, PCWP mean 5 mmHg - cMRI (11/22): LVEF 24% RVEF 21% LGE and ECW suggestive of cardiac amyloidosis. 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 I thought equivocal but based on cMRI likely positive Will need genetic testing. - Multiple myeloma panel (12/22) with no m-spike. - ECHO (6/23) LVEF 30-35%, LV global hypokinesis, Grade 1 DD, RV function normal  Review of Systems: [y] = yes, _0  = no   General: Weight gain [ Y]; Weight loss _1 ; Anorexia _2 ; Fatigue _3 ; Fever _4 ; Chills _5 ; Weakness _6   Cardiac: Chest pain/pressure [ Y]; Resting SOB _7 ; Exertional SOB [Y ]; Orthopnea _8 ; Pedal Edema _9 ; Palpitations _10 ; Syncope _11 ; Presyncope _12 ; Paroxysmal nocturnal dyspnea_13   Pulmonary: Cough [ Y]; Wheezing_14 ; Hemoptysis_15 ; Sputum _16 ; Snoring _17   GI: Vomiting[ Y]; Dysphagia_18 ; Melena_19 ; Hematochezia _20 ; Heartburn_21 ; Abdominal pain _22 ; Constipation _23 ; Diarrhea _24 ; BRBPR _25   GU: Hematuria_26 ; Dysuria _27 ; Nocturia_28   Vascular: Pain  in legs with walking _0 ; Pain in feet with lying flat _1 ; Non-healing sores _2 ; Stroke _3 ; TIA _4 ; Slurred speech _5 ;  Neuro: Headaches_6 ; Vertigo_7 ; Seizures_8 ; Paresthesias_9 ;Blurred vision _10 ; Diplopia _11 ; Vision changes _12   Ortho/Skin: Arthritis _13 ; Joint pain _14 ; Muscle pain _15 ; Joint swelling _16 ; Back Pain _17 ; Rash _18   Psych: Depression_19 ; Anxiety_20   Heme: Bleeding problems _21 ; Clotting disorders _22 ; Anemia _23   Endocrine: Diabetes [ Y]; Thyroid dysfunction_24   Home Medications Prior  to Admission medications   Medication Sig Start Date End Date Taking? Authorizing Provider  aspirin EC 81 MG tablet Take 1 tablet (81 mg total) by mouth daily. Swallow whole. 12/11/21  Yes Earnie Larsson, NP  atorvastatin (LIPITOR) 80 MG tablet Take 1 tablet (80 mg total) by mouth daily. 12/10/21 07/08/22 Yes Earnie Larsson, NP  carvedilol (COREG) 6.25 MG tablet Take 1 tablet (6.25 mg total) by mouth EVERY 12 HOURS (10AM &10PM) 12/10/21 07/08/22 Yes Earnie Larsson, NP  clopidogrel (PLAVIX) 75 MG tablet Take 75 mg by mouth daily.   Yes [provider]  cyanocobalamin 1000 MCG tablet Take 1 tablet (1,000 mcg total) by mouth daily. 12/16/21  Yes Patrecia Pour, MD  furosemide (LASIX) 40 MG tablet Take 1 tablet (40 mg total) by mouth daily. Resume once weight comes up to 152lbs. 12/15/21  Yes Patrecia Pour, MD  gabapentin (NEURONTIN) 400 MG capsule Take 400 mg by mouth daily.   Yes [provider]  hydrALAZINE (APRESOLINE) 50 MG tablet Take 1 tablet (50 mg total) by mouth 3 (three) times daily. 12/10/21 07/08/22 Yes Earnie Larsson, NP  hydrocortisone cream 1 % Apply to affected area 2 times daily Patient taking differently: Apply 1 Application topically See admin instructions. Apply to affected areas of the back 3 times a day 12/10/21  Yes Forestine Na L, NP  isosorbide mononitrate (IMDUR) 30 MG 24 hr tablet Take 1 tablet (30 mg total) by mouth daily. 12/10/21  Yes Earnie Larsson, NP  sacubitril-valsartan (ENTRESTO) 97-103 MG Take 0.5 tablets by mouth 2 (two) times daily. Patient taking differently: Take 1 tablet by mouth 2 (two) times daily. 12/15/21  Yes Patrecia Pour, MD  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 Take 1 capsule by mouth daily. 09/02/21  Yes Bensimhon, Shaune Pascal, MD    Past Medical History: Past Medical History:  Diagnosis Date   CHF (congestive heart failure) ([place])    Coronary artery disease    Diabetes mellitus without complication ([place])    History of MI (myocardial infarction) 03/05/2019   Hypertension     Stroke [facility])     Past Surgical History: Past Surgical History:  Procedure Laterality Date   leg surgery     "pins in left shin"   RIGHT/LEFT HEART CATH AND CORONARY ANGIOGRAPHY N/A 03/11/2021   Procedure: RIGHT/LEFT HEART CATH AND CORONARY ANGIOGRAPHY;  Surgeon: Early Osmond, MD;  Location: [facility];  Service: Cardiovascular;  Laterality: N/A;    Family History: History reviewed. No pertinent family history.  Social History: Social History   Socioeconomic History   Marital status: Single    Spouse name: Not on file   Number of children: Not on file   Years of education: Not on file   Highest education level: Not on file  Occupational History   Not on file  Tobacco Use  Smoking status: Former    Types: Cigarettes    Quit date: 10/06/2017    Years since quitting: 4.2   Smokeless tobacco: Never  Substance and Sexual Activity   Alcohol use: Not Currently   Drug use: Yes    Types: Marijuana   Sexual activity: Not on file  Other Topics Concern   Not on file  Social History Narrative   Not on file   Social Determinants of Health   Financial Resource Strain: Low Risk  (03/10/2021)   Overall Financial Resource Strain (CARDIA)    Difficulty of Paying Living Expenses: Not very hard  Food Insecurity: Food Insecurity Present (12/07/2021)   Hunger Vital Sign    Worried About Running Out of Food in the Last Year: Sometimes true    Ran Out of Food in the Last Year: Never true  Transportation Needs: Unmet Transportation Needs (08/25/2021)   PRAPARE - Hydrologist (Medical): Yes    Lack of Transportation (Non-Medical): Yes  Physical Activity: Not on file  Stress: Not on file  Social Connections: Not on file    Allergies:  Allergies  Allergen Reactions   Bee Venom Anaphylaxis, Swelling and Other (See Comments)    Swells all over    Objective:    Vital Signs:   Temp:  [97.6 F (36.4 C)-98.5 F (36.9 C)] 97.6 F (36.4 C)  (08/04 0922) Pulse Rate:  [75-108] 81 (08/04 1045) Resp:  [13-23] 20 (08/04 1045) BP: (130-190)/(77-150) 140/99 (08/04 1045) SpO2:  [95 %-99 %] 99 % (08/04 1045) Weight:  [70.8 kg] 70.8 kg (08/03 1722)    Weight change: Filed Weights   12/24/21 1722  Weight: 70.8 kg    Intake/Output:   Intake/Output Summary (Last 24 hours) at 12/25/2021 1059 Last data filed at 12/25/2021 0756 Gross per 24 hour  Intake --  Output 400 ml  Net -400 ml      Physical Exam    General:  Well appearing. No resp difficulty HEENT: normal Neck: supple. JVP ~12. Carotids 2+ bilat; no bruits. No lymphadenopathy or thyromegaly appreciated. Cor: PMI nondisplaced. Regular rate & rhythm. No rubs, gallops or murmurs. Lungs: clear, diminished at bases Abdomen: soft, nontender, nondistended. No hepatosplenomegaly. No bruits or masses. Good bowel sounds. Extremities: no cyanosis, clubbing, rash, trace edema BLE Neuro: alert & orientedx3, cranial nerves grossly intact. moves all 4 extremities w/o difficulty. Affect pleasant  Telemetry   NSR 70's. PVC's 1-2/hr (Personally reviewed)    EKG    NSR 92bpm, probable LA enlargement  Labs   Basic Metabolic Panel: Recent Labs  Lab 12/24/21 1725 12/25/21 0309  NA 141 143  K 3.8 4.0  CL 110 110  CO2 21* 24  GLUCOSE 142* 134*  BUN 17 15  CREATININE 1.23 1.23  CALCIUM 8.3* 8.8*  MG  --  1.6*    Liver Function Tests: No results for input(s): "AST", "ALT", "ALKPHOS", "BILITOT", "PROT", "ALBUMIN" in the last 168 hours. No results for input(s): "LIPASE", "AMYLASE" in the last 168 hours. No results for input(s): "AMMONIA" in the last 168 hours.  CBC: Recent Labs  Lab 12/24/21 1725 12/25/21 0309  WBC 7.4 6.5  NEUTROABS 4.2  --   HGB 11.9* 12.0*  HCT 37.5* 36.2*  MCV 88.2 85.4  PLT 300 305    Cardiac Enzymes: No results for input(s): "CKTOTAL", "CKMB", "CKMBINDEX", "TROPONINI" in the last 168 hours.  BNP: BNP (last 3 results) Recent Labs     12/07/21 1130 12/12/21  1643 12/24/21 1810  BNP 3,127.4* 147.4* 2,778.6*    ProBNP (last 3 results) Recent Labs    02/03/21 0911  PROBNP 3,012.0*     CBG: Recent Labs  Lab 12/24/21 2229 12/25/21 0755  GLUCAP 113* 139*    Coagulation Studies: No results for input(s): "LABPROT", "INR" in the last 72 hours.   Imaging   DG Chest 2 View  Result Date: 12/24/2021 CLINICAL DATA:  Shortness of breath, chest pain EXAM: CHEST - 2 VIEW COMPARISON:  12/12/2021 FINDINGS: Transverse diameter of heart is slightly increased. There are no signs of pulmonary edema or focal pulmonary consolidation. There is no pleural effusion or pneumothorax. There is peribronchial thickening. IMPRESSION: There are no signs of pulmonary edema or focal pulmonary consolidation. Peribronchial thickening suggests bronchitis. Electronically Signed   By: Elmer Picker M.D.   On: 12/24/2021 19:04     Medications:     Current Medications:  aspirin EC  81 mg Oral Daily   atorvastatin  80 mg Oral Daily   carvedilol  6.25 mg Oral BID WC   clopidogrel  75 mg Oral Daily   enoxaparin (LOVENOX) injection  40 mg Subcutaneous Q24H   furosemide  40 mg Intravenous Q12H   hydrALAZINE  50 mg Oral TID   insulin aspart  0-5 Units Subcutaneous QHS   insulin aspart  0-9 Units Subcutaneous TID WC   isosorbide mononitrate  30 mg Oral Daily   lidocaine  1 patch Transdermal Q24H   potassium chloride  20 mEq Oral Daily   sacubitril-valsartan  0.5 tablet Oral BID   sodium chloride flush  3 mL Intravenous Q12H   Tafamidis  61 mg Oral Daily    Infusions:    Patient Profile   Thomas West is a 61 y.o. male with history of CAD with prior MI and stent in 01/2019 at [facility] in [place] (report not available), cardiomyopathy/chronic systolic HF, uncontrolled DM II, HTN, hx CVA, CKD.  Thomas West was recently admitted 2/56/38-9/37/34 for A/C systolic HF, back again 2/87-6/81 d/t persistent N/V/D with chest pain and SOB.  Returned today with A/C systolic HF.  Assessment/Plan   Chronic systolic HF/Cardiac amyloidosis - Onset not certain. He reports cardiomyopathy diagnosed just prior to MI while living in [place] a couple of years ago. - Echo (10/22): EF 20-25%, RV ok - R/LHC (10/22): Patent RPLV stent with nonobstructive disease elsewhere, Preserved CO/CI, RA mean 5 mmHg, PCWP mean 5 mmHg - cMRI (11/22): LVEF 24% RVEF 21% LGE and ECW suggestive of cardiac amyloidosis. 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 I thought equivocal but based on cMRI likely positive Will need genetic testing. - Multiple myeloma panel (12/22) with no m-spike. - ECHO (6/23) LVEF 30-35%, LV global hypokinesis, Grade 1 DD, RV function normal - Presented with NYHA III. Was hypertensive at 189/125, K 3.8, Cr 1.23, BNP 2778.6, Trop 16>>20, CXR: (-) for focal consolidation, edema, effusion.  - On assessment pt appears volume overloaded. States good UOP. Weight up 8lbs since last admission. JVD ~12 - Give 26m IV lasix this afternoon. Will reassess fluid tomorrow.  - Continue hydralazine 50 mg tid and Imdur 30 mg daily.  - Continue Entresto 49/51 mg bid, consider increasing prior to d/c - Continue carvedilol 6.25 mg bid.  - Restart spironolactone 25 mg daily  - Can consider SGLT2i now that A1c is down - Await genetic testing.   - Tafamadis not brought from home & not stocked by pharmacy. States no one can bring at  the moment. Restart if able to be brought in, otherwise at discharge. - Could consider cardioMEMS - Daily BMET. K 4, Cr 1.23 - Daily weights, strict I&O   2. CAD - Denies CP, chest tightness d/t fluid - Prior MI followed by PCI in [place] in either 2019 or 2020.  - Patent RPLV stent on [facility] 10/22. Also had 20% distal RCA, 30% p LAD, 70% d LAD, 60% OM2 - Stent placed 2020. >25yrsince stent placement, could consider stopping Plavix d/t issues with noncompliance. - Continue ASA  - Continue atorvastatin 80 mg daily.   3. HTN -  Elevated. - Continue hydralizine 515mTID and Imdur 3057maily   4. Uncontrolled DM - Needs PCP follow up. - On Januvia. - A1c down to 7.3 from 12.   5. PVCs - PVC's 0-1 per hr (Personally reviewed)  - Denies palpitations. - ECG ok today.   6. Neuropathy - This seems to be his biggest limiter. - On gabapentin. - Takes vitamin B12, states pain minimal now - Referred to Dr. [facility]. Consider Amvuttra.   7. SDOH - He has Medicaid and disability. - Transportation is an issue, he has a car but unable to drive (needs license). - 13 78ar old son helps with meds. - Paramedicine follows outpatient   8. N/V - per primary - no N/V since admitted   9. CKD stage II-IIIa - Baseline creatine 1.2-1.4, Today 1.23 - Daily BMET  10. Hx fall - CT: L-spine on 12/08/2021 was negative for acute fracture or subluxation of the L-spine. - Fall risk precautions - per primary   Length of Stay: 1  [place]  12/25/2021, 10:59 AM  Advanced Heart Failure Team Pager [phone]; 7a - 5p)  Please contact [email or web address] for night-coverage after hours (4p -7a ) and weekends on [email or web address]  Patient seen with NP, agree with the above note.   History as described above.  He was instructed to come to the ER by his home paramedic.  Weight is up and he has become progressively short of breath.  He had recently restarted Lasix 40 mg daily after admission with AKI and overdiuresis.   TnI not significantly elevated, BNP elevated.   Good response to initial IV Lasix in the ER, feels better.   General: NAD Neck: JVP difficult, ?8-9, no thyromegaly or thyroid nodule.  Lungs: Clear to auscultation bilaterally with normal respiratory effort. CV: Nondisplaced PMI.  Heart regular S1/S2, no S3/S4, no murmur.  No peripheral edema.  No carotid bruit.  Normal pedal pulses.  Abdomen: Soft, nontender, no hepatosplenomegaly, no distention.  Skin: Intact without lesions or rashes.   Neurologic: Alert and oriented x 3.  Psych: Normal affect. Extremities: No clubbing or cyanosis.  HEENT: Normal.   I saw patient in the ER after he had had Lasix 80 mg IV and diuresed well.  He actually does not look markedly volume overloaded to me but he has had increased dyspnea with weight gain and BNP is high.   - Agree with Lasix 80 mg IV this evening and reassess in am.  - Add Farxiga.   - BP high, if it stays high after he gets all his meds will increase Entresto tomorrow.  - REDS clip today => 36%, probably can get 1 more dose of IV Lasix this evening and then back to po.   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   DalLoralie Champagne4/2023  1:49 PM

## 2021-12-25 NOTE — ED Notes (Signed)
Provided pt new gown after washup

## 2021-12-25 NOTE — ED Notes (Signed)
RN provided pt toiletries so pt can wash up and brush teeth at the bedside.

## 2021-12-25 NOTE — ED Notes (Signed)
Pt reeived lunch tray.

## 2021-12-26 DIAGNOSIS — I5023 Acute on chronic systolic (congestive) heart failure: Secondary | ICD-10-CM | POA: Diagnosis not present

## 2021-12-26 LAB — BASIC METABOLIC PANEL
Anion gap: 10 (ref 5–15)
BUN: 26 mg/dL — ABNORMAL HIGH (ref 6–20)
CO2: 24 mmol/L (ref 22–32)
Calcium: 8.8 mg/dL — ABNORMAL LOW (ref 8.9–10.3)
Chloride: 103 mmol/L (ref 98–111)
Creatinine, Ser: 1.78 mg/dL — ABNORMAL HIGH (ref 0.61–1.24)
GFR, Estimated: 44 mL/min — ABNORMAL LOW (ref >60)
Glucose, Bld: 149 mg/dL — ABNORMAL HIGH (ref 70–99)
Potassium: 3.9 mmol/L (ref 3.5–5.1)
Sodium: 137 mmol/L (ref 135–145)

## 2021-12-26 LAB — GLUCOSE, CAPILLARY
Glucose-Capillary: 146 mg/dL — ABNORMAL HIGH (ref 70–99)
Glucose-Capillary: 164 mg/dL — ABNORMAL HIGH (ref 70–99)
Glucose-Capillary: 113 mg/dL — ABNORMAL HIGH (ref 70–99)
Glucose-Capillary: 234 mg/dL — ABNORMAL HIGH (ref 70–99)

## 2021-12-26 MED ORDER — POLYETHYLENE GLYCOL 3350 17 G PO PACK
17.0000 g | PACK | Freq: Every day | ORAL | Status: DC
  Administered 2021-12-26: 17 g via ORAL
  Filled 2021-12-26: qty 1

## 2021-12-26 MED ORDER — SENNOSIDES-DOCUSATE SODIUM 8.6-50 MG PO TABS
1.0000 | ORAL_TABLET | Freq: Two times a day (BID) | ORAL | Status: DC
  Administered 2021-12-26 (×2): 1 via ORAL
  Filled 2021-12-26 (×2): qty 1

## 2021-12-26 MED ORDER — SACUBITRIL-VALSARTAN 97-103 MG PO TABS
0.5000 | ORAL_TABLET | Freq: Two times a day (BID) | ORAL | Status: DC
  Filled 2021-12-26: qty 0.5

## 2021-12-26 NOTE — Progress Notes (Signed)
Mobility Specialist Progress Note:   12/26/21 1512  Mobility  Activity Ambulated with assistance in room  Level of Assistance Standby assist, set-up cues, supervision of patient - no hands on  Assistive Device Front wheel walker  Distance Ambulated (ft) 40 ft  Activity Response Tolerated well  $Mobility charge 1 Mobility   Pt received in bed willing to participate in mobility. No complaints of pain. Left EOB with call bell in reach and all needs met.   Thomas West Mobility Specialist  

## 2021-12-26 NOTE — Progress Notes (Signed)
Patient ID: Thomas West, male   DOB: Jun 03, 1960, 61 y.o.   MRN: [id number]     Advanced Heart Failure Rounding Note  PCP-Cardiologist: Jenkins Rouge, MD   Subjective:    No dyspnea today.   He diuresed well overnight.  Creatinine 1.23 => 1.78.  BP stable.    Only complaint today is bloating/abdominal pain.  Feels constipated.    Objective:   Weight Range: 66.2 kg Body mass index is 21.54 kg/m.   Vital Signs:   Temp:  [97.6 F (36.4 C)-98.4 F (36.9 C)] 98.4 F (36.9 C) (08/05 0504) Pulse Rate:  [73-93] 79 (08/05 0504) Resp:  [14-22] 20 (08/05 0504) BP: (93-154)/(75-113) 117/80 (08/05 0504) SpO2:  [95 %-99 %] 96 % (08/05 0504) Weight:  [66.2 kg-67.9 kg] 66.2 kg (08/05 0504) Last BM Date : 12/23/21  Weight change: Filed Weights   12/24/21 1722 12/25/21 1622 12/26/21 0504  Weight: 70.8 kg 67.9 kg 66.2 kg    Intake/Output:   Intake/Output Summary (Last 24 hours) at 12/26/2021 1001 Last data filed at 12/26/2021 0800 Gross per 24 hour  Intake 723 ml  Output 2050 ml  Net -1327 ml      Physical Exam    General:  Well appearing. No resp difficulty HEENT: Normal Neck: Supple. JVP not elevated. Carotids 2+ bilat; no bruits. No lymphadenopathy or thyromegaly appreciated. Cor: PMI nondisplaced. Regular rate & rhythm. No rubs, gallops or murmurs. Lungs: Clear Abdomen: Soft, nontender, nondistended. No hepatosplenomegaly. No bruits or masses. Good bowel sounds. Extremities: No cyanosis, clubbing, rash, edema Neuro: Alert & orientedx3, cranial nerves grossly intact. moves all 4 extremities w/o difficulty. Affect pleasant   Telemetry   NSR 80s (personally reviewed)  Labs    CBC Recent Labs    12/24/21 1725 12/25/21 0309  WBC 7.4 6.5  NEUTROABS 4.2  --   HGB 11.9* 12.0*  HCT 37.5* 36.2*  MCV 88.2 85.4  PLT 300 588   Basic Metabolic Panel Recent Labs    12/25/21 0309 12/26/21 0754  NA 143 137  K 4.0 3.9  CL 110 103  CO2 24 24  GLUCOSE 134* 149*  BUN 15  26*  CREATININE 1.23 1.78*  CALCIUM 8.8* 8.8*  MG 1.6*  --    Liver Function Tests No results for input(s): "AST", "ALT", "ALKPHOS", "BILITOT", "PROT", "ALBUMIN" in the last 72 hours. No results for input(s): "LIPASE", "AMYLASE" in the last 72 hours. Cardiac Enzymes No results for input(s): "CKTOTAL", "CKMB", "CKMBINDEX", "TROPONINI" in the last 72 hours.  BNP: BNP (last 3 results) Recent Labs    12/07/21 1130 12/12/21 1643 12/24/21 1810  BNP 3,127.4* 147.4* 2,778.6*    ProBNP (last 3 results) Recent Labs    02/03/21 0911  PROBNP 3,012.0*     D-Dimer No results for input(s): "DDIMER" in the last 72 hours. Hemoglobin A1C No results for input(s): "HGBA1C" in the last 72 hours. Fasting Lipid Panel No results for input(s): "CHOL", "HDL", "LDLCALC", "TRIG", "CHOLHDL", "LDLDIRECT" in the last 72 hours. Thyroid Function Tests No results for input(s): "TSH", "T4TOTAL", "T3FREE", "THYROIDAB" in the last 72 hours.  Invalid input(s): "FREET3"  Other results:   Imaging    No results found.   Medications:     Scheduled Medications:  aspirin EC  81 mg Oral Daily   atorvastatin  80 mg Oral Daily   carvedilol  6.25 mg Oral BID WC   clopidogrel  75 mg Oral Daily   vitamin B-12  1,000 mcg Oral Daily  dapagliflozin propanediol  10 mg Oral Daily   enoxaparin (LOVENOX) injection  40 mg Subcutaneous Q24H   hydrALAZINE  50 mg Oral TID   insulin aspart  0-5 Units Subcutaneous QHS   insulin aspart  0-9 Units Subcutaneous TID WC   isosorbide mononitrate  30 mg Oral Daily   lidocaine  1 patch Transdermal Q24H   polyethylene glycol  17 g Oral Daily   potassium chloride  20 mEq Oral Daily   [START ON 12/27/2021] sacubitril-valsartan  0.5 tablet Oral BID   senna-docusate  1 tablet Oral BID   sodium chloride flush  3 mL Intravenous Q12H   spironolactone  25 mg Oral Daily   Tafamidis  61 mg Oral Daily    Infusions:  magnesium sulfate bolus IVPB Stopped (12/25/21 1406)     PRN Medications: acetaminophen **OR** acetaminophen, albuterol, cyclobenzaprine, nitroGLYCERIN, ondansetron **OR** ondansetron (ZOFRAN) IV, oxyCODONE-acetaminophen    Assessment/Plan   Chronic systolic HF/Cardiac amyloidosis - Onset not certain. He reports cardiomyopathy diagnosed just prior to MI while living in [place] a couple of years ago. - Echo (10/22): EF 20-25%, RV ok - R/LHC (10/22): Patent RPLV stent with nonobstructive disease elsewhere, Preserved CO/CI, RA mean 5 mmHg, PCWP mean 5 mmHg - cMRI (11/22): LVEF 24% RVEF 21% LGE and ECW suggestive of cardiac amyloidosis. 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 I thought equivocal but based on cMRI likely positive Will need genetic testing. - Multiple myeloma panel (12/22) with no m-spike. - ECHO (6/23) LVEF 30-35%, LV global hypokinesis, Grade 1 DD, RV function normal - Presented with NYHA III. Was hypertensive at 189/125, K 3.8, Cr 1.23, BNP 2778.6, Trop 16>>20, CXR: (-) for focal consolidation, edema, effusion.  - Give 80mg IV lasix x 2 yesterday with good diuresis, REDS clip only 36% after initial Lasix yesterday.  He is not volume overloaded on exam today and creatinine mildly higher at 1.76.  - No diuretic today, start torsemide 20 mg daily for home tomorrow.  - Continue hydralazine 50 mg tid and Imdur 30 mg daily.  - Hold Entresto today with higher creatinine, restart tomorrow.  - Continue carvedilol 6.25 mg bid.  - Continue spironolactone 25 mg daily  - Continue dapagliflozin 10 daily.  - Await genetic testing for TTR cardiac amyloidosis.    - Tafamidis not brought from home & not stocked by pharmacy. States no one can bring at the moment. Restart if able to be brought in, otherwise at discharge. - Could consider cardioMEMS placement for better monitoring of volume status.    2. CAD - Denies CP, chest tightness d/t fluid - Prior MI followed by PCI in [place] in either 2019 or 2020.  - Patent RPLV stent on LHC 10/22. Also  had 20% distal RCA, 30% p LAD, 70% d LAD, 60% OM2 - Stent placed 2020. >1yr since stent placement, could consider stopping Plavix d/t issues with noncompliance. - Continue ASA  - Continue atorvastatin 80 mg daily.   3. HTN - Elevated. - Continue hydralizine 50mg TID and Imdur 30mg daily   4. Uncontrolled DM - Needs PCP follow up. - On Januvia. - A1c down to 7.3 from 12.   5. PVCs - PVC's 0-1 per hr (Personally reviewed)  - Denies palpitations.   6. Neuropathy - This seems to be his biggest limitor - On gabapentin. - Takes vitamin B12, states pain minimal now - Referred to Dr. Patel in Neurology. Consider Amvuttra.   7. SDOH - He has Medicaid and disability. -   Transportation is an issue, he has a car but unable to drive (needs license). - 28 year old son helps with meds. - Paramedicine follows outpatient   8. Constipation - Primary issue currently, abdomen feels bloated and tight.  Needs bowel movement.    9. 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 - Creatinine 1.23 => 1.76 this admission.  - Hold diuretic and Entresto today, restart tomorrow.  - Daily BMET   10. Hx fall - CT: L-spine on 12/08/2021 was negative for acute fracture or subluxation of the L-spine. - Fall risk precautions - per primary   Volume status much better, could go home from my standpoint (needs resolution of constipation first).  Would hold Entresto today and restart tomorrow.  Would hold diuretic today and start torsemide 20 mg daily tomorrow for home dosing (stop po Lasix).   Length of Stay: 2  Loralie Champagne, MD  12/26/2021, 10:01 AM  Advanced Heart Failure Team Pager [phone] (M-F; 7a - 5p)  Please contact [facility] for night-coverage after hours (5p -7a ) and weekends on [email or web address]

## 2021-12-26 NOTE — Progress Notes (Signed)
PROGRESS NOTE    Thomas West  UXL:[id number] DOB: 08-Jun-1960 DOA: 12/24/2021 PCP: [name], [name], MD  60/M with history of CAD, chronic systolic and diastolic CHF uncontrolled diabetes, NSVT, history of CVA, CKD, multiple hospitalizations with CHF.  7/17-7/20 admitted with heart failure, 7/22-7/25 admitted with N/V/D with AKI, hydrated, GDMT held, restarted on discharge and advised to resume Lasix after weight trended up. -Patient reports restarting Lasix 2 days after discharge however in the last 2 days noted worsening shortness of breath, dizziness chest and abdominal tightness and swelling, reports 7lb  weight gain.  -ED he was hypertensive, BNP was 2778, creatinine 1.2, chest x-ray was unremarkable   Subjective: -Complains of abdominal bloating and constipation, no BM in over 6 to 7 days, breathing better  Assessment and Plan:  Acute on chronic HFrEF (heart failure with reduced ejection fraction) (HCC) Cardiac amyloidosis -Last echo 6/23 with a EF of 30-35%, grade 1 DD, RV function was normal Presenting with progressive weight gain, DOE, BNP >2700.  -Frequent admissions for same -Diuresed with IV Lasix, clinically appears euvolemic now, hold diuretics today, cards following recommended to restart torsemide 20 Mg daily from tomorrow -Hold Entresto today -Continue Coreg, hydralazine and Imdur -Cardiology consulted -Tafamadis 61 mg daily ordered, patient was unable to bring this -Monitor BMP, daily weights  Constipation -Add Senokot and MiraLAX -Needs a screening colonoscopy, has never had one  Hypertension associated with diabetes (HCC) -Improved, meds as above  Chronic kidney disease, stage 3a (HCC) -Improved from recent baseline, monitor with diuresis  Type 2 diabetes mellitus with diabetic neuropathy, unspecified (HCC) -A1c recently was 7.3  History of CVA (cerebrovascular accident) without residual deficits Continue aspirin, Plavix, atorvastatin.  Hyperlipidemia  associated with type 2 diabetes mellitus (HCC) Continue atorvastatin.  CAD (coronary artery disease) Hx of PCI to RPAV  -Troponin minimally elevated 16 > 20.  No acute ischemic changes on EKG. -Continue Coreg and Imdur -Continue aspirin, Plavix, atorvastatin  Nausea vomiting -Resolved, CT abdomen without contrast on admission was unremarkable  DVT prophylaxis: Lovenox Code Status: Full code Family Communication: Discussed patient in detail no family at bedside Disposition Plan: Home tomorrow if stable  Consultants: Cardiology   Procedures:   Antimicrobials:    Objective: Vitals:   12/25/21 1952 12/25/21 2136 12/25/21 2356 12/26/21 0504  BP: 105/89  93/75 117/80  Pulse: 93  73 79  Resp: 20 18 20 20   Temp: 98.4 F (36.9 C)  98.4 F (36.9 C) 98.4 F (36.9 C)  TempSrc: Oral  Oral Oral  SpO2: 96%  95% 96%  Weight:    66.2 kg  Height:        Intake/Output Summary (Last 24 hours) at 12/26/2021 1012 Last data filed at 12/26/2021 0800 Gross per 24 hour  Intake 723 ml  Output 2050 ml  Net -1327 ml   Filed Weights   12/24/21 1722 12/25/21 1622 12/26/21 0504  Weight: 70.8 kg 67.9 kg 66.2 kg    Examination:  General exam: Chronically ill male sitting up in bed, AAOx3, no distress HEENT: No JVD CVS: S1-S2, regular rhythm Lungs: Decreased breath sounds to bases Abdomen: Soft, mildly distended, bowel sounds present Extremities: No edema  Skin: No rashes Psychiatry:  Mood & affect appropriate.     Data Reviewed:   CBC: Recent Labs  Lab 12/24/21 1725 12/25/21 0309  WBC 7.4 6.5  NEUTROABS 4.2  --   HGB 11.9* 12.0*  HCT 37.5* 36.2*  MCV 88.2 85.4  PLT 300 305   Basic  Metabolic Panel: Recent Labs  Lab 12/24/21 1725 12/25/21 0309 12/26/21 0754  NA 141 143 137  K 3.8 4.0 3.9  CL 110 110 103  CO2 21* 24 24  GLUCOSE 142* 134* 149*  BUN 17 15 26*  CREATININE 1.23 1.23 1.78*  CALCIUM 8.3* 8.8* 8.8*  MG  --  1.6*  --    GFR: Estimated Creatinine  Clearance: 42.4 mL/min (A) (by C-G formula based on SCr of 1.78 mg/dL (H)). Liver Function Tests: No results for input(s): "AST", "ALT", "ALKPHOS", "BILITOT", "PROT", "ALBUMIN" in the last 168 hours. No results for input(s): "LIPASE", "AMYLASE" in the last 168 hours. No results for input(s): "AMMONIA" in the last 168 hours. Coagulation Profile: No results for input(s): "INR", "PROTIME" in the last 168 hours. Cardiac Enzymes: No results for input(s): "CKTOTAL", "CKMB", "CKMBINDEX", "TROPONINI" in the last 168 hours. BNP (last 3 results) Recent Labs    02/03/21 0911  PROBNP 3,012.0*   HbA1C: No results for input(s): "HGBA1C" in the last 72 hours. CBG: Recent Labs  Lab 12/25/21 0755 12/25/21 1144 12/25/21 1653 12/25/21 2103 12/26/21 0633  GLUCAP 139* 145* 136* 244* 146*   Lipid Profile: No results for input(s): "CHOL", "HDL", "LDLCALC", "TRIG", "CHOLHDL", "LDLDIRECT" in the last 72 hours. Thyroid Function Tests: No results for input(s): "TSH", "T4TOTAL", "FREET4", "T3FREE", "THYROIDAB" in the last 72 hours. Anemia Panel: No results for input(s): "VITAMINB12", "FOLATE", "FERRITIN", "TIBC", "IRON", "RETICCTPCT" in the last 72 hours. Urine analysis:    Component Value Date/Time   COLORURINE YELLOW 11/10/2021 1937   APPEARANCEUR CLEAR 11/10/2021 1937   LABSPEC 1.012 11/10/2021 1937   PHURINE 5.0 11/10/2021 1937   GLUCOSEU NEGATIVE 11/10/2021 1937   HGBUR NEGATIVE 11/10/2021 1937   BILIRUBINUR NEGATIVE 11/10/2021 1937   BILIRUBINUR negative 01/02/2021 1312   KETONESUR NEGATIVE 11/10/2021 1937   PROTEINUR 100 (A) 11/10/2021 1937   UROBILINOGEN 0.2 01/02/2021 1312   NITRITE NEGATIVE 11/10/2021 1937   LEUKOCYTESUR NEGATIVE 11/10/2021 1937   Sepsis Labs: @LABRCNTIP (procalcitonin:4,lacticidven:4)  )No results found for this or any previous visit (from the past 240 hour(s)).   Radiology Studies: DG Chest 2 View  Result Date: 12/24/2021 CLINICAL DATA:  Shortness of breath,  chest pain EXAM: CHEST - 2 VIEW COMPARISON:  12/12/2021 FINDINGS: Transverse diameter of heart is slightly increased. There are no signs of pulmonary edema or focal pulmonary consolidation. There is no pleural effusion or pneumothorax. There is peribronchial thickening. IMPRESSION: There are no signs of pulmonary edema or focal pulmonary consolidation. Peribronchial thickening suggests bronchitis. Electronically Signed   By: 12/14/2021 M.D.   On: 12/24/2021 19:04     Scheduled Meds:  aspirin EC  81 mg Oral Daily   atorvastatin  80 mg Oral Daily   carvedilol  6.25 mg Oral BID WC   clopidogrel  75 mg Oral Daily   vitamin B-12  1,000 mcg Oral Daily   dapagliflozin propanediol  10 mg Oral Daily   enoxaparin (LOVENOX) injection  40 mg Subcutaneous Q24H   hydrALAZINE  50 mg Oral TID   insulin aspart  0-5 Units Subcutaneous QHS   insulin aspart  0-9 Units Subcutaneous TID WC   isosorbide mononitrate  30 mg Oral Daily   lidocaine  1 patch Transdermal Q24H   polyethylene glycol  17 g Oral Daily   potassium chloride  20 mEq Oral Daily   [START ON 12/27/2021] sacubitril-valsartan  0.5 tablet Oral BID   senna-docusate  1 tablet Oral BID   sodium chloride flush  3 mL Intravenous Q12H   spironolactone  25 mg Oral Daily   Tafamidis  61 mg Oral Daily   Continuous Infusions:  magnesium sulfate bolus IVPB Stopped (12/25/21 1406)     LOS: 2 days    Time spent: 56min    Domenic Polite, MD [facility]   12/26/2021, 10:12 AM

## 2021-12-26 NOTE — Plan of Care (Signed)

## 2021-12-27 DIAGNOSIS — I5023 Acute on chronic systolic (congestive) heart failure: Secondary | ICD-10-CM | POA: Diagnosis not present

## 2021-12-27 LAB — BASIC METABOLIC PANEL
Anion gap: 14 (ref 5–15)
BUN: 27 mg/dL — ABNORMAL HIGH (ref 6–20)
CO2: 25 mmol/L (ref 22–32)
Calcium: 8.9 mg/dL (ref 8.9–10.3)
Chloride: 98 mmol/L (ref 98–111)
Creatinine, Ser: 1.96 mg/dL — ABNORMAL HIGH (ref 0.61–1.24)
GFR, Estimated: 39 mL/min — ABNORMAL LOW (ref >60)
Glucose, Bld: 121 mg/dL — ABNORMAL HIGH (ref 70–99)
Potassium: 4.4 mmol/L (ref 3.5–5.1)
Sodium: 137 mmol/L (ref 135–145)

## 2021-12-27 LAB — CBC
HCT: 42.9 % (ref 39.0–52.0)
Hemoglobin: 14.3 g/dL (ref 13.0–17.0)
MCH: 28.2 pg (ref 26.0–34.0)
MCHC: 33.3 g/dL (ref 30.0–36.0)
MCV: 84.6 fL (ref 80.0–100.0)
Platelets: 352 10*3/uL (ref 150–400)
RBC: 5.07 MIL/uL (ref 4.22–5.81)
RDW: 14.8 % (ref 11.5–15.5)
WBC: 7.1 10*3/uL (ref 4.0–10.5)
nRBC: 0 % (ref 0.0–0.2)

## 2021-12-27 LAB — GLUCOSE, CAPILLARY
Glucose-Capillary: 146 mg/dL — ABNORMAL HIGH (ref 70–99)
Glucose-Capillary: 174 mg/dL — ABNORMAL HIGH (ref 70–99)
Glucose-Capillary: 183 mg/dL — ABNORMAL HIGH (ref 70–99)

## 2021-12-27 NOTE — Progress Notes (Addendum)
PROGRESS NOTE    Thomas West  BMW:[id number] DOB: 1960-11-02 DOA: 12/24/2021 PCP: [name], [name], MD  60/M with history of CAD, chronic systolic and diastolic CHF uncontrolled diabetes, NSVT, history of CVA, CKD, multiple hospitalizations with CHF.  7/17-7/20 admitted with heart failure, 7/22-7/25 admitted with N/V/D with AKI, hydrated, GDMT held, restarted on discharge and advised to resume Lasix after weight trended up. -Patient reports restarting Lasix 2 days after discharge however in the last 2 days noted worsening shortness of breath, dizziness chest and abdominal tightness and swelling, reports 7lb  weight gain.  -ED he was hypertensive, BNP was 2778, creatinine 1.2, chest x-ray was unremarkable   Subjective: Abdominal symptoms had resolved, had a BM earlier this morning breathing is fine,  Assessment and Plan:  Acute on chronic HFrEF (heart failure with reduced ejection fraction) (HCC) Cardiac amyloidosis -Last echo 6/23 with a EF of 30-35%, grade 1 DD, RV function was normal Presenting with progressive weight gain, DOE, BNP >2700.  -Frequent admissions for same -Diuresed with IV Lasix, now euvolemic, creatinine trended up to 1.98 -Holding Entresto, torsemide and Farxiga today -Continue Coreg, hydralazine and Imdur -Hope to resume diuretics tomorrow if stable -Tafamadis 61 mg daily ordered, patient was unable to bring this -BMP in a.m., avoid hypotension  Constipation -Had a BM this morning, abdominal symptoms have improved -Needs a screening colonoscopy, has never had one  Hypertension associated with diabetes (HCC) -Improved, meds as above  Chronic kidney disease, stage 3a (HCC) -Improved from recent baseline, monitor with diuresis  Type 2 diabetes mellitus with diabetic neuropathy, unspecified (HCC) -A1c recently was 7.3  History of CVA (cerebrovascular accident) without residual deficits Continue aspirin, Plavix, atorvastatin.  Hyperlipidemia associated with  type 2 diabetes mellitus (HCC) Continue atorvastatin.  CAD (coronary artery disease) Hx of PCI to RPAV  -Continue Coreg and Imdur -Continue aspirin, Plavix, atorvastatin  Nausea vomiting -Resolved, CT abdomen without contrast on admission was unremarkable  DVT prophylaxis: Lovenox Code Status: Full code Family Communication: Discussed patient in detail no family at bedside Disposition Plan: Home tomorrow if stable  Consultants: Cardiology   Procedures:   Antimicrobials:    Objective: Vitals:   12/26/21 0504 12/26/21 1718 12/26/21 2040 12/27/21 0547  BP: 117/80 134/81 117/75 125/84  Pulse: 79 81 83 72  Resp: 20 19 17 15   Temp: 98.4 F (36.9 C) 98.4 F (36.9 C) 98 F (36.7 C) 97.8 F (36.6 C)  TempSrc: Oral  Oral Oral  SpO2: 96% 99% 98% 97%  Weight: 66.2 kg   67.1 kg  Height:        Intake/Output Summary (Last 24 hours) at 12/27/2021 0931 Last data filed at 12/27/2021 02/26/2022 Gross per 24 hour  Intake 840 ml  Output 350 ml  Net 490 ml   Filed Weights   12/25/21 1622 12/26/21 0504 12/27/21 0547  Weight: 67.9 kg 66.2 kg 67.1 kg    Examination:  General exam: Chronically ill male sitting up in bed, AAOx3, no distress HEENT: No JVD CVS: S1-S2, regular rhythm Lungs: Decreased breath sounds to bases Abdomen: Soft, mildly distended, bowel sounds present Extremities: No edema  Skin: No rashes Psychiatry:  Mood & affect appropriate.     Data Reviewed:   CBC: Recent Labs  Lab 12/24/21 1725 12/25/21 0309 12/27/21 0400  WBC 7.4 6.5 7.1  NEUTROABS 4.2  --   --   HGB 11.9* 12.0* 14.3  HCT 37.5* 36.2* 42.9  MCV 88.2 85.4 84.6  PLT 300 305 352  Basic Metabolic Panel: Recent Labs  Lab 12/24/21 1725 12/25/21 0309 12/26/21 0754 12/27/21 0400  NA 141 143 137 137  K 3.8 4.0 3.9 4.4  CL 110 110 103 98  CO2 21* 24 24 25   GLUCOSE 142* 134* 149* 121*  BUN 17 15 26* 27*  CREATININE 1.23 1.23 1.78* 1.96*  CALCIUM 8.3* 8.8* 8.8* 8.9  MG  --  1.6*  --   --     GFR: Estimated Creatinine Clearance: 39 mL/min (A) (by C-G formula based on SCr of 1.96 mg/dL (H)). Liver Function Tests: No results for input(s): "AST", "ALT", "ALKPHOS", "BILITOT", "PROT", "ALBUMIN" in the last 168 hours. No results for input(s): "LIPASE", "AMYLASE" in the last 168 hours. No results for input(s): "AMMONIA" in the last 168 hours. Coagulation Profile: No results for input(s): "INR", "PROTIME" in the last 168 hours. Cardiac Enzymes: No results for input(s): "CKTOTAL", "CKMB", "CKMBINDEX", "TROPONINI" in the last 168 hours. BNP (last 3 results) Recent Labs    02/03/21 0911  PROBNP 3,012.0*   HbA1C: No results for input(s): "HGBA1C" in the last 72 hours. CBG: Recent Labs  Lab 12/26/21 0633 12/26/21 1158 12/26/21 1712 12/26/21 2103 12/27/21 0631  GLUCAP 146* 164* 113* 234* 146*   Lipid Profile: No results for input(s): "CHOL", "HDL", "LDLCALC", "TRIG", "CHOLHDL", "LDLDIRECT" in the last 72 hours. Thyroid Function Tests: No results for input(s): "TSH", "T4TOTAL", "FREET4", "T3FREE", "THYROIDAB" in the last 72 hours. Anemia Panel: No results for input(s): "VITAMINB12", "FOLATE", "FERRITIN", "TIBC", "IRON", "RETICCTPCT" in the last 72 hours. Urine analysis:    Component Value Date/Time   COLORURINE YELLOW 11/10/2021 1937   APPEARANCEUR CLEAR 11/10/2021 1937   LABSPEC 1.012 11/10/2021 1937   PHURINE 5.0 11/10/2021 1937   GLUCOSEU NEGATIVE 11/10/2021 1937   HGBUR NEGATIVE 11/10/2021 1937   BILIRUBINUR NEGATIVE 11/10/2021 1937   BILIRUBINUR negative 01/02/2021 1312   KETONESUR NEGATIVE 11/10/2021 1937   PROTEINUR 100 (A) 11/10/2021 1937   UROBILINOGEN 0.2 01/02/2021 1312   NITRITE NEGATIVE 11/10/2021 1937   LEUKOCYTESUR NEGATIVE 11/10/2021 1937   Sepsis Labs: @LABRCNTIP (procalcitonin:4,lacticidven:4)  )No results found for this or any previous visit (from the past 240 hour(s)).   Radiology Studies: No results found.   Scheduled Meds:  aspirin EC   81 mg Oral Daily   atorvastatin  80 mg Oral Daily   carvedilol  6.25 mg Oral BID WC   clopidogrel  75 mg Oral Daily   vitamin B-12  1,000 mcg Oral Daily   enoxaparin (LOVENOX) injection  40 mg Subcutaneous Q24H   hydrALAZINE  50 mg Oral TID   insulin aspart  0-5 Units Subcutaneous QHS   insulin aspart  0-9 Units Subcutaneous TID WC   isosorbide mononitrate  30 mg Oral Daily   lidocaine  1 patch Transdermal Q24H   potassium chloride  20 mEq Oral Daily   sodium chloride flush  3 mL Intravenous Q12H   spironolactone  25 mg Oral Daily   Tafamidis  61 mg Oral Daily   Continuous Infusions:     LOS: 3 days    Time spent: 11/12/2021    , MD [facility]   12/27/2021, 9:31 AM

## 2021-12-27 NOTE — Progress Notes (Signed)
Patient ID: Thomas West, male   DOB: 23-Jul-1960, 61 y.o.   MRN: [id number]     Advanced Heart Failure Rounding Note  PCP-Cardiologist: Jenkins Rouge, MD   Subjective:    No dyspnea today.   Creatinine 1.23 => 1.78 => 1.96.  Lasix and Entresto held yesterday.  BP stable.    Had BM overnight, abdominal bloating resolved.    Objective:   Weight Range: 67.1 kg Body mass index is 21.83 kg/m.   Vital Signs:   Temp:  [97.8 F (36.6 C)-98.4 F (36.9 C)] 97.8 F (36.6 C) (08/06 0547) Pulse Rate:  [72-83] 72 (08/06 0547) Resp:  [15-19] 15 (08/06 0547) BP: (117-134)/(75-84) 125/84 (08/06 0547) SpO2:  [97 %-99 %] 97 % (08/06 0547) Weight:  [67.1 kg] 67.1 kg (08/06 0547) Last BM Date : 12/26/21  Weight change: Filed Weights   12/25/21 1622 12/26/21 0504 12/27/21 0547  Weight: 67.9 kg 66.2 kg 67.1 kg    Intake/Output:   Intake/Output Summary (Last 24 hours) at 12/27/2021 0910 Last data filed at 12/27/2021 0552 Gross per 24 hour  Intake 840 ml  Output 350 ml  Net 490 ml      Physical Exam    General: NAD Neck: No JVD, no thyromegaly or thyroid nodule.  Lungs: Clear to auscultation bilaterally with normal respiratory effort. CV: Nondisplaced PMI.  Heart regular S1/S2, no S3/S4, no murmur.  No peripheral edema.   Abdomen: Soft, nontender, no hepatosplenomegaly, no distention.  Skin: Intact without lesions or rashes.  Neurologic: Alert and oriented x 3.  Psych: Normal affect. Extremities: No clubbing or cyanosis.  HEENT: Normal.   Telemetry   NSR 80s (personally reviewed)  Labs    CBC Recent Labs    12/24/21 1725 12/25/21 0309 12/27/21 0400  WBC 7.4 6.5 7.1  NEUTROABS 4.2  --   --   HGB 11.9* 12.0* 14.3  HCT 37.5* 36.2* 42.9  MCV 88.2 85.4 84.6  PLT 300 305 967   Basic Metabolic Panel Recent Labs    12/25/21 0309 12/26/21 0754 12/27/21 0400  NA 143 137 137  K 4.0 3.9 4.4  CL 110 103 98  CO2 _0 GLUCOSE 134* 149* 121*  BUN 15 26* 27*   CREATININE 1.23 1.78* 1.96*  CALCIUM 8.8* 8.8* 8.9  MG 1.6*  --   --    Liver Function Tests No results for input(s): "AST", "ALT", "ALKPHOS", "BILITOT", "PROT", "ALBUMIN" in the last 72 hours. No results for input(s): "LIPASE", "AMYLASE" in the last 72 hours. Cardiac Enzymes No results for input(s): "CKTOTAL", "CKMB", "CKMBINDEX", "TROPONINI" in the last 72 hours.  BNP: BNP (last 3 results) Recent Labs    12/07/21 1130 12/12/21 1643 12/24/21 1810  BNP 3,127.4* 147.4* 2,778.6*    ProBNP (last 3 results) Recent Labs    02/03/21 0911  PROBNP 3,012.0*     D-Dimer No results for input(s): "DDIMER" in the last 72 hours. Hemoglobin A1C No results for input(s): "HGBA1C" in the last 72 hours. Fasting Lipid Panel No results for input(s): "CHOL", "HDL", "LDLCALC", "TRIG", "CHOLHDL", "LDLDIRECT" in the last 72 hours. Thyroid Function Tests No results for input(s): "TSH", "T4TOTAL", "T3FREE", "THYROIDAB" in the last 72 hours.  Invalid input(s): "FREET3"  Other results:   Imaging    No results found.   Medications:     Scheduled Medications:  aspirin EC  81 mg Oral Daily   atorvastatin  80 mg Oral Daily   carvedilol  6.25 mg Oral BID WC  clopidogrel  75 mg Oral Daily   vitamin B-12  1,000 mcg Oral Daily   enoxaparin (LOVENOX) injection  40 mg Subcutaneous Q24H   hydrALAZINE  50 mg Oral TID   insulin aspart  0-5 Units Subcutaneous QHS   insulin aspart  0-9 Units Subcutaneous TID WC   isosorbide mononitrate  30 mg Oral Daily   lidocaine  1 patch Transdermal Q24H   potassium chloride  20 mEq Oral Daily   sodium chloride flush  3 mL Intravenous Q12H   spironolactone  25 mg Oral Daily   Tafamidis  61 mg Oral Daily    Infusions:    PRN Medications: acetaminophen **OR** acetaminophen, albuterol, cyclobenzaprine, nitroGLYCERIN, ondansetron **OR** ondansetron (ZOFRAN) IV, oxyCODONE-acetaminophen    Assessment/Plan   Chronic systolic HF/Cardiac  amyloidosis - Onset not certain. He reports cardiomyopathy diagnosed just prior to MI while living in [place] a couple of years ago. - Echo (10/22): EF 20-25%, RV ok - R/LHC (10/22): Patent RPLV stent with nonobstructive disease elsewhere, Preserved CO/CI, RA mean 5 mmHg, PCWP mean 5 mmHg - cMRI (11/22): LVEF 24% RVEF 21% LGE and ECW suggestive of cardiac amyloidosis. 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 I thought equivocal but based on cMRI likely positive Will need genetic testing. - Multiple myeloma panel (12/22) with no m-spike. - ECHO (6/23) LVEF 30-35%, LV global hypokinesis, Grade 1 DD, RV function normal - Presented with NYHA III. Was hypertensive at 189/125, K 3.8, Cr 1.23, BNP 2778.6, Trop 16>>20, CXR: (-) for focal consolidation, edema, effusion.  - Had 57m IV lasix x 2 Friday with good diuresis, REDS clip only 36% after initial Lasix.  He is not volume overloaded on exam today and creatinine higher again at 1.96. - No diuretic today, use torsemide 20 mg daily when restarted.  - Continue hydralazine 50 mg tid and Imdur 30 mg daily.  - Hold Entresto today with higher creatinine, restart tomorrow if trending down.  - Hold FWilder Gladetoday with ongoing rise in creatinine.  - Continue carvedilol 6.25 mg bid.  - Continue spironolactone 25 mg daily   - Await genetic testing for TTR cardiac amyloidosis.    - Tafamidis not brought from home & not stocked by pharmacy. States no one can bring at the moment. Restart if able to be brought in, otherwise at discharge. - Would recheck REDS clip in am.  - Would consider cardioMEMS placement for better monitoring of volume status.    2. CAD - Denies CP, chest tightness d/t fluid - [place]  - [facility] Also had 20% distal RCA, 30% p LAD, 70% d LAD, 60% OM2 - Stent placed 2020. >151yrince stent placement, could consider stopping Plavix d/t issues with noncompliance. - Continue  ASA  - Continue atorvastatin 80 mg daily.   3. HTN - Elevated. - Continue hydralizine 5042mID and Imdur 55m34mily   4. Uncontrolled DM - Needs PCP follow up. - On Januvia. - A1c down to 7.3 from 12.   5. PVCs - PVC's 0-1 per hr (Personally reviewed)  - Denies palpitations.   6. Neuropathy - This seems to be his biggest limitor - On gabapentin. - Takes vitamin B12, states pain minimal now - Referred to Dr. [facility]. Consider Amvuttra.   7. SDOH - He has Medicaid and disability. - Transportation is an issue, he has a car but unable to drive (needs license). -  13 year old son helps with meds. - Paramedicine follows outpatient   8. Constipation - Primary issue currently, abdomen feels bloated and tight.  Needs bowel movement.    9. 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 - Creatinine 1.23 => 1.78 => 1.96 this admission.  - Hold diuretic and Entresto today, restart tomorrow.  - Daily BMET   10. Hx fall - CT: L-spine on 12/08/2021 was negative for acute fracture or subluxation of the L-spine. - Fall risk precautions - per primary   Volume status looks ok (actually did not get much IV diuretic).  Would continue to hold Entresto, Farxiga, and loop today with rise in creatinine again.  Check REDS clip in am, hopefully can restart meds tomorrow.  Watch today.   Length of Stay: 3  Dalton McLean, MD  12/27/2021, 9:10 AM  Advanced Heart Failure Team Pager [phone] (M-F; 7a - 5p)  Please contact [facility] for night-coverage after hours (5p -7a ) and weekends on [email or web address]   

## 2021-12-27 NOTE — Progress Notes (Signed)
Nurse gave patient 120 ounces of warm prune juice this morning to aid with bowel movement. Pt reports no BM in 7 days.

## 2021-12-28 ENCOUNTER — Ambulatory Visit: Payer: Medicare Other | Admitting: Diagnostic Neuroimaging

## 2021-12-28 DIAGNOSIS — I5023 Acute on chronic systolic (congestive) heart failure: Secondary | ICD-10-CM | POA: Diagnosis not present

## 2021-12-28 LAB — BASIC METABOLIC PANEL
Anion gap: 7 (ref 5–15)
BUN: 33 mg/dL — ABNORMAL HIGH (ref 6–20)
CO2: 25 mmol/L (ref 22–32)
Calcium: 8.6 mg/dL — ABNORMAL LOW (ref 8.9–10.3)
Chloride: 105 mmol/L (ref 98–111)
Creatinine, Ser: 2.07 mg/dL — ABNORMAL HIGH (ref 0.61–1.24)
GFR, Estimated: 36 mL/min — ABNORMAL LOW (ref >60)
Glucose, Bld: 128 mg/dL — ABNORMAL HIGH (ref 70–99)
Potassium: 5 mmol/L (ref 3.5–5.1)
Sodium: 137 mmol/L (ref 135–145)

## 2021-12-28 LAB — GLUCOSE, CAPILLARY
Glucose-Capillary: 143 mg/dL — ABNORMAL HIGH (ref 70–99)
Glucose-Capillary: 152 mg/dL — ABNORMAL HIGH (ref 70–99)
Glucose-Capillary: 182 mg/dL — ABNORMAL HIGH (ref 70–99)
Glucose-Capillary: 196 mg/dL — ABNORMAL HIGH (ref 70–99)

## 2021-12-28 NOTE — Progress Notes (Signed)
REDS VEST READING= 35% CHEST RULER=27.5 in Station: C

## 2021-12-28 NOTE — Consult Note (Signed)
   [facility] Inpatient Consult   12/28/2021  Macarthur Lorusso 04/04/1961 [id number]  [facility] [ACO] Patient: Thomas West  Primary Care Provider:  [name], [name], MD, [place] [facility], is an embedded provider with a Chronic Care Management team and program, and is listed for the transition of care follow up and appointments.  Patient was screened for readmission less than 30 days with extreme high risk scores for unplanned readmission.   Embedded practice service for chronic care management/care coordination needs as discussed in unit progression meeting.  Notes patient is being followed by the Adv HF team and per inpatient [facility] RNCM notes with Paramedicine team.  11:50 am Met with the patient at the bedside, patient confirms PCP, and he states he is followed by Paramedicine twice a week, he states he still drives, his sister assist him with medical management as well.  He is currently resting in bed states, Trying to get some rest." Left a reminder card at the bedside for post hospital TOC follow up with 24 hour nurse advise line information. Patient states no other needs at this time. He is alright with having post hospital follow up with his PCP.  Plan: A referral  for care coordination can be sent sent to the Embedded Care Management and made aware of  any TOC needs for post hospital needs. Continue to follow and referral closer to post hospital transition.  Please contact for further questions,  Natividad Brood, RN BSN [facility] Liaison  [phone] business mobile phone Toll free office [phone]  Fax number: [phone] [email or web address]

## 2021-12-28 NOTE — Progress Notes (Signed)
PROGRESS NOTE    Thomas West  PVX:[id number] DOB: 05/27/1960 DOA: 12/24/2021 PCP: [name], [name], MD  60/M with history of CAD, chronic systolic and diastolic CHF uncontrolled diabetes, NSVT, history of CVA, CKD, multiple hospitalizations with CHF.  7/17-7/20 admitted with heart failure, 7/22-7/25 admitted with N/V/D with AKI, hydrated, GDMT held, restarted on discharge and advised to resume Lasix after weight trended up. -Patient reports restarting Lasix 2 days after discharge however in the last 2 days noted worsening shortness of breath, dizziness chest and abdominal tightness and swelling, reports 7lb  weight gain.  -ED he was hypertensive, BNP was 2778, creatinine 1.2, chest x-ray was unremarkable   Subjective: Had 2-3 BMs overnight after laxatives, abdomen feels better, tired, did not sleep last night, breathing okay  Assessment and Plan:  Acute on chronic HFrEF (heart failure with reduced ejection fraction) (HCC) Cardiac amyloidosis -Last echo 6/23 with a EF of 30-35%, grade 1 DD, RV function was normal Presenting with progressive weight gain, DOE, BNP >2700.  -Frequent admissions for same -Diuresed with IV Lasix, now euvolemic, creatinine has gradually trended up to 2.07 -Holding Entresto, torsemide and Farxiga again today -Continue Coreg, hydralazine and Imdur -Can hopefully resume diuretics tomorrow if creatinine improving -Tafamadis 61 mg daily ordered, patient was unable to bring this -BMP in a.m., avoid hypotension  Constipation -Had multiple BMs overnight after laxatives, abdominal symptoms have improved -Needs a screening colonoscopy, has never had one -CT abdomen pelvis few weeks ago was unremarkable  Hypertension associated with diabetes (HCC) -Improved, meds as above  Chronic kidney disease, stage 3a (HCC) -Improved from recent baseline, monitor with diuresis  Type 2 diabetes mellitus with diabetic neuropathy, unspecified (HCC) -A1c recently was  7.3  History of CVA (cerebrovascular accident) without residual deficits Continue aspirin, Plavix, atorvastatin.  Hyperlipidemia associated with type 2 diabetes mellitus (HCC) Continue atorvastatin.  CAD (coronary artery disease) Hx of PCI to RPAV  -Continue Coreg and Imdur -Continue aspirin, Plavix, atorvastatin  Nausea vomiting -Resolved, CT abdomen without contrast on admission was unremarkable  DVT prophylaxis: Lovenox Code Status: Full code Family Communication: Discussed patient in detail no family at bedside Disposition Plan: Home tomorrow if stable  Consultants: Cardiology   Procedures:   Antimicrobials:    Objective: Vitals:   12/27/21 1558 12/27/21 1957 12/28/21 0522 12/28/21 0745  BP: 100/69 115/71 (!) 148/85 (!) 156/81  Pulse: 80 88 79 74  Resp: 19 20 17 18   Temp:  98 F (36.7 C) 98.1 F (36.7 C) 98.1 F (36.7 C)  TempSrc: Oral Oral Oral Oral  SpO2: 97% 97%  98%  Weight:   67.2 kg   Height:        Intake/Output Summary (Last 24 hours) at 12/28/2021 1154 Last data filed at 12/28/2021 0747 Gross per 24 hour  Intake 600 ml  Output 1200 ml  Net -600 ml   Filed Weights   12/26/21 0504 12/27/21 0547 12/28/21 0522  Weight: 66.2 kg 67.1 kg 67.2 kg    Examination:  General exam: Chronically ill male sitting up in bed, AAOx3, no distress HEENT: No JVD CVS: S1-S2, regular rhythm Lungs: Decreased breath sounds bases Abdomen: Soft, nontender, less distended, bowel sounds present Extremities: No edema  Skin: No rashes Psychiatry:  Mood & affect appropriate.     Data Reviewed:   CBC: Recent Labs  Lab 12/24/21 1725 12/25/21 0309 12/27/21 0400  WBC 7.4 6.5 7.1  NEUTROABS 4.2  --   --   HGB 11.9* 12.0* 14.3  HCT  37.5* 36.2* 42.9  MCV 88.2 85.4 84.6  PLT 300 305 352   Basic Metabolic Panel: Recent Labs  Lab 12/24/21 1725 12/25/21 0309 12/26/21 0754 12/27/21 0400 12/28/21 0412  NA 141 143 137 137 137  K 3.8 4.0 3.9 4.4 5.0  CL 110 110  103 98 105  CO2 21* 24 24 25 25   GLUCOSE 142* 134* 149* 121* 128*  BUN 17 15 26* 27* 33*  CREATININE 1.23 1.23 1.78* 1.96* 2.07*  CALCIUM 8.3* 8.8* 8.8* 8.9 8.6*  MG  --  1.6*  --   --   --    GFR: Estimated Creatinine Clearance: 37 mL/min (A) (by C-G formula based on SCr of 2.07 mg/dL (H)). Liver Function Tests: No results for input(s): "AST", "ALT", "ALKPHOS", "BILITOT", "PROT", "ALBUMIN" in the last 168 hours. No results for input(s): "LIPASE", "AMYLASE" in the last 168 hours. No results for input(s): "AMMONIA" in the last 168 hours. Coagulation Profile: No results for input(s): "INR", "PROTIME" in the last 168 hours. Cardiac Enzymes: No results for input(s): "CKTOTAL", "CKMB", "CKMBINDEX", "TROPONINI" in the last 168 hours. BNP (last 3 results) Recent Labs    02/03/21 0911  PROBNP 3,012.0*   HbA1C: No results for input(s): "HGBA1C" in the last 72 hours. CBG: Recent Labs  Lab 12/27/21 0631 12/27/21 1605 12/27/21 2207 12/28/21 0618 12/28/21 1116  GLUCAP 146* 174* 183* 143* 152*   Lipid Profile: No results for input(s): "CHOL", "HDL", "LDLCALC", "TRIG", "CHOLHDL", "LDLDIRECT" in the last 72 hours. Thyroid Function Tests: No results for input(s): "TSH", "T4TOTAL", "FREET4", "T3FREE", "THYROIDAB" in the last 72 hours. Anemia Panel: No results for input(s): "VITAMINB12", "FOLATE", "FERRITIN", "TIBC", "IRON", "RETICCTPCT" in the last 72 hours. Urine analysis:    Component Value Date/Time   COLORURINE YELLOW 11/10/2021 1937   APPEARANCEUR CLEAR 11/10/2021 1937   LABSPEC 1.012 11/10/2021 1937   PHURINE 5.0 11/10/2021 1937   GLUCOSEU NEGATIVE 11/10/2021 1937   HGBUR NEGATIVE 11/10/2021 1937   BILIRUBINUR NEGATIVE 11/10/2021 1937   BILIRUBINUR negative 01/02/2021 1312   KETONESUR NEGATIVE 11/10/2021 1937   PROTEINUR 100 (A) 11/10/2021 1937   UROBILINOGEN 0.2 01/02/2021 1312   NITRITE NEGATIVE 11/10/2021 1937   LEUKOCYTESUR NEGATIVE 11/10/2021 1937   Sepsis  Labs: @LABRCNTIP (procalcitonin:4,lacticidven:4)  )No results found for this or any previous visit (from the past 240 hour(s)).   Radiology Studies: No results found.   Scheduled Meds:  aspirin EC  81 mg Oral Daily   atorvastatin  80 mg Oral Daily   carvedilol  6.25 mg Oral BID WC   clopidogrel  75 mg Oral Daily   vitamin B-12  1,000 mcg Oral Daily   enoxaparin (LOVENOX) injection  40 mg Subcutaneous Q24H   hydrALAZINE  50 mg Oral TID   insulin aspart  0-5 Units Subcutaneous QHS   insulin aspart  0-9 Units Subcutaneous TID WC   isosorbide mononitrate  30 mg Oral Daily   lidocaine  1 patch Transdermal Q24H   sodium chloride flush  3 mL Intravenous Q12H   spironolactone  25 mg Oral Daily   Continuous Infusions:   LOS: 4 days    Time spent: 11/12/2021  , MD [facility]   12/28/2021, 11:54 AM

## 2021-12-28 NOTE — Progress Notes (Addendum)
Patient ID: Thomas West, male   DOB: January 20, 1961, 61 y.o.   MRN: [id number]     Advanced Heart Failure Rounding Note  PCP-Cardiologist: Jenkins Rouge, MD   Subjective:   Lasix held over the weekend. Entresto held yesterday.  Creatinine 1.23 => 1.78 => 1.96 => 2.07  ReDS clip 35% today   In bed, didn't get much rest d/t getting up to go to the bathroom multiple times. Denies CP and SOB.   Objective:   Weight Range: 67.2 kg Body mass index is 21.88 kg/m.   Vital Signs:   Temp:  [97.7 F (36.5 C)-98.1 F (36.7 C)] 98.1 F (36.7 C) (08/07 0522) Pulse Rate:  [79-88] 79 (08/07 0522) Resp:  [17-20] 17 (08/07 0522) BP: (100-148)/(69-88) 148/85 (08/07 0522) SpO2:  [97 %-100 %] 97 % (08/06 1957) Weight:  [67.2 kg] 67.2 kg (08/07 0522) Last BM Date : 12/27/21  Weight change: Filed Weights   12/26/21 0504 12/27/21 0547 12/28/21 0522  Weight: 66.2 kg 67.1 kg 67.2 kg    Intake/Output:   Intake/Output Summary (Last 24 hours) at 12/28/2021 0704 Last data filed at 12/28/2021 0526 Gross per 24 hour  Intake 480 ml  Output 1200 ml  Net -720 ml     Physical Exam    General:  chronically ill appearing. No respiratory difficulty HEENT: normal Neck: supple. JVD ~8 cm. Carotids 2+ bilat; no bruits. No lymphadenopathy or thyromegaly appreciated. Cor: PMI nondisplaced. Regular rate & rhythm. No rubs, gallops or murmurs. Lungs: clear, diminished at bases Abdomen: soft, nontender, nondistended. No hepatosplenomegaly. No bruits or masses. Good bowel sounds. Extremities: no cyanosis, clubbing, rash, edema  Neuro: alert & oriented x 3, cranial nerves grossly intact. moves all 4 extremities w/o difficulty. Affect pleasant.   Telemetry   NSR 80s (personally reviewed)  Labs    CBC Recent Labs    12/27/21 0400  WBC 7.1  HGB 14.3  HCT 42.9  MCV 84.6  PLT 989   Basic Metabolic Panel Recent Labs    12/27/21 0400 12/28/21 0412  NA 137 137  K 4.4 5.0  CL 98 105  CO2 25 25   GLUCOSE 121* 128*  BUN 27* 33*  CREATININE 1.96* 2.07*  CALCIUM 8.9 8.6*   Liver Function Tests No results for input(s): "AST", "ALT", "ALKPHOS", "BILITOT", "PROT", "ALBUMIN" in the last 72 hours. No results for input(s): "LIPASE", "AMYLASE" in the last 72 hours. Cardiac Enzymes No results for input(s): "CKTOTAL", "CKMB", "CKMBINDEX", "TROPONINI" in the last 72 hours.  BNP: BNP (last 3 results) Recent Labs    12/07/21 1130 12/12/21 1643 12/24/21 1810  BNP 3,127.4* 147.4* 2,778.6*    ProBNP (last 3 results) Recent Labs    02/03/21 0911  PROBNP 3,012.0*     D-Dimer No results for input(s): "DDIMER" in the last 72 hours. Hemoglobin A1C No results for input(s): "HGBA1C" in the last 72 hours. Fasting Lipid Panel No results for input(s): "CHOL", "HDL", "LDLCALC", "TRIG", "CHOLHDL", "LDLDIRECT" in the last 72 hours. Thyroid Function Tests No results for input(s): "TSH", "T4TOTAL", "T3FREE", "THYROIDAB" in the last 72 hours.  Invalid input(s): "FREET3"  Other results:   Imaging    No results found.   Medications:     Scheduled Medications:  aspirin EC  81 mg Oral Daily   atorvastatin  80 mg Oral Daily   carvedilol  6.25 mg Oral BID WC   clopidogrel  75 mg Oral Daily   vitamin B-12  1,000 mcg Oral Daily   enoxaparin (  LOVENOX) injection  40 mg Subcutaneous Q24H   hydrALAZINE  50 mg Oral TID   insulin aspart  0-5 Units Subcutaneous QHS   insulin aspart  0-9 Units Subcutaneous TID WC   isosorbide mononitrate  30 mg Oral Daily   lidocaine  1 patch Transdermal Q24H   potassium chloride  20 mEq Oral Daily   sodium chloride flush  3 mL Intravenous Q12H   spironolactone  25 mg Oral Daily    Infusions:    PRN Medications: acetaminophen **OR** acetaminophen, albuterol, cyclobenzaprine, nitroGLYCERIN, ondansetron **OR** ondansetron (ZOFRAN) IV, oxyCODONE-acetaminophen    Assessment/Plan   Chronic systolic HF/Cardiac amyloidosis - Onset not certain. He  reports cardiomyopathy diagnosed just prior to MI while living in [place] a couple of years ago. - Echo (10/22): EF 20-25%, RV ok - R/LHC (10/22): Patent RPLV stent with nonobstructive disease elsewhere, Preserved CO/CI, RA mean 5 mmHg, PCWP mean 5 mmHg - cMRI (11/22): LVEF 24% RVEF 21% LGE and ECW suggestive of cardiac amyloidosis. 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 I thought equivocal but based on cMRI likely positive Will need genetic testing. - Multiple myeloma panel (12/22) with no m-spike. - ECHO (6/23) LVEF 30-35%, LV global hypokinesis, Grade 1 DD, RV function normal - Presented with NYHA III. Was hypertensive at 189/125, K 3.8, Cr 1.23, BNP 2778.6, Trop 16>>20, CXR: (-) for focal consolidation, edema, effusion.  - Had 1m IV lasix x 2 Friday with good diuresis, REDS clip only 36% after initial Lasix.  He is not volume overloaded on exam today and creatinine higher again at 2.07. Still having good UOP per patient. -1.2L - No diuretic today, use torsemide 20 mg daily when restarted.  - Continue hydralazine 50 mg tid and Imdur 30 mg daily.  - Hold Entresto today with higher creatinine  - Hold Farxiga today with ongoing rise in creatinine.  - Continue carvedilol 6.25 mg bid.  - Continue spironolactone 25 mg daily   - Stop potassium chloride 219m daily. K 5.0 today - Await genetic testing for TTR cardiac amyloidosis.    - Tafamidis not brought from home & not stocked by pharmacy. States no one can bring at the moment. Restart if able to be brought in, otherwise at discharge. - ReDS clip today 35% - Would consider cardioMEMS placement for better monitoring of volume status.    2. CAD - Denies CP, chest tightness d/t fluid - [place].  - [facility]. Also had 20% distal RCA, 30% p LAD, 70% d LAD, 60% OM2 - Stent placed 2020. >1y39yrnce stent placement, could consider stopping Plavix d/t issues with noncompliance. -  Continue ASA  - Continue atorvastatin 80 mg daily.   3. HTN - Elevated. - Continue hydralizine 100m39mD and Imdur 30mg58mly   4. Uncontrolled DM - Needs PCP follow up. - On Januvia. - A1c down to 7.3 from 12.   5. PVCs - PVC's 0-1 per hr (Personally reviewed)  - Denies palpitations.   6. Neuropathy - This seems to be his biggest limitor - On gabapentin. - Takes vitamin B12, states pain minimal now - Referred to Dr. PatelPosey Pront[facility]. Consider Amvuttra.   7. SDOH - He has Medicaid and disability. - Transportation is an issue, he has a car but unable to drive (needs license). - 13 ye28 old son helps with meds. - Paramedicine follows outpatient   8. Constipation - Primary  issue currently, abdomen feels bloated and tight.  Needs bowel movement.    9. 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 - Creatinine 1.23 => 1.78 => 1.96 =>2.07 this admission.  - Continue to hold diuretic and Entresto today - Daily BMET   10. Hx fall - CT: L-spine on 12/08/2021 was negative for acute fracture or subluxation of the L-spine. - Fall risk precautions - per primary   Length of Stay: [address] AGACNP-BC 12/28/2021, 7:04 AM  Advanced Heart Failure Team Pager [phone] (M-F; 7a - 5p)  Please contact [facility] for night-coverage after hours (5p -7a ) and weekends on [email or web address]  Patient seen and examined with the above-signed Advanced Practice Provider and/or Housestaff. I personally reviewed laboratory data, imaging studies and relevant notes. I independently examined the patient and formulated the important aspects of the plan. I have edited the note to reflect any of my changes or salient points. I have personally discussed the plan with the patient and/or family.  Continues with good urine output. Denies CP or SOB. Weight stable of diuretics. Volume status looks good ReDS 35% (normal lung water).   Scr up slightly  General:  Sitting up  No resp  difficulty HEENT: normal Neck: supple. no JVD. Carotids 2+ bilat; no bruits. No lymphadenopathy or thryomegaly appreciated. Cor: PMI nondisplaced. Regular rate & rhythm. No rubs, gallops or murmurs. Lungs: clear Abdomen: soft, nontender, nondistended. No hepatosplenomegaly. No bruits or masses. Good bowel sounds. Extremities: no cyanosis, clubbing, rash, edema Neuro: alert & orientedx3, cranial nerves grossly intact. moves all 4 extremities w/o difficulty. Affect pleasant  Would continue to hold [place], [place] and torsemide at least one more day.   Scr only up slightly. Would be ok to d/c today from my standpoint with outpatient f/u on Scr but ok if primary wants to hold another day. At d/c would resume [place]. Use torsemide 45m MWF and PRN. May need cardiomems.   DGlori Bickers MD  9:03 AM

## 2021-12-29 ENCOUNTER — Other Ambulatory Visit (HOSPITAL_COMMUNITY): Payer: Self-pay

## 2021-12-29 DIAGNOSIS — I5023 Acute on chronic systolic (congestive) heart failure: Secondary | ICD-10-CM | POA: Diagnosis not present

## 2021-12-29 LAB — BASIC METABOLIC PANEL
Anion gap: 6 (ref 5–15)
BUN: 30 mg/dL — ABNORMAL HIGH (ref 6–20)
CO2: 29 mmol/L (ref 22–32)
Calcium: 8.9 mg/dL (ref 8.9–10.3)
Chloride: 104 mmol/L (ref 98–111)
Creatinine, Ser: 1.77 mg/dL — ABNORMAL HIGH (ref 0.61–1.24)
GFR, Estimated: 44 mL/min — ABNORMAL LOW (ref >60)
Glucose, Bld: 124 mg/dL — ABNORMAL HIGH (ref 70–99)
Potassium: 5 mmol/L (ref 3.5–5.1)
Sodium: 139 mmol/L (ref 135–145)

## 2021-12-29 LAB — GLUCOSE, CAPILLARY
Glucose-Capillary: 142 mg/dL — ABNORMAL HIGH (ref 70–99)
Glucose-Capillary: 147 mg/dL — ABNORMAL HIGH (ref 70–99)

## 2021-12-29 MED ORDER — SENNOSIDES-DOCUSATE SODIUM 8.6-50 MG PO TABS
1.0000 | ORAL_TABLET | Freq: Every day | ORAL | 0 refills | Status: DC
  Filled 2021-12-29: qty 20, 20d supply, fill #0

## 2021-12-29 MED ORDER — TORSEMIDE 20 MG PO TABS
20.0000 mg | ORAL_TABLET | ORAL | 0 refills | Status: DC
  Filled 2021-12-29: qty 30, 70d supply, fill #0

## 2021-12-29 MED ORDER — HYDRALAZINE HCL 50 MG PO TABS: 50.0000 mg | ORAL_TABLET | Freq: Three times a day (TID) | ORAL | Status: DC

## 2021-12-29 MED ORDER — EMPAGLIFLOZIN 10 MG PO TABS
10.0000 mg | ORAL_TABLET | Freq: Every day | ORAL | 0 refills | Status: DC
  Filled 2021-12-29: qty 30, 30d supply, fill #0

## 2021-12-29 MED ORDER — HYDRALAZINE HCL 50 MG PO TABS: 75.0000 mg | ORAL_TABLET | Freq: Three times a day (TID) | ORAL | Status: DC

## 2021-12-29 MED ORDER — SACUBITRIL-VALSARTAN 24-26 MG PO TABS
1.0000 | ORAL_TABLET | Freq: Two times a day (BID) | ORAL | 0 refills | Status: DC
  Filled 2021-12-29: qty 60, 30d supply, fill #0

## 2021-12-29 MED ORDER — SACUBITRIL-VALSARTAN 24-26 MG PO TABS
1.0000 | ORAL_TABLET | Freq: Two times a day (BID) | ORAL | Status: DC
  Administered 2021-12-29: 1 via ORAL
  Filled 2021-12-29: qty 1

## 2021-12-29 MED ORDER — DAPAGLIFLOZIN PROPANEDIOL 10 MG PO TABS
10.0000 mg | ORAL_TABLET | Freq: Every day | ORAL | Status: DC
Start: 2021-12-29 — End: 2021-12-29
  Administered 2021-12-29: 10 mg via ORAL
  Filled 2021-12-29: qty 1

## 2021-12-29 NOTE — Progress Notes (Signed)
Patient discharged with cab voucher. SWAT nurse completed all discharge paperwork. Education was completed by primary nurse. Medications were given to patient after SWAT nurse retrieved them from pharmacy.  Pt denies pain.

## 2021-12-29 NOTE — Progress Notes (Addendum)
   12/29/21 1000  Mobility  Activity Ambulated with assistance in hallway  Level of [place] wheel walker  Distance Ambulated (ft) 550 ft  Activity Response Tolerated well  $Mobility charge 1 Mobility   Mobility Specialist Progress Note  Pt was in bed and agreeable. Left in bed w/all needs met & call bell in reach.  Lucious Groves Mobility Specialist

## 2021-12-29 NOTE — Progress Notes (Addendum)
Patient ID: Thomas West, male   DOB: 1961-05-19, 61 y.o.   MRN: [id number]     Advanced Heart Failure Rounding Note  PCP-Cardiologist: Jenkins Rouge, MD   Subjective:   Lasix held over the weekend. Entresto held 8/6.  Creatinine 1.23 => 1.78 => 1.96 => 2.07=> 1.77  ReDS clip 35% yesterday   In bed, complaining of abdominal pain 2/2 constipation. Denies CP and SOB.   Objective:   Weight Range: 67.6 kg Body mass index is 22.01 kg/m.   Vital Signs:   Temp:  [98 F (36.7 C)-98.5 F (36.9 C)] 98 F (36.7 C) (08/08 0748) Pulse Rate:  [75-85] 75 (08/08 0748) Resp:  [19-20] 19 (08/08 0748) BP: (130-181)/(70-93) 181/93 (08/08 0748) SpO2:  [96 %-99 %] 99 % (08/08 0748) Weight:  [67.6 kg] 67.6 kg (08/08 0344) Last BM Date : 12/27/21  Weight change: Filed Weights   12/27/21 0547 12/28/21 0522 12/29/21 0344  Weight: 67.1 kg 67.2 kg 67.6 kg    Intake/Output:   Intake/Output Summary (Last 24 hours) at 12/29/2021 0811 Last data filed at 12/29/2021 0346 Gross per 24 hour  Intake 480 ml  Output 1700 ml  Net -1220 ml     Physical Exam  General:  chronically ill appearing. Looks stated age. No respiratory difficulty HEENT: normal Neck: supple. JVD ~8 cm. Carotids 2+ bilat; no bruits. No lymphadenopathy or thyromegaly appreciated. Cor: PMI nondisplaced. Regular rate & rhythm. No rubs, gallops or murmurs. Lungs: clear Abdomen: soft, nontender, nondistended. No hepatosplenomegaly. No bruits or masses. Active bowel sounds. Extremities: no cyanosis, clubbing, rash, edema  Neuro: alert & oriented x 3, cranial nerves grossly intact. moves all 4 extremities w/o difficulty. Affect pleasant.   Telemetry   NSR 80s (personally reviewed)  Labs    CBC Recent Labs    12/27/21 0400  WBC 7.1  HGB 14.3  HCT 42.9  MCV 84.6  PLT 720   Basic Metabolic Panel Recent Labs    12/27/21 0400 12/28/21 0412  NA 137 137  K 4.4 5.0  CL 98 105  CO2 25 25  GLUCOSE 121* 128*  BUN 27* 33*   CREATININE 1.96* 2.07*  CALCIUM 8.9 8.6*   Liver Function Tests No results for input(s): "AST", "ALT", "ALKPHOS", "BILITOT", "PROT", "ALBUMIN" in the last 72 hours. No results for input(s): "LIPASE", "AMYLASE" in the last 72 hours. Cardiac Enzymes No results for input(s): "CKTOTAL", "CKMB", "CKMBINDEX", "TROPONINI" in the last 72 hours.  BNP: BNP (last 3 results) Recent Labs    12/07/21 1130 12/12/21 1643 12/24/21 1810  BNP 3,127.4* 147.4* 2,778.6*    ProBNP (last 3 results) Recent Labs    02/03/21 0911  PROBNP 3,012.0*     D-Dimer No results for input(s): "DDIMER" in the last 72 hours. Hemoglobin A1C No results for input(s): "HGBA1C" in the last 72 hours. Fasting Lipid Panel No results for input(s): "CHOL", "HDL", "LDLCALC", "TRIG", "CHOLHDL", "LDLDIRECT" in the last 72 hours. Thyroid Function Tests No results for input(s): "TSH", "T4TOTAL", "T3FREE", "THYROIDAB" in the last 72 hours.  Invalid input(s): "FREET3"  Other results:   Imaging    No results found.   Medications:     Scheduled Medications:  aspirin EC  81 mg Oral Daily   atorvastatin  80 mg Oral Daily   carvedilol  6.25 mg Oral BID WC   clopidogrel  75 mg Oral Daily   vitamin B-12  1,000 mcg Oral Daily   enoxaparin (LOVENOX) injection  40 mg Subcutaneous Q24H  hydrALAZINE  50 mg Oral TID   insulin aspart  0-5 Units Subcutaneous QHS   insulin aspart  0-9 Units Subcutaneous TID WC   isosorbide mononitrate  30 mg Oral Daily   lidocaine  1 patch Transdermal Q24H   sodium chloride flush  3 mL Intravenous Q12H   spironolactone  25 mg Oral Daily    Infusions:    PRN Medications: acetaminophen **OR** acetaminophen, albuterol, nitroGLYCERIN, ondansetron **OR** ondansetron (ZOFRAN) IV, oxyCODONE-acetaminophen    Assessment/Plan   Chronic systolic HF/Cardiac amyloidosis - Onset not certain. He reports cardiomyopathy diagnosed just prior to MI while living in [place] a couple of years ago. -  Echo (10/22): EF 20-25%, RV ok - R/LHC (10/22): Patent RPLV stent with nonobstructive disease elsewhere, Preserved CO/CI, RA mean 5 mmHg, PCWP mean 5 mmHg - cMRI (11/22): LVEF 24% RVEF 21% LGE and ECW suggestive of cardiac amyloidosis. 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 I thought equivocal but based on cMRI likely positive Will need genetic testing. - Multiple myeloma panel (12/22) with no m-spike. - ECHO (6/23) LVEF 30-35%, LV global hypokinesis, Grade 1 DD, RV function normal - Presented with NYHA III. Was hypertensive at 189/125, K 3.8, Cr 1.23, BNP 2778.6, Trop 16>>20, CXR: (-) for focal consolidation, edema, effusion.  - Had 40m IV lasix x 2 Friday with good diuresis, REDS clip only 36% after initial Lasix.  He is not volume overloaded on exam today and creatinine higher again at 2.07. Still having decent UOP per patient. -1.1L - No diuretic today, use torsemide 214mMWF and PRN at discharge - Continue hydralazine 50 mg tid and cont Imdur 30 mg daily.  - Restart Entresto today at 24/26 mg BID - Restart Farxiga 1066maily - Continue carvedilol 6.25 mg bid.  - Continue spironolactone 25 mg daily   - potassium chloride 7m25maily stopped yesterday. K 5 today - Await genetic testing for TTR cardiac amyloidosis.    - Tafamidis not brought from home & not stocked by pharmacy. States no one can bring at the moment. Restart if able to be brought in, otherwise at discharge. - ReDS clip yesterday 35% - Would consider cardioMEMS placement for better monitoring of volume status.   2. CAD - Denies CP, chest tightness d/t fluid - [place]  - Patent RPLV stent on [facility]. Also had 20% distal RCA, 30% p LAD, 70% d LAD, 60% OM2 - Stent placed 2020. >56yr 22yre stent placement, could consider stopping Plavix d/t issues with noncompliance. - Continue ASA  - Continue atorvastatin 80 mg daily.   3. HTN - Elevated.  - Continue hydralizine 50mg 34m and cont Imdur 30mg d72m   4. Uncontrolled DM - Needs PCP follow up. - On Januvia. - A1c down to 7.3 from 12.   5. PVCs - PVC's 0-1 per hr (Personally reviewed)  - Denies palpitations.   6. Neuropathy - On gabapentin. - Takes vitamin B12, states pain minimal now - Referred to Dr. Patel iPosey Prontor[facility]. Consider Amvuttra.   7. SDOH - He has Medicaid and disability. - Transportation is an issue, he has a car but unable to drive (needs license). - 13 year68ld son helps with meds. - Paramedicine follows outpatient   8. Constipation - Primary issue currently, abdomen feels bloated, soft to touch.  Needs bowel movement. Had resolved 8/6 but today woke up with the same symptoms again - Asked RN to give  him prune juice as this helped him 2 days ago. Encouraged ambulation with assistance.    9. 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 - Creatinine 1.23 => 1.78 => 1.96 =>2.07 peaked this admission.  - 1.77 today  - Continue to hold diuretic today, restart entresto - Daily BMET   10. Hx fall - CT: L-spine on 12/08/2021 was negative for acute fracture or subluxation of the L-spine. - Fall risk precautions - per primary   Ok to discharge from AHF standpoint. Follow-up 8/14 scheduled. BMET then. Paramedicine will continue to follow once discharged.  AHF meds: ASA 55m daily Atorvastatin 830mdaily Carvedilol 6.2551mID Clopidogrel 31m45mily Entresto 24-26 mgBID Farxiga 10mg68mly Hydralizine 50mg 31mImdur 30mg d2m Spironolactone 25mg da2mTorsemide 20mg MWF72mn take extra as needed)   Length of Stay: 5  Alma LWaukauBC 12/29/2021, 8:11 AM  Advanced Heart Failure Team Pager [phone] - 5p)  Please contact [facility] for night-coverage after hours (5p -7a ) and weekends on [email or web address]  Patient seen and examined with the above-signed Advanced Practice Provider and/or Housestaff. I personally reviewed laboratory data, imaging studies and  relevant notes. I independently examined the patient and formulated the important aspects of the plan. I have edited the note to reflect any of my changes or salient points. I have personally discussed the plan with the patient and/or family.  Renal function improved. Feels better. Denies CP or SOB. Wants to go home  General:  Well appearing. No resp difficulty HEENT: normal Neck: supple. no JVD. Carotids 2+ bilat; no bruits. No lymphadenopathy or thryomegaly appreciated. Cor: PMI nondisplaced. Regular rate & rhythm. No rubs, gallops or murmurs. Lungs: clear Abdomen: soft, nontender, nondistended. No hepatosplenomegaly. No bruits or masses. Good bowel sounds. Extremities: no cyanosis, clubbing, rash, edema Neuro: alert & orientedx3, cranial nerves grossly intact. moves all 4 extremities w/o difficulty. Affect pleasant  Ok for d/c on meds above (reviewed with NP and PharmD). Will need very close Paramedicine f/u (we discussed with them as well).   Ashby Moskal BeGlori [email or web address] AM

## 2021-12-29 NOTE — Discharge Summary (Signed)
Physician Discharge Summary  Thomas West VVO:[id number] DOB: 1961-01-21 DOA: 12/24/2021  PCP: [name], [name], MD  Admit date: 12/24/2021 Discharge date: 12/29/2021  Time spent: 35 minutes  Recommendations for Outpatient Follow-up:  Advanced heart failure clinic 8/14, please check BMP at follow-up PCP Dr. [name] 8/9 Home health RN, para medicine GI referral sent for screening colonoscopy, constipation   Discharge Diagnoses:  Principal Problem:   Acute on chronic HFrEF (heart failure with reduced ejection fraction) (HCC) Cardiac amyloidosis   Hypertension associated with diabetes (HCC)   Chronic kidney disease, stage 3a (HCC)   Type 2 diabetes mellitus with diabetic neuropathy, unspecified (HCC)   CAD (coronary artery disease)   Hyperlipidemia associated with type 2 diabetes mellitus (HCC)   History of CVA (cerebrovascular accident) without residual deficits   Discharge Condition: Stable  Diet recommendation: Diabetic, low-sodium heart healthy  Filed Weights   12/27/21 0547 12/28/21 0522 12/29/21 0344  Weight: 67.1 kg 67.2 kg 67.6 kg    History of present illness:  60/M with history of CAD, chronic systolic and diastolic CHF uncontrolled diabetes, NSVT, history of CVA, CKD, multiple hospitalizations with CHF.  7/17-7/20 admitted with heart failure, 7/22-7/25 admitted with N/V/D with AKI, hydrated, GDMT held, restarted on discharge and advised to resume Lasix after weight trended up. -Patient reports restarting Lasix 2 days after discharge however in the last 2 days noted worsening shortness of breath, dizziness chest and abdominal tightness and swelling, reports 7lb  weight gain.  -ED he was hypertensive, BNP was 2778, creatinine 1.2, chest x-ray was unremarkable  Hospital Course:   Acute on chronic HFrEF (heart failure with reduced ejection fraction) (HCC) Cardiac amyloidosis -Last echo 6/23 with a EF of 30-35%, grade 1 DD, RV function was normal Presenting with progressive  weight gain, DOE, BNP >2700.  -Frequent admissions for same -Diuresed with IV Lasix, now euvolemic, creatinine then trended up to 2.07, now improving, 1.7 at discharge -Held Askov and torsemide for 2 days, now resuming Entresto at low-dose with Marcelline Deist and torsemide Monday Wednesday Friday and as needed for weight gain -Continue Coreg, hydralazine and Imdur -Tafamadis 61 mg daily for amyloidosis to be continued -Discharged home in a stable condition, he has a follow-up with his PCP in 2 days, heart failure clinic follow-up next week, home health RN and para medicine   Constipation -Had multiple BMs early yesterday after laxatives, abdominal symptoms have improved, no BM this morning -Needs a screening colonoscopy, has never had one -CT abdomen pelvis few weeks ago was unremarkable -Add Senokot nightly to home meds   Hypertension associated with diabetes (HCC) -Improved, meds as above   Aki on Chronic kidney disease, stage 3a (HCC) -now worsening, see discussion above   Type 2 diabetes mellitus with diabetic neuropathy, unspecified (HCC) -A1c recently was 7.3   History of CVA (cerebrovascular accident) without residual deficits Continue aspirin, Plavix, atorvastatin.   Hyperlipidemia associated with type 2 diabetes mellitus (HCC) Continue atorvastatin.   CAD (coronary artery disease) Hx of PCI to RPAV  -Continue Coreg and Imdur -Continue aspirin, Plavix, atorvastatin   Nausea vomiting -On admission, resolved, CT abdomen without contrast on admission was unremarkable  Discharge Exam: Vitals:   12/29/21 0748 12/29/21 1201  BP: (!) 181/93 (!) 140/85  Pulse: 75 81  Resp: 19 (!) 21  Temp: 98 F (36.7 C) 97.7 F (36.5 C)  SpO2: 99% 97%   General exam: Chronically ill male sitting up in bed, AAOx3, no distress HEENT: No JVD CVS: S1-S2, regular  rhythm Lungs: Decreased breath sounds bases Abdomen: Soft, nontender, less distended, bowel sounds present Extremities:  No edema  Skin: No rashes Psychiatry:  Mood & affect appropriate.      Discharge Instructions   Discharge Instructions     Ambulatory referral to Gastroenterology   Complete by: As directed    Chronic constipation, needs screening colonoscopy   What is the reason for referral?: Colonoscopy   Diet - low sodium heart healthy   Complete by: As directed    Increase activity slowly   Complete by: As directed       Allergies as of 12/29/2021       Reactions   Bee Venom Anaphylaxis, Swelling, Other (See Comments)   Swells all over        Medication List     STOP taking these medications    Entresto 97-103 MG Generic drug: sacubitril-valsartan Replaced by: Delene Loll 24-26 MG   furosemide 40 MG tablet Commonly known as: Lasix       TAKE these medications    Aspirin Low Dose 81 MG tablet Generic drug: aspirin EC Take 1 tablet (81 mg total) by mouth daily. Swallow whole.   atorvastatin 80 MG tablet Commonly known as: LIPITOR Take 1 tablet (80 mg total) by mouth daily.   carvedilol 6.25 MG tablet Commonly known as: COREG Take 1 tablet (6.25 mg total) by mouth EVERY 12 HOURS (10AM &10PM)   clopidogrel 75 MG tablet Commonly known as: PLAVIX Take 75 mg by mouth daily.   cyanocobalamin 1000 MCG tablet Commonly known as: VITAMIN B12 Take 1 tablet (1,000 mcg total) by mouth daily.   Entresto 24-26 MG Generic drug: sacubitril-valsartan Take 1 tablet by mouth 2 (two) times daily. Replaces: Entresto 97-103 MG   gabapentin 400 MG capsule Commonly known as: NEURONTIN Take 400 mg by mouth daily.   hydrALAZINE 50 MG tablet Commonly known as: APRESOLINE Take 1 tablet (50 mg total) by mouth 3 (three) times daily.   hydrocortisone cream 1 % Apply to affected area 2 times daily What changed:  how much to take how to take this when to take this additional instructions   isosorbide mononitrate 30 MG 24 hr tablet Commonly known as: IMDUR Take 1 tablet (30 mg  total) by mouth daily.   Jardiance 10 MG Tabs tablet Generic drug: empagliflozin Take 1 tablet (10 mg total) by mouth daily.   Senexon-S 8.6-50 MG tablet Generic drug: senna-docusate Take 1 tablet by mouth at bedtime.   spironolactone 25 MG tablet Commonly known as: ALDACTONE Take 1 tablet (25 mg total) by mouth daily.   torsemide 20 MG tablet Commonly known as: DEMADEX Take 1 tablet (20 mg total) by mouth every Monday, Wednesday, and Friday. Take 1 tab extra for weight gain, 2-3lbs overnight and 5lbs in 1 week Start taking on: December 30, 2021   Vyndamax 61 MG Caps Generic drug: Tafamidis Take 1 capsule by mouth daily.       Allergies  Allergen Reactions   Bee Venom Anaphylaxis, Swelling and Other (See Comments)    Swells all over    Follow-up Information     [facility] Follow up.   Why: Home Health RN and Physical Therapy-agency will call with appt Contact information: [place] Follow up on 01/04/2022.   Specialty: Cardiology Why: Follow up 01/04/22 at 0930am. Please bring all medications. [facility]. Contact information:  [address] [id number] [place]        [name], [name], MD. Go on 12/30/2021.   Specialty: Family Medicine Why: @11 :30am Contact information: [place] [id number] [phone]                  The results of significant diagnostics from this hospitalization (including imaging, microbiology, ancillary and laboratory) are listed below for reference.    Significant Diagnostic Studies: DG Chest 2 View  Result Date: 12/24/2021 CLINICAL DATA:  Shortness of breath, chest pain EXAM: CHEST - 2 VIEW COMPARISON:  12/12/2021 FINDINGS: Transverse diameter of heart is slightly increased. There are no signs of pulmonary edema or focal pulmonary consolidation. There is no  pleural effusion or pneumothorax. There is peribronchial thickening. IMPRESSION: There are no signs of pulmonary edema or focal pulmonary consolidation. Peribronchial thickening suggests bronchitis. Electronically Signed   By: Elmer Picker M.D.   On: 12/24/2021 19:04   CT ABDOMEN PELVIS WO CONTRAST  Result Date: 12/13/2021 CLINICAL DATA:  Abdominal pain EXAM: CT ABDOMEN AND PELVIS WITHOUT CONTRAST TECHNIQUE: Multidetector CT imaging of the abdomen and pelvis was performed following the standard protocol without IV contrast. RADIATION DOSE REDUCTION: This exam was performed according to the departmental dose-optimization program which includes automated exposure control, adjustment of the mA and/or kV according to patient size and/or use of iterative reconstruction technique. COMPARISON:  None Available. FINDINGS: Lower chest: Lung bases are clear. Hepatobiliary: Unenhanced liver is unremarkable. Gallbladder is unremarkable. No intrahepatic or extrahepatic duct dilatation. Pancreas: Within normal limits. Spleen: Within normal limits. Adrenals/Urinary Tract: 14 mm right adrenal nodule (series 3/image 11), likely reflecting a benign adrenal adenoma. No follow-up is recommended. Left adrenal gland is within normal limits. Kidneys are within normal limits. No renal calculi or hydronephrosis. Mildly thick-walled bladder, although underdistended. Stomach/Bowel: Stomach is within normal limits. No evidence of bowel obstruction. Normal appendix (series 3/image 47). No colonic wall thickening or inflammatory changes. Vascular/Lymphatic: No evidence of abdominal aortic aneurysm. Atherosclerotic calcifications of the abdominal aorta and branch vessels. No suspicious abdominopelvic lymphadenopathy. Reproductive: Prostate is notable for enlargement of the central gland, suggesting BPH. Other: No abdominopelvic ascites. Tiny fat containing left inguinal hernia (series 3/image 64). Musculoskeletal: Visualized osseous  structures are within normal limits. IMPRESSION: No CT findings to account for the patient's abdominal pain. Electronically Signed   By: Julian Hy M.D.   On: 12/13/2021 01:10   DG Chest 2 View  Result Date: 12/12/2021 CLINICAL DATA:  Shortness of breath EXAM: CHEST - 2 VIEW COMPARISON:  12/07/2021 FINDINGS: Normal heart size and pulmonary vascularity. No focal airspace disease or consolidation in the lungs. No blunting of costophrenic angles. No pneumothorax. Mediastinal contours appear intact. Calcification of the aorta. IMPRESSION: No active cardiopulmonary disease. Electronically Signed   By: Lucienne Capers M.D.   On: 12/12/2021 17:43   CT Head Wo Contrast  Result Date: 12/08/2021 CLINICAL DATA:  Trauma. EXAM: CT HEAD WITHOUT CONTRAST TECHNIQUE: Contiguous axial images were obtained from the base of the skull through the vertex without intravenous contrast. RADIATION DOSE REDUCTION: This exam was performed according to the departmental dose-optimization program which includes automated exposure control, adjustment of the mA and/or kV according to patient size and/or use of iterative reconstruction technique. COMPARISON:  Head CT dated 03/12/2021. FINDINGS: Brain: Mild age-related atrophy and chronic microvascular ischemic changes. There is no acute intracranial hemorrhage. No mass effect or midline shift. No extra-axial fluid collection. Small calcified meningioma  along the left tentorium measures approximately 6 mm. Vascular: No hyperdense vessel or unexpected calcification. Skull: Normal. Negative for fracture or focal lesion. Sinuses/Orbits: Diffuse mucoperiosteal thickening of paranasal sinuses with partial opacification of the right maxillary sinus. The mastoid air cells are clear. Other: None IMPRESSION: 1. No acute intracranial pathology. 2. Mild age-related atrophy and chronic microvascular ischemic changes. 3. Small calcified meningioma along the left tentorium. Electronically Signed    By: Anner Crete M.D.   On: 12/08/2021 03:20   CT Lumbar Spine Wo Contrast  Result Date: 12/08/2021 CLINICAL DATA:  Fall EXAM: CT LUMBAR SPINE WITHOUT CONTRAST TECHNIQUE: Multidetector CT imaging of the lumbar spine was performed without intravenous contrast administration. Multiplanar CT image reconstructions were also generated. RADIATION DOSE REDUCTION: This exam was performed according to the departmental dose-optimization program which includes automated exposure control, adjustment of the mA and/or kV according to patient size and/or use of iterative reconstruction technique. COMPARISON:  None Available. FINDINGS: Segmentation: 5 lumbar type vertebrae. Alignment: Normal. Vertebrae: No acute fracture or focal pathologic process. Paraspinal and other soft tissues: Calcific aortic atherosclerosis. Disc levels: No spinal canal stenosis. IMPRESSION: 1. No acute fracture or static subluxation of the lumbar spine. 2. Aortic Atherosclerosis (ICD10-I70.0). Electronically Signed   By: Ulyses Jarred M.D.   On: 12/08/2021 03:18   DG Chest 1 View  Result Date: 12/07/2021 CLINICAL DATA:  Fall with chest pain EXAM: CHEST  1 VIEW COMPARISON:  11/10/2021 FINDINGS: Cardiomegaly. Central pulmonary arteries appear enlarged. No pleural effusion or pneumothorax. IMPRESSION: 1. Mild cardiomegaly. 2. Prominent central pulmonary arteries suggestive of arterial hypertension Electronically Signed   By: Donavan Foil M.D.   On: 12/07/2021 21:03   DG Lumbar Spine Complete  Result Date: 12/07/2021 CLINICAL DATA:  Fall EXAM: LUMBAR SPINE - COMPLETE 4+ VIEW COMPARISON:  CT 08/03/2021 FINDINGS: Alignment within normal limits. Vertebral body heights are maintained. The disc spaces are patent. Aortic atherosclerosis. IMPRESSION: No acute osseous abnormality Electronically Signed   By: Donavan Foil M.D.   On: 12/07/2021 21:03    Microbiology: No results found for this or any previous visit (from the past 240 hour(s)).    Labs: Basic Metabolic Panel: Recent Labs  Lab 12/25/21 0309 12/26/21 0754 12/27/21 0400 12/28/21 0412 12/29/21 0719  NA 143 137 137 137 139  K 4.0 3.9 4.4 5.0 5.0  CL 110 103 98 105 104  CO2 24 24 25 25 29   GLUCOSE 134* 149* 121* 128* 124*  BUN 15 26* 27* 33* 30*  CREATININE 1.23 1.78* 1.96* 2.07* 1.77*  CALCIUM 8.8* 8.8* 8.9 8.6* 8.9  MG 1.6*  --   --   --   --    Liver Function Tests: No results for input(s): "AST", "ALT", "ALKPHOS", "BILITOT", "PROT", "ALBUMIN" in the last 168 hours. No results for input(s): "LIPASE", "AMYLASE" in the last 168 hours. No results for input(s): "AMMONIA" in the last 168 hours. CBC: Recent Labs  Lab 12/24/21 1725 12/25/21 0309 12/27/21 0400  WBC 7.4 6.5 7.1  NEUTROABS 4.2  --   --   HGB 11.9* 12.0* 14.3  HCT 37.5* 36.2* 42.9  MCV 88.2 85.4 84.6  PLT 300 305 352   Cardiac Enzymes: No results for input(s): "CKTOTAL", "CKMB", "CKMBINDEX", "TROPONINI" in the last 168 hours. BNP: BNP (last 3 results) Recent Labs    12/07/21 1130 12/12/21 1643 12/24/21 1810  BNP 3,127.4* 147.4* 2,778.6*    ProBNP (last 3 results) Recent Labs    02/03/21 0911  PROBNP 3,012.0*  CBG: Recent Labs  Lab 12/28/21 1116 12/28/21 1630 12/28/21 2122 12/29/21 0558 12/29/21 1148  GLUCAP 152* 182* 196* 142* 147*       Signed:  Zannie Cove MD.  [facility] 12/29/2021, 12:21 PM

## 2021-12-29 NOTE — Consult Note (Signed)
   [facility] Inpatient Consult   12/29/2021  Amil Bouwman 1960-12-12 [id number]  [facility] [[facility]]  Accountable Care Organization [ACO] Patient: Thomas West  Patient had transitioned home  Collaboration /care coordination with inpatient AHF team RNCM regarding patient's issues with transportation needs.  Patient is asserting that he drives however, a deeper review is that patient has a vehicle but no license to drive.  He had been given information regarding follow up with [facility] customer service for possible benefits there. Call attempt to patient without success stating "voicemail has not been set up." Unable to leave a message.  Charlesetta Shanks, RN BSN CCM [facility]  [phone] business mobile phone Toll free office [phone]  Fax number: [phone] [email or web address]

## 2021-12-29 NOTE — Progress Notes (Deleted)
HPI:  Thomas West is a 60 y.o. male, who is here today to follow on recent hospital visit. Hospitalized on 12/24/21 and discharged home yesterday (12/29/21 Presented to the ED c/o worsening SOB, nausea,and vomiting.  Prior hospitalizations x 2 in 11/2021 for acute CHF exacerbation and AKI. HFrEF: Echo 10/2021 LVEF 30-35%. Lab Results  Component Value Date   CREATININE 1.77 (H) 12/29/2021   BUN 30 (H) 12/29/2021   NA 139 12/29/2021   K 5.0 12/29/2021   CL 104 12/29/2021   CO2 29 12/29/2021   Lab Results  Component Value Date   WBC 7.1 12/27/2021   HGB 14.3 12/27/2021   HCT 42.9 12/27/2021   MCV 84.6 12/27/2021   PLT 352 12/27/2021     Review of Systems Rest see pertinent positives and negatives per HPI.  Current Outpatient Medications on File Prior to Visit  Medication Sig Dispense Refill   aspirin EC 81 MG tablet Take 1 tablet (81 mg total) by mouth daily. Swallow whole. 30 tablet 12   atorvastatin (LIPITOR) 80 MG tablet Take 1 tablet (80 mg total) by mouth daily. 30 tablet 6   carvedilol (COREG) 6.25 MG tablet Take 1 tablet (6.25 mg total) by mouth EVERY 12 HOURS (10AM &10PM) 60 tablet 6   clopidogrel (PLAVIX) 75 MG tablet Take 75 mg by mouth daily.     cyanocobalamin 1000 MCG tablet Take 1 tablet (1,000 mcg total) by mouth daily. 30 tablet 0   empagliflozin (JARDIANCE) 10 MG TABS tablet Take 1 tablet (10 mg total) by mouth daily. 30 tablet 0   gabapentin (NEURONTIN) 400 MG capsule Take 400 mg by mouth daily.     hydrALAZINE (APRESOLINE) 50 MG tablet Take 1 tablet (50 mg total) by mouth 3 (three) times daily. 90 tablet 6   hydrocortisone cream 1 % Apply to affected area 2 times daily (Patient taking differently: Apply 1 Application topically See admin instructions. Apply to affected areas of the back 3 times a day) 28 g 0   isosorbide mononitrate (IMDUR) 30 MG 24 hr tablet Take 1 tablet (30 mg total) by mouth daily. 30 tablet 6   sacubitril-valsartan (ENTRESTO) 24-26  MG Take 1 tablet by mouth 2 (two) times daily. 60 tablet 0   senna-docusate (SENOKOT-S) 8.6-50 MG tablet Take 1 tablet by mouth at bedtime. 20 tablet 0   spironolactone (ALDACTONE) 25 MG tablet Take 1 tablet (25 mg total) by mouth daily. 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 30 capsule 11   [START ON 12/30/2021] torsemide (DEMADEX) 20 MG tablet Take 1 tablet (20 mg total) by mouth every Monday, Wednesday, and Friday. Take 1 tab extra for weight gain, 2-3lbs overnight and 5lbs in 1 week 30 tablet 0   Current Facility-Administered Medications on File Prior to Visit  Medication Dose Route Frequency Provider Last Rate Last Admin   acetaminophen (TYLENOL) tablet 650 mg  650 mg Oral Q6H PRN Charlsie Quest, MD       Or   acetaminophen (TYLENOL) suppository 650 mg  650 mg Rectal Q6H PRN Charlsie Quest, MD       albuterol (PROVENTIL) (2.5 MG/3ML) 0.083% nebulizer solution 2.5 mg  2.5 mg Nebulization Q4H PRN Lodema Hong A, RPH       aspirin EC tablet 81 mg  81 mg Oral Daily Darreld Mclean R, MD   81 mg at 12/29/21 0854   atorvastatin (LIPITOR) tablet 80 mg  80  mg Oral Daily Charlsie Quest, MD   80 mg at 12/29/21 0854   carvedilol (COREG) tablet 6.25 mg  6.25 mg Oral BID WC Darreld Mclean R, MD   6.25 mg at 12/29/21 8299   clopidogrel (PLAVIX) tablet 75 mg  75 mg Oral Daily Charlsie Quest, MD   75 mg at 12/29/21 3716   cyanocobalamin (VITAMIN B12) tablet 1,000 mcg  1,000 mcg Oral Daily Zannie Cove, MD   1,000 mcg at 12/29/21 9678   dapagliflozin propanediol (FARXIGA) tablet 10 mg  10 mg Oral Daily Brynda Peon L, NP   10 mg at 12/29/21 1005   enoxaparin (LOVENOX) injection 40 mg  40 mg Subcutaneous Q24H Charlsie Quest, MD   40 mg at 12/24/21 2247   hydrALAZINE (APRESOLINE) tablet 50 mg  50 mg Oral TID Alen Bleacher, NP       insulin aspart (novoLOG) injection 0-5 Units  0-5 Units Subcutaneous QHS Charlsie Quest, MD   2 Units at 12/26/21 2133   insulin aspart (novoLOG)  injection 0-9 Units  0-9 Units Subcutaneous TID WC Charlsie Quest, MD   1 Units at 12/29/21 1209   isosorbide mononitrate (IMDUR) 24 hr tablet 30 mg  30 mg Oral Daily Darreld Mclean R, MD   30 mg at 12/29/21 0854   lidocaine (LIDODERM) 5 % 1 patch  1 patch Transdermal Q24H Fayrene Helper, PA-C   1 patch at 12/28/21 2124   nitroGLYCERIN (NITROSTAT) SL tablet 0.4 mg  0.4 mg Sublingual Q5 min PRN Fayrene Helper, PA-C       ondansetron [facility]) tablet 4 mg  4 mg Oral Q6H PRN Charlsie Quest, MD       Or   ondansetron (ZOFRAN) injection 4 mg  4 mg Intravenous Q6H PRN Charlsie Quest, MD       oxyCODONE-acetaminophen (PERCOCET/ROXICET) 5-325 MG per tablet 1 tablet  1 tablet Oral Q6H PRN Zannie Cove, MD   1 tablet at 12/28/21 2124   sacubitril-valsartan (ENTRESTO) 24-26 mg per tablet  1 tablet Oral BID Alen Bleacher, NP   1 tablet at 12/29/21 1005   sodium chloride flush (NS) 0.9 % injection 3 mL  3 mL Intravenous Q12H Darreld Mclean R, MD   3 mL at 12/29/21 0854   spironolactone (ALDACTONE) tablet 25 mg  25 mg Oral Daily Brynda Peon L, NP   25 mg at 12/29/21 9381    Past Medical History:  Diagnosis Date   CHF (congestive heart failure) (HCC)    Coronary artery disease    Diabetes mellitus without complication (HCC)    History of MI (myocardial infarction) 03/05/2019   Hypertension    Stroke (HCC)    Allergies  Allergen Reactions   Bee Venom Anaphylaxis, Swelling and Other (See Comments)    Swells all over    Social History   Socioeconomic History   Marital status: Single    Spouse name: Not on file   Number of children: Not on file   Years of education: Not on file   Highest education level: Not on file  Occupational History   Not on file  Tobacco Use   Smoking status: Former    Types: Cigarettes    Quit date: 10/06/2017    Years since quitting: 4.2   Smokeless tobacco: Never  Substance and Sexual Activity   Alcohol use: Not Currently   Drug use: Yes    Types: Marijuana   Sexual  activity: Not on  file  Other Topics Concern   Not on file  Social History Narrative   Not on file   Social Determinants of Health   Financial Resource Strain: Low Risk  (03/10/2021)   Overall Financial Resource Strain (CARDIA)    Difficulty of Paying Living Expenses: Not very hard  Food Insecurity: Food Insecurity Present (12/07/2021)   Hunger Vital Sign    Worried About Running Out of Food in the Last Year: Sometimes true    Ran Out of Food in the Last Year: Never true  Transportation Needs: Unmet Transportation Needs (08/25/2021)   PRAPARE - Administrator, Civil Service (Medical): Yes    Lack of Transportation (Non-Medical): Yes  Physical Activity: Not on file  Stress: Not on file  Social Connections: Not on file    There were no vitals filed for this visit. There is no height or weight on file to calculate BMI.  Physical Exam  ASSESSMENT AND PLAN:   There are no diagnoses linked to this encounter.  No orders of the defined types were placed in this encounter.   No problem-specific Assessment & Plan notes found for this encounter.   No follow-ups on file.   [name]. [name], MD  [facility]. [place] office.

## 2021-12-29 NOTE — Care Management Important Message (Signed)
Important Message  Patient Details  Name: Thomas West MRN: [id number] Date of Birth: Feb 06, 1961   Medicare Important Message Given:  Yes     Renie Ora 12/29/2021, 9:52 AM

## 2021-12-29 NOTE — Care Management Important Message (Signed)
Important Message  Patient Details  Name: Thomas West MRN: [id number] Date of Birth: [birth year]   Medicare Important Message Given:  Yes     Renie Ora 12/29/2021, 11:15 AM

## 2021-12-30 ENCOUNTER — Other Ambulatory Visit: Payer: Self-pay

## 2021-12-30 ENCOUNTER — Other Ambulatory Visit (HOSPITAL_COMMUNITY): Payer: Self-pay

## 2021-12-30 ENCOUNTER — Other Ambulatory Visit (HOSPITAL_COMMUNITY): Payer: Self-pay | Admitting: Emergency Medicine

## 2021-12-30 ENCOUNTER — Inpatient Hospital Stay: Payer: Medicare Other | Admitting: Family Medicine

## 2021-12-30 DIAGNOSIS — E1140 Type 2 diabetes mellitus with diabetic neuropathy, unspecified: Secondary | ICD-10-CM

## 2021-12-30 DIAGNOSIS — I5020 Unspecified systolic (congestive) heart failure: Secondary | ICD-10-CM

## 2021-12-30 DIAGNOSIS — N189 Chronic kidney disease, unspecified: Secondary | ICD-10-CM | POA: Diagnosis not present

## 2021-12-30 DIAGNOSIS — W19XXXD Unspecified fall, subsequent encounter: Secondary | ICD-10-CM | POA: Diagnosis not present

## 2021-12-30 DIAGNOSIS — I2510 Atherosclerotic heart disease of native coronary artery without angina pectoris: Secondary | ICD-10-CM | POA: Diagnosis not present

## 2021-12-30 DIAGNOSIS — N182 Chronic kidney disease, stage 2 (mild): Secondary | ICD-10-CM

## 2021-12-30 DIAGNOSIS — Z9181 History of falling: Secondary | ICD-10-CM | POA: Diagnosis not present

## 2021-12-30 DIAGNOSIS — E1122 Type 2 diabetes mellitus with diabetic chronic kidney disease: Secondary | ICD-10-CM | POA: Diagnosis not present

## 2021-12-30 DIAGNOSIS — E1165 Type 2 diabetes mellitus with hyperglycemia: Secondary | ICD-10-CM | POA: Diagnosis not present

## 2021-12-30 DIAGNOSIS — I130 Hypertensive heart and chronic kidney disease with heart failure and stage 1 through stage 4 chronic kidney disease, or unspecified chronic kidney disease: Secondary | ICD-10-CM | POA: Diagnosis not present

## 2021-12-30 DIAGNOSIS — I5023 Acute on chronic systolic (congestive) heart failure: Secondary | ICD-10-CM | POA: Diagnosis not present

## 2021-12-30 DIAGNOSIS — Z87891 Personal history of nicotine dependence: Secondary | ICD-10-CM | POA: Diagnosis not present

## 2021-12-30 DIAGNOSIS — E8582 Wild-type transthyretin-related (ATTR) amyloidosis: Secondary | ICD-10-CM | POA: Diagnosis not present

## 2021-12-30 DIAGNOSIS — Z7982 Long term (current) use of aspirin: Secondary | ICD-10-CM | POA: Diagnosis not present

## 2021-12-30 DIAGNOSIS — Z91128 Patient's intentional underdosing of medication regimen for other reason: Secondary | ICD-10-CM | POA: Diagnosis not present

## 2021-12-30 DIAGNOSIS — Z8673 Personal history of transient ischemic attack (TIA), and cerebral infarction without residual deficits: Secondary | ICD-10-CM | POA: Diagnosis not present

## 2021-12-30 DIAGNOSIS — I252 Old myocardial infarction: Secondary | ICD-10-CM | POA: Diagnosis not present

## 2021-12-30 NOTE — Progress Notes (Signed)
Paramedicine Encounter    Patient ID: Thomas West, male    DOB: 12-Mar-1961, 62 y.o.   MRN: [id number]   There were no vitals taken for this visit. Weight yesterday-not taken Last visit weight-156lb  Arrived at residence and pt was not home, is son states he was at his neighbor's house so he went to get him.  Thomas West, CPhT did a home visit today and reconciled his pill box this morning.  I discussed the changes to his Entresto dose to 24-26mg  BID.  Also He is to take Torsemide 20 mg. Mon/Wed/Fri and to take 1 xtra tablet for weight gain.  Patient understands instructions.  Provided pt with a nutritional brochure discussing food choices and how to read labels for sodium content.  He has purchased an Psychologist, sport and exercise and we discussed salt alternatives/Mrs. Dash spices.  Pt states he is feeling great.  I emphasized to goal of keeping him on track with his care plan in order to keep him out of the hospital. Thomas West denies chest pain or SOB.  No edema noted.  Lung sounds clear throughout.  Home visit complete.  Patient Care Team: Thomas Stade, MD as PCP - Cardiology (Cardiology)  Patient Active Problem List   Diagnosis Date Noted   History of CVA (cerebrovascular accident) without residual deficits 12/24/2021   AKI (acute kidney injury) (HCC) 12/13/2021   Diarrhea 12/13/2021   Shortness of breath 12/13/2021   Chest pain 12/13/2021   Nausea and vomiting 12/13/2021   Heart failure (HCC) 12/08/2021   Hypertension associated with diabetes (HCC) 07/12/2021   Chronic kidney disease, stage 3a (HCC) 07/12/2021   Hyperkalemia 07/12/2021   CHF exacerbation (HCC) 04/20/2021   Acute on chronic combined systolic and diastolic CHF (congestive heart failure) (HCC) 03/09/2021   Acute on chronic HFrEF (heart failure with reduced ejection fraction) (HCC) 03/08/2021   HFrEF (heart failure with reduced ejection fraction) (HCC) 02/03/2021   Diabetes mellitus (HCC) 03/27/2019   Hyperlipidemia associated with  type 2 diabetes mellitus (HCC) 03/27/2019   CAD (coronary artery disease) 03/05/2019   History of MI (myocardial infarction) 03/05/2019   Type 2 diabetes mellitus with diabetic neuropathy, unspecified (HCC) 03/05/2019   CKD (chronic kidney disease), stage II 03/05/2019   HLD (hyperlipidemia) 03/05/2019   GERD (gastroesophageal reflux disease) 03/05/2019   Hypertension, essential, benign 03/05/2019    Current Outpatient Medications:    aspirin EC 81 MG tablet, Take 1 tablet (81 mg total) by mouth daily. Swallow whole., Disp: 30 tablet, Rfl: 12   atorvastatin (LIPITOR) 80 MG tablet, Take 1 tablet (80 mg total) by mouth daily., Disp: 30 tablet, Rfl: 6   carvedilol (COREG) 6.25 MG tablet, Take 1 tablet (6.25 mg total) by mouth EVERY 12 HOURS (10AM &10PM), Disp: 60 tablet, Rfl: 6   clopidogrel (PLAVIX) 75 MG tablet, Take 75 mg by mouth daily., Disp: , Rfl:    cyanocobalamin 1000 MCG tablet, Take 1 tablet (1,000 mcg total) by mouth daily., Disp: 30 tablet, Rfl: 0   empagliflozin (JARDIANCE) 10 MG TABS tablet, Take 1 tablet (10 mg total) by mouth daily., Disp: 30 tablet, Rfl: 0   gabapentin (NEURONTIN) 400 MG capsule, Take 400 mg by mouth daily., Disp: , Rfl:    hydrALAZINE (APRESOLINE) 50 MG tablet, Take 1 tablet (50 mg total) by mouth 3 (three) times daily., Disp: 90 tablet, Rfl: 6   hydrocortisone cream 1 %, Apply to affected area 2 times daily (Patient taking differently: Apply 1 Application topically See  admin instructions. 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 Take 1 tab extra for weight gain, 2-3lbs overnight and 5lbs in 1 week, Disp: 30 tablet, Rfl: 0 Allergies  Allergen Reactions   Bee Venom Anaphylaxis, Swelling and Other (See Comments)    Swells all over      Social History   Socioeconomic History   Marital status: Single    Spouse name: Not on file   Number of children: Not on file   Years of education: Not on file   Highest education level: Not on file  Occupational History   Not on file  Tobacco Use   Smoking status: Former    Types: Cigarettes    Quit date: 10/06/2017    Years since quitting: 4.2   Smokeless tobacco: Never  Substance and Sexual Activity   Alcohol use: Not Currently   Drug use: Yes    Types: Marijuana   Sexual activity: Not on file  Other Topics Concern   Not on file  Social History Narrative   Not on file   Social Determinants of Health   Financial Resource Strain: Low Risk  (03/10/2021)   Overall Financial Resource Strain (CARDIA)    Difficulty of Paying Living Expenses: Not very hard  Food Insecurity: Food Insecurity Present (12/07/2021)   Hunger Vital Sign    Worried About Running Out of Food in the Last Year: Sometimes true    Ran Out of Food in the Last Year: Never true  Transportation Needs: Unmet Transportation Needs (08/25/2021)   PRAPARE - Administrator, Civil Service (Medical): Yes    Lack of Transportation (Non-Medical): Yes  Physical Activity: Not on file  Stress: Not on file  Social Connections: Not on file  Intimate Partner Violence: Not on file    Physical Exam      Future Appointments  Date Time Provider Department Center  01/04/2022  9:30 AM [facility] PA/NP [facility] None       Beatrix Shipper,  EMT-Paramedic [phone] [facility] Paramedic  12/30/21

## 2021-12-30 NOTE — Patient Outreach (Signed)
  Care Coordination [facility] Med Note Transition Care Management Unsuccessful Follow-up Telephone Call  Date of discharge and from where:  12/29/21 [facility]  Attempts:  1st Attempt  Reason for unsuccessful TCM follow-up call:  Unable to leave message Dudley Major RN, [facility], CDE Care Management Coordinator [facility] Care Management [phone]

## 2021-12-31 ENCOUNTER — Other Ambulatory Visit: Payer: Self-pay

## 2021-12-31 ENCOUNTER — Telehealth: Payer: Self-pay

## 2021-12-31 ENCOUNTER — Telehealth (HOSPITAL_COMMUNITY): Payer: Self-pay

## 2021-12-31 DIAGNOSIS — I509 Heart failure, unspecified: Secondary | ICD-10-CM

## 2021-12-31 NOTE — Patient Outreach (Signed)
  Care Coordination [facility] Note Transition Care Management Follow-up Telephone Call Date of discharge and from where: 12/29/21 [facility] How have you been since you were released from the hospital? Doing OK Any questions or concerns? Yes car battery has died and does not have transportation  Items Reviewed: Did the pt receive and understand the discharge instructions provided? Yes  Medications obtained and verified? Yes  Other? Yes  Any new allergies since your discharge? No  Dietary orders reviewed? Yes Do you have support at home? Yes   Home Care and Equipment/Supplies: Were home health services ordered? yes If so, what is the name of the agency? [facility]  Has the agency set up a time to come to the patient's home? yes Were any new equipment or medical supplies ordered?  No What is the name of the medical supply agency? N/A Were you able to get the supplies/equipment? not applicable Do you have any questions related to the use of the equipment or supplies? No  Functional Questionnaire: (I = Independent and D = Dependent) ADLs: I  Bathing/Dressing- I  Meal Prep- I  Eating- I  Maintaining continence- I  Transferring/Ambulation- I  Managing Meds- I  Follow up appointments reviewed:  PCP Hospital f/u appt confirmed?  No show to PCP no longer listed with practice   Specialist Hospital f/u appt confirmed? Yes  Scheduled to see [facility]ix on 01/04/22 @ 9:30AM. Are transportation arrangements needed? Yes  If their condition worsens, is the pt aware to call PCP or go to the Emergency Dept.? Yes Was the patient provided with contact information for the PCP's office or ED? Yes Was to pt encouraged to call back with questions or concerns? Yes  SDOH assessments and interventions completed:   Yes  Care Coordination Interventions Activated:  Yes   Care Coordination Interventions:  Referred for Care Coordination Services:  RN Care Coordinator    Transportation referral made Encounter Outcome:  Pt. Visit Completed   Dudley Major RN, BSN,CCM, CDE Care Management Coordinator [facility] Care Management [phone]

## 2021-12-31 NOTE — Telephone Encounter (Signed)
Called to confirm/remind patient of their appointment at the [facility] on 01/04/22.   Patient reminded to bring all medications and/or complete list.  Confirmed patient has transportation. Gave directions, instructed to utilize valet parking.  Confirmed appointment prior to ending call.   

## 2021-12-31 NOTE — Telephone Encounter (Signed)
   Telephone encounter was:  Successful.  12/31/2021 Name: Thomas West MRN: [id number] DOB: 1960-11-23  Thomas West is a 61 y.o. year old male who is a primary care patient of No primary care provider on file. . The community resource team was consulted for assistance with Transportation Needs   Care guide performed the following interventions: Patient provided with information about care guide support team and interviewed to confirm resource needs.  SET RIDE FOR 01/04/2022 8:30 AM FOR 9:00 APPOINTMENT Follow Up Plan:  Care guide will follow up with patient by phone over the next DAY    [facility] Guide, Embedded Care Coordination [facility], Care Management  [phone] [address] [place], [place], [place] [address] Phone: [phone] Email: [email or web address]

## 2022-01-01 ENCOUNTER — Ambulatory Visit: Payer: Self-pay

## 2022-01-01 ENCOUNTER — Other Ambulatory Visit (HOSPITAL_COMMUNITY): Payer: Self-pay

## 2022-01-01 NOTE — Patient Outreach (Signed)
  Care Coordination   01/01/2022 Name: Thomas West MRN: [id number] DOB: 10-18-1960   Care Coordination Outreach Attempts:  An unsuccessful telephone outreach was attempted today to offer the patient information about available care coordination services as a benefit of their health plan.   Follow Up Plan:  Additional outreach attempts will be made to offer the patient care coordination information and services.   Encounter Outcome:  No Answer  Care Coordination Interventions Activated:  No   Care Coordination Interventions:  No, not indicated    Juanell Fairly RN, BSN, [facility] Care Coordinator [facility]   Phone: [phone]

## 2022-01-04 ENCOUNTER — Ambulatory Visit (HOSPITAL_COMMUNITY)
Admission: RE | Admit: 2022-01-04 | Discharge: 2022-01-04 | Disposition: A | Discharge status: Home / Self Care | Payer: Medicare Other | Source: Ambulatory Visit | Attending: Internal Medicine | Admitting: Internal Medicine

## 2022-01-04 ENCOUNTER — Encounter (HOSPITAL_COMMUNITY): Payer: Self-pay

## 2022-01-04 ENCOUNTER — Telehealth (HOSPITAL_COMMUNITY): Payer: Self-pay

## 2022-01-04 ENCOUNTER — Other Ambulatory Visit (HOSPITAL_COMMUNITY): Payer: Self-pay

## 2022-01-04 VITALS — BP 124/84 | HR 83 | Wt 151.6 lb

## 2022-01-04 DIAGNOSIS — E1165 Type 2 diabetes mellitus with hyperglycemia: Secondary | ICD-10-CM | POA: Insufficient documentation

## 2022-01-04 DIAGNOSIS — E11610 Type 2 diabetes mellitus with diabetic neuropathic arthropathy: Secondary | ICD-10-CM

## 2022-01-04 DIAGNOSIS — E854 Organ-limited amyloidosis: Secondary | ICD-10-CM

## 2022-01-04 DIAGNOSIS — I43 Cardiomyopathy in diseases classified elsewhere: Secondary | ICD-10-CM

## 2022-01-04 DIAGNOSIS — N182 Chronic kidney disease, stage 2 (mild): Secondary | ICD-10-CM | POA: Insufficient documentation

## 2022-01-04 DIAGNOSIS — R0602 Shortness of breath: Secondary | ICD-10-CM | POA: Insufficient documentation

## 2022-01-04 DIAGNOSIS — I10 Essential (primary) hypertension: Secondary | ICD-10-CM

## 2022-01-04 DIAGNOSIS — I2510 Atherosclerotic heart disease of native coronary artery without angina pectoris: Secondary | ICD-10-CM

## 2022-01-04 DIAGNOSIS — N183 Chronic kidney disease, stage 3 unspecified: Secondary | ICD-10-CM | POA: Diagnosis not present

## 2022-01-04 DIAGNOSIS — E1122 Type 2 diabetes mellitus with diabetic chronic kidney disease: Secondary | ICD-10-CM | POA: Diagnosis not present

## 2022-01-04 DIAGNOSIS — Z91199 Patient's noncompliance with other medical treatment and regimen due to unspecified reason: Secondary | ICD-10-CM | POA: Insufficient documentation

## 2022-01-04 DIAGNOSIS — Z8673 Personal history of transient ischemic attack (TIA), and cerebral infarction without residual deficits: Secondary | ICD-10-CM | POA: Diagnosis not present

## 2022-01-04 DIAGNOSIS — Z9181 History of falling: Secondary | ICD-10-CM | POA: Insufficient documentation

## 2022-01-04 DIAGNOSIS — I252 Old myocardial infarction: Secondary | ICD-10-CM | POA: Insufficient documentation

## 2022-01-04 DIAGNOSIS — I493 Ventricular premature depolarization: Secondary | ICD-10-CM

## 2022-01-04 DIAGNOSIS — G629 Polyneuropathy, unspecified: Secondary | ICD-10-CM | POA: Diagnosis not present

## 2022-01-04 DIAGNOSIS — I5022 Chronic systolic (congestive) heart failure: Secondary | ICD-10-CM | POA: Diagnosis not present

## 2022-01-04 DIAGNOSIS — Z79899 Other long term (current) drug therapy: Secondary | ICD-10-CM | POA: Insufficient documentation

## 2022-01-04 DIAGNOSIS — E1140 Type 2 diabetes mellitus with diabetic neuropathy, unspecified: Secondary | ICD-10-CM | POA: Insufficient documentation

## 2022-01-04 DIAGNOSIS — I5020 Unspecified systolic (congestive) heart failure: Secondary | ICD-10-CM

## 2022-01-04 DIAGNOSIS — Z7984 Long term (current) use of oral hypoglycemic drugs: Secondary | ICD-10-CM | POA: Diagnosis not present

## 2022-01-04 DIAGNOSIS — I130 Hypertensive heart and chronic kidney disease with heart failure and stage 1 through stage 4 chronic kidney disease, or unspecified chronic kidney disease: Secondary | ICD-10-CM | POA: Diagnosis not present

## 2022-01-04 LAB — BASIC METABOLIC PANEL
Anion gap: 7 (ref 5–15)
BUN: 28 mg/dL — ABNORMAL HIGH (ref 6–20)
CO2: 23 mmol/L (ref 22–32)
Calcium: 8.9 mg/dL (ref 8.9–10.3)
Chloride: 108 mmol/L (ref 98–111)
Creatinine, Ser: 1.7 mg/dL — ABNORMAL HIGH (ref 0.61–1.24)
GFR, Estimated: 46 mL/min — ABNORMAL LOW (ref >60)
Glucose, Bld: 153 mg/dL — ABNORMAL HIGH (ref 70–99)
Potassium: 4.4 mmol/L (ref 3.5–5.1)
Sodium: 138 mmol/L (ref 135–145)

## 2022-01-04 NOTE — Progress Notes (Signed)
ReDS Vest / Clip - 01/04/22 0900       ReDS Vest / Clip   Station Marker C    Ruler Value 25.5    ReDS Value Range Moderate volume overload    ReDS Actual Value 36

## 2022-01-04 NOTE — Patient Instructions (Addendum)
EKG done today.  Labs done today. We will contact you only if your labs are abnormal.  No medication changes were made. Please continue all current medications as prescribed.  Your physician recommends that you schedule a follow-up appointment in: 3 months with Dr. Gala Romney.   If you have any questions or concerns before your next appointment please send [place] a message through [place] or call our office at [phone].    TO LEAVE A MESSAGE FOR THE NURSE SELECT OPTION 2, PLEASE LEAVE A MESSAGE INCLUDING: YOUR NAME DATE OF BIRTH CALL BACK NUMBER REASON FOR CALL**this is important as we prioritize the call backs  YOU WILL RECEIVE A CALL BACK THE SAME DAY AS LONG AS YOU CALL BEFORE 4:00 PM   Do the following things EVERYDAY: Weigh yourself in the morning before breakfast. Write it down and keep it in a log. Take your medicines as prescribed Eat low salt foods--Limit salt (sodium) to 2000 mg per day.  Stay as active as you can everyday Limit all fluids for the day to less than 2 liters   At the [facility], you and your health needs are our priority. As part of our continuing mission to provide you with exceptional heart care, we have created designated Provider Care Teams. These Care Teams include your primary Cardiologist (physician) and Advanced Practice Providers (APPs- Physician Assistants and Nurse Practitioners) who all work together to provide you with the care you need, when you need it.   You may see any of the following providers on your designated Care Team at your next follow up: Dr Arvilla Meres Dr Carron Curie, NP Robbie Lis, [place] Karle Plumber, PharmD   Please be sure to bring in all your medications bottles to every appointment.

## 2022-01-04 NOTE — Progress Notes (Signed)
Paramedicine Encounter    Patient ID: Thomas West, male    DOB: 04-04-1961, 61 y.o.   MRN: [id number]   Met with Thomas West in clinic today where he was seen by Allena Katz, NP. She reviewed patient's meds and preformed assessment and did not make any medication changes. Thomas West confirmed his medication pill box was full from when [name] filled it and he has no needs at this time.   I advised him if he did to let me know and I would be able to assist this week, and reminded him to call [name]'s phone as I will have it.   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 I will make sure [name] follows up with him successfully scheduling this. Their office should reach out to schedule but, will need following.   Thomas West did also mention wanting a continuing glucose monitor. Janett Billow advised him to be sure to follow up with Primary Care for same. (Betty [name])  I will make sure [name] knows to have him follow up for same.   I will follow up if labs lead to med changes also.    Visit complete. Thomas West knows to reach out to me if needed.   Salena Saner, EMT-Paramedic [phone] 01/04/2022         ACTION: Home visit completed

## 2022-01-04 NOTE — Telephone Encounter (Signed)
Spoke to [name] who confirmed he will be at his clinic appointment today but did not bring his medications or pill box with him. I informed him if there are any changes I will follow up with a home visit- he agreed with this plan. I will follow up.   Maralyn Sago, EMT-Paramedic [phone] 01/04/2022

## 2022-01-04 NOTE — Progress Notes (Signed)
[facility] Note   Primary Care: Dr. Betty [name] HF Cardiologist: Dr. Haroldine Laws  HPI: Thomas West is a 61 y.o. male with history of CAD with prior MI and stent in 01/2019 at [facility] in [place] (report not available), cardiomyopathy/chronic systolic HF, uncontrolled DM II, HTN, hx CVA on chart review, CKD.    Moved to [place] from [place] 2 years ago.   Had not medical f/u until he was admitted in 55/97 with a/c systolic HF. Had been out of cardiac medications. Diuresed with IV lasix then transitioned to po lasix 40 mg daily. Started on GDMT with carvedilol, hydralazine, imdur and empagliflozin.    Echo 10/22 with EF 20-25%, RV okay, trivial MR   [facility] 10/22 with nonobstructive CAD and patent RPLV stent. RA mean 5 mmHg, PCWP mean 5 mmHg, Fick CO 7.46/CI 3.85.   He did not show up for [facility] appointment after discharge.   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  Admitted 12/22 with a/c CHF due to noncompliance with GDMT. cMRI strongly suggestive of amyloidosis, GDMT titrated. Admitted 2/23 with a/c CHF after running out of meds x 1 week. Given IV lasix, GDMT restarted. Imdur held with low BP. Seen in ED 3/23 with back pain, felt to be related to MSK. Seen in ED 08/18/21 with SOB, cardiac work up unrevealing.  Admitted 12/07/21-12/10/21 post fall w/ a/c CHF. Diuresed well with IV lasix/metolazone and discharged. He developed an AKI Cr >> 1.2>>1.75, Entresto and spiro were held and he was instructed to restart the next day following discharge. He was seen by paramedicine the following day and he was in no acute distress, taking all meds. Denied CP, SOB and overall feeling well.   Readmitted 12/12/21 w/ 2 days of persistent N/V/D with intermittent CP and SOB. Presented with AKI cr 3.3 2/2 emesis/diarrhea. Responded well to 500 mL NS bolus and  holding of Entresto and spiro. Restarted meds once discharged.   Admitted 8/23 with a/c CHF. Diuresed with IV lasix. Hospitalization c/b with AKI, GDMT initially held; Entresto resumed at low dose, torsemide on MWF. Discharged home, weight 145 lbs.  Today he returns for post hospital HF follow up. Overall feeling fine. Neuropathy improved him legs. He walks around his apartment complex with his cane. He has SOB walking up steps but otherwise no issues. Feels occasional palps. Denies abnormal bleeding, CP, dizziness, edema, or PND/Orthopnea. Appetite ok. No fever or chills. Weight at home 147-151 pounds. Taking all medications. Paramedicine helping.  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   - cMRI (11/22): LVEF 24% RVEF 21% LGE and ECW suggestive of cardiac amyloidosis  ROS: All systems reviewed and negative except as per HPI.   Past Medical History:  Diagnosis Date   CHF (congestive heart failure) (HCC)    Coronary artery disease    Diabetes mellitus without complication (HCC)    History of MI (myocardial infarction) 03/05/2019   Hypertension    Stroke [facility])    Current Outpatient Medications  Medication Sig Dispense Refill   aspirin EC 81 MG tablet Take 1 tablet (81 mg total) by mouth daily. Swallow whole. 30 tablet 12   atorvastatin (LIPITOR) 80 MG tablet Take 1 tablet (  80 mg total) by mouth daily. 30 tablet 6   carvedilol (COREG) 6.25 MG tablet Take 1 tablet (6.25 mg total) by mouth EVERY 12 HOURS (10AM &10PM) 60 tablet 6   clopidogrel (PLAVIX) 75 MG tablet Take 75 mg by mouth daily.     cyanocobalamin 1000 MCG tablet Take 1 tablet (1,000 mcg total) by mouth daily. 30 tablet 0   empagliflozin (JARDIANCE) 10 MG TABS tablet Take 1 tablet (10 mg total) by mouth daily. 30 tablet 0   gabapentin (NEURONTIN) 400 MG capsule Take 400  mg by mouth daily.     hydrALAZINE (APRESOLINE) 50 MG tablet Take 1 tablet (50 mg total) by mouth 3 (three) times daily. 90 tablet 6   hydrocortisone cream 1 % Apply to affected area 2 times daily (Patient taking differently: Apply 1 Application topically See admin instructions. Apply to affected areas of the back 3 times a day) 28 g 0   isosorbide mononitrate (IMDUR) 30 MG 24 hr tablet Take 1 tablet (30 mg total) by mouth daily. 30 tablet 6   sacubitril-valsartan (ENTRESTO) 24-26 MG Take 1 tablet by mouth 2 (two) times daily. 60 tablet 0   senna-docusate (SENOKOT-S) 8.6-50 MG tablet Take 1 tablet by mouth at bedtime. 20 tablet 0   spironolactone (ALDACTONE) 25 MG tablet Take 1 tablet (25 mg total) by mouth daily. 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 30 capsule 11   torsemide (DEMADEX) 20 MG tablet Take 1 tablet (20 mg total) by mouth every Monday, Wednesday, and Friday. Take 1 tab extra for weight gain, 2-3lbs overnight and 5lbs in 1 week 30 tablet 0   No current facility-administered medications for this encounter.   Allergies  Allergen Reactions   Bee Venom Anaphylaxis, Swelling and Other (See Comments)    Swells all over   Social History   Socioeconomic History   Marital status: Single    Spouse name: Not on file   Number of children: Not on file   Years of education: Not on file   Highest education level: Not on file  Occupational History   Not on file  Tobacco Use   Smoking status: Former    Types: Cigarettes    Quit date: 10/06/2017    Years since quitting: 4.2   Smokeless tobacco: Never  Substance and Sexual Activity   Alcohol use: Not Currently   Drug use: Yes    Types: Marijuana   Sexual activity: Not on file  Other Topics Concern   Not on file  Social History Narrative   Not on file   Social Determinants of Health   Financial Resource Strain: Low Risk  (03/10/2021)   Overall Financial Resource Strain (CARDIA)    Difficulty of  Paying Living Expenses: Not very hard  Food Insecurity: Food Insecurity Present (12/07/2021)   Hunger Vital Sign    Worried About Running Out of Food in the Last Year: Sometimes true    Ran Out of Food in the Last Year: Never true  Transportation Needs: Unmet Transportation Needs (12/31/2021)   PRAPARE - Hydrologist (Medical): Yes    Lack of Transportation (Non-Medical): Yes  Physical Activity: Not on file  Stress: Not on file  Social Connections: Not on file  Intimate Partner Violence: Not on file    No family history on file.  BP 124/84   Pulse 83   Wt 68.8 kg (151 lb 9.6 oz)  SpO2 98%   BMI 22.38 kg/m   Wt Readings from Last 3 Encounters:  01/04/22 68.8 kg (151 lb 9.6 oz)  12/30/21 65.9 kg (145 lb 3.2 oz)  12/29/21 67.6 kg (149 lb 1.6 oz)   PHYSICAL EXAM: General:  NAD. No resp difficulty, chronically-ill appearing arrived in [facility] HEENT: Normal Neck: Supple. No JVD. Carotids 2+ bilat; no bruits. No lymphadenopathy or thryomegaly appreciated. Cor: PMI nondisplaced. Regular rate & rhythm. No rubs, gallops or murmurs. Lungs: Clear Abdomen: Soft, nontender, nondistended. No hepatosplenomegaly. No bruits or masses. Good bowel sounds. Extremities: No cyanosis, clubbing, rash, edema Neuro: Alert & oriented x 3, cranial nerves grossly intact. Moves all 4 extremities w/o difficulty. Affect pleasant.  ECG (personally reviewed): NSR 82 bpm   REDs: 36%  ASSESSMENT & PLAN: Chronic Systolic HF/Cardiac amyloidosis - Onset not certain. He reports cardiomyopathy diagnosed just prior to MI while living in [place] a couple of years ago. - Echo (10/22): EF 20-25%, RV ok - R/LHC (10/22): Patent RPLV stent with nonobstructive disease elsewhere, Preserved CO/CI, RA mean 5 mmHg, PCWP mean 5 mmHg - cMRI (11/22): LVEF 24% RVEF 21% LGE and ECW suggestive of cardiac amyloidosis. 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 Dr. Haroldine Laws thought equivocal but based on cMRI  likely positive  - Multiple myeloma panel (12/22) with no m-spike. - Genetic testing negative. - Echo (6/23) EF 30-35%, LV global hypokinesis, Grade 1 DD, RV function normal - NYHA II, limited by neuropathy. Volume looks OK today, REDs 36%.  - Continue torsemide 20 mg MWF. Can take extra 20 mg PRN weight gain/swelling - Continue hydralazine 50 mg tid and Imdur 30 mg daily.  - Continue Entresto 24/26 mg bid. - Continue Jardiance 10 mg daily. - Continue carvedilol 6.25 mg bid.  - Continue spironolactone 25 mg daily.   - Continue Tafamadis. - Consider cardioMEMS placement for better monitoring of volume status.  - Labs today.   2. CAD - Prior MI followed by PCI in [place] in either 2019 or 2020.  - Patent RPLV stent on [facility] 10/22. Also had 20% distal RCA, 30% p LAD, 70% d LAD, 60% OM2 - Stent placed 2020. >77yrsince stent placement, could consider stopping Plavix d/t issues with noncompliance. - No chest pain.  - Continue ASA  - Continue atorvastatin 80 mg daily.   3. HTN - Well-controlled. - Continue meds as above.   4. Uncontrolled DM - Needs PCP follow up. - On Januvia. - A1c down to 7.3 from 12.   5. PVCs - None on ECG today.   6. Neuropathy - On gabapentin. Seems to be doing better. - Unclear if due to DM2 or TTR - Referred to Dr. PPosey Prontoin Neurology, no-showed to his appt as his car broke down. Will re-refer. Consider Amvuttra.  7. CKD stage II-IIIa - Baseline SCr 1.2-1.4. - BMET today.   8. Hx fall - CT: L-spine on 12/08/2021 was negative for acute fracture or subluxation of the L-spine. - No further falls.  9. SDOH - He has Medicaid and disability. - Transportation is an issue, he has a car but unable to drive (needs license). - 143year old son helps with meds. - Paramedicine follows, appreciated their assistance.   Keep follow up with Dr. BHaroldine Lawsas scheduled.   JAllena Katz FNP-BC 01/04/22

## 2022-01-05 ENCOUNTER — Ambulatory Visit: Payer: Self-pay

## 2022-01-05 DIAGNOSIS — Z789 Other specified health status: Secondary | ICD-10-CM

## 2022-01-05 NOTE — Patient Instructions (Signed)
Visit Information  Thank you for taking time to visit with me today. Please don't hesitate to contact me if I can be of assistance to you.   Following are the goals we discussed today:   Goals Addressed               This Visit's Progress     I want to stay  healthy (pt-stated)        Care Coordination Interventions: Basic overview and discussion of pathophysiology of Heart Failure reviewed Provided education on low sodium diet Reviewed Heart Failure Action Plan in depth and provided written copy Advised patient to weigh each morning after emptying bladder Reviewed role of diuretics in prevention of fluid overload and management of heart failure; Discussed the importance of keeping all appointments with provider We reviewed his medications and the importance I will send him education about CHF We rescheduled his appointment with Dr Betty [name] to 01/15/22 at 12 pm [facility] He has [facility] coming to the home I will send referral to care guides for Dentist I spoke with the patient regarding their AWV: per the patient, they have had their AWV, and I will send the information to the Quality team.   Active listening / Reflection utilized  Problem Solving /Task Center strategies reviewed         Our next appointment is by telephone on 01/19/22 at 1030 am  Please call the care guide team at [phone] if you need to cancel or reschedule your appointment.   The patient verbalized understanding of instructions, educational materials, and care plan provided today   Juanell Fairly RN, BSN, [facility] Care Coordinator [facility]   Phone: [phone]

## 2022-01-05 NOTE — Patient Outreach (Signed)
  Care Coordination   Initial Visit Note   01/05/2022 Name: Thomas West MRN: [id number] DOB: Apr 29, 1961  [name] is a 61 y.o. year old male who sees No primary care provider on file. for primary care. I spoke with  [name] by phone today  What matters to the patients health and wellness today?  I wsant to stay health    Goals Addressed               This Visit's Progress     I want to stay  healthy (pt-stated)        Care Coordination Interventions: Basic overview and discussion of pathophysiology of Heart Failure reviewed Provided education on low sodium diet Reviewed Heart Failure Action Plan in depth and provided written copy Advised patient to weigh each morning after emptying bladder Reviewed role of diuretics in prevention of fluid overload and management of heart failure; Discussed the importance of keeping all appointments with provider We reviewed his medications and the importance I will send him education about CHF We rescheduled his appointment with Dr Betty [name] to 01/15/22 at 12 pm He went [facility] appointment [facility] He has [facility] coming to the home I will send referral to care guides for Dentist I spoke with the patient regarding their AWV: per the patient, they have had their AWV, and I will send the information to the Quality team.   Active listening / Reflection utilized  Problem Solving /Task Center strategies reviewed         SDOH assessments and interventions completed:  Yes  SDOH Interventions Today    Flowsheet Row Most Recent Value  SDOH Interventions   Food Insecurity Interventions Intervention Not Indicated  Housing Interventions Intervention Not Indicated  Transportation Interventions Intervention Not Indicated        Care Coordination Interventions Activated:  Yes  Care Coordination Interventions:  Yes, provided   Follow up plan: Follow up call scheduled for 01/19/22 1030 am    Encounter Outcome:   Pt. Scheduled   Juanell Fairly RN, BSN, [facility] Care Coordinator [facility]   Phone: [phone]

## 2022-01-06 ENCOUNTER — Ambulatory Visit: Payer: Self-pay

## 2022-01-06 ENCOUNTER — Telehealth: Payer: Self-pay | Admitting: *Deleted

## 2022-01-06 ENCOUNTER — Telehealth (HOSPITAL_COMMUNITY): Payer: Self-pay

## 2022-01-06 NOTE — Patient Instructions (Signed)
Visit Information  Thank you for taking time to visit with me today. Please don't hesitate to contact me if I can be of assistance to you.   Following are the goals we discussed today:   Goals Addressed             This Visit's Progress    COMPLETED: I neeed my pill box filled       Care Coordination Interventions: Collaborated with Herbert Seta with EMS regarding medications RN Case Manager received a call from Thomas West stating that his pillbox was empty and he needed assistance in refilling it. I helped by contacting various locations but was unable to secure immediate assistance. I then reached out to [facility] with EMS, who contacted the patient and arranged for someone to assist him the following day. Mr. Nedved called me to update me on the situation.  Active listening / Reflection utilized  Problem Solving /Task Center strategies reviewed          Please call the care guide team at [phone] if you need to cancel or reschedule your appointment.    The patient verbalized understanding of instructions, educational materials, and care plan provided today.

## 2022-01-06 NOTE — Telephone Encounter (Signed)
Received a call from Thomas West who reports his pill box is empty and he is needing it refilled. When I saw him in clinic on Monday he reported his medications were taken care of. Thomas West CHP will be going out to see him for a med rec in the morning around 02-1029. Thomas West agreeable with plan. Call complete.   Thomas West, EMT-Paramedic [phone] 01/06/2022

## 2022-01-06 NOTE — Telephone Encounter (Signed)
   Telephone encounter was:  Successful.  01/06/2022 Name: Thomas West MRN: [id number] DOB: May 20, 1961  Thomas West is a 61 y.o. year old male who is a primary care patient of No primary care provider on file. . The community resource team was consulted for assistance with  Dentists accepting [facility]   Care guide performed the following interventions: Patient provided with information about care guide support team and interviewed to confirm resource needs. Patient provided with list of dentists that take welcare of [place]  Follow Up Plan:  No further follow up planned at this time. The patient has been provided with needed resources.  Yehuda Mao Greenauer -[facility], Population Health [phone] [address] , [place] [address] Email : [email or web address]

## 2022-01-06 NOTE — Patient Outreach (Signed)
  Care Coordination   Medication Assistance  Visit Note   01/06/2022 Name: Thomas West MRN: [id number] DOB: [birth year]  Thomas West is a 61 y.o. year old male who sees No primary care provider on file. for primary care. I spoke with  Thomas West by phone today  What matters to the patients health and wellness today?  Fill my pill box    Goals Addressed             This Visit's Progress    COMPLETED: I neeed my pill box filled       Care Coordination Interventions: Collaborated with Thomas West with EMS regarding medications RN Case Manager received a call from Thomas West stating that his pillbox was empty and he needed assistance in refilling it. I helped by contacting various locations but was unable to secure immediate assistance. I then reached out to [facility] with EMS, who contacted the patient and arranged for someone to assist him the following day. Thomas West called me to update me on the situation.  Active listening / Reflection utilized  Problem Solving /Task Center strategies reviewed         SDOH assessments and interventions completed:  No     Care Coordination Interventions Activated:  Yes  Care Coordination Interventions:  Yes, provided   Follow up plan: No further intervention required.   Encounter Outcome:  Pt. Visit Completed   Thomas Fairly RN, BSN, [facility] Care Coordinator [facility]   Phone: [phone]

## 2022-01-07 ENCOUNTER — Other Ambulatory Visit (HOSPITAL_COMMUNITY): Payer: Self-pay

## 2022-01-07 NOTE — Progress Notes (Signed)
Pt was seen in clinic Monday but ddint mention he needed pill box refilled until yesterday.  His son got his pills for him last night-I set out pills for him to take this morning so he does have a full week now.   Refills needed:   Clopidogrel  Son said he would call it in Monday.   Son reports he stopped taking his gabapentin due to stomach side effects.   Meds verified and pill box refilled.   Kerry Hough, EMT-Paramedic  [phone] 01/07/2022

## 2022-01-08 ENCOUNTER — Other Ambulatory Visit (HOSPITAL_COMMUNITY): Payer: Self-pay

## 2022-01-09 DIAGNOSIS — W19XXXD Unspecified fall, subsequent encounter: Secondary | ICD-10-CM | POA: Diagnosis not present

## 2022-01-09 DIAGNOSIS — Z9181 History of falling: Secondary | ICD-10-CM | POA: Diagnosis not present

## 2022-01-09 DIAGNOSIS — I5023 Acute on chronic systolic (congestive) heart failure: Secondary | ICD-10-CM | POA: Diagnosis not present

## 2022-01-09 DIAGNOSIS — I130 Hypertensive heart and chronic kidney disease with heart failure and stage 1 through stage 4 chronic kidney disease, or unspecified chronic kidney disease: Secondary | ICD-10-CM | POA: Diagnosis not present

## 2022-01-09 DIAGNOSIS — Z8673 Personal history of transient ischemic attack (TIA), and cerebral infarction without residual deficits: Secondary | ICD-10-CM | POA: Diagnosis not present

## 2022-01-09 DIAGNOSIS — Z7982 Long term (current) use of aspirin: Secondary | ICD-10-CM | POA: Diagnosis not present

## 2022-01-09 DIAGNOSIS — E1165 Type 2 diabetes mellitus with hyperglycemia: Secondary | ICD-10-CM | POA: Diagnosis not present

## 2022-01-09 DIAGNOSIS — Z91128 Patient's intentional underdosing of medication regimen for other reason: Secondary | ICD-10-CM | POA: Diagnosis not present

## 2022-01-09 DIAGNOSIS — N189 Chronic kidney disease, unspecified: Secondary | ICD-10-CM | POA: Diagnosis not present

## 2022-01-09 DIAGNOSIS — E1122 Type 2 diabetes mellitus with diabetic chronic kidney disease: Secondary | ICD-10-CM | POA: Diagnosis not present

## 2022-01-09 DIAGNOSIS — Z87891 Personal history of nicotine dependence: Secondary | ICD-10-CM | POA: Diagnosis not present

## 2022-01-09 DIAGNOSIS — I2510 Atherosclerotic heart disease of native coronary artery without angina pectoris: Secondary | ICD-10-CM | POA: Diagnosis not present

## 2022-01-09 DIAGNOSIS — I252 Old myocardial infarction: Secondary | ICD-10-CM | POA: Diagnosis not present

## 2022-01-09 DIAGNOSIS — E1140 Type 2 diabetes mellitus with diabetic neuropathy, unspecified: Secondary | ICD-10-CM | POA: Diagnosis not present

## 2022-01-09 DIAGNOSIS — E8582 Wild-type transthyretin-related (ATTR) amyloidosis: Secondary | ICD-10-CM | POA: Diagnosis not present

## 2022-01-11 ENCOUNTER — Telehealth: Payer: Self-pay | Admitting: Family Medicine

## 2022-01-11 NOTE — Telephone Encounter (Signed)
Thomas West at [facility]  [phone]  Verbal Orders - PT  Once a wk for 5 wks Beginning 01/09/22  Gait training, balance, and strengthening

## 2022-01-12 ENCOUNTER — Telehealth: Payer: Self-pay

## 2022-01-12 ENCOUNTER — Ambulatory Visit: Payer: Medicare Other

## 2022-01-12 NOTE — Telephone Encounter (Signed)
It is ok to give verbal authorization for requested services. Thanks, BJ 

## 2022-01-12 NOTE — Telephone Encounter (Signed)
Unable to complete AWV. Contacted patient on preferred number listed in notes for scheduled AWV. Patient stated this visit was completed in his home a few days ago.

## 2022-01-13 ENCOUNTER — Telehealth: Payer: Self-pay | Admitting: Family Medicine

## 2022-01-13 DIAGNOSIS — Z7982 Long term (current) use of aspirin: Secondary | ICD-10-CM | POA: Diagnosis not present

## 2022-01-13 DIAGNOSIS — I2510 Atherosclerotic heart disease of native coronary artery without angina pectoris: Secondary | ICD-10-CM | POA: Diagnosis not present

## 2022-01-13 DIAGNOSIS — E1122 Type 2 diabetes mellitus with diabetic chronic kidney disease: Secondary | ICD-10-CM | POA: Diagnosis not present

## 2022-01-13 DIAGNOSIS — I252 Old myocardial infarction: Secondary | ICD-10-CM | POA: Diagnosis not present

## 2022-01-13 DIAGNOSIS — E8582 Wild-type transthyretin-related (ATTR) amyloidosis: Secondary | ICD-10-CM | POA: Diagnosis not present

## 2022-01-13 DIAGNOSIS — E1165 Type 2 diabetes mellitus with hyperglycemia: Secondary | ICD-10-CM | POA: Diagnosis not present

## 2022-01-13 DIAGNOSIS — I5023 Acute on chronic systolic (congestive) heart failure: Secondary | ICD-10-CM | POA: Diagnosis not present

## 2022-01-13 DIAGNOSIS — W19XXXD Unspecified fall, subsequent encounter: Secondary | ICD-10-CM | POA: Diagnosis not present

## 2022-01-13 DIAGNOSIS — Z8673 Personal history of transient ischemic attack (TIA), and cerebral infarction without residual deficits: Secondary | ICD-10-CM | POA: Diagnosis not present

## 2022-01-13 DIAGNOSIS — Z91128 Patient's intentional underdosing of medication regimen for other reason: Secondary | ICD-10-CM | POA: Diagnosis not present

## 2022-01-13 DIAGNOSIS — I130 Hypertensive heart and chronic kidney disease with heart failure and stage 1 through stage 4 chronic kidney disease, or unspecified chronic kidney disease: Secondary | ICD-10-CM | POA: Diagnosis not present

## 2022-01-13 DIAGNOSIS — Z9181 History of falling: Secondary | ICD-10-CM | POA: Diagnosis not present

## 2022-01-13 DIAGNOSIS — E1140 Type 2 diabetes mellitus with diabetic neuropathy, unspecified: Secondary | ICD-10-CM | POA: Diagnosis not present

## 2022-01-13 DIAGNOSIS — Z87891 Personal history of nicotine dependence: Secondary | ICD-10-CM | POA: Diagnosis not present

## 2022-01-13 DIAGNOSIS — N189 Chronic kidney disease, unspecified: Secondary | ICD-10-CM | POA: Diagnosis not present

## 2022-01-13 NOTE — Telephone Encounter (Signed)
Thomas West is aware that patient needs to see provider before anything will be sent in.

## 2022-01-13 NOTE — Progress Notes (Unsigned)
HPI: [name] is a 61 y.o. male with hx of CAD, chronic systolic and diastolic CHF, NSVT, history of CVA, CKD III here today to follow on recent hospital visit. Hospitalized on 12/24/21 and discharged home 12/29/21.  Thomas West has missed f/u appts due to transportation problems, Thomas West just got a car, so hoping Thomas West can keep future appointments.  Presented to the ED c/o worsening SOB, nausea,and vomiting.  Prior hospitalizations x 2 in 11/2021 for acute CHF exacerbation and AKI. HFrEF: Echo 10/2021 LVEF 30-35%. 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  On torsemide 20 mg 3 times per week.  Sleeps on 4 pillows + elevated head bed. SOB stable. Denies worsening orthopnea or PND. Chest tightness, at rest, daily and not associated with exertion, it happens at rest most of the time. It lasts "a little while."This problem has been going on since before hospitalization,stable; and according to pt Thomas West has discussed it with his cardiologist.  Established with CHF clinic on 01/04/2022. CKD 3: Thomas West has not noted decreased urine output, gross hematuria, or foam in urine. Cr since 11/2021 1.6-1.9 and e GFR mid 30's-40's. Thomas West does not have a nephrologist. Thomas West is currently on Spironolactone 25 mg daily, Imdur 30 mg daily, hydralazine 50 mg 3 times daily, and carvedilol 6.25 mg twice daily. Thomas West does not check BP at home. Thomas West uses sea salt with meals.  Lab Results  Component Value Date   CREATININE 1.70 (H) 01/04/2022   BUN 28 (H) 01/04/2022   NA 138 01/04/2022   K 4.4 01/04/2022   CL 108 01/04/2022   CO2 23 01/04/2022   Lab Results  Component Value Date   WBC 7.1 12/27/2021   HGB 14.3 12/27/2021   HCT 42.9 12/27/2021   MCV 84.6 12/27/2021   PLT 352 12/27/2021   Thomas West is on B12 vit and has helped with neuropathic pain. Lab Results  Component Value Date   VITAMINB12 408 08/18/2021   Diabetes Mellitus II: According to patient Thomas West is "cured", Metformin and glucotrol were discontinued during  hospitalization. Currently Thomas West is on Jardiance 10 mg daily.  - Checking BG at home: Not checking. - Diet: Following a healthful diet. - Exercise: Not consistently. - eye exam: A couple years ago. - foot exam: 01/2021.  - Negative for symptoms of hypoglycemia, polyuria, polydipsia, foot ulcers/trauma. Peripheral neuropathy, Thomas West is on gabapentin 400 mg daily. Thomas West also started B12 supplementation, which seems to be helping with symptoms.  Lab Results  Component Value Date   HGBA1C 7.3 (H) 12/13/2021   Lab Results  Component Value Date   MICROALBUR 165.1 (H) 02/03/2021   Constipation: Thomas West is on Senakot daily as needed. Having bowel movements every 1-2 days.. Thomas West has not had colonoscopy, GI referral has been placed. FOBT negative in 07/2021. Abdominal/pelvic CT 11/2021 otherwise negative. Aortic atherosclerosis.  Review of Systems  Constitutional:  Positive for fatigue. Negative for activity change, appetite change and fever.  HENT:  Negative for nosebleeds, sore throat and trouble swallowing.   Eyes:  Negative for redness and visual disturbance.  Respiratory:  Negative for cough and wheezing.   Gastrointestinal:  Negative for abdominal pain, nausea and vomiting.  Genitourinary:  Negative for difficulty urinating and dysuria.  Skin:  Negative for rash.  Neurological:  Negative for syncope, facial asymmetry and weakness.  Rest see pertinent positives and negatives per HPI.  Current Outpatient Medications on File Prior to Visit  Medication Sig Dispense Refill   aspirin EC 81 MG tablet Take 1 tablet (  81 mg total) by mouth daily. Swallow whole. 30 tablet 12   atorvastatin (LIPITOR) 80 MG tablet Take 1 tablet (80 mg total) by mouth daily. 30 tablet 6   carvedilol (COREG) 6.25 MG tablet Take 1 tablet (6.25 mg total) by mouth EVERY 12 HOURS (10AM &10PM) 60 tablet 6   clopidogrel (PLAVIX) 75 MG tablet Take 75 mg by mouth daily.     cyanocobalamin 1000 MCG tablet Take 1 tablet (1,000 mcg  total) by mouth daily. 30 tablet 0   empagliflozin (JARDIANCE) 10 MG TABS tablet Take 1 tablet (10 mg total) by mouth daily. 30 tablet 0   gabapentin (NEURONTIN) 400 MG capsule Take 400 mg by mouth daily.     hydrALAZINE (APRESOLINE) 50 MG tablet Take 1 tablet (50 mg total) by mouth 3 (three) times daily. 90 tablet 6   hydrocortisone cream 1 % Apply to affected area 2 times daily (Patient taking differently: Apply 1 Application topically See admin instructions. Apply to affected areas of the back 3 times a day) 28 g 0   isosorbide mononitrate (IMDUR) 30 MG 24 hr tablet Take 1 tablet (30 mg total) by mouth daily. 30 tablet 6   sacubitril-valsartan (ENTRESTO) 24-26 MG Take 1 tablet by mouth 2 (two) times daily. 60 tablet 0   senna-docusate (SENOKOT-S) 8.6-50 MG tablet Take 1 tablet by mouth at bedtime. 20 tablet 0   spironolactone (ALDACTONE) 25 MG tablet Take 1 tablet (25 mg total) by mouth daily. 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 30 capsule 11   torsemide (DEMADEX) 20 MG tablet Take 1 tablet (20 mg total) by mouth every Monday, Wednesday, and Friday. Take 1 tab extra for weight gain, 2-3lbs overnight and 5lbs in 1 week 30 tablet 0   No current facility-administered medications on file prior to visit.   Past Medical History:  Diagnosis Date   CHF (congestive heart failure) (HCC)    Coronary artery disease    Diabetes mellitus without complication (HCC)    History of MI (myocardial infarction) 03/05/2019   Hypertension    Stroke (HCC)    Allergies  Allergen Reactions   Bee Venom Anaphylaxis, Swelling and Other (See Comments)    Swells all over   Social History   Socioeconomic History   Marital status: Single    Spouse name: Not on file   Number of children: Not on file   Years of education: Not on file   Highest education level: Not on file  Occupational History   Not on file  Tobacco Use   Smoking status: Former    Types: Cigarettes    Quit  date: 10/06/2017    Years since quitting: 4.2   Smokeless tobacco: Never  Substance and Sexual Activity   Alcohol use: Not Currently   Drug use: Yes    Types: Marijuana   Sexual activity: Not on file  Other Topics Concern   Not on file  Social History Narrative   Not on file   Social Determinants of Health   Financial Resource Strain: Low Risk  (03/10/2021)   Overall Financial Resource Strain (CARDIA)    Difficulty of Paying Living Expenses: Not very hard  Food Insecurity: Food Insecurity Present (12/07/2021)   Hunger Vital Sign    Worried About Running Out of Food in the Last Year: Sometimes true    Ran Out of Food in the Last Year: Never true  Transportation Needs: Unmet Transportation Needs (12/31/2021)  PRAPARE - Hydrologist (Medical): Yes    Lack of Transportation (Non-Medical): Yes  Physical Activity: Not on file  Stress: Not on file  Social Connections: Not on file   Vitals:   01/15/22 1138  BP: 138/80  Pulse: 79  Resp: 16  Temp: 98.8 F (37.1 C)  SpO2: 98%   Body mass index is 21.9 kg/m.  Physical Exam Vitals and nursing note reviewed.  Constitutional:      General: Thomas West is not in acute distress.    Appearance: Thomas West is well-developed.  HENT:     Head: Normocephalic and atraumatic.     Mouth/Throat:     Mouth: Mucous membranes are moist.     Pharynx: Oropharynx is clear.  Eyes:     Conjunctiva/sclera: Conjunctivae normal.  Cardiovascular:     Rate and Rhythm: Normal rate and regular rhythm.     Heart sounds: No murmur heard.    Comments: DP pulses palpable. Pulmonary:     Effort: Pulmonary effort is normal. No respiratory distress.     Breath sounds: Normal breath sounds.  Abdominal:     Palpations: Abdomen is soft. There is no hepatomegaly or mass.     Tenderness: There is no abdominal tenderness.  Skin:    General: Skin is warm.     Findings: No erythema or rash.  Neurological:     Mental Status: Thomas West is alert and  oriented to person, place, and time.     Cranial Nerves: No cranial nerve deficit.     Gait: Gait normal.  Psychiatric:     Comments: Well groomed, good eye contact.   ASSESSMENT AND PLAN:  Thomas West was seen today for hospitalization follow-up.  Diagnoses and all orders for this visit:  Chronic kidney disease, stage 3a ([place]) 01/04/22 Cr 1.7 and GFR 46. Good BP and glucose controlled, adequate hydration, and low salt diet recommended. Avoid NSAID's.  Type 2 diabetes mellitus with diabetic neuropathy, unspecified ([place]) HgA1C 7.3 in 11/2021, which is appropriate given his comorbilities. Continue Jardiance 10 mg daily. Annual eye exam, periodic dental and foot care recommended. F/U in 3 months.  Hypertension, essential, benign BP adequately controlled. Continue carvedilol 6.25 milligrams twice daily, hydralazine 50 mg 3 times daily, Imdur 30 mg daily, spironolactone 25 mg daily, and Entresto same dose. Recommend monitoring BP regularly. Low-salt diet recommended. Thomas West is due for eye exam.  HFrEF (heart failure with reduced ejection fraction) ([place]) Thomas West is on Entresto, carvedilol, torsemide, and Jardiance. Stressed the importance of compliance with medications,follow ups,and with following low-salt diet.  Polyneuropathy associated with underlying disease (HCC) Continue gabapentin 400 mg daily. Educated about appropriate skin/foot care.  Atherosclerosis of aorta ([place]) Thomas West is on Atorvastatin 80 gm daily and Aspirin 81 mg daily. LDL 86 in 02/2021. Thomas West is not fasting today.  Coronary artery disease involving native heart, unspecified vessel or lesion type, unspecified whether angina present Cardiac cath in 02/2021. Continue Plavix 75 mg daily,Carvedilol 6.25 mg bid, Imdur 30 mg daily,and Atorvastatin 80 mg daily. Instructed about warning signs.  I spent a total of 40 minutes in both face to face and non face to face activities for this visit on the date of this encounter. During this  time history was obtained and documented, examination was performed, prior labs/imaging reviewed, and assessment/plan discussed as described under problems described above.  Return in about 3 months (around 04/17/2022).  Viraaj Vorndran G. [name], MD  [facility]. [place] office.

## 2022-01-13 NOTE — Telephone Encounter (Signed)
VO given.

## 2022-01-13 NOTE — Telephone Encounter (Signed)
Requesting for lidocaine patches, worked while he was inpatient. Patient also wants to talk about using the Dexcom for glucose monitoring.

## 2022-01-15 ENCOUNTER — Encounter: Payer: Self-pay | Admitting: Family Medicine

## 2022-01-15 ENCOUNTER — Telehealth: Payer: Self-pay | Admitting: *Deleted

## 2022-01-15 ENCOUNTER — Telehealth: Payer: Self-pay | Admitting: Family Medicine

## 2022-01-15 ENCOUNTER — Ambulatory Visit (INDEPENDENT_AMBULATORY_CARE_PROVIDER_SITE_OTHER): Payer: Medicare Other | Admitting: Family Medicine

## 2022-01-15 VITALS — BP 138/80 | HR 79 | Temp 98.8°F | Resp 16 | Ht 69.02 in | Wt 148.4 lb

## 2022-01-15 DIAGNOSIS — E8582 Wild-type transthyretin-related (ATTR) amyloidosis: Secondary | ICD-10-CM | POA: Diagnosis not present

## 2022-01-15 DIAGNOSIS — Z7982 Long term (current) use of aspirin: Secondary | ICD-10-CM | POA: Diagnosis not present

## 2022-01-15 DIAGNOSIS — E1165 Type 2 diabetes mellitus with hyperglycemia: Secondary | ICD-10-CM | POA: Diagnosis not present

## 2022-01-15 DIAGNOSIS — Z91128 Patient's intentional underdosing of medication regimen for other reason: Secondary | ICD-10-CM | POA: Diagnosis not present

## 2022-01-15 DIAGNOSIS — I10 Essential (primary) hypertension: Secondary | ICD-10-CM

## 2022-01-15 DIAGNOSIS — Z8673 Personal history of transient ischemic attack (TIA), and cerebral infarction without residual deficits: Secondary | ICD-10-CM | POA: Diagnosis not present

## 2022-01-15 DIAGNOSIS — E1122 Type 2 diabetes mellitus with diabetic chronic kidney disease: Secondary | ICD-10-CM | POA: Diagnosis not present

## 2022-01-15 DIAGNOSIS — I5020 Unspecified systolic (congestive) heart failure: Secondary | ICD-10-CM

## 2022-01-15 DIAGNOSIS — E1140 Type 2 diabetes mellitus with diabetic neuropathy, unspecified: Secondary | ICD-10-CM | POA: Diagnosis not present

## 2022-01-15 DIAGNOSIS — Z87891 Personal history of nicotine dependence: Secondary | ICD-10-CM | POA: Diagnosis not present

## 2022-01-15 DIAGNOSIS — I252 Old myocardial infarction: Secondary | ICD-10-CM | POA: Diagnosis not present

## 2022-01-15 DIAGNOSIS — I700 Atherosclerosis of aorta: Secondary | ICD-10-CM | POA: Diagnosis not present

## 2022-01-15 DIAGNOSIS — G63 Polyneuropathy in diseases classified elsewhere: Secondary | ICD-10-CM | POA: Diagnosis not present

## 2022-01-15 DIAGNOSIS — I5023 Acute on chronic systolic (congestive) heart failure: Secondary | ICD-10-CM | POA: Diagnosis not present

## 2022-01-15 DIAGNOSIS — W19XXXD Unspecified fall, subsequent encounter: Secondary | ICD-10-CM | POA: Diagnosis not present

## 2022-01-15 DIAGNOSIS — N1831 Chronic kidney disease, stage 3a: Secondary | ICD-10-CM

## 2022-01-15 DIAGNOSIS — I130 Hypertensive heart and chronic kidney disease with heart failure and stage 1 through stage 4 chronic kidney disease, or unspecified chronic kidney disease: Secondary | ICD-10-CM | POA: Diagnosis not present

## 2022-01-15 DIAGNOSIS — I2510 Atherosclerotic heart disease of native coronary artery without angina pectoris: Secondary | ICD-10-CM | POA: Diagnosis not present

## 2022-01-15 DIAGNOSIS — N189 Chronic kidney disease, unspecified: Secondary | ICD-10-CM | POA: Diagnosis not present

## 2022-01-15 DIAGNOSIS — Z9181 History of falling: Secondary | ICD-10-CM | POA: Diagnosis not present

## 2022-01-15 MED ORDER — DEXCOM G6 SENSOR MISC: 1 refills | Status: DC

## 2022-01-15 MED ORDER — DEXCOM G6 TRANSMITTER MISC: 1.0000 | 3 refills | Status: DC

## 2022-01-15 NOTE — Chronic Care Management (AMB) (Signed)
  Care Coordination  Outreach Note  01/15/2022 Name: Thomas West MRN: [id number] DOB: 1960-10-12   Care Coordination Outreach Attempts  An unsuccessful telephone outreach was attempted today to offer the patient information about available care coordination services as a benefit of their health plan.   Outreach to reschedule follow up with RNCM due to being out of office   Follow Up Plan:  Additional outreach attempts will be made to offer the patient care coordination information and services.   Encounter Outcome:  No Answer  Christie Nottingham  Care Coordination Care Guide  Direct Dial: [phone]

## 2022-01-15 NOTE — Telephone Encounter (Signed)
These were sent in during pt's visit.

## 2022-01-15 NOTE — Assessment & Plan Note (Addendum)
Continue gabapentin 400 mg daily. Educated about appropriate skin/foot care.

## 2022-01-15 NOTE — Assessment & Plan Note (Signed)
BP adequately controlled. Continue carvedilol 6.25 milligrams twice daily, hydralazine 50 mg 3 times daily, Imdur 30 mg daily, spironolactone 25 mg daily, and Entresto same dose. Recommend monitoring BP regularly. Low-salt diet recommended. He is following with cardiologist every couple weeks.

## 2022-01-15 NOTE — Telephone Encounter (Signed)
Pt saw dr [name] today and forgot to mention he would like new rx dexcom please send to  [facility] [place], [place] - [address] Phone:  [phone]  Fax:  [phone]

## 2022-01-15 NOTE — Assessment & Plan Note (Signed)
He is on Entresto, carvedilol, torsemide, and Jardiance. Stressed the importance of compliance with medications,follow ups,and with following low-salt diet.

## 2022-01-15 NOTE — Assessment & Plan Note (Signed)
Cr and e GFR otherwise stable. Good BP and glucose controlled, adequate hydration, and low salt diet recommended. Avoid NSAID's.

## 2022-01-15 NOTE — Patient Instructions (Addendum)
A few things to remember from today's visit:  Type 2 diabetes mellitus with diabetic neuropathy, without long-term current use of insulin (HCC)  Hypertension, essential, benign  HFrEF (heart failure with reduced ejection fraction) (HCC)  Chronic kidney disease, stage 3a (HCC)  If you need refills please call your pharmacy. Do not use My Chart to request refills or for acute issues that need immediate attention.   No changes today. I will see you back in 3 months, before if needed.  You need an eye exam. Please be sure medication list is accurate. If a new problem present, please set up appointment sooner than planned today.  Diabetes Mellitus and Nutrition, Adult When you have diabetes, or diabetes mellitus, it is very important to have healthy eating habits because your blood sugar (glucose) levels are greatly affected by what you eat and drink. Eating healthy foods in the right amounts, at about the same times every day, can help you: Manage your blood glucose. Lower your risk of heart disease. Improve your blood pressure. Reach or maintain a healthy weight. What can affect my meal plan? Every person with diabetes is different, and each person has different needs for a meal plan. Your health care provider may recommend that you work with a dietitian to make a meal plan that is best for you. Your meal plan may vary depending on factors such as: The calories you need. The medicines you take. Your weight. Your blood glucose, blood pressure, and cholesterol levels. Your activity level. Other health conditions you have, such as heart or kidney disease. How do carbohydrates affect me? Carbohydrates, also called carbs, affect your blood glucose level more than any other type of food. Eating carbs raises the amount of glucose in your blood. It is important to know how many carbs you can safely have in each meal. This is different for every person. Your dietitian can help you calculate  how many carbs you should have at each meal and for each snack. How does alcohol affect me? Alcohol can cause a decrease in blood glucose (hypoglycemia), especially if you use insulin or take certain diabetes medicines by mouth. Hypoglycemia can be a life-threatening condition. Symptoms of hypoglycemia, such as sleepiness, dizziness, and confusion, are similar to symptoms of having too much alcohol. Do not drink alcohol if: Your health care provider tells you not to drink. You are pregnant, may be pregnant, or are planning to become pregnant. If you drink alcohol: Limit how much you have to: 0-1 drink a day for women. 0-2 drinks a day for men. Know how much alcohol is in your drink. In the [place] one drink equals one 12 oz bottle of beer (355 mL), one 5 oz glass of wine (148 mL), or one 1 oz glass of hard liquor (44 mL). Keep yourself hydrated with water, diet soda, or unsweetened iced tea. Keep in mind that regular soda, juice, and other mixers may contain a lot of sugar and must be counted as carbs. What are tips for following this plan?  Reading food labels Start by checking the serving size on the Nutrition Facts label of packaged foods and drinks. The number of calories and the amount of carbs, fats, and other nutrients listed on the label are based on one serving of the item. Many items contain more than one serving per package. Check the total grams (g) of carbs in one serving. Check the number of grams of saturated fats and trans fats in one serving. Choose foods  that have a low amount or none of these fats. Check the number of milligrams (mg) of salt (sodium) in one serving. Most people should limit total sodium intake to less than 2,300 mg per day. Always check the nutrition information of foods labeled as "low-fat" or "nonfat." These foods may be higher in added sugar or refined carbs and should be avoided. Talk to your dietitian to identify your daily goals for nutrients listed on the  label. Shopping Avoid buying canned, pre-made, or processed foods. These foods tend to be high in fat, sodium, and added sugar. Shop around the outside edge of the grocery store. This is where you will most often find fresh fruits and vegetables, bulk grains, fresh meats, and fresh dairy products. Cooking Use low-heat cooking methods, such as baking, instead of high-heat cooking methods, such as deep frying. Cook using healthy oils, such as olive, canola, or sunflower oil. Avoid cooking with butter, cream, or high-fat meats. Meal planning Eat meals and snacks regularly, preferably at the same times every day. Avoid going long periods of time without eating. Eat foods that are high in fiber, such as fresh fruits, vegetables, beans, and whole grains. Eat 4-6 oz (112-168 g) of lean protein each day, such as lean meat, chicken, fish, eggs, or tofu. One ounce (oz) (28 g) of lean protein is equal to: 1 oz (28 g) of meat, chicken, or fish. 1 egg.  cup (62 g) of tofu. Eat some foods each day that contain healthy fats, such as avocado, nuts, seeds, and fish. What foods should I eat? Fruits Berries. Apples. Oranges. Peaches. Apricots. Plums. Grapes. Mangoes. Papayas. Pomegranates. Kiwi. Cherries. Vegetables Leafy greens, including lettuce, spinach, kale, chard, collard greens, mustard greens, and cabbage. Beets. Cauliflower. Broccoli. Carrots. Green beans. Tomatoes. Peppers. Onions. Cucumbers. Brussels sprouts. Grains Whole grains, such as whole-wheat or whole-grain bread, crackers, tortillas, cereal, and pasta. Unsweetened oatmeal. Quinoa. Brown or wild rice. Meats and other proteins Seafood. Poultry without skin. Lean cuts of poultry and beef. Tofu. Nuts. Seeds. Dairy Low-fat or fat-free dairy products such as milk, yogurt, and cheese. The items listed above may not be a complete list of foods and beverages you can eat and drink. Contact a dietitian for more information. What foods should I  avoid? Fruits Fruits canned with syrup. Vegetables Canned vegetables. Frozen vegetables with butter or cream sauce. Grains Refined white flour and flour products such as bread, pasta, snack foods, and cereals. Avoid all processed foods. Meats and other proteins Fatty cuts of meat. Poultry with skin. Breaded or fried meats. Processed meat. Avoid saturated fats. Dairy Full-fat yogurt, cheese, or milk. Beverages Sweetened drinks, such as soda or iced tea. The items listed above may not be a complete list of foods and beverages you should avoid. Contact a dietitian for more information. Questions to ask a health care provider Do I need to meet with a certified diabetes care and education specialist? Do I need to meet with a dietitian? What number can I call if I have questions? When are the best times to check my blood glucose? Where to find more information: American Diabetes Association: diabetes.org Academy of Nutrition and Dietetics: eatright.[employer] of Diabetes and Digestive and Kidney Diseases: StageSync.si Association of Diabetes Care & Education Specialists: diabeteseducator.org Summary It is important to have healthy eating habits because your blood sugar (glucose) levels are greatly affected by what you eat and drink. It is important to use alcohol carefully. A healthy meal plan will help you  manage your blood glucose and lower your risk of heart disease. Your health care provider may recommend that you work with a dietitian to make a meal plan that is best for you. This information is not intended to replace advice given to you by your health care provider. Make sure you discuss any questions you have with your health care provider. Document Revised: 12/12/2019 Document Reviewed: 12/12/2019 Elsevier Patient Education  [place].

## 2022-01-15 NOTE — Assessment & Plan Note (Signed)
HgA1C 7.3 in 11/2021, which is appropriate given his comorbilities. Continue Jardiance 10 mg daily. Annual eye exam, periodic dental and foot care recommended. F/U in 3 months.

## 2022-01-18 ENCOUNTER — Ambulatory Visit (INDEPENDENT_AMBULATORY_CARE_PROVIDER_SITE_OTHER): Payer: Medicare Other

## 2022-01-18 ENCOUNTER — Telehealth: Payer: Self-pay | Admitting: Family Medicine

## 2022-01-18 VITALS — Ht 69.0 in | Wt 148.0 lb

## 2022-01-18 DIAGNOSIS — Z1211 Encounter for screening for malignant neoplasm of colon: Secondary | ICD-10-CM

## 2022-01-18 DIAGNOSIS — I130 Hypertensive heart and chronic kidney disease with heart failure and stage 1 through stage 4 chronic kidney disease, or unspecified chronic kidney disease: Secondary | ICD-10-CM | POA: Diagnosis not present

## 2022-01-18 DIAGNOSIS — Z91128 Patient's intentional underdosing of medication regimen for other reason: Secondary | ICD-10-CM | POA: Diagnosis not present

## 2022-01-18 DIAGNOSIS — E1165 Type 2 diabetes mellitus with hyperglycemia: Secondary | ICD-10-CM | POA: Diagnosis not present

## 2022-01-18 DIAGNOSIS — E1122 Type 2 diabetes mellitus with diabetic chronic kidney disease: Secondary | ICD-10-CM | POA: Diagnosis not present

## 2022-01-18 DIAGNOSIS — Z0000 Encounter for general adult medical examination without abnormal findings: Secondary | ICD-10-CM

## 2022-01-18 DIAGNOSIS — E8582 Wild-type transthyretin-related (ATTR) amyloidosis: Secondary | ICD-10-CM | POA: Diagnosis not present

## 2022-01-18 DIAGNOSIS — N189 Chronic kidney disease, unspecified: Secondary | ICD-10-CM | POA: Diagnosis not present

## 2022-01-18 DIAGNOSIS — Z87891 Personal history of nicotine dependence: Secondary | ICD-10-CM | POA: Diagnosis not present

## 2022-01-18 DIAGNOSIS — W19XXXD Unspecified fall, subsequent encounter: Secondary | ICD-10-CM | POA: Diagnosis not present

## 2022-01-18 DIAGNOSIS — I252 Old myocardial infarction: Secondary | ICD-10-CM | POA: Diagnosis not present

## 2022-01-18 DIAGNOSIS — I2510 Atherosclerotic heart disease of native coronary artery without angina pectoris: Secondary | ICD-10-CM | POA: Diagnosis not present

## 2022-01-18 DIAGNOSIS — Z9181 History of falling: Secondary | ICD-10-CM | POA: Diagnosis not present

## 2022-01-18 DIAGNOSIS — Z8673 Personal history of transient ischemic attack (TIA), and cerebral infarction without residual deficits: Secondary | ICD-10-CM | POA: Diagnosis not present

## 2022-01-18 DIAGNOSIS — I10 Essential (primary) hypertension: Secondary | ICD-10-CM | POA: Diagnosis not present

## 2022-01-18 DIAGNOSIS — R079 Chest pain, unspecified: Secondary | ICD-10-CM | POA: Diagnosis not present

## 2022-01-18 DIAGNOSIS — I5023 Acute on chronic systolic (congestive) heart failure: Secondary | ICD-10-CM | POA: Diagnosis not present

## 2022-01-18 DIAGNOSIS — E1140 Type 2 diabetes mellitus with diabetic neuropathy, unspecified: Secondary | ICD-10-CM | POA: Diagnosis not present

## 2022-01-18 DIAGNOSIS — Z7982 Long term (current) use of aspirin: Secondary | ICD-10-CM | POA: Diagnosis not present

## 2022-01-18 NOTE — Progress Notes (Signed)
Subjective:   Thomas West is a 61 y.o. male who presents for Medicare Annual/Subsequent preventive examination.  Review of Systems    Virtual Visit via Telephone Note  I connected with  Thomas West on 01/18/22 at  3:30 PM EDT by telephone and verified that I am speaking with the correct person using two identifiers.  Location: Patient: Home Provider: Office Persons participating in the virtual visit: patient/Nurse Health Advisor   I discussed the limitations, risks, security and privacy concerns of performing an evaluation and management service by telephone and the availability of in person appointments. The patient expressed understanding and agreed to proceed.  Interactive audio and video telecommunications were attempted between this nurse and patient, however failed, due to patient having technical difficulties OR patient did not have access to video capability.  We continued and completed visit with audio only.  Some vital signs may be absent or patient reported.   [name], LPN  Cardiac Risk Factors include: advanced age (>78men, >26 women);diabetes mellitus;male gender     Objective:    Today's Vitals   01/18/22 1541 01/18/22 1542  Weight: 148 lb (67.1 kg)   Height: 5\' 9"  (1.753 m)   PainSc:  0-No pain   Body mass index is 21.86 kg/m.     01/18/2022    4:03 PM 12/24/2021    5:23 PM 12/13/2021   10:49 PM 12/07/2021    7:41 PM 11/10/2021   11:07 AM 10/05/2021    1:47 AM 08/03/2021    6:06 AM  Advanced Directives  Does Patient Have a Medical Advance Directive? No No No No No No No  Would patient like information on creating a medical advance directive? No - Patient declined No - Patient declined No - Patient declined No - Patient declined No - Patient declined No - Patient declined     Current Medications (verified) Outpatient Encounter Medications as of 01/18/2022  Medication Sig   aspirin EC 81 MG tablet Take 1 tablet (81 mg total) by mouth daily.  Swallow whole.   atorvastatin (LIPITOR) 80 MG tablet Take 1 tablet (80 mg total) by mouth daily.   carvedilol (COREG) 6.25 MG tablet Take 1 tablet (6.25 mg total) by mouth EVERY 12 HOURS (10AM &10PM)   clopidogrel (PLAVIX) 75 MG tablet Take 75 mg by mouth daily.   Continuous Blood Gluc Sensor (DEXCOM G6 SENSOR) MISC Change sensor q 10 days.   Continuous Blood Gluc Transmit (DEXCOM G6 TRANSMITTER) MISC 1 Device by Does not apply route every 3 (three) months.   cyanocobalamin 1000 MCG tablet Take 1 tablet (1,000 mcg total) by mouth daily.   empagliflozin (JARDIANCE) 10 MG TABS tablet Take 1 tablet (10 mg total) by mouth daily.   gabapentin (NEURONTIN) 400 MG capsule Take 400 mg by mouth daily.   hydrALAZINE (APRESOLINE) 50 MG tablet Take 1 tablet (50 mg total) by mouth 3 (three) times daily.   hydrocortisone cream 1 % Apply to affected area 2 times daily (Patient taking differently: Apply 1 Application topically See admin instructions. Apply to affected areas of the back 3 times a day)   isosorbide mononitrate (IMDUR) 30 MG 24 hr tablet Take 1 tablet (30 mg total) by mouth daily.   sacubitril-valsartan (ENTRESTO) 24-26 MG Take 1 tablet by mouth 2 (two) times daily.   senna-docusate (SENOKOT-S) 8.6-50 MG tablet Take 1 tablet by mouth at bedtime.   spironolactone (ALDACTONE) 25 MG tablet Take 1 tablet (25 mg total) by mouth daily.  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   torsemide (DEMADEX) 20 MG tablet Take 1 tablet (20 mg total) by mouth every Monday, Wednesday, and Friday. Take 1 tab extra for weight gain, 2-3lbs overnight and 5lbs in 1 week   No facility-administered encounter medications on file as of 01/18/2022.    Allergies (verified) Bee venom   History: Past Medical History:  Diagnosis Date   CHF (congestive heart failure) (HCC)    Coronary artery disease    Diabetes mellitus without complication (HCC)    History of MI (myocardial infarction) 03/05/2019    Hypertension    Stroke [facility])    Past Surgical History:  Procedure Laterality Date   leg surgery     "pins in left shin"   RIGHT/LEFT HEART CATH AND CORONARY ANGIOGRAPHY N/A 03/11/2021   Procedure: RIGHT/LEFT HEART CATH AND CORONARY ANGIOGRAPHY;  Surgeon: Orbie Pyo, MD;  Location: [facility] INVASIVE CV LAB;  Service: Cardiovascular;  Laterality: N/A;   History reviewed. No pertinent family history. Social History   Socioeconomic History   Marital status: Single    Spouse name: Not on file   Number of children: Not on file   Years of education: Not on file   Highest education level: Not on file  Occupational History   Not on file  Tobacco Use   Smoking status: Former    Types: Cigarettes    Quit date: 10/06/2017    Years since quitting: 4.2   Smokeless tobacco: Never  Substance and Sexual Activity   Alcohol use: Not Currently   Drug use: Yes    Types: Marijuana   Sexual activity: Not on file  Other Topics Concern   Not on file  Social History Narrative   Not on file   Social Determinants of Health   Financial Resource Strain: Low Risk  (01/18/2022)   Overall Financial Resource Strain (CARDIA)    Difficulty of Paying Living Expenses: Not hard at all  Food Insecurity: No Food Insecurity (01/18/2022)   Hunger Vital Sign    Worried About Running Out of Food in the Last Year: Never true    Ran Out of Food in the Last Year: Never true  Recent Concern: Food Insecurity - Food Insecurity Present (12/07/2021)   Hunger Vital Sign    Worried About Running Out of Food in the Last Year: Sometimes true    Ran Out of Food in the Last Year: Never true  Transportation Needs: No Transportation Needs (01/18/2022)   PRAPARE - Administrator, Civil Service (Medical): No    Lack of Transportation (Non-Medical): No  Recent Concern: Transportation Needs - Unmet Transportation Needs (12/31/2021)   PRAPARE - Transportation    Lack of Transportation (Medical): Yes    Lack of  Transportation (Non-Medical): Yes  Physical Activity: Inactive (01/18/2022)   Exercise Vital Sign    Days of Exercise per Week: 0 days    Minutes of Exercise per Session: 0 min  Stress: Stress Concern Present (01/18/2022)   [employer] of Occupational Health - Occupational Stress Questionnaire    Feeling of Stress : To some extent  Social Connections: Socially Isolated (01/18/2022)   Social Connection and Isolation Panel [NHANES]    Frequency of Communication with Friends and Family: More than three times a week    Frequency of Social Gatherings with Friends and Family: More than three times a week    Attends Religious Services: Never    Production manager of [employer]  or Organizations: No    Attends Banker Meetings: Never    Marital Status: Never married    Tobacco Counseling Counseling given: Not Answered   Clinical Intake: Nutrition Risk Assessment:  Has the patient had any N/V/D within the last 2 months?  No  Does the patient have any non-healing wounds?  No  Has the patient had any unintentional weight loss or weight gain?  No   Diabetes:  Is the patient diabetic?  Yes  If diabetic, was a CBG obtained today?  No  Did the patient bring in their glucometer from home?  No  How often do you monitor your CBG's? 3 X Daily.   Financial Strains and Diabetes Management:  Are you having any financial strains with the device, your supplies or your medication? No .  Does the patient want to be seen by Chronic Care Management for management of their diabetes?  No  Would the patient like to be referred to a Nutritionist or for Diabetic Management?  No   Diabetic Exams:  Diabetic Eye Exam: Completed No. Overdue for diabetic eye exam. Pt has been advised about the importance in completing this exam. A referral has been placed today. Message sent to referral coordinator for scheduling purposes. Advised pt to expect a call from office referred to regarding appt.  Diabetic  Foot Exam: Completed No. Pt has been advised about the importance in completing this exam. Pt is scheduled for diabetic foot exam on Followed by PCP.   Pre-visit preparation completed: No  Diabetic?  YesInformation entered by :: Theresa Mulligan LPN   Activities of Daily Living    01/18/2022    3:56 PM 12/25/2021    4:00 PM  In your present state of health, do you have any difficulty performing the following activities:  Hearing? 0 0  Vision? 0 0  Difficulty concentrating or making decisions? 0 0  Walking or climbing stairs? 1 1  Comment Uses a cane and walker as needed   Dressing or bathing? 0 0  Doing errands, shopping? 0 1  Preparing Food and eating ? N   Using the Toilet? N   In the past six months, have you accidently leaked urine? N   Do you have problems with loss of bowel control? N   Managing your Medications? N   Managing your Finances? N   Housekeeping or managing your Housekeeping? N     Patient Care Team: [name], [name], MD as PCP - General (Family Medicine) Wendall Stade, MD as PCP - Cardiology (Cardiology)  Indicate any recent Medical Services you may have received from other than [facility] providers in the past year (date may be approximate).     Assessment:   This is a routine wellness examination for [place].  Hearing/Vision screen Hearing Screening - Comments:: Denies hearing difficulties   Vision Screening - Comments:: Wears glasses. Followed by [facility]  Dietary issues and exercise activities discussed: Current Exercise Habits: The patient does not participate in regular exercise at present, Exercise limited by: None identified   Goals Addressed               This Visit's Progress     Patient stated (pt-stated)        Get back to my regular routine.       Depression Screen    01/18/2022    3:52 PM 01/15/2022   11:50 AM 02/03/2021    8:25 AM  PHQ 2/9 Scores  PHQ -  2 Score 0 3 0  PHQ- 9 Score 0 10     Fall Risk    01/18/2022     3:59 PM 01/15/2022   11:50 AM  Fall Risk   Falls in the past year? 1 1  Number falls in past yr: 0 0  Injury with Fall? 1 1  Comment Back condition. Followed by PCP   Risk for fall due to :  History of fall(s)  Follow up Education provided Falls evaluation completed    FALL RISK PREVENTION PERTAINING TO THE HOME:  Any stairs in or around the home? Yes  If so, are there any without handrails? No  Home free of loose throw rugs in walkways, pet beds, electrical cords, etc? Yes  Adequate lighting in your home to reduce risk of falls? Yes   ASSISTIVE DEVICES UTILIZED TO PREVENT FALLS:  Life alert? No  Use of a cane, walker or w/c? Yes  Grab bars in the bathroom? No  Shower chair or bench in shower? No  Elevated toilet seat or a handicapped toilet? No   TIMED UP AND GO:  Was the test performed? No . Audio Visit  Cognitive Function:        01/18/2022    4:03 PM  6CIT Screen  What Year? 0 points  What month? 0 points  What time? 0 points  Count back from 20 0 points  Months in reverse 0 points  Repeat phrase 0 points  Total Score 0 points    Immunizations  There is no immunization history on file for this patient.  TDAP status: Due, Education has been provided regarding the importance of this vaccine. Advised may receive this vaccine at local pharmacy or Health Dept. Aware to provide a copy of the vaccination record if obtained from local pharmacy or Health Dept. Verbalized acceptance and understanding.  Flu Vaccine status: Declined, Education has been provided regarding the importance of this vaccine but patient still declined. Advised may receive this vaccine at local pharmacy or Health Dept. Aware to provide a copy of the vaccination record if obtained from local pharmacy or Health Dept. Verbalized acceptance and understanding.    Covid-19 vaccine status: Completed vaccines  Qualifies for Shingles Vaccine? Yes   Zostavax completed No   Shingrix Completed?: No.     Education has been provided regarding the importance of this vaccine. Patient has been advised to call insurance company to determine out of pocket expense if they have not yet received this vaccine. Advised may also receive vaccine at local pharmacy or Health Dept. Verbalized acceptance and understanding.  Screening Tests Health Maintenance  Topic Date Due   OPHTHALMOLOGY EXAM  Never done   COLONOSCOPY (Pts 45-203yrs Insurance coverage will need to be confirmed)  Never done   INFLUENZA VACCINE  12/22/2021   TETANUS/TDAP  02/03/2022 (Originally 04/01/1980)   COVID-19 Vaccine (1) 02/03/2022 (Originally 09/30/1961)   Zoster Vaccines- Shingrix (1 of 2) 04/20/2022 (Originally 04/01/2013)   FOOT EXAM  02/03/2022   HEMOGLOBIN A1C  06/15/2022   Hepatitis C Screening  Completed   HIV Screening  Completed   HPV VACCINES  Aged Out    Health Maintenance  Health Maintenance Due  Topic Date Due   OPHTHALMOLOGY EXAM  Never done   COLONOSCOPY (Pts 45-683yrs Insurance coverage will need to be confirmed)  Never done   INFLUENZA VACCINE  12/22/2021    Colorectal cancer screening: Referral to GI placed 01/18/22. Pt aware the office will call re: appt.  Lung Cancer Screening: (Low Dose CT Chest recommended if Age 37-80 years, 30 pack-year currently smoking OR have quit w/in 15years.) does not qualify.     Additional Screening:  Hepatitis C Screening: does qualify; Completed 02/03/21  Vision Screening: Recommended annual ophthalmology exams for early detection of glaucoma and other disorders of the eye. Is the patient up to date with their annual eye exam?  No  Who is the provider or what is the name of the office in which the patient attends annual eye exams? [facility] If pt is not established with a provider, would they like to be referred to a provider to establish care? [facility]  Dental Screening: Recommended annual dental exams for proper oral hygiene  Community Resource Referral /  Chronic Care Management:   CRR required this visit?  No   CCM required this visit?  No      Plan:     I have personally reviewed and noted the following in the patient's chart:   Medical and social history Use of alcohol, tobacco or illicit drugs  Current medications and supplements including opioid prescriptions. Patient is not currently taking opioid prescriptions. Functional ability and status Nutritional status Physical activity Advanced directives List of other physicians Hospitalizations, surgeries, and ER visits in previous 12 months Vitals Screenings to include cognitive, depression, and falls Referrals and appointments  In addition, I have reviewed and discussed with patient certain preventive protocols, quality metrics, and best practice recommendations. A written personalized care plan for preventive services as well as general preventive health recommendations were provided to patient.     [name], LPN   [id number]   Nurse Notes: None

## 2022-01-18 NOTE — Telephone Encounter (Signed)
Pt also had c/o chest tightness, pain going into his jaw, toothaches as well. 9-1-1 was called, dispatcher instructed her to give him 3 baby aspirin, pt felt better. Last   170/100. Pt declined transport.

## 2022-01-18 NOTE — Telephone Encounter (Signed)
Called back

## 2022-01-18 NOTE — Patient Instructions (Addendum)
[name] , Thank you for taking time to come for your Medicare Wellness Visit. I appreciate your ongoing commitment to your health goals. Please review the following plan we discussed and let me know if I can assist you in the future.   Screening recommendations/referrals: Colonoscopy: Referral to GI placed 01/18/22 Recommended yearly ophthalmology/optometry visit for glaucoma screening and checkup Recommended yearly dental visit for hygiene and checkup  Vaccinations: Influenza vaccine: Deferred  Tdap vaccine: Deferred Shingles vaccine: Deferred   Covid-19: Done  Advanced directives:  Advance directive discussed with you today. Even though you declined this today, please call our office should you change your mind, and we can give you the proper paperwork for you to fill out.   Conditions/risks identified: None  Next appointment: Follow up in one year for your annual wellness visit    Preventive Care 40-64 Years, Male Preventive care refers to lifestyle choices and visits with your health care provider that can promote health and wellness. What does preventive care include? A yearly physical exam. This is also called an annual well check. Dental exams once or twice a year. Routine eye exams. Ask your health care provider how often you should have your eyes checked. Personal lifestyle choices, including: Daily care of your teeth and gums. Regular physical activity. Eating a healthy diet. Avoiding tobacco and drug use. Limiting alcohol use. Practicing safe sex. Taking low-dose aspirin every day starting at age 49. What happens during an annual well check? The services and screenings done by your health care provider during your annual well check will depend on your age, overall health, lifestyle risk factors, and family history of disease. Counseling  Your health care provider may ask you questions about your: Alcohol use. Tobacco use. Drug use. Emotional well-being. Home  and relationship well-being. Sexual activity. Eating habits. Work and work Astronomer. Screening  You may have the following tests or measurements: Height, weight, and BMI. Blood pressure. Lipid and cholesterol levels. These may be checked every 5 years, or more frequently if you are over 60 years old. Skin check. Lung cancer screening. You may have this screening every year starting at age 80 if you have a 30-pack-year history of smoking and currently smoke or have quit within the past 15 years. Fecal occult blood test (FOBT) of the stool. You may have this test every year starting at age 70. Flexible sigmoidoscopy or colonoscopy. You may have a sigmoidoscopy every 5 years or a colonoscopy every 10 years starting at age 24. Prostate cancer screening. Recommendations will vary depending on your family history and other risks. Hepatitis C blood test. Hepatitis B blood test. Sexually transmitted disease (STD) testing. Diabetes screening. This is done by checking your blood sugar (glucose) after you have not eaten for a while (fasting). You may have this done every 1-3 years. Discuss your test results, treatment options, and if necessary, the need for more tests with your health care provider. Vaccines  Your health care provider may recommend certain vaccines, such as: Influenza vaccine. This is recommended every year. Tetanus, diphtheria, and acellular pertussis (Tdap, Td) vaccine. You may need a Td booster every 10 years. Zoster vaccine. You may need this after age 58. Pneumococcal 13-valent conjugate (PCV13) vaccine. You may need this if you have certain conditions and have not been vaccinated. Pneumococcal polysaccharide (PPSV23) vaccine. You may need one or two doses if you smoke cigarettes or if you have certain conditions. Talk to your health care provider about which screenings and vaccines  you need and how often you need them. This information is not intended to replace advice given  to you by your health care provider. Make sure you discuss any questions you have with your health care provider. Document Released: 06/06/2015 Document Revised: 01/28/2016 Document Reviewed: 03/11/2015 Elsevier Interactive Patient Education  2017 [employer].  Fall Prevention in the Home Falls can cause injuries. They can happen to people of all ages. There are many things you can do to make your home safe and to help prevent falls. What can I do on the outside of my home? Regularly fix the edges of walkways and driveways and fix any cracks. Remove anything that might make you trip as you walk through a door, such as a raised step or threshold. Trim any bushes or trees on the path to your home. Use bright outdoor lighting. Clear any walking paths of anything that might make someone trip, such as rocks or tools. Regularly check to see if handrails are loose or broken. Make sure that both sides of any steps have handrails. Any raised decks and porches should have guardrails on the edges. Have any leaves, snow, or ice cleared regularly. Use sand or salt on walking paths during winter. Clean up any spills in your garage right away. This includes oil or grease spills. What can I do in the bathroom? Use night lights. Install grab bars by the toilet and in the tub and shower. Do not use towel bars as grab bars. Use non-skid mats or decals in the tub or shower. If you need to sit down in the shower, use a plastic, non-slip stool. Keep the floor dry. Clean up any water that spills on the floor as soon as it happens. Remove soap buildup in the tub or shower regularly. Attach bath mats securely with double-sided non-slip rug tape. Do not have throw rugs and other things on the floor that can make you trip. What can I do in the bedroom? Use night lights. Make sure that you have a light by your bed that is easy to reach. Do not use any sheets or blankets that are too big for your bed. They should  not hang down onto the floor. Have a firm chair that has side arms. You can use this for support while you get dressed. Do not have throw rugs and other things on the floor that can make you trip. What can I do in the kitchen? Clean up any spills right away. Avoid walking on wet floors. Keep items that you use a lot in easy-to-reach places. If you need to reach something above you, use a strong step stool that has a grab bar. Keep electrical cords out of the way. Do not use floor polish or wax that makes floors slippery. If you must use wax, use non-skid floor wax. Do not have throw rugs and other things on the floor that can make you trip. What can I do with my stairs? Do not leave any items on the stairs. Make sure that there are handrails on both sides of the stairs and use them. Fix handrails that are broken or loose. Make sure that handrails are as long as the stairways. Check any carpeting to make sure that it is firmly attached to the stairs. Fix any carpet that is loose or worn. Avoid having throw rugs at the top or bottom of the stairs. If you do have throw rugs, attach them to the floor with carpet tape. Make sure  that you have a light switch at the top of the stairs and the bottom of the stairs. If you do not have them, ask someone to add them for you. What else can I do to help prevent falls? Wear shoes that: Do not have high heels. Have rubber bottoms. Are comfortable and fit you well. Are closed at the toe. Do not wear sandals. If you use a stepladder: Make sure that it is fully opened. Do not climb a closed stepladder. Make sure that both sides of the stepladder are locked into place. Ask someone to hold it for you, if possible. Clearly mark and make sure that you can see: Any grab bars or handrails. First and last steps. Where the edge of each step is. Use tools that help you move around (mobility aids) if they are needed. These  include: Canes. Walkers. Scooters. Crutches. Turn on the lights when you go into a dark area. Replace any light bulbs as soon as they burn out. Set up your furniture so you have a clear path. Avoid moving your furniture around. If any of your floors are uneven, fix them. If there are any pets around you, be aware of where they are. Review your medicines with your doctor. Some medicines can make you feel dizzy. This can increase your chance of falling. Ask your doctor what other things that you can do to help prevent falls. This information is not intended to replace advice given to you by your health care provider. Make sure you discuss any questions you have with your health care provider. Document Released: 03/06/2009 Document Revised: 10/16/2015 Document Reviewed: 06/14/2014 Elsevier Interactive Patient Education  2017 [name].

## 2022-01-18 NOTE — Telephone Encounter (Signed)
Elevated BP 200/108. Just took medication and will recheck. Advised patient to rest and elevate.

## 2022-01-19 ENCOUNTER — Telehealth: Payer: Self-pay | Admitting: Family Medicine

## 2022-01-19 ENCOUNTER — Telehealth (HOSPITAL_COMMUNITY): Payer: Self-pay | Admitting: Emergency Medicine

## 2022-01-19 MED ORDER — DEXCOM G7 RECEIVER DEVI: 3 refills | Status: DC

## 2022-01-19 MED ORDER — DEXCOM G7 SENSOR MISC: 3 refills | Status: DC

## 2022-01-19 NOTE — Telephone Encounter (Signed)
Called to schedule home visit.  No answer and left no message as voicemail has not yet been set up.

## 2022-01-19 NOTE — Telephone Encounter (Signed)
Pt states the pharmacy told him he needed a prescription for Dexcom D7 and not the Continuous Blood Gluc Sensor (DEXCOM G6 SENSOR) MISC  [facility] [place], [place] - [address] Phone:  [phone]  Fax:  [phone]

## 2022-01-19 NOTE — Telephone Encounter (Signed)
Called Thomas West for appointment @ 16:17 No answer.  Has a voicemail that has not been set up yet.

## 2022-01-19 NOTE — Telephone Encounter (Signed)
I tried calling patient to see how BP's have been today, unable to leave a voicemail.

## 2022-01-20 ENCOUNTER — Other Ambulatory Visit (HOSPITAL_COMMUNITY): Payer: Self-pay | Admitting: Emergency Medicine

## 2022-01-20 ENCOUNTER — Other Ambulatory Visit (HOSPITAL_COMMUNITY): Payer: Self-pay | Admitting: *Deleted

## 2022-01-20 MED ORDER — CLOPIDOGREL BISULFATE 75 MG PO TABS: 75.0000 mg | ORAL_TABLET | Freq: Every day | ORAL | 3 refills | Status: DC

## 2022-01-20 MED ORDER — SACUBITRIL-VALSARTAN 24-26 MG PO TABS: 1.0000 | ORAL_TABLET | Freq: Two times a day (BID) | ORAL | 3 refills | Status: DC

## 2022-01-20 NOTE — Progress Notes (Signed)
Paramedicine Encounter    Patient ID: Thomas West, male    DOB: 1960/08/25, 61 y.o.   MRN: [id number]   BP 118/78 (BP Location: Left Arm, Patient Position: Sitting, Cuff Size: Normal)   Pulse 85   Resp 16   Wt 147 lb 12.8 oz (67 kg)   SpO2 96%   BMI 21.83 kg/m  Weight yesterday-not taken Last visit weight-145lb  Home visit with Thomas West was not scheduled.  I have been trying to reach him by phone and he has not been answering and does not have a voicemail set up on his phone so I haven't been able to leave a message.  I found Thomas West to be home today. He denies chest pain or SOB.  Lung sounds clear and equal bilat.  No edema noted.  No abdominal distention and no obvious JVD.  Pill box was filled but not sure by who.  Reviewed for accuracy and found to be in order.  Refills for Clopidogril and Entresto 24/26 requested from [facility].  Requesting refills for Cyancobalamin (B12) and Senna docusate  from PCP.  These refills to be called into [facility].  Also pt. Advised he was to get a new prescription for DexCom 7 glucose meter.  I reached out to the pharmacy and they advised it was ready and would be delivered later today.  Home visit complete.    Beatrix Shipper, EMT-Paramedic [phone] 01/20/2022   Patient Care Team: [name], [name], MD as PCP - General (Family Medicine) Wendall Stade, MD as PCP - Cardiology (Cardiology)  Patient Active Problem List   Diagnosis Date Noted   Polyneuropathy associated with underlying disease (HCC) 01/15/2022   Atherosclerosis of aorta (HCC) 01/15/2022   History of CVA (cerebrovascular accident) without residual deficits 12/24/2021   AKI (acute kidney injury) (HCC) 12/13/2021   Diarrhea 12/13/2021   Shortness of breath 12/13/2021   Chest pain 12/13/2021   Nausea and vomiting 12/13/2021   Heart failure (HCC) 12/08/2021   Hypertension associated with diabetes (HCC) 07/12/2021   Chronic kidney disease, stage 3a (HCC) 07/12/2021    Hyperkalemia 07/12/2021   CHF exacerbation (HCC) 04/20/2021   Acute on chronic combined systolic and diastolic CHF (congestive heart failure) (HCC) 03/09/2021   Acute on chronic HFrEF (heart failure with reduced ejection fraction) (HCC) 03/08/2021   HFrEF (heart failure with reduced ejection fraction) (HCC) 02/03/2021   Diabetes mellitus (HCC) 03/27/2019   Hyperlipidemia associated with type 2 diabetes mellitus (HCC) 03/27/2019   CAD (coronary artery disease) 03/05/2019   History of MI (myocardial infarction) 03/05/2019   Type 2 diabetes mellitus with diabetic neuropathy, unspecified (HCC) 03/05/2019   HLD (hyperlipidemia) 03/05/2019   GERD (gastroesophageal reflux disease) 03/05/2019   Hypertension, essential, benign 03/05/2019    Current Outpatient Medications:    aspirin EC 81 MG tablet, Take 1 tablet (81 mg total) by mouth daily. Swallow whole., Disp: 30 tablet, Rfl: 12   atorvastatin (LIPITOR) 80 MG tablet, Take 1 tablet (80 mg total) by mouth daily., Disp: 30 tablet, Rfl: 6   carvedilol (COREG) 6.25 MG tablet, Take 1 tablet (6.25 mg total) by mouth EVERY 12 HOURS (10AM &10PM), Disp: 60 tablet, Rfl: 6   cyanocobalamin 1000 MCG tablet, Take 1 tablet (1,000 mcg total) by mouth daily., Disp: 30 tablet, Rfl: 0   empagliflozin (JARDIANCE) 10 MG TABS tablet, Take 1 tablet (10 mg total) by mouth daily., Disp: 30 tablet, Rfl: 0   hydrALAZINE (APRESOLINE) 50 MG tablet, Take 1  tablet (50 mg total) by mouth 3 (three) times daily., Disp: 90 tablet, Rfl: 6   hydrocortisone cream 1 %, Apply to affected area 2 times daily (Patient taking differently: Apply 1 Application topically See admin instructions. 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 Take 1 tab extra for weight gain, 2-3lbs overnight and 5lbs in 1 week, Disp: 30 tablet, Rfl: 0   clopidogrel (PLAVIX) 75 MG tablet, Take 1 tablet (75 mg total) by mouth daily., Disp: 30 tablet, Rfl: 3   Continuous Blood Gluc Receiver (DEXCOM G7 RECEIVER) DEVI, Use to check blood sugars (Patient not taking: Reported on 01/20/2022), Disp: 3 each, Rfl: 3   Continuous Blood Gluc Sensor ([place]) MISC, Use to check blood sugars (Patient not taking: Reported on 01/20/2022), Disp: 3 each, Rfl: 3   gabapentin (NEURONTIN) 400 MG capsule, Take 400 mg by mouth daily. (Patient not taking: Reported on 01/20/2022), Disp: , Rfl:    sacubitril-valsartan (ENTRESTO) 24-26 MG, Take 1 tablet by mouth 2 (two) times daily., Disp: 60 tablet, Rfl: 3 Allergies  Allergen Reactions   Bee Venom Anaphylaxis, Swelling and Other (See Comments)    Swells all over      Social History   Socioeconomic History   Marital status: Single    Spouse name: Not on file   Number of children: Not on file   Years of education: Not on file   Highest education level: Not on file  Occupational History   Not on file  Tobacco Use   Smoking status: Former    Types: Cigarettes    Quit date: 10/06/2017    Years since quitting: 4.2   Smokeless tobacco: Never  Substance and Sexual Activity   Alcohol use: Not Currently   Drug use: Yes    Types: Marijuana   Sexual activity: Not on file  Other Topics Concern   Not on file  Social History Narrative   Not on file   Social Determinants of Health   Financial Resource Strain: Low Risk  (01/18/2022)   Overall Financial Resource Strain (CARDIA)    Difficulty of Paying Living Expenses:  Not hard at all  Food Insecurity: No Food Insecurity (01/18/2022)   Hunger Vital Sign    Worried About Running Out of Food in the Last Year: Never true    Ran Out of Food in the Last Year: Never true  Recent Concern: Food Insecurity - Food Insecurity Present (12/07/2021)   Hunger Vital Sign    Worried About Running Out of Food in the Last Year: Sometimes true    Ran Out of Food in the Last Year: Never true  Transportation Needs: No Transportation Needs (01/18/2022)   PRAPARE - Hydrologist (Medical): No    Lack of Transportation (Non-Medical): No  Recent Concern: Transportation Needs - Unmet Transportation Needs (12/31/2021)  PRAPARE - Administrator, Civil Service (Medical): Yes    Lack of Transportation (Non-Medical): Yes  Physical Activity: Inactive (01/18/2022)   Exercise Vital Sign    Days of Exercise per Week: 0 days    Minutes of Exercise per Session: 0 min  Stress: Stress Concern Present (01/18/2022)   [employer] of Occupational Health - Occupational Stress Questionnaire    Feeling of Stress : To some extent  Social Connections: Socially Isolated (01/18/2022)   Social Connection and Isolation Panel [NHANES]    Frequency of Communication with Friends and Family: More than three times a week    Frequency of Social Gatherings with Friends and Family: More than three times a week    Attends Religious Services: Never    Database administrator or Organizations: No    Attends Banker Meetings: Never    Marital Status: Never married  Intimate Partner Violence: Not At Risk (01/18/2022)   Humiliation, Afraid, Rape, and Kick questionnaire    Fear of Current or Ex-Partner: No    Emotionally Abused: No    Physically Abused: No    Sexually Abused: No    Physical Exam      Future Appointments  Date Time Provider Department Center  04/08/2022 10:20 AM Bensimhon, Bevelyn Buckles, MD [facility] None       Beatrix Shipper,  EMT-Paramedic [phone] [facility] Paramedic  01/20/22

## 2022-01-20 NOTE — Chronic Care Management (AMB) (Signed)
  Care Coordination  Outreach Note  01/20/2022 Name: Zadkiel Dragan MRN: [id number] DOB: 1961/01/31   Care Coordination Outreach Attempts  A second unsuccessful outreach was attempted today to offer the patient with information about available care coordination services as a benefit of their health plan.     Follow Up Plan:  Additional outreach attempts will be made to offer the patient care coordination information and services.   Encounter Outcome:  No Answer  Gwenevere Ghazi  Care Coordination Care Guide  Direct Dial: [phone]

## 2022-01-22 ENCOUNTER — Other Ambulatory Visit (HOSPITAL_COMMUNITY): Payer: Self-pay

## 2022-01-26 ENCOUNTER — Other Ambulatory Visit (HOSPITAL_COMMUNITY): Payer: Self-pay

## 2022-01-26 DIAGNOSIS — Z91128 Patient's intentional underdosing of medication regimen for other reason: Secondary | ICD-10-CM | POA: Diagnosis not present

## 2022-01-26 DIAGNOSIS — N189 Chronic kidney disease, unspecified: Secondary | ICD-10-CM | POA: Diagnosis not present

## 2022-01-26 DIAGNOSIS — I2510 Atherosclerotic heart disease of native coronary artery without angina pectoris: Secondary | ICD-10-CM | POA: Diagnosis not present

## 2022-01-26 DIAGNOSIS — W19XXXD Unspecified fall, subsequent encounter: Secondary | ICD-10-CM | POA: Diagnosis not present

## 2022-01-26 DIAGNOSIS — Z9181 History of falling: Secondary | ICD-10-CM | POA: Diagnosis not present

## 2022-01-26 DIAGNOSIS — E8582 Wild-type transthyretin-related (ATTR) amyloidosis: Secondary | ICD-10-CM | POA: Diagnosis not present

## 2022-01-26 DIAGNOSIS — E1122 Type 2 diabetes mellitus with diabetic chronic kidney disease: Secondary | ICD-10-CM | POA: Diagnosis not present

## 2022-01-26 DIAGNOSIS — I5023 Acute on chronic systolic (congestive) heart failure: Secondary | ICD-10-CM | POA: Diagnosis not present

## 2022-01-26 DIAGNOSIS — I130 Hypertensive heart and chronic kidney disease with heart failure and stage 1 through stage 4 chronic kidney disease, or unspecified chronic kidney disease: Secondary | ICD-10-CM | POA: Diagnosis not present

## 2022-01-26 DIAGNOSIS — I252 Old myocardial infarction: Secondary | ICD-10-CM | POA: Diagnosis not present

## 2022-01-26 DIAGNOSIS — E1165 Type 2 diabetes mellitus with hyperglycemia: Secondary | ICD-10-CM | POA: Diagnosis not present

## 2022-01-26 DIAGNOSIS — Z87891 Personal history of nicotine dependence: Secondary | ICD-10-CM | POA: Diagnosis not present

## 2022-01-26 DIAGNOSIS — E1140 Type 2 diabetes mellitus with diabetic neuropathy, unspecified: Secondary | ICD-10-CM | POA: Diagnosis not present

## 2022-01-26 DIAGNOSIS — Z7982 Long term (current) use of aspirin: Secondary | ICD-10-CM | POA: Diagnosis not present

## 2022-01-26 DIAGNOSIS — Z8673 Personal history of transient ischemic attack (TIA), and cerebral infarction without residual deficits: Secondary | ICD-10-CM | POA: Diagnosis not present

## 2022-01-26 NOTE — Chronic Care Management (AMB) (Signed)
  Care Coordination   Note   01/26/2022 Name: Thomas West MRN: [id number] DOB: 01/18/1961  Thomas West is a 61 y.o. year old male who sees [name], [name] Expose, MD for primary care. I reached out to [name] by phone today to reschedule follow up call with [facility] for care coordination services.    Follow up plan:  Telephone appointment with care coordination team member scheduled for:  01/28/22  Encounter Outcome:  Pt. Scheduled  [facility] Coordination Care Guide  Direct Dial: [phone]

## 2022-01-27 ENCOUNTER — Other Ambulatory Visit: Payer: Self-pay | Admitting: Family Medicine

## 2022-01-27 ENCOUNTER — Other Ambulatory Visit (HOSPITAL_COMMUNITY): Payer: Self-pay

## 2022-01-27 ENCOUNTER — Other Ambulatory Visit (HOSPITAL_COMMUNITY): Payer: Self-pay | Admitting: Emergency Medicine

## 2022-01-27 ENCOUNTER — Other Ambulatory Visit (HOSPITAL_COMMUNITY): Payer: Self-pay | Admitting: Internal Medicine

## 2022-01-27 DIAGNOSIS — E1169 Type 2 diabetes mellitus with other specified complication: Secondary | ICD-10-CM

## 2022-01-27 NOTE — Progress Notes (Signed)
Paramedicine Encounter    Patient ID: Thomas West, male    DOB: June 29, 1960, 61 y.o.   MRN: [id number]   BP (!) 180/100 (BP Location: Left Arm, Patient Position: Sitting, Cuff Size: Normal)   Pulse 80   Wt 153 lb (69.4 kg)   SpO2 96%   BMI 22.59 kg/m  Weight yesterday-not taken Last visit weight-147lb   [name] A&O x 4, skin W&D w/ good color.  Pt denies chest pain or SOB.  Lung sounds clear and equal bilat.  No edema noted.  When beginning to do med reconciliation discovered that the refills that were called into [facility] on 01/20/22 had not been filled or delivered.  Pt states that this isn't the first time he's had issues with the pharmacy.  Able to pick up the following meds at [facility]: Atorvastatin, Carvedilol, Hydralazine and Spironolactone.  Will pick up Torsemide, Jardiance and Isosorbide tomorrow to complete reconciliation.   Thomas West is missing his bottle of Isosorbide and it will not be eligible to be refilled till 02/04/22.   Thomas West has been telling me "some lady" has been doing home visits.  This person called while I was with him.  I spoke with her and found out her name was Morrie Sheldon LPN with [facility].  She was planning a visit with him for this afternoon and she said since I was there she would plan on coming another day.  My plan is to reach out to [facility] and coordinate visits with them and myself.  I will request that I be in charge of his meds as to be able to monitor him in with his heart failure meds.   Pt is up 6 lbs and hypertensive.  He is not symptomatic of these vitals currently.  He has not taken his afternoon meds.  I advised him that I would reach out to the [facility] for possible med changes. Home visit complete @ 19:30.    Beatrix Shipper, EMT-Paramedic [phone] 01/27/2022  Patient Care Team: [name], [name], MD as PCP - General (Family Medicine) Wendall Stade, MD as PCP - Cardiology  (Cardiology)  Patient Active Problem List   Diagnosis Date Noted   Polyneuropathy associated with underlying disease (HCC) 01/15/2022   Atherosclerosis of aorta (HCC) 01/15/2022   History of CVA (cerebrovascular accident) without residual deficits 12/24/2021   AKI (acute kidney injury) (HCC) 12/13/2021   Diarrhea 12/13/2021   Shortness of breath 12/13/2021   Chest pain 12/13/2021   Nausea and vomiting 12/13/2021   Heart failure (HCC) 12/08/2021   Hypertension associated with diabetes (HCC) 07/12/2021   Chronic kidney disease, stage 3a (HCC) 07/12/2021   Hyperkalemia 07/12/2021   CHF exacerbation (HCC) 04/20/2021   Acute on chronic combined systolic and diastolic CHF (congestive heart failure) (HCC) 03/09/2021   Acute on chronic HFrEF (heart failure with reduced ejection fraction) (HCC) 03/08/2021   HFrEF (heart failure with reduced ejection fraction) (HCC) 02/03/2021   Diabetes mellitus (HCC) 03/27/2019   Hyperlipidemia associated with type 2 diabetes mellitus (HCC) 03/27/2019   CAD (coronary artery disease) 03/05/2019   History of MI (myocardial infarction) 03/05/2019   Type 2 diabetes mellitus with diabetic neuropathy, unspecified (HCC) 03/05/2019   HLD (hyperlipidemia) 03/05/2019   GERD (gastroesophageal reflux disease) 03/05/2019   Hypertension, essential, benign 03/05/2019    Current Outpatient Medications:    aspirin EC 81 MG tablet, Take 1 tablet (81 mg total) by mouth daily. Swallow whole.,  Disp: 30 tablet, Rfl: 12   atorvastatin (LIPITOR) 80 MG tablet, Take 1 tablet (80 mg total) by mouth daily., Disp: 30 tablet, Rfl: 6   carvedilol (COREG) 6.25 MG tablet, Take 1 tablet (6.25 mg total) by mouth EVERY 12 HOURS (10AM &10PM), Disp: 60 tablet, Rfl: 6   clopidogrel (PLAVIX) 75 MG tablet, Take 1 tablet (75 mg total) by mouth daily., Disp: 30 tablet, Rfl: 3   Continuous Blood Gluc Receiver (DEXCOM G7 RECEIVER) DEVI, Use to check blood sugars, Disp: 3 each, Rfl: 3   Continuous  Blood Gluc Sensor (DEXCOM G7 SENSOR) MISC, Use to check blood sugars, Disp: 3 each, Rfl: 3   hydrALAZINE (APRESOLINE) 50 MG tablet, Take 1 tablet (50 mg total) by mouth 3 (three) times daily., Disp: 90 tablet, Rfl: 6   hydrocortisone cream 1 %, Apply to affected area 2 times daily (Patient taking differently: Apply 1 Application topically See admin instructions. 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 (Patient not taking: Reported on 01/27/2022), Disp: 30 tablet, Rfl: 0   empagliflozin (JARDIANCE) 10 MG TABS tablet, Take 1 tablet (10 mg total) by mouth daily., Disp: 30 tablet, Rfl: 0   gabapentin (NEURONTIN) 400 MG capsule, Take 400 mg by mouth daily. (Patient not taking: Reported on 01/20/2022), Disp: , Rfl:    isosorbide mononitrate (IMDUR) 30 MG 24 hr tablet, Take 1 tablet (30 mg total) by mouth daily., Disp: 30 tablet, Rfl: 6   senna-docusate (SENOKOT-S) 8.6-50 MG tablet, Take 1 tablet by mouth at bedtime. (Patient not taking: Reported on 01/27/2022), Disp: 20 tablet, Rfl: 0   torsemide (DEMADEX) 20 MG tablet, Take 1 tablet by mouth every Monday, Wednesday, and Friday. Take 1 tab extra for weight gain, 2-3lbs overnight and 5lbs in 1 week, Disp: 30 tablet, Rfl: 0 Allergies  Allergen Reactions   Bee Venom Anaphylaxis, Swelling and Other (See Comments)    Swells all over      Social History   Socioeconomic History   Marital status: Single    Spouse name: Not on file   Number of children: Not on file   Years of education: Not on file   Highest education level: Not on file   Occupational History   Not on file  Tobacco Use   Smoking status: Former    Types: Cigarettes    Quit date: 10/06/2017    Years since quitting: 4.3   Smokeless tobacco: Never  Substance and Sexual Activity   Alcohol use: Not Currently   Drug use: Yes    Types: Marijuana   Sexual activity: Not on file  Other Topics Concern   Not on file  Social History Narrative   Not on file   Social Determinants of Health   Financial Resource Strain: Low Risk  (01/18/2022)   Overall Financial Resource Strain (CARDIA)    Difficulty of Paying Living Expenses: Not hard at all  Food Insecurity: No Food Insecurity (01/18/2022)   Hunger Vital Sign    Worried About Running Out of Food in  the Last Year: Never true    [place] in the Last Year: Never true  Recent Concern: Food Insecurity - Food Insecurity Present (12/07/2021)   Hunger Vital Sign    Worried About Running Out of Food in the Last Year: Sometimes true    Ran Out of Food in the Last Year: Never true  Transportation Needs: No Transportation Needs (01/18/2022)   PRAPARE - Hydrologist (Medical): No    Lack of Transportation (Non-Medical): No  Recent Concern: Transportation Needs - Unmet Transportation Needs (12/31/2021)   PRAPARE - Transportation    Lack of Transportation (Medical): Yes    Lack of Transportation (Non-Medical): Yes  Physical Activity: Inactive (01/18/2022)   Exercise Vital Sign    Days of Exercise per Week: 0 days    Minutes of Exercise per Session: 0 min  Stress: Stress Concern Present (01/18/2022)   [place]    Feeling of Stress : To some extent  Social Connections: Socially Isolated (01/18/2022)   Social Connection and Isolation Panel [NHANES]    Frequency of Communication with Friends and Family: More than three times a week    Frequency of Social Gatherings with Friends and Family: More than three times a week     Attends Religious Services: Never    Marine scientist or Organizations: No    Attends Archivist Meetings: Never    Marital Status: Never married  Intimate Partner Violence: Not At Risk (01/18/2022)   Humiliation, Afraid, Rape, and Kick questionnaire    Fear of Current or Ex-Partner: No    Emotionally Abused: No    Physically Abused: No    Sexually Abused: No    Physical Exam      Future Appointments  Date Time Provider [place]  01/28/2022 11:30 AM Lazaro Arms, RN [facility] None  04/08/2022 10:20 AM Bensimhon, Shaune Pascal, MD [place] None       Renee Ramus, [facility] Paramedic  01/27/22

## 2022-01-28 ENCOUNTER — Other Ambulatory Visit (HOSPITAL_COMMUNITY): Payer: Self-pay

## 2022-01-28 ENCOUNTER — Ambulatory Visit: Payer: Self-pay

## 2022-01-28 ENCOUNTER — Other Ambulatory Visit (HOSPITAL_COMMUNITY): Payer: Self-pay | Admitting: Emergency Medicine

## 2022-01-28 ENCOUNTER — Other Ambulatory Visit (HOSPITAL_COMMUNITY): Payer: Self-pay | Admitting: *Deleted

## 2022-01-28 MED ORDER — TORSEMIDE 20 MG PO TABS
20.0000 mg | ORAL_TABLET | ORAL | 3 refills | Status: DC
  Filled 2022-01-28 (×2): qty 30, 70d supply, fill #0
  Filled 2022-02-24 – 2022-03-26 (×2): qty 30, 70d supply, fill #1
  Filled 2022-04-28 – 2022-05-26 (×2): qty 30, 70d supply, fill #2
  Filled 2022-08-05: qty 30, 70d supply, fill #3

## 2022-01-28 MED ORDER — EMPAGLIFLOZIN 10 MG PO TABS
10.0000 mg | ORAL_TABLET | Freq: Every day | ORAL | 3 refills | Status: DC
  Filled 2022-01-28: qty 30, 30d supply, fill #0
  Filled 2022-02-26: qty 30, 30d supply, fill #1
  Filled 2022-04-01: qty 30, 30d supply, fill #2
  Filled 2022-05-05: qty 30, 30d supply, fill #3

## 2022-01-28 MED ORDER — SACUBITRIL-VALSARTAN 24-26 MG PO TABS
1.0000 | ORAL_TABLET | Freq: Two times a day (BID) | ORAL | 3 refills | Status: DC
  Filled 2022-01-28 (×3): qty 60, 30d supply, fill #0
  Filled 2022-02-12 – 2022-02-26 (×2): qty 60, 30d supply, fill #1
  Filled 2022-03-26: qty 60, 30d supply, fill #2
  Filled 2022-04-28: qty 60, 30d supply, fill #3

## 2022-01-28 NOTE — Progress Notes (Signed)
CBG 100 Weight today 152lb  Picked up Valla Leaver and Torsemide from [facility]. And delivered to Mr. Zwicker's residence.  Completed med box reconciliation.  Pt had a 6 lb weight gain and I had sent a message to HF Triage a message regarding weight and BP and was instructed to do 20mg  Torsemide daily x 1 week and then go back to [place], [place], Friday.  Pt refused to take the Torsemide and stated that he was told the weight he was looking for was >156.  I advised him that this was based on a 6lb weight gain in a weeks time.  He continued to refuse stating, "I feel fine, my legs aren't swollen and I'm not SOB." Next home visit for him is 9/13 @ 1:00. Visit complete

## 2022-01-28 NOTE — Patient Outreach (Signed)
  Care Coordination   01/28/2022 Name: Alexavier Tsutsui MRN: [id number] DOB: Mar 07, 1961   Care Coordination Outreach Attempts:  An unsuccessful telephone outreach was attempted today to offer the patient information about available care coordination services as a benefit of their health plan.   Follow Up Plan:  Additional outreach attempts will be made to offer the patient care coordination information and services.   Encounter Outcome:  No Answer  Care Coordination Interventions Activated:  No   Care Coordination Interventions:  No, not indicated    Juanell Fairly RN, BSN, [facility] Care Coordinator [facility]   Phone: [phone]

## 2022-01-29 ENCOUNTER — Telehealth (HOSPITAL_COMMUNITY): Payer: Self-pay | Admitting: *Deleted

## 2022-01-29 NOTE — Telephone Encounter (Signed)
-----   Message from Beatrix Shipper, EMT sent at 01/28/2022  5:57 PM EDT ----- Regarding: RE: weight gain and htn Pt refused to take Torsemide.  He is stuck on a weight of 156lb as a "magic number" for him to look for to take his "fluid pill."  I tried to explain to him that this dosing was recommended based on a 6lb weight gain in 1 weeks time.  He continued to refuse.  He also does not want to take Mon/Wed/Fridday either.  Just wanted to let someone know.    Thanks, [name]  ----- Message ----- From: Modesta Messing, CMA Sent: 01/28/2022  10:05 AM EDT To: Beatrix Shipper, EMT Subject: FW: weight gain and htn                         ----- Message ----- From: Jacklynn Ganong, FNP Sent: 01/28/2022   9:59 AM EDT To: Modesta Messing, CMA Subject: RE: weight gain and htn                        He has orders to take extra torsemide 20 mg PRN weight/gain swelling. OK to take torsemide 20 mg daily x 1 week, then back to MWF.  Repeat BMET in 10 days. ----- Message ----- From: Modesta Messing, CMA Sent: 01/28/2022   9:35 AM EDT To: Jacklynn Ganong, FNP Subject: FW: weight gain and htn                         ----- Message ----- From: Beatrix Shipper, EMT Sent: 01/27/2022   9:58 PM EDT To: [facility] Triage Pool Subject: weight gain and htn                            Home visit finds Mr. Zentz with 6lb weight gain 153lb.  HR 80 R16.   Last home visit on 8/30= 118/78 HR 85  R16 weight 147lb.  Pt has no complaints of chest pain or SOB.  Lung sounds clear.  No edema noted to his extremities.  He has not taken his evening meds yet.   I am concerned of possible fluid overload.   Please advise of desired med changes if any.  I will be going back out tomorrow for a follow up home visit.  Thanks    Beatrix Shipper, EMT-Paramedic [phone] 01/27/2022

## 2022-02-01 ENCOUNTER — Telehealth: Payer: Self-pay | Admitting: Family Medicine

## 2022-02-01 DIAGNOSIS — Z91128 Patient's intentional underdosing of medication regimen for other reason: Secondary | ICD-10-CM | POA: Diagnosis not present

## 2022-02-01 DIAGNOSIS — N189 Chronic kidney disease, unspecified: Secondary | ICD-10-CM | POA: Diagnosis not present

## 2022-02-01 DIAGNOSIS — E8582 Wild-type transthyretin-related (ATTR) amyloidosis: Secondary | ICD-10-CM | POA: Diagnosis not present

## 2022-02-01 DIAGNOSIS — W19XXXD Unspecified fall, subsequent encounter: Secondary | ICD-10-CM | POA: Diagnosis not present

## 2022-02-01 DIAGNOSIS — I252 Old myocardial infarction: Secondary | ICD-10-CM | POA: Diagnosis not present

## 2022-02-01 DIAGNOSIS — Z9181 History of falling: Secondary | ICD-10-CM | POA: Diagnosis not present

## 2022-02-01 DIAGNOSIS — I2510 Atherosclerotic heart disease of native coronary artery without angina pectoris: Secondary | ICD-10-CM | POA: Diagnosis not present

## 2022-02-01 DIAGNOSIS — I130 Hypertensive heart and chronic kidney disease with heart failure and stage 1 through stage 4 chronic kidney disease, or unspecified chronic kidney disease: Secondary | ICD-10-CM | POA: Diagnosis not present

## 2022-02-01 DIAGNOSIS — I5023 Acute on chronic systolic (congestive) heart failure: Secondary | ICD-10-CM | POA: Diagnosis not present

## 2022-02-01 DIAGNOSIS — Z87891 Personal history of nicotine dependence: Secondary | ICD-10-CM | POA: Diagnosis not present

## 2022-02-01 DIAGNOSIS — E1140 Type 2 diabetes mellitus with diabetic neuropathy, unspecified: Secondary | ICD-10-CM | POA: Diagnosis not present

## 2022-02-01 DIAGNOSIS — E1165 Type 2 diabetes mellitus with hyperglycemia: Secondary | ICD-10-CM | POA: Diagnosis not present

## 2022-02-01 DIAGNOSIS — Z8673 Personal history of transient ischemic attack (TIA), and cerebral infarction without residual deficits: Secondary | ICD-10-CM | POA: Diagnosis not present

## 2022-02-01 DIAGNOSIS — E1122 Type 2 diabetes mellitus with diabetic chronic kidney disease: Secondary | ICD-10-CM | POA: Diagnosis not present

## 2022-02-01 DIAGNOSIS — Z7982 Long term (current) use of aspirin: Secondary | ICD-10-CM | POA: Diagnosis not present

## 2022-02-01 NOTE — Telephone Encounter (Signed)
Correct

## 2022-02-01 NOTE — Telephone Encounter (Signed)
Patient dismissed from practice as of 01/30/22. Pt states provider told him he was allowed to come back. Pls advise

## 2022-02-02 ENCOUNTER — Other Ambulatory Visit (HOSPITAL_COMMUNITY): Payer: Self-pay

## 2022-02-02 ENCOUNTER — Telehealth (HOSPITAL_COMMUNITY): Payer: Self-pay | Admitting: Emergency Medicine

## 2022-02-02 NOTE — Telephone Encounter (Signed)
Had conversation with [facility] regarding Thomas West.  We both have been going to serve Thomas West and overlapping visits.  My goal was to perhaps alternate visits to maximize both resources for the pt.  I was advised that he had been discharged from home RN visits and will just be having physical therapy now.   This solves overlap issues.  Nothing further needed.

## 2022-02-02 NOTE — Telephone Encounter (Signed)
Called to confirm tomorrow's appointment @ 1:00.  No answer and voicemail has not been set up.

## 2022-02-03 ENCOUNTER — Other Ambulatory Visit (HOSPITAL_COMMUNITY): Payer: Self-pay | Admitting: Emergency Medicine

## 2022-02-03 NOTE — Progress Notes (Signed)
Paramedicine Encounter    Patient ID: Thomas West, male    DOB: 11/20/1960, 61 y.o.   MRN: [id number]   BP 120/70 (BP Location: Left Arm, Patient Position: Sitting)   Pulse 80   Resp 14   Wt 151 lb 9.6 oz (68.8 kg)   BMI 22.39 kg/m  Weight yesterday-not taken Last visit weight-152lb  ATF Mr. Meador A&O x 4, skin W&D w/ good color.  Pt was in a good mood.  He denies chest pain or SOB.  Lung sounds clear and equal bilat.  No edema noted He has done well taking his meds.  Pill box reconciled.  Visit complete.    Renee Ramus, [place] 02/03/2022    Patient Care Team: [name], [name], MD as PCP - General (Family Medicine) Josue Hector, MD as PCP - Cardiology (Cardiology)  Patient Active Problem List   Diagnosis Date Noted   Polyneuropathy associated with underlying disease ([place]) 01/15/2022   Atherosclerosis of aorta ([place]) 01/15/2022   History of CVA (cerebrovascular accident) without residual deficits 12/24/2021   AKI (acute kidney injury) ([place]) 12/13/2021   Diarrhea 12/13/2021   Shortness of breath 12/13/2021   Chest pain 12/13/2021   Nausea and vomiting 12/13/2021   Heart failure ([place]) 12/08/2021   Hypertension associated with diabetes ([place]) 07/12/2021   Chronic kidney disease, stage 3a ([place]) 07/12/2021   Hyperkalemia 07/12/2021   CHF exacerbation ([place]) 04/20/2021   Acute on chronic combined systolic and diastolic CHF (congestive heart failure) ([place]) 03/09/2021   Acute on chronic HFrEF (heart failure with reduced ejection fraction) ([place]) 03/08/2021   HFrEF (heart failure with reduced ejection fraction) ([place]) 02/03/2021   Diabetes mellitus ([place]) 03/27/2019   Hyperlipidemia associated with type 2 diabetes mellitus ([place]) 03/27/2019   CAD (coronary artery disease) 03/05/2019   History of MI (myocardial infarction) 03/05/2019   Type 2 diabetes mellitus with diabetic neuropathy, unspecified ([place]) 03/05/2019   HLD (hyperlipidemia) 03/05/2019   GERD  (gastroesophageal reflux disease) 03/05/2019   Hypertension, essential, benign 03/05/2019    Current Outpatient Medications:    aspirin EC 81 MG tablet, Take 1 tablet (81 mg total) by mouth daily. Swallow whole., Disp: 30 tablet, Rfl: 12   atorvastatin (LIPITOR) 80 MG tablet, Take 1 tablet (80 mg total) by mouth daily., Disp: 30 tablet, Rfl: 6   carvedilol (COREG) 6.25 MG tablet, Take 1 tablet (6.25 mg total) by mouth EVERY 12 HOURS (10AM &10PM), Disp: 60 tablet, Rfl: 6   clopidogrel (PLAVIX) 75 MG tablet, Take 1 tablet (75 mg total) by mouth daily., Disp: 30 tablet, Rfl: 3   Continuous Blood Gluc Receiver ([place]) DEVI, Use to check blood sugars, Disp: 3 each, Rfl: 3   Continuous Blood Gluc Sensor (DEXCOM G7 SENSOR) MISC, Use to check blood sugars, Disp: 3 each, Rfl: 3   cyanocobalamin 1000 MCG tablet, Take 1 tablet (1,000 mcg total) by mouth daily., Disp: 30 tablet, Rfl: 0   empagliflozin (JARDIANCE) 10 MG TABS tablet, Take 1 tablet (10 mg total) by mouth daily., Disp: 30 tablet, Rfl: 3   hydrALAZINE (APRESOLINE) 50 MG tablet, Take 1 tablet (50 mg total) by mouth 3 (three) times daily., Disp: 90 tablet, Rfl: 6   hydrocortisone cream 1 %, Apply to affected area 2 times daily (Patient taking differently: Apply 1 Application topically See admin instructions. 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 (Patient not taking: Reported on 01/20/2022), Disp: , Rfl:    isosorbide mononitrate (IMDUR) 30 MG 24 hr tablet, Take 1 tablet (30 mg total) by mouth daily. (Patient not taking: Reported on  01/28/2022), Disp: 30 tablet, Rfl: 6   senna-docusate (SENOKOT-S) 8.6-50 MG tablet, Take 1 tablet by mouth at bedtime. (Patient not taking: Reported on 01/27/2022), Disp: 20 tablet, Rfl: 0   torsemide (DEMADEX) 20 MG tablet, Take 1 tablet (20 mg total) by mouth every Monday, Wednesday, and Friday., Disp: 30 tablet, Rfl: 3 Allergies  Allergen Reactions   Bee Venom Anaphylaxis, Swelling and Other (See Comments)    Swells all over      Social History   Socioeconomic History   Marital status: Single    Spouse name: Not on file   Number of children: Not on file   Years of education: Not on file   Highest education level: Not on file  Occupational History   Not on file  Tobacco Use   Smoking status: Former    Types: Cigarettes    Quit date: 10/06/2017    Years since quitting: 4.3   Smokeless tobacco: Never  Substance and Sexual Activity   Alcohol use: Not Currently   Drug use: Yes    Types: Marijuana   Sexual activity: Not on file  Other Topics Concern   Not on file  Social History Narrative   Not on file   Social Determinants of Health   Financial Resource Strain: Low Risk  (01/18/2022)   Overall Financial Resource Strain (CARDIA)    Difficulty of Paying Living Expenses: Not hard at all  Food Insecurity: No Food Insecurity (01/18/2022)   Hunger Vital Sign    Worried About Running Out of Food in the Last Year: Never true    Ran Out of Food in the Last Year: Never true  Recent Concern: Food Insecurity - Food Insecurity Present (12/07/2021)   Hunger Vital Sign    Worried About Running Out of Food in the Last Year: Sometimes true    Ran Out of Food in the Last Year: Never true  Transportation Needs: No Transportation Needs (01/18/2022)   PRAPARE - Administrator, Civil Service (Medical): No    Lack of Transportation (Non-Medical): No  Recent Concern: Transportation Needs - Unmet Transportation Needs (12/31/2021)   PRAPARE - Transportation    Lack of Transportation  (Medical): Yes    Lack of Transportation (Non-Medical): Yes  Physical Activity: Inactive (01/18/2022)   Exercise Vital Sign    Days of Exercise per Week: 0 days    Minutes of Exercise per Session: 0 min  Stress: Stress Concern Present (01/18/2022)   [employer] of Occupational Health - Occupational Stress Questionnaire    Feeling of Stress : To some extent  Social Connections: Socially Isolated (01/18/2022)   Social Connection and Isolation Panel [NHANES]    Frequency of Communication with Friends and Family: More than three times a week    Frequency of Social Gatherings with Friends and Family: More than three times a week    Attends Religious Services: Never    Database administrator or  Organizations: No    Attends Banker Meetings: Never    Marital Status: Never married  Intimate Partner Violence: Not At Risk (01/18/2022)   Humiliation, Afraid, Rape, and Kick questionnaire    Fear of Current or Ex-Partner: No    Emotionally Abused: No    Physically Abused: No    Sexually Abused: No    Physical Exam      Future Appointments  Date Time Provider Department Center  04/08/2022 10:20 AM Bensimhon, Bevelyn Buckles, MD [facility] None       Beatrix Shipper, EMT-Paramedic [phone] [facility] Paramedic  02/03/22

## 2022-02-03 NOTE — Telephone Encounter (Signed)
Attempted to schedule patient, phone number was out of service

## 2022-02-05 DIAGNOSIS — I252 Old myocardial infarction: Secondary | ICD-10-CM | POA: Diagnosis not present

## 2022-02-05 DIAGNOSIS — N189 Chronic kidney disease, unspecified: Secondary | ICD-10-CM | POA: Diagnosis not present

## 2022-02-05 DIAGNOSIS — I130 Hypertensive heart and chronic kidney disease with heart failure and stage 1 through stage 4 chronic kidney disease, or unspecified chronic kidney disease: Secondary | ICD-10-CM | POA: Diagnosis not present

## 2022-02-05 DIAGNOSIS — E8582 Wild-type transthyretin-related (ATTR) amyloidosis: Secondary | ICD-10-CM | POA: Diagnosis not present

## 2022-02-05 DIAGNOSIS — Z91128 Patient's intentional underdosing of medication regimen for other reason: Secondary | ICD-10-CM | POA: Diagnosis not present

## 2022-02-05 DIAGNOSIS — Z8673 Personal history of transient ischemic attack (TIA), and cerebral infarction without residual deficits: Secondary | ICD-10-CM | POA: Diagnosis not present

## 2022-02-05 DIAGNOSIS — Z9181 History of falling: Secondary | ICD-10-CM | POA: Diagnosis not present

## 2022-02-05 DIAGNOSIS — I2510 Atherosclerotic heart disease of native coronary artery without angina pectoris: Secondary | ICD-10-CM | POA: Diagnosis not present

## 2022-02-05 DIAGNOSIS — W19XXXD Unspecified fall, subsequent encounter: Secondary | ICD-10-CM | POA: Diagnosis not present

## 2022-02-05 DIAGNOSIS — E1165 Type 2 diabetes mellitus with hyperglycemia: Secondary | ICD-10-CM | POA: Diagnosis not present

## 2022-02-05 DIAGNOSIS — I5023 Acute on chronic systolic (congestive) heart failure: Secondary | ICD-10-CM | POA: Diagnosis not present

## 2022-02-05 DIAGNOSIS — Z7982 Long term (current) use of aspirin: Secondary | ICD-10-CM | POA: Diagnosis not present

## 2022-02-05 DIAGNOSIS — E1140 Type 2 diabetes mellitus with diabetic neuropathy, unspecified: Secondary | ICD-10-CM | POA: Diagnosis not present

## 2022-02-05 DIAGNOSIS — E1122 Type 2 diabetes mellitus with diabetic chronic kidney disease: Secondary | ICD-10-CM | POA: Diagnosis not present

## 2022-02-05 DIAGNOSIS — Z87891 Personal history of nicotine dependence: Secondary | ICD-10-CM | POA: Diagnosis not present

## 2022-02-09 ENCOUNTER — Other Ambulatory Visit (HOSPITAL_COMMUNITY): Payer: Self-pay | Admitting: Emergency Medicine

## 2022-02-09 NOTE — Progress Notes (Signed)
Paramedicine Encounter    Patient ID: Thomas West, male    DOB: 1960-05-25, 61 y.o.   MRN: [id number]   BP (!) 142/80 (BP Location: Left Arm, Patient Position: Sitting)   Pulse 90   Resp 16   Wt 151 lb 9.6 oz (68.8 kg)   SpO2 96%   BMI 22.39 kg/m  Weight yesterday-not taken Last visit weight-151 lb  ATF Mr. Kope A&O x 4, skin W&D w/ good color.  He was very pleasant today.  He denies chest pain or SOB.  Lung sounds clear and equal bilat.  No edema noted.  Pt has not done well with his med compliance this visit. He has not taken any of his noon Hydralazine nor 4 days of his evening meds.   I reviewed this with him and encouraged him to be more diligent in taking his meds and he agreed to work on same.  Pill box reconciled.  Home visit complete.    Renee Ramus, [place] 02/09/2022   Patient Care Team: [name], [name], MD as PCP - General (Family Medicine) Josue Hector, MD as PCP - Cardiology (Cardiology)  Patient Active Problem List   Diagnosis Date Noted   Polyneuropathy associated with underlying disease ([place]) 01/15/2022   Atherosclerosis of aorta ([place]) 01/15/2022   History of CVA (cerebrovascular accident) without residual deficits 12/24/2021   AKI (acute kidney injury) ([place]) 12/13/2021   Diarrhea 12/13/2021   Shortness of breath 12/13/2021   Chest pain 12/13/2021   Nausea and vomiting 12/13/2021   Heart failure ([place]) 12/08/2021   Hypertension associated with diabetes ([place]) 07/12/2021   Chronic kidney disease, stage 3a ([place]) 07/12/2021   Hyperkalemia 07/12/2021   CHF exacerbation ([place]) 04/20/2021   Acute on chronic combined systolic and diastolic CHF (congestive heart failure) ([place]) 03/09/2021   Acute on chronic HFrEF (heart failure with reduced ejection fraction) ([place]) 03/08/2021   HFrEF (heart failure with reduced ejection fraction) ([place]) 02/03/2021   Diabetes mellitus ([place]) 03/27/2019   Hyperlipidemia associated with type 2 diabetes mellitus  ([place]) 03/27/2019   CAD (coronary artery disease) 03/05/2019   History of MI (myocardial infarction) 03/05/2019   Type 2 diabetes mellitus with diabetic neuropathy, unspecified ([place]) 03/05/2019   HLD (hyperlipidemia) 03/05/2019   GERD (gastroesophageal reflux disease) 03/05/2019   Hypertension, essential, benign 03/05/2019    Current Outpatient Medications:    aspirin EC 81 MG tablet, Take 1 tablet (81 mg total) by mouth daily. Swallow whole., Disp: 30 tablet, Rfl: 12   atorvastatin (LIPITOR) 80 MG tablet, Take 1 tablet (80 mg total) by mouth daily., Disp: 30 tablet, Rfl: 6   carvedilol (COREG) 6.25 MG tablet, Take 1 tablet (6.25 mg total) by mouth EVERY 12 HOURS (10AM &10PM), Disp: 60 tablet, Rfl: 6   clopidogrel (PLAVIX) 75 MG tablet, Take 1 tablet (75 mg total) by mouth daily., Disp: 30 tablet, Rfl: 3   Continuous Blood Gluc Receiver ([place]) DEVI, Use to check blood sugars, Disp: 3 each, Rfl: 3   Continuous Blood Gluc Sensor (DEXCOM G7 SENSOR) MISC, Use to check blood sugars, Disp: 3 each, Rfl: 3   cyanocobalamin 1000 MCG tablet, Take 1 tablet (1,000 mcg total) by mouth daily., Disp: 30 tablet, Rfl: 0   empagliflozin (JARDIANCE) 10 MG TABS tablet, Take 1 tablet (10 mg total) by mouth daily., Disp: 30 tablet, Rfl: 3   hydrALAZINE (APRESOLINE) 50 MG tablet, Take 1 tablet (50 mg total) by mouth 3 (three) times daily., Disp: 90 tablet, Rfl:  6   sacubitril-valsartan (ENTRESTO) 24-26 MG, Take 1 tablet by mouth 2 (two) times daily., Disp: 60 tablet, Rfl: 3   spironolactone (ALDACTONE) 25 MG tablet, Take 1 tablet (25 mg total) by mouth daily., Disp: 30 tablet, Rfl: 11   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 (Patient not taking: Reported on  02/09/2022), Disp: , Rfl:    hydrocortisone cream 1 %, Apply to affected area 2 times daily (Patient not taking: Reported on 02/09/2022), Disp: 28 g, Rfl: 0   isosorbide mononitrate (IMDUR) 30 MG 24 hr tablet, Take 1 tablet (30 mg total) by mouth daily. (Patient not taking: Reported on 01/28/2022), Disp: 30 tablet, Rfl: 6   senna-docusate (SENOKOT-S) 8.6-50 MG tablet, Take 1 tablet by mouth at bedtime. (Patient not taking: Reported on 01/27/2022), Disp: 20 tablet, Rfl: 0 Allergies  Allergen Reactions   Bee Venom Anaphylaxis, Swelling and Other (See Comments)    Swells all over      Social History   Socioeconomic History   Marital status: Single    Spouse name: Not on file   Number of children: Not on file   Years of education: Not on file   Highest education level: Not on file  Occupational History   Not on file  Tobacco Use   Smoking status: Former    Types: Cigarettes    Quit date: 10/06/2017    Years since quitting: 4.3   Smokeless tobacco: Never  Substance and Sexual Activity   Alcohol use: Not Currently   Drug use: Yes    Types: Marijuana   Sexual activity: Not on file  Other Topics Concern   Not on file  Social History Narrative   Not on file   Social Determinants of Health   Financial Resource Strain: Low Risk  (01/18/2022)   Overall Financial Resource Strain (CARDIA)    Difficulty of Paying Living Expenses: Not hard at all  Food Insecurity: No Food Insecurity (01/18/2022)   Hunger Vital Sign    Worried About Running Out of Food in the Last Year: Never true    Ran Out of Food in the Last Year: Never true  Recent Concern: Food Insecurity - Food Insecurity Present (12/07/2021)   Hunger Vital Sign    Worried About Running Out of Food in the Last Year: Sometimes true    Ran Out of Food in the Last Year: Never true  Transportation Needs: No Transportation Needs (01/18/2022)   PRAPARE - Hydrologist (Medical): No    Lack of Transportation  (Non-Medical): No  Recent Concern: Transportation Needs - Unmet Transportation Needs (12/31/2021)   PRAPARE - Transportation    Lack of Transportation (Medical): Yes    Lack of Transportation (Non-Medical): Yes  Physical Activity: Inactive (01/18/2022)   Exercise Vital Sign    Days of Exercise per Week: 0 days    Minutes of Exercise per Session: 0 min  Stress: Stress Concern Present (01/18/2022)   [place]    Feeling of Stress : To some extent  Social Connections: Socially Isolated (01/18/2022)   Social Connection and Isolation Panel [NHANES]    Frequency of Communication with Friends and Family: More than  three times a week    Frequency of Social Gatherings with Friends and Family: More than three times a week    Attends Religious Services: Never    Marine scientist or Organizations: No    Attends Archivist Meetings: Never    Marital Status: Never married  Intimate Partner Violence: Not At Risk (01/18/2022)   Humiliation, Afraid, Rape, and Kick questionnaire    Fear of Current or Ex-Partner: No    Emotionally Abused: No    Physically Abused: No    Sexually Abused: No    Physical Exam      Future Appointments  Date Time Provider [place]  04/08/2022 10:20 AM Bensimhon, Shaune Pascal, MD [place] None       Renee Ramus, [place] [facility] Paramedic  02/09/22

## 2022-02-12 ENCOUNTER — Other Ambulatory Visit (HOSPITAL_COMMUNITY): Payer: Self-pay

## 2022-02-12 ENCOUNTER — Other Ambulatory Visit (HOSPITAL_COMMUNITY): Payer: Self-pay | Admitting: Emergency Medicine

## 2022-02-12 NOTE — Progress Notes (Signed)
Picked up Thomas West's Isosorbide at [facility] and scheduled his next refills to be picked up on 02/26/22.  I delivered this prescription to Thomas West and advised him to place it in his med bag pending our next home visit.

## 2022-02-16 ENCOUNTER — Other Ambulatory Visit (HOSPITAL_COMMUNITY): Payer: Self-pay | Admitting: Emergency Medicine

## 2022-02-16 NOTE — Progress Notes (Signed)
Arrived at Thomas West's home for a scheduled home visit.  He stated he would prefer if I can come back tomorrow.    Renee Ramus, [place] 02/16/2022

## 2022-02-17 ENCOUNTER — Other Ambulatory Visit (HOSPITAL_COMMUNITY): Payer: Self-pay | Admitting: Emergency Medicine

## 2022-02-17 NOTE — Progress Notes (Signed)
Paramedicine Encounter    Patient ID: Thomas West, male    DOB: 1960/11/25, 61 y.o.   MRN: [id number]   BP 120/80 (BP Location: Left Arm, Patient Position: Sitting, Cuff Size: Normal)   Pulse 77   Resp 16   Wt 152 lb 3.2 oz (69 kg)   SpO2 97%   BMI 22.48 kg/m  Weight yesterday-not taken Last visit weight-151.9lb [id number]  ATF Mr. Orozco A&O x 4, skin W&D w/ good color.  Pt has not complaints of chest pain or SOB.  Lung sounds clear and equal bilat w/ no edema noted to his lower extremities.  Pt has been compliant with all medicines.  Isosorbide added back to his regimen as he had lost the prescription.  Med box reconciled w/o incident. Home visit complete.   Patient Care Team: [name], [name], MD as PCP - General (Family Medicine) Josue Hector, MD as PCP - Cardiology (Cardiology)  Patient Active Problem List   Diagnosis Date Noted   Polyneuropathy associated with underlying disease ([place]) 01/15/2022   Atherosclerosis of aorta ([place]) 01/15/2022   History of CVA (cerebrovascular accident) without residual deficits 12/24/2021   AKI (acute kidney injury) ([place]) 12/13/2021   Diarrhea 12/13/2021   Shortness of breath 12/13/2021   Chest pain 12/13/2021   Nausea and vomiting 12/13/2021   Heart failure ([place]) 12/08/2021   Hypertension associated with diabetes ([place]) 07/12/2021   Chronic kidney disease, stage 3a ([place]) 07/12/2021   Hyperkalemia 07/12/2021   CHF exacerbation ([place]) 04/20/2021   Acute on chronic combined systolic and diastolic CHF (congestive heart failure) ([place]) 03/09/2021   Acute on chronic HFrEF (heart failure with reduced ejection fraction) ([place]) 03/08/2021   HFrEF (heart failure with reduced ejection fraction) ([place]) 02/03/2021   Diabetes mellitus ([place]) 03/27/2019   Hyperlipidemia associated with type 2 diabetes mellitus ([place]) 03/27/2019   CAD (coronary artery disease) 03/05/2019   History of MI (myocardial infarction) 03/05/2019   Type 2 diabetes mellitus with  diabetic neuropathy, unspecified ([place]) 03/05/2019   HLD (hyperlipidemia) 03/05/2019   GERD (gastroesophageal reflux disease) 03/05/2019   Hypertension, essential, benign 03/05/2019    Current Outpatient Medications:    aspirin EC 81 MG tablet, Take 1 tablet (81 mg total) by mouth daily. Swallow whole., Disp: 30 tablet, Rfl: 12   atorvastatin (LIPITOR) 80 MG tablet, Take 1 tablet (80 mg total) by mouth daily., Disp: 30 tablet, Rfl: 6   carvedilol (COREG) 6.25 MG tablet, Take 1 tablet (6.25 mg total) by mouth EVERY 12 HOURS (10AM &10PM), Disp: 60 tablet, Rfl: 6   clopidogrel (PLAVIX) 75 MG tablet, Take 1 tablet (75 mg total) by mouth daily., Disp: 30 tablet, Rfl: 3   Continuous Blood Gluc Receiver ([place]) DEVI, Use to check blood sugars, Disp: 3 each, Rfl: 3   Continuous Blood Gluc Sensor (DEXCOM G7 SENSOR) MISC, Use to check blood sugars, Disp: 3 each, Rfl: 3   empagliflozin (JARDIANCE) 10 MG TABS tablet, Take 1 tablet (10 mg total) by mouth daily., Disp: 30 tablet, Rfl: 3   hydrALAZINE (APRESOLINE) 50 MG tablet, Take 1 tablet (50 mg total) by mouth 3 (three) times daily., Disp: 90 tablet, Rfl: 6   isosorbide mononitrate (IMDUR) 30 MG 24 hr tablet, Take 1 tablet (30 mg total) by mouth daily., Disp: 30 tablet, Rfl: 6   sacubitril-valsartan (ENTRESTO) 24-26 MG, Take 1 tablet by mouth 2 (two) times daily., Disp: 60 tablet, Rfl: 3   spironolactone (ALDACTONE) 25 MG tablet, Take 1 tablet (25  mg total) by mouth daily., Disp: 30 tablet, Rfl: 11   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 (Patient not taking: Reported on 02/17/2022), Disp: 30 tablet, Rfl: 0   gabapentin (NEURONTIN) 400 MG capsule, Take 400 mg by mouth daily. (Patient not  taking: Reported on 02/09/2022), Disp: , Rfl:    hydrocortisone cream 1 %, Apply to affected area 2 times daily (Patient not taking: Reported on 02/09/2022), Disp: 28 g, Rfl: 0   senna-docusate (SENOKOT-S) 8.6-50 MG tablet, Take 1 tablet by mouth at bedtime. (Patient not taking: Reported on 01/27/2022), Disp: 20 tablet, Rfl: 0 Allergies  Allergen Reactions   Bee Venom Anaphylaxis, Swelling and Other (See Comments)    Swells all over      Social History   Socioeconomic History   Marital status: Single    Spouse name: Not on file   Number of children: Not on file   Years of education: Not on file   Highest education level: Not on file  Occupational History   Not on file  Tobacco Use   Smoking status: Former    Types: Cigarettes    Quit date: 10/06/2017    Years since quitting: 4.3   Smokeless tobacco: Never  Substance and Sexual Activity   Alcohol use: Not Currently   Drug use: Yes    Types: Marijuana   Sexual activity: Not on file  Other Topics Concern   Not on file  Social History Narrative   Not on file   Social Determinants of Health   Financial Resource Strain: Low Risk  (01/18/2022)   Overall Financial Resource Strain (CARDIA)    Difficulty of Paying Living Expenses: Not hard at all  Food Insecurity: No Food Insecurity (01/18/2022)   Hunger Vital Sign    Worried About Running Out of Food in the Last Year: Never true    Ran Out of Food in the Last Year: Never true  Recent Concern: Food Insecurity - Food Insecurity Present (12/07/2021)   Hunger Vital Sign    Worried About Running Out of Food in the Last Year: Sometimes true    Ran Out of Food in the Last Year: Never true  Transportation Needs: No Transportation Needs (01/18/2022)   PRAPARE - Hydrologist (Medical): No    Lack of Transportation (Non-Medical): No  Recent Concern: Transportation Needs - Unmet Transportation Needs (12/31/2021)   PRAPARE - Transportation    Lack of Transportation  (Medical): Yes    Lack of Transportation (Non-Medical): Yes  Physical Activity: Inactive (01/18/2022)   Exercise Vital Sign    Days of Exercise per Week: 0 days    Minutes of Exercise per Session: 0 min  Stress: Stress Concern Present (01/18/2022)   [place]    Feeling of Stress : To some extent  Social Connections: Socially Isolated (01/18/2022)   Social Connection and Isolation Panel [NHANES]    Frequency of Communication with Friends and Family: More than three times a week    Frequency of Social Gatherings with Friends and Family: More than three times a week    Attends Religious Services: Never    Marine scientist or  Organizations: No    Attends Archivist Meetings: Never    Marital Status: Never married  Intimate Partner Violence: Not At Risk (01/18/2022)   Humiliation, Afraid, Rape, and Kick questionnaire    Fear of Current or Ex-Partner: No    Emotionally Abused: No    Physically Abused: No    Sexually Abused: No    Physical Exam      Future Appointments  Date Time Provider [place]  04/08/2022 10:20 AM Bensimhon, Shaune Pascal, MD [place], [facility] Paramedic  02/17/22

## 2022-02-22 ENCOUNTER — Telehealth: Payer: Self-pay | Admitting: Family Medicine

## 2022-02-22 ENCOUNTER — Other Ambulatory Visit (HOSPITAL_COMMUNITY): Payer: Self-pay

## 2022-02-22 MED ORDER — CYANOCOBALAMIN 1000 MCG PO TABS: 1000.0000 ug | ORAL_TABLET | Freq: Every day | ORAL | 3 refills | Status: DC

## 2022-02-22 NOTE — Telephone Encounter (Signed)
Requesting refill vitamin B-12 1000 MCG tablet

## 2022-02-24 ENCOUNTER — Other Ambulatory Visit (HOSPITAL_COMMUNITY): Payer: Self-pay

## 2022-02-24 ENCOUNTER — Other Ambulatory Visit (HOSPITAL_COMMUNITY): Payer: Self-pay | Admitting: Emergency Medicine

## 2022-02-24 NOTE — Progress Notes (Signed)
Paramedicine Encounter    Patient ID: Thomas West, male    DOB: June 16, 1960, 61 y.o.   MRN: [id number]   BP (!) 160/100 (BP Location: Left Arm, Patient Position: Sitting, Cuff Size: Normal)   Pulse 82   Resp 16   Wt 155 lb 6.4 oz (70.5 kg)   SpO2 99%   BMI 22.95 kg/m  Weight yesterday-not taken Last visit weight-152.3lb  ATF [name] A&O x 4, skin W & D w/ good color.  Pt. Reports to be feeling well today.  He is taking his morning and evening meds but not taking the noon dose of Hydralazine.  I encouraged him to be more diligent and maybe set an alarm to help him remember his noontime dose.  Pt denies chest pain or SOB.  Lung sound clear and equal bilat.  No edema noted to his extremities.  His pressure was elevated today but he had just taken his morning meds just prior to my arrival.   Med box reconciled.   Pt. Stated he was having issues with his DexCom 7 sensors. He stated they only lasted 10 days.  I called the pharmacy and they advised the sensors last for a month.  I then called [facility] who advised the sensors should only last 10 days so I had to call [facility] back and request a refill.  They advised sensors would be delivered tomorrow.  Working on getting all his meds moved to [facility] - they are closer, more dependable and they also deliver.  Home visit complete.    Renee Ramus, [place] 02/24/2022  Patient Care Team: [name], [name], MD as PCP - General (Family Medicine) Josue Hector, MD as PCP - Cardiology (Cardiology)  Patient Active Problem List   Diagnosis Date Noted   Polyneuropathy associated with underlying disease ([place]) 01/15/2022   Atherosclerosis of aorta ([place]) 01/15/2022   History of CVA (cerebrovascular accident) without residual deficits 12/24/2021   AKI (acute kidney injury) ([place]) 12/13/2021   Diarrhea 12/13/2021   Shortness of breath 12/13/2021   Chest pain 12/13/2021   Nausea and  vomiting 12/13/2021   Heart failure ([place]) 12/08/2021   Hypertension associated with diabetes ([place]) 07/12/2021   Chronic kidney disease, stage 3a ([place]) 07/12/2021   Hyperkalemia 07/12/2021   CHF exacerbation ([place]) 04/20/2021   Acute on chronic combined systolic and diastolic CHF (congestive heart failure) ([place]) 03/09/2021   Acute on chronic HFrEF (heart failure with reduced ejection fraction) ([place]) 03/08/2021   HFrEF (heart failure with reduced ejection fraction) ([place]) 02/03/2021   Diabetes mellitus ([place]) 03/27/2019   Hyperlipidemia associated with type 2 diabetes mellitus ([place]) 03/27/2019   CAD (coronary artery disease) 03/05/2019   History of MI (myocardial infarction) 03/05/2019   Type 2 diabetes mellitus with diabetic neuropathy, unspecified ([place]) 03/05/2019   HLD (hyperlipidemia) 03/05/2019   GERD (gastroesophageal reflux disease) 03/05/2019   Hypertension, essential, benign 03/05/2019    Current Outpatient Medications:    aspirin EC 81 MG tablet, Take 1 tablet (81 mg total) by mouth daily. Swallow whole., Disp: 30 tablet, Rfl: 12   atorvastatin (LIPITOR) 80 MG tablet, Take 1 tablet (80 mg total) by mouth daily., Disp: 30 tablet, Rfl: 6   carvedilol (COREG) 6.25 MG tablet, Take 1 tablet (6.25 mg total) by mouth EVERY 12 HOURS (10AM &10PM), Disp: 60 tablet, Rfl: 6   clopidogrel (PLAVIX) 75 MG tablet, Take 1 tablet (75 mg total) by mouth daily., Disp: 30 tablet, Rfl:  3   Continuous Blood Gluc Receiver ([place]) DEVI, Use to check blood sugars, Disp: 3 each, Rfl: 3   Continuous Blood Gluc Sensor (DEXCOM G7 SENSOR) MISC, Use to check blood sugars, Disp: 3 each, Rfl: 3   cyanocobalamin 1000 MCG tablet, Take 1 tablet (1,000 mcg total) by mouth daily., Disp: 30 tablet, Rfl: 3   empagliflozin (JARDIANCE) 10 MG TABS tablet, Take 1 tablet (10 mg total) by mouth daily., Disp: 30 tablet, Rfl: 3   hydrALAZINE (APRESOLINE) 50 MG tablet, Take 1 tablet (50 mg total) by mouth 3 (three)  times daily., Disp: 90 tablet, Rfl: 6   isosorbide mononitrate (IMDUR) 30 MG 24 hr tablet, Take 1 tablet (30 mg total) by mouth daily., Disp: 30 tablet, Rfl: 6   sacubitril-valsartan (ENTRESTO) 24-26 MG, Take 1 tablet by mouth 2 (two) times daily., Disp: 60 tablet, Rfl: 3   spironolactone (ALDACTONE) 25 MG tablet, Take 1 tablet (25 mg total) by mouth daily., Disp: 30 tablet, Rfl: 11   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 (Patient not taking: Reported on 02/09/2022), Disp: , Rfl:    hydrocortisone cream 1 %, Apply to affected area 2 times daily (Patient not taking: Reported on 02/09/2022), Disp: 28 g, Rfl: 0   senna-docusate (SENOKOT-S) 8.6-50 MG tablet, Take 1 tablet by mouth at bedtime. (Patient not taking: Reported on 01/27/2022), Disp: 20 tablet, Rfl: 0   torsemide (DEMADEX) 20 MG tablet, Take 1 tablet (20 mg total) by mouth every Monday, Wednesday, and Friday., Disp: 30 tablet, Rfl: 3 Allergies  Allergen Reactions   Bee Venom Anaphylaxis, Swelling and Other (See Comments)    Swells all over      Social History   Socioeconomic History   Marital status: Single    Spouse name: Not on file   Number of children: Not on file   Years of education: Not on file   Highest education level: Not on file  Occupational History   Not on file  Tobacco Use   Smoking status: Former    Types: Cigarettes    Quit date: 10/06/2017    Years since quitting: 4.3   Smokeless tobacco: Never  Substance and Sexual Activity   Alcohol use: Not Currently   Drug use: Yes    Types: Marijuana   Sexual activity: Not on file  Other Topics Concern   Not on file  Social History Narrative   Not on file   Social Determinants of Health   Financial Resource Strain: Low Risk  (01/18/2022)   Overall Financial Resource Strain (CARDIA)    Difficulty of Paying Living Expenses: Not hard at all  Food  Insecurity: No Food Insecurity (01/18/2022)   Hunger Vital Sign    Worried About Running Out of Food in the Last Year: Never true    Ran Out of Food in the Last Year: Never true  Recent Concern: Food Insecurity - Food Insecurity Present (12/07/2021)   Hunger Vital Sign    Worried About Running Out of Food in the Last Year: Sometimes true    Ran Out of Food in the Last Year: Never true  Transportation Needs: No Transportation Needs (01/18/2022)   PRAPARE - Hydrologist (Medical): No    Lack of Transportation (Non-Medical): No  Recent Concern: Transportation Needs - Unmet Transportation Needs (12/31/2021)   PRAPARE -  Hydrologist (Medical): Yes    Lack of Transportation (Non-Medical): Yes  Physical Activity: Inactive (01/18/2022)   Exercise Vital Sign    Days of Exercise per Week: 0 days    Minutes of Exercise per Session: 0 min  Stress: Stress Concern Present (01/18/2022)   [place]    Feeling of Stress : To some extent  Social Connections: Socially Isolated (01/18/2022)   Social Connection and Isolation Panel [NHANES]    Frequency of Communication with Friends and Family: More than three times a week    Frequency of Social Gatherings with Friends and Family: More than three times a week    Attends Religious Services: Never    Marine scientist or Organizations: No    Attends Archivist Meetings: Never    Marital Status: Never married  Intimate Partner Violence: Not At Risk (01/18/2022)   Humiliation, Afraid, Rape, and Kick questionnaire    Fear of Current or Ex-Partner: No    Emotionally Abused: No    Physically Abused: No    Sexually Abused: No    Physical Exam      Future Appointments  Date Time Provider [place]  04/08/2022 10:20 AM Bensimhon, Shaune Pascal, MD [place] None       Renee Ramus,  [facility] [facility] Paramedic  02/24/22

## 2022-02-26 ENCOUNTER — Other Ambulatory Visit (HOSPITAL_COMMUNITY): Payer: Self-pay

## 2022-03-03 ENCOUNTER — Other Ambulatory Visit (HOSPITAL_COMMUNITY): Payer: Self-pay | Admitting: Emergency Medicine

## 2022-03-03 ENCOUNTER — Other Ambulatory Visit (HOSPITAL_COMMUNITY): Payer: Self-pay

## 2022-03-03 NOTE — Progress Notes (Signed)
Paramedicine Encounter    Patient ID: Thomas West, male    DOB: 12-01-1960, 61 y.o.   MRN: [id number]   BP (!) 138/100 (BP Location: Left Arm, Patient Position: Sitting, Cuff Size: Normal)   Pulse 74   Resp 16   Wt 152 lb 6.4 oz (69.1 kg)   SpO2 99%   BMI 22.51 kg/m  Weight yesterday-not taken Last visit weight-155lb ATF Mr. Badal A&O x 4, skin W&D w/ good color.  Pt has no complaints of chest pain or SOB.  Lung sounds clear and equal bilat.  No edema noted.  He has been compliant with all his medicines except noon time dose of Hydralazine.  Med box reconciled x 1 week.  Pt. Left during my med reconciliation and his son stayed while I completed visit.  Home visit complete.    Renee Ramus, [place] 03/03/2022   Patient Care Team: [name], [name], MD as PCP - General (Family Medicine) Josue Hector, MD as PCP - Cardiology (Cardiology)  Patient Active Problem List   Diagnosis Date Noted   Polyneuropathy associated with underlying disease ([place]) 01/15/2022   Atherosclerosis of aorta ([place]) 01/15/2022   History of CVA (cerebrovascular accident) without residual deficits 12/24/2021   AKI (acute kidney injury) ([place]) 12/13/2021   Diarrhea 12/13/2021   Shortness of breath 12/13/2021   Chest pain 12/13/2021   Nausea and vomiting 12/13/2021   Heart failure ([place]) 12/08/2021   Hypertension associated with diabetes ([place]) 07/12/2021   Chronic kidney disease, stage 3a ([place]) 07/12/2021   Hyperkalemia 07/12/2021   CHF exacerbation ([place]) 04/20/2021   Acute on chronic combined systolic and diastolic CHF (congestive heart failure) ([place]) 03/09/2021   Acute on chronic HFrEF (heart failure with reduced ejection fraction) ([place]) 03/08/2021   HFrEF (heart failure with reduced ejection fraction) ([place]) 02/03/2021   Diabetes mellitus ([place]) 03/27/2019   Hyperlipidemia associated with type 2 diabetes mellitus ([place]) 03/27/2019   CAD (coronary artery disease) 03/05/2019   History of  MI (myocardial infarction) 03/05/2019   Type 2 diabetes mellitus with diabetic neuropathy, unspecified ([place]) 03/05/2019   HLD (hyperlipidemia) 03/05/2019   GERD (gastroesophageal reflux disease) 03/05/2019   Hypertension, essential, benign 03/05/2019    Current Outpatient Medications:    aspirin EC 81 MG tablet, Take 1 tablet (81 mg total) by mouth daily. Swallow whole., Disp: 30 tablet, Rfl: 12   atorvastatin (LIPITOR) 80 MG tablet, Take 1 tablet (80 mg total) by mouth daily., Disp: 30 tablet, Rfl: 6   carvedilol (COREG) 6.25 MG tablet, Take 1 tablet (6.25 mg total) by mouth EVERY 12 HOURS (10AM &10PM), Disp: 60 tablet, Rfl: 6   clopidogrel (PLAVIX) 75 MG tablet, Take 1 tablet (75 mg total) by mouth daily., Disp: 30 tablet, Rfl: 3   Continuous Blood Gluc Receiver ([place]) DEVI, Use to check blood sugars, Disp: 3 each, Rfl: 3   Continuous Blood Gluc Sensor (DEXCOM G7 SENSOR) MISC, Use to check blood sugars, Disp: 3 each, Rfl: 3   cyanocobalamin 1000 MCG tablet, Take 1 tablet (1,000 mcg total) by mouth daily., Disp: 30 tablet, Rfl: 3   empagliflozin (JARDIANCE) 10 MG TABS tablet, Take 1 tablet (10 mg total) by mouth daily., Disp: 30 tablet, Rfl: 3   hydrALAZINE (APRESOLINE) 50 MG tablet, Take 1 tablet (50 mg total) by mouth 3 (three) times daily., Disp: 90 tablet, Rfl: 6   isosorbide mononitrate (IMDUR) 30 MG 24 hr tablet, Take 1 tablet (30 mg total) by mouth daily., Disp: 30  tablet, Rfl: 6   sacubitril-valsartan (ENTRESTO) 24-26 MG, Take 1 tablet by mouth 2 (two) times daily., Disp: 60 tablet, Rfl: 3   spironolactone (ALDACTONE) 25 MG tablet, Take 1 tablet (25 mg total) by mouth daily., Disp: 30 tablet, Rfl: 11   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 (Patient not taking: Reported on 02/09/2022), Disp: , Rfl:    hydrocortisone cream 1 %, Apply to affected area 2 times daily (Patient not taking: Reported on 02/09/2022), Disp: 28 g, Rfl: 0   senna-docusate (SENOKOT-S) 8.6-50 MG tablet, Take 1 tablet by mouth at bedtime. (Patient not taking: Reported on 01/27/2022), Disp: 20 tablet, Rfl: 0 Allergies  Allergen Reactions   Bee Venom Anaphylaxis, Swelling and Other (See Comments)    Swells all over      Social History   Socioeconomic History   Marital status: Single    Spouse name: Not on file   Number of children: Not on file   Years of education: Not on file   Highest education level: Not on file  Occupational History   Not on file  Tobacco Use   Smoking status: Former    Types: Cigarettes    Quit date: 10/06/2017    Years since quitting: 4.4   Smokeless tobacco: Never  Substance and Sexual Activity   Alcohol use: Not Currently   Drug use: Yes    Types: Marijuana   Sexual activity: Not on file  Other Topics Concern   Not on file  Social History Narrative   Not on file   Social Determinants of Health   Financial Resource Strain: Low Risk  (01/18/2022)   Overall Financial Resource Strain (CARDIA)    Difficulty of Paying Living Expenses: Not hard at all  Food Insecurity: No Food Insecurity (01/18/2022)   Hunger Vital Sign    Worried About Running Out of Food in the Last Year: Never true    Ran Out of Food in the Last Year: Never true  Recent Concern: Food Insecurity - Food Insecurity Present (12/07/2021)   Hunger Vital Sign    Worried About Running Out of Food in the Last Year: Sometimes true    Ran Out of Food in the Last Year: Never true  Transportation Needs: No Transportation Needs (01/18/2022)   PRAPARE - Hydrologist (Medical): No    Lack of Transportation (Non-Medical): No  Recent Concern: Transportation Needs - Unmet Transportation Needs (12/31/2021)   PRAPARE - Transportation     Lack of Transportation (Medical): Yes    Lack of Transportation (Non-Medical): Yes  Physical Activity: Inactive (01/18/2022)   Exercise Vital Sign    Days of Exercise per Week: 0 days    Minutes of Exercise per Session: 0 min  Stress: Stress Concern Present (01/18/2022)   [place]    Feeling of Stress : To some extent  Social Connections: Socially Isolated (01/18/2022)   Social Connection and Isolation Panel [NHANES]    Frequency of Communication with Friends and Family: More than three times a week    Frequency of Social Gatherings with Friends and Family: More than three times a week    Attends Religious Services: Never  Active Member of Clubs or Organizations: No    Attends Archivist Meetings: Never    Marital Status: Never married  Intimate Partner Violence: Not At Risk (01/18/2022)   Humiliation, Afraid, Rape, and Kick questionnaire    Fear of Current or Ex-Partner: No    Emotionally Abused: No    Physically Abused: No    Sexually Abused: No    Physical Exam      Future Appointments  Date Time Provider [place]  04/08/2022 10:20 AM Bensimhon, Shaune Pascal, MD [place], [facility] Paramedic  03/03/22

## 2022-03-09 ENCOUNTER — Telehealth (HOSPITAL_COMMUNITY): Payer: Self-pay | Admitting: Emergency Medicine

## 2022-03-09 NOTE — Telephone Encounter (Signed)
Called and spoke with Thomas West to remind him of home visit scheduled in the morning @ 9:00.  He agrees to same.    Renee Ramus, [place] 03/09/2022

## 2022-03-10 ENCOUNTER — Other Ambulatory Visit (HOSPITAL_COMMUNITY): Payer: Self-pay | Admitting: Emergency Medicine

## 2022-03-10 ENCOUNTER — Other Ambulatory Visit (HOSPITAL_COMMUNITY): Payer: Self-pay

## 2022-03-10 NOTE — Progress Notes (Unsigned)
Paramedicine Encounter    Patient ID: Thomas West, male    DOB: [birth year], 61 y.o.   MRN: [id number]   BP (!) 160/100 (BP Location: Right Arm, Patient Position: Sitting, Cuff Size: Normal)   Pulse 67   Resp 16   Wt 155 lb (70.3 kg)   SpO2 98%   BMI 22.89 kg/m  Weight yesterday-not taken Last visit weight-152lb  ATF Mr. Hodgman A&O x 4, skin W&D w/ good color.  Patient Care Team: [name], [name], MD as PCP - General (Family Medicine) Josue Hector, MD as PCP - Cardiology (Cardiology)  Patient Active Problem List   Diagnosis Date Noted   Polyneuropathy associated with underlying disease ([place]) 01/15/2022   Atherosclerosis of aorta ([place]) 01/15/2022   History of CVA (cerebrovascular accident) without residual deficits 12/24/2021   AKI (acute kidney injury) ([place]) 12/13/2021   Diarrhea 12/13/2021   Shortness of breath 12/13/2021   Chest pain 12/13/2021   Nausea and vomiting 12/13/2021   Heart failure ([place]) 12/08/2021   Hypertension associated with diabetes ([place]) 07/12/2021   Chronic kidney disease, stage 3a ([place]) 07/12/2021   Hyperkalemia 07/12/2021   CHF exacerbation ([place]) 04/20/2021   Acute on chronic combined systolic and diastolic CHF (congestive heart failure) ([place]) 03/09/2021   Acute on chronic HFrEF (heart failure with reduced ejection fraction) ([place]) 03/08/2021   HFrEF (heart failure with reduced ejection fraction) ([place]) 02/03/2021   Diabetes mellitus ([place]) 03/27/2019   Hyperlipidemia associated with type 2 diabetes mellitus ([place]) 03/27/2019   CAD (coronary artery disease) 03/05/2019   History of MI (myocardial infarction) 03/05/2019   Type 2 diabetes mellitus with diabetic neuropathy, unspecified ([place]) 03/05/2019   HLD (hyperlipidemia) 03/05/2019   GERD (gastroesophageal reflux disease) 03/05/2019   Hypertension, essential, benign 03/05/2019    Current Outpatient Medications:    atorvastatin (LIPITOR) 80 MG tablet, Take 1 tablet (80 mg total) by mouth daily.,  Disp: 30 tablet, Rfl: 6   carvedilol (COREG) 6.25 MG tablet, Take 1 tablet (6.25 mg total) by mouth EVERY 12 HOURS (10AM &10PM), Disp: 60 tablet, Rfl: 6   clopidogrel (PLAVIX) 75 MG tablet, Take 1 tablet (75 mg total) by mouth daily., Disp: 30 tablet, Rfl: 3   Continuous Blood Gluc Receiver ([place]) DEVI, Use to check blood sugars, Disp: 3 each, Rfl: 3   Continuous Blood Gluc Sensor (DEXCOM G7 SENSOR) MISC, Use to check blood sugars, Disp: 3 each, Rfl: 3   empagliflozin (JARDIANCE) 10 MG TABS tablet, Take 1 tablet (10 mg total) by mouth daily., Disp: 30 tablet, Rfl: 3   hydrALAZINE (APRESOLINE) 50 MG tablet, Take 1 tablet (50 mg total) by mouth 3 (three) times daily., Disp: 90 tablet, Rfl: 6   isosorbide mononitrate (IMDUR) 30 MG 24 hr tablet, Take 1 tablet (30 mg total) by mouth daily., Disp: 30 tablet, Rfl: 6   sacubitril-valsartan (ENTRESTO) 24-26 MG, Take 1 tablet by mouth 2 (two) times daily., Disp: 60 tablet, Rfl: 3   spironolactone (ALDACTONE) 25 MG tablet, Take 1 tablet (25 mg total) by mouth daily., Disp: 30 tablet, Rfl: 11   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 Swallow whole. (Patient not taking: Reported on 03/10/2022), Disp: 30 tablet, Rfl: 12  cyanocobalamin 1000 MCG tablet, Take 1 tablet (1,000 mcg total) by mouth daily. (Patient not taking: Reported on 03/10/2022), Disp: 30 tablet, Rfl: 3   gabapentin (NEURONTIN) 400 MG capsule, Take 400 mg by mouth daily. (Patient not taking: Reported on 02/09/2022), Disp: , Rfl:    hydrocortisone cream 1 %, Apply to affected area 2 times daily (Patient not taking: Reported on 02/09/2022), Disp: 28 g, Rfl: 0   senna-docusate (SENOKOT-S) 8.6-50 MG tablet, Take 1 tablet by mouth at bedtime.  (Patient not taking: Reported on 01/27/2022), Disp: 20 tablet, Rfl: 0 Allergies  Allergen Reactions   Bee Venom Anaphylaxis, Swelling and Other (See Comments)    Swells all over      Social History   Socioeconomic History   Marital status: Single    Spouse name: Not on file   Number of children: Not on file   Years of education: Not on file   Highest education level: Not on file  Occupational History   Not on file  Tobacco Use   Smoking status: Former    Types: Cigarettes    Quit date: 10/06/2017    Years since quitting: 4.4   Smokeless tobacco: Never  Substance and Sexual Activity   Alcohol use: Not Currently   Drug use: Yes    Types: Marijuana   Sexual activity: Not on file  Other Topics Concern   Not on file  Social History Narrative   Not on file   Social Determinants of Health   Financial Resource Strain: Low Risk  (01/18/2022)   Overall Financial Resource Strain (CARDIA)    Difficulty of Paying Living Expenses: Not hard at all  Food Insecurity: No Food Insecurity (01/18/2022)   Hunger Vital Sign    Worried About Running Out of Food in the Last Year: Never true    Ran Out of Food in the Last Year: Never true  Recent Concern: Food Insecurity - Food Insecurity Present (12/07/2021)   Hunger Vital Sign    Worried About Running Out of Food in the Last Year: Sometimes true    Ran Out of Food in the Last Year: Never true  Transportation Needs: No Transportation Needs (01/18/2022)   PRAPARE - Hydrologist (Medical): No    Lack of Transportation (Non-Medical): No  Recent Concern: Transportation Needs - Unmet Transportation Needs (12/31/2021)   PRAPARE - Transportation    Lack of Transportation (Medical): Yes    Lack of Transportation (Non-Medical): Yes  Physical Activity: Inactive (01/18/2022)   Exercise Vital Sign    Days of Exercise per Week: 0 days    Minutes of Exercise per Session: 0 min  Stress: Stress Concern Present (01/18/2022)    [place]    Feeling of Stress : To some extent  Social Connections: Socially Isolated (01/18/2022)   Social Connection and Isolation Panel [NHANES]    Frequency of Communication with Friends and Family: More than three times a week    Frequency of Social Gatherings with Friends and Family: More than three times a week    Attends Religious Services: Never    Marine scientist or Organizations: No    Attends Archivist Meetings: Never    Marital Status: Never married  Intimate Partner Violence: Not At Risk (01/18/2022)   Humiliation, Afraid, Rape, and Kick questionnaire    Fear of Current or Ex-Partner: No    Emotionally Abused: No    Physically Abused:  No    Sexually Abused: No    Physical Exam      Future Appointments  Date Time Provider [place]  04/08/2022 10:20 AM Bensimhon, Shaune Pascal, MD [place], [facility] Paramedic  03/10/22

## 2022-03-16 ENCOUNTER — Other Ambulatory Visit (HOSPITAL_COMMUNITY): Payer: Self-pay

## 2022-03-17 ENCOUNTER — Other Ambulatory Visit (HOSPITAL_COMMUNITY): Payer: Self-pay | Admitting: Emergency Medicine

## 2022-03-17 ENCOUNTER — Other Ambulatory Visit (HOSPITAL_COMMUNITY): Payer: Self-pay

## 2022-03-17 NOTE — Progress Notes (Signed)
Paramedicine Encounter    Patient ID: Thomas West, male    DOB: December 19, 1960, 61 y.o.   MRN: [id number]   BP 120/80 (BP Location: Left Arm, Patient Position: Sitting, Cuff Size: Normal)   Pulse 60   Wt 158 lb (71.7 kg)   SpO2 96%   BMI 23.33 kg/m  Weight yesterday-not taken Last visit weight-155lb Cbg 148  ATF [name] A&O x 4, skin W&D w/ good color.  He was sitting outside his apartment enjoying the beautiful weather on my arrival.  Pt denies chest pain or SOB.  Lung sounds clear throughout and no edema noted to his extremities.  He has taken all of his AM & PM meds as described but missed every one of his noon doses of Hydralazine.  I again discussed that he needs to make a concerted effort to take ALL meds and to set an alarm to help him remember his noontime dose. Med box reconciled.  No refills needed at this time.  Home visit complete.    Renee Ramus, [place] 03/17/2022    Patient Care Team: [name], [name], MD as PCP - General (Family Medicine) Josue Hector, MD as PCP - Cardiology (Cardiology)  Patient Active Problem List   Diagnosis Date Noted   Polyneuropathy associated with underlying disease ([place]) 01/15/2022   Atherosclerosis of aorta ([place]) 01/15/2022   History of CVA (cerebrovascular accident) without residual deficits 12/24/2021   AKI (acute kidney injury) ([place]) 12/13/2021   Diarrhea 12/13/2021   Shortness of breath 12/13/2021   Chest pain 12/13/2021   Nausea and vomiting 12/13/2021   Heart failure ([place]) 12/08/2021   Hypertension associated with diabetes ([place]) 07/12/2021   Chronic kidney disease, stage 3a ([place]) 07/12/2021   Hyperkalemia 07/12/2021   CHF exacerbation ([place]) 04/20/2021   Acute on chronic combined systolic and diastolic CHF (congestive heart failure) ([place]) 03/09/2021   Acute on chronic HFrEF (heart failure with reduced ejection fraction) ([place]) 03/08/2021   HFrEF (heart failure with reduced ejection fraction) ([place])  02/03/2021   Diabetes mellitus ([place]) 03/27/2019   Hyperlipidemia associated with type 2 diabetes mellitus ([place]) 03/27/2019   CAD (coronary artery disease) 03/05/2019   History of MI (myocardial infarction) 03/05/2019   Type 2 diabetes mellitus with diabetic neuropathy, unspecified ([place]) 03/05/2019   HLD (hyperlipidemia) 03/05/2019   GERD (gastroesophageal reflux disease) 03/05/2019   Hypertension, essential, benign 03/05/2019    Current Outpatient Medications:    aspirin EC 81 MG tablet, Take 1 tablet (81 mg total) by mouth daily. Swallow whole., Disp: 30 tablet, Rfl: 12   atorvastatin (LIPITOR) 80 MG tablet, Take 1 tablet (80 mg total) by mouth daily., Disp: 30 tablet, Rfl: 6   carvedilol (COREG) 6.25 MG tablet, Take 1 tablet (6.25 mg total) by mouth EVERY 12 HOURS (10AM &10PM), Disp: 60 tablet, Rfl: 6   clopidogrel (PLAVIX) 75 MG tablet, Take 1 tablet (75 mg total) by mouth daily., Disp: 30 tablet, Rfl: 3   Continuous Blood Gluc Receiver ([name]) DEVI, Use to check blood sugars, Disp: 3 each, Rfl: 3   Continuous Blood Gluc Sensor (DEXCOM G7 SENSOR) MISC, Use to check blood sugars, Disp: 3 each, Rfl: 3   cyanocobalamin 1000 MCG tablet, Take 1 tablet (1,000 mcg total) by mouth daily., Disp: 30 tablet, Rfl: 3   empagliflozin (JARDIANCE) 10 MG TABS tablet, Take 1 tablet (10 mg total) by mouth daily., Disp: 30 tablet, Rfl: 3   hydrALAZINE (APRESOLINE) 50 MG tablet, Take 1 tablet (50 mg  total) by mouth 3 (three) times daily., Disp: 90 tablet, Rfl: 6   isosorbide mononitrate (IMDUR) 30 MG 24 hr tablet, Take 1 tablet (30 mg total) by mouth daily., Disp: 30 tablet, Rfl: 6   sacubitril-valsartan (ENTRESTO) 24-26 MG, Take 1 tablet by mouth 2 (two) times daily., Disp: 60 tablet, Rfl: 3   spironolactone (ALDACTONE) 25 MG tablet, Take 1 tablet (25 mg total) by mouth daily., Disp: 30 tablet, Rfl: 11   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 (Patient not taking: Reported on 02/09/2022), Disp: , Rfl:    hydrocortisone cream 1 %, Apply to affected area 2 times daily (Patient not taking: Reported on 02/09/2022), Disp: 28 g, Rfl: 0   senna-docusate (SENOKOT-S) 8.6-50 MG tablet, Take 1 tablet by mouth at bedtime. (Patient not taking: Reported on 01/27/2022), Disp: 20 tablet, Rfl: 0 Allergies  Allergen Reactions   Bee Venom Anaphylaxis, Swelling and Other (See Comments)    Swells all over      Social History   Socioeconomic History   Marital status: Single    Spouse name: Not on file   Number of children: Not on file   Years of education: Not on file   Highest education level: Not on file  Occupational History   Not on file  Tobacco Use   Smoking status: Former    Types: Cigarettes    Quit date: 10/06/2017    Years since quitting: 4.4   Smokeless tobacco: Never  Substance and Sexual Activity   Alcohol use: Not Currently   Drug use: Yes    Types: Marijuana   Sexual activity: Not on file  Other Topics Concern   Not on file  Social History Narrative   Not on file   Social Determinants of Health   Financial Resource Strain: Low Risk  (01/18/2022)   Overall Financial Resource Strain (CARDIA)    Difficulty of Paying Living Expenses: Not hard at all  Food Insecurity: No Food Insecurity (01/18/2022)   Hunger Vital Sign    Worried About Running Out of Food in the Last Year: Never true    Ran Out of Food in the Last Year: Never true  Recent Concern: Food Insecurity - Food Insecurity Present (12/07/2021)   Hunger Vital Sign    Worried About Running Out of Food in the Last Year: Sometimes true    Ran Out of Food in the Last Year: Never true  Transportation Needs: No Transportation Needs (01/18/2022)   PRAPARE - Radiographer, therapeutic (Medical): No    Lack of Transportation (Non-Medical): No  Recent Concern: Transportation Needs - Unmet Transportation Needs (12/31/2021)   PRAPARE - Transportation    Lack of Transportation (Medical): Yes    Lack of Transportation (Non-Medical): Yes  Physical Activity: Inactive (01/18/2022)   Exercise Vital Sign    Days of Exercise per Week: 0 days    Minutes of Exercise per Session: 0 min  Stress: Stress Concern Present (01/18/2022)   [place]    Feeling of Stress : To some extent  Social Connections: Socially Isolated (01/18/2022)   Social Connection and Isolation Panel [NHANES]    Frequency of Communication with  Friends and Family: More than three times a week    Frequency of Social Gatherings with Friends and Family: More than three times a week    Attends Religious Services: Never    Marine scientist or Organizations: No    Attends Archivist Meetings: Never    Marital Status: Never married  Intimate Partner Violence: Not At Risk (01/18/2022)   Humiliation, Afraid, Rape, and Kick questionnaire    Fear of Current or Ex-Partner: No    Emotionally Abused: No    Physically Abused: No    Sexually Abused: No    Physical Exam      Future Appointments  Date Time Provider [place]  04/08/2022 10:20 AM Bensimhon, Shaune Pascal, MD [place] None       Renee Ramus, [place] [facility] Paramedic  03/17/22

## 2022-03-18 ENCOUNTER — Other Ambulatory Visit (HOSPITAL_COMMUNITY): Payer: Self-pay

## 2022-03-24 ENCOUNTER — Other Ambulatory Visit (HOSPITAL_COMMUNITY): Payer: Self-pay

## 2022-03-26 ENCOUNTER — Other Ambulatory Visit (HOSPITAL_COMMUNITY): Payer: Self-pay

## 2022-03-26 ENCOUNTER — Other Ambulatory Visit (HOSPITAL_COMMUNITY): Payer: Self-pay | Admitting: Emergency Medicine

## 2022-03-26 NOTE — Progress Notes (Signed)
Paramedicine Encounter    Patient ID: Thomas West, male    DOB: December 21, 1960, 61 y.o.   MRN: [id number]   There were no vitals taken for this visit. Weight yesterday-not taken Last visit weight-158lb Cbg 110  [place], Torsemide refills [facility] ASA & B12 [employer].   Patient Care Team: [name], [name], MD as PCP - General (Family Medicine) Wendall Stade, MD as PCP - Cardiology (Cardiology)  Patient Active Problem List   Diagnosis Date Noted  . Polyneuropathy associated with underlying disease (HCC) 01/15/2022  . Atherosclerosis of aorta (HCC) 01/15/2022  . History of CVA (cerebrovascular accident) without residual deficits 12/24/2021  . AKI (acute kidney injury) (HCC) 12/13/2021  . Diarrhea 12/13/2021  . Shortness of breath 12/13/2021  . Chest pain 12/13/2021  . Nausea and vomiting 12/13/2021  . Heart failure (HCC) 12/08/2021  . Hypertension associated with diabetes (HCC) 07/12/2021  . Chronic kidney disease, stage 3a (HCC) 07/12/2021  . Hyperkalemia 07/12/2021  . CHF exacerbation (HCC) 04/20/2021  . Acute on chronic combined systolic and diastolic CHF (congestive heart failure) (HCC) 03/09/2021  . Acute on chronic HFrEF (heart failure with reduced ejection fraction) (HCC) 03/08/2021  . HFrEF (heart failure with reduced ejection fraction) (HCC) 02/03/2021  . Diabetes mellitus (HCC) 03/27/2019  . Hyperlipidemia associated with type 2 diabetes mellitus (HCC) 03/27/2019  . CAD (coronary artery disease) 03/05/2019  . History of MI (myocardial infarction) 03/05/2019  . Type 2 diabetes mellitus with diabetic neuropathy, unspecified (HCC) 03/05/2019  . HLD (hyperlipidemia) 03/05/2019  . GERD (gastroesophageal reflux disease) 03/05/2019  . Hypertension, essential, benign 03/05/2019    Current Outpatient Medications:  .  aspirin EC 81 MG tablet, Take 1 tablet (81 mg total) by mouth daily. Swallow whole., Disp: 30 tablet, Rfl: 12 .  atorvastatin (LIPITOR) 80 MG tablet,  Take 1 tablet (80 mg total) by mouth daily., Disp: 30 tablet, Rfl: 6 .  carvedilol (COREG) 6.25 MG tablet, Take 1 tablet (6.25 mg total) by mouth EVERY 12 HOURS (10AM &10PM), Disp: 60 tablet, Rfl: 6 .  clopidogrel (PLAVIX) 75 MG tablet, Take 1 tablet (75 mg total) by mouth daily., Disp: 30 tablet, Rfl: 3 .  Continuous Blood Gluc Receiver (DEXCOM G7 RECEIVER) DEVI, Use to check blood sugars, Disp: 3 each, Rfl: 3 .  Continuous Blood Gluc Sensor (DEXCOM G7 SENSOR) MISC, Use to check blood sugars, Disp: 3 each, Rfl: 3 .  cyanocobalamin 1000 MCG tablet, Take 1 tablet (1,000 mcg total) by mouth daily., Disp: 30 tablet, Rfl: 3 .  empagliflozin (JARDIANCE) 10 MG TABS tablet, Take 1 tablet (10 mg total) by mouth daily., Disp: 30 tablet, Rfl: 3 .  gabapentin (NEURONTIN) 400 MG capsule, Take 400 mg by mouth daily. (Patient not taking: Reported on 02/09/2022), Disp: , Rfl:  .  hydrALAZINE (APRESOLINE) 50 MG tablet, Take 1 tablet (50 mg total) by mouth 3 (three) times daily., Disp: 90 tablet, Rfl: 6 .  hydrocortisone cream 1 %, Apply to affected area 2 times daily (Patient not taking: Reported on 02/09/2022), Disp: 28 g, Rfl: 0 .  isosorbide mononitrate (IMDUR) 30 MG 24 hr tablet, Take 1 tablet (30 mg total) by mouth daily., Disp: 30 tablet, Rfl: 6 .  sacubitril-valsartan (ENTRESTO) 24-26 MG, Take 1 tablet by mouth 2 (two) times daily., Disp: 60 tablet, Rfl: 3 .  senna-docusate (SENOKOT-S) 8.6-50 MG tablet, Take 1 tablet by mouth at bedtime. (Patient not taking: Reported on 01/27/2022), Disp: 20 tablet, Rfl: 0 .  spironolactone (ALDACTONE) 25  MG tablet, Take 1 tablet (25 mg total) by mouth daily., Disp: 30 tablet, Rfl: 11 .  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  torsemide (DEMADEX) 20 MG tablet, Take 1 tablet (20 mg total) by mouth every Monday, Wednesday, and Friday., Disp: 30 tablet, Rfl: 3 Allergies  Allergen Reactions  . Bee Venom Anaphylaxis, Swelling and Other (See  Comments)    Swells all over      Social History   Socioeconomic History  . Marital status: Single    Spouse name: Not on file  . Number of children: Not on file  . Years of education: Not on file  . Highest education level: Not on file  Occupational History  . Not on file  Tobacco Use  . Smoking status: Former    Types: Cigarettes    Quit date: 10/06/2017    Years since quitting: 4.4  . Smokeless tobacco: Never  Substance and Sexual Activity  . Alcohol use: Not Currently  . Drug use: Yes    Types: Marijuana  . Sexual activity: Not on file  Other Topics Concern  . Not on file  Social History Narrative  . Not on file   Social Determinants of Health   Financial Resource Strain: Low Risk  (01/18/2022)   Overall Financial Resource Strain (CARDIA)   . Difficulty of Paying Living Expenses: Not hard at all  Food Insecurity: No Food Insecurity (01/18/2022)   Hunger Vital Sign   . Worried About Charity fundraiser in the Last Year: Never true   . Ran Out of Food in the Last Year: Never true  Recent Concern: Food Insecurity - Food Insecurity Present (12/07/2021)   Hunger Vital Sign   . Worried About Charity fundraiser in the Last Year: Sometimes true   . Ran Out of Food in the Last Year: Never true  Transportation Needs: No Transportation Needs (01/18/2022)   PRAPARE - Transportation   . Lack of Transportation (Medical): No   . Lack of Transportation (Non-Medical): No  Recent Concern: Transportation Needs - Unmet Transportation Needs (12/31/2021)   PRAPARE - Transportation   . Lack of Transportation (Medical): Yes   . Lack of Transportation (Non-Medical): Yes  Physical Activity: Inactive (01/18/2022)   Exercise Vital Sign   . Days of Exercise per Week: 0 days   . Minutes of Exercise per Session: 0 min  Stress: Stress Concern Present (01/18/2022)   [place]   . Feeling of Stress : To some extent  Social  Connections: Socially Isolated (01/18/2022)   Social Connection and Isolation Panel [NHANES]   . Frequency of Communication with Friends and Family: More than three times a week   . Frequency of Social Gatherings with Friends and Family: More than three times a week   . Attends Religious Services: Never   . Active Member of Clubs or Organizations: No   . Attends Archivist Meetings: Never   . Marital Status: Never married  Intimate Partner Violence: Not At Risk (01/18/2022)   Humiliation, Afraid, Rape, and Kick questionnaire   . Fear of Current or Ex-Partner: No   . Emotionally Abused: No   . Physically Abused: No   . Sexually Abused: No    Physical Exam      Future Appointments  Date Time Provider [place]  04/08/2022 10:20 AM Bensimhon, Shaune Pascal, MD [facility] None  Beatrix Shipper, EMT-Paramedic [phone] [facility] Paramedic  03/26/22

## 2022-04-01 ENCOUNTER — Other Ambulatory Visit (HOSPITAL_COMMUNITY): Payer: Self-pay

## 2022-04-01 ENCOUNTER — Other Ambulatory Visit (HOSPITAL_COMMUNITY): Payer: Self-pay | Admitting: Emergency Medicine

## 2022-04-01 NOTE — Progress Notes (Signed)
Paramedicine Encounter    Patient ID: Thomas West, male    DOB: 08/13/1960, 61 y.o.   MRN: [id number]   BP 110/80 (BP Location: Left Arm, Patient Position: Sitting, Cuff Size: Normal)   Pulse 86   Resp 14   Wt 153 lb (69.4 kg)   SpO2 100%   BMI 22.59 kg/m  Weight yesterday-not taken Last visit weight-157lb  [name] A&O x 4, skin W&D w/ good color.  [birth year] his birthday and he reports to feeling well.  He has improved with is med compliance but is still miss 2 or 3 noon doses of his Hydralazine.  Med box reconciled x 1 week.  Called in refill for Jardiance.  Thomas West denies chest pain or SOB.  Lung sounds clear and equal bilat.  No edema noted.   Reminded him of his appointment with Dr. Gala Romney 11/16 @ 10:20.  Home visit complete.   Beatrix Shipper, EMT-Paramedic [phone] 04/01/2022  Patient Care Team: [name], [name], MD as PCP - General (Family Medicine) Wendall Stade, MD as PCP - Cardiology (Cardiology)  Patient Active Problem List   Diagnosis Date Noted   Polyneuropathy associated with underlying disease (HCC) 01/15/2022   Atherosclerosis of aorta (HCC) 01/15/2022   History of CVA (cerebrovascular accident) without residual deficits 12/24/2021   AKI (acute kidney injury) (HCC) 12/13/2021   Diarrhea 12/13/2021   Shortness of breath 12/13/2021   Chest pain 12/13/2021   Nausea and vomiting 12/13/2021   Heart failure (HCC) 12/08/2021   Hypertension associated with diabetes (HCC) 07/12/2021   Chronic kidney disease, stage 3a (HCC) 07/12/2021   Hyperkalemia 07/12/2021   CHF exacerbation (HCC) 04/20/2021   Acute on chronic combined systolic and diastolic CHF (congestive heart failure) (HCC) 03/09/2021   Acute on chronic HFrEF (heart failure with reduced ejection fraction) (HCC) 03/08/2021   HFrEF (heart failure with reduced ejection fraction) (HCC) 02/03/2021   Diabetes mellitus (HCC) 03/27/2019   Hyperlipidemia associated with type 2 diabetes mellitus (HCC)  03/27/2019   CAD (coronary artery disease) 03/05/2019   History of MI (myocardial infarction) 03/05/2019   Type 2 diabetes mellitus with diabetic neuropathy, unspecified (HCC) 03/05/2019   HLD (hyperlipidemia) 03/05/2019   GERD (gastroesophageal reflux disease) 03/05/2019   Hypertension, essential, benign 03/05/2019    Current Outpatient Medications:    aspirin EC 81 MG tablet, Take 1 tablet (81 mg total) by mouth daily. Swallow whole., Disp: 30 tablet, Rfl: 12   atorvastatin (LIPITOR) 80 MG tablet, Take 1 tablet (80 mg total) by mouth daily., Disp: 30 tablet, Rfl: 6   carvedilol (COREG) 6.25 MG tablet, Take 1 tablet (6.25 mg total) by mouth EVERY 12 HOURS (10AM &10PM), Disp: 60 tablet, Rfl: 6   clopidogrel (PLAVIX) 75 MG tablet, Take 1 tablet (75 mg total) by mouth daily., Disp: 30 tablet, Rfl: 3   Continuous Blood Gluc Receiver (DEXCOM G7 RECEIVER) DEVI, Use to check blood sugars, Disp: 3 each, Rfl: 3   Continuous Blood Gluc Sensor (DEXCOM G7 SENSOR) MISC, Use to check blood sugars, Disp: 3 each, Rfl: 3   cyanocobalamin 1000 MCG tablet, Take 1 tablet (1,000 mcg total) by mouth daily., Disp: 30 tablet, Rfl: 3   empagliflozin (JARDIANCE) 10 MG TABS tablet, Take 1 tablet (10 mg total) by mouth daily., Disp: 30 tablet, Rfl: 3   hydrALAZINE (APRESOLINE) 50 MG tablet, Take 1 tablet (50 mg total) by mouth 3 (three) times daily., Disp: 90 tablet, Rfl: 6   isosorbide mononitrate (IMDUR) 30 MG  24 hr tablet, Take 1 tablet (30 mg total) by mouth daily., Disp: 30 tablet, Rfl: 6   sacubitril-valsartan (ENTRESTO) 24-26 MG, Take 1 tablet by mouth 2 (two) times daily., Disp: 60 tablet, Rfl: 3   spironolactone (ALDACTONE) 25 MG tablet, Take 1 tablet (25 mg total) by mouth daily., Disp: 30 tablet, Rfl: 11   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 (Patient not taking: Reported on 04/01/2022), Disp: , Rfl:     hydrocortisone cream 1 %, Apply to affected area 2 times daily (Patient not taking: Reported on 02/09/2022), Disp: 28 g, Rfl: 0   senna-docusate (SENOKOT-S) 8.6-50 MG tablet, Take 1 tablet by mouth at bedtime. (Patient not taking: Reported on 01/27/2022), Disp: 20 tablet, Rfl: 0   torsemide (DEMADEX) 20 MG tablet, Take 1 tablet (20 mg total) by mouth every Monday, Wednesday, and Friday., Disp: 30 tablet, Rfl: 3 Allergies  Allergen Reactions   Bee Venom Anaphylaxis, Swelling and Other (See Comments)    Swells all over      Social History   Socioeconomic History   Marital status: Single    Spouse name: Not on file   Number of children: Not on file   Years of education: Not on file   Highest education level: Not on file  Occupational History   Not on file  Tobacco Use   Smoking status: Former    Types: Cigarettes    Quit date: 10/06/2017    Years since quitting: 4.4   Smokeless tobacco: Never  Substance and Sexual Activity   Alcohol use: Not Currently   Drug use: Yes    Types: Marijuana   Sexual activity: Not on file  Other Topics Concern   Not on file  Social History Narrative   Not on file   Social Determinants of Health   Financial Resource Strain: Low Risk  (01/18/2022)   Overall Financial Resource Strain (CARDIA)    Difficulty of Paying Living Expenses: Not hard at all  Food Insecurity: No Food Insecurity (01/18/2022)   Hunger Vital Sign    Worried About Running Out of Food in the Last Year: Never true    Ran Out of Food in the Last Year: Never true  Recent Concern: Food Insecurity - Food Insecurity Present (12/07/2021)   Hunger Vital Sign    Worried About Running Out of Food in the Last Year: Sometimes true    Ran Out of Food in the Last Year: Never true  Transportation Needs: No Transportation Needs (01/18/2022)   PRAPARE - Administrator, Civil Service (Medical): No    Lack of Transportation (Non-Medical): No  Recent Concern: Transportation Needs -  Unmet Transportation Needs (12/31/2021)   PRAPARE - Transportation    Lack of Transportation (Medical): Yes    Lack of Transportation (Non-Medical): Yes  Physical Activity: Inactive (01/18/2022)   Exercise Vital Sign    Days of Exercise per Week: 0 days    Minutes of Exercise per Session: 0 min  Stress: Stress Concern Present (01/18/2022)   [employer] of Occupational Health - Occupational Stress Questionnaire    Feeling of Stress : To some extent  Social Connections: Socially Isolated (01/18/2022)   Social Connection and Isolation Panel [NHANES]    Frequency of Communication with Friends and Family: More than three times a week    Frequency of Social Gatherings with Friends and  Family: More than three times a week    Attends Religious Services: Never    Active Member of Clubs or Organizations: No    Attends Banker Meetings: Never    Marital Status: Never married  Intimate Partner Violence: Not At Risk (01/18/2022)   Humiliation, Afraid, Rape, and Kick questionnaire    Fear of Current or Ex-Partner: No    Emotionally Abused: No    Physically Abused: No    Sexually Abused: No    Physical Exam      Future Appointments  Date Time Provider Department Center  04/08/2022 10:20 AM Bensimhon, Bevelyn Buckles, MD [facility] None       Beatrix Shipper, EMT-Paramedic [phone] [facility] Paramedic  04/01/22

## 2022-04-02 ENCOUNTER — Other Ambulatory Visit (HOSPITAL_COMMUNITY): Payer: Self-pay

## 2022-04-08 ENCOUNTER — Other Ambulatory Visit (HOSPITAL_COMMUNITY): Payer: Self-pay | Admitting: Emergency Medicine

## 2022-04-08 ENCOUNTER — Encounter (HOSPITAL_COMMUNITY): Payer: Self-pay | Admitting: Internal Medicine

## 2022-04-08 ENCOUNTER — Ambulatory Visit (HOSPITAL_COMMUNITY)
Admission: RE | Admit: 2022-04-08 | Discharge: 2022-04-08 | Disposition: A | Discharge status: Home / Self Care | Payer: Medicare Other | Source: Ambulatory Visit | Attending: Internal Medicine | Admitting: Internal Medicine

## 2022-04-08 ENCOUNTER — Other Ambulatory Visit (HOSPITAL_COMMUNITY): Payer: Self-pay

## 2022-04-08 ENCOUNTER — Encounter: Payer: Medicare Other | Admitting: *Deleted

## 2022-04-08 VITALS — BP 100/60 | HR 75 | Wt 156.8 lb

## 2022-04-08 DIAGNOSIS — N183 Chronic kidney disease, stage 3 unspecified: Secondary | ICD-10-CM | POA: Diagnosis not present

## 2022-04-08 DIAGNOSIS — G629 Polyneuropathy, unspecified: Secondary | ICD-10-CM

## 2022-04-08 DIAGNOSIS — I493 Ventricular premature depolarization: Secondary | ICD-10-CM

## 2022-04-08 DIAGNOSIS — I43 Cardiomyopathy in diseases classified elsewhere: Secondary | ICD-10-CM

## 2022-04-08 DIAGNOSIS — Z006 Encounter for examination for normal comparison and control in clinical research program: Secondary | ICD-10-CM

## 2022-04-08 DIAGNOSIS — E854 Organ-limited amyloidosis: Secondary | ICD-10-CM | POA: Diagnosis not present

## 2022-04-08 DIAGNOSIS — I5020 Unspecified systolic (congestive) heart failure: Secondary | ICD-10-CM | POA: Diagnosis not present

## 2022-04-08 LAB — BASIC METABOLIC PANEL
Anion gap: 9 (ref 5–15)
BUN: 29 mg/dL — ABNORMAL HIGH (ref 6–20)
CO2: 23 mmol/L (ref 22–32)
Calcium: 8.6 mg/dL — ABNORMAL LOW (ref 8.9–10.3)
Chloride: 107 mmol/L (ref 98–111)
Creatinine, Ser: 1.84 mg/dL — ABNORMAL HIGH (ref 0.61–1.24)
GFR, Estimated: 42 mL/min — ABNORMAL LOW (ref >60)
Glucose, Bld: 180 mg/dL — ABNORMAL HIGH (ref 70–99)
Potassium: 4.2 mmol/L (ref 3.5–5.1)
Sodium: 139 mmol/L (ref 135–145)

## 2022-04-08 LAB — CBC
HCT: 39.6 % (ref 39.0–52.0)
Hemoglobin: 13.2 g/dL (ref 13.0–17.0)
MCH: 28.2 pg (ref 26.0–34.0)
MCHC: 33.3 g/dL (ref 30.0–36.0)
MCV: 84.6 fL (ref 80.0–100.0)
Platelets: 342 10*3/uL (ref 150–400)
RBC: 4.68 MIL/uL (ref 4.22–5.81)
RDW: 17 % — ABNORMAL HIGH (ref 11.5–15.5)
WBC: 7.3 10*3/uL (ref 4.0–10.5)
nRBC: 0 % (ref 0.0–0.2)

## 2022-04-08 LAB — BRAIN NATRIURETIC PEPTIDE: B Natriuretic Peptide: 81.1 pg/mL (ref 0.0–100.0)

## 2022-04-08 NOTE — Patient Instructions (Signed)
There has been no changes to your medications.  Labs done today, your results will be available in MyChart, we will contact you for abnormal readings.  Your physician has requested that you have an echocardiogram. Echocardiography is a painless test that uses sound waves to create images of your heart. It provides your doctor with information about the size and shape of your heart and how well your heart's chambers and valves are working. This procedure takes approximately one hour. There are no restrictions for this procedure. Please do NOT wear cologne, perfume, aftershave, or lotions (deodorant is allowed). Please arrive 15 minutes prior to your appointment time.  You have  been referred to Dr. Allena Katz for your neuropathy.  Her office will call you to arrange your appointment.  Your physician recommends that you schedule a follow-up appointment in: 6 months with an echocardiogram ( May 2024)  ** please call the office in February to arrange your follow up appointment **  If you have any questions or concerns before your next appointment please send [place] a message through [place] or call our office at [phone].    TO LEAVE A MESSAGE FOR THE NURSE SELECT OPTION 2, PLEASE LEAVE A MESSAGE INCLUDING: YOUR NAME DATE OF BIRTH CALL BACK NUMBER REASON FOR CALL**this is important as we prioritize the call backs  YOU WILL RECEIVE A CALL BACK THE SAME DAY AS LONG AS YOU CALL BEFORE 4:00 PM  At the [facility], you and your health needs are our priority. As part of our continuing mission to provide you with exceptional heart care, we have created designated Provider Care Teams. These Care Teams include your primary Cardiologist (physician) and Advanced Practice Providers (APPs- Physician Assistants and Nurse Practitioners) who all work together to provide you with the care you need, when you need it.   You may see any of the following providers on your designated Care Team at your  next follow up: Dr Arvilla Meres Dr Marca Ancona Dr. Marcos Eke, NP Robbie Lis, [place] [facility] [place], [place] Brynda Peon, NP Karle Plumber, PharmD   Please be sure to bring in all your medications bottles to every appointment.

## 2022-04-08 NOTE — Progress Notes (Signed)
Advanced Heart Failure Clinic Note   Primary Care: Dr. Betty [name] HF Cardiologist: Dr. Haroldine Laws  HPI: Thomas West is a 61 y.o. male with history of CAD with prior MI and stent in 01/2019 at [facility] in [place] (report not available), cardiomyopathy/chronic systolic HF, uncontrolled DM II, HTN, hx CVA on chart review, CKD.    Moved to [place] from [place] 2 years ago.   Had not medical f/u until he was admitted in 01/60 with a/c systolic HF. Had been out of cardiac medications. Diuresed with IV lasix then transitioned to po lasix 40 mg daily. Started on GDMT with carvedilol, hydralazine, imdur and empagliflozin.    Echo 10/22 with EF 20-25%, RV okay, trivial MR   [facility] 10/22 with nonobstructive CAD and patent RPLV stent. RA mean 5 mmHg, PCWP mean 5 mmHg, Fick CO 7.46/CI 3.85.   He did not show up for [facility] appointment after discharge.   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  Admitted 12/22 with a/c CHF due to noncompliance with GDMT. cMRI strongly suggestive of amyloidosis, GDMT titrated. Admitted 2/23 with a/c CHF after running out of meds x 1 week. Given IV lasix, GDMT restarted. Imdur held with low BP. Seen in ED 3/23 with back pain, felt to be related to MSK. Seen in ED 08/18/21 with SOB, cardiac work up unrevealing.  Admitted 12/07/21-12/10/21 post fall w/ a/c CHF. Diuresed well with IV lasix/metolazone and discharged. He developed an AKI Cr >> 1.2>>1.75, Entresto and spiro were held and he was instructed to restart the next day following discharge. He was seen by paramedicine the following day and he was in no acute distress, taking all meds. Denied CP, SOB and overall feeling well.   Readmitted 12/12/21 w/ 2 days of persistent N/V/D with intermittent CP and SOB. Presented with AKI cr 3.3 2/2 emesis/diarrhea. Responded well to 500 mL NS bolus and  holding of Entresto and spiro. Restarted meds once discharged.   Admitted 8/23 with a/c CHF. Diuresed with IV lasix. Hospitalization c/b with AKI, GDMT initially held; Entresto resumed at low dose, torsemide on MWF. Discharged home, weight 145 lbs.  Today he returns for follow up. Here with DeeDee from Paramedicine. Back still hurts from previous. Struggling with Neuropathy in his feet. No CP, SOB, orthopnea or PND. SBP 100-140 Not taking Noon dose of hydralazine routinely.   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   - cMRI (11/22): LVEF 24% RVEF 21% LGE and ECW suggestive of cardiac amyloidosis  ROS: All systems reviewed and negative except as per HPI.   Past Medical History:  Diagnosis Date   CHF (congestive heart failure) (HCC)    Coronary artery disease    Diabetes mellitus without complication (HCC)    History of MI (myocardial infarction) 03/05/2019   Hypertension    Stroke [facility])    Current Outpatient Medications  Medication Sig Dispense Refill   aspirin EC 81 MG tablet Take 1 tablet (81 mg total) by mouth daily. Swallow whole. 30 tablet 12   atorvastatin (LIPITOR) 80 MG tablet Take 1 tablet (80 mg total) by mouth daily. 30 tablet 6   carvedilol (COREG) 6.25 MG tablet Take 1 tablet (6.25 mg total) by mouth  EVERY 12 HOURS (10AM &10PM) 60 tablet 6   clopidogrel (PLAVIX) 75 MG tablet Take 1 tablet (75 mg total) by mouth daily. 30 tablet 3   Continuous Blood Gluc Receiver (DEXCOM G7 RECEIVER) DEVI Use to check blood sugars 3 each 3   cyanocobalamin 1000 MCG tablet Take 1 tablet (1,000 mcg total) by mouth daily. 30 tablet 3   empagliflozin (JARDIANCE) 10 MG TABS tablet Take 1 tablet (10 mg total) by mouth daily. 30 tablet 3   hydrALAZINE (APRESOLINE) 50 MG tablet Take 1 tablet (50 mg total) by mouth 3 (three) times daily. 90  tablet 6   isosorbide mononitrate (IMDUR) 30 MG 24 hr tablet Take 1 tablet (30 mg total) by mouth daily. 30 tablet 6   sacubitril-valsartan (ENTRESTO) 24-26 MG Take 1 tablet by mouth 2 (two) times daily. 60 tablet 3   spironolactone (ALDACTONE) 25 MG tablet Take 1 tablet (25 mg total) by mouth daily. 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 30 capsule 11   torsemide (DEMADEX) 20 MG tablet Take 1 tablet (20 mg total) by mouth every Monday, Wednesday, and Friday. 30 tablet 3   No current facility-administered medications for this encounter.   Allergies  Allergen Reactions   Bee Venom Anaphylaxis, Swelling and Other (See Comments)    Swells all over   Social History   Socioeconomic History   Marital status: Single    Spouse name: Not on file   Number of children: Not on file   Years of education: Not on file   Highest education level: Not on file  Occupational History   Not on file  Tobacco Use   Smoking status: Former    Types: Cigarettes    Quit date: 10/06/2017    Years since quitting: 4.5   Smokeless tobacco: Never  Substance and Sexual Activity   Alcohol use: Not Currently   Drug use: Yes    Types: Marijuana   Sexual activity: Not on file  Other Topics Concern   Not on file  Social History Narrative   Not on file   Social Determinants of Health   Financial Resource Strain: Low Risk  (01/18/2022)   Overall Financial Resource Strain (CARDIA)    Difficulty of Paying Living Expenses: Not hard at all  Food Insecurity: No Food Insecurity (01/18/2022)   Hunger Vital Sign    Worried About Running Out of Food in the Last Year: Never true    [place] in the Last Year: Never true  Recent Concern: Food Insecurity - Food Insecurity Present (12/07/2021)   Hunger Vital Sign    Worried About Running Out of Food in the Last Year: Sometimes true    Ran Out of Food in the Last Year: Never true  Transportation Needs: No Transportation Needs (01/18/2022)    PRAPARE - Hydrologist (Medical): No    Lack of Transportation (Non-Medical): No  Recent Concern: Transportation Needs - Unmet Transportation Needs (12/31/2021)   PRAPARE - Transportation    Lack of Transportation (Medical): Yes    Lack of Transportation (Non-Medical): Yes  Physical Activity: Inactive (01/18/2022)   Exercise Vital Sign    Days of Exercise per Week: 0 days    Minutes of Exercise per Session: 0 min  Stress: Stress Concern Present (01/18/2022)   [place]    Feeling of Stress : To some extent  Social  Connections: Socially Isolated (01/18/2022)   Social Connection and Isolation Panel [NHANES]    Frequency of Communication with Friends and Family: More than three times a week    Frequency of Social Gatherings with Friends and Family: More than three times a week    Attends Religious Services: Never    Marine scientist or Organizations: No    Attends Archivist Meetings: Never    Marital Status: Never married  Intimate Partner Violence: Not At Risk (01/18/2022)   Humiliation, Afraid, Rape, and Kick questionnaire    Fear of Current or Ex-Partner: No    Emotionally Abused: No    Physically Abused: No    Sexually Abused: No    History reviewed. No pertinent family history.  BP 100/60   Pulse 75   Wt 71.1 kg (156 lb 12.8 oz)   SpO2 96%   BMI 23.16 kg/m   Wt Readings from Last 3 Encounters:  04/08/22 71.1 kg (156 lb 12.8 oz)  04/01/22 69.4 kg (153 lb)  03/26/22 71.2 kg (157 lb)   PHYSICAL EXAM: General:  Well appearing. No resp difficulty HEENT: normal Neck: supple. no JVD. Carotids 2+ bilat; no bruits. No lymphadenopathy or thryomegaly appreciated. Cor: PMI nondisplaced. Regular rate & rhythm. No rubs, gallops or murmurs. Lungs: clear Abdomen: soft, nontender, nondistended. No hepatosplenomegaly. No bruits or masses. Good bowel sounds. Extremities: no  cyanosis, clubbing, rash, edema Neuro: alert & orientedx3, cranial nerves grossly intact. moves all 4 extremities w/o difficulty. Affect pleasant   ECG (personally reviewed): NSR 82 bpm   REDs: 36%  ASSESSMENT & PLAN: Chronic Systolic HF/Cardiac amyloidosis - Onset not certain. He reports cardiomyopathy diagnosed just prior to MI while living in [place] a couple of years ago. - Echo (10/22): EF 20-25%, RV ok - R/LHC (10/22): Patent RPLV stent with nonobstructive disease elsewhere, Preserved CO/CI, RA mean 5 mmHg, PCWP mean 5 mmHg - cMRI (11/22): LVEF 24% RVEF 21% LGE and ECW suggestive of cardiac amyloidosis. 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 - Multiple myeloma panel (12/22) with no m-spike. 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 Volume looks OK today, - Continue torsemide 20 mg MWF. Can take extra 20 mg PRN weight gain/swelling - Continue hydralazine 50 mg tid and Imdur 30 mg daily.  - Continue Entresto 24/26 mg bid. - Continue Jardiance 10 mg daily. - Continue carvedilol 6.25 mg bid.  - Continue spironolactone 25 mg daily.   - Continue Tafamadis. Consider Patisiran for polyneuropathy symptoms -  Repeat echo at next visit. If EF still < 35% consider ICD - Labs today.  2. wtTTR cardiac amyloidosis - continue tafamadis - consider Patisiran for polyneuropathy  - refer to Dr. Deliah Boston     3. CAD - Prior MI followed by PCI in [place] in either 2019 or 2020.  - Patent RPLV stent on [facility] 10/22. Also had 20% distal RCA, 30% p LAD, 70% d LAD, 60% OM2 - Stent placed 2020. >31yrsince stent placement, could consider stopping Plavix d/t issues with noncompliance. - No s/s angina  - Continue ASA  - Continue atorvastatin 80 mg daily.   4. HTN - Blood pressure well controlled. Continue current regimen.  4. Uncontrolled DM - Needs PCP follow up. - On Januvia. -  A1c down to 7.3 from 12.   5. PVCs - None on ECG today.  6. Neuropathy - On gabapentin. Seems to be doing better. - Unclear if due to DM2 or TTR - Referred to Dr. Posey Pronto in Neurology as above. I called her personally   7. CKD stage IIIb - Baseline SCr 1.7-2.0 - BMET today.   8. Hx fall - CT: L-spine on 12/08/2021 was negative for acute fracture or subluxation of the L-spine. - No further falls.  9. SDOH - He has Medicaid and disability. - Transportation is an issue, he has a car but unable to drive (needs license). - 39 year old son helps with meds. - Paramedicine follows, appreciated their assistance.   Glori Bickers, MD  10:27 AM

## 2022-04-08 NOTE — Research (Signed)
ANALOG  Informed Consent   Subject Name: Thomas West  Subject met inclusion and exclusion criteria.  The informed consent form, study requirements and expectations were reviewed with the subject and questions and concerns were addressed prior to the signing of the consent form.  The subject verbalized understanding of the trial requirements.  The subject agreed to participate in the ANALOG  trial and signed the informed consent at 10:53 on 04-08-2022.  The informed consent was obtained prior to performance of any protocol-specific procedures for the subject.  A copy of the signed informed consent was given to the subject and a copy was placed in the subject's medical record.   [place] Littie Chiem, Research Coordinator  04/08/2022  10:53

## 2022-04-08 NOTE — Progress Notes (Signed)
Paramedicine Encounter   Patient ID: Thomas West , male,   DOB: 1960/10/02,61 y.o.,  MRN: [id number]   Met patient in clinic today with provider.  Time spent with patient 45 minutes  Visit with Mr. Cerone and Dr Haroldine Laws today.  He is doing well with his meds.  Dr. Haroldine Laws is referring him to [name], a neurologist to discuss a new medication to take in addition to his Tifamadis to help with is neuropathy.  Med box reconciled x 1 week. Visit complete  Renee Ramus, [place] 04/08/2022

## 2022-04-13 ENCOUNTER — Encounter: Payer: Self-pay | Admitting: Neurology

## 2022-04-14 ENCOUNTER — Other Ambulatory Visit (HOSPITAL_COMMUNITY): Payer: Self-pay

## 2022-04-14 ENCOUNTER — Other Ambulatory Visit (HOSPITAL_COMMUNITY): Payer: Self-pay | Admitting: Emergency Medicine

## 2022-04-14 NOTE — Progress Notes (Signed)
Paramedicine Encounter    Patient ID: Thomas West, male    DOB: April 28, 1961, 61 y.o.   MRN: [id number]   BP (!) 158/88 (BP Location: Left Arm, Patient Position: Sitting, Cuff Size: Normal)   Pulse 86   Resp 16   Wt 161 lb 9.6 oz (73.3 kg)   SpO2 97%   BMI 23.86 kg/m  Weight yesterday-not taken Last visit weight-156lb  [name] A&O x 4, skin W&D w/ good color.  Pt. Denies chest pain or SOB.  Lung sounds clear and equal bilat and no edema noted.  He is missing that noon dose of Hydralazine.  Med box reconciled x 1 week.  Refill called in for Vit. B12 and Atorvastatin.  Home visit complete.    Beatrix Shipper, EMT-Paramedic [phone] 04/14/2022     Patient Care Team: [name], [name], MD as PCP - General (Family Medicine) Thomas Stade, MD as PCP - Cardiology (Cardiology)  Patient Active Problem List   Diagnosis Date Noted   Polyneuropathy associated with underlying disease (HCC) 01/15/2022   Atherosclerosis of aorta (HCC) 01/15/2022   History of CVA (cerebrovascular accident) without residual deficits 12/24/2021   AKI (acute kidney injury) (HCC) 12/13/2021   Diarrhea 12/13/2021   Shortness of breath 12/13/2021   Chest pain 12/13/2021   Nausea and vomiting 12/13/2021   Heart failure (HCC) 12/08/2021   Hypertension associated with diabetes (HCC) 07/12/2021   Chronic kidney disease, stage 3a (HCC) 07/12/2021   Hyperkalemia 07/12/2021   CHF exacerbation (HCC) 04/20/2021   Acute on chronic combined systolic and diastolic CHF (congestive heart failure) (HCC) 03/09/2021   Acute on chronic HFrEF (heart failure with reduced ejection fraction) (HCC) 03/08/2021   HFrEF (heart failure with reduced ejection fraction) (HCC) 02/03/2021   Diabetes mellitus (HCC) 03/27/2019   Hyperlipidemia associated with type 2 diabetes mellitus (HCC) 03/27/2019   CAD (coronary artery disease) 03/05/2019   History of MI (myocardial infarction) 03/05/2019   Type 2 diabetes mellitus with diabetic  neuropathy, unspecified (HCC) 03/05/2019   HLD (hyperlipidemia) 03/05/2019   GERD (gastroesophageal reflux disease) 03/05/2019   Hypertension, essential, benign 03/05/2019    Current Outpatient Medications:    aspirin EC 81 MG tablet, Take 1 tablet (81 mg total) by mouth daily. Swallow whole., Disp: 30 tablet, Rfl: 12   atorvastatin (LIPITOR) 80 MG tablet, Take 1 tablet (80 mg total) by mouth daily., Disp: 30 tablet, Rfl: 6   carvedilol (COREG) 6.25 MG tablet, Take 1 tablet (6.25 mg total) by mouth EVERY 12 HOURS (10AM &10PM), Disp: 60 tablet, Rfl: 6   clopidogrel (PLAVIX) 75 MG tablet, Take 1 tablet (75 mg total) by mouth daily., Disp: 30 tablet, Rfl: 3   Continuous Blood Gluc Receiver (DEXCOM G7 RECEIVER) DEVI, Use to check blood sugars, Disp: 3 each, Rfl: 3   cyanocobalamin 1000 MCG tablet, Take 1 tablet (1,000 mcg total) by mouth daily., Disp: 30 tablet, Rfl: 3   empagliflozin (JARDIANCE) 10 MG TABS tablet, Take 1 tablet (10 mg total) by mouth daily., Disp: 30 tablet, Rfl: 3   hydrALAZINE (APRESOLINE) 50 MG tablet, Take 1 tablet (50 mg total) by mouth 3 (three) times daily., Disp: 90 tablet, Rfl: 6   isosorbide mononitrate (IMDUR) 30 MG 24 hr tablet, Take 1 tablet (30 mg total) by mouth daily., Disp: 30 tablet, Rfl: 6   sacubitril-valsartan (ENTRESTO) 24-26 MG, Take 1 tablet by mouth 2 (two) times daily., Disp: 60 tablet, Rfl: 3   spironolactone (ALDACTONE) 25 MG tablet, Take 1  tablet (25 mg total) by mouth daily., Disp: 30 tablet, Rfl: 11   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, Take 1 capsule by mouth daily., Disp: 30 capsule, Rfl: 11   torsemide (DEMADEX) 20 MG tablet, Take 1 tablet (20 mg total) by mouth every Monday, Wednesday, and Friday., Disp: 30 tablet, Rfl: 3 Allergies  Allergen Reactions   Bee Venom Anaphylaxis, Swelling and Other (See Comments)    Swells all over      Social History   Socioeconomic History   Marital status: Single    Spouse name: Not on file   Number of children: Not  on file   Years of education: Not on file   Highest education level: Not on file  Occupational History   Not on file  Tobacco Use   Smoking status: Former    Types: Cigarettes    Quit date: 10/06/2017    Years since quitting: 4.5   Smokeless tobacco: Never  Substance and Sexual Activity   Alcohol use: Not Currently   Drug use: Yes    Types: Marijuana   Sexual activity: Not on file  Other Topics Concern   Not on file  Social History Narrative   Not on file   Social Determinants of Health   Financial Resource Strain: Low Risk  (01/18/2022)   Overall Financial Resource Strain (CARDIA)    Difficulty of Paying Living Expenses: Not hard at all  Food Insecurity: No Food Insecurity (01/18/2022)   Hunger Vital Sign    Worried About Running Out of Food in the Last Year: Never true    Ran Out of Food in the Last Year: Never true  Recent Concern: Food Insecurity - Food Insecurity Present (12/07/2021)   Hunger Vital Sign    Worried About Running Out of Food in the Last Year: Sometimes true    Ran Out of Food in the Last Year: Never true  Transportation Needs: No Transportation Needs (01/18/2022)   PRAPARE - Administrator, Civil Service (Medical): No    Lack of Transportation (Non-Medical): No  Recent Concern: Transportation Needs - Unmet Transportation Needs (12/31/2021)   PRAPARE - Transportation    Lack of Transportation (Medical): Yes    Lack of Transportation (Non-Medical): Yes  Physical Activity: Inactive (01/18/2022)   Exercise Vital Sign    Days of Exercise per Week: 0 days    Minutes of Exercise per Session: 0 min  Stress: Stress Concern Present (01/18/2022)   [employer] of Occupational Health - Occupational Stress Questionnaire    Feeling of Stress : To some extent  Social Connections: Socially Isolated (01/18/2022)   Social Connection and Isolation Panel [NHANES]    Frequency of Communication with Friends and Family: More than three times a week    Frequency  of Social Gatherings with Friends and Family: More than three times a week    Attends Religious Services: Never    Database administrator or Organizations: No    Attends Banker Meetings: Never    Marital Status: Never married  Intimate Partner Violence: Not At Risk (01/18/2022)   Humiliation, Afraid, Rape, and Kick questionnaire    Fear of Current or Ex-Partner: No    Emotionally Abused: No    Physically Abused: No    Sexually Abused: No    Physical Exam      Future Appointments  Date Time Provider Department Center  06/14/2022 12:50 PM Patel, Donika K, DO [facility] None       Skyllar Notarianni  Katrinka Blazing, EMT-P-Paramedic [phone] [facility] Paramedic  04/14/22

## 2022-04-16 ENCOUNTER — Other Ambulatory Visit (HOSPITAL_COMMUNITY): Payer: Self-pay

## 2022-04-21 ENCOUNTER — Other Ambulatory Visit (HOSPITAL_COMMUNITY): Payer: Self-pay

## 2022-04-21 ENCOUNTER — Telehealth (HOSPITAL_COMMUNITY): Payer: Self-pay

## 2022-04-21 ENCOUNTER — Telehealth (HOSPITAL_COMMUNITY): Payer: Self-pay | Admitting: Emergency Medicine

## 2022-04-21 ENCOUNTER — Other Ambulatory Visit (HOSPITAL_COMMUNITY): Payer: Self-pay | Admitting: Emergency Medicine

## 2022-04-21 NOTE — Telephone Encounter (Signed)
Created in error

## 2022-04-21 NOTE — Progress Notes (Signed)
Paramedicine Encounter    Patient ID: Thomas West, male    DOB: 10-Jan-1961, 61 y.o.   MRN: [id number]   BP (!) 140/90 (BP Location: Left Arm, Patient Position: Sitting, Cuff Size: Normal)   Pulse 90   Resp 16   Wt 158 lb 12.8 oz (72 kg)   SpO2 100%   BMI 23.45 kg/m  Weight yesterday-not taken Last visit weight-161.9lb  ATF [name] A&O x 4, skin W&D w/ good color.  Pt denies chest pain or SOB.  Lung sounds clear and equal bilat.  No edema noted.  Compliant with all meds except noon dose of Hydralazine x 1 week.  Med box reconciled.  Refills called in for Atorvastatin and Spironolactone.  Home visit complete.    Beatrix Shipper, EMT-Paramedic [phone] 04/21/2022   Patient Care Team: Wendall Stade, MD as PCP - Cardiology (Cardiology)  Patient Active Problem List   Diagnosis Date Noted   Polyneuropathy associated with underlying disease (HCC) 01/15/2022   Atherosclerosis of aorta (HCC) 01/15/2022   History of CVA (cerebrovascular accident) without residual deficits 12/24/2021   AKI (acute kidney injury) (HCC) 12/13/2021   Diarrhea 12/13/2021   Shortness of breath 12/13/2021   Chest pain 12/13/2021   Nausea and vomiting 12/13/2021   Heart failure (HCC) 12/08/2021   Hypertension associated with diabetes (HCC) 07/12/2021   Chronic kidney disease, stage 3a (HCC) 07/12/2021   Hyperkalemia 07/12/2021   CHF exacerbation (HCC) 04/20/2021   Acute on chronic combined systolic and diastolic CHF (congestive heart failure) (HCC) 03/09/2021   Acute on chronic HFrEF (heart failure with reduced ejection fraction) (HCC) 03/08/2021   HFrEF (heart failure with reduced ejection fraction) (HCC) 02/03/2021   Diabetes mellitus (HCC) 03/27/2019   Hyperlipidemia associated with type 2 diabetes mellitus (HCC) 03/27/2019   CAD (coronary artery disease) 03/05/2019   History of MI (myocardial infarction) 03/05/2019   Type 2 diabetes mellitus with diabetic neuropathy, unspecified (HCC) 03/05/2019    HLD (hyperlipidemia) 03/05/2019   GERD (gastroesophageal reflux disease) 03/05/2019   Hypertension, essential, benign 03/05/2019    Current Outpatient Medications:    aspirin EC 81 MG tablet, Take 1 tablet (81 mg total) by mouth daily. Swallow whole., Disp: 30 tablet, Rfl: 12   atorvastatin (LIPITOR) 80 MG tablet, Take 1 tablet (80 mg total) by mouth daily., Disp: 30 tablet, Rfl: 6   carvedilol (COREG) 6.25 MG tablet, Take 1 tablet (6.25 mg total) by mouth EVERY 12 HOURS (10AM &10PM), Disp: 60 tablet, Rfl: 6   clopidogrel (PLAVIX) 75 MG tablet, Take 1 tablet (75 mg total) by mouth daily., Disp: 30 tablet, Rfl: 3   Continuous Blood Gluc Receiver (DEXCOM G7 RECEIVER) DEVI, Use to check blood sugars, Disp: 3 each, Rfl: 3   cyanocobalamin 1000 MCG tablet, Take 1 tablet (1,000 mcg total) by mouth daily., Disp: 30 tablet, Rfl: 3   empagliflozin (JARDIANCE) 10 MG TABS tablet, Take 1 tablet (10 mg total) by mouth daily., Disp: 30 tablet, Rfl: 3   hydrALAZINE (APRESOLINE) 50 MG tablet, Take 1 tablet (50 mg total) by mouth 3 (three) times daily., Disp: 90 tablet, Rfl: 6   isosorbide mononitrate (IMDUR) 30 MG 24 hr tablet, Take 1 tablet (30 mg total) by mouth daily., Disp: 30 tablet, Rfl: 6   sacubitril-valsartan (ENTRESTO) 24-26 MG, Take 1 tablet by mouth 2 (two) times daily., Disp: 60 tablet, Rfl: 3   spironolactone (ALDACTONE) 25 MG tablet, Take 1 tablet (25 mg total) by mouth daily., Disp: 30 tablet, Rfl: 11  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, Take 1 capsule by mouth daily., Disp: 30 capsule, Rfl: 11   torsemide (DEMADEX) 20 MG tablet, Take 1 tablet (20 mg total) by mouth every Monday, Wednesday, and Friday., Disp: 30 tablet, Rfl: 3 Allergies  Allergen Reactions   Bee Venom Anaphylaxis, Swelling and Other (See Comments)    Swells all over      Social History   Socioeconomic History   Marital status: Single    Spouse name: Not on file   Number of children: Not on file   Years of education: Not on  file   Highest education level: Not on file  Occupational History   Not on file  Tobacco Use   Smoking status: Former    Types: Cigarettes    Quit date: 10/06/2017    Years since quitting: 4.5   Smokeless tobacco: Never  Substance and Sexual Activity   Alcohol use: Not Currently   Drug use: Yes    Types: Marijuana   Sexual activity: Not on file  Other Topics Concern   Not on file  Social History Narrative   Not on file   Social Determinants of Health   Financial Resource Strain: Low Risk  (01/18/2022)   Overall Financial Resource Strain (CARDIA)    Difficulty of Paying Living Expenses: Not hard at all  Food Insecurity: No Food Insecurity (01/18/2022)   Hunger Vital Sign    Worried About Running Out of Food in the Last Year: Never true    Ran Out of Food in the Last Year: Never true  Recent Concern: Food Insecurity - Food Insecurity Present (12/07/2021)   Hunger Vital Sign    Worried About Running Out of Food in the Last Year: Sometimes true    Ran Out of Food in the Last Year: Never true  Transportation Needs: No Transportation Needs (01/18/2022)   PRAPARE - Administrator, Civil Service (Medical): No    Lack of Transportation (Non-Medical): No  Recent Concern: Transportation Needs - Unmet Transportation Needs (12/31/2021)   PRAPARE - Transportation    Lack of Transportation (Medical): Yes    Lack of Transportation (Non-Medical): Yes  Physical Activity: Inactive (01/18/2022)   Exercise Vital Sign    Days of Exercise per Week: 0 days    Minutes of Exercise per Session: 0 min  Stress: Stress Concern Present (01/18/2022)   [employer] of Occupational Health - Occupational Stress Questionnaire    Feeling of Stress : To some extent  Social Connections: Socially Isolated (01/18/2022)   Social Connection and Isolation Panel [NHANES]    Frequency of Communication with Friends and Family: More than three times a week    Frequency of Social Gatherings with Friends and  Family: More than three times a week    Attends Religious Services: Never    Database administrator or Organizations: No    Attends Banker Meetings: Never    Marital Status: Never married  Intimate Partner Violence: Not At Risk (01/18/2022)   Humiliation, Afraid, Rape, and Kick questionnaire    Fear of Current or Ex-Partner: No    Emotionally Abused: No    Physically Abused: No    Sexually Abused: No    Physical Exam      Future Appointments  Date Time Provider Department Center  06/14/2022 12:50 PM Glendale Chard, DO [facility] None       Beatrix Shipper, EMT-P-Paramedic [phone] [facility] Paramedic  04/21/22

## 2022-04-21 NOTE — Telephone Encounter (Signed)
[name] advised

## 2022-04-21 NOTE — Telephone Encounter (Signed)
Thomas West, paramedic called to report that on 11/22 patients blood pressure was 158/88 and today it was 140/90. It was then discovered that patient had a high sodium dinner last night. Advised patient to avoid high sodium foods. Patient denies any headaches or dizziness. Thomas West also states that he did take his medication today. Please advise if anything else needs to be done.

## 2022-04-21 NOTE — Telephone Encounter (Signed)
Received phone call from Philicia Branch stating that if [name] blood pressures remains >150 systolic to contact Dr. Gala Romney.    Beatrix Shipper, EMT-Paramedic [phone] 04/21/2022

## 2022-04-26 ENCOUNTER — Other Ambulatory Visit: Payer: Self-pay | Admitting: Family Medicine

## 2022-04-27 ENCOUNTER — Other Ambulatory Visit (HOSPITAL_COMMUNITY): Payer: Self-pay

## 2022-04-28 ENCOUNTER — Other Ambulatory Visit (HOSPITAL_COMMUNITY): Payer: Self-pay

## 2022-04-28 ENCOUNTER — Emergency Department (HOSPITAL_COMMUNITY)
Admission: EM | Admit: 2022-04-28 | Discharge: 2022-04-28 | Disposition: A | Discharge status: Home / Self Care | Payer: Medicare Other | Attending: Emergency Medicine | Admitting: Emergency Medicine

## 2022-04-28 ENCOUNTER — Other Ambulatory Visit (HOSPITAL_COMMUNITY): Payer: Self-pay | Admitting: Emergency Medicine

## 2022-04-28 ENCOUNTER — Emergency Department (HOSPITAL_COMMUNITY): Payer: Medicare Other

## 2022-04-28 ENCOUNTER — Other Ambulatory Visit: Payer: Self-pay

## 2022-04-28 DIAGNOSIS — Z79899 Other long term (current) drug therapy: Secondary | ICD-10-CM | POA: Diagnosis not present

## 2022-04-28 DIAGNOSIS — R0602 Shortness of breath: Secondary | ICD-10-CM | POA: Diagnosis not present

## 2022-04-28 DIAGNOSIS — I509 Heart failure, unspecified: Secondary | ICD-10-CM | POA: Diagnosis not present

## 2022-04-28 DIAGNOSIS — Z7982 Long term (current) use of aspirin: Secondary | ICD-10-CM | POA: Diagnosis not present

## 2022-04-28 DIAGNOSIS — L299 Pruritus, unspecified: Secondary | ICD-10-CM | POA: Diagnosis not present

## 2022-04-28 DIAGNOSIS — I2510 Atherosclerotic heart disease of native coronary artery without angina pectoris: Secondary | ICD-10-CM | POA: Insufficient documentation

## 2022-04-28 DIAGNOSIS — R0789 Other chest pain: Secondary | ICD-10-CM | POA: Diagnosis not present

## 2022-04-28 DIAGNOSIS — R079 Chest pain, unspecified: Secondary | ICD-10-CM | POA: Diagnosis not present

## 2022-04-28 DIAGNOSIS — Z7984 Long term (current) use of oral hypoglycemic drugs: Secondary | ICD-10-CM | POA: Insufficient documentation

## 2022-04-28 DIAGNOSIS — E119 Type 2 diabetes mellitus without complications: Secondary | ICD-10-CM | POA: Insufficient documentation

## 2022-04-28 DIAGNOSIS — T68XXXA Hypothermia, initial encounter: Secondary | ICD-10-CM | POA: Diagnosis not present

## 2022-04-28 DIAGNOSIS — I110 Hypertensive heart disease with heart failure: Secondary | ICD-10-CM | POA: Diagnosis not present

## 2022-04-28 DIAGNOSIS — R0689 Other abnormalities of breathing: Secondary | ICD-10-CM | POA: Diagnosis not present

## 2022-04-28 LAB — BASIC METABOLIC PANEL
Anion gap: 8 (ref 5–15)
BUN: 24 mg/dL — ABNORMAL HIGH (ref 6–20)
CO2: 24 mmol/L (ref 22–32)
Calcium: 8.4 mg/dL — ABNORMAL LOW (ref 8.9–10.3)
Chloride: 104 mmol/L (ref 98–111)
Creatinine, Ser: 1.67 mg/dL — ABNORMAL HIGH (ref 0.61–1.24)
GFR, Estimated: 47 mL/min — ABNORMAL LOW (ref >60)
Glucose, Bld: 125 mg/dL — ABNORMAL HIGH (ref 70–99)
Potassium: 4.4 mmol/L (ref 3.5–5.1)
Sodium: 136 mmol/L (ref 135–145)

## 2022-04-28 LAB — CBG MONITORING, ED: Glucose-Capillary: 111 mg/dL — ABNORMAL HIGH (ref 70–99)

## 2022-04-28 LAB — CBC
HCT: 40.7 % (ref 39.0–52.0)
Hemoglobin: 13.8 g/dL (ref 13.0–17.0)
MCH: 28.8 pg (ref 26.0–34.0)
MCHC: 33.9 g/dL (ref 30.0–36.0)
MCV: 85 fL (ref 80.0–100.0)
Platelets: 351 10*3/uL (ref 150–400)
RBC: 4.79 MIL/uL (ref 4.22–5.81)
RDW: 16.7 % — ABNORMAL HIGH (ref 11.5–15.5)
WBC: 8.1 10*3/uL (ref 4.0–10.5)
nRBC: 0 % (ref 0.0–0.2)

## 2022-04-28 LAB — TROPONIN I (HIGH SENSITIVITY)
Troponin I (High Sensitivity): 13 ng/L (ref <18)
Troponin I (High Sensitivity): 13 ng/L (ref <18)

## 2022-04-28 LAB — BRAIN NATRIURETIC PEPTIDE: B Natriuretic Peptide: 82.6 pg/mL (ref 0.0–100.0)

## 2022-04-28 MED ORDER — NITROGLYCERIN 0.4 MG SL SUBL
0.4000 mg | SUBLINGUAL_TABLET | SUBLINGUAL | Status: DC | PRN
  Administered 2022-04-28 (×3): 0.4 mg via SUBLINGUAL
  Filled 2022-04-28: qty 1

## 2022-04-28 MED ORDER — ASPIRIN 81 MG PO CHEW
324.0000 mg | CHEWABLE_TABLET | Freq: Once | ORAL | Status: AC
  Administered 2022-04-28: 324 mg via ORAL
  Filled 2022-04-28: qty 4

## 2022-04-28 NOTE — ED Provider Notes (Signed)
On signout the patient was awaiting additional labs.  Now, remaining troponin is normal.  He is awake, alert, sitting upright, eating, comfortable with discharge.   Gerhard Munch, MD 04/28/22 [phone]

## 2022-04-28 NOTE — ED Triage Notes (Addendum)
Pt BIB [facility] from home d/t CP and SOB. Hx MI CHF. CP started 7am this morning, more like tightness across the whole chest. No n/v. 94% ON RA. A&OX4. EKG normal w EMS. 125 solumedrol given by EMS. 10 albuterol. 1mg  atrovent.

## 2022-04-28 NOTE — ED Notes (Signed)
EDP at [facility] 

## 2022-04-28 NOTE — Progress Notes (Signed)
Paramedicine Encounter    Patient ID: Thomas West, male    DOB: Dec 19, 1960, 61 y.o.   MRN: [id number]   BP 110/80 (BP Location: Left Arm, Patient Position: Sitting, Cuff Size: Normal)   Pulse 81   Resp 16   Wt 159 lb 12.8 oz (72.5 kg)   SpO2 94%   BMI 23.60 kg/m  Weight yesterday-not taken Last visit weight-158lb  ATF for scheduled visit for Thomas West.  Found him to be A&O x 4, skin W&D w/ good color.  Med reconciled x 1 week.  During obtaining vitals and asking assessment questions he advised that he was having "chest tightness" midsternal  w/o radiation.  No nausea, vomiting or SOB.  He states the "tightness" woke him from sleep around 7:00 am. He rates the discomfort 9 out of 10.  Placed pt on monitor.  NSR.  12-lead showed inverted T waves in lateral leads.  Pt states that he would like to go to the hospital for further evaluation.  Ambulance arrived and pt was packaged with further chest pain protocol continued by medics.  Visit complete.    Beatrix Shipper, EMT-Paramedic [phone] 04/28/2022  Patient Care Team: [name], [name], MD as PCP - General (Family Medicine) Wendall Stade, MD as PCP - Cardiology (Cardiology)  Patient Active Problem List   Diagnosis Date Noted   Polyneuropathy associated with underlying disease (HCC) 01/15/2022   Atherosclerosis of aorta (HCC) 01/15/2022   History of CVA (cerebrovascular accident) without residual deficits 12/24/2021   AKI (acute kidney injury) (HCC) 12/13/2021   Diarrhea 12/13/2021   Shortness of breath 12/13/2021   Chest pain 12/13/2021   Nausea and vomiting 12/13/2021   Heart failure (HCC) 12/08/2021   Hypertension associated with diabetes (HCC) 07/12/2021   Chronic kidney disease, stage 3a (HCC) 07/12/2021   Hyperkalemia 07/12/2021   CHF exacerbation (HCC) 04/20/2021   Acute on chronic combined systolic and diastolic CHF (congestive heart failure) (HCC) 03/09/2021   Acute on chronic HFrEF (heart failure with reduced ejection  fraction) (HCC) 03/08/2021   HFrEF (heart failure with reduced ejection fraction) (HCC) 02/03/2021   Diabetes mellitus (HCC) 03/27/2019   Hyperlipidemia associated with type 2 diabetes mellitus (HCC) 03/27/2019   CAD (coronary artery disease) 03/05/2019   History of MI (myocardial infarction) 03/05/2019   Type 2 diabetes mellitus with diabetic neuropathy, unspecified (HCC) 03/05/2019   HLD (hyperlipidemia) 03/05/2019   GERD (gastroesophageal reflux disease) 03/05/2019   Hypertension, essential, benign 03/05/2019    Current Outpatient Medications:    aspirin EC 81 MG tablet, Take 1 tablet (81 mg total) by mouth daily. Swallow whole., Disp: 30 tablet, Rfl: 12   atorvastatin (LIPITOR) 80 MG tablet, Take 1 tablet (80 mg total) by mouth daily., Disp: 30 tablet, Rfl: 6   carvedilol (COREG) 6.25 MG tablet, Take 1 tablet (6.25 mg total) by mouth EVERY 12 HOURS (10AM &10PM), Disp: 60 tablet, Rfl: 6   clopidogrel (PLAVIX) 75 MG tablet, Take 1 tablet (75 mg total) by mouth daily., Disp: 30 tablet, Rfl: 3   Continuous Blood Gluc Receiver (DEXCOM G7 RECEIVER) DEVI, Use to check blood sugars, Disp: 3 each, Rfl: 3   cyanocobalamin 1000 MCG tablet, Take 1 tablet (1,000 mcg total) by mouth daily., Disp: 30 tablet, Rfl: 3   empagliflozin (JARDIANCE) 10 MG TABS tablet, Take 1 tablet (10 mg total) by mouth daily., Disp: 30 tablet, Rfl: 3   hydrALAZINE (APRESOLINE) 50 MG tablet, Take 1 tablet (50 mg total) by mouth 3 (three) times  daily., Disp: 90 tablet, Rfl: 6   isosorbide mononitrate (IMDUR) 30 MG 24 hr tablet, Take 1 tablet (30 mg total) by mouth daily., Disp: 30 tablet, Rfl: 6   sacubitril-valsartan (ENTRESTO) 24-26 MG, Take 1 tablet by mouth 2 (two) times daily., Disp: 60 tablet, Rfl: 3   spironolactone (ALDACTONE) 25 MG tablet, Take 1 tablet (25 mg total) by mouth daily., Disp: 30 tablet, Rfl: 11   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, Take 1 capsule by mouth daily., Disp: 30 capsule, Rfl: 11   torsemide (DEMADEX) 20 MG  tablet, Take 1 tablet (20 mg total) by mouth every Monday, Wednesday, and Friday., Disp: 30 tablet, Rfl: 3 No current facility-administered medications for this visit.  Facility-Administered Medications Ordered in Other Visits:    nitroGLYCERIN (NITROSTAT) SL tablet 0.4 mg, 0.4 mg, Sublingual, Q5 Min x 3 PRN, Linwood Dibbles, MD, 0.4 mg at 04/28/22 1253 Allergies  Allergen Reactions   Bee Venom Anaphylaxis, Swelling and Other (See Comments)    Swells all over      Social History   Socioeconomic History   Marital status: Single    Spouse name: Not on file   Number of children: Not on file   Years of education: Not on file   Highest education level: Not on file  Occupational History   Not on file  Tobacco Use   Smoking status: Former    Types: Cigarettes    Quit date: 10/06/2017    Years since quitting: 4.5   Smokeless tobacco: Never  Substance and Sexual Activity   Alcohol use: Not Currently   Drug use: Yes    Types: Marijuana   Sexual activity: Not on file  Other Topics Concern   Not on file  Social History Narrative   Not on file   Social Determinants of Health   Financial Resource Strain: Low Risk  (01/18/2022)   Overall Financial Resource Strain (CARDIA)    Difficulty of Paying Living Expenses: Not hard at all  Food Insecurity: No Food Insecurity (01/18/2022)   Hunger Vital Sign    Worried About Running Out of Food in the Last Year: Never true    Ran Out of Food in the Last Year: Never true  Recent Concern: Food Insecurity - Food Insecurity Present (12/07/2021)   Hunger Vital Sign    Worried About Running Out of Food in the Last Year: Sometimes true    Ran Out of Food in the Last Year: Never true  Transportation Needs: No Transportation Needs (01/18/2022)   PRAPARE - Administrator, Civil Service (Medical): No    Lack of Transportation (Non-Medical): No  Recent Concern: Transportation Needs - Unmet Transportation Needs (12/31/2021)   PRAPARE -  Transportation    Lack of Transportation (Medical): Yes    Lack of Transportation (Non-Medical): Yes  Physical Activity: Inactive (01/18/2022)   Exercise Vital Sign    Days of Exercise per Week: 0 days    Minutes of Exercise per Session: 0 min  Stress: Stress Concern Present (01/18/2022)   [employer] of Occupational Health - Occupational Stress Questionnaire    Feeling of Stress : To some extent  Social Connections: Socially Isolated (01/18/2022)   Social Connection and Isolation Panel [NHANES]    Frequency of Communication with Friends and Family: More than three times a week    Frequency of Social Gatherings with Friends and Family: More than three times a week    Attends Religious Services: Never    Active Member of  Clubs or Organizations: No    Attends Banker Meetings: Never    Marital Status: Never married  Intimate Partner Violence: Not At Risk (01/18/2022)   Humiliation, Afraid, Rape, and Kick questionnaire    Fear of Current or Ex-Partner: No    Emotionally Abused: No    Physically Abused: No    Sexually Abused: No    Physical Exam      Future Appointments  Date Time Provider Department Center  06/14/2022 12:50 PM Glendale Chard, DO [facility] None       Beatrix Shipper, EMT-P-Paramedic [phone] [facility] Paramedic  04/28/22

## 2022-04-28 NOTE — ED Notes (Signed)
EDP into see, pt sleeping, arousable, no changes.

## 2022-04-28 NOTE — ED Provider Notes (Signed)
[facility] EMERGENCY DEPARTMENT Provider Note   CSN: [id number] Arrival date & time: 04/28/22  1150     History  Chief Complaint  Patient presents with   Chest Pain   Shortness of Breath    Thomas West is a 61 y.o. male.   Chest Pain Associated symptoms: shortness of breath   Shortness of Breath Associated symptoms: chest pain    Patient has a history of diabetes hypertension, coronary artery disease, CHF, stroke.  He presents to the ED with complaints of chest tightness and shortness of breath.  Patient states his symptoms started around 7 AM this morning.  He has a tightness going from the center of his chest to the sides.  He does feel somewhat short of breath.  No nausea vomiting no diaphoresis.  Patient does have history of coronary artery disease but states this does not feel like when he had an MI.  He has had some cough and congestion recently.  He is also had some loose stools.  He also mentions having a rash for a while that has been itching him.    Home Medications Prior to Admission medications   Medication Sig Start Date End Date Taking? Authorizing Provider  aspirin EC 81 MG tablet Take 1 tablet (81 mg total) by mouth daily. Swallow whole. 12/11/21   Alen Bleacher, NP  atorvastatin (LIPITOR) 80 MG tablet Take 1 tablet (80 mg total) by mouth daily. 12/10/21 07/08/22  Alen Bleacher, NP  carvedilol (COREG) 6.25 MG tablet Take 1 tablet (6.25 mg total) by mouth EVERY 12 HOURS (10AM &10PM) 12/10/21 07/08/22  Alen Bleacher, NP  clopidogrel (PLAVIX) 75 MG tablet Take 1 tablet (75 mg total) by mouth daily. 01/20/22   Jacklynn Ganong, FNP  Continuous Blood Gluc Receiver (DEXCOM G7 RECEIVER) DEVI Use to check blood sugars 01/19/22   [name], [name], MD  cyanocobalamin 1000 MCG tablet Take 1 tablet (1,000 mcg total) by mouth daily. 02/22/22   [name], [name], MD  empagliflozin (JARDIANCE) 10 MG TABS tablet Take 1 tablet (10 mg total) by mouth daily. 01/28/22    [place], Anderson Malta, FNP  hydrALAZINE (APRESOLINE) 50 MG tablet Take 1 tablet (50 mg total) by mouth 3 (three) times daily. 12/10/21 07/08/22  Alen Bleacher, NP  isosorbide mononitrate (IMDUR) 30 MG 24 hr tablet Take 1 tablet (30 mg total) by mouth daily. 12/10/21   Alen Bleacher, NP  sacubitril-valsartan (ENTRESTO) 24-26 MG Take 1 tablet by mouth 2 (two) times daily. 01/28/22   Jacklynn Ganong, FNP  spironolactone (ALDACTONE) 25 MG tablet Take 1 tablet (25 mg total) by mouth daily. 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 09/02/21   Bensimhon, Bevelyn Buckles, MD  torsemide (DEMADEX) 20 MG tablet Take 1 tablet (20 mg total) by mouth every Monday, Wednesday, and Friday. 01/29/22   Jacklynn Ganong, FNP      Allergies    Bee venom    Review of Systems   Review of Systems  Respiratory:  Positive for shortness of breath.   Cardiovascular:  Positive for chest pain.    Physical Exam Updated Vital Signs BP (!) 144/105   Pulse 81   Temp 98.4 F (36.9 C) (Oral)   Resp 19   Ht 1.753 m (5\' 9" )   Wt 76.2 kg   SpO2 98%   BMI 24.81 kg/m  Physical Exam Vitals and nursing  note reviewed.  Constitutional:      General: He is not in acute distress.    Appearance: He is well-developed.  HENT:     Head: Normocephalic and atraumatic.     Right Ear: External ear normal.     Left Ear: External ear normal.  Eyes:     General: No scleral icterus.       Right eye: No discharge.        Left eye: No discharge.     Conjunctiva/sclera: Conjunctivae normal.  Neck:     Trachea: No tracheal deviation.  Cardiovascular:     Rate and Rhythm: Normal rate and regular rhythm.  Pulmonary:     Effort: Pulmonary effort is normal. No respiratory distress.     Breath sounds: Normal breath sounds. No stridor. No wheezing or rales.  Abdominal:     General: Bowel sounds are normal. There is no distension.     Palpations: Abdomen is soft.     Tenderness: There is no  abdominal tenderness. There is no guarding or rebound.  Musculoskeletal:        General: No tenderness or deformity.     Cervical back: Neck supple.  Skin:    General: Skin is warm and dry.     Comments: Few nondescript macular papular areas of hyperpigmentation, no pustules, no petechiae  Neurological:     General: No focal deficit present.     Mental Status: He is alert.     Cranial Nerves: No cranial nerve deficit (no facial droop, extraocular movements intact, no slurred speech).     Sensory: No sensory deficit.     Motor: No abnormal muscle tone or seizure activity.     Coordination: Coordination normal.  Psychiatric:        Mood and Affect: Mood normal.     ED Results / Procedures / Treatments   Labs (all labs ordered are listed, but only abnormal results are displayed) Labs Reviewed  BASIC METABOLIC PANEL - Abnormal; Notable for the following components:      Result Value   Glucose, Bld 125 (*)    BUN 24 (*)    Creatinine, Ser 1.67 (*)    Calcium 8.4 (*)    GFR, Estimated 47 (*)    All other components within normal limits  CBC - Abnormal; Notable for the following components:   RDW 16.7 (*)    All other components within normal limits  CBG MONITORING, ED - Abnormal; Notable for the following components:   Glucose-Capillary 111 (*)    All other components within normal limits  BRAIN NATRIURETIC PEPTIDE  TROPONIN I (HIGH SENSITIVITY)  TROPONIN I (HIGH SENSITIVITY)    EKG EKG Interpretation  Date/Time:  Wednesday April 28 2022 12:21:17 EST Ventricular Rate:  76 PR Interval:  197 QRS Duration: 85 QT Interval:  397 QTC Calculation: 447 R Axis:   14 Text Interpretation: Sinus rhythm Abnormal T, consider ischemia, lateral leads Minimal ST elevation, anterior leads No significant change since last tracing Confirmed by Linwood Dibbles [phone]) on 04/28/2022 1:52:14 PM  Radiology No results found.  Procedures Procedures    Medications Ordered in ED Medications   aspirin chewable tablet 324 mg (324 mg Oral Given 04/28/22 1224)    ED Course/ Medical Decision Making/ A&P Clinical Course as of 04/30/22 1503  Wed Apr 28, 2022  1351 CBC(!) cBC normal [JK]  1351 DG Chest 2 View X-ray without acute findings [JK]  1355 Brain natriuretic peptide Normal [JK]  Clinical Course User Index [JK] Linwood Dibbles, MD                           Medical Decision Making Differential diagnosis includes but not limited to pneumonia pneumothorax pulmonary embolism ACS, atypical chest pain  Problems Addressed: Atypical chest pain: acute illness or injury  Amount and/or Complexity of Data Reviewed Labs: ordered. Decision-making details documented in ED Course. Radiology: ordered and independent interpretation performed. Decision-making details documented in ED Course.  Risk OTC drugs. Prescription drug management.   Patient presented to ED for evaluation of chest pain.  Known history of coronary disease.  Moderate risk heart score however her symptoms not typical for his known ACS.  Patient also is having some respiratory complaints.  ED workup reassuring.  Initial chest x-ray CBC normal.  Troponin also normal.  Delta troponin was pending at shift change.  Dr. Jeraldine Loots follow-up on the second which was normal.  Patient appears stable for outpatient follow-up with his cardiologist.  Warning signs precautions discussed.        Final Clinical Impression(s) / ED Diagnoses Final diagnoses:  Atypical chest pain    Rx / DC Orders ED Discharge Orders     None         Linwood Dibbles, MD 04/30/22 1503

## 2022-04-28 NOTE — Progress Notes (Signed)
Pt was in need of transportation to home, due to the inability to walk long distances.  Pt was given a taxi voucher.  Noha Karasik Tarpley-Carter, MSW, LCSW-A Pronouns:  She/Her/Hers [facility] HealthTransitions of Care Clinical Social Worker Direct Number:  [phone] [email or web address]

## 2022-04-28 NOTE — Discharge Instructions (Signed)
As discussed, your evaluation today has been largely reassuring.  But, it is important that you monitor your condition carefully, and do not hesitate to return to the ED if you develop new, or concerning changes in your condition. ? ?Otherwise, please follow-up with your physician for appropriate ongoing care. ? ?

## 2022-05-03 ENCOUNTER — Telehealth: Payer: Self-pay

## 2022-05-03 NOTE — Patient Outreach (Signed)
  Care Coordination TOC Note Transition Care Management Follow-up Telephone Call Date of discharge and from where: 04/28/22-[facility]  Dx: "atypical chest pain" Red on EMMI-ED Discharge Alert Reason: "Scheduled follow up appt? No" Red Alert Date: 04/30/22 How have you been since you were released from the hospital? Patient reports he has had no further chest pain episodes or other cardiac issues. Any questions or concerns? Yes-Patient reports he has rash to his back and legs. He mentioned it to staff while in ED and was advised to follow up with PCP regarding the matter. Patient has not called to make follow up appt with MD. Provided patient with contact info and instructed to call office today. He stets that MD had prescribed him some Cortisone cream in the past but he has ran out. He will request refill on med.   Items Reviewed: Did the pt receive and understand the discharge instructions provided? Yes  Medications obtained and verified?  Declined med review Other? Yes  Any new allergies since your discharge? No  Dietary orders reviewed? Yes Do you have support at home? Yes   Home Care and Equipment/Supplies: Were home health services ordered? not applicable If so, what is the name of the agency? N/A  Has the agency set up a time to come to the patient's home? not applicable Were any new equipment or medical supplies ordered?  No What is the name of the medical supply agency? N/A Were you able to get the supplies/equipment? not applicable Do you have any questions related to the use of the equipment or supplies? No  Functional Questionnaire: (I = Independent and D = Dependent) ADLs: I  Bathing/Dressing- I  Meal Prep- I  Eating- I  Maintaining continence- I  Transferring/Ambulation- I  Managing Meds- I  Follow up appointments reviewed:  PCP Hospital f/u appt confirmed?  Patient provided with office phone number-denies needing assistance making appt-will call office himself  . Specialist Hospital f/u appt confirmed?  N/A . Are transportation arrangements needed? No  If their condition worsens, is the pt aware to call PCP or go to the Emergency Dept.? Yes Was the patient provided with contact information for the PCP's office or ED? Yes Was to pt encouraged to call back with questions or concerns? Yes  SDOH assessments and interventions completed:   Yes SDOH Interventions Today    Flowsheet Row Most Recent Value  SDOH Interventions   Transportation Interventions Intervention Not Indicated       Care Coordination Interventions:  Education provided    Encounter Outcome:  Pt. Visit Completed    Antionette Fairy, RN,BSN,CCM [facility] Care Management Telephonic Care Management Coordinator Direct Phone: [phone] Toll Free: [phone] Fax: [phone]

## 2022-05-05 ENCOUNTER — Other Ambulatory Visit (HOSPITAL_COMMUNITY): Payer: Self-pay | Admitting: Emergency Medicine

## 2022-05-05 ENCOUNTER — Other Ambulatory Visit (HOSPITAL_COMMUNITY): Payer: Self-pay

## 2022-05-05 ENCOUNTER — Other Ambulatory Visit (HOSPITAL_COMMUNITY): Payer: Self-pay | Admitting: *Deleted

## 2022-05-05 NOTE — Progress Notes (Signed)
Paramedicine Encounter    Patient ID: Thomas West, male    DOB: 13-Jun-1960, 61 y.o.   MRN: [id number]   There were no vitals taken for this visit. Weight yesterday-not taken  Last visit weight-159lb CBG 147  [name] A&O x 4, skin W&D w/ good color.  Pt denies chest pain or SOB.  Lung sounds clear and equal bilat.  No edema noted.  He is still not taking any of his noon doses of Hydralazine.  Med box refilled x 1 week.  Refill needed on Jardiance and called in.    Message sent to HF Triage for refill on Hydrocortisone cream 1%.  Dr. Jones Broom showing to be an authorizing provider for same in the past.  No pending appointments for the remainder of the year.  Home visit complete.    Beatrix Shipper, EMT-Paramedic [phone] 05/05/2022 ` Patient Care Team: [name], [name], MD as PCP - General (Family Medicine) Wendall Stade, MD as PCP - Cardiology (Cardiology)  Patient Active Problem List   Diagnosis Date Noted  . Polyneuropathy associated with underlying disease (HCC) 01/15/2022  . Atherosclerosis of aorta (HCC) 01/15/2022  . History of CVA (cerebrovascular accident) without residual deficits 12/24/2021  . AKI (acute kidney injury) (HCC) 12/13/2021  . Diarrhea 12/13/2021  . Shortness of breath 12/13/2021  . Chest pain 12/13/2021  . Nausea and vomiting 12/13/2021  . Heart failure (HCC) 12/08/2021  . Hypertension associated with diabetes (HCC) 07/12/2021  . Chronic kidney disease, stage 3a (HCC) 07/12/2021  . Hyperkalemia 07/12/2021  . CHF exacerbation (HCC) 04/20/2021  . Acute on chronic combined systolic and diastolic CHF (congestive heart failure) (HCC) 03/09/2021  . Acute on chronic HFrEF (heart failure with reduced ejection fraction) (HCC) 03/08/2021  . HFrEF (heart failure with reduced ejection fraction) (HCC) 02/03/2021  . Diabetes mellitus (HCC) 03/27/2019  . Hyperlipidemia associated with type 2 diabetes mellitus (HCC) 03/27/2019  . CAD (coronary artery disease)  03/05/2019  . History of MI (myocardial infarction) 03/05/2019  . Type 2 diabetes mellitus with diabetic neuropathy, unspecified (HCC) 03/05/2019  . HLD (hyperlipidemia) 03/05/2019  . GERD (gastroesophageal reflux disease) 03/05/2019  . Hypertension, essential, benign 03/05/2019    Current Outpatient Medications:  .  aspirin EC 81 MG tablet, Take 1 tablet (81 mg total) by mouth daily. Swallow whole., Disp: 30 tablet, Rfl: 12 .  atorvastatin (LIPITOR) 80 MG tablet, Take 1 tablet (80 mg total) by mouth daily., Disp: 30 tablet, Rfl: 6 .  carvedilol (COREG) 6.25 MG tablet, Take 1 tablet (6.25 mg total) by mouth EVERY 12 HOURS (10AM &10PM), Disp: 60 tablet, Rfl: 6 .  clopidogrel (PLAVIX) 75 MG tablet, Take 1 tablet (75 mg total) by mouth daily., Disp: 30 tablet, Rfl: 3 .  Continuous Blood Gluc Receiver (DEXCOM G7 RECEIVER) DEVI, Use to check blood sugars, Disp: 3 each, Rfl: 3 .  cyanocobalamin 1000 MCG tablet, Take 1 tablet (1,000 mcg total) by mouth daily., Disp: 30 tablet, Rfl: 3 .  empagliflozin (JARDIANCE) 10 MG TABS tablet, Take 1 tablet (10 mg total) by mouth daily., Disp: 30 tablet, Rfl: 3 .  hydrALAZINE (APRESOLINE) 50 MG tablet, Take 1 tablet (50 mg total) by mouth 3 (three) times daily., Disp: 90 tablet, Rfl: 6 .  isosorbide mononitrate (IMDUR) 30 MG 24 hr tablet, Take 1 tablet (30 mg total) by mouth daily., Disp: 30 tablet, Rfl: 6 .  sacubitril-valsartan (ENTRESTO) 24-26 MG, Take 1 tablet by mouth 2 (two) times daily., Disp: 60 tablet, Rfl: 3 .  spironolactone (ALDACTONE) 25 MG tablet, Take 1 tablet (25 mg total) by mouth daily., Disp: 30 tablet, Rfl: 11 .  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  torsemide (DEMADEX) 20 MG tablet, Take 1 tablet (20 mg total) by mouth every Monday, Wednesday, and Friday., Disp: 30 tablet, Rfl: 3 Allergies  Allergen Reactions  . Bee Venom Anaphylaxis, Swelling and Other (See Comments)    Swells all over       Social History   Socioeconomic History  . Marital status: Single    Spouse name: Not on file  . Number of children: Not on file  . Years of education: Not on file  . Highest education level: Not on file  Occupational History  . Not on file  Tobacco Use  . Smoking status: Former    Types: Cigarettes    Quit date: 10/06/2017    Years since quitting: 4.5  . Smokeless tobacco: Never  Substance and Sexual Activity  . Alcohol use: Not Currently  . Drug use: Yes    Types: Marijuana  . Sexual activity: Not on file  Other Topics Concern  . Not on file  Social History Narrative  . Not on file   Social Determinants of Health   Financial Resource Strain: Low Risk  (01/18/2022)   Overall Financial Resource Strain (CARDIA)   . Difficulty of Paying Living Expenses: Not hard at all  Food Insecurity: No Food Insecurity (01/18/2022)   Hunger Vital Sign   . Worried About Programme researcher, broadcasting/film/video in the Last Year: Never true   . Ran Out of Food in the Last Year: Never true  Recent Concern: Food Insecurity - Food Insecurity Present (12/07/2021)   Hunger Vital Sign   . Worried About Programme researcher, broadcasting/film/video in the Last Year: Sometimes true   . Ran Out of Food in the Last Year: Never true  Transportation Needs: No Transportation Needs (05/03/2022)   PRAPARE - Transportation   . Lack of Transportation (Medical): No   . Lack of Transportation (Non-Medical): No  Physical Activity: Inactive (01/18/2022)   Exercise Vital Sign   . Days of Exercise per Week: 0 days   . Minutes of Exercise per Session: 0 min  Stress: Stress Concern Present (01/18/2022)   [employer] of Occupational Health - Occupational Stress Questionnaire   . Feeling of Stress : To some extent  Social Connections: Socially Isolated (01/18/2022)   Social Connection and Isolation Panel [NHANES]   . Frequency of Communication with Friends and Family: More than three times a week   . Frequency of Social Gatherings with Friends and  Family: More than three times a week   . Attends Religious Services: Never   . Active Member of Clubs or Organizations: No   . Attends Banker Meetings: Never   . Marital Status: Never married  Intimate Partner Violence: Not At Risk (01/18/2022)   Humiliation, Afraid, Rape, and Kick questionnaire   . Fear of Current or Ex-Partner: No   . Emotionally Abused: No   . Physically Abused: No   . Sexually Abused: No    Physical Exam      Future Appointments  Date Time Provider Department Center  06/14/2022 12:50 PM Glendale Chard, DO [facility] None       Beatrix Shipper, EMT-P-Paramedic [phone] [facility] Paramedic  05/05/22

## 2022-05-10 ENCOUNTER — Telehealth: Payer: Self-pay

## 2022-05-10 NOTE — Telephone Encounter (Signed)
     Patient  visit on 12/6  at [facility]    Have you been able to follow up with your primary care physician? Not yet but will follow up  The patient was or was not able to obtain any needed medicine or equipment. Yes   Are there diet recommendations that you are having difficulty following?  NA   Patient expresses understanding of discharge instructions and education provided has no other needs at this time. Yes     Lenard Forth [facility] Guide, [facility], Care Management  [phone] [address] [place], [place], [place] [address] Phone: [phone] Email: [email or web address]

## 2022-05-12 ENCOUNTER — Other Ambulatory Visit (HOSPITAL_COMMUNITY): Payer: Self-pay | Admitting: Emergency Medicine

## 2022-05-12 ENCOUNTER — Other Ambulatory Visit (HOSPITAL_COMMUNITY): Payer: Self-pay

## 2022-05-12 ENCOUNTER — Telehealth (HOSPITAL_COMMUNITY): Payer: Self-pay | Admitting: Pharmacy Technician

## 2022-05-12 NOTE — Telephone Encounter (Signed)
Patient Advocate Encounter   Received notification from [facility] that prior authorization for Vyndamax is required.   PA submitted on CoverMyMeds Key [id number] Status is pending   Will continue to follow.

## 2022-05-12 NOTE — Progress Notes (Signed)
Paramedicine Encounter    Patient ID: Thomas West, male    DOB: [birth year], 61 y.o.   MRN: [id number]   Wt 160 lb 6.4 oz (72.8 kg)   BMI 23.69 kg/m  Weight yesterday-not taken Last visit weight-155lb Cbg 138  ATF [name] A&O x 4, skin W&D w/ good color.  Pt. Has been 100% compliant with his medications for the first time.  He says, "I feel great!"  He denies chest pain or SOB.  No edema noted.  Lung sounds clear and equal bilaterally.  Pt's weight is up 5lbs since last visit.  He has a pending appt. 05/25/22 w/ his PCP Dr. [name].   Home visit complete.    Renee Ramus, [place] 05/12/2022  Patient Care Team: [name], [name], MD as PCP - General (Family Medicine) Josue Hector, MD as PCP - Cardiology (Cardiology)  Patient Active Problem List   Diagnosis Date Noted   Polyneuropathy associated with underlying disease ([place]) 01/15/2022   Atherosclerosis of aorta ([place]) 01/15/2022   History of CVA (cerebrovascular accident) without residual deficits 12/24/2021   AKI (acute kidney injury) ([place]) 12/13/2021   Diarrhea 12/13/2021   Shortness of breath 12/13/2021   Chest pain 12/13/2021   Nausea and vomiting 12/13/2021   Heart failure ([place]) 12/08/2021   Hypertension associated with diabetes ([place]) 07/12/2021   Chronic kidney disease, stage 3a ([place]) 07/12/2021   Hyperkalemia 07/12/2021   CHF exacerbation ([place]) 04/20/2021   Acute on chronic combined systolic and diastolic CHF (congestive heart failure) ([place]) 03/09/2021   Acute on chronic HFrEF (heart failure with reduced ejection fraction) ([place]) 03/08/2021   HFrEF (heart failure with reduced ejection fraction) ([place]) 02/03/2021   Diabetes mellitus ([place]) 03/27/2019   Hyperlipidemia associated with type 2 diabetes mellitus ([place]) 03/27/2019   CAD (coronary artery disease) 03/05/2019   History of MI (myocardial infarction) 03/05/2019   Type 2 diabetes mellitus with diabetic neuropathy, unspecified ([place]) 03/05/2019    HLD (hyperlipidemia) 03/05/2019   GERD (gastroesophageal reflux disease) 03/05/2019   Hypertension, essential, benign 03/05/2019    Current Outpatient Medications:    aspirin EC 81 MG tablet, Take 1 tablet (81 mg total) by mouth daily. Swallow whole., Disp: 30 tablet, Rfl: 12   atorvastatin (LIPITOR) 80 MG tablet, Take 1 tablet (80 mg total) by mouth daily., Disp: 30 tablet, Rfl: 6   carvedilol (COREG) 6.25 MG tablet, Take 1 tablet (6.25 mg total) by mouth EVERY 12 HOURS (10AM &10PM), Disp: 60 tablet, Rfl: 6   clopidogrel (PLAVIX) 75 MG tablet, Take 1 tablet (75 mg total) by mouth daily., Disp: 30 tablet, Rfl: 3   Continuous Blood Gluc Receiver ([place]) DEVI, Use to check blood sugars, Disp: 3 each, Rfl: 3   cyanocobalamin 1000 MCG tablet, Take 1 tablet (1,000 mcg total) by mouth daily., Disp: 30 tablet, Rfl: 3   empagliflozin (JARDIANCE) 10 MG TABS tablet, Take 1 tablet (10 mg total) by mouth daily., Disp: 30 tablet, Rfl: 3   hydrALAZINE (APRESOLINE) 50 MG tablet, Take 1 tablet (50 mg total) by mouth 3 (three) times daily., Disp: 90 tablet, Rfl: 6   isosorbide mononitrate (IMDUR) 30 MG 24 hr tablet, Take 1 tablet (30 mg total) by mouth daily., Disp: 30 tablet, Rfl: 6   sacubitril-valsartan (ENTRESTO) 24-26 MG, Take 1 tablet by mouth 2 (two) times daily., Disp: 60 tablet, Rfl: 3   spironolactone (ALDACTONE) 25 MG tablet, Take 1 tablet (25 mg total) by mouth daily., Disp: 30 tablet, Rfl: 11  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, Take 1 capsule by mouth daily., Disp: 30 capsule, Rfl: 11   torsemide (DEMADEX) 20 MG tablet, Take 1 tablet (20 mg total) by mouth every Monday, Wednesday, and Friday., Disp: 30 tablet, Rfl: 3 Allergies  Allergen Reactions   Bee Venom Anaphylaxis, Swelling and Other (See Comments)    Swells all over      Social History   Socioeconomic History   Marital status: Single    Spouse name: Not on file   Number of children: Not on file   Years of education: Not on file    Highest education level: Not on file  Occupational History   Not on file  Tobacco Use   Smoking status: Former    Types: Cigarettes    Quit date: 10/06/2017    Years since quitting: 4.6   Smokeless tobacco: Never  Substance and Sexual Activity   Alcohol use: Not Currently   Drug use: Yes    Types: Marijuana   Sexual activity: Not on file  Other Topics Concern   Not on file  Social History Narrative   Not on file   Social Determinants of Health   Financial Resource Strain: Low Risk  (01/18/2022)   Overall Financial Resource Strain (CARDIA)    Difficulty of Paying Living Expenses: Not hard at all  Food Insecurity: No Food Insecurity (01/18/2022)   Hunger Vital Sign    Worried About Running Out of Food in the Last Year: Never true    Ran Out of Food in the Last Year: Never true  Recent Concern: Food Insecurity - Food Insecurity Present (12/07/2021)   Hunger Vital Sign    Worried About Running Out of Food in the Last Year: Sometimes true    Ran Out of Food in the Last Year: Never true  Transportation Needs: No Transportation Needs (05/03/2022)   PRAPARE - Administrator, Civil Service (Medical): No    Lack of Transportation (Non-Medical): No  Physical Activity: Inactive (01/18/2022)   Exercise Vital Sign    Days of Exercise per Week: 0 days    Minutes of Exercise per Session: 0 min  Stress: Stress Concern Present (01/18/2022)   [employer] of Occupational Health - Occupational Stress Questionnaire    Feeling of Stress : To some extent  Social Connections: Socially Isolated (01/18/2022)   Social Connection and Isolation Panel [NHANES]    Frequency of Communication with Friends and Family: More than three times a week    Frequency of Social Gatherings with Friends and Family: More than three times a week    Attends Religious Services: Never    Database administrator or Organizations: No    Attends Banker Meetings: Never    Marital Status: Never  married  Intimate Partner Violence: Not At Risk (01/18/2022)   Humiliation, Afraid, Rape, and Kick questionnaire    Fear of Current or Ex-Partner: No    Emotionally Abused: No    Physically Abused: No    Sexually Abused: No    Physical Exam      Future Appointments  Date Time Provider Department Center  05/25/2022 11:30 AM [name], [name], MD [facility]  06/14/2022 12:50 PM Glendale Chard, DO [facility] None       Beatrix Shipper, EMT-P-Paramedic [phone] [facility] Paramedic  05/12/22

## 2022-05-18 ENCOUNTER — Other Ambulatory Visit (HOSPITAL_COMMUNITY): Payer: Self-pay

## 2022-05-19 ENCOUNTER — Other Ambulatory Visit (HOSPITAL_COMMUNITY): Payer: Self-pay | Admitting: Emergency Medicine

## 2022-05-19 NOTE — Progress Notes (Signed)
Paramedicine Encounter    Patient ID: Thomas West, male    DOB: 02-22-1961, 61 y.o.   MRN: [id number]   There were no vitals taken for this visit. Weight yesterday-not taken Last visit weight-160.4lb  Arrived @ residence and Mr. Ferris was not home.  His son was home and I was given permission from Mr. Foote via phone call to reconcile his meds w/o him present.  Meds reconciled x 1 week.  Scheduled next weeks visit 05/26/22. Med rec complete.    Beatrix Shipper, EMT-Paramedic [phone] 05/19/2022   Patient Care Team: [name], [name], MD as PCP - General (Family Medicine) Wendall Stade, MD as PCP - Cardiology (Cardiology)  Patient Active Problem List   Diagnosis Date Noted   Polyneuropathy associated with underlying disease (HCC) 01/15/2022   Atherosclerosis of aorta (HCC) 01/15/2022   History of CVA (cerebrovascular accident) without residual deficits 12/24/2021   AKI (acute kidney injury) (HCC) 12/13/2021   Diarrhea 12/13/2021   Shortness of breath 12/13/2021   Chest pain 12/13/2021   Nausea and vomiting 12/13/2021   Heart failure (HCC) 12/08/2021   Hypertension associated with diabetes (HCC) 07/12/2021   Chronic kidney disease, stage 3a (HCC) 07/12/2021   Hyperkalemia 07/12/2021   CHF exacerbation (HCC) 04/20/2021   Acute on chronic combined systolic and diastolic CHF (congestive heart failure) (HCC) 03/09/2021   Acute on chronic HFrEF (heart failure with reduced ejection fraction) (HCC) 03/08/2021   HFrEF (heart failure with reduced ejection fraction) (HCC) 02/03/2021   Diabetes mellitus (HCC) 03/27/2019   Hyperlipidemia associated with type 2 diabetes mellitus (HCC) 03/27/2019   CAD (coronary artery disease) 03/05/2019   History of MI (myocardial infarction) 03/05/2019   Type 2 diabetes mellitus with diabetic neuropathy, unspecified (HCC) 03/05/2019   HLD (hyperlipidemia) 03/05/2019   GERD (gastroesophageal reflux disease) 03/05/2019   Hypertension, essential,  benign 03/05/2019    Current Outpatient Medications:    aspirin EC 81 MG tablet, Take 1 tablet (81 mg total) by mouth daily. Swallow whole., Disp: 30 tablet, Rfl: 12   atorvastatin (LIPITOR) 80 MG tablet, Take 1 tablet (80 mg total) by mouth daily., Disp: 30 tablet, Rfl: 6   carvedilol (COREG) 6.25 MG tablet, Take 1 tablet (6.25 mg total) by mouth EVERY 12 HOURS (10AM &10PM), Disp: 60 tablet, Rfl: 6   clopidogrel (PLAVIX) 75 MG tablet, Take 1 tablet (75 mg total) by mouth daily., Disp: 30 tablet, Rfl: 3   Continuous Blood Gluc Receiver (DEXCOM G7 RECEIVER) DEVI, Use to check blood sugars, Disp: 3 each, Rfl: 3   cyanocobalamin 1000 MCG tablet, Take 1 tablet (1,000 mcg total) by mouth daily., Disp: 30 tablet, Rfl: 3   empagliflozin (JARDIANCE) 10 MG TABS tablet, Take 1 tablet (10 mg total) by mouth daily., Disp: 30 tablet, Rfl: 3   hydrALAZINE (APRESOLINE) 50 MG tablet, Take 1 tablet (50 mg total) by mouth 3 (three) times daily., Disp: 90 tablet, Rfl: 6   isosorbide mononitrate (IMDUR) 30 MG 24 hr tablet, Take 1 tablet (30 mg total) by mouth daily., Disp: 30 tablet, Rfl: 6   sacubitril-valsartan (ENTRESTO) 24-26 MG, Take 1 tablet by mouth 2 (two) times daily., Disp: 60 tablet, Rfl: 3   spironolactone (ALDACTONE) 25 MG tablet, Take 1 tablet (25 mg total) by mouth daily., Disp: 30 tablet, Rfl: 11   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, Take 1 capsule by mouth daily., Disp: 30 capsule, Rfl: 11   torsemide (DEMADEX) 20 MG tablet, Take 1 tablet (20 mg total) by mouth every  Monday, Wednesday, and Friday., Disp: 30 tablet, Rfl: 3 Allergies  Allergen Reactions   Bee Venom Anaphylaxis, Swelling and Other (See Comments)    Swells all over      Social History   Socioeconomic History   Marital status: Single    Spouse name: Not on file   Number of children: Not on file   Years of education: Not on file   Highest education level: Not on file  Occupational History   Not on file  Tobacco Use   Smoking status:  Former    Types: Cigarettes    Quit date: 10/06/2017    Years since quitting: 4.6   Smokeless tobacco: Never  Substance and Sexual Activity   Alcohol use: Not Currently   Drug use: Yes    Types: Marijuana   Sexual activity: Not on file  Other Topics Concern   Not on file  Social History Narrative   Not on file   Social Determinants of Health   Financial Resource Strain: Low Risk  (01/18/2022)   Overall Financial Resource Strain (CARDIA)    Difficulty of Paying Living Expenses: Not hard at all  Food Insecurity: No Food Insecurity (01/18/2022)   Hunger Vital Sign    Worried About Running Out of Food in the Last Year: Never true    Ran Out of Food in the Last Year: Never true  Recent Concern: Food Insecurity - Food Insecurity Present (12/07/2021)   Hunger Vital Sign    Worried About Running Out of Food in the Last Year: Sometimes true    Ran Out of Food in the Last Year: Never true  Transportation Needs: No Transportation Needs (05/03/2022)   PRAPARE - Administrator, Civil Service (Medical): No    Lack of Transportation (Non-Medical): No  Physical Activity: Inactive (01/18/2022)   Exercise Vital Sign    Days of Exercise per Week: 0 days    Minutes of Exercise per Session: 0 min  Stress: Stress Concern Present (01/18/2022)   [employer] of Occupational Health - Occupational Stress Questionnaire    Feeling of Stress : To some extent  Social Connections: Socially Isolated (01/18/2022)   Social Connection and Isolation Panel [NHANES]    Frequency of Communication with Friends and Family: More than three times a week    Frequency of Social Gatherings with Friends and Family: More than three times a week    Attends Religious Services: Never    Database administrator or Organizations: No    Attends Banker Meetings: Never    Marital Status: Never married  Intimate Partner Violence: Not At Risk (01/18/2022)   Humiliation, Afraid, Rape, and Kick  questionnaire    Fear of Current or Ex-Partner: No    Emotionally Abused: No    Physically Abused: No    Sexually Abused: No    Physical Exam      Future Appointments  Date Time Provider Department Center  05/25/2022 11:30 AM [name], [name], MD [facility]  06/14/2022 12:50 PM Glendale Chard, DO [facility] None       Beatrix Shipper, Paramedic-Paramedic [phone] [facility] Paramedic  05/19/22

## 2022-05-21 NOTE — Progress Notes (Unsigned)
HPI:  Mr.Keyonta Buckhalter is a 61 y.o. male, who is here today to follow on recent visit.  Review of Systems See other pertinent positives and negatives in HPI.  Current Outpatient Medications on File Prior to Visit  Medication Sig Dispense Refill   aspirin EC 81 MG tablet Take 1 tablet (81 mg total) by mouth daily. Swallow whole. 30 tablet 12   atorvastatin (LIPITOR) 80 MG tablet Take 1 tablet (80 mg total) by mouth daily. 30 tablet 6   carvedilol (COREG) 6.25 MG tablet Take 1 tablet (6.25 mg total) by mouth EVERY 12 HOURS (10AM &10PM) 60 tablet 6   clopidogrel (PLAVIX) 75 MG tablet Take 1 tablet (75 mg total) by mouth daily. 30 tablet 3   Continuous Blood Gluc Receiver (DEXCOM G7 RECEIVER) DEVI Use to check blood sugars 3 each 3   cyanocobalamin 1000 MCG tablet Take 1 tablet (1,000 mcg total) by mouth daily. 30 tablet 3   empagliflozin (JARDIANCE) 10 MG TABS tablet Take 1 tablet (10 mg total) by mouth daily. 30 tablet 3   hydrALAZINE (APRESOLINE) 50 MG tablet Take 1 tablet (50 mg total) by mouth 3 (three) times daily. 90 tablet 6   isosorbide mononitrate (IMDUR) 30 MG 24 hr tablet Take 1 tablet (30 mg total) by mouth daily. 30 tablet 6   sacubitril-valsartan (ENTRESTO) 24-26 MG Take 1 tablet by mouth 2 (two) times daily. 60 tablet 3   spironolactone (ALDACTONE) 25 MG tablet Take 1 tablet (25 mg total) by mouth daily. 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 30 capsule 11   torsemide (DEMADEX) 20 MG tablet Take 1 tablet (20 mg total) by mouth every Monday, Wednesday, and Friday. 30 tablet 3   No current facility-administered medications on file prior to visit.    Past Medical History:  Diagnosis Date   CHF (congestive heart failure) (HCC)    Coronary artery disease    Diabetes mellitus without complication (HCC)    History of MI (myocardial infarction) 03/05/2019   Hypertension    Stroke (HCC)    Allergies  Allergen Reactions   Bee Venom Anaphylaxis,  Swelling and Other (See Comments)    Swells all over    Social History   Socioeconomic History   Marital status: Single    Spouse name: Not on file   Number of children: Not on file   Years of education: Not on file   Highest education level: Not on file  Occupational History   Not on file  Tobacco Use   Smoking status: Former    Types: Cigarettes    Quit date: 10/06/2017    Years since quitting: 4.6   Smokeless tobacco: Never  Substance and Sexual Activity   Alcohol use: Not Currently   Drug use: Yes    Types: Marijuana   Sexual activity: Not on file  Other Topics Concern   Not on file  Social History Narrative   Not on file   Social Determinants of Health   Financial Resource Strain: Low Risk  (01/18/2022)   Overall Financial Resource Strain (CARDIA)    Difficulty of Paying Living Expenses: Not hard at all  Food Insecurity: No Food Insecurity (01/18/2022)   Hunger Vital Sign    Worried About Running Out of Food in the Last Year: Never true    Ran Out of Food in the Last Year: Never true  Recent Concern: Food Insecurity - Food Insecurity Present (12/07/2021)  Hunger Vital Sign    Worried About Running Out of Food in the Last Year: Sometimes true    Ran Out of Food in the Last Year: Never true  Transportation Needs: No Transportation Needs (05/03/2022)   PRAPARE - Administrator, Civil Service (Medical): No    Lack of Transportation (Non-Medical): No  Physical Activity: Inactive (01/18/2022)   Exercise Vital Sign    Days of Exercise per Week: 0 days    Minutes of Exercise per Session: 0 min  Stress: Stress Concern Present (01/18/2022)   [employer] of Occupational Health - Occupational Stress Questionnaire    Feeling of Stress : To some extent  Social Connections: Socially Isolated (01/18/2022)   Social Connection and Isolation Panel [NHANES]    Frequency of Communication with Friends and Family: More than three times a week    Frequency of Social  Gatherings with Friends and Family: More than three times a week    Attends Religious Services: Never    Database administrator or Organizations: No    Attends Banker Meetings: Never    Marital Status: Never married    There were no vitals filed for this visit. There is no height or weight on file to calculate BMI.  Physical Exam  ASSESSMENT AND PLAN:  There are no diagnoses linked to this encounter.  No orders of the defined types were placed in this encounter.   No problem-specific Assessment & Plan notes found for this encounter.   No follow-ups on file.  [name]. [name], MD  [facility]. [place] office.

## 2022-05-25 ENCOUNTER — Encounter: Payer: Self-pay | Admitting: Family Medicine

## 2022-05-25 ENCOUNTER — Ambulatory Visit (INDEPENDENT_AMBULATORY_CARE_PROVIDER_SITE_OTHER): Payer: Medicare Other | Admitting: Family Medicine

## 2022-05-25 VITALS — BP 110/80 | HR 84 | Temp 98.8°F | Resp 16 | Ht 69.0 in | Wt 159.1 lb

## 2022-05-25 DIAGNOSIS — E1140 Type 2 diabetes mellitus with diabetic neuropathy, unspecified: Secondary | ICD-10-CM

## 2022-05-25 DIAGNOSIS — E854 Organ-limited amyloidosis: Secondary | ICD-10-CM | POA: Diagnosis not present

## 2022-05-25 DIAGNOSIS — N1831 Chronic kidney disease, stage 3a: Secondary | ICD-10-CM | POA: Diagnosis not present

## 2022-05-25 DIAGNOSIS — I5020 Unspecified systolic (congestive) heart failure: Secondary | ICD-10-CM

## 2022-05-25 DIAGNOSIS — L282 Other prurigo: Secondary | ICD-10-CM | POA: Insufficient documentation

## 2022-05-25 DIAGNOSIS — I43 Cardiomyopathy in diseases classified elsewhere: Secondary | ICD-10-CM

## 2022-05-25 DIAGNOSIS — I10 Essential (primary) hypertension: Secondary | ICD-10-CM

## 2022-05-25 DIAGNOSIS — I700 Atherosclerosis of aorta: Secondary | ICD-10-CM

## 2022-05-25 DIAGNOSIS — G63 Polyneuropathy in diseases classified elsewhere: Secondary | ICD-10-CM

## 2022-05-25 LAB — POCT GLYCOSYLATED HEMOGLOBIN (HGB A1C): Hemoglobin A1C: 7.5 % — AB (ref 4.0–5.6)

## 2022-05-25 MED ORDER — HYDROCORTISONE 1 % EX CREA: 1.0000 | TOPICAL_CREAM | Freq: Two times a day (BID) | CUTANEOUS | 1 refills | Status: AC | PRN

## 2022-05-25 MED ORDER — EMPAGLIFLOZIN 25 MG PO TABS: 25.0000 mg | ORAL_TABLET | Freq: Every day | ORAL | 1 refills | Status: DC

## 2022-05-25 NOTE — Assessment & Plan Note (Addendum)
Continue Atorvastatin 80 mg daily and Aspirin 81 mg daily. Last LDL 86 in 02/2021. He is not fasting today, so will plan on FLP next visit.

## 2022-05-25 NOTE — Assessment & Plan Note (Signed)
Problem has been stable. Cr 1.6 and GFR 46. +Microalbuminuria. Continue adequate hydration, BP and glucose controlled. Low salt diet recommended and avoidance of NSAID's.

## 2022-05-25 NOTE — Assessment & Plan Note (Signed)
HgA1C is not at goal, it went from 7.3 to 7.5. Jardiance increased from 10 mg to 25 mg daily. No changes in current management. Regular exercise and healthy diet with avoidance of added sugar food intake is an important part of treatment and recommended. Annual eye exam, periodic dental and foot care recommended. F/U in 4 months.

## 2022-05-25 NOTE — Assessment & Plan Note (Addendum)
Gabapentin did not help. He has an appt with neurologist on 06/14/22, appt information given. Continue appropriate foot care.

## 2022-05-25 NOTE — Assessment & Plan Note (Signed)
BP adequately controlled. Continue carvedilol 6.25 mg twice daily, hydralazine 50 mg 3 times daily, Imdur 30 mg daily, spironolactone 25 mg daily, and Entresto same dose. Recommend monitoring BP regularly. Low-salt diet to continue. He is overdue for eye exam.

## 2022-05-25 NOTE — Patient Instructions (Addendum)
A few things to remember from today's visit:  Type 2 diabetes mellitus with diabetic neuropathy, without long-term current use of insulin (Thomas West) - Plan: POC HgB A1c, empagliflozin (JARDIANCE) 25 MG TABS tablet  Hypertension, essential, benign  HFrEF (heart failure with reduced ejection fraction) (HCC) - Plan: empagliflozin (JARDIANCE) 25 MG TABS tablet  Chronic kidney disease, stage 3a (HCC)  Cardiac amyloidosis (HCC)  Polyneuropathy associated with underlying disease (Thomas West), Chronic  Atherosclerosis of aorta (Thomas West), Chronic  Pruritic rash - Plan: hydrocortisone cream 1 %  Please call to arrange appt with gastro and keep appt with Dr Thomas West, neurologist. Thomas West increased from 10 mg to 25 mg today because diabetes is not well controlled. Next visit come fasting , so we can check your cholesterol.  If you need refills for medications you take chronically, please call your pharmacy. Do not use My Chart to request refills or for acute issues that need immediate attention. If you send a my chart message, it may take a few days to be addressed, specially if I am not in the office.  Please be sure medication list is accurate. If a new problem present, please set up appointment sooner than planned today.

## 2022-05-26 ENCOUNTER — Other Ambulatory Visit (HOSPITAL_COMMUNITY): Payer: Self-pay | Admitting: Family Medicine

## 2022-05-26 ENCOUNTER — Other Ambulatory Visit: Payer: Self-pay | Admitting: Family Medicine

## 2022-05-26 ENCOUNTER — Other Ambulatory Visit: Payer: Self-pay

## 2022-05-26 ENCOUNTER — Other Ambulatory Visit (HOSPITAL_COMMUNITY): Payer: Self-pay | Admitting: Emergency Medicine

## 2022-05-26 ENCOUNTER — Other Ambulatory Visit (HOSPITAL_COMMUNITY): Payer: Self-pay

## 2022-05-26 MED ORDER — ENTRESTO 24-26 MG PO TABS
1.0000 | ORAL_TABLET | Freq: Two times a day (BID) | ORAL | 3 refills | Status: DC
  Filled 2022-05-26: qty 60, 30d supply, fill #0
  Filled 2022-07-01: qty 60, 30d supply, fill #1
  Filled 2022-08-11: qty 60, 30d supply, fill #2
  Filled 2022-09-15: qty 60, 30d supply, fill #3

## 2022-05-26 NOTE — Progress Notes (Signed)
Paramedicine Encounter    Patient ID: Thomas West, male    DOB: 11-29-1960, 62 y.o.   MRN: [id number]   BP (!) 180/120 (BP Location: Left Arm, Patient Position: Sitting)   Pulse 87   Resp 16   Wt 158 lb 3.2 oz (71.8 kg)   SpO2 98%   BMI 23.36 kg/m  Weight yesterday-not taken Last visit weight-160lb  ATF Mr. Tutson A&O x 4, skin W&D w/ good color.  Pt states, "I'm feeling fine"   He denies chest pain or SOB.  Lung sounds are clear and equal bilat.  No edema noted to his lower extremities.  He has missed multiple med doses.  Encouraged him to be more diligent in his med compliance.  Misses almost all noon doses of Hydralazine.  Refills called into [facility].  Med box reconciled x 1 week.  Will keep close check on blood pressure next visit. Home visit complete.    Beatrix Shipper, EMT-Paramedic [phone] 05/26/2022   Patient Care Team: [name], [name], MD as PCP - General (Family Medicine) Wendall Stade, MD as PCP - Cardiology (Cardiology)  Patient Active Problem List   Diagnosis Date Noted   Cardiac amyloidosis [facility]) 05/25/2022   Polyneuropathy associated with underlying disease (HCC) 01/15/2022   Atherosclerosis of aorta (HCC) 01/15/2022   History of CVA (cerebrovascular accident) without residual deficits 12/24/2021   AKI (acute kidney injury) (HCC) 12/13/2021   Diarrhea 12/13/2021   Shortness of breath 12/13/2021   Chest pain 12/13/2021   Nausea and vomiting 12/13/2021   Heart failure (HCC) 12/08/2021   Hypertension associated with diabetes (HCC) 07/12/2021   Chronic kidney disease, stage 3a (HCC) 07/12/2021   Hyperkalemia 07/12/2021   CHF exacerbation (HCC) 04/20/2021   Acute on chronic combined systolic and diastolic CHF (congestive heart failure) (HCC) 03/09/2021   Acute on chronic HFrEF (heart failure with reduced ejection fraction) (HCC) 03/08/2021   HFrEF (heart failure with reduced ejection fraction) (HCC) 02/03/2021   Diabetes mellitus (HCC) 03/27/2019    Hyperlipidemia associated with type 2 diabetes mellitus (HCC) 03/27/2019   CAD (coronary artery disease) 03/05/2019   History of MI (myocardial infarction) 03/05/2019   Type 2 diabetes mellitus with diabetic neuropathy, unspecified (HCC) 03/05/2019   HLD (hyperlipidemia) 03/05/2019   GERD (gastroesophageal reflux disease) 03/05/2019   Hypertension, essential, benign 03/05/2019    Current Outpatient Medications:    aspirin EC 81 MG tablet, Take 1 tablet (81 mg total) by mouth daily. Swallow whole., Disp: 30 tablet, Rfl: 12   atorvastatin (LIPITOR) 80 MG tablet, Take 1 tablet (80 mg total) by mouth daily., Disp: 30 tablet, Rfl: 6   carvedilol (COREG) 6.25 MG tablet, Take 1 tablet (6.25 mg total) by mouth EVERY 12 HOURS (10AM &10PM), Disp: 60 tablet, Rfl: 6   clopidogrel (PLAVIX) 75 MG tablet, Take 1 tablet (75 mg total) by mouth daily., Disp: 30 tablet, Rfl: 3   Continuous Blood Gluc Receiver (DEXCOM G7 RECEIVER) DEVI, Use to check blood sugars, Disp: 3 each, Rfl: 3   empagliflozin (JARDIANCE) 25 MG TABS tablet, Take 1 tablet (25 mg total) by mouth daily before breakfast., Disp: 90 tablet, Rfl: 1   hydrALAZINE (APRESOLINE) 50 MG tablet, Take 1 tablet (50 mg total) by mouth 3 (three) times daily., Disp: 90 tablet, Rfl: 6   hydrocortisone cream 1 %, Apply 1 Application topically 2 (two) times daily as needed for itching., Disp: 45 g, Rfl: 1   isosorbide mononitrate (IMDUR) 30 MG 24 hr tablet, Take  1 tablet (30 mg total) by mouth daily., Disp: 30 tablet, Rfl: 6   spironolactone (ALDACTONE) 25 MG tablet, Take 1 tablet (25 mg total) by mouth daily., Disp: 30 tablet, Rfl: 11   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, Take 1 capsule by mouth daily., Disp: 30 capsule, Rfl: 11   torsemide (DEMADEX) 20 MG tablet, Take 1 tablet (20 mg total) by mouth every Monday, Wednesday, and Friday., Disp: 30 tablet, Rfl: 3   cyanocobalamin 1000 MCG tablet, TAKE ONE TABLET BY MOUTH DAILY, Disp: 30 tablet, Rfl: 2   sacubitril-valsartan  (ENTRESTO) 24-26 MG, Take 1 tablet by mouth 2 (two) times daily., Disp: 60 tablet, Rfl: 3 Allergies  Allergen Reactions   Bee Venom Anaphylaxis, Swelling and Other (See Comments)    Swells all over      Social History   Socioeconomic History   Marital status: Single    Spouse name: Not on file   Number of children: Not on file   Years of education: Not on file   Highest education level: Not on file  Occupational History   Not on file  Tobacco Use   Smoking status: Former    Types: Cigarettes    Quit date: 10/06/2017    Years since quitting: 4.6   Smokeless tobacco: Never  Substance and Sexual Activity   Alcohol use: Not Currently   Drug use: Yes    Types: Marijuana   Sexual activity: Not on file  Other Topics Concern   Not on file  Social History Narrative   Not on file   Social Determinants of Health   Financial Resource Strain: Low Risk  (01/18/2022)   Overall Financial Resource Strain (CARDIA)    Difficulty of Paying Living Expenses: Not hard at all  Food Insecurity: No Food Insecurity (01/18/2022)   Hunger Vital Sign    Worried About Running Out of Food in the Last Year: Never true    Ran Out of Food in the Last Year: Never true  Recent Concern: Food Insecurity - Food Insecurity Present (12/07/2021)   Hunger Vital Sign    Worried About Running Out of Food in the Last Year: Sometimes true    Ran Out of Food in the Last Year: Never true  Transportation Needs: No Transportation Needs (05/03/2022)   PRAPARE - Hydrologist (Medical): No    Lack of Transportation (Non-Medical): No  Physical Activity: Inactive (01/18/2022)   Exercise Vital Sign    Days of Exercise per Week: 0 days    Minutes of Exercise per Session: 0 min  Stress: Stress Concern Present (01/18/2022)   [place]    Feeling of Stress : To some extent  Social Connections: Socially Isolated (01/18/2022)    Social Connection and Isolation Panel [NHANES]    Frequency of Communication with Friends and Family: More than three times a week    Frequency of Social Gatherings with Friends and Family: More than three times a week    Attends Religious Services: Never    Marine scientist or Organizations: No    Attends Archivist Meetings: Never    Marital Status: Never married  Intimate Partner Violence: Not At Risk (01/18/2022)   Humiliation, Afraid, Rape, and Kick questionnaire    Fear of Current or Ex-Partner: No    Emotionally Abused: No    Physically Abused: No    Sexually Abused: No    Physical Exam  Future Appointments  Date Time Provider [place]  06/14/2022 12:50 PM Alda Berthold, DO [facility] None  06/29/2022  9:15 AM [place], Gerarda Gunther, RD [facility] NDM  09/24/2022 11:00 AM [name], [name], MD [facility] [place], [place] [facility] Paramedic  05/26/22

## 2022-05-27 NOTE — Assessment & Plan Note (Signed)
Asymptomatic. Jardiance increased from 10 mg to 25 mg to better control glucose. He is on Entresto and torsemide. Continue low salt diet. Follows with cardiologist.

## 2022-05-27 NOTE — Assessment & Plan Note (Signed)
Possible etiologies discussed. ? Eczema. Hydrocortisone 1% cream has helped in the past, so refill sent. Some side effects of topical steroid discussed. Daily moisturizers may also help. If not improved, we may need dermatologist consultation.

## 2022-05-27 NOTE — Assessment & Plan Note (Signed)
Following with cardiologist 

## 2022-05-31 ENCOUNTER — Encounter (HOSPITAL_COMMUNITY): Payer: Self-pay

## 2022-05-31 ENCOUNTER — Emergency Department (HOSPITAL_COMMUNITY): Payer: Medicare Other

## 2022-05-31 ENCOUNTER — Other Ambulatory Visit: Payer: Self-pay

## 2022-05-31 ENCOUNTER — Emergency Department (HOSPITAL_COMMUNITY)
Admission: EM | Admit: 2022-05-31 | Discharge: 2022-06-01 | Disposition: A | Discharge status: Home / Self Care | Payer: Medicare Other | Attending: Emergency Medicine | Admitting: Emergency Medicine

## 2022-05-31 DIAGNOSIS — R112 Nausea with vomiting, unspecified: Secondary | ICD-10-CM | POA: Diagnosis not present

## 2022-05-31 DIAGNOSIS — I2510 Atherosclerotic heart disease of native coronary artery without angina pectoris: Secondary | ICD-10-CM | POA: Diagnosis not present

## 2022-05-31 DIAGNOSIS — Z79899 Other long term (current) drug therapy: Secondary | ICD-10-CM | POA: Insufficient documentation

## 2022-05-31 DIAGNOSIS — I110 Hypertensive heart disease with heart failure: Secondary | ICD-10-CM | POA: Insufficient documentation

## 2022-05-31 DIAGNOSIS — J40 Bronchitis, not specified as acute or chronic: Secondary | ICD-10-CM | POA: Diagnosis not present

## 2022-05-31 DIAGNOSIS — I509 Heart failure, unspecified: Secondary | ICD-10-CM | POA: Insufficient documentation

## 2022-05-31 DIAGNOSIS — Z7982 Long term (current) use of aspirin: Secondary | ICD-10-CM | POA: Diagnosis not present

## 2022-05-31 DIAGNOSIS — Z1152 Encounter for screening for COVID-19: Secondary | ICD-10-CM | POA: Insufficient documentation

## 2022-05-31 DIAGNOSIS — R0602 Shortness of breath: Secondary | ICD-10-CM | POA: Diagnosis not present

## 2022-05-31 DIAGNOSIS — R0789 Other chest pain: Secondary | ICD-10-CM | POA: Diagnosis not present

## 2022-05-31 DIAGNOSIS — R109 Unspecified abdominal pain: Secondary | ICD-10-CM | POA: Diagnosis not present

## 2022-05-31 DIAGNOSIS — E119 Type 2 diabetes mellitus without complications: Secondary | ICD-10-CM | POA: Diagnosis not present

## 2022-05-31 DIAGNOSIS — Z7902 Long term (current) use of antithrombotics/antiplatelets: Secondary | ICD-10-CM | POA: Diagnosis not present

## 2022-05-31 DIAGNOSIS — K4090 Unilateral inguinal hernia, without obstruction or gangrene, not specified as recurrent: Secondary | ICD-10-CM | POA: Insufficient documentation

## 2022-05-31 DIAGNOSIS — R059 Cough, unspecified: Secondary | ICD-10-CM | POA: Diagnosis not present

## 2022-05-31 DIAGNOSIS — R079 Chest pain, unspecified: Secondary | ICD-10-CM | POA: Diagnosis not present

## 2022-05-31 DIAGNOSIS — R051 Acute cough: Secondary | ICD-10-CM

## 2022-05-31 LAB — CBC
HCT: 40.8 % (ref 39.0–52.0)
Hemoglobin: 13.7 g/dL (ref 13.0–17.0)
MCH: 29.1 pg (ref 26.0–34.0)
MCHC: 33.6 g/dL (ref 30.0–36.0)
MCV: 86.8 fL (ref 80.0–100.0)
Platelets: 323 10*3/uL (ref 150–400)
RBC: 4.7 MIL/uL (ref 4.22–5.81)
RDW: 14.9 % (ref 11.5–15.5)
WBC: 8.8 10*3/uL (ref 4.0–10.5)
nRBC: 0 % (ref 0.0–0.2)

## 2022-05-31 LAB — BASIC METABOLIC PANEL
Anion gap: 8 (ref 5–15)
BUN: 19 mg/dL (ref 6–20)
CO2: 21 mmol/L — ABNORMAL LOW (ref 22–32)
Calcium: 8 mg/dL — ABNORMAL LOW (ref 8.9–10.3)
Chloride: 107 mmol/L (ref 98–111)
Creatinine, Ser: 1.39 mg/dL — ABNORMAL HIGH (ref 0.61–1.24)
GFR, Estimated: 58 mL/min — ABNORMAL LOW (ref >60)
Glucose, Bld: 152 mg/dL — ABNORMAL HIGH (ref 70–99)
Potassium: 4 mmol/L (ref 3.5–5.1)
Sodium: 136 mmol/L (ref 135–145)

## 2022-05-31 LAB — TROPONIN I (HIGH SENSITIVITY): Troponin I (High Sensitivity): 15 ng/L (ref <18)

## 2022-05-31 NOTE — ED Provider Triage Note (Signed)
Emergency Medicine Provider Triage Evaluation Note  Thomas West , a 62 y.o. male  was evaluated in triage.  Pt complains of chest pain and shortness of breath.  Pt has a history of congestive heart failure  Review of Systems  Positive: cough Negative: fever  Physical Exam  BP 116/81   Pulse 100   Temp 99.8 F (37.7 C)   Resp 16   Ht 5\' 9"  (1.753 m)   Wt 70.8 kg   SpO2 96%   BMI 23.04 kg/m  Gen:   Awake, no distress   Resp:  Normal effort  MSK:   Moves extremities without difficulty  Other:    Medical Decision Making  Medically screening exam initiated at 9:42 PM.  Appropriate orders placed.  Thomas West was informed that the remainder of the evaluation will be completed by another provider, this initial triage assessment does not replace that evaluation, and the importance of remaining in the ED until their evaluation is complete.     Fransico Meadow, [place] 05/31/22 [id number]

## 2022-05-31 NOTE — ED Notes (Signed)
Pt refusing COVID/Flu test

## 2022-05-31 NOTE — ED Notes (Signed)
Patient refused nasal swab for Covid test.

## 2022-05-31 NOTE — ED Triage Notes (Signed)
Patient arrived with EMS from home reports central chest pain this evening with SOB , productive cough , pain increases with deep inspiration , he received ASA 324 mg and Zofran 4 mg IV by EMS prior to arrival. History of MI/CAD/coronary stents .

## 2022-06-01 ENCOUNTER — Telehealth (HOSPITAL_COMMUNITY): Payer: Self-pay | Admitting: Emergency Medicine

## 2022-06-01 ENCOUNTER — Emergency Department (HOSPITAL_COMMUNITY): Payer: Medicare Other

## 2022-06-01 ENCOUNTER — Telehealth: Payer: Self-pay | Admitting: Cardiology

## 2022-06-01 DIAGNOSIS — R059 Cough, unspecified: Secondary | ICD-10-CM | POA: Diagnosis not present

## 2022-06-01 DIAGNOSIS — R0602 Shortness of breath: Secondary | ICD-10-CM | POA: Diagnosis not present

## 2022-06-01 DIAGNOSIS — R079 Chest pain, unspecified: Secondary | ICD-10-CM | POA: Diagnosis not present

## 2022-06-01 DIAGNOSIS — R109 Unspecified abdominal pain: Secondary | ICD-10-CM | POA: Diagnosis not present

## 2022-06-01 LAB — HEPATIC FUNCTION PANEL
ALT: 15 U/L (ref 0–44)
AST: 17 U/L (ref 15–41)
Albumin: 3.5 g/dL (ref 3.5–5.0)
Alkaline Phosphatase: 71 U/L (ref 38–126)
Bilirubin, Direct: <0.1 mg/dL (ref 0.0–0.2)
Total Bilirubin: 0.6 mg/dL (ref 0.3–1.2)
Total Protein: 7.1 g/dL (ref 6.5–8.1)

## 2022-06-01 LAB — RESP PANEL BY RT-PCR (RSV, FLU A&B, COVID)  RVPGX2
Influenza A by PCR: NEGATIVE
Influenza B by PCR: NEGATIVE
Resp Syncytial Virus by PCR: NEGATIVE
SARS Coronavirus 2 by RT PCR: NEGATIVE

## 2022-06-01 LAB — LIPASE, BLOOD: Lipase: 32 U/L (ref 11–51)

## 2022-06-01 LAB — BRAIN NATRIURETIC PEPTIDE: B Natriuretic Peptide: 801.3 pg/mL — ABNORMAL HIGH (ref 0.0–100.0)

## 2022-06-01 LAB — TROPONIN I (HIGH SENSITIVITY)
Troponin I (High Sensitivity): 15 ng/L (ref <18)
Troponin I (High Sensitivity): 14 ng/L (ref <18)

## 2022-06-01 MED ORDER — OXYCODONE-ACETAMINOPHEN 5-325 MG PO TABS
1.0000 | ORAL_TABLET | Freq: Once | ORAL | Status: AC
  Administered 2022-06-01: 1 via ORAL
  Filled 2022-06-01: qty 1

## 2022-06-01 MED ORDER — IOHEXOL 350 MG/ML SOLN
75.0000 mL | Freq: Once | INTRAVENOUS | Status: AC | PRN
  Administered 2022-06-01: 75 mL via INTRAVENOUS

## 2022-06-01 MED ORDER — ONDANSETRON 4 MG PO TBDP
4.0000 mg | ORAL_TABLET | Freq: Once | ORAL | Status: AC
  Administered 2022-06-01: 4 mg via ORAL
  Filled 2022-06-01: qty 1

## 2022-06-01 MED ORDER — ONDANSETRON HCL 4 MG PO TABS: 4.0000 mg | ORAL_TABLET | Freq: Three times a day (TID) | ORAL | 0 refills | Status: DC | PRN

## 2022-06-01 MED ORDER — DOXYCYCLINE HYCLATE 100 MG PO CAPS: 100.0000 mg | ORAL_CAPSULE | Freq: Two times a day (BID) | ORAL | 0 refills | Status: AC

## 2022-06-01 MED ORDER — OXYCODONE-ACETAMINOPHEN 5-325 MG PO TABS: 1.0000 | ORAL_TABLET | ORAL | 0 refills | Status: DC | PRN

## 2022-06-01 NOTE — Telephone Encounter (Signed)
Patient will be follow up once stable medically with CHF team

## 2022-06-01 NOTE — Telephone Encounter (Signed)
Called Mr. Cockrell to follow up from recent ED visit w/ dx of Pneumonia.  LVM.  Home visit scheduled for tomorrow @ 10:00.    Renee Ramus, [place] 06/01/2022

## 2022-06-01 NOTE — ED Provider Notes (Signed)
[facility] EMERGENCY DEPARTMENT Provider Note   CSN: [id number] Arrival date & time: 05/31/22  2001     History  Chief Complaint  Patient presents with   Chest Pain    Thomas West is a 62 y.o. male.  The history is provided by the patient and medical records. No language interpreter was used.  Chest Pain Pain location:  Substernal area and L chest Pain quality: aching, pressure and tightness   Pain radiates to:  Does not radiate Pain severity:  Severe Onset quality:  Gradual Duration:  3 days Timing:  Constant Progression:  Waxing and waning Chronicity:  New Worsened by:  Coughing, deep breathing and exertion Ineffective treatments:  None tried Associated symptoms: abdominal pain (llq and inguinal pain), cough, fatigue, fever, nausea, shortness of breath and vomiting   Associated symptoms: no altered mental status, no back pain, no lower extremity edema and no palpitations   Risk factors: coronary artery disease        Home Medications Prior to Admission medications   Medication Sig Start Date End Date Taking? Authorizing Provider  aspirin EC 81 MG tablet Take 1 tablet (81 mg total) by mouth daily. Swallow whole. 12/11/21   Alen Bleacher, NP  atorvastatin (LIPITOR) 80 MG tablet Take 1 tablet (80 mg total) by mouth daily. 12/10/21 07/08/22  Alen Bleacher, NP  carvedilol (COREG) 6.25 MG tablet Take 1 tablet (6.25 mg total) by mouth EVERY 12 HOURS (10AM &10PM) 12/10/21 07/08/22  Alen Bleacher, NP  clopidogrel (PLAVIX) 75 MG tablet Take 1 tablet (75 mg total) by mouth daily. 01/20/22   Jacklynn Ganong, FNP  Continuous Blood Gluc Receiver (DEXCOM G7 RECEIVER) DEVI Use to check blood sugars 01/19/22   [name], [name], MD  cyanocobalamin 1000 MCG tablet TAKE ONE TABLET BY MOUTH DAILY 05/26/22   [name], [name], MD  empagliflozin (JARDIANCE) 25 MG TABS tablet Take 1 tablet (25 mg total) by mouth daily before breakfast. 05/25/22   [name], [name], MD  hydrALAZINE  (APRESOLINE) 50 MG tablet Take 1 tablet (50 mg total) by mouth 3 (three) times daily. 12/10/21 07/08/22  Alen Bleacher, NP  hydrocortisone cream 1 % Apply 1 Application topically 2 (two) times daily as needed for itching. 05/25/22   [name], [name], MD  isosorbide mononitrate (IMDUR) 30 MG 24 hr tablet Take 1 tablet (30 mg total) by mouth daily. 12/10/21   Alen Bleacher, NP  sacubitril-valsartan (ENTRESTO) 24-26 MG Take 1 tablet by mouth 2 (two) times daily. 05/26/22   Bensimhon, Bevelyn Buckles, MD  spironolactone (ALDACTONE) 25 MG tablet Take 1 tablet (25 mg total) by mouth daily. 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 09/02/21   Bensimhon, Bevelyn Buckles, MD  torsemide (DEMADEX) 20 MG tablet Take 1 tablet (20 mg total) by mouth every Monday, Wednesday, and Friday. 01/29/22   Jacklynn Ganong, FNP      Allergies    Bee venom    Review of Systems   Review of Systems  Constitutional:  Positive for chills, fatigue and fever.  HENT:  Positive for congestion.   Respiratory:  Positive for cough, chest tightness and shortness of breath. Negative for wheezing.   Cardiovascular:  Positive for chest pain. Negative for palpitations and leg swelling.  Gastrointestinal:  Positive for abdominal pain (llq and inguinal pain), nausea and vomiting. Negative for constipation and diarrhea.  Genitourinary:  Negative for  dysuria and flank pain.  Musculoskeletal:  Negative for back pain and neck pain.  Skin:  Negative for rash and wound.  Neurological:  Negative for light-headedness.  Psychiatric/Behavioral:  Negative for confusion.   All other systems reviewed and are negative.   Physical Exam Updated Vital Signs BP 127/80   Pulse 95   Temp 98.6 F (37 C)   Resp 16   Ht 5\' 9"  (1.753 m)   Wt 70.8 kg   SpO2 94%   BMI 23.04 kg/m  Physical Exam Vitals and nursing note reviewed.  Constitutional:      General: He is not in acute distress.    Appearance: He is  well-developed. He is not ill-appearing, toxic-appearing or diaphoretic.  HENT:     Head: Normocephalic and atraumatic.  Eyes:     Conjunctiva/sclera: Conjunctivae normal.     Pupils: Pupils are equal, round, and reactive to light.  Cardiovascular:     Rate and Rhythm: Normal rate and regular rhythm.     Heart sounds: Normal heart sounds. No murmur heard. Pulmonary:     Effort: Pulmonary effort is normal. Tachypnea present. No respiratory distress.     Breath sounds: Rhonchi and rales present.  Chest:     Chest wall: No tenderness.  Abdominal:     Palpations: Abdomen is soft.     Tenderness: There is no abdominal tenderness.  Musculoskeletal:        General: No swelling.     Cervical back: Neck supple.     Right lower leg: No tenderness. No edema.     Left lower leg: No tenderness. No edema.  Skin:    General: Skin is warm and dry.     Capillary Refill: Capillary refill takes less than 2 seconds.  Neurological:     General: No focal deficit present.     Mental Status: He is alert.  Psychiatric:        Mood and Affect: Mood normal.     ED Results / Procedures / Treatments   Labs (all labs ordered are listed, but only abnormal results are displayed) Labs Reviewed  BASIC METABOLIC PANEL - Abnormal; Notable for the following components:      Result Value   CO2 21 (*)    Glucose, Bld 152 (*)    Creatinine, Ser 1.39 (*)    Calcium 8.0 (*)    GFR, Estimated 58 (*)    All other components within normal limits  BRAIN NATRIURETIC PEPTIDE - Abnormal; Notable for the following components:   B Natriuretic Peptide 801.3 (*)    All other components within normal limits  RESP PANEL BY RT-PCR (RSV, FLU A&B, COVID)  [id number]  CBC  HEPATIC FUNCTION PANEL  LIPASE, BLOOD  TROPONIN I (HIGH SENSITIVITY)  TROPONIN I (HIGH SENSITIVITY)  TROPONIN I (HIGH SENSITIVITY)    EKG EKG Interpretation  Date/Time:  Monday May 31 2022 21:37:08 EST Ventricular Rate:  100 PR  Interval:  168 QRS Duration: 76 QT Interval:  348 QTC Calculation: 448 R Axis:   42 Text Interpretation: Normal sinus rhythm Nonspecific T wave abnormality Abnormal ECG When compared with ECG of 28-Apr-2022 12:21, PREVIOUS ECG IS PRESENT when comapred to prior, similar appeanrce with less t wave inversion depth in V5 and V6. No STEMI Confirmed by Antony Blackbird [phone]) on 06/01/2022 7:48:55 AM  Radiology DG Chest 2 View  Result Date: 05/31/2022 CLINICAL DATA:  Chest pain. EXAM: CHEST - 2 VIEW COMPARISON:  PA Lat chest 04/28/2022 FINDINGS:  The heart size and mediastinal contours are stable with mild aortic tortuosity and calcific plaques in the arch. Both lungs are clear. The visualized skeletal structures are intact. There is mild osteopenia. IMPRESSION: No active cardiopulmonary disease. Aortic atherosclerosis. Electronically Signed   By: Almira Bar M.D.   On: 05/31/2022 22:03    Procedures Procedures    Medications Ordered in ED Medications - No data to display  ED Course/ Medical Decision Making/ A&P Clinical Course as of 06/01/22 1203  Tue Jun 01, 2022  1012 Resp panel by RT-PCR (RSV, Flu A&B, Covid) Anterior Nasal Swab [AM]  1014 Troponin I (High Sensitivity) [AM]  1033 B Natriuretic Peptide(!): 801.3 [AM]  1034 Brain natriuretic peptide(!) [AM]  1046 CT Angio Chest PE W and/or Wo Contrast [AM]  1136 Hepatic function panel [AM]  1144 Hepatic function panel [AM]    Clinical Course User Index [AM] Alvira Philips, Student-PA                           Medical Decision Making Amount and/or Complexity of Data Reviewed Labs: ordered. Radiology: ordered.  Risk Prescription drug management.    Haston Casebolt is a 62 y.o. male with past medical history sniffing for CAD status post PCI, hypertension, hyperlipidemia, diabetes, CHF, previous stroke, and GERD who presents with subjective fevers, chills, productive cough, nausea, vomiting, fatigue, malaise, left-sided chest pain,  and left inguinal discomfort.  According to patient, for the last few days he has had worsening symptoms of cough and developed chest discomfort and shortness of breath.  He reports it is very pleuritic but also exertional.  Associated with nausea, vomiting, lightheadedness and fatigue.  He has had chills but did not have a fever at home however he is febrile on my initial evaluation here today.  He reports a sputum like phlegm but not report hemoptysis.  He reports some nausea involving but denies constipation diarrhea or urinary changes.  He reports he intermittently has some leg discomfort but does not have it now and there is no swelling in the legs.  He does not report to me any acute trauma or rashes.  He describes the pain is up to 10 out of 10 at times and is now more moderate.  He says this does feel somewhat like discomfort he had before PCI with MI.  Of note, patient is been there for over 11 hours in the emergency department prior to manage evaluation.  EKG does not show STEMI.  On my exam, he does have some rales and rhonchi on exam.  Chest wall is not tender and I cannot reduce discomfort.  He did not have a murmur on my auscultation.  Abdomen was nontender in the upper abdomen but he did have some tenderness in his left lower quadrant and left inguinal area.  He reports this was worsened by coughing but he denies history of hernia.  I do not feel a hernia in his inguinal area but he was having tenderness over that area.  He denies any scrotal or testicle pains.  Back and flanks nontender.  Legs nontender nonedematous.  Patient was not hypoxic with oxygen saturations in the low 90s but he was not tachypneic or tachycardic on initial evaluation.  Intact pulses in extremities.  Given patient's report that he does have pain with a crushing left sided discomfort with nausea vomiting and shortness of breath that feels similar to prior MI, he will have workup to rule  out a cardiac cause as well as  other etiologies.  Given the ongoing pandemic and viral illness in the community, will check for viral swabs.  His chest x-ray in triage was reassuring and his EKG appeared similar to prior.  Initial 2 troponins were negative, will get a third since he is still having discomfort.  With this pleuritic discomfort intermittent leg symptoms, I do feel further imaging is needed to rule out pulm embolism or an occult pneumonia missed on x-ray with his productive cough.  Will get a CT PE study.  Due to the left inguinal pain and left groin pain, we will get a abdomen pelvis CT as well to rule out intermittent inguinal hernia.  Patient will have a BNP given his shortness of breath and heart failure history and will continue to monitor patient.  Anticipate reassessment after workup to determine disposition.  If his workup is otherwise reassuring, may potentially discuss with cardiology given his reports that his pain feels similar to previous MI pain.  Anticipate reassessment.     12:07 PM Workup continues to return.  CT scan did not show evidence of pulm embolism but did show some evidence of bronchitis but no large pneumonia.  Clinically with the patient productive cough, respiratory sounds auscultation, and his vital signs I am still concerned there is an early pneumonia developing.  Anticipate giving him antibiotics.  He is not hypoxic but is still having the chest pain.  As he reports that this chest pain feels similar to what he had when he had his MI and needed stenting, we will discuss with cardiology.  He we did get a third troponin that was reassuring.  His BNP is more elevated than prior so we will discuss this with him as well.  CT also did show evidence of a left inguinal hernia.  I was able to reassess and felt it reduced.  He reports it is still hurting and I feel like it is popping in and out.  Will also discuss with general surgery an outpatient plan most likely.  12:18 PM Spoke with general  surgery who does not think he needs acute intervention on this hernia at this time.  As I feel we could reduce the hernia, patient is likely stable for discharge overnight standpoint.  We will give him instructions on holding over when he is coughing and self reduction however if it bulges out has uncontrolled pain and is not reducing he will need to come back to emergency department.  Will await cardiology discussion and anticipate discharge home with antibiotics.  12:35 PM Spoke to Dr. Jacinto Halim with cardiology who reviewed the case EKG and labs.  He feels patient is also safe for discharge home and he will send a message to his heart failure team.  They will help arrange outpatient follow-up and agree with treating with antibiotics for this possible early pneumonia/bronchitis.  They agree with outpatient general surgery follow-up for the new hernia and close follow-up and return precautions.  Patient be discharged shortly.  Spoke with patient agrees with plan.  Will send prescription for pain medicine, nausea medicine, antibiotics to his pharmacy of choice.  He understood return precautions and follow-up with the general surgery and cardiology.  Patient discharged in good condition with your symptoms.         Final Clinical Impression(s) / ED Diagnoses Final diagnoses:  Bronchitis  Non-recurrent unilateral inguinal hernia without obstruction or gangrene  Acute cough  Atypical chest pain  Rx / DC Orders ED Discharge Orders          Ordered    doxycycline (VIBRAMYCIN) 100 MG capsule  2 times daily        06/01/22 1311    oxyCODONE-acetaminophen (PERCOCET/ROXICET) 5-325 MG tablet  Every 4 hours PRN        06/01/22 1311    ondansetron (ZOFRAN) 4 MG tablet  Every 8 hours PRN        06/01/22 1311           Clinical Impression: 1. Bronchitis   2. Non-recurrent unilateral inguinal hernia without obstruction or gangrene   3. Acute cough   4. Atypical chest pain      Disposition: Discharge  Condition: Good  I have discussed the results, Dx and Tx plan with the pt(& family if present). He/she/they expressed understanding and agree(s) with the plan. Discharge instructions discussed at great length. Strict return precautions discussed and pt &/or family have verbalized understanding of the instructions. No further questions at time of discharge.    New Prescriptions   DOXYCYCLINE (VIBRAMYCIN) 100 MG CAPSULE    Take 1 capsule (100 mg total) by mouth 2 (two) times daily for 7 days.   ONDANSETRON (ZOFRAN) 4 MG TABLET    Take 1 tablet (4 mg total) by mouth every 8 (eight) hours as needed for nausea or vomiting.   OXYCODONE-ACETAMINOPHEN (PERCOCET/ROXICET) 5-325 MG TABLET    Take 1 tablet by mouth every 4 (four) hours as needed for severe pain.    Follow Up: Surgery, [facility] [address] [place] [address] [phone]  Schedule an appointment as soon as possible for a visit in 2 week(s) Follow up for your inguinal hernia after resolution of your Bronchitis/pnuemonia  [name], [name], MD [address] [place] [address] [phone]     your cardiologist     [facility] EMERGENCY DEPARTMENT [address] [id number] mc [place] [address] [phone]       Nikelle Malatesta, Canary Brim, MD 06/01/22 1313

## 2022-06-01 NOTE — Discharge Instructions (Signed)
Your history, exam, workup today revealed evidence of bronchitis versus early pneumonia however you are negative for COVID, flu, and RSV.  The CT scan did not show blood clots or collapsed lung.  The CT of her abdomen pelvis did show a small hernia in the left groin which was likely due to coughing.  Please be careful to help cover the area when you are coughing and try to self reduce the hernia.  Please follow-up with general surgery whom we spoke with and they feel you are safe for discharge home.  Please take the pain medicine, nausea medicine to help with symptoms and maintain hydration and then take the antibiotics for the respiratory infection.  Please follow-up with cardiology.  Your cardiac enzymes were negative and all 3 times we checked them and your heart failure test was slightly more elevated.  Please follow-up with them.  If any symptoms change or worsen acutely, please return to the nearest emergency department.

## 2022-06-02 ENCOUNTER — Telehealth (HOSPITAL_COMMUNITY): Payer: Self-pay | Admitting: Emergency Medicine

## 2022-06-02 ENCOUNTER — Other Ambulatory Visit (HOSPITAL_COMMUNITY): Payer: Self-pay

## 2022-06-02 ENCOUNTER — Other Ambulatory Visit (HOSPITAL_COMMUNITY): Payer: Self-pay | Admitting: Emergency Medicine

## 2022-06-02 NOTE — Progress Notes (Signed)
Paramedicine Encounter    Patient ID: Thomas West, male    DOB: 12/27/1960, 62 y.o.   MRN: [id number]   BP 120/80 (BP Location: Left Arm, Patient Position: Sitting, Cuff Size: Normal)   Pulse 86   Wt 159 lb 3.2 oz (72.2 kg)   SpO2 95%   BMI 23.51 kg/m  Weight yesterday-not taken Last visit weight-158lb  Home visit with Thomas West today finds him A&O x 4, skin W&D w/ good color.  Pt recently hospitalized and diagnosed with Bronchitis and they also found he has an inguinal hernia which they suspect came from his severe coughing.  Pt denies chest pain or any new SOB.  Lung sounds clear bilat.  No edema noted.  He missed 1- Carvedilol and 1 Entresto out of his weeks worth of meds and 3 noon Hydralazine doses.    He says his son gives him his meds in the morning and must have missed getting all the pills out of the pill box.  Advised pt that he is the adult and ultimately responsible for taking his medications appropriately.  Med box reconciled x 1 week.  No refills needed at this time. Home visit complete.    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   Pruritic rash 05/25/2022   Polyneuropathy associated with underlying disease (HCC) 01/15/2022   Atherosclerosis of aorta (HCC) 01/15/2022   History of CVA (cerebrovascular accident) without residual deficits 12/24/2021   AKI (acute kidney injury) (HCC) 12/13/2021   Diarrhea 12/13/2021   Shortness of breath 12/13/2021   Chest pain 12/13/2021   Nausea and vomiting 12/13/2021   Heart failure (HCC) 12/08/2021   Hypertension associated with diabetes (HCC) 07/12/2021   Chronic kidney disease, stage 3a (HCC) 07/12/2021   Hyperkalemia 07/12/2021   CHF exacerbation (HCC) 04/20/2021   Acute on chronic  combined systolic and diastolic CHF (congestive heart failure) (HCC) 03/09/2021   Acute on chronic HFrEF (heart failure with reduced ejection fraction) (HCC) 03/08/2021   HFrEF (heart failure with reduced ejection fraction) (HCC) 02/03/2021   Diabetes mellitus (HCC) 03/27/2019   Hyperlipidemia associated with type 2 diabetes mellitus (HCC) 03/27/2019   CAD (coronary artery disease) 03/05/2019   History of MI (myocardial infarction) 03/05/2019   Type 2 diabetes mellitus with diabetic neuropathy, unspecified (HCC) 03/05/2019   HLD (hyperlipidemia) 03/05/2019   GERD (gastroesophageal reflux disease) 03/05/2019   Hypertension, essential, benign 03/05/2019    Current Outpatient Medications:    aspirin EC 81 MG tablet, Take 1 tablet (81 mg total) by mouth daily. Swallow whole., Disp: 30 tablet, Rfl: 12   atorvastatin (LIPITOR) 80 MG tablet, Take 1 tablet (80 mg total) by mouth daily., Disp: 30 tablet, Rfl: 6   carvedilol (COREG) 6.25 MG tablet, Take 1 tablet (6.25 mg total) by mouth EVERY 12 HOURS (10AM &10PM), Disp: 60 tablet, Rfl: 6   clopidogrel (PLAVIX) 75 MG tablet, Take 1 tablet (75 mg total) by mouth daily., Disp: 30 tablet, Rfl: 3   Continuous Blood Gluc Receiver (DEXCOM G7 RECEIVER) DEVI, Use to check blood sugars, Disp: 3 each, Rfl: 3   cyanocobalamin 1000 MCG tablet, TAKE ONE TABLET BY MOUTH DAILY, Disp: 30 tablet, Rfl: 2   doxycycline (VIBRAMYCIN) 100 MG capsule, Take 1 capsule (100 mg total) by mouth  2 (two) times daily for 7 days., Disp: 14 capsule, Rfl: 0   empagliflozin (JARDIANCE) 25 MG TABS tablet, Take 1 tablet (25 mg total) by mouth daily before breakfast., Disp: 90 tablet, Rfl: 1   hydrALAZINE (APRESOLINE) 50 MG tablet, Take 1 tablet (50 mg total) by mouth 3 (three) times daily., Disp: 90 tablet, Rfl: 6   hydrocortisone cream 1 %, Apply 1 Application topically 2 (two) times daily as needed for itching., Disp: 45 g, Rfl: 1   isosorbide mononitrate (IMDUR) 30 MG 24 hr tablet,  Take 1 tablet (30 mg total) by mouth daily., Disp: 30 tablet, Rfl: 6   ondansetron (ZOFRAN) 4 MG tablet, Take 1 tablet (4 mg total) by mouth every 8 (eight) hours as needed for nausea or vomiting., Disp: 12 tablet, Rfl: 0   oxyCODONE-acetaminophen (PERCOCET/ROXICET) 5-325 MG tablet, Take 1 tablet by mouth every 4 (four) hours as needed for severe pain., Disp: 15 tablet, Rfl: 0   sacubitril-valsartan (ENTRESTO) 24-26 MG, Take 1 tablet by mouth 2 (two) times daily., Disp: 60 tablet, Rfl: 3   spironolactone (ALDACTONE) 25 MG tablet, Take 1 tablet (25 mg total) by mouth daily., Disp: 30 tablet, Rfl: 11   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, Take 1 capsule by mouth daily., Disp: 30 capsule, Rfl: 11   torsemide (DEMADEX) 20 MG tablet, Take 1 tablet (20 mg total) by mouth every Monday, Wednesday, and Friday., Disp: 30 tablet, Rfl: 3 Allergies  Allergen Reactions   Bee Venom Anaphylaxis, Swelling and Other (See Comments)    Swells all over      Social History   Socioeconomic History   Marital status: Single    Spouse name: Not on file   Number of children: Not on file   Years of education: Not on file   Highest education level: Not on file  Occupational History   Not on file  Tobacco Use   Smoking status: Former    Types: Cigarettes    Quit date: 10/06/2017    Years since quitting: 4.6   Smokeless tobacco: Never  Substance and Sexual Activity   Alcohol use: Not Currently   Drug use: Yes    Types: Marijuana   Sexual activity: Not on file  Other Topics Concern   Not on file  Social History Narrative   Not on file   Social Determinants of Health   Financial Resource Strain: Low Risk  (01/18/2022)   Overall Financial Resource Strain (CARDIA)    Difficulty of Paying Living Expenses: Not hard at all  Food Insecurity: No Food Insecurity (01/18/2022)   Hunger Vital Sign    Worried About Running Out of Food in the Last Year: Never true    Ran Out of Food in the Last Year: Never true  Recent Concern:  Food Insecurity - Food Insecurity Present (12/07/2021)   Hunger Vital Sign    Worried About Running Out of Food in the Last Year: Sometimes true    Ran Out of Food in the Last Year: Never true  Transportation Needs: No Transportation Needs (05/03/2022)   PRAPARE - Hydrologist (Medical): No    Lack of Transportation (Non-Medical): No  Physical Activity: Inactive (01/18/2022)   Exercise Vital Sign    Days of Exercise per Week: 0 days    Minutes of Exercise per Session: 0 min  Stress: Stress Concern Present (01/18/2022)   [place]    Feeling of Stress :  To some extent  Social Connections: Socially Isolated (01/18/2022)   Social Connection and Isolation Panel [NHANES]    Frequency of Communication with Friends and Family: More than three times a week    Frequency of Social Gatherings with Friends and Family: More than three times a week    Attends Religious Services: Never    Marine scientist or Organizations: No    Attends Archivist Meetings: Never    Marital Status: Never married  Intimate Partner Violence: Not At Risk (01/18/2022)   Humiliation, Afraid, Rape, and Kick questionnaire    Fear of Current or Ex-Partner: No    Emotionally Abused: No    Physically Abused: No    Sexually Abused: No    Physical Exam      Future Appointments  Date Time Provider [place]  06/14/2022 12:50 PM Alda Berthold, DO [facility] None  06/29/2022  9:15 AM [place], Gerarda Gunther, RD [place] NDM  09/24/2022 11:00 AM [name], [name], MD [facility] [place], [place] [facility] Paramedic  06/02/22

## 2022-06-02 NOTE — Telephone Encounter (Signed)
Called and spoke to Thomas West to remind him of our morning appointment and let him know that I would go by [facility] and pick up his new prescriptions from his recent discharge from ED.    Renee Ramus, [place] 06/02/2022

## 2022-06-04 ENCOUNTER — Other Ambulatory Visit (HOSPITAL_COMMUNITY): Payer: Self-pay

## 2022-06-04 ENCOUNTER — Telehealth: Payer: Self-pay

## 2022-06-04 NOTE — Patient Outreach (Signed)
  Care Coordination TOC Note Transition Care Management Follow-up Telephone Call  Date of discharge and from where: 06/01/22-[place]   Dx: "bronchitis" Red on EMMI-ED Discharge Alert Reason: "Scheduled follow-up appt? No" Red Alert Date: 06/03/22 How have you been since you were released from the hospital? Patient reports he is doing and feeling better since ED visit. Denies any resp issues-states he feels like all sxs have resolved and he is back baseline.  Any questions or concerns? No  Items Reviewed: Did the pt receive and understand the discharge instructions provided? Yes  Medications obtained and verified?  Patient did not wish to complete med review-[facility] was out to see him-delivered meds and went over meds with him Other? No  Any new allergies since your discharge? No  Dietary orders reviewed? Yes Do you have support at home? Yes   Home Care and Equipment/Supplies: Were home health services ordered? not applicable If so, what is the name of the agency? N/A  Has the agency set up a time to come to the patient's home? not applicable Were any new equipment or medical supplies ordered?  No What is the name of the medical supply agency? N/A Were you able to get the supplies/equipment? not applicable Do you have any questions related to the use of the equipment or supplies? No  Functional Questionnaire: (I = Independent and D = Dependent) ADLs: I  Bathing/Dressing- I  Meal Prep- I  Eating- I  Maintaining continence- I  Transferring/Ambulation- I  Managing Meds- A  Follow up appointments reviewed:  PCP Hospital f/u appt confirmed? No   Specialist Hospital f/u appt confirmed? No   Are transportation arrangements needed? No  If their condition worsens, is the pt aware to call PCP or go to the Emergency Dept.? Yes Was the patient provided with contact information for the PCP's office or ED? Yes Was to pt encouraged to call back with questions or concerns?  Yes  SDOH assessments and interventions completed:   Yes SDOH Interventions Today    Flowsheet Row Most Recent Value  SDOH Interventions   Food Insecurity Interventions Intervention Not Indicated  Transportation Interventions Intervention Not Indicated       Care Coordination Interventions:  Education provided Message sent to assigned EMT per patient request notifying of assistance during next home visit to schedule appts  Encounter Outcome:  Pt. Visit Completed     Enzo Montgomery, RN,BSN,CCM [place] [facility] Telephonic Care Management Coordinator Direct Phone: [phone] Toll Free: [phone] Fax: [phone]

## 2022-06-07 NOTE — Telephone Encounter (Signed)
Pt contacted and appt scheduled

## 2022-06-09 ENCOUNTER — Other Ambulatory Visit (HOSPITAL_COMMUNITY): Payer: Self-pay | Admitting: Emergency Medicine

## 2022-06-09 NOTE — Progress Notes (Signed)
Paramedicine Encounter    Patient ID: Thomas West, male    DOB: 06/29/1960, 62 y.o.   MRN: [id number]   Complaints NONE  Assessment A&O x 4.  No chest pain, SOB, no edema  Compliance with meds missed 1 a.m. dose and 2 pm doses  Pill box filled x 1 week  Refills needed NONE  Meds changes since last visit NONE    Social changes NONE   There were no vitals taken for this visit. Weight yesterday-NOT TAKEN Last visit weight-159 lb  [name] A&O x 4, skin W&D w/ good color.  Pt. Has no complaints today.  He missed 1 a.m. dose and 2p.m. doses and did not take any noon Hydralazine doses.  Lung sounds clear and equal bilat.  No edema noted.  Med box reconciled x 1 week.  No refills needed. Reminded him of appointment w/ Dr. Allena Katz 1/22 @ 12:50   ACTION: Home visit completed  [name] [phone] 06/09/22  Patient Care Team: [name], [name], MD as PCP - General (Family Medicine) Gala Romney Bevelyn Buckles, MD as PCP - Cardiology (Cardiology)  Patient Active Problem List   Diagnosis Date Noted   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   Pruritic rash 05/25/2022   Polyneuropathy associated with underlying disease (HCC) 01/15/2022   Atherosclerosis of aorta (HCC) 01/15/2022   History of CVA (cerebrovascular accident) without residual deficits 12/24/2021   AKI (acute kidney injury) (HCC) 12/13/2021   Diarrhea 12/13/2021   Shortness of breath 12/13/2021   Chest pain 12/13/2021   Nausea and vomiting 12/13/2021   Heart failure (HCC) 12/08/2021   Hypertension associated with diabetes (HCC) 07/12/2021   Chronic kidney disease, stage 3a (HCC) 07/12/2021   Hyperkalemia 07/12/2021   CHF exacerbation (HCC) 04/20/2021   Acute on chronic combined systolic and diastolic CHF (congestive heart failure) (HCC) 03/09/2021   Acute on chronic HFrEF (heart failure with reduced ejection fraction) (HCC) 03/08/2021   HFrEF (heart failure with reduced ejection fraction) (HCC)  02/03/2021   Diabetes mellitus (HCC) 03/27/2019   Hyperlipidemia associated with type 2 diabetes mellitus (HCC) 03/27/2019   CAD (coronary artery disease) 03/05/2019   History of MI (myocardial infarction) 03/05/2019   Type 2 diabetes mellitus with diabetic neuropathy, unspecified (HCC) 03/05/2019   HLD (hyperlipidemia) 03/05/2019   GERD (gastroesophageal reflux disease) 03/05/2019   Hypertension, essential, benign 03/05/2019    Current Outpatient Medications:    aspirin EC 81 MG tablet, Take 1 tablet (81 mg total) by mouth daily. Swallow whole., Disp: 30 tablet, Rfl: 12   atorvastatin (LIPITOR) 80 MG tablet, Take 1 tablet (80 mg total) by mouth daily., Disp: 30 tablet, Rfl: 6   carvedilol (COREG) 6.25 MG tablet, Take 1 tablet (6.25 mg total) by mouth EVERY 12 HOURS (10AM &10PM), Disp: 60 tablet, Rfl: 6   clopidogrel (PLAVIX) 75 MG tablet, Take 1 tablet (75 mg total) by mouth daily., Disp: 30 tablet, Rfl: 3   Continuous Blood Gluc Receiver (DEXCOM G7 RECEIVER) DEVI, Use to check blood sugars, Disp: 3 each, Rfl: 3   cyanocobalamin 1000 MCG tablet, TAKE ONE TABLET BY MOUTH DAILY, Disp: 30 tablet, Rfl: 2   empagliflozin (JARDIANCE) 25 MG TABS tablet, Take 1 tablet (25 mg total) by mouth daily before breakfast., Disp: 90 tablet, Rfl: 1   hydrALAZINE (APRESOLINE) 50 MG tablet, Take 1 tablet (50 mg total) by mouth 3 (three) times daily., Disp: 90 tablet, Rfl: 6   hydrocortisone cream 1 %, Apply 1 Application topically 2 (two) times  daily as needed for itching., Disp: 45 g, Rfl: 1   isosorbide mononitrate (IMDUR) 30 MG 24 hr tablet, Take 1 tablet (30 mg total) by mouth daily., Disp: 30 tablet, Rfl: 6   ondansetron (ZOFRAN) 4 MG tablet, Take 1 tablet (4 mg total) by mouth every 8 (eight) hours as needed for nausea or vomiting., Disp: 12 tablet, Rfl: 0   oxyCODONE-acetaminophen (PERCOCET/ROXICET) 5-325 MG tablet, Take 1 tablet by mouth every 4 (four) hours as needed for severe pain., Disp: 15 tablet,  Rfl: 0   sacubitril-valsartan (ENTRESTO) 24-26 MG, Take 1 tablet by mouth 2 (two) times daily., Disp: 60 tablet, Rfl: 3   spironolactone (ALDACTONE) 25 MG tablet, Take 1 tablet (25 mg total) by mouth daily., Disp: 30 tablet, Rfl: 11   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, Take 1 capsule by mouth daily., Disp: 30 capsule, Rfl: 11   torsemide (DEMADEX) 20 MG tablet, Take 1 tablet (20 mg total) by mouth every Monday, Wednesday, and Friday., Disp: 30 tablet, Rfl: 3 Allergies  Allergen Reactions   Bee Venom Anaphylaxis, Swelling and Other (See Comments)    Swells all over     Social History   Socioeconomic History   Marital status: Single    Spouse name: Not on file   Number of children: Not on file   Years of education: Not on file   Highest education level: Not on file  Occupational History   Not on file  Tobacco Use   Smoking status: Former    Types: Cigarettes    Quit date: 10/06/2017    Years since quitting: 4.6   Smokeless tobacco: Never  Substance and Sexual Activity   Alcohol use: Not Currently   Drug use: Yes    Types: Marijuana   Sexual activity: Not on file  Other Topics Concern   Not on file  Social History Narrative   Not on file   Social Determinants of Health   Financial Resource Strain: Low Risk  (01/18/2022)   Overall Financial Resource Strain (CARDIA)    Difficulty of Paying Living Expenses: Not hard at all  Food Insecurity: No Food Insecurity (06/04/2022)   Hunger Vital Sign    Worried About Running Out of Food in the Last Year: Never true    Ran Out of Food in the Last Year: Never true  Transportation Needs: No Transportation Needs (06/04/2022)   PRAPARE - Hydrologist (Medical): No    Lack of Transportation (Non-Medical): No  Physical Activity: Inactive (01/18/2022)   Exercise Vital Sign    Days of Exercise per Week: 0 days    Minutes of Exercise per Session: 0 min  Stress: Stress Concern Present (01/18/2022)   [place]    Feeling of Stress : To some extent  Social Connections: Socially Isolated (01/18/2022)   Social Connection and Isolation Panel [NHANES]    Frequency of Communication with Friends and Family: More than three times a week    Frequency of Social Gatherings with Friends and Family: More than three times a week    Attends Religious Services: Never    Marine scientist or Organizations: No    Attends Archivist Meetings: Never    Marital Status: Never married  Intimate Partner Violence: Not At Risk (01/18/2022)   Humiliation, Afraid, Rape, and Kick questionnaire    Fear of Current or Ex-Partner: No    Emotionally Abused: No  Physically Abused: No    Sexually Abused: No    Physical Exam      Future Appointments  Date Time Provider [place]  06/14/2022 12:50 PM Alda Berthold, DO [facility] None  06/22/2022 10:30 AM [facility] PA/NP [facility] None  06/29/2022  9:15 AM [place], Gerarda Gunther, RD [facility] NDM  09/24/2022 11:00 AM [name], [name], MD [facility]

## 2022-06-10 ENCOUNTER — Other Ambulatory Visit (HOSPITAL_COMMUNITY): Payer: Self-pay

## 2022-06-10 NOTE — Telephone Encounter (Signed)
Advanced Heart Failure Patient Advocate Encounter  Prior Authorization for Thomas West has been approved.    PA# [id number] Effective through 05/24/23  Charlann Boxer, CPhT

## 2022-06-14 ENCOUNTER — Other Ambulatory Visit (HOSPITAL_COMMUNITY): Payer: Self-pay

## 2022-06-14 ENCOUNTER — Encounter: Payer: Self-pay | Admitting: Neurology

## 2022-06-14 ENCOUNTER — Ambulatory Visit (INDEPENDENT_AMBULATORY_CARE_PROVIDER_SITE_OTHER): Payer: Medicare Other | Admitting: Neurology

## 2022-06-14 VITALS — BP 112/72 | HR 77 | Ht 69.0 in | Wt 157.0 lb

## 2022-06-14 DIAGNOSIS — E851 Neuropathic heredofamilial amyloidosis: Secondary | ICD-10-CM

## 2022-06-14 DIAGNOSIS — G63 Polyneuropathy in diseases classified elsewhere: Secondary | ICD-10-CM | POA: Diagnosis not present

## 2022-06-14 MED ORDER — DULOXETINE HCL 30 MG PO CPEP
30.0000 mg | ORAL_CAPSULE | Freq: Every day | ORAL | 3 refills | Status: DC
  Filled 2022-06-14 – 2022-06-25 (×2): qty 30, 30d supply, fill #0
  Filled 2022-07-20: qty 30, 30d supply, fill #1
  Filled 2022-08-24: qty 30, 30d supply, fill #2

## 2022-06-14 NOTE — Progress Notes (Signed)
[facility] Neurology [facility] Clinic Note - Initial Visit   Date: 06/14/2022   Thomas West MRN: [id number] DOB: 1961/02/24   Dear Dr. [name]:  Thank you for your kind referral of Thomas West for consultation of neuropathy. Although his history is well known to you, please allow [place]  reiterate it for the purpose of our medical record. The patient was accompanied to the clinic by self.   Thomas West is a 62 y.o. right-handed male with CHF, hypertension, CKD, history of stroke, diabetes mellitus, and hereditary transthyretin amyloidosis presenting for evaluation of neuropathy.   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  NCS/EMG declined. Unfortunately, current therapies for neuropathy due to transthyrettin amyloidosis (Onpattro, Amvuttra) are not approved for wild-type TTR so management remains supportive.   - Previously tried:  gabapentin (no benefit)  - Start Cymbalta 30mg  daily  - Start PT for balance training  - Patient educated on daily foot inspection, fall prevention, and safety precautions around the home.  Return to clinic in 4 months  ------------------------------------------------------------- History of present illness: He did was diagnosed with heart failure in 2022 during hospitalization for CHF exacerbation.  Cardiac MRI was suggestive of amyloidosis and PYP scan concerning for transthyretin amyloidosis.  Ultimately genetic testing returned negative and he was diagnosed with wild-type TTR amyloidosis.  He is taking tafamidis.     Starting 2022, he began to have numbness/tingling in the feet which has progressed to the lower legs.  Symptoms are constant and worse at night, often keeping him awake.  Prolonged standing makes it worse.  He has tried gabapentin which did not help.  Balance is fair.  He walks unassisted at home, but  uses a walker for long distances. He had a fall in 2023 and injured his back.  No falls since this time.  He has tried gabapentin without any benefit.   He lives at home with his 47 year old son.  He moved from 20 to  in 2021.    Out-side paper records, electronic medical record, and images have been reviewed where available and summarized as:  CT lumbar spine wo contrast 12/08/2021: 1. No acute fracture or static subluxation of the lumbar spine. 2. Aortic Atherosclerosis (ICD10-I70.0).  Lab Results  Component Value Date   HGBA1C 7.5 (A) 05/25/2022   Lab Results  Component Value Date   VITAMINB12 408 08/18/2021   Lab Results  Component Value Date   TSH 0.782 04/21/2021   No results found for: "ESRSEDRATE", "POCTSEDRATE"  Past Medical History:  Diagnosis Date   CHF (congestive heart failure) (HCC)    Coronary artery disease    Diabetes mellitus without complication (HCC)    History of MI (myocardial infarction) 03/05/2019   Hypertension    Stroke [facility])     Past Surgical History:  Procedure Laterality Date   leg surgery     "pins in left shin"   RIGHT/LEFT HEART CATH AND CORONARY ANGIOGRAPHY N/A 03/11/2021   Procedure: RIGHT/LEFT HEART CATH AND CORONARY ANGIOGRAPHY;  Surgeon: 03/13/2021, MD;  Location: [facility] INVASIVE CV LAB;  Service: Cardiovascular;  Laterality: N/A;     Medications:  Outpatient Encounter Medications as of 06/14/2022  Medication Sig   aspirin EC 81 MG tablet Take 1 tablet (81 mg total) by mouth daily. Swallow whole.   atorvastatin (LIPITOR) 80 MG tablet Take 1 tablet (80 mg total) by mouth daily.   carvedilol (COREG) 6.25 MG tablet Take  1 tablet (6.25 mg total) by mouth EVERY 12 HOURS (10AM &10PM)   clopidogrel (PLAVIX) 75 MG tablet Take 1 tablet (75 mg total) by mouth daily.   Continuous Blood Gluc Receiver (DEXCOM G7 RECEIVER) DEVI Use to check blood sugars   cyanocobalamin (VITAMIN B12) 1000 MCG tablet Take 1,000 mcg by mouth daily.    empagliflozin (JARDIANCE) 25 MG TABS tablet Take 1 tablet (25 mg total) by mouth daily before breakfast.   hydrALAZINE (APRESOLINE) 50 MG tablet Take 1 tablet (50 mg total) by mouth 3 (three) times daily.   hydrocortisone cream 1 % Apply 1 Application topically 2 (two) times daily as needed for itching.   isosorbide mononitrate (IMDUR) 30 MG 24 hr tablet Take 1 tablet (30 mg total) by mouth daily.   oxyCODONE-acetaminophen (PERCOCET/ROXICET) 5-325 MG tablet Take 1 tablet by mouth every 4 (four) hours as needed for severe pain.   sacubitril-valsartan (ENTRESTO) 24-26 MG Take 1 tablet by mouth 2 (two) times daily.   spironolactone (ALDACTONE) 25 MG tablet Take 1 tablet (25 mg total) by mouth daily.   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   torsemide (DEMADEX) 20 MG tablet Take 1 tablet (20 mg total) by mouth every Monday, Wednesday, and Friday.   cyanocobalamin 1000 MCG tablet TAKE ONE TABLET BY MOUTH DAILY (Patient not taking: Reported on 06/14/2022)   ondansetron (ZOFRAN) 4 MG tablet Take 1 tablet (4 mg total) by mouth every 8 (eight) hours as needed for nausea or vomiting. (Patient not taking: Reported on 06/14/2022)   No facility-administered encounter medications on file as of 06/14/2022.    Allergies:  Allergies  Allergen Reactions   Bee Venom Anaphylaxis, Swelling and Other (See Comments)    Swells all over    Family History: Family History  Problem Relation Age of Onset   Diabetes Mother    Kidney failure Mother     Social History: Social History   Tobacco Use   Smoking status: Former    Types: Cigarettes    Quit date: 10/06/2017    Years since quitting: 4.6   Smokeless tobacco: Never  Substance Use Topics   Alcohol use: Yes    Comment: Occasional Beer   Drug use: Yes    Types: Marijuana   Social History   Social History Narrative   Right Handed.    Lives in a two story home    Lives with 69 year old son.     Vital Signs:  BP 112/72   Pulse 77    Ht 5\' 9"  (1.753 m)   Wt 157 lb (71.2 kg)   SpO2 99%   BMI 23.18 kg/m    Neurological Exam: MENTAL STATUS including orientation to time, place, person, recent and remote memory, attention span and concentration, language, and fund of knowledge is normal.  Speech is not dysarthric.  CRANIAL NERVES: II:  No visual field defects.     III-IV-VI: Pupils equal round and reactive to light.  Normal conjugate, extra-ocular eye movements in all directions of gaze.  No nystagmus.  No ptosis.   V:  Normal facial sensation.    VII:  Normal facial symmetry and movements.   VIII:  Normal hearing and vestibular function.   IX-X:  Normal palatal movement.   XI:  Normal shoulder shrug and head rotation.   XII:  Normal tongue strength and range of motion, no deviation or fasciculation.  MOTOR:  No atrophy, fasciculations or abnormal movements.  No pronator drift.  Upper Extremity:  Right  Left  Deltoid  5/5   5/5   Biceps  5/5   5/5   Triceps  5/5   5/5   Wrist extensors  5/5   5/5   Wrist flexors  5/5   5/5   Finger extensors  5/5   5/5   Finger flexors  5/5   5/5   Dorsal interossei  5/5   5/5   Abductor pollicis  5/5   5/5   Tone (Ashworth scale)  0  0   Lower Extremity:  Right  Left  Hip flexors  5/5   5/5   Knee flexors  5/5   5/5   Knee extensors  5/5   5/5   Dorsiflexors  5/5   5/5   Plantarflexors  5/5   5/5   Toe extensors  5/5   5/5   Toe flexors  5/5   5/5   Tone (Ashworth scale)  0  0   MSRs:                                           Right        Left brachioradialis 2+  2+  biceps 2+  2+  triceps 2+  2+  patellar 1+  1+  ankle jerk 0  0  Hoffman no  no  plantar response down  down   SENSORY:  Reduced vibration below the ankles, temperature and pin prick reduced from the mid-calf into the feet.  Romberg's sign is present.   COORDINATION/GAIT: Normal finger-to- nose-finger.  Intact rapid alternating movements bilaterally.  Gait is wide-based, slow, unassisted.     Thank you for allowing me to participate in patient's care.  If I can answer any additional questions, I would be pleased to do so.    Sincerely,    Sybilla Malhotra K. Posey Pronto, DO

## 2022-06-14 NOTE — Patient Instructions (Addendum)
Start physical therapy for balance training  Start Cymbalta 30mg  daily for feet pain  Return to clinic 4 month

## 2022-06-15 ENCOUNTER — Other Ambulatory Visit (HOSPITAL_COMMUNITY): Payer: Self-pay

## 2022-06-16 ENCOUNTER — Other Ambulatory Visit (HOSPITAL_COMMUNITY): Payer: Self-pay

## 2022-06-16 ENCOUNTER — Other Ambulatory Visit: Payer: Self-pay

## 2022-06-16 ENCOUNTER — Other Ambulatory Visit (HOSPITAL_COMMUNITY): Payer: Self-pay | Admitting: Emergency Medicine

## 2022-06-16 NOTE — Progress Notes (Signed)
Paramedicine Encounter    Patient ID: Thomas West, male    DOB: [birth year], 62 y.o.   MRN: [id number]   Complaints no complaints  Assessment A&O x 4, No chest pain or SOB. Lung sounds clear and no edema noted.  Compliance with meds: Has been compliant w/ all meds except noon dose of Hydralazine  Pill box filled Med box reconciled x 1 week  Refills needed none  Meds changes since last visit Cymbalta added by Neurologist   Social changes None   BP 126/80 (BP Location: Left Arm, Patient Position: Sitting)   Pulse 78   Resp 14   Wt 157 lb 3.2 oz (71.3 kg)   SpO2 95%   BMI 23.21 kg/m  Weight yesterday-not taken Last visit weight-157lb  Reviewed medications with patient to review what they are used for and how they work to improve heart function.  Called [facility] to f/u on Cymbalta prescription from his neurologist.  This will be mailed out and he should receive it by Thurs or Fri.  Advised pt to add 1 pill daily in the a.m. He advises he understand same.  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[facility]) 05/25/2022   Pruritic rash 05/25/2022   Polyneuropathy associated with underlying disease (HCC) 01/15/2022   Atherosclerosis of aorta (HCC) 01/15/2022   History of CVA (cerebrovascular accident) without residual deficits 12/24/2021   AKI (acute kidney injury) (HCC) 12/13/2021   Diarrhea 12/13/2021   Shortness of breath 12/13/2021   Chest pain 12/13/2021   Nausea and vomiting 12/13/2021   Heart failure (HCC) 12/08/2021   Hypertension associated with diabetes (HCC) 07/12/2021   Chronic kidney disease, stage 3a (HCC) 07/12/2021   Hyperkalemia 07/12/2021   CHF  exacerbation (HCC) 04/20/2021   Acute on chronic combined systolic and diastolic CHF (congestive heart failure) (HCC) 03/09/2021   Acute on chronic HFrEF (heart failure with reduced ejection fraction) (HCC) 03/08/2021   HFrEF (heart failure with reduced ejection fraction) (HCC) 02/03/2021   Diabetes mellitus (HCC) 03/27/2019   Hyperlipidemia associated with type 2 diabetes mellitus (HCC) 03/27/2019   CAD (coronary artery disease) 03/05/2019   History of MI (myocardial infarction) 03/05/2019   Type 2 diabetes mellitus with diabetic neuropathy, unspecified (HCC) 03/05/2019   HLD (hyperlipidemia) 03/05/2019   GERD (gastroesophageal reflux disease) 03/05/2019   Hypertension, essential, benign 03/05/2019    Current Outpatient Medications:    aspirin EC 81 MG tablet, Take 1 tablet (81 mg total) by mouth daily. Swallow whole., Disp: 30 tablet, Rfl: 12   atorvastatin (LIPITOR) 80 MG tablet, Take 1 tablet (80 mg total) by mouth daily., Disp: 30 tablet, Rfl: 6   carvedilol (COREG) 6.25 MG tablet, Take 1 tablet (6.25 mg total) by mouth EVERY 12 HOURS (10AM &10PM), Disp: 60 tablet, Rfl: 6   clopidogrel (PLAVIX) 75 MG tablet, Take 1 tablet (75 mg total) by mouth daily., Disp: 30 tablet, Rfl: 3   Continuous Blood Gluc Receiver (DEXCOM G7 RECEIVER) DEVI, Use to check blood sugars, Disp: 3 each, Rfl: 3   cyanocobalamin (VITAMIN B12) 1000 MCG tablet, Take 1,000 mcg by mouth daily., Disp: , Rfl:    empagliflozin (JARDIANCE) 25 MG TABS tablet, Take 1 tablet (25 mg total) by  mouth daily before breakfast., Disp: 90 tablet, Rfl: 1   hydrALAZINE (APRESOLINE) 50 MG tablet, Take 1 tablet (50 mg total) by mouth 3 (three) times daily., Disp: 90 tablet, Rfl: 6   hydrocortisone cream 1 %, Apply 1 Application topically 2 (two) times daily as needed for itching., Disp: 45 g, Rfl: 1   isosorbide mononitrate (IMDUR) 30 MG 24 hr tablet, Take 1 tablet (30 mg total) by mouth daily., Disp: 30 tablet, Rfl: 6    sacubitril-valsartan (ENTRESTO) 24-26 MG, Take 1 tablet by mouth 2 (two) times daily., Disp: 60 tablet, Rfl: 3   spironolactone (ALDACTONE) 25 MG tablet, Take 1 tablet (25 mg total) by mouth daily., Disp: 30 tablet, Rfl: 11   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, Take 1 capsule by mouth daily., Disp: 30 capsule, Rfl: 11   torsemide (DEMADEX) 20 MG tablet, Take 1 tablet (20 mg total) by mouth every Monday, Wednesday, and Friday., Disp: 30 tablet, Rfl: 3   cyanocobalamin 1000 MCG tablet, TAKE ONE TABLET BY MOUTH DAILY (Patient not taking: Reported on 06/14/2022), Disp: 30 tablet, Rfl: 2   DULoxetine (CYMBALTA) 30 MG capsule, Take 1 capsule (30 mg total) by mouth daily., Disp: 30 capsule, Rfl: 3   ondansetron (ZOFRAN) 4 MG tablet, Take 1 tablet (4 mg total) by mouth every 8 (eight) hours as needed for nausea or vomiting. (Patient not taking: Reported on 06/14/2022), Disp: 12 tablet, Rfl: 0   oxyCODONE-acetaminophen (PERCOCET/ROXICET) 5-325 MG tablet, Take 1 tablet by mouth every 4 (four) hours as needed for severe pain. (Patient not taking: Reported on 06/16/2022), Disp: 15 tablet, Rfl: 0 Allergies  Allergen Reactions   Bee Venom Anaphylaxis, Swelling and Other (See Comments)    Swells all over     Social History   Socioeconomic History   Marital status: Single    Spouse name: Not on file   Number of children: Not on file   Years of education: Not on file   Highest education level: Not on file  Occupational History   Not on file  Tobacco Use   Smoking status: Former    Types: Cigarettes    Quit date: 10/06/2017    Years since quitting: 4.6   Smokeless tobacco: Never  Substance and Sexual Activity   Alcohol use: Yes    Comment: Occasional Beer   Drug use: Yes    Types: Marijuana   Sexual activity: Not on file  Other Topics Concern   Not on file  Social History Narrative   Right Handed.    Lives in a two story home    Lives with 27 year old son.    Social Determinants of Health   Financial  Resource Strain: Low Risk  (01/18/2022)   Overall Financial Resource Strain (CARDIA)    Difficulty of Paying Living Expenses: Not hard at all  Food Insecurity: No Food Insecurity (06/04/2022)   Hunger Vital Sign    Worried About Running Out of Food in the Last Year: Never true    Ran Out of Food in the Last Year: Never true  Transportation Needs: No Transportation Needs (06/04/2022)   PRAPARE - Hydrologist (Medical): No    Lack of Transportation (Non-Medical): No  Physical Activity: Inactive (01/18/2022)   Exercise Vital Sign    Days of Exercise per Week: 0 days    Minutes of Exercise per Session: 0 min  Stress: Stress Concern Present (01/18/2022)   [place]  Questionnaire    Feeling of Stress : To some extent  Social Connections: Socially Isolated (01/18/2022)   Social Connection and Isolation Panel [NHANES]    Frequency of Communication with Friends and Family: More than three times a week    Frequency of Social Gatherings with Friends and Family: More than three times a week    Attends Religious Services: Never    Marine scientist or Organizations: No    Attends Archivist Meetings: Never    Marital Status: Never married  Intimate Partner Violence: Not At Risk (01/18/2022)   Humiliation, Afraid, Rape, and Kick questionnaire    Fear of Current or Ex-Partner: No    Emotionally Abused: No    Physically Abused: No    Sexually Abused: No    Physical Exam      Future Appointments  Date Time Provider [place]  06/22/2022 10:30 AM [facility] PA/NP [facility] None  06/29/2022  9:15 AM [place], Gerarda Gunther, RD [facility] NDM  09/24/2022 11:00 AM [name], [name], MD [facility] P[facility]  10/25/2022  1:30 PM Narda Amber K, DO [facility] None

## 2022-06-17 ENCOUNTER — Other Ambulatory Visit (HOSPITAL_COMMUNITY): Payer: Self-pay

## 2022-06-18 ENCOUNTER — Other Ambulatory Visit (HOSPITAL_COMMUNITY): Payer: Self-pay

## 2022-06-18 ENCOUNTER — Other Ambulatory Visit: Payer: Self-pay

## 2022-06-21 ENCOUNTER — Telehealth (HOSPITAL_COMMUNITY): Payer: Self-pay

## 2022-06-21 NOTE — Telephone Encounter (Signed)
Attempted to reach to confirm appointment on 1/30 - no answer and voice mail full

## 2022-06-22 ENCOUNTER — Telehealth (HOSPITAL_COMMUNITY): Payer: Self-pay | Admitting: Emergency Medicine

## 2022-06-22 ENCOUNTER — Other Ambulatory Visit (HOSPITAL_COMMUNITY): Payer: Self-pay | Admitting: Emergency Medicine

## 2022-06-22 ENCOUNTER — Encounter (HOSPITAL_COMMUNITY): Payer: Self-pay

## 2022-06-22 ENCOUNTER — Ambulatory Visit (HOSPITAL_COMMUNITY)
Admission: RE | Admit: 2022-06-22 | Discharge: 2022-06-22 | Disposition: A | Discharge status: Home / Self Care | Payer: Medicare Other | Source: Ambulatory Visit | Attending: Family Medicine | Admitting: Family Medicine

## 2022-06-22 VITALS — BP 122/80 | HR 87 | Wt 165.0 lb

## 2022-06-22 DIAGNOSIS — E854 Organ-limited amyloidosis: Secondary | ICD-10-CM | POA: Diagnosis not present

## 2022-06-22 DIAGNOSIS — Z9181 History of falling: Secondary | ICD-10-CM

## 2022-06-22 DIAGNOSIS — E1122 Type 2 diabetes mellitus with diabetic chronic kidney disease: Secondary | ICD-10-CM | POA: Diagnosis not present

## 2022-06-22 DIAGNOSIS — Z7982 Long term (current) use of aspirin: Secondary | ICD-10-CM | POA: Diagnosis not present

## 2022-06-22 DIAGNOSIS — Z79899 Other long term (current) drug therapy: Secondary | ICD-10-CM | POA: Diagnosis not present

## 2022-06-22 DIAGNOSIS — E11610 Type 2 diabetes mellitus with diabetic neuropathic arthropathy: Secondary | ICD-10-CM | POA: Diagnosis not present

## 2022-06-22 DIAGNOSIS — Z8673 Personal history of transient ischemic attack (TIA), and cerebral infarction without residual deficits: Secondary | ICD-10-CM | POA: Diagnosis not present

## 2022-06-22 DIAGNOSIS — I252 Old myocardial infarction: Secondary | ICD-10-CM | POA: Insufficient documentation

## 2022-06-22 DIAGNOSIS — I130 Hypertensive heart and chronic kidney disease with heart failure and stage 1 through stage 4 chronic kidney disease, or unspecified chronic kidney disease: Secondary | ICD-10-CM | POA: Insufficient documentation

## 2022-06-22 DIAGNOSIS — I2510 Atherosclerotic heart disease of native coronary artery without angina pectoris: Secondary | ICD-10-CM

## 2022-06-22 DIAGNOSIS — E1140 Type 2 diabetes mellitus with diabetic neuropathy, unspecified: Secondary | ICD-10-CM | POA: Diagnosis not present

## 2022-06-22 DIAGNOSIS — I493 Ventricular premature depolarization: Secondary | ICD-10-CM | POA: Diagnosis not present

## 2022-06-22 DIAGNOSIS — I5022 Chronic systolic (congestive) heart failure: Secondary | ICD-10-CM | POA: Insufficient documentation

## 2022-06-22 DIAGNOSIS — N183 Chronic kidney disease, stage 3 unspecified: Secondary | ICD-10-CM | POA: Diagnosis not present

## 2022-06-22 DIAGNOSIS — N1832 Chronic kidney disease, stage 3b: Secondary | ICD-10-CM | POA: Diagnosis not present

## 2022-06-22 DIAGNOSIS — Z91199 Patient's noncompliance with other medical treatment and regimen due to unspecified reason: Secondary | ICD-10-CM | POA: Diagnosis not present

## 2022-06-22 DIAGNOSIS — Z7984 Long term (current) use of oral hypoglycemic drugs: Secondary | ICD-10-CM | POA: Diagnosis not present

## 2022-06-22 DIAGNOSIS — Z7902 Long term (current) use of antithrombotics/antiplatelets: Secondary | ICD-10-CM | POA: Diagnosis not present

## 2022-06-22 DIAGNOSIS — I43 Cardiomyopathy in diseases classified elsewhere: Secondary | ICD-10-CM

## 2022-06-22 DIAGNOSIS — Z139 Encounter for screening, unspecified: Secondary | ICD-10-CM | POA: Diagnosis not present

## 2022-06-22 DIAGNOSIS — I10 Essential (primary) hypertension: Secondary | ICD-10-CM

## 2022-06-22 DIAGNOSIS — E1165 Type 2 diabetes mellitus with hyperglycemia: Secondary | ICD-10-CM | POA: Diagnosis not present

## 2022-06-22 DIAGNOSIS — I5020 Unspecified systolic (congestive) heart failure: Secondary | ICD-10-CM

## 2022-06-22 DIAGNOSIS — R0602 Shortness of breath: Secondary | ICD-10-CM | POA: Insufficient documentation

## 2022-06-22 DIAGNOSIS — G629 Polyneuropathy, unspecified: Secondary | ICD-10-CM

## 2022-06-22 LAB — BASIC METABOLIC PANEL
Anion gap: 10 (ref 5–15)
BUN: 16 mg/dL (ref 6–20)
CO2: 26 mmol/L (ref 22–32)
Calcium: 9 mg/dL (ref 8.9–10.3)
Chloride: 101 mmol/L (ref 98–111)
Creatinine, Ser: 1.48 mg/dL — ABNORMAL HIGH (ref 0.61–1.24)
GFR, Estimated: 54 mL/min — ABNORMAL LOW (ref >60)
Glucose, Bld: 213 mg/dL — ABNORMAL HIGH (ref 70–99)
Potassium: 4.1 mmol/L (ref 3.5–5.1)
Sodium: 137 mmol/L (ref 135–145)

## 2022-06-22 LAB — BRAIN NATRIURETIC PEPTIDE: B Natriuretic Peptide: 359 pg/mL — ABNORMAL HIGH (ref 0.0–100.0)

## 2022-06-22 NOTE — Telephone Encounter (Signed)
Called and LVM to remind Mr. Och of his clinic visit this morning at 10:30.    Renee Ramus, [place] 06/22/2022

## 2022-06-22 NOTE — Progress Notes (Signed)
ReDS Vest / Clip - 06/22/22 1000       ReDS Vest / Clip   Station Marker C    Ruler Value 29    ReDS Value Range Low volume    ReDS Actual Value 32    Anatomical Comments sitting

## 2022-06-22 NOTE — Patient Instructions (Addendum)
Thank you for coming in today  Labs were done today, if any labs are abnormal the clinic will call you No news is good news   Your physician recommends that you schedule a follow-up appointment in:  4 month with echocardiogram with Dr. Haroldine Laws You will receive a reminder letter in the mail a few months in advance. If you don't receive a letter, please call our office to schedule the follow-up appointment.    Do the following things EVERYDAY: Weigh yourself in the morning before breakfast. Write it down and keep it in a log. Take your medicines as prescribed Eat low salt foods--Limit salt (sodium) to 2000 mg per day.  Stay as active as you can everyday Limit all fluids for the day to less than 2 liters   At the [facility], you and your health needs are our priority. As part of our continuing mission to provide you with exceptional heart care, we have created designated Provider Care Teams. These Care Teams include your primary Cardiologist (physician) and Advanced Practice Providers (APPs- Physician Assistants and Nurse Practitioners) who all work together to provide you with the care you need, when you need it.   You may see any of the following providers on your designated Care Team at your next follow up: Dr Glori Bickers Dr Loralie Champagne Dr. Roxana Hires, NP Lyda Jester, [place] [facility] [place], [place] Forestine Na, NP Audry Riles, PharmD   Please be sure to bring in all your medications bottles to every appointment.    Thank you for choosing [facility]    If you have any questions or concerns before your next appointment please send [place] a message through [place] or call our office at [phone].    TO LEAVE A MESSAGE FOR THE NURSE SELECT OPTION 2, PLEASE LEAVE A MESSAGE INCLUDING: YOUR NAME DATE OF BIRTH CALL BACK NUMBER REASON FOR CALL**this is important as we prioritize the  call backs  YOU WILL RECEIVE A CALL BACK THE SAME DAY AS LONG AS YOU CALL BEFORE 4:00 PM

## 2022-06-22 NOTE — Progress Notes (Unsigned)
Paramedicine Encounter   Patient ID: Thomas West , male,   DOB: [birth year],61 y.o.,  MRN: [id number]   Met patient in clinic today with provider.  Time spent with patient 30 minutes  No med changes at this time.  Next clinic visit repeat ECHO.    Renee Ramus, [place] 06/22/2022

## 2022-06-22 NOTE — Progress Notes (Signed)
Advanced Heart Failure Clinic Note   Primary Care: Dr. Betty [name] HF Cardiologist: Dr. Gala Romney  HPI: Thomas West is a 62 y.o. male with history of CAD with prior MI and stent in 01/2019 at [facility] in [place] (report not available), cardiomyopathy/chronic systolic HF, uncontrolled DM II, HTN, hx CVA on chart review, CKD.    Moved to [place] from [place], [place] 2 years ago.   Had not medical f/u until he was admitted in 10/22 with a/c systolic HF. Had been out of cardiac medications. Diuresed with IV lasix then transitioned to po lasix 40 mg daily. Started on GDMT with carvedilol, hydralazine, imdur and empagliflozin.    Echo 10/22 with EF 20-25%, RV okay, trivial MR   [facility] 10/22 with nonobstructive CAD and patent RPLV stent. RA mean 5 mmHg, PCWP mean 5 mmHg, Fick CO 7.46/CI 3.85.   He did not show up for [facility] appointment after discharge.   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  Admitted 12/22 with a/c CHF due to noncompliance with GDMT. cMRI strongly suggestive of amyloidosis, GDMT titrated. Admitted 2/23 with a/c CHF after running out of meds x 1 week. Given IV lasix, GDMT restarted. Imdur held with low BP. Seen in ED 3/23 with back pain, felt to be related to MSK. Seen in ED 08/18/21 with SOB, cardiac work up unrevealing.  Admitted 12/07/21-12/10/21 post fall w/ a/c CHF. Diuresed well with IV lasix/metolazone and discharged. He developed an AKI Cr >> 1.2>>1.75, Entresto and spiro were held and he was instructed to restart the next day following discharge. He was seen by paramedicine the following day and he was in no acute distress, taking all meds. Denied CP, SOB and overall feeling well.   Readmitted 12/12/21 w/ 2 days of persistent N/V/D with intermittent CP and SOB. Presented with AKI cr 3.3 2/2 emesis/diarrhea. Responded well to 500 mL NS bolus and  holding of Entresto and spiro. Restarted meds once discharged.   Admitted 8/23 with a/c CHF. Diuresed with IV lasix. Hospitalization c/b with AKI, GDMT initially held; Entresto resumed at low dose, torsemide on MWF. Discharged home, weight 145 lbs.  Follow up 11/23, NYHA II and volume OK on torsemide 20 MWF.   Seen in ED 06/01/22 with CP and SOB. EKG without acute changes, HsTroponins negative x 3. CT chest showed evidence of bronchitis, no PE or PNA. CT abd/pelvis showed left inguinal hernia and GS recommended outpatient follow up. He was treated with abx over concern for early PNA and discharged home.  Today he returns for post ED evaluation HF follow up with Thomas West with paramedicine.Overall feeling fine. Has SOB walking up steps. Back is painful after previous fall, feels swelling.  Neuropathy keeping him up at night. Off gabapentin and trying Cymbalta. Denies palpitations CP, dizziness, or PND/Orthopnea. Appetite ok. No fever or chills. Weight at home 157 pounds. Taking all medications. Saw Neurology last week and will start PT for balance training.  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   - cMRI (11/22): LVEF 24% RVEF 21% LGE and ECW suggestive of cardiac amyloidosis  ROS: All systems reviewed and negative except as per HPI.   Past Medical History:  Diagnosis Date   CHF (congestive  heart failure) ([place])    Coronary artery disease    Diabetes mellitus without complication (HCC)    History of MI (myocardial infarction) 03/05/2019   Hypertension    Stroke [facility])    Current Outpatient Medications  Medication Sig Dispense Refill   aspirin EC 81 MG tablet Take 1 tablet (81 mg total) by mouth daily. Swallow whole. 30 tablet 12   atorvastatin (LIPITOR) 80 MG tablet Take 1 tablet (80 mg total) by mouth daily. 30 tablet 6   carvedilol  (COREG) 6.25 MG tablet Take 1 tablet (6.25 mg total) by mouth EVERY 12 HOURS (10AM &10PM) 60 tablet 6   clopidogrel (PLAVIX) 75 MG tablet Take 1 tablet (75 mg total) by mouth daily. 30 tablet 3   Continuous Blood Gluc Receiver (DEXCOM G7 RECEIVER) DEVI Use to check blood sugars 3 each 3   cyanocobalamin (VITAMIN B12) 1000 MCG tablet Take 1,000 mcg by mouth daily.     cyanocobalamin 1000 MCG tablet TAKE ONE TABLET BY MOUTH DAILY 30 tablet 2   empagliflozin (JARDIANCE) 25 MG TABS tablet Take 1 tablet (25 mg total) by mouth daily before breakfast. 90 tablet 1   hydrALAZINE (APRESOLINE) 50 MG tablet Take 1 tablet (50 mg total) by mouth 3 (three) times daily. 90 tablet 6   hydrocortisone cream 1 % Apply 1 Application topically 2 (two) times daily as needed for itching. 45 g 1   isosorbide mononitrate (IMDUR) 30 MG 24 hr tablet Take 1 tablet (30 mg total) by mouth daily. 30 tablet 6   sacubitril-valsartan (ENTRESTO) 24-26 MG Take 1 tablet by mouth 2 (two) times daily. 60 tablet 3   spironolactone (ALDACTONE) 25 MG tablet Take 1 tablet (25 mg total) by mouth daily. 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 30 capsule 11   torsemide (DEMADEX) 20 MG tablet Take 1 tablet (20 mg total) by mouth every Monday, Wednesday, and Friday. 30 tablet 3   DULoxetine (CYMBALTA) 30 MG capsule Take 1 capsule (30 mg total) by mouth daily. (Patient not taking: Reported on 06/22/2022) 30 capsule 3   ondansetron (ZOFRAN) 4 MG tablet Take 1 tablet (4 mg total) by mouth every 8 (eight) hours as needed for nausea or vomiting. (Patient not taking: Reported on 06/22/2022) 12 tablet 0   No current facility-administered medications for this encounter.   Allergies  Allergen Reactions   Bee Venom Anaphylaxis, Swelling and Other (See Comments)    Swells all over   Social History   Socioeconomic History   Marital status: Single    Spouse name: Not on file   Number of children: Not on file   Years of  education: Not on file   Highest education level: Not on file  Occupational History   Not on file  Tobacco Use   Smoking status: Former    Types: Cigarettes    Quit date: 10/06/2017    Years since quitting: 4.7   Smokeless tobacco: Never  Substance and Sexual Activity   Alcohol use: Yes    Comment: Occasional Beer   Drug use: Yes    Types: Marijuana   Sexual activity: Not on file  Other Topics Concern   Not on file  Social History Narrative   Right Handed.    Lives in a two story home    Lives with 15 year old son.    Social Determinants of Health   Financial Resource Strain: Low Risk  (01/18/2022)  Overall Financial Resource Strain (CARDIA)    Difficulty of Paying Living Expenses: Not hard at all  Food Insecurity: No Food Insecurity (06/04/2022)   Hunger Vital Sign    Worried About Running Out of Food in the Last Year: Never true    Ran Out of Food in the Last Year: Never true  Transportation Needs: No Transportation Needs (06/04/2022)   PRAPARE - Hydrologist (Medical): No    Lack of Transportation (Non-Medical): No  Physical Activity: Inactive (01/18/2022)   Exercise Vital Sign    Days of Exercise per Week: 0 days    Minutes of Exercise per Session: 0 min  Stress: Stress Concern Present (01/18/2022)   [place]    Feeling of Stress : To some extent  Social Connections: Socially Isolated (01/18/2022)   Social Connection and Isolation Panel [NHANES]    Frequency of Communication with Friends and Family: More than three times a week    Frequency of Social Gatherings with Friends and Family: More than three times a week    Attends Religious Services: Never    Marine scientist or Organizations: No    Attends Archivist Meetings: Never    Marital Status: Never married  Intimate Partner Violence: Not At Risk (01/18/2022)   Humiliation, Afraid, Rape, and Kick  questionnaire    Fear of Current or Ex-Partner: No    Emotionally Abused: No    Physically Abused: No    Sexually Abused: No   Family History  Problem Relation Age of Onset   Diabetes Mother    Kidney failure Mother    BP 122/80   Pulse 87   Wt 74.8 kg (165 lb)   SpO2 98%   BMI 24.37 kg/m   Wt Readings from Last 3 Encounters:  06/22/22 74.8 kg (165 lb)  06/22/22 74.8 kg (165 lb)  06/16/22 71.3 kg (157 lb 3.2 oz)   PHYSICAL EXAM: General:  NAD. No resp difficulty, arrived in [facility] HEENT: Normal Neck: Supple. No JVD. Carotids 2+ bilat; no bruits. No lymphadenopathy or thryomegaly appreciated. Cor: PMI nondisplaced. Regular rate & rhythm. No rubs, gallops or murmurs. Lungs: Clear Abdomen: Soft, nontender, nondistended. No hepatosplenomegaly. No bruits or masses. Good bowel sounds. Extremities: No cyanosis, clubbing, rash, edema Neuro: Alert & oriented x 3, cranial nerves grossly intact. Moves all 4 extremities w/o difficulty. Affect pleasant.  ECG (personally reviewed): NSR 89 bpm, 1 PVC  REDs: 32%  ASSESSMENT & PLAN: Chronic Systolic HF/Cardiac amyloidosis - Onset not certain. He reports cardiomyopathy diagnosed just prior to MI while living in [place] a couple of years ago. - Echo (10/22): EF 20-25%, RV ok - R/LHC (10/22): Patent RPLV stent with nonobstructive disease elsewhere, Preserved CO/CI, RA mean 5 mmHg, PCWP mean 5 mmHg - cMRI (11/22): LVEF 24% RVEF 21% LGE and ECW suggestive of cardiac amyloidosis. 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 - Multiple myeloma panel (12/22) with no m-spike. 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 Volume looks OK today, weight up some but ReDs 32% - Continue torsemide 20 mg MWF. Can take extra 20 mg PRN weight gain/swelling. - Continue hydralazine 50 mg tid and Imdur 30 mg daily.  - Continue Entresto 24/26 mg bid. -  Continue Jardiance 10 mg daily. - Continue carvedilol 6.25 mg bid.  -  Continue spironolactone 25 mg daily.   - Continue Tafamadis.  - Repeat echo at next visit. If EF still < 35%, consider ICD - Labs today.  2. wtTTR cardiac amyloidosis - Continue tafamadis. - Has seen Dr. Deliah Boston  - Considered Patisiran for polyneuropathy, however current therapies not approved for wild-type TTR   3. CAD - Prior MI followed by PCI in [place] in either 2019 or 2020.  - Patent RPLV stent on [facility] 10/22. Also had 20% distal RCA, 30% p LAD, 70% d LAD, 60% OM2 - Stent placed 2020. >34yr since stent placement, could consider stopping Plavix d/t issues with noncompliance. - Recent ED visit with CP, felt bronchitic. ECG re-assuring and troponin neg x 3. BNP was 803, pain could be 2/2 to volume overload. - No further CP.  - Continue ASA.  - Continue atorvastatin 80 mg daily.   4. HTN - Blood pressure well controlled.  - Continue current regimen.  5. Uncontrolled DM - A1c down to 7.3 from 12. - Continue Jardiance.  6. PVCs - 1 PVC ECG today. - Asymptomatic.   7. Neuropathy - Unclear if due to DM2 or TTR - gabapentin not helping, now on Cymbalta 30. - Saw Dr. Posey Pronto in Neurology as above, with wild type TTR, current therapies not approved. - Continue supportive care.  8. CKD stage IIIb - Baseline SCr 1.7-2.0 - BMET today.   9. Hx fall - CT: L-spine on 12/08/2021 was negative for acute fracture or subluxation of the L-spine. - No further falls. Neurology has arranged balance PT.  10. SDOH - He has Medicaid and disability. - Transportation is an issue, he has a car but unable to drive (needs license). - 19 year old son helps with meds. - Paramedicine follows, appreciated their assistance.  Follow up in 4 months with Dr. Haroldine Laws + echo.    Thomas Bihari, FNP  10:10 AM

## 2022-06-24 ENCOUNTER — Other Ambulatory Visit (HOSPITAL_COMMUNITY): Payer: Self-pay | Admitting: Emergency Medicine

## 2022-06-24 NOTE — Progress Notes (Signed)
Paramedicine Encounter    Patient ID: Thomas West, male    DOB: 1961/03/03, 62 y.o.   MRN: [id number]   Complaints NONE  Assessment A&O x 4, no chest pain or SOB.  No edema  Compliance with meds Missed at least 1/2 mostly pm and noon doses  Pill box filled x 1 week  Refills needed Jardiance  Meds changes since last visit none    Social changes none   BP (!) 160/100 (BP Location: Left Arm, Patient Position: Sitting, Cuff Size: Normal)   Pulse 87   Resp 16   Wt 164 lb (74.4 kg)   SpO2 96%   BMI 24.22 kg/m  Weight yesterday-not taken Last visit weight-165lb  ATF Mr. Burgett A&O x 4, skin W&D w/ good color.  He denies chest pain or SOB.  Lung sounds clear and equal bilat.  No edema noted  Med box reconciled x 1 week.  He is not taking his noon Hydralazine and missed 4 out of 7 p.m. doses.  Called in refill for Jardiance and check on the status of Cymbalta as it should be delivered.   Pt hypertensive today and discussed med compliance and nutritional choices.    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   Pruritic rash 05/25/2022   Polyneuropathy associated with underlying disease ([place]) 01/15/2022   Atherosclerosis of aorta ([place]) 01/15/2022   History of CVA (cerebrovascular accident) without residual deficits 12/24/2021   AKI (acute kidney injury) ([place]) 12/13/2021   Diarrhea 12/13/2021   Shortness of breath 12/13/2021   Chest pain 12/13/2021   Nausea and vomiting 12/13/2021   Heart failure ([place]) 12/08/2021   Hypertension associated with diabetes ([place]) 07/12/2021   Chronic kidney disease, stage 3a ([place]) 07/12/2021   Hyperkalemia 07/12/2021   CHF exacerbation  ([place]) 04/20/2021   Acute on chronic combined systolic and diastolic CHF (congestive heart failure) ([place]) 03/09/2021   Acute on chronic HFrEF (heart failure with reduced ejection fraction) ([place]) 03/08/2021   HFrEF (heart failure with reduced ejection fraction) ([place]) 02/03/2021   Diabetes mellitus ([place]) 03/27/2019   Hyperlipidemia associated with type 2 diabetes mellitus ([place]) 03/27/2019   CAD (coronary artery disease) 03/05/2019   History of MI (myocardial infarction) 03/05/2019   Type 2 diabetes mellitus with diabetic neuropathy, unspecified ([place]) 03/05/2019   HLD (hyperlipidemia) 03/05/2019   GERD (gastroesophageal reflux disease) 03/05/2019   Hypertension, essential, benign 03/05/2019    Current Outpatient Medications:    aspirin EC 81 MG tablet, Take 1 tablet (81 mg total) by mouth daily. Swallow whole., Disp: 30 tablet, Rfl: 12   atorvastatin (LIPITOR) 80 MG tablet, Take 1 tablet (80 mg total) by mouth daily., Disp: 30 tablet, Rfl: 6   carvedilol (COREG) 6.25 MG tablet, Take 1 tablet (6.25 mg total) by mouth EVERY 12 HOURS (10AM &10PM), Disp: 60 tablet, Rfl: 6   clopidogrel (PLAVIX) 75 MG tablet, Take 1 tablet (75 mg total) by mouth daily., Disp: 30 tablet, Rfl: 3   Continuous Blood Gluc Receiver ([place]) DEVI, Use to check blood sugars, Disp: 3 each, Rfl: 3   cyanocobalamin (VITAMIN B12) 1000 MCG tablet, Take 1,000 mcg by mouth daily., Disp: , Rfl:    cyanocobalamin 1000 MCG tablet,  TAKE ONE TABLET BY MOUTH DAILY, Disp: 30 tablet, Rfl: 2   empagliflozin (JARDIANCE) 25 MG TABS tablet, Take 1 tablet (25 mg total) by mouth daily before breakfast., Disp: 90 tablet, Rfl: 1   hydrALAZINE (APRESOLINE) 50 MG tablet, Take 1 tablet (50 mg total) by mouth 3 (three) times daily., Disp: 90 tablet, Rfl: 6   hydrocortisone cream 1 %, Apply 1 Application topically 2 (two) times daily as needed for itching., Disp: 45 g, Rfl: 1   isosorbide mononitrate (IMDUR) 30 MG 24 hr tablet, Take 1  tablet (30 mg total) by mouth daily., Disp: 30 tablet, Rfl: 6   sacubitril-valsartan (ENTRESTO) 24-26 MG, Take 1 tablet by mouth 2 (two) times daily., Disp: 60 tablet, Rfl: 3   spironolactone (ALDACTONE) 25 MG tablet, Take 1 tablet (25 mg total) by mouth daily., Disp: 30 tablet, Rfl: 11   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, Take 1 capsule by mouth daily., Disp: 30 capsule, Rfl: 11   DULoxetine (CYMBALTA) 30 MG capsule, Take 1 capsule (30 mg total) by mouth daily. (Patient not taking: Reported on 06/22/2022), Disp: 30 capsule, Rfl: 3   ondansetron (ZOFRAN) 4 MG tablet, Take 1 tablet (4 mg total) by mouth every 8 (eight) hours as needed for nausea or vomiting., Disp: 12 tablet, Rfl: 0   torsemide (DEMADEX) 20 MG tablet, Take 1 tablet (20 mg total) by mouth every Monday, Wednesday, and Friday., Disp: 30 tablet, Rfl: 3 Allergies  Allergen Reactions   Bee Venom Anaphylaxis, Swelling and Other (See Comments)    Swells all over     Social History   Socioeconomic History   Marital status: Single    Spouse name: Not on file   Number of children: Not on file   Years of education: Not on file   Highest education level: Not on file  Occupational History   Not on file  Tobacco Use   Smoking status: Former    Types: Cigarettes    Quit date: 10/06/2017    Years since quitting: 4.7   Smokeless tobacco: Never  Substance and Sexual Activity   Alcohol use: Yes    Comment: Occasional Beer   Drug use: Yes    Types: Marijuana   Sexual activity: Not on file  Other Topics Concern   Not on file  Social History Narrative   Right Handed.    Lives in a two story home    Lives with 67 year old son.    Social Determinants of Health   Financial Resource Strain: Low Risk  (01/18/2022)   Overall Financial Resource Strain (CARDIA)    Difficulty of Paying Living Expenses: Not hard at all  Food Insecurity: No Food Insecurity (06/04/2022)   Hunger Vital Sign    Worried About Running Out of Food in the Last Year: Never  true    Ran Out of Food in the Last Year: Never true  Transportation Needs: No Transportation Needs (06/04/2022)   PRAPARE - Hydrologist (Medical): No    Lack of Transportation (Non-Medical): No  Physical Activity: Inactive (01/18/2022)   Exercise Vital Sign    Days of Exercise per Week: 0 days    Minutes of Exercise per Session: 0 min  Stress: Stress Concern Present (01/18/2022)   [place]    Feeling of Stress : To some extent  Social Connections: Socially Isolated (01/18/2022)   Social Connection and Isolation Panel [NHANES]    Frequency  of Communication with Friends and Family: More than three times a week    Frequency of Social Gatherings with Friends and Family: More than three times a week    Attends Religious Services: Never    Marine scientist or Organizations: No    Attends Archivist Meetings: Never    Marital Status: Never married  Intimate Partner Violence: Not At Risk (01/18/2022)   Humiliation, Afraid, Rape, and Kick questionnaire    Fear of Current or Ex-Partner: No    Emotionally Abused: No    Physically Abused: No    Sexually Abused: No    Physical Exam      Future Appointments  Date Time Provider [place]  06/29/2022  9:15 AM El-Khouri, Gerarda Gunther, RD [place] [facility]  09/24/2022 11:00 AM [name], [name], MD [facility]  10/25/2022  1:30 PM Narda Amber K, DO [facility] None

## 2022-06-25 ENCOUNTER — Telehealth (HOSPITAL_COMMUNITY): Payer: Self-pay | Admitting: Emergency Medicine

## 2022-06-25 ENCOUNTER — Other Ambulatory Visit (HOSPITAL_COMMUNITY): Payer: Self-pay | Admitting: Family Medicine

## 2022-06-25 ENCOUNTER — Other Ambulatory Visit (HOSPITAL_COMMUNITY): Payer: Self-pay

## 2022-06-25 NOTE — Telephone Encounter (Signed)
[facility] to request refill on Jardiance and checked status of Cymbalta.  Should be mailed out for receipt early next week.    Renee Ramus, [place] 06/25/2022

## 2022-06-29 ENCOUNTER — Other Ambulatory Visit: Payer: Self-pay | Admitting: Family Medicine

## 2022-06-29 ENCOUNTER — Encounter: Payer: Self-pay | Admitting: Dietician

## 2022-06-29 ENCOUNTER — Encounter: Payer: Medicare Other | Attending: Family Medicine | Admitting: Dietician

## 2022-06-29 VITALS — Ht 69.0 in | Wt 157.0 lb

## 2022-06-29 DIAGNOSIS — E1140 Type 2 diabetes mellitus with diabetic neuropathy, unspecified: Secondary | ICD-10-CM | POA: Insufficient documentation

## 2022-06-29 NOTE — Progress Notes (Signed)
Diabetes Self-Management Education  Visit Type: First/Initial  Appt. Start Time: 0905 Appt. End Time: 9735  06/29/2022  [name], identified by name and date of birth, is a 62 y.o. male with a diagnosis of Diabetes: Type 2.   ASSESSMENT  Primary concern: pt is concerned about his diabetes and neuropathy.   History includes: CHF, type 2 diabetes, HTN, stroke Labs noted: 05/25/22 A1c 7.5% Medications include: jardiance Supplements: not assessed CGM: dexcom G7  Pt states he has neuropathy in his feet and legs and this causes him some pain and he is concerned about it.  Pt states he has noticed improvements in his health since he changed his diet. He states he eats differently now than he used to, including more vegetables.   Pt reports he walks around his apartment complex for 15 minutes daily.   Pt lives with 60 year old son. Pt states he enjoys cooking but he often gets subway for lunch.   Height 5\' 9"  (1.753 m), weight 157 lb (71.2 kg). Body mass index is 23.18 kg/m.   Diabetes Self-Management Education - 06/29/22 0901       Visit Information   Visit Type First/Initial      Initial Visit   Diabetes Type Type 2    Date Diagnosed 5 years ago    Are you currently following a meal plan? No    Are you taking your medications as prescribed? Yes      Health Coping   How would you rate your overall health? Fair      Psychosocial Assessment   Patient Belief/Attitude about Diabetes Defeat/Burnout    What is the hardest part about your diabetes right now, causing you the most concern, or is the most worrisome to you about your diabetes?   Making healty food and beverage choices    Self-care barriers None    Self-management support Doctor's office    Other persons present Patient    Patient Concerns Nutrition/Meal planning    Special Needs None    Preferred Learning Style No preference indicated    Learning Readiness Ready    How often do you need to have someone  help you when you read instructions, pamphlets, or other written materials from your doctor or pharmacy? 1 - Never    What is the last grade level you completed in school? some college      Pre-Education Assessment   Patient understands the diabetes disease and treatment process. Needs Instruction    Patient understands incorporating nutritional management into lifestyle. Needs Instruction    Patient undertands incorporating physical activity into lifestyle. Needs Instruction    Patient understands using medications safely. Needs Instruction    Patient understands monitoring blood glucose, interpreting and using results Needs Instruction    Patient understands prevention, detection, and treatment of acute complications. Needs Instruction    Patient understands prevention, detection, and treatment of chronic complications. Needs Instruction    Patient understands how to develop strategies to address psychosocial issues. Needs Instruction    Patient understands how to develop strategies to promote health/change behavior. Needs Instruction      Complications   Last HgB A1C per patient/outside source 7.5 %    How often do you check your blood sugar? 3-4 times/day    Fasting Blood glucose range (mg/dL) 70-129    Postprandial Blood glucose range (mg/dL) 130-179    Have you had a dilated eye exam in the past 12 months? No    Have  you had a dental exam in the past 12 months? No    Are you checking your feet? Yes    How many days per week are you checking your feet? 2      Dietary Intake   Breakfast scrambled eggs and sausage and whole wheat toast    Snack (morning) none    Lunch subway sandwich 6in with veggies OR skips    Snack (afternoon) fruit OR none    Dinner fish with salad and avocado and sometime piece of bread    Snack (evening) none OR butter pecan ice cream    Beverage(s) water, juice with breakfast or dinner      Activity / Exercise   Activity / Exercise Type Light (walking /  raking leaves)    How many days per week do you exercise? 5    How many minutes per day do you exercise? 15    Total minutes per week of exercise 75      Patient Education   Previous Diabetes Education No    Disease Pathophysiology Definition of diabetes, type 1 and 2, and the diagnosis of diabetes;Factors that contribute to the development of diabetes;Explored patient's options for treatment of their diabetes    Healthy Eating Role of diet in the treatment of diabetes and the relationship between the three main macronutrients and blood glucose level;Food label reading, portion sizes and measuring food.;Carbohydrate counting;Reviewed blood glucose goals for pre and post meals and how to evaluate the patients' food intake on their blood glucose level.;Meal timing in regards to the patients' current diabetes medication.;Information on hints to eating out and maintain blood glucose control.;Meal options for control of blood glucose level and chronic complications.    Being Active Role of exercise on diabetes management, blood pressure control and cardiac health.;Helped patient identify appropriate exercises in relation to his/her diabetes, diabetes complications and other health issue.    Medications Reviewed medication adjustment guidelines for hyperglycemia and sick days.;Reviewed patients medication for diabetes, action, purpose, timing of dose and side effects.    Monitoring Identified appropriate SMBG and/or A1C goals.;Daily foot exams;Yearly dilated eye exam    Acute complications Taught prevention, symptoms, and  treatment of hypoglycemia - the 15 rule.;Discussed and identified patients' prevention, symptoms, and treatment of hyperglycemia.    Chronic complications Relationship between chronic complications and blood glucose control;Assessed and discussed foot care and prevention of foot problems;Lipid levels, blood glucose control and heart disease;Identified and discussed with patient  current  chronic complications;Dental care;Nephropathy, what it is, prevention of, the use of ACE, ARB's and early detection of through urine microalbumia.;Retinopathy and reason for yearly dilated eye exams;Reviewed with patient heart disease, higher risk of, and prevention    Diabetes Stress and Support Identified and addressed patients feelings and concerns about diabetes;Worked with patient to identify barriers to care and solutions;Role of stress on diabetes    Lifestyle and Health Coping Lifestyle issues that need to be addressed for better diabetes care      Individualized Goals (developed by patient)   Nutrition Follow meal plan discussed    Physical Activity Exercise 5-7 days per week;15 minutes per day    Medications take my medication as prescribed    Monitoring  Test my blood glucose as discussed    Problem Solving Eating Pattern    Reducing Risk examine blood glucose patterns;do foot checks daily;treat hypoglycemia with 15 grams of carbs if blood glucose less than 70mg /dL    Health Coping Ask for help with  psychological, social, or emotional issues      Post-Education Assessment   Patient understands the diabetes disease and treatment process. Comprehends key points    Patient understands incorporating nutritional management into lifestyle. Comprehends key points    Patient undertands incorporating physical activity into lifestyle. Comprehends key points    Patient understands using medications safely. Demonstrates understanding / competency    Patient understands monitoring blood glucose, interpreting and using results Demonstrates understanding / competency    Patient understands prevention, detection, and treatment of acute complications. Comprehends key points    Patient understands prevention, detection, and treatment of chronic complications. Comprehends key points    Patient understands how to develop strategies to address psychosocial issues. Comprehends key points    Patient  understands how to develop strategies to promote health/change behavior. Comprehends key points      Outcomes   Expected Outcomes Demonstrated interest in learning. Expect positive outcomes    Future DMSE 3-4 months    Program Status Not Completed             Individualized Plan for Diabetes Self-Management Training:   Learning Objective:  Patient will have a greater understanding of diabetes self-management. Patient education plan is to attend individual and/or group sessions per assessed needs and concerns.   Plan:   Patient Instructions  Aim for 150 minutes of physical activity weekly. Goal 1: Walk 3 times around complex every day.   Goal 2: drink juice only 1-2 times per week instead of every day.   Goal 3: aim to make 1/2 of your plate vegetables at least 2x/day  Aim to eat 3 meals a day and snacks if needed.   When snacking pair a carbohydrate with a protein (see snack sheet)   Expected Outcomes:  Demonstrated interest in learning. Expect positive outcomes  Education material provided: ADA - How to Thrive: A Guide for Your Journey with Diabetes and Snack sheet, Meal Ideas  If problems or questions, patient to contact team via:  Phone  Future DSME appointment: 3-4 months

## 2022-06-29 NOTE — Patient Instructions (Addendum)
Aim for 150 minutes of physical activity weekly. Goal 1: Walk 3 times around complex every day.   Goal 2: drink juice only 1-2 times per week instead of every day.   Goal 3: aim to make 1/2 of your plate vegetables at least 2x/day  Aim to eat 3 meals a day and snacks if needed.   When snacking pair a carbohydrate with a protein (see snack sheet)

## 2022-06-30 ENCOUNTER — Telehealth (HOSPITAL_COMMUNITY): Payer: Self-pay | Admitting: Emergency Medicine

## 2022-06-30 NOTE — Telephone Encounter (Signed)
Reached out to Thomas West to see if he had received his Dexcom7 yesterday and he has not.  I see where refills were approved by Dr. [name] to [facility] and will touch base with them today to make sure they are going to be delivered to his residence.    Renee Ramus, [place] 06/30/2022

## 2022-07-01 ENCOUNTER — Other Ambulatory Visit: Payer: Self-pay

## 2022-07-01 ENCOUNTER — Other Ambulatory Visit (HOSPITAL_COMMUNITY): Payer: Self-pay | Admitting: Emergency Medicine

## 2022-07-01 ENCOUNTER — Other Ambulatory Visit (HOSPITAL_COMMUNITY): Payer: Self-pay

## 2022-07-01 ENCOUNTER — Other Ambulatory Visit (HOSPITAL_COMMUNITY): Payer: Self-pay | Admitting: Family Medicine

## 2022-07-01 MED ORDER — EMPAGLIFLOZIN 10 MG PO TABS
10.0000 mg | ORAL_TABLET | Freq: Every day | ORAL | 3 refills | Status: DC
  Filled 2022-07-01: qty 30, 30d supply, fill #0

## 2022-07-01 NOTE — Progress Notes (Signed)
Paramedicine Encounter    Patient ID: Thomas West, male    DOB: Jun 14, 1960, 62 y.o.   MRN: [id number]   Complaints no complaints  Assessment No chest pain or SOB. No edema, lung sounds clear.  Compliance with meds Missed 1 Carvedilol and 1 Entresto out of 1 week regimen PM dose.  Pill box filled x 1 week  Refills needed Jardiance, Entresto, Spiro, B12, Clopidogrel, ASA  Meds changes since last visit NONE    Social changes NONE   BP 120/80 (BP Location: Left Arm, Patient Position: Sitting, Cuff Size: Normal)   Pulse 80   Resp 16   Wt 164 lb (74.4 kg)   SpO2 96%   BMI 24.22 kg/m  Weight yesterday-not taken Last visit weight-164lb   Home visit today with Med rec x 1 week.  When pt receives his refills on his Vania Rea he is to add 1 tablet of the Jardiance to Tues. & Wed a.m. dose.  He verbalizes he understands and I labeled his pill box with a sticky note with instructions. Also, pt had been calling me regarding not receiving his DexCom 7.  I contacted the pharmacy this morning and they advised they did have it and would deliver it to Thomas West's residence today.  I instructed pt that in the future when he discovers he doesn't have a prescription he should reach out to the pharmacy to inquire about the status of same. Advised him that this is his responsibility and that I won't always be around to do his legwork.   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   Pruritic rash 05/25/2022   Polyneuropathy associated with underlying disease ([place]) 01/15/2022   Atherosclerosis of aorta ([place]) 01/15/2022   History of CVA (cerebrovascular accident) without residual  deficits 12/24/2021   AKI (acute kidney injury) ([place]) 12/13/2021   Diarrhea 12/13/2021   Shortness of breath 12/13/2021   Chest pain 12/13/2021   Nausea and vomiting 12/13/2021   Heart failure ([place]) 12/08/2021   Hypertension associated with diabetes ([place]) 07/12/2021   Chronic kidney disease, stage 3a ([place]) 07/12/2021   Hyperkalemia 07/12/2021   CHF exacerbation ([place]) 04/20/2021   Acute on chronic combined systolic and diastolic CHF (congestive heart failure) ([place]) 03/09/2021   Acute on chronic HFrEF (heart failure with reduced ejection fraction) ([place]) 03/08/2021   HFrEF (heart failure with reduced ejection fraction) ([place]) 02/03/2021   Diabetes mellitus ([place]) 03/27/2019   Hyperlipidemia associated with type 2 diabetes mellitus ([place]) 03/27/2019   CAD (coronary artery disease) 03/05/2019   History of MI (myocardial infarction) 03/05/2019   Type 2 diabetes mellitus with diabetic neuropathy, unspecified ([place]) 03/05/2019   HLD (hyperlipidemia) 03/05/2019   GERD (gastroesophageal reflux disease) 03/05/2019   Hypertension, essential, benign 03/05/2019    Current Outpatient Medications:    aspirin EC 81 MG tablet, Take 1 tablet (81 mg total) by mouth daily. Swallow whole., Disp: 30 tablet, Rfl: 12   atorvastatin (LIPITOR) 80 MG tablet, Take 1 tablet (80 mg total) by mouth daily., Disp: 30 tablet, Rfl: 6   carvedilol (COREG) 6.25 MG tablet, Take 1 tablet (6.25 mg total) by mouth EVERY 12 HOURS (10AM &10PM), Disp: 60 tablet, Rfl: 6   clopidogrel (PLAVIX)  75 MG tablet, Take 1 tablet (75 mg total) by mouth daily., Disp: 30 tablet, Rfl: 3   Continuous Blood Gluc Receiver ([place]) DEVI, Use to check blood sugars, Disp: 3 each, Rfl: 3   Continuous Blood Gluc Sensor (DEXCOM G7 SENSOR) MISC, USE TO CHECK BLOOD SUGARS, Disp: 3 each, Rfl: 2   cyanocobalamin 1000 MCG tablet, TAKE ONE TABLET BY MOUTH DAILY, Disp: 30 tablet, Rfl: 2   DULoxetine (CYMBALTA) 30 MG capsule, Take 1 capsule (30 mg  total) by mouth daily., Disp: 30 capsule, Rfl: 3   empagliflozin (JARDIANCE) 25 MG TABS tablet, Take 1 tablet (25 mg total) by mouth daily before breakfast., Disp: 90 tablet, Rfl: 1   hydrALAZINE (APRESOLINE) 50 MG tablet, Take 1 tablet (50 mg total) by mouth 3 (three) times daily., Disp: 90 tablet, Rfl: 6   hydrocortisone cream 1 %, Apply 1 Application topically 2 (two) times daily as needed for itching., Disp: 45 g, Rfl: 1   isosorbide mononitrate (IMDUR) 30 MG 24 hr tablet, Take 1 tablet (30 mg total) by mouth daily., Disp: 30 tablet, Rfl: 6   sacubitril-valsartan (ENTRESTO) 24-26 MG, Take 1 tablet by mouth 2 (two) times daily., Disp: 60 tablet, Rfl: 3   spironolactone (ALDACTONE) 25 MG tablet, Take 1 tablet (25 mg total) by mouth daily., Disp: 30 tablet, Rfl: 11   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 (Patient not taking: Reported on 07/01/2022), Disp: , Rfl:    empagliflozin (JARDIANCE) 10 MG TABS tablet, Take 1 tablet (10 mg total) by mouth daily., Disp: 30 tablet, Rfl: 3   ondansetron (ZOFRAN) 4 MG tablet, Take 1 tablet (4 mg total) by mouth every 8 (eight) hours as needed for nausea or vomiting. (Patient not taking: Reported on 07/01/2022), Disp: 12 tablet, Rfl: 0 Allergies  Allergen Reactions   Bee Venom Anaphylaxis, Swelling and Other (See Comments)    Swells all over     Social History   Socioeconomic History   Marital status: Single    Spouse name: Not on file   Number of children: Not on file   Years of education: Not on file   Highest education level: Not on file  Occupational History   Not on file  Tobacco Use   Smoking status: Former    Types: Cigarettes    Quit date: 10/06/2017    Years since quitting: 4.7   Smokeless  tobacco: Never  Substance and Sexual Activity   Alcohol use: Yes    Comment: Occasional Beer   Drug use: Yes    Types: Marijuana   Sexual activity: Not on file  Other Topics Concern   Not on file  Social History Narrative   Right Handed.    Lives in a two story home    Lives with 30 year old son.    Social Determinants of Health   Financial Resource Strain: Low Risk  (01/18/2022)   Overall Financial Resource Strain (CARDIA)    Difficulty of Paying Living Expenses: Not hard at all  Food Insecurity: No Food Insecurity (06/04/2022)   Hunger Vital Sign    Worried About Running Out of Food in the Last Year: Never true    Ran Out of Food  in the Last Year: Never true  Transportation Needs: No Transportation Needs (06/04/2022)   PRAPARE - Hydrologist (Medical): No    Lack of Transportation (Non-Medical): No  Physical Activity: Inactive (01/18/2022)   Exercise Vital Sign    Days of Exercise per Week: 0 days    Minutes of Exercise per Session: 0 min  Stress: Stress Concern Present (01/18/2022)   [place]    Feeling of Stress : To some extent  Social Connections: Socially Isolated (01/18/2022)   Social Connection and Isolation Panel [NHANES]    Frequency of Communication with Friends and Family: More than three times a week    Frequency of Social Gatherings with Friends and Family: More than three times a week    Attends Religious Services: Never    Marine scientist or Organizations: No    Attends Archivist Meetings: Never    Marital Status: Never married  Intimate Partner Violence: Not At Risk (01/18/2022)   Humiliation, Afraid, Rape, and Kick questionnaire    Fear of Current or Ex-Partner: No    Emotionally Abused: No    Physically Abused: No    Sexually Abused: No    Physical Exam      Future Appointments  Date Time Provider [place]  09/24/2022 11:00 AM  [name], [name], MD [facility]  09/27/2022  9:30 AM El-Khouri, Gerarda Gunther, RD [place] NDM  10/25/2022  1:30 PM Narda Amber K, DO [facility] None

## 2022-07-07 ENCOUNTER — Other Ambulatory Visit (HOSPITAL_COMMUNITY): Payer: Self-pay | Admitting: Emergency Medicine

## 2022-07-07 NOTE — Progress Notes (Signed)
Paramedicine Encounter    Patient ID: Thomas West, male    DOB: [birth year], 62 y.o.   MRN: [id number]   Complaints losing teeth  Assessment No chest pain, no SOB, no edema.  Lung sounds clear and equal  Compliance with meds 100%  Pill box filled x 1 week  Refills needed Clopidogrel, ASA, B12  Meds changes since last visit none    Social changes  NONE   BP 120/80 (BP Location: Left Arm, Patient Position: Sitting, Cuff Size: Normal)   Pulse 80   Resp 16   Wt 161 lb 3.2 oz (73.1 kg)   SpO2 96%   BMI 23.81 kg/m  Weight yesterday-not taken Last visit weight-164lb  Refills called in to [facility] to be delivered tomorrow.  Assisted [name] with getting an appointment with a dentist for tomorrow @ 8:30.  He is having issues with a crown falling out and losing with a front tooth.  He says the lack of teeth is greatly effecting his ability to eat.     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   Pruritic rash 05/25/2022   Polyneuropathy associated with underlying disease ([place]) 01/15/2022   Atherosclerosis of aorta ([place]) 01/15/2022   History of CVA (cerebrovascular accident) without residual deficits 12/24/2021   AKI (acute kidney injury) ([place]) 12/13/2021   Diarrhea 12/13/2021   Shortness of breath 12/13/2021   Chest pain 12/13/2021   Nausea and vomiting 12/13/2021   Heart failure ([place]) 12/08/2021   Hypertension associated with diabetes ([place]) 07/12/2021   Chronic kidney disease, stage 3a ([place]) 07/12/2021   Hyperkalemia 07/12/2021   CHF exacerbation ([place]) 04/20/2021   Acute on chronic combined systolic and diastolic CHF (congestive heart failure)  ([place]) 03/09/2021   Acute on chronic HFrEF (heart failure with reduced ejection fraction) ([place]) 03/08/2021   HFrEF (heart failure with reduced ejection fraction) ([place]) 02/03/2021   Diabetes mellitus ([place]) 03/27/2019   Hyperlipidemia associated with type 2 diabetes mellitus ([place]) 03/27/2019   CAD (coronary artery disease) 03/05/2019   History of MI (myocardial infarction) 03/05/2019   Type 2 diabetes mellitus with diabetic neuropathy, unspecified ([place]) 03/05/2019   HLD (hyperlipidemia) 03/05/2019   GERD (gastroesophageal reflux disease) 03/05/2019   Hypertension, essential, benign 03/05/2019    Current Outpatient Medications:    aspirin EC 81 MG tablet, Take 1 tablet (81 mg total) by mouth daily. Swallow whole., Disp: 30 tablet, Rfl: 12   atorvastatin (LIPITOR) 80 MG tablet, Take 1 tablet (80 mg total) by mouth daily., Disp: 30 tablet, Rfl: 6   carvedilol (COREG) 6.25 MG tablet, Take 1 tablet (6.25 mg total) by mouth EVERY 12 HOURS (10AM &10PM), Disp: 60 tablet, Rfl: 6   clopidogrel (PLAVIX) 75 MG tablet, Take 1 tablet (75 mg total) by mouth daily., Disp: 30 tablet, Rfl: 3   Continuous Blood Gluc Receiver ([place]) DEVI, Use to check blood sugars, Disp: 3 each, Rfl: 3   Continuous Blood Gluc Sensor (DEXCOM G7 SENSOR) MISC, USE TO CHECK BLOOD SUGARS, Disp: 3 each, Rfl: 2   cyanocobalamin (VITAMIN B12) 1000 MCG tablet, Take 1,000 mcg by mouth daily., Disp: , Rfl:    cyanocobalamin 1000 MCG tablet, TAKE ONE TABLET BY  MOUTH DAILY, Disp: 30 tablet, Rfl: 2   DULoxetine (CYMBALTA) 30 MG capsule, Take 1 capsule (30 mg total) by mouth daily., Disp: 30 capsule, Rfl: 3   empagliflozin (JARDIANCE) 25 MG TABS tablet, Take 1 tablet (25 mg total) by mouth daily before breakfast., Disp: 90 tablet, Rfl: 1   hydrALAZINE (APRESOLINE) 50 MG tablet, Take 1 tablet (50 mg total) by mouth 3 (three) times daily., Disp: 90 tablet, Rfl: 6   hydrocortisone cream 1 %, Apply 1 Application topically 2 (two) times  daily as needed for itching., Disp: 45 g, Rfl: 1   isosorbide mononitrate (IMDUR) 30 MG 24 hr tablet, Take 1 tablet (30 mg total) by mouth daily., Disp: 30 tablet, Rfl: 6   sacubitril-valsartan (ENTRESTO) 24-26 MG, Take 1 tablet by mouth 2 (two) times daily., Disp: 60 tablet, Rfl: 3   spironolactone (ALDACTONE) 25 MG tablet, Take 1 tablet (25 mg total) by mouth daily., Disp: 30 tablet, Rfl: 11   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 (Patient not taking: Reported on 07/07/2022), Disp: 30 tablet, Rfl: 3   ondansetron (ZOFRAN) 4 MG tablet, Take 1 tablet (4 mg total) by mouth every 8 (eight) hours as needed for nausea or vomiting. (Patient not taking: Reported on 07/01/2022), Disp: 12 tablet, Rfl: 0 Allergies  Allergen Reactions   Bee Venom Anaphylaxis, Swelling and Other (See Comments)    Swells all over     Social History   Socioeconomic History   Marital status: Single    Spouse name: Not on file   Number of children: Not on file   Years of education: Not on file   Highest education level: Not on file  Occupational History   Not on file  Tobacco Use   Smoking status: Former    Types: Cigarettes    Quit date: 10/06/2017    Years since quitting: 4.7   Smokeless tobacco: Never  Substance and Sexual Activity   Alcohol use: Yes    Comment: Occasional Beer   Drug use: Yes    Types: Marijuana   Sexual activity: Not on file  Other Topics Concern   Not on file  Social History Narrative   Right Handed.    Lives in a two story home    Lives with 58 year old son.    Social Determinants of Health   Financial Resource Strain: Low Risk  (01/18/2022)   Overall Financial Resource Strain (CARDIA)    Difficulty of Paying Living  Expenses: Not hard at all  Food Insecurity: No Food Insecurity (06/04/2022)   Hunger Vital Sign    Worried About Running Out of Food in the Last Year: Never true    Ran Out of Food in the Last Year: Never true  Transportation Needs: No Transportation Needs (06/04/2022)   PRAPARE - Hydrologist (Medical): No    Lack of Transportation (Non-Medical): No  Physical Activity: Inactive (01/18/2022)   Exercise Vital Sign    Days of Exercise per Week: 0 days    Minutes of Exercise per Session: 0 min  Stress: Stress Concern Present (01/18/2022)   [place]    Feeling  of Stress : To some extent  Social Connections: Socially Isolated (01/18/2022)   Social Connection and Isolation Panel [NHANES]    Frequency of Communication with Friends and Family: More than three times a week    Frequency of Social Gatherings with Friends and Family: More than three times a week    Attends Religious Services: Never    Marine scientist or Organizations: No    Attends Archivist Meetings: Never    Marital Status: Never married  Intimate Partner Violence: Not At Risk (01/18/2022)   Humiliation, Afraid, Rape, and Kick questionnaire    Fear of Current or Ex-Partner: No    Emotionally Abused: No    Physically Abused: No    Sexually Abused: No    Physical Exam      Future Appointments  Date Time Provider [place]  09/24/2022 11:00 AM [name], [name], MD [facility]  09/27/2022  9:30 AM El-Khouri, Gerarda Gunther, RD [place] NDM  10/25/2022  1:30 PM Narda Amber K, DO [facility] None

## 2022-07-13 ENCOUNTER — Other Ambulatory Visit (HOSPITAL_COMMUNITY): Payer: Self-pay

## 2022-07-14 ENCOUNTER — Other Ambulatory Visit (HOSPITAL_COMMUNITY): Payer: Self-pay | Admitting: Emergency Medicine

## 2022-07-14 ENCOUNTER — Other Ambulatory Visit (HOSPITAL_COMMUNITY): Payer: Self-pay

## 2022-07-14 NOTE — Progress Notes (Signed)
Paramedicine Encounter    Patient ID: Thomas West, male    DOB: Nov 12, 1960, 62 y.o.   MRN: [id number]   Complaints None  Assessment No chest pain, SOB.  No edema, Lung sounds w/ some mild expiratory wheezing in upper rt lobe, all other lobes unremarkable.  Compliance with meds 100%  Pill box filled x 1 week  Refills needed Hydralazine- called into [facility] and to be mailed  Meds changes since last visit NONE    Social changes NONE   BP (!) 140/90 (BP Location: Left Arm, Patient Position: Sitting, Cuff Size: Normal)   Pulse 70   Resp 14   Wt 157 lb 6.4 oz (71.4 kg)   SpO2 97%   BMI 23.24 kg/m  Weight yesterday-not taken Last visit weight-161lb  For the first time pt has been 100% compliant with all his medications.  Historically he misses his noon dose of Hydralazine.  He reports to be feeling good and says he's really working to be more diligent with his meds.  He recently had to see a dentist as he lost a crown, cracked a tooth and had a couple other teeth lost.  He received a quote for the dental work  to be right at $5,000 out of pocket and is concerned about affording same.  He says he's going to look at other dental offices and see how much they will be.  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   Pruritic rash 05/25/2022   Polyneuropathy associated with underlying disease ([place]) 01/15/2022   Atherosclerosis of aorta ([place]) 01/15/2022   History of CVA (cerebrovascular accident) without residual deficits 12/24/2021   AKI (acute kidney injury) ([place]) 12/13/2021   Diarrhea 12/13/2021   Shortness of breath 12/13/2021   Chest pain 12/13/2021    Nausea and vomiting 12/13/2021   Heart failure ([place]) 12/08/2021   Hypertension associated with diabetes ([place]) 07/12/2021   Chronic kidney disease, stage 3a ([place]) 07/12/2021   Hyperkalemia 07/12/2021   CHF exacerbation ([place]) 04/20/2021   Acute on chronic combined systolic and diastolic CHF (congestive heart failure) ([place]) 03/09/2021   Acute on chronic HFrEF (heart failure with reduced ejection fraction) ([place]) 03/08/2021   HFrEF (heart failure with reduced ejection fraction) ([place]) 02/03/2021   Diabetes mellitus ([place]) 03/27/2019   Hyperlipidemia associated with type 2 diabetes mellitus ([place]) 03/27/2019   CAD (coronary artery disease) 03/05/2019   History of MI (myocardial infarction) 03/05/2019   Type 2 diabetes mellitus with diabetic neuropathy, unspecified ([place]) 03/05/2019   HLD (hyperlipidemia) 03/05/2019   GERD (gastroesophageal reflux disease) 03/05/2019   Hypertension, essential, benign 03/05/2019    Current Outpatient Medications:    aspirin EC 81 MG tablet, Take 1 tablet (81 mg total) by mouth daily. Swallow whole., Disp: 30 tablet, Rfl: 12   atorvastatin (LIPITOR) 80 MG tablet, Take 1 tablet (80 mg total) by mouth daily., Disp: 30 tablet, Rfl: 6   carvedilol (COREG) 6.25 MG tablet, Take 1 tablet (6.25 mg total) by mouth EVERY 12 HOURS (10AM &10PM), Disp: 60 tablet, Rfl: 6   clopidogrel (PLAVIX) 75 MG tablet, Take 1 tablet (75 mg total) by mouth daily., Disp: 30 tablet, Rfl: 3   Continuous Blood Gluc Receiver (  DEXCOM G7 RECEIVER) DEVI, Use to check blood sugars, Disp: 3 each, Rfl: 3   Continuous Blood Gluc Sensor (DEXCOM G7 SENSOR) MISC, USE TO CHECK BLOOD SUGARS, Disp: 3 each, Rfl: 2   cyanocobalamin 1000 MCG tablet, TAKE ONE TABLET BY MOUTH DAILY, Disp: 30 tablet, Rfl: 2   DULoxetine (CYMBALTA) 30 MG capsule, Take 1 capsule (30 mg total) by mouth daily., Disp: 30 capsule, Rfl: 3   empagliflozin (JARDIANCE) 10 MG TABS tablet, Take 1 tablet (10 mg total) by mouth daily., Disp: 30  tablet, Rfl: 3   hydrocortisone cream 1 %, Apply 1 Application topically 2 (two) times daily as needed for itching., Disp: 45 g, Rfl: 1   isosorbide mononitrate (IMDUR) 30 MG 24 hr tablet, Take 1 tablet (30 mg total) by mouth daily., Disp: 30 tablet, Rfl: 6   sacubitril-valsartan (ENTRESTO) 24-26 MG, Take 1 tablet by mouth 2 (two) times daily., Disp: 60 tablet, Rfl: 3   spironolactone (ALDACTONE) 25 MG tablet, Take 1 tablet (25 mg total) by mouth daily., Disp: 30 tablet, Rfl: 11   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, Disp: , Rfl:    empagliflozin (JARDIANCE) 25 MG TABS tablet, Take 1 tablet (25 mg total) by mouth daily before breakfast. (Patient not taking: Reported on 07/14/2022), Disp: 90 tablet, Rfl: 1   hydrALAZINE (APRESOLINE) 50 MG tablet, Take 1 tablet (50 mg total) by mouth 3 (three) times daily., Disp: 90 tablet, Rfl: 6   ondansetron (ZOFRAN) 4 MG tablet, Take 1 tablet (4 mg total) by mouth every 8 (eight) hours as needed for nausea or vomiting., Disp: 12 tablet, Rfl: 0 Allergies  Allergen Reactions   Bee Venom Anaphylaxis, Swelling and Other (See Comments)    Swells all over     Social History   Socioeconomic History   Marital status: Single    Spouse name: Not on file   Number of children: Not on file   Years of education: Not on file   Highest education level: Not on file  Occupational History   Not on file  Tobacco Use   Smoking status: Former    Types: Cigarettes    Quit date: 10/06/2017    Years since quitting: 4.7   Smokeless tobacco: Never  Substance and Sexual Activity   Alcohol use: Yes    Comment: Occasional Beer   Drug use: Yes    Types: Marijuana   Sexual activity: Not on file  Other Topics Concern    Not on file  Social History Narrative   Right Handed.    Lives in a two story home    Lives with 47 year old son.    Social Determinants of Health   Financial Resource Strain: Low Risk  (01/18/2022)   Overall Financial Resource Strain (CARDIA)    Difficulty of Paying Living Expenses: Not hard at all  Food Insecurity: No Food Insecurity (06/04/2022)   Hunger Vital Sign    Worried About Running Out of Food in the Last Year: Never true    Ran Out of Food in the Last Year: Never true  Transportation Needs: No Transportation Needs (06/04/2022)   PRAPARE - Hydrologist (Medical): No  Lack of Transportation (Non-Medical): No  Physical Activity: Inactive (01/18/2022)   Exercise Vital Sign    Days of Exercise per Week: 0 days    Minutes of Exercise per Session: 0 min  Stress: Stress Concern Present (01/18/2022)   [place]    Feeling of Stress : To some extent  Social Connections: Socially Isolated (01/18/2022)   Social Connection and Isolation Panel [NHANES]    Frequency of Communication with Friends and Family: More than three times a week    Frequency of Social Gatherings with Friends and Family: More than three times a week    Attends Religious Services: Never    Marine scientist or Organizations: No    Attends Archivist Meetings: Never    Marital Status: Never married  Intimate Partner Violence: Not At Risk (01/18/2022)   Humiliation, Afraid, Rape, and Kick questionnaire    Fear of Current or Ex-Partner: No    Emotionally Abused: No    Physically Abused: No    Sexually Abused: No    Physical Exam      Future Appointments  Date Time Provider [place]  09/24/2022 11:00 AM [name], [name], MD [facility]  09/27/2022  9:30 AM El-Khouri, Gerarda Gunther, RD [place] NDM  10/25/2022  1:30 PM Narda Amber K, DO [facility] None

## 2022-07-16 ENCOUNTER — Other Ambulatory Visit (HOSPITAL_COMMUNITY): Payer: Self-pay

## 2022-07-19 ENCOUNTER — Other Ambulatory Visit (HOSPITAL_COMMUNITY): Payer: Self-pay

## 2022-07-19 ENCOUNTER — Telehealth (HOSPITAL_COMMUNITY): Payer: Self-pay | Admitting: Emergency Medicine

## 2022-07-19 MED ORDER — EMPAGLIFLOZIN 25 MG PO TABS
ORAL_TABLET | ORAL | 0 refills | Status: DC
  Filled 2022-07-19 – 2022-08-02 (×2): qty 90, 90d supply, fill #0

## 2022-07-19 NOTE — Telephone Encounter (Signed)
Called to get Jardiance '25mg'$ . Transferred from [facility].  Will be delivered tomorrow.    Renee Ramus, [place] 07/19/2022

## 2022-07-20 ENCOUNTER — Other Ambulatory Visit (HOSPITAL_COMMUNITY): Payer: Self-pay

## 2022-07-20 ENCOUNTER — Other Ambulatory Visit (HOSPITAL_COMMUNITY): Payer: Self-pay | Admitting: Emergency Medicine

## 2022-07-20 ENCOUNTER — Other Ambulatory Visit: Payer: Self-pay

## 2022-07-20 NOTE — Progress Notes (Signed)
Paramedicine Encounter    Patient ID: Thomas West, male    DOB: [birth year], 62 y.o.   MRN: [id number]   Complaints NONE  Assessment No chest pain, no SOB. Lung sounds clear and equal throughout. No edema  Compliance with meds Not taking noon dose of hydralazine  Pill box filled x 1 week  Refills needed Duloxetine, Jardiance '25mg'$   Meds changes since last visit   NONE    Social changes NONE   BP 122/80 (BP Location: Left Arm, Patient Position: Sitting, Cuff Size: Normal)   Pulse 76   Resp 16   Wt 159 lb (72.1 kg)   SpO2 96%   BMI 23.48 kg/m  Weight yesterday-not taken Last visit weight-157lb Cbg 145 ACTION: Home visit completed  [name] [id number] 07/20/22  Patient Care Team: [name], [name], MD as PCP - General (Family Medicine) Bensimhon, Shaune Pascal, MD as PCP - Cardiology (Cardiology) Alda Berthold, DO as Consulting Physician (Neurology)  Patient Active Problem List   Diagnosis Date Noted   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) 01/15/2022   Atherosclerosis of aorta ([place]) 01/15/2022   History of CVA (cerebrovascular accident) without residual deficits 12/24/2021   AKI (acute kidney injury) ([place]) 12/13/2021   Diarrhea 12/13/2021   Shortness of breath 12/13/2021   Chest pain 12/13/2021   Nausea and vomiting 12/13/2021   Heart failure ([place]) 12/08/2021   Hypertension associated with diabetes ([place]) 07/12/2021   Chronic kidney disease, stage 3a ([place]) 07/12/2021   Hyperkalemia 07/12/2021   CHF exacerbation ([place]) 04/20/2021   Acute on chronic combined systolic and diastolic CHF (congestive heart failure) ([place]) 03/09/2021   Acute on chronic HFrEF (heart failure with reduced ejection fraction) ([place]) 03/08/2021   HFrEF (heart failure with reduced ejection fraction) ([place]) 02/03/2021   Diabetes mellitus ([place]) 03/27/2019   Hyperlipidemia associated with type 2  diabetes mellitus ([place]) 03/27/2019   CAD (coronary artery disease) 03/05/2019   History of MI (myocardial infarction) 03/05/2019   Type 2 diabetes mellitus with diabetic neuropathy, unspecified ([place]) 03/05/2019   HLD (hyperlipidemia) 03/05/2019   GERD (gastroesophageal reflux disease) 03/05/2019   Hypertension, essential, benign 03/05/2019    Current Outpatient Medications:    aspirin EC 81 MG tablet, Take 1 tablet (81 mg total) by mouth daily. Swallow whole., Disp: 30 tablet, Rfl: 12   atorvastatin (LIPITOR) 80 MG tablet, Take 1 tablet (80 mg total) by mouth daily., Disp: 30 tablet, Rfl: 6   carvedilol (COREG) 6.25 MG tablet, Take 1 tablet (6.25 mg total) by mouth EVERY 12 HOURS (10AM &10PM), Disp: 60 tablet, Rfl: 6   clopidogrel (PLAVIX) 75 MG tablet, Take 1 tablet (75 mg total) by mouth daily., Disp: 30 tablet, Rfl: 3   Continuous Blood Gluc Receiver ([place]) DEVI, Use to check blood sugars, Disp: 3 each, Rfl: 3   Continuous Blood Gluc Sensor (DEXCOM G7 SENSOR) MISC, USE TO CHECK BLOOD SUGARS, Disp: 3 each, Rfl: 2   cyanocobalamin (VITAMIN B12) 1000 MCG tablet, Take 1,000 mcg by mouth daily., Disp: , Rfl:    cyanocobalamin 1000 MCG tablet, TAKE ONE TABLET BY MOUTH DAILY, Disp: 30 tablet, Rfl: 2   DULoxetine (CYMBALTA) 30 MG capsule, Take 1 capsule (30 mg total) by mouth daily., Disp: 30 capsule, Rfl: 3   empagliflozin (JARDIANCE) 25 MG TABS tablet, Take 1 tablet by mouth once daily before breakfast, Disp: 90 tablet, Rfl: 0   hydrALAZINE (APRESOLINE) 50 MG  tablet, Take 1 tablet (50 mg total) by mouth 3 (three) times daily., Disp: 90 tablet, Rfl: 6   hydrocortisone cream 1 %, Apply 1 Application topically 2 (two) times daily as needed for itching., Disp: 45 g, Rfl: 1   isosorbide mononitrate (IMDUR) 30 MG 24 hr tablet, Take 1 tablet (30 mg total) by mouth daily., Disp: 30 tablet, Rfl: 6   sacubitril-valsartan (ENTRESTO) 24-26 MG, Take 1 tablet by mouth 2 (two) times daily., Disp:  60 tablet, Rfl: 3   spironolactone (ALDACTONE) 25 MG tablet, Take 1 tablet (25 mg total) by mouth daily., Disp: 30 tablet, Rfl: 11   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 (Patient not taking: Reported on 07/20/2022), Disp: 30 tablet, Rfl: 3   empagliflozin (JARDIANCE) 25 MG TABS tablet, Take 1 tablet (25 mg total) by mouth daily before breakfast. (Patient not taking: Reported on 07/20/2022), Disp: 90 tablet, Rfl: 1   ondansetron (ZOFRAN) 4 MG tablet, Take 1 tablet (4 mg total) by mouth every 8 (eight) hours as needed for nausea or vomiting. (Patient not taking: Reported on 07/20/2022), Disp: 12 tablet, Rfl: 0 Allergies  Allergen Reactions   Bee Venom Anaphylaxis, Swelling and Other (See Comments)    Swells all over     Social History   Socioeconomic History   Marital status: Single    Spouse name: Not on file   Number of children: Not on file   Years of education: Not on file   Highest education level: Not on file  Occupational History   Not on file  Tobacco Use   Smoking status: Former    Types: Cigarettes    Quit date: 10/06/2017    Years since quitting: 4.7   Smokeless tobacco: Never  Substance and Sexual Activity   Alcohol use: Yes    Comment: Occasional Beer   Drug use: Yes    Types: Marijuana   Sexual activity: Not on file  Other Topics Concern   Not on file  Social History Narrative   Right Handed.    Lives in a two story home    Lives with 34 year old son.    Social Determinants of Health   Financial Resource Strain: Low Risk  (01/18/2022)   Overall Financial Resource Strain (CARDIA)    Difficulty of Paying Living Expenses: Not hard at all  Food Insecurity: No Food Insecurity (06/04/2022)   Hunger Vital  Sign    Worried About Running Out of Food in the Last Year: Never true    Ran Out of Food in the Last Year: Never true  Transportation Needs: No Transportation Needs (06/04/2022)   PRAPARE - Hydrologist (Medical): No    Lack of Transportation (Non-Medical): No  Physical Activity: Inactive (01/18/2022)   Exercise Vital Sign    Days of Exercise per Week: 0 days    Minutes of Exercise per Session: 0 min  Stress: Stress Concern Present (01/18/2022)   [place]    Feeling of Stress : To some extent  Social Connections: Socially Isolated (01/18/2022)   Social Connection and Isolation Panel [NHANES]    Frequency of Communication with Friends  and Family: More than three times a week    Frequency of Social Gatherings with Friends and Family: More than three times a week    Attends Religious Services: Never    Marine scientist or Organizations: No    Attends Archivist Meetings: Never    Marital Status: Never married  Intimate Partner Violence: Not At Risk (01/18/2022)   Humiliation, Afraid, Rape, and Kick questionnaire    Fear of Current or Ex-Partner: No    Emotionally Abused: No    Physically Abused: No    Sexually Abused: No    Physical Exam      Future Appointments  Date Time Provider [place]  09/24/2022 11:00 AM [name], [name], MD [facility]  09/27/2022  9:30 AM El-Khouri, Gerarda Gunther, RD [place] NDM  10/25/2022  1:30 PM Narda Amber K, DO [facility] None

## 2022-07-22 ENCOUNTER — Other Ambulatory Visit (HOSPITAL_COMMUNITY): Payer: Self-pay

## 2022-07-26 ENCOUNTER — Other Ambulatory Visit: Payer: Self-pay

## 2022-07-26 DIAGNOSIS — E1140 Type 2 diabetes mellitus with diabetic neuropathy, unspecified: Secondary | ICD-10-CM

## 2022-07-26 DIAGNOSIS — I10 Essential (primary) hypertension: Secondary | ICD-10-CM

## 2022-07-28 ENCOUNTER — Other Ambulatory Visit (HOSPITAL_COMMUNITY): Payer: Self-pay | Admitting: Emergency Medicine

## 2022-07-28 ENCOUNTER — Other Ambulatory Visit (HOSPITAL_COMMUNITY): Payer: Self-pay

## 2022-07-28 NOTE — Progress Notes (Signed)
Paramedicine Encounter    Patient ID: Thomas West, male    DOB: [birth year], 62 y.o.   MRN: [id number]   Complaints Sneezing & runny nose  Assessment No chest pain or SOB.  No edema.  Lung sounds clear throughout  Compliance with meds <50% compliant- missed 1 a.m. dose, missed all noon doses of hydralazine and missed 4 p.m. doses  Pill box filled  1 week  Refills needed Jardiance and Spiro  Meds changes since last visit NONE    Social changes NONE   Pt has not done well with his med compliance this past week and states it's been because he's been "sick".  He states he has has sneezing and runny nose.  He states he thinks it's a cold or allergies.  He is afebrile.  Blood pressure elevated possibly due to non-compliance.  Encouraged him to get back on track with his meds and advised he would do so.  Med box reconciled x 1 week.  Refills called in and will be delivered first of the week.    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   Pruritic rash 05/25/2022   Polyneuropathy associated with underlying disease ([place]) 01/15/2022   Atherosclerosis of aorta ([place]) 01/15/2022   History of CVA (cerebrovascular accident) without residual deficits 12/24/2021   AKI (acute kidney injury) ([place]) 12/13/2021   Diarrhea 12/13/2021   Shortness of breath 12/13/2021   Chest pain 12/13/2021   Nausea and vomiting 12/13/2021   Heart failure ([place]) 12/08/2021   Hypertension associated with diabetes ([place]) 07/12/2021   Chronic kidney disease, stage 3a ([place]) 07/12/2021   Hyperkalemia 07/12/2021    CHF exacerbation ([place]) 04/20/2021   Acute on chronic combined systolic and diastolic CHF (congestive heart failure) ([place]) 03/09/2021   Acute on chronic HFrEF (heart failure with reduced ejection fraction) ([place]) 03/08/2021   HFrEF (heart failure with reduced ejection fraction) ([place]) 02/03/2021   Diabetes mellitus ([place]) 03/27/2019   Hyperlipidemia associated with type 2 diabetes mellitus ([place]) 03/27/2019   CAD (coronary artery disease) 03/05/2019   History of MI (myocardial infarction) 03/05/2019   Type 2 diabetes mellitus with diabetic neuropathy, unspecified ([place]) 03/05/2019   HLD (hyperlipidemia) 03/05/2019   GERD (gastroesophageal reflux disease) 03/05/2019   Hypertension, essential, benign 03/05/2019    Current Outpatient Medications:    aspirin EC 81 MG tablet, Take 1 tablet (81 mg total) by mouth daily. Swallow whole., Disp: 30 tablet, Rfl: 12   atorvastatin (LIPITOR) 80 MG tablet, Take 1 tablet (80 mg total) by mouth daily., Disp: 30 tablet, Rfl: 6   carvedilol (COREG) 6.25 MG tablet, Take 1 tablet (6.25 mg total) by mouth EVERY 12 HOURS (10AM &10PM), Disp: 60 tablet, Rfl: 6   clopidogrel (PLAVIX) 75 MG tablet, Take 1 tablet (75 mg total) by mouth daily., Disp: 30 tablet, Rfl: 3   Continuous Blood Gluc Receiver ([place]) DEVI, Use to check blood sugars, Disp: 3 each, Rfl: 3   Continuous Blood Gluc Sensor (DEXCOM G7 SENSOR) MISC, USE TO CHECK BLOOD SUGARS, Disp: 3 each, Rfl: 2   cyanocobalamin (VITAMIN B12) 1000 MCG tablet,  Take 1,000 mcg by mouth daily., Disp: , Rfl:    cyanocobalamin 1000 MCG tablet, TAKE ONE TABLET BY MOUTH DAILY, Disp: 30 tablet, Rfl: 2   DULoxetine (CYMBALTA) 30 MG capsule, Take 1 capsule (30 mg total) by mouth daily., Disp: 30 capsule, Rfl: 3   empagliflozin (JARDIANCE) 25 MG TABS tablet, Take 1 tablet (25 mg total) by mouth daily before breakfast., Disp: 90 tablet, Rfl: 1   hydrALAZINE (APRESOLINE) 50 MG tablet, Take 1 tablet (50 mg total) by mouth  3 (three) times daily., Disp: 90 tablet, Rfl: 6   hydrocortisone cream 1 %, Apply 1 Application topically 2 (two) times daily as needed for itching., Disp: 45 g, Rfl: 1   isosorbide mononitrate (IMDUR) 30 MG 24 hr tablet, Take 1 tablet (30 mg total) by mouth daily., Disp: 30 tablet, Rfl: 6   sacubitril-valsartan (ENTRESTO) 24-26 MG, Take 1 tablet by mouth 2 (two) times daily., Disp: 60 tablet, Rfl: 3   spironolactone (ALDACTONE) 25 MG tablet, Take 1 tablet (25 mg total) by mouth daily., Disp: 30 tablet, Rfl: 11   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 (Patient not taking: Reported on 07/20/2022), Disp: 30 tablet, Rfl: 3   empagliflozin (JARDIANCE) 25 MG TABS tablet, Take 1 tablet by mouth once daily before breakfast, Disp: 90 tablet, Rfl: 0   ondansetron (ZOFRAN) 4 MG tablet, Take 1 tablet (4 mg total) by mouth every 8 (eight) hours as needed for nausea or vomiting. (Patient not taking: Reported on 07/20/2022), Disp: 12 tablet, Rfl: 0 Allergies  Allergen Reactions   Bee Venom Anaphylaxis, Swelling and Other (See Comments)    Swells all over     Social History   Socioeconomic History   Marital status: Single    Spouse name: Not on file   Number of children: Not on file   Years of education: Not on file   Highest education level: Not on file  Occupational History   Not on file  Tobacco Use   Smoking status: Former    Types: Cigarettes    Quit date: 10/06/2017    Years since quitting: 4.8   Smokeless tobacco: Never  Substance and Sexual Activity   Alcohol use: Yes    Comment: Occasional Beer   Drug use: Yes    Types: Marijuana   Sexual activity: Not on file  Other Topics Concern   Not on file  Social History Narrative    Right Handed.    Lives in a two story home    Lives with 104 year old son.    Social Determinants of Health   Financial Resource Strain: Low Risk  (01/18/2022)   Overall Financial Resource Strain (CARDIA)    Difficulty of Paying Living Expenses: Not hard at all  Food Insecurity: No Food Insecurity (06/04/2022)   Hunger Vital Sign    Worried About Running Out of Food in the Last Year: Never true    Ran Out of Food in the Last Year: Never true  Transportation Needs: No Transportation Needs (06/04/2022)   PRAPARE - Hydrologist (Medical): No    Lack of Transportation (Non-Medical): No  Physical Activity: Inactive (01/18/2022)   Exercise Vital  Sign    Days of Exercise per Week: 0 days    Minutes of Exercise per Session: 0 min  Stress: Stress Concern Present (01/18/2022)   [place]    Feeling of Stress : To some extent  Social Connections: Socially Isolated (01/18/2022)   Social Connection and Isolation Panel [NHANES]    Frequency of Communication with Friends and Family: More than three times a week    Frequency of Social Gatherings with Friends and Family: More than three times a week    Attends Religious Services: Never    Marine scientist or Organizations: No    Attends Archivist Meetings: Never    Marital Status: Never married  Intimate Partner Violence: Not At Risk (01/18/2022)   Humiliation, Afraid, Rape, and Kick questionnaire    Fear of Current or Ex-Partner: No    Emotionally Abused: No    Physically Abused: No    Sexually Abused: No    Physical Exam      Future Appointments  Date Time Provider [place]  08/10/2022 10:00 AM Maren Reamer, [place] None  09/24/2022 11:00 AM [name], [name], MD [facility]  09/27/2022  9:30 AM El-Khouri, Gerarda Gunther, RD [place] NDM  10/25/2022  1:30 PM Narda Amber K, DO [facility] None

## 2022-08-02 ENCOUNTER — Other Ambulatory Visit (HOSPITAL_COMMUNITY): Payer: Self-pay

## 2022-08-02 ENCOUNTER — Other Ambulatory Visit: Payer: Self-pay

## 2022-08-05 ENCOUNTER — Other Ambulatory Visit (HOSPITAL_COMMUNITY): Payer: Self-pay

## 2022-08-05 ENCOUNTER — Other Ambulatory Visit (HOSPITAL_COMMUNITY): Payer: Self-pay | Admitting: Emergency Medicine

## 2022-08-05 NOTE — Progress Notes (Signed)
Paramedicine Encounter    Patient ID: Thomas West, male    DOB: [birth year], 62 y.o.   MRN: [id number]   Complaints NONE  Assessment no chest pain, no SOB.  Lung sounds clear bilat & no edema noted  Compliance with meds 100%  Pill box filled x 1 week  Refills needed Clopidogrel, Torsemide  Meds changes since last visit none    Social changes NONE   BP 110/70 (BP Location: Left Arm, Patient Position: Sitting, Cuff Size: Normal)   Pulse 92   Resp 16   Wt 156 lb (70.8 kg)   SpO2 96%   BMI 23.04 kg/m  Weight yesterday-not taken Last visit weight-158lb  Thomas West states, "I feel amazing."  He has been 100% compliant with his medications and I encouraged him to continue doing well with his meds and he says he intends on doing so because he likes the way he fills.   He needs to work on being more organized with his meds as when they get delivered to the house he has a hard time remembering what he did with them.  He has a bag for all his pill bottles and I encouraged him to open packages when they arrive and place them in the bag so that we won't have to spend so much time looking for them during the visit.   ACTION: Home visit completed  [name] [id number] 08/05/22  Patient Care Team: [name], [name], MD as PCP - General (Family Medicine) Bensimhon, Shaune Pascal, MD as PCP - Cardiology (Cardiology) Alda Berthold, DO as Consulting Physician (Neurology)  Patient Active Problem List   Diagnosis Date Noted   Cardiac amyloidosis [facility]) 05/25/2022   Pruritic rash 05/25/2022   Polyneuropathy associated with underlying disease ([place]) 01/15/2022   Atherosclerosis of aorta ([place]) 01/15/2022   History of CVA (cerebrovascular accident) without residual deficits 12/24/2021   AKI (acute kidney injury) ([place]) 12/13/2021   Diarrhea 12/13/2021   Shortness of breath 12/13/2021   Chest pain 12/13/2021   Nausea and vomiting 12/13/2021   Heart failure ([place]) 12/08/2021    Hypertension associated with diabetes ([place]) 07/12/2021   Chronic kidney disease, stage 3a ([place]) 07/12/2021   Hyperkalemia 07/12/2021   CHF exacerbation ([place]) 04/20/2021   Acute on chronic combined systolic and diastolic CHF (congestive heart failure) ([place]) 03/09/2021   Acute on chronic HFrEF (heart failure with reduced ejection fraction) ([place]) 03/08/2021   HFrEF (heart failure with reduced ejection fraction) ([place]) 02/03/2021   Diabetes mellitus ([place]) 03/27/2019   Hyperlipidemia associated with type 2 diabetes mellitus ([place]) 03/27/2019   CAD (coronary artery disease) 03/05/2019   History of MI (myocardial infarction) 03/05/2019   Type 2 diabetes mellitus with diabetic neuropathy, unspecified ([place]) 03/05/2019   HLD (hyperlipidemia) 03/05/2019   GERD (gastroesophageal reflux disease) 03/05/2019   Hypertension, essential, benign 03/05/2019    Current Outpatient Medications:    aspirin EC 81 MG tablet, Take 1 tablet (81 mg total) by mouth daily. Swallow whole., Disp: 30 tablet, Rfl: 12   clopidogrel (PLAVIX) 75 MG tablet, Take 1 tablet (75 mg total) by mouth daily., Disp: 30 tablet, Rfl: 3   Continuous Blood Gluc Receiver ([place]) DEVI, Use to check blood sugars, Disp: 3 each, Rfl: 3   cyanocobalamin (VITAMIN B12) 1000 MCG tablet, Take 1,000 mcg by mouth daily., Disp: , Rfl:    cyanocobalamin 1000 MCG tablet, TAKE ONE TABLET BY MOUTH DAILY, Disp: 30 tablet, Rfl: 2   DULoxetine (CYMBALTA) 30 MG  capsule, Take 1 capsule (30 mg total) by mouth daily., Disp: 30 capsule, Rfl: 3   empagliflozin (JARDIANCE) 25 MG TABS tablet, Take 1 tablet by mouth once daily before breakfast, Disp: 90 tablet, Rfl: 0   hydrALAZINE (APRESOLINE) 50 MG tablet, Take 1 tablet (50 mg total) by mouth 3 (three) times daily., Disp: 90 tablet, Rfl: 6   hydrocortisone cream 1 %, Apply 1 Application topically 2 (two) times daily as needed for itching., Disp: 45 g, Rfl: 1   isosorbide mononitrate (IMDUR) 30 MG 24 hr  tablet, Take 1 tablet (30 mg total) by mouth daily., Disp: 30 tablet, Rfl: 6   sacubitril-valsartan (ENTRESTO) 24-26 MG, Take 1 tablet by mouth 2 (two) times daily., Disp: 60 tablet, Rfl: 3   spironolactone (ALDACTONE) 25 MG tablet, Take 1 tablet (25 mg total) by mouth daily., Disp: 30 tablet, Rfl: 11   atorvastatin (LIPITOR) 80 MG tablet, Take 1 tablet (80 mg total) by mouth daily., Disp: 30 tablet, Rfl: 6   carvedilol (COREG) 6.25 MG tablet, Take 1 tablet (6.25 mg total) by mouth EVERY 12 HOURS (10AM &10PM), Disp: 60 tablet, Rfl: 6   Continuous Blood Gluc Sensor (DEXCOM G7 SENSOR) MISC, USE TO CHECK BLOOD SUGARS, Disp: 3 each, Rfl: 2   empagliflozin (JARDIANCE) 10 MG TABS tablet, Take 1 tablet (10 mg total) by mouth daily. (Patient not taking: Reported on 07/20/2022), Disp: 30 tablet, Rfl: 3   empagliflozin (JARDIANCE) 25 MG TABS tablet, Take 1 tablet (25 mg total) by mouth daily before breakfast., Disp: 90 tablet, Rfl: 1   ondansetron (ZOFRAN) 4 MG tablet, Take 1 tablet (4 mg total) by mouth every 8 (eight) hours as needed for nausea or vomiting. (Patient not taking: Reported on 07/20/2022), Disp: 12 tablet, Rfl: 0   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, Take 1 capsule by mouth daily., Disp: 30 capsule, Rfl: 11   torsemide (DEMADEX) 20 MG tablet, Take 1 tablet (20 mg total) by mouth every Monday, Wednesday, and Friday., Disp: 30 tablet, Rfl: 3 Allergies  Allergen Reactions   Bee Venom Anaphylaxis, Swelling and Other (See Comments)    Swells all over     Social History   Socioeconomic History   Marital status: Single    Spouse name: Not on file   Number of children: Not on file   Years of education: Not on file   Highest education level: Not on file  Occupational History   Not on file  Tobacco Use   Smoking status: Former    Types: Cigarettes    Quit date: 10/06/2017    Years since quitting: 4.8   Smokeless tobacco: Never  Substance and Sexual Activity   Alcohol use: Yes    Comment: Occasional  Beer   Drug use: Yes    Types: Marijuana   Sexual activity: Not on file  Other Topics Concern   Not on file  Social History Narrative   Right Handed.    Lives in a two story home    Lives with 64 year old son.    Social Determinants of Health   Financial Resource Strain: Low Risk  (01/18/2022)   Overall Financial Resource Strain (CARDIA)    Difficulty of Paying Living Expenses: Not hard at all  Food Insecurity: No Food Insecurity (06/04/2022)   Hunger Vital Sign    Worried About Running Out of Food in the Last Year: Never true    Ran Out of Food in the Last Year: Never true  Transportation Needs: No  Transportation Needs (06/04/2022)   PRAPARE - Hydrologist (Medical): No    Lack of Transportation (Non-Medical): No  Physical Activity: Inactive (01/18/2022)   Exercise Vital Sign    Days of Exercise per Week: 0 days    Minutes of Exercise per Session: 0 min  Stress: Stress Concern Present (01/18/2022)   [place]    Feeling of Stress : To some extent  Social Connections: Socially Isolated (01/18/2022)   Social Connection and Isolation Panel [NHANES]    Frequency of Communication with Friends and Family: More than three times a week    Frequency of Social Gatherings with Friends and Family: More than three times a week    Attends Religious Services: Never    Marine scientist or Organizations: No    Attends Archivist Meetings: Never    Marital Status: Never married  Intimate Partner Violence: Not At Risk (01/18/2022)   Humiliation, Afraid, Rape, and Kick questionnaire    Fear of Current or Ex-Partner: No    Emotionally Abused: No    Physically Abused: No    Sexually Abused: No    Physical Exam      Future Appointments  Date Time Provider [place]  08/10/2022 10:00 AM Maren Reamer, [place] None  09/24/2022 11:00 AM [name], [name], MD [facility]   09/27/2022  9:30 AM El-Khouri, Gerarda Gunther, RD [place] NDM  10/25/2022  1:30 PM Narda Amber K, DO [facility] None

## 2022-08-05 NOTE — Progress Notes (Unsigned)
Care Management & Coordination Services Pharmacy Note  08/05/2022 Name:  Thomas West MRN:  [id number] DOB:  11-Sep-1960  Summary: ***  Recommendations/Changes made from today's visit: ***  Follow up plan: ***   Subjective: Thomas West is an 62 y.o. year old male who is a primary patient of [name], Malka So, MD.  The care coordination team was consulted for assistance with disease management and care coordination needs.    {CCMTELEPHONEFACETOFACE:21091510} for initial visit.  Recent office visits: 05/25/22 [name], [name], MD - Patient presented for Type 2 diabetes mellitus with diabetic neuropathy without long term current use of insulin and other concerns. Prescribed Hydrocortisone. Increased Jardiance.    Recent consult visits:  08/05/22 Renee Ramus, Paramedic - Patient had home para medicine visit for CHF. No medication changes.    06/29/22 El-Khouri, Gerarda Gunther, RD (Dietician) - Patient presented for Type 2 diabetes mellitus with diabetic neuropathy without long term current use of insulin. No medication changes.    06/22/22 MilfordMaricela Bo, FNP (Cardiology)- Patient presented for HFrEF and other concerns. No medication changes.    06/14/22 Alda Berthold, DO (Neurology) - Patient presented for Neuropathy amyloid. Prescribed Duloxetine.    04/08/22 Bensimhon, Shaune Pascal, MD (Cardiology) - Patient presented for HFrEF and other concerns. No medication changes  Hospital visits: 05/31/22 [facility] for bronchitis and other concerns. LOS 16 hours. START doxycycline, zofran 4mg  and percocet 5-325mg .  04/28/22 [facility] ED for atypical chest pain. LOS 5 hours. No med changes   Objective:  Lab Results  Component Value Date   CREATININE 1.48 (H) 06/22/2022   BUN 16 06/22/2022   GFR 52.93 (L) 02/03/2021   EGFR 60 01/02/2021   GFRNONAA 54 (L) 06/22/2022   GFRAA 59 (L) 02/06/2018   NA 137 06/22/2022   K 4.1 06/22/2022   CALCIUM 9.0 06/22/2022   CO2 26 06/22/2022   GLUCOSE  213 (H) 06/22/2022    Lab Results  Component Value Date/Time   HGBA1C 7.5 (A) 05/25/2022 11:27 AM   HGBA1C 7.3 (H) 12/13/2021 06:40 AM   HGBA1C 11.7 (H) 04/20/2021 11:00 AM   FRUCTOSAMINE 436 (H) 02/03/2021 09:11 AM   GFR 52.93 (L) 02/03/2021 09:11 AM   MICROALBUR 165.1 (H) 02/03/2021 09:11 AM    Last diabetic Eye exam: No results found for: "HMDIABEYEEXA"  Last diabetic Foot exam: No results found for: "HMDIABFOOTEX"   Lab Results  Component Value Date   CHOL 138 03/13/2021   HDL 40 (L) 03/13/2021   LDLCALC 86 03/13/2021   TRIG 60 03/13/2021   CHOLHDL 3.5 03/13/2021       Latest Ref Rng & Units 06/01/2022    9:30 AM 12/12/2021    7:46 PM 12/07/2021    8:32 PM  Hepatic Function  Total Protein 6.5 - 8.1 g/dL 7.1  8.3  6.9   Albumin 3.5 - 5.0 g/dL 3.5  4.1  3.4   AST 15 - 41 U/L 17  25  33   ALT 0 - 44 U/L 15  20  34   Alk Phosphatase 38 - 126 U/L 71  117  143   Total Bilirubin 0.3 - 1.2 mg/dL 0.6  0.4  0.9   Bilirubin, Direct 0.0 - 0.2 mg/dL <0.1  0.1      Lab Results  Component Value Date/Time   TSH 0.782 04/21/2021 03:35 AM   TSH 1.37 02/03/2021 09:11 AM       Latest Ref Rng & Units 05/31/2022  9:43 PM 04/28/2022   12:10 PM 04/08/2022   10:41 AM  CBC  WBC 4.0 - 10.5 K/uL 8.8  8.1  7.3   Hemoglobin 13.0 - 17.0 g/dL 13.7  13.8  13.2   Hematocrit 39.0 - 52.0 % 40.8  40.7  39.6   Platelets 150 - 400 K/uL 323  351  342     Lab Results  Component Value Date/Time   VITAMINB12 408 08/18/2021 01:05 PM   [id number] 03/12/2021 03:32 PM    Clinical ASCVD: Yes  The ASCVD Risk score (Arnett DK, et al., 2019) failed to calculate for the following reasons:   The patient has a prior MI or stroke diagnosis        06/29/2022    9:07 AM 05/25/2022   11:26 AM 01/18/2022    3:52 PM  Depression screen PHQ 2/9  Decreased Interest 0 0 0  Down, Depressed, Hopeless 0 0 0  PHQ - 2 Score 0 0 0  Altered sleeping  1 0  Tired, decreased energy  0 0  Change in appetite  0 0   Feeling bad or failure about yourself   0 0  Trouble concentrating  0 0  Moving slowly or fidgety/restless  0 0  Suicidal thoughts  0 0  PHQ-9 Score  1 0  Difficult doing work/chores  Not difficult at all Not difficult at all     Social History   Tobacco Use  Smoking Status Former   Types: Cigarettes   Quit date: 10/06/2017   Years since quitting: 4.8  Smokeless Tobacco Never   BP Readings from Last 3 Encounters:  08/05/22 110/70  07/28/22 (!) 160/100  07/20/22 122/80   Pulse Readings from Last 3 Encounters:  08/05/22 92  07/28/22 77  07/20/22 76   Wt Readings from Last 3 Encounters:  08/05/22 156 lb (70.8 kg)  07/28/22 158 lb (71.7 kg)  07/20/22 159 lb (72.1 kg)   BMI Readings from Last 3 Encounters:  08/05/22 23.04 kg/m  07/28/22 23.33 kg/m  07/20/22 23.48 kg/m    Allergies  Allergen Reactions   Bee Venom Anaphylaxis, Swelling and Other (See Comments)    Swells all over    Medications Reviewed Today     Reviewed by Renee Ramus, Paramedic (Certified Medical Assistant) on 07/20/22 at 1430  Med List Status: <None>   Medication Order Taking? Sig Documenting Provider Last Dose Status Informant  aspirin EC 81 MG tablet [id number] Yes Take 1 tablet (81 mg total) by mouth daily. Swallow whole. Earnie Larsson, NP Taking Active Self           Med Note Tamala Julian, [name]   Wed Mar 17, 2022  2:54 PM)    atorvastatin (LIPITOR) 80 MG tablet [id number] Yes Take 1 tablet (80 mg total) by mouth daily. Earnie Larsson, NP Taking Active Self  carvedilol (COREG) 6.25 MG tablet [id number] Yes Take 1 tablet (6.25 mg total) by mouth EVERY 12 HOURS (10AM &10PM) Earnie Larsson, NP Taking Active Self  clopidogrel (PLAVIX) 75 MG tablet [id number] Yes Take 1 tablet (75 mg total) by mouth daily. Rafael Bihari, FNP Taking Active   Continuous Blood Gluc Receiver ([place]) [place] [id number] Yes Use to check blood sugars [name], [name], MD Taking Active   Continuous Blood Gluc Sensor  ([place]) [place] [id number] Yes USE TO CHECK BLOOD SUGARS [name], [name], MD Taking Active   cyanocobalamin (VITAMIN B12) 1000 MCG tablet [id number] Yes Take  1,000 mcg by mouth daily. [provider] Taking Consider Medication Status and Discontinue (Completed Course)   cyanocobalamin 1000 MCG tablet [id number] Yes TAKE ONE TABLET BY MOUTH DAILY [name], [name], MD Taking Active   DULoxetine (CYMBALTA) 30 MG capsule [id number] Yes Take 1 capsule (30 mg total) by mouth daily. Narda Amber K, DO Taking Active   empagliflozin (JARDIANCE) 10 MG TABS tablet [id number] No Take 1 tablet (10 mg total) by mouth daily.  Patient not taking: Reported on 07/20/2022   Rafael Bihari, FNP Not Taking Consider Medication Status and Discontinue (Change in therapy)   empagliflozin (JARDIANCE) 25 MG TABS tablet [id number] No Take 1 tablet (25 mg total) by mouth daily before breakfast.  Patient not taking: Reported on 07/20/2022   [name], [name], MD Not Taking Consider Medication Status and Discontinue (Change in therapy)   empagliflozin (JARDIANCE) 25 MG TABS tablet [id number] Yes Take 1 tablet by mouth once daily before breakfast [name], [name], MD Taking Active   hydrALAZINE (APRESOLINE) 50 MG tablet [id number] Yes Take 1 tablet (50 mg total) by mouth 3 (three) times daily. Earnie Larsson, NP Taking Active Self  hydrocortisone cream 1 % [id number] Yes Apply 1 Application topically 2 (two) times daily as needed for itching. [name], [name], MD Taking Active   isosorbide mononitrate (IMDUR) 30 MG 24 hr tablet [id number] Yes Take 1 tablet (30 mg total) by mouth daily. Earnie Larsson, NP Taking Active Self           Med Note Tamala Julian, [name]   Wed Feb 17, 2022 12:11 PM)    ondansetron (ZOFRAN) 4 MG tablet [id number] No Take 1 tablet (4 mg total) by mouth every 8 (eight) hours as needed for nausea or vomiting.  Patient not taking: Reported on 07/20/2022   Tegeler, Gwenyth Allegra, MD Not Taking Consider Medication Status  and Discontinue (Completed Course)   sacubitril-valsartan (ENTRESTO) 24-26 MG [id number] Yes Take 1 tablet by mouth 2 (two) times daily. Bensimhon, Shaune Pascal, MD Taking Active   spironolactone (ALDACTONE) 25 MG tablet [id number] Yes Take 1 tablet (25 mg total) by mouth daily. Joette Catching, PA-C Taking Active   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 [id number] Yes Take 1 capsule by mouth daily. Bensimhon, Shaune Pascal, MD Taking Active Self  torsemide (DEMADEX) 20 MG tablet [id number] Yes Take 1 tablet (20 mg total) by mouth every Monday, Wednesday, and Friday. [place], Maricela Bo, FNP Taking Active             SDOH:  (Social Determinants of Health) assessments and interventions performed: Yes SDOH Interventions    Flowsheet Row Telephone from 06/04/2022 in [place] Telephone from 05/03/2022 in [place] Coordination Clinical Support from 01/18/2022 in [place] [facility] [place] from 01/05/2022 in [place] Patient Outreach Telephone from 12/31/2021 in [place] CHF from 12/07/2021 in [place] [facility] [place]  SDOH Interventions        Food Insecurity Interventions Intervention Not Indicated -- Intervention Not Indicated Intervention Not Indicated -- Other (Comment)  [food pantry list given]  Housing Interventions -- -- Intervention Not Indicated Intervention Not Indicated -- Intervention Not Indicated  Transportation Interventions Intervention Not Indicated Intervention Not Indicated Intervention Not Indicated Intervention Not Indicated Other (Comment)  [referred to transportation car not working] Payor Benefit, Other (Comment)  [has his own car]  Financial Strain Interventions -- --  Intervention Not Indicated -- -- --  Physical Activity Interventions -- -- Intervention Not Indicated  -- -- --  Stress Interventions -- -- Intervention Not Indicated -- -- --  Social Connections Interventions -- -- Intervention Not Indicated -- -- --       Medication Assistance: {MEDASSISTANCEINFO:25044}  Medication Access: Within the past 30 days, how often has patient missed a dose of medication? *** Is a pillbox or other method used to improve adherence? {YES/NO:21197} Factors that may affect medication adherence? {CHL DESC; BARRIERS:21522} Are meds synced by current pharmacy? {YES/NO:21197} Are meds delivered by current pharmacy? {YES/NO:21197} Does patient experience delays in picking up medications due to transportation concerns? {YES/NO:21197}  [facility] Services Reviewed: Is patient disadvantaged to use [facility]?: No  Current Rx insurance plan: [place] Name and location of Current pharmacy:  [address] [place] [address] Phone: [phone] Fax: [phone]  [place], [name] [address] Phone: [phone] Fax: [phone]  [facility] services reviewed with patient today?: {YES/NO:21197} Patient requests to transfer care to [facility]?: {YES/NO:21197} Reason patient declined to change pharmacies: {US patient preference:27474}  Compliance/Adherence/Medication fill history: Care Gaps: COVID Vaccine - Overdue Eye Exam - Overdue Colonoscopy - Overdue Foot Exam - Overdue Flu Vaccine - Postponed Zoster Vaccine - Postponed  Star-Rating Drugs: Atorvastatin 80 mg - Last filled 07/01/22 30 DS at [facility] 10 mg- Last filled 07/01/22 30 DS at [facility] 25 mg - Last filled 08/02/22 90 DS at [place]     Assessment/Plan   Hypertension (BP goal <130/80) -Controlled -Current treatment: Hydralazine 50mg  TID Appropriate, Effective, Safe, Accessible See HF section -Medications previously  tried: see heart failure section  -Current home readings: *** -Current dietary habits: *** -Current exercise habits: *** -{ACTIONS;DENIES/REPORTS:21021675::"Denies"} hypotensive/hypertensive symptoms -Educated on {CCM BP Counseling:25124} -Counseled to monitor BP at home ***, document, and provide log at future appointments -{CCMPHARMDINTERVENTION:25122}  Hyperlipidemia: (LDL goal < 70) -Controlled -Current treatment: Atorvastatin 80mg  1 qd -Medications previously tried: None  -Current dietary patterns: *** -Current exercise habits: *** -Educated on {CCM HLD Counseling:25126} -{CCMPHARMDINTERVENTION:25122}  Diabetes with Neuropathy (A1c goal <7%) -Uncontrolled -Current medications: Jardiance 25mg  1 qd -Medications previously tried: Glipizide, Metformin, Ozempic, Januvia  -Current home glucose readings fasting glucose: *** post prandial glucose: *** -{ACTIONS;DENIES/REPORTS:21021675::"Denies"} hypoglycemic/hyperglycemic symptoms -Current meal patterns:  breakfast: ***  lunch: ***  dinner: *** snacks: *** drinks: *** -Current exercise: *** -Educated on {CCM DM COUNSELING:25123} -Counseled to check feet daily and get yearly eye exams -{CCMPHARMDINTERVENTION:25122}  Heart Failure (Goal: manage symptoms and prevent exacerbations) -Controlled -Last ejection fraction: 30-35% (Date: 11/02/21) -HF type: HFrEF (EF < 40%) -NYHA Class: II (slight limitation of activity) -AHA HF Stage: C (Heart disease and symptoms present) -Current treatment: Carvedilol 6.25mg  BID Jardiance 25mg  1 qd Entresto 24-26mg  BID Spironolactone 25mg  1 qd Torsemide 20mg  1 MWF -Medications previously tried: Lasix, Metoprolol -Current home BP/HR readings: *** -Current home daily weights: *** -Current dietary habits: *** -Current exercise habits: *** -Educated on {CCM HF Counseling:25125} -{CCMPHARMDINTERVENTION:25122}   CAD (Goal: Slow progression of atherosclerosis (plaques / blockages) throughout  your body to reduce risk of heart attack and strokes) [place] controlled/uncontrolled: Controlled Current Medication Therapy:  Aspirin 81mg  1qd  Plavix 75mg  1 qd  Isosorbide Mono 30mg  1 qd Medications Previously Tried: Chest Pain in last 3 months: {YES/NO} Any signs of bleed? Counseled on:  Chronic  Kidney Disease Stage 3a  -All medications assessed for renal dosing and appropriateness in chronic kidney disease.   Amyloidosis  -Not assessed today -Current treatment  Tafamidis 61mg  1 qd Appropriate, Effective, Safe, Accessible    Maren Reamer Clinical Pharmacist [phone]

## 2022-08-06 ENCOUNTER — Telehealth: Payer: Self-pay

## 2022-08-06 NOTE — Progress Notes (Unsigned)
Patient ID: Thomas West, male   DOB: Sep 25, 1960, 62 y.o.   MRN: [id number]  Care Management & Coordination Services Pharmacy Team  Reason for Encounter: Appointment Reminder  Contacted patient to confirm in office appointment with Theo Dills, PharmD on 08/10/22 at 56. {US [facility] Outreach:28874}  Have you seen any other providers since your last visit? **{YES NO:22349} Any changes in your medications or health? {YES NO:22349} Any side effects from any medications? {YES NO:22349} Do you have an symptoms or problems not managed by your medications? {YES NO:22349} Any concerns about your health right now? {YES NO:22349} Has your provider asked that you check blood pressure, blood sugar, or follow special diet at home? {YES NO:22349} Do you get any type of exercise on a regular basis? {YES NO:22349} Can you think of a goal you would like to reach for your health? *** Do you have any problems getting your medications? {YES NO:22349} Is there anything that you would like to discuss during the appointment? ***  Patient assistance for the following mediations  Patient aware to bring blood pressure cuff, medications that do not need refrigeration and supplements to appointment   Chart review:  Recent office visits:  05/25/22 [name], [name], MD - Patient presented for Type 2 diabetes mellitus with diabetic neuropathy without long term current use of insulin and other concerns. Prescribed Hydrocortisone. Increased Jardiance.   Recent consult visits:  08/05/22 Renee Ramus, Paramedic - Patient had home para medicine visit for CHF. No medication changes.   06/29/22 El-Khouri, Gerarda Gunther, RD (Dietician) - Patient presented for Type 2 diabetes mellitus with diabetic neuropathy without long term current use of insulin. No medication changes.   06/22/22 MilfordMaricela Bo, FNP (Cardiology)- Patient presented for HFrEF and other concerns. No medication changes.   06/14/22 Alda Berthold, DO (Neurology) -  Patient presented for Neuropathy amyloid. Prescribed Duloxetine.   04/08/22 Bensimhon, Shaune Pascal, MD (Cardiology) - Patient presented for HFrEF and other concerns. No medication changes.   Hospital visits:  Medication Reconciliation was completed by comparing discharge summary, patient's EMR and Pharmacy list, and upon discussion with patient.  Patient [facility] ED at [facility] on 05/31/22 due to Bronchitis and other concerns. Patient was present for 16 hours.  New?Medications Started at [facility] Discharge:?? -started  doxycycline 100 MG capsule ondansetron 4 MG tablet oxyCODONE-acetaminophen 5-325 MG tablet  Medication Changes at Hospital Discharge: -Changed  none  Medications Discontinued at Hospital Discharge: -Stopped  none  Medications that remain the same after Hospital Discharge:??  -All other medications will remain the same.     Patient presented to [facility] ED at [facility] on 04/28/22 due to atypical chest pain.  Patient was present for 5 hours.  New?Medications Started at [facility] Discharge:?? -started  none  Medication Changes at Hospital Discharge: -Changed  none  Medications Discontinued at Hospital Discharge: -Stopped  none  Medications that remain the same after Hospital Discharge:??  -All other medications will remain the same.     Fill History: atorvastatin (LIPITOR) 80 MG tablet 07/01/2022 30   carvedilol (COREG) 6.25 MG tablet 07/01/2022 30   CLOPIDOGREL  75 MG TABS 07/07/2022 30   empagliflozin (JARDIANCE) 25 MG TABS tablet 08/02/2022 90   empagliflozin (JARDIANCE) 10 MG TABS tablet 07/01/2022 30   hydrALAZINE (APRESOLINE) 50 MG tablet 07/14/2022 30   HYDROCORTISONE  1 % CREA 06/23/2022 30   isosorbide mononitrate (IMDUR) 30 MG 24 hr tablet 07/01/2022 30   sacubitril-valsartan (ENTRESTO) 24-26 MG  07/01/2022 30   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 06/21/2022 30   spironolactone (ALDACTONE) 25 MG tablet 07/28/2022 30    torsemide (DEMADEX) 20 MG tablet 08/05/2022 70   ONDANSETRON HYDROCHLORIDE  4 MG TABS 06/01/2022 4   DULoxetine (CYMBALTA) 30 MG capsule 07/20/2022 30   Star Rating Drugs:  Atorvastatin 80 mg - Last filled 07/01/22 30 DS at [facility] 10 mg- Last filled 07/01/22 30 DS at [facility] 25 mg - Last filled 08/02/22 90 DS at [place]: [facility] [place] Flu Vaccine - Postponed Zoster Vaccine - Frank [place]ton Clinical Pharmacist Assistant [phone]

## 2022-08-09 ENCOUNTER — Other Ambulatory Visit: Payer: Self-pay | Admitting: Family Medicine

## 2022-08-11 ENCOUNTER — Other Ambulatory Visit (HOSPITAL_COMMUNITY): Payer: Self-pay

## 2022-08-11 ENCOUNTER — Other Ambulatory Visit (HOSPITAL_COMMUNITY): Payer: Self-pay | Admitting: Emergency Medicine

## 2022-08-11 ENCOUNTER — Telehealth (HOSPITAL_COMMUNITY): Payer: Self-pay | Admitting: Cardiology

## 2022-08-11 ENCOUNTER — Other Ambulatory Visit: Payer: Self-pay

## 2022-08-11 DIAGNOSIS — I10 Essential (primary) hypertension: Secondary | ICD-10-CM

## 2022-08-11 MED ORDER — HYDRALAZINE HCL 50 MG PO TABS
75.0000 mg | ORAL_TABLET | Freq: Three times a day (TID) | ORAL | 11 refills | Status: DC
  Filled 2022-08-11: qty 135, 30d supply, fill #0
  Filled 2022-09-15: qty 135, 30d supply, fill #1

## 2022-08-11 MED ORDER — TORSEMIDE 20 MG PO TABS
20.0000 mg | ORAL_TABLET | Freq: Every day | ORAL | 3 refills | Status: DC
  Filled 2022-08-11 – 2022-09-15 (×3): qty 30, 30d supply, fill #0

## 2022-08-11 NOTE — Telephone Encounter (Signed)
Please see the attached message from [facility]

## 2022-08-11 NOTE — Telephone Encounter (Signed)
-----   Message from Renee Ramus, Paramedic sent at 08/11/2022  9:11 AM EDT ----- Regarding: weight &  BP Good morning! Today Thomas West is up 5 lbs from last visit.  Today's BP is 178/100.  He denies chest pain or SOB.  No edema and lung sounds clear.  He denies headache or visual disturbances.  He has only missed 1 noon Hydralazine dose out of a weeks regimen.  He states he knows his BP is high because he's under a lot of stress w/ a death in the family (28y.o. nephew)  Please advise if any med changes are desired. Thank you,    Renee Ramus, [place] 08/11/2022

## 2022-08-11 NOTE — Telephone Encounter (Signed)
Message to [facility] with paramedicine to assist with med changes New scripts sent to pharmacy Lab appt 3/28 @ 12 MD fu 5/29 @ 1020

## 2022-08-11 NOTE — Progress Notes (Signed)
Paramedicine Encounter    Patient ID: Thomas West, male    DOB: 13-Oct-1960, 62 y.o.   MRN: [id number]   Complaints NONE  Assessment A&O x 4, skin W&D w/ good color.  Lung sounds clear and equal bilat.  No peripheral edema noted,  Compliance with meds Missed one noon dose of Hydralazine  Pill box filled x 1 week  Refills needed Atorvastatin, Entresto  Meds changes since last visit NONE    Social changes NONE   BP (!) 178/100 (BP Location: Left Arm, Patient Position: Sitting, Cuff Size: Normal)   Pulse 84   Resp 16   Wt 161 lb 6.4 oz (73.2 kg)   SpO2 98%   BMI 23.83 kg/m  Weight yesterday-not taken Last visit weight-156lb CBG 138  Reconciled med box x 1 week.  Pt was A&O x 4, skin W&D w/ good color.  He has no complaints of chest pain or SOB.  His BP is high but he denies headache, visual disturbance and has no obvious neurological deficits.  Message sent to HF Triage for possible med changes.  ACTION: Home visit completed  [name] [phone] 08/11/22  Patient Care Team: [name], [name], MD as PCP - General (Family Medicine) Bensimhon, Shaune Pascal, MD as PCP - Cardiology (Cardiology) Alda Berthold, DO as Consulting Physician (Neurology)  Patient Active Problem List   Diagnosis Date Noted   Cardiac amyloidosis [facility]) 05/25/2022   Pruritic rash 05/25/2022   Polyneuropathy associated with underlying disease ([place]) 01/15/2022   Atherosclerosis of aorta ([place]) 01/15/2022   History of CVA (cerebrovascular accident) without residual deficits 12/24/2021   AKI (acute kidney injury) ([place]) 12/13/2021   Diarrhea 12/13/2021   Shortness of breath 12/13/2021   Chest pain 12/13/2021   Nausea and vomiting 12/13/2021   Heart failure ([place]) 12/08/2021   Hypertension associated with diabetes ([place]) 07/12/2021   Chronic kidney disease, stage 3a ([place]) 07/12/2021   Hyperkalemia 07/12/2021   CHF exacerbation ([place]) 04/20/2021   Acute on chronic combined systolic and  diastolic CHF (congestive heart failure) ([place]) 03/09/2021   Acute on chronic HFrEF (heart failure with reduced ejection fraction) ([place]) 03/08/2021   HFrEF (heart failure with reduced ejection fraction) ([place]) 02/03/2021   Diabetes mellitus ([place]) 03/27/2019   Hyperlipidemia associated with type 2 diabetes mellitus ([place]) 03/27/2019   CAD (coronary artery disease) 03/05/2019   History of MI (myocardial infarction) 03/05/2019   Type 2 diabetes mellitus with diabetic neuropathy, unspecified ([place]) 03/05/2019   HLD (hyperlipidemia) 03/05/2019   GERD (gastroesophageal reflux disease) 03/05/2019   Hypertension, essential, benign 03/05/2019    Current Outpatient Medications:    aspirin EC 81 MG tablet, Take 1 tablet (81 mg total) by mouth daily. Swallow whole., Disp: 30 tablet, Rfl: 12   clopidogrel (PLAVIX) 75 MG tablet, Take 1 tablet (75 mg total) by mouth daily., Disp: 30 tablet, Rfl: 3   Continuous Blood Gluc Receiver ([place]) DEVI, Use to check blood sugars, Disp: 3 each, Rfl: 3   Continuous Blood Gluc Sensor (DEXCOM G7 SENSOR) MISC, USE TO CHECK BLOOD SUGARS, Disp: 3 each, Rfl: 2   cyanocobalamin 1000 MCG tablet, TAKE ONE TABLET BY MOUTH DAILY, Disp: 30 tablet, Rfl: 1   DULoxetine (CYMBALTA) 30 MG capsule, Take 1 capsule (30 mg total) by mouth daily., Disp: 30 capsule, Rfl: 3   empagliflozin (JARDIANCE) 25 MG TABS tablet, Take 1 tablet by mouth once daily before breakfast, Disp: 90 tablet, Rfl: 0   hydrALAZINE (APRESOLINE) 50 MG tablet,  Take 1 tablet (50 mg total) by mouth 3 (three) times daily., Disp: 90 tablet, Rfl: 6   hydrocortisone cream 1 %, Apply 1 Application topically 2 (two) times daily as needed for itching., Disp: 45 g, Rfl: 1   isosorbide mononitrate (IMDUR) 30 MG 24 hr tablet, Take 1 tablet (30 mg total) by mouth daily., Disp: 30 tablet, Rfl: 6   sacubitril-valsartan (ENTRESTO) 24-26 MG, Take 1 tablet by mouth 2 (two) times daily., Disp: 60 tablet, Rfl: 3    spironolactone (ALDACTONE) 25 MG tablet, Take 1 tablet (25 mg total) by mouth daily., Disp: 30 tablet, Rfl: 11   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 (Patient not taking: Reported on 08/05/2022), Disp: 30 tablet, Rfl: 3   empagliflozin (JARDIANCE) 25 MG TABS tablet, Take 1 tablet (25 mg total) by mouth daily before breakfast. (Patient not taking: Reported on 08/11/2022), Disp: 90 tablet, Rfl: 1   ondansetron (ZOFRAN) 4 MG tablet, Take 1 tablet (4 mg total) by mouth every 8 (eight) hours as needed for nausea or vomiting., Disp: 12 tablet, Rfl: 0 Allergies  Allergen Reactions   Bee Venom Anaphylaxis, Swelling and Other (See Comments)    Swells all over     Social History   Socioeconomic History   Marital status: Single    Spouse name: Not on file   Number of children: Not on file   Years of education: Not on file   Highest education level: Not on file  Occupational History   Not on file  Tobacco Use   Smoking status: Former    Types: Cigarettes    Quit date: 10/06/2017    Years since quitting: 4.8   Smokeless tobacco: Never  Substance and Sexual Activity   Alcohol use: Yes    Comment: Occasional Beer   Drug use: Yes    Types: Marijuana   Sexual activity: Not on file  Other Topics Concern   Not on file  Social History Narrative   Right Handed.    Lives in a two story home    Lives with 34 year old son.    Social Determinants of Health   Financial Resource Strain: Low Risk   (01/18/2022)   Overall Financial Resource Strain (CARDIA)    Difficulty of Paying Living Expenses: Not hard at all  Food Insecurity: No Food Insecurity (06/04/2022)   Hunger Vital Sign    Worried About Running Out of Food in the Last Year: Never true    Ran Out of Food in the Last Year: Never true  Transportation Needs: No Transportation Needs (06/04/2022)   PRAPARE - Hydrologist (Medical): No    Lack of Transportation (Non-Medical): No  Physical Activity: Inactive (01/18/2022)   Exercise Vital Sign    Days of Exercise per Week: 0 days    Minutes of Exercise per Session: 0 min  Stress: Stress Concern Present (01/18/2022)   [employer]  of Occupational Health - Occupational Stress Questionnaire    Feeling of Stress : To some extent  Social Connections: Socially Isolated (01/18/2022)   Social Connection and Isolation Panel [NHANES]    Frequency of Communication with Friends and Family: More than three times a week    Frequency of Social Gatherings with Friends and Family: More than three times a week    Attends Religious Services: Never    Marine scientist or Organizations: No    Attends Archivist Meetings: Never    Marital Status: Never married  Intimate Partner Violence: Not At Risk (01/18/2022)   Humiliation, Afraid, Rape, and Kick questionnaire    Fear of Current or Ex-Partner: No    Emotionally Abused: No    Physically Abused: No    Sexually Abused: No    Physical Exam      Future Appointments  Date Time Provider [place]  09/24/2022 11:00 AM [name], [name], MD [facility]  09/27/2022  9:30 AM El-Khouri, Gerarda Gunther, RD [place] [facility]  10/25/2022  1:30 PM Narda Amber K, DO [facility] None

## 2022-08-12 ENCOUNTER — Other Ambulatory Visit: Payer: Self-pay

## 2022-08-12 ENCOUNTER — Other Ambulatory Visit (HOSPITAL_COMMUNITY): Payer: Self-pay

## 2022-08-12 ENCOUNTER — Other Ambulatory Visit (HOSPITAL_COMMUNITY): Payer: Self-pay | Admitting: Emergency Medicine

## 2022-08-13 NOTE — Progress Notes (Signed)
Stopped by for and unscheduled visit to make med changes per [facility] @ 16:30  Torsemide increased to 20mg  daily.  He was doing Mon, Wed, Fri. Also increased Hydralazine to 75 mg TID.  These changes were made to his med box. Blood pressure was 190/120 today and pt states he has not taken any of his meds  today.  He states he has been pre-occupied with a recent DIF.  I advised him to continue to make a concerted effort to be compliant with his meds and he agrees to same.    Renee Ramus, [place] 08/13/2022

## 2022-08-18 ENCOUNTER — Other Ambulatory Visit (HOSPITAL_COMMUNITY): Payer: Self-pay

## 2022-08-18 ENCOUNTER — Telehealth (HOSPITAL_COMMUNITY): Payer: Self-pay

## 2022-08-18 NOTE — Telephone Encounter (Signed)
Reached out to pt to let him know dede is out today and to sch visit with me today.  He did not answer phone. I LVM for him to return my call.   Marylouise Stacks, [place] 08/18/2022

## 2022-08-19 ENCOUNTER — Other Ambulatory Visit (HOSPITAL_COMMUNITY): Payer: Medicare Other

## 2022-08-24 ENCOUNTER — Other Ambulatory Visit (HOSPITAL_COMMUNITY): Payer: Self-pay | Admitting: Emergency Medicine

## 2022-08-24 ENCOUNTER — Other Ambulatory Visit: Payer: Self-pay | Admitting: Family Medicine

## 2022-08-24 ENCOUNTER — Other Ambulatory Visit (HOSPITAL_COMMUNITY): Payer: Self-pay

## 2022-08-24 ENCOUNTER — Other Ambulatory Visit: Payer: Self-pay

## 2022-08-24 DIAGNOSIS — I2510 Atherosclerotic heart disease of native coronary artery without angina pectoris: Secondary | ICD-10-CM

## 2022-08-24 MED ORDER — ATORVASTATIN CALCIUM 80 MG PO TABS
80.0000 mg | ORAL_TABLET | Freq: Every day | ORAL | 1 refills | Status: DC
  Filled 2022-08-24: qty 90, 90d supply, fill #0

## 2022-08-24 NOTE — Telephone Encounter (Signed)
Does patient need to continue the Plavix or send to Cardiology?

## 2022-08-24 NOTE — Progress Notes (Signed)
Paramedicine Encounter    Patient ID: Thomas West, male    DOB: 06/26/1960, 62 y.o.   MRN: [id number]   Complaints NONE  Assessment   Compliance with meds  has not taken due to pre-occupied w/ death in family.    Pill box filled x 1 week  Refills needed Atorvastatin, Clopidogrel & Cymbalta  Meds changes since last visit none    Social changes none   BP (!) 180/100 (BP Location: Left Arm, Patient Position: Sitting, Cuff Size: Normal)   Pulse 80   Resp 16   Wt 164 lb 6.4 oz (74.6 kg)   BMI 24.28 kg/m  Weight yesterday-not taken Last visit weight-161.6lb    Thomas West admits today that he has not been taking his meds for several days due to being busy with a death in the family.  Pt. Had not taken morning meds at time of bp check and BP was up again this visit.  I continue to discuss med compliance with him and stress its importance.  It has been quite difficult to get him to engage in participating in his filling his pill box and/or follow up with refills.  His meds are delivered by mail and when they arrive he never opens them and sometimes misplaces them which adds to the overall visit because I have to search to find them.  When confronted about taking responsibility for his overall care Thomas West often places blame on his teenage son which I discourage.  I will continue to work on this aspect of his care.   ACTION: Home visit completed  Thomas West [id number] 08/24/22  Patient Care Team: [name], [name], MD as PCP - General (Family Medicine) Bensimhon, Shaune Pascal, MD as PCP - Cardiology (Cardiology) Alda Berthold, DO as Consulting Physician (Neurology)  Patient Active Problem List   Diagnosis Date Noted   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   Pruritic rash 05/25/2022   Polyneuropathy associated with underlying disease 01/15/2022   Atherosclerosis of aorta 01/15/2022   History of CVA (cerebrovascular accident) without residual deficits 12/24/2021   AKI  (acute kidney injury) 12/13/2021   Diarrhea 12/13/2021   Shortness of breath 12/13/2021   Chest pain 12/13/2021   Nausea and vomiting 12/13/2021   Heart failure 12/08/2021   Hypertension associated with diabetes 07/12/2021   Chronic kidney disease, stage 3a 07/12/2021   Hyperkalemia 07/12/2021   CHF exacerbation 04/20/2021   Acute on chronic combined systolic and diastolic CHF (congestive heart failure) 03/09/2021   Acute on chronic HFrEF (heart failure with reduced ejection fraction) 03/08/2021   HFrEF (heart failure with reduced ejection fraction) 02/03/2021   Diabetes mellitus 03/27/2019   Hyperlipidemia associated with type 2 diabetes mellitus 03/27/2019   CAD (coronary artery disease) 03/05/2019   History of MI (myocardial infarction) 03/05/2019   Type 2 diabetes mellitus with diabetic neuropathy, unspecified 03/05/2019   HLD (hyperlipidemia) 03/05/2019   GERD (gastroesophageal reflux disease) 03/05/2019   Hypertension, essential, benign 03/05/2019    Current Outpatient Medications:    aspirin EC 81 MG tablet, Take 1 tablet (81 mg total) by mouth daily. Swallow whole., Disp: 30 tablet, Rfl: 12   clopidogrel (PLAVIX) 75 MG tablet, Take 1 tablet (75 mg total) by mouth daily., Disp: 30 tablet, Rfl: 3   Continuous Blood Gluc Receiver ([place]) DEVI, Use to check blood sugars, Disp: 3 each, Rfl: 3   Continuous Blood Gluc Sensor (DEXCOM G7 SENSOR) MISC, USE TO CHECK BLOOD SUGARS, Disp: 3 each, Rfl: 2  cyanocobalamin 1000 MCG tablet, TAKE ONE TABLET BY MOUTH DAILY, Disp: 30 tablet, Rfl: 1   DULoxetine (CYMBALTA) 30 MG capsule, Take 1 capsule (30 mg total) by mouth daily., Disp: 30 capsule, Rfl: 3   empagliflozin (JARDIANCE) 25 MG TABS tablet, Take 1 tablet (25 mg total) by mouth daily before breakfast., Disp: 90 tablet, Rfl: 1   hydrALAZINE (APRESOLINE) 50 MG tablet, Take 1.5 tablets (75 mg total) by mouth 3 (three) times daily., Disp: 135 tablet, Rfl: 11   hydrocortisone  cream 1 %, Apply 1 Application topically 2 (two) times daily as needed for itching., Disp: 45 g, Rfl: 1   isosorbide mononitrate (IMDUR) 30 MG 24 hr tablet, Take 1 tablet (30 mg total) by mouth daily., Disp: 30 tablet, Rfl: 6   sacubitril-valsartan (ENTRESTO) 24-26 MG, Take 1 tablet by mouth 2 (two) times daily., Disp: 60 tablet, Rfl: 3   spironolactone (ALDACTONE) 25 MG tablet, Take 1 tablet (25 mg total) by mouth daily., Disp: 30 tablet, Rfl: 11   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, Take 1 capsule by mouth daily., Disp: 30 capsule, Rfl: 11   torsemide (DEMADEX) 20 MG tablet, Take 1 tablet (20 mg total) by mouth daily., Disp: 30 tablet, Rfl: 3   atorvastatin (LIPITOR) 80 MG tablet, Take 1 tablet (80 mg total) by mouth daily., Disp: 90 tablet, Rfl: 1   carvedilol (COREG) 6.25 MG tablet, Take 1 tablet (6.25 mg total) by mouth EVERY 12 HOURS (10AM &10PM), Disp: 60 tablet, Rfl: 6   empagliflozin (JARDIANCE) 10 MG TABS tablet, Take 1 tablet (10 mg total) by mouth daily., Disp: 30 tablet, Rfl: 3   empagliflozin (JARDIANCE) 25 MG TABS tablet, Take 1 tablet by mouth once daily before breakfast, Disp: 90 tablet, Rfl: 0   ondansetron (ZOFRAN) 4 MG tablet, Take 1 tablet (4 mg total) by mouth every 8 (eight) hours as needed for nausea or vomiting., Disp: 12 tablet, Rfl: 0 Allergies  Allergen Reactions   Bee Venom Anaphylaxis, Swelling and Other (See Comments)    Swells all over     Social History   Socioeconomic History   Marital status: Single    Spouse name: Not on file   Number of children: Not on file   Years of education: Not on file   Highest education level: Not on file  Occupational History   Not on file  Tobacco Use   Smoking status: Former    Types: Cigarettes    Quit date: 10/06/2017    Years since quitting: 4.8   Smokeless tobacco: Never  Substance and Sexual Activity   Alcohol use: Yes    Comment: Occasional Beer   Drug use: Yes    Types: Marijuana   Sexual activity: Not on file  Other  Topics Concern   Not on file  Social History Narrative   Right Handed.    Lives in a two story home    Lives with 38 year old son.    Social Determinants of Health   Financial Resource Strain: Low Risk  (01/18/2022)   Overall Financial Resource Strain (CARDIA)    Difficulty of Paying Living Expenses: Not hard at all  Food Insecurity: No Food Insecurity (06/04/2022)   Hunger Vital Sign    Worried About Running Out of Food in the Last Year: Never true    Ran Out of Food in the Last Year: Never true  Transportation Needs: No Transportation Needs (06/04/2022)   PRAPARE - Hydrologist (Medical):  No    Lack of Transportation (Non-Medical): No  Physical Activity: Inactive (01/18/2022)   Exercise Vital Sign    Days of Exercise per Week: 0 days    Minutes of Exercise per Session: 0 min  Stress: Stress Concern Present (01/18/2022)   [place]    Feeling of Stress : To some extent  Social Connections: Socially Isolated (01/18/2022)   Social Connection and Isolation Panel [NHANES]    Frequency of Communication with Friends and Family: More than three times a week    Frequency of Social Gatherings with Friends and Family: More than three times a week    Attends Religious Services: Never    Marine scientist or Organizations: No    Attends Archivist Meetings: Never    Marital Status: Never married  Intimate Partner Violence: Not At Risk (01/18/2022)   Humiliation, Afraid, Rape, and Kick questionnaire    Fear of Current or Ex-Partner: No    Emotionally Abused: No    Physically Abused: No    Sexually Abused: No    Physical Exam      Future Appointments  Date Time Provider [place]  09/24/2022 11:00 AM [name], [name], MD [facility]  10/20/2022 10:20 AM Bensimhon, Shaune Pascal, MD [facility] None  10/25/2022  1:30 PM Narda Amber K, DO [facility] None

## 2022-09-01 ENCOUNTER — Telehealth: Payer: Self-pay | Admitting: Family Medicine

## 2022-09-01 ENCOUNTER — Other Ambulatory Visit (HOSPITAL_COMMUNITY): Payer: Self-pay

## 2022-09-01 ENCOUNTER — Other Ambulatory Visit (HOSPITAL_COMMUNITY): Payer: Self-pay | Admitting: Emergency Medicine

## 2022-09-01 MED ORDER — DEXCOM G7 RECEIVER DEVI
3 refills | Status: DC
  Filled 2022-09-01: qty 1, 30d supply, fill #0

## 2022-09-01 MED ORDER — DEXCOM G7 SENSOR MISC
2 refills | Status: DC
  Filled 2022-09-01: qty 3, 30d supply, fill #0

## 2022-09-01 NOTE — Telephone Encounter (Signed)
Prescription Request  09/01/2022  LOV: 05/25/2022  What is the name of the medication or equipment?   Continuous Blood Gluc Receiver (DEXCOM G7 RECEIVER) DEVI Continuous Blood Gluc Sensor (DEXCOM G7 SENSOR) MIS  Have you contacted your pharmacy to request a refill? Yes   Caller stated that the pharmacy is waiting for the PA for this refill.   Which pharmacy would you like this sent to?   [place] [facility] Phone: [phone]  Fax: [phone]     Patient notified that their request is being sent to the clinical staff for review and that they should receive a response within 2 business days.   Please advise at Mobile [phone] (mobile)

## 2022-09-01 NOTE — Telephone Encounter (Signed)
Rx's sent in & PA pending via covermymeds.

## 2022-09-01 NOTE — Progress Notes (Signed)
Paramedicine Encounter    Patient ID: Thomas West, male    DOB: 09-30-1960, 62 y.o.   MRN: [id number]   Complaints***  Assessment***  Compliance with meds***  Pill box filled***  Refills needed dex com  Meds changes since last visit***    Social changes***   There were no vitals taken for this visit. Weight yesterday-*** Last visit weight-***  ACTION: {Paramed Action:[phone]}  Beatrix Shipper, EMT-Paramedic [phone] 09/01/22  Patient Care Team: [name], [name], MD as PCP - General (Family Medicine) Bensimhon, Bevelyn Buckles, MD as PCP - Cardiology (Cardiology) [place]ale Chard, DO as Consulting Physician (Neurology)  Patient Active Problem List   Diagnosis Date Noted  . 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  . Pruritic rash 05/25/2022  . Polyneuropathy associated with underlying disease 01/15/2022  . Atherosclerosis of aorta 01/15/2022  . History of CVA (cerebrovascular accident) without residual deficits 12/24/2021  . AKI (acute kidney injury) 12/13/2021  . Diarrhea 12/13/2021  . Shortness of breath 12/13/2021  . Chest pain 12/13/2021  . Nausea and vomiting 12/13/2021  . Heart failure 12/08/2021  . Hypertension associated with diabetes 07/12/2021  . Chronic kidney disease, stage 3a 07/12/2021  . Hyperkalemia 07/12/2021  . CHF exacerbation 04/20/2021  . Acute on chronic combined systolic and diastolic CHF (congestive heart failure) 03/09/2021  . Acute on chronic HFrEF (heart failure with reduced ejection fraction) 03/08/2021  . HFrEF (heart failure with reduced ejection fraction) 02/03/2021  . Diabetes mellitus 03/27/2019  . Hyperlipidemia associated with type 2 diabetes mellitus 03/27/2019  . CAD (coronary artery disease) 03/05/2019  . History of MI (myocardial infarction) 03/05/2019  . Type 2 diabetes mellitus with diabetic neuropathy, unspecified 03/05/2019  . HLD (hyperlipidemia) 03/05/2019  . GERD (gastroesophageal reflux disease) 03/05/2019  . Hypertension,  essential, benign 03/05/2019    Current Outpatient Medications:  .  aspirin EC 81 MG tablet, Take 1 tablet (81 mg total) by mouth daily. Swallow whole., Disp: 30 tablet, Rfl: 12 .  atorvastatin (LIPITOR) 80 MG tablet, Take 1 tablet (80 mg total) by mouth daily., Disp: 90 tablet, Rfl: 1 .  clopidogrel (PLAVIX) 75 MG tablet, Take 1 tablet (75 mg total) by mouth daily., Disp: 30 tablet, Rfl: 3 .  Continuous Blood Gluc Receiver (DEXCOM G7 RECEIVER) DEVI, Use to check blood sugars, Disp: 3 each, Rfl: 3 .  Continuous Blood Gluc Sensor (DEXCOM G7 SENSOR) MISC, USE TO CHECK BLOOD SUGARS, Disp: 3 each, Rfl: 2 .  cyanocobalamin 1000 MCG tablet, TAKE ONE TABLET BY MOUTH DAILY, Disp: 30 tablet, Rfl: 1 .  DULoxetine (CYMBALTA) 30 MG capsule, Take 1 capsule (30 mg total) by mouth daily., Disp: 30 capsule, Rfl: 3 .  empagliflozin (JARDIANCE) 10 MG TABS tablet, Take 1 tablet (10 mg total) by mouth daily., Disp: 30 tablet, Rfl: 3 .  empagliflozin (JARDIANCE) 25 MG TABS tablet, Take 1 tablet (25 mg total) by mouth daily before breakfast., Disp: 90 tablet, Rfl: 1 .  hydrALAZINE (APRESOLINE) 50 MG tablet, Take 1.5 tablets (75 mg total) by mouth 3 (three) times daily., Disp: 135 tablet, Rfl: 11 .  hydrocortisone cream 1 %, Apply 1 Application topically 2 (two) times daily as needed for itching., Disp: 45 g, Rfl: 1 .  isosorbide mononitrate (IMDUR) 30 MG 24 hr tablet, Take 1 tablet (30 mg total) by mouth daily., Disp: 30 tablet, Rfl: 6 .  ondansetron (ZOFRAN) 4 MG tablet, Take 1 tablet (4 mg total) by mouth every 8 (eight) hours as needed for nausea or vomiting., Disp: 12 tablet,  Rfl: 0 .  spironolactone (ALDACTONE) 25 MG tablet, Take 1 tablet (25 mg total) by mouth daily., Disp: 30 tablet, Rfl: 11 .  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  torsemide (DEMADEX) 20 MG tablet, Take 1 tablet (20 mg total) by mouth daily., Disp: 30 tablet, Rfl: 3 .  carvedilol (COREG) 6.25 MG  tablet, Take 1 tablet (6.25 mg total) by mouth EVERY 12 HOURS (10AM &10PM), Disp: 60 tablet, Rfl: 6 .  empagliflozin (JARDIANCE) 25 MG TABS tablet, Take 1 tablet by mouth once daily before breakfast (Patient not taking: Reported on 09/01/2022), Disp: 90 tablet, Rfl: 0 .  sacubitril-valsartan (ENTRESTO) 24-26 MG, Take 1 tablet by mouth 2 (two) times daily., Disp: 60 tablet, Rfl: 3 Allergies  Allergen Reactions  . Bee Venom Anaphylaxis, Swelling and Other (See Comments)    Swells all over     Social History   Socioeconomic History  . Marital status: Single    Spouse name: Not on file  . Number of children: Not on file  . Years of education: Not on file  . Highest education level: Not on file  Occupational History  . Not on file  Tobacco Use  . Smoking status: Former    Types: Cigarettes    Quit date: 10/06/2017    Years since quitting: 4.9  . Smokeless tobacco: Never  Substance and Sexual Activity  . Alcohol use: Yes    Comment: Occasional Beer  . Drug use: Yes    Types: Marijuana  . Sexual activity: Not on file  Other Topics Concern  . Not on file  Social History Narrative   Right Handed.    Lives in a two story home    Lives with 53 year old son.    Social Determinants of Health   Financial Resource Strain: Low Risk  (01/18/2022)   Overall Financial Resource Strain (CARDIA)   . Difficulty of Paying Living Expenses: Not hard at all  Food Insecurity: No Food Insecurity (06/04/2022)   Hunger Vital Sign   . Worried About Programme researcher, broadcasting/film/video in the Last Year: Never true   . Ran Out of Food in the Last Year: Never true  Transportation Needs: No Transportation Needs (06/04/2022)   PRAPARE - Transportation   . Lack of Transportation (Medical): No   . Lack of Transportation (Non-Medical): No  Physical Activity: Inactive (01/18/2022)   Exercise Vital Sign   . Days of Exercise per Week: 0 days   . Minutes of Exercise per Session: 0 min  Stress: Stress Concern Present  (01/18/2022)   [employer] of Occupational Health - Occupational Stress Questionnaire   . Feeling of Stress : To some extent  Social Connections: Socially Isolated (01/18/2022)   Social Connection and Isolation Panel [NHANES]   . Frequency of Communication with Friends and Family: More than three times a week   . Frequency of Social Gatherings with Friends and Family: More than three times a week   . Attends Religious Services: Never   . Active Member of Clubs or Organizations: No   . Attends Banker Meetings: Never   . Marital Status: Never married  Intimate Partner Violence: Not At Risk (01/18/2022)   Humiliation, Afraid, Rape, and Kick questionnaire   . Fear of Current or Ex-Partner: No   . Emotionally Abused: No   . Physically Abused: No   . Sexually Abused: No    Physical Exam  Future Appointments  Date Time Provider Department Center  09/24/2022 11:00 AM [place], [name][facility]  10/20/2022 10:20 AM Bensimhon, Bevelyn Bucklesaniel R, MD [facility] None  10/25/2022  1:30 PM Nita SicklePatel, Donika K, DO [facility] None

## 2022-09-02 ENCOUNTER — Telehealth: Payer: Self-pay

## 2022-09-02 ENCOUNTER — Telehealth (HOSPITAL_COMMUNITY): Payer: Self-pay | Admitting: Emergency Medicine

## 2022-09-02 ENCOUNTER — Other Ambulatory Visit (HOSPITAL_COMMUNITY): Payer: Self-pay

## 2022-09-02 ENCOUNTER — Other Ambulatory Visit: Payer: Self-pay

## 2022-09-02 NOTE — Progress Notes (Signed)
Patient ID: Thomas West, male   DOB: 11/16/1960, 62 y.o.   MRN: [id number]  Care Management & Coordination Services Pharmacy Team  Reason for Encounter: Attempt to reschedule Initial with Johny Drilling D   Contacted patient for general health update and medication adherence call.  Spoke with patient on 09/02/2022    Call to patient offered appointment with Johny Drilling D, pt in agreement.   Medications: Outpatient Encounter Medications as of 09/02/2022  Medication Sig Note   aspirin EC 81 MG tablet Take 1 tablet (81 mg total) by mouth daily. Swallow whole.    atorvastatin (LIPITOR) 80 MG tablet Take 1 tablet (80 mg total) by mouth daily.    carvedilol (COREG) 6.25 MG tablet Take 1 tablet (6.25 mg total) by mouth EVERY 12 HOURS (10AM &10PM)    clopidogrel (PLAVIX) 75 MG tablet Take 1 tablet (75 mg total) by mouth daily.    Continuous Blood Gluc Receiver (DEXCOM G7 RECEIVER) DEVI Use to check blood sugars    Continuous Blood Gluc Sensor (DEXCOM G7 SENSOR) MISC Use to check blood sugars.    cyanocobalamin 1000 MCG tablet TAKE ONE TABLET BY MOUTH DAILY    DULoxetine (CYMBALTA) 30 MG capsule Take 1 capsule (30 mg total) by mouth daily.    empagliflozin (JARDIANCE) 10 MG TABS tablet Take 1 tablet (10 mg total) by mouth daily.    empagliflozin (JARDIANCE) 25 MG TABS tablet Take 1 tablet (25 mg total) by mouth daily before breakfast.    empagliflozin (JARDIANCE) 25 MG TABS tablet Take 1 tablet by mouth once daily before breakfast (Patient not taking: Reported on 09/01/2022)    hydrALAZINE (APRESOLINE) 50 MG tablet Take 1.5 tablets (75 mg total) by mouth 3 (three) times daily.    hydrocortisone cream 1 % Apply 1 Application topically 2 (two) times daily as needed for itching.    isosorbide mononitrate (IMDUR) 30 MG 24 hr tablet Take 1 tablet (30 mg total) by mouth daily.    ondansetron (ZOFRAN) 4 MG tablet Take 1 tablet (4 mg total) by mouth every 8 (eight) hours as needed for nausea or  vomiting. 09/01/2022: prn   sacubitril-valsartan (ENTRESTO) 24-26 MG Take 1 tablet by mouth 2 (two) times daily.    spironolactone (ALDACTONE) 25 MG tablet Take 1 tablet (25 mg total) by mouth daily.    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    torsemide (DEMADEX) 20 MG tablet Take 1 tablet (20 mg total) by mouth daily.    No facility-administered encounter medications on file as of 09/02/2022.    Recent vitals BP Readings from Last 3 Encounters:  09/01/22 (!) 140/90  08/24/22 (!) 180/100  08/12/22 (!) 190/120   Pulse Readings from Last 3 Encounters:  09/01/22 80  08/24/22 80  08/11/22 84   Wt Readings from Last 3 Encounters:  08/24/22 164 lb 6.4 oz (74.6 kg)  08/11/22 161 lb 6.4 oz (73.2 kg)  08/05/22 156 lb (70.8 kg)   BMI Readings from Last 3 Encounters:  08/24/22 24.28 kg/m  08/11/22 23.83 kg/m  08/05/22 23.04 kg/m    Recent lab results    Component Value Date/Time   NA 137 06/22/2022 1042   NA 135 01/02/2021 1308   K 4.1 06/22/2022 1042   CL 101 06/22/2022 1042   CO2 26 06/22/2022 1042   GLUCOSE 213 (H) 06/22/2022 1042   BUN 16 06/22/2022 1042   BUN 18 01/02/2021 1308   CREATININE 1.48 (H) 06/22/2022 1042  CALCIUM 9.0 06/22/2022 1042    Lab Results  Component Value Date   CREATININE 1.48 (H) 06/22/2022   GFR 52.93 (L) 02/03/2021   EGFR 60 01/02/2021   GFRNONAA 54 (L) 06/22/2022   GFRAA 59 (L) 02/06/2018   Lab Results  Component Value Date/Time   HGBA1C 7.5 (A) 05/25/2022 11:27 AM   HGBA1C 7.3 (H) 12/13/2021 06:40 AM   HGBA1C 11.7 (H) 04/20/2021 11:00 AM   FRUCTOSAMINE 436 (H) 02/03/2021 09:11 AM   MICROALBUR 165.1 (H) 02/03/2021 09:11 AM    Lab Results  Component Value Date   CHOL 138 03/13/2021   HDL 40 (L) 03/13/2021   LDLCALC 86 03/13/2021   TRIG 60 03/13/2021   CHOLHDL 3.5 03/13/2021       Pamala DuffelLaresia Green CMA Clinical Pharmacist Assistant [phone]

## 2022-09-02 NOTE — Telephone Encounter (Signed)
Followed up with [facility] to confirm that refill/pre authorization had been received and advised it had.  Was advised this should be received by the first of next week.  I will follow up with same.    Beatrix Shipper, EMT-Paramedic [phone] 09/02/2022

## 2022-09-06 ENCOUNTER — Other Ambulatory Visit (HOSPITAL_COMMUNITY): Payer: Self-pay

## 2022-09-06 MED ORDER — CLOPIDOGREL BISULFATE 75 MG PO TABS
75.0000 mg | ORAL_TABLET | Freq: Every day | ORAL | 0 refills | Status: DC
  Filled 2022-09-06: qty 90, 90d supply, fill #0

## 2022-09-07 ENCOUNTER — Telehealth (HOSPITAL_COMMUNITY): Payer: Self-pay | Admitting: Emergency Medicine

## 2022-09-07 ENCOUNTER — Other Ambulatory Visit (HOSPITAL_COMMUNITY): Payer: Self-pay | Admitting: Family Medicine

## 2022-09-07 NOTE — Telephone Encounter (Signed)
Received notification from Thomas West office that Thomas West denied pre-authorization  for his DexCom7 because he is not on insulin.  Thomas West notified.  Thomas West, EMT-Paramedic [phone] 09/07/2022

## 2022-09-08 ENCOUNTER — Other Ambulatory Visit (HOSPITAL_COMMUNITY): Payer: Self-pay | Admitting: Emergency Medicine

## 2022-09-08 NOTE — Telephone Encounter (Signed)
Created in error

## 2022-09-08 NOTE — Progress Notes (Signed)
Paramedicine Encounter    Patient ID: Thomas West, male    DOB: 1960/12/15, 62 y.o.   MRN: [id number]   Complaints NONE  Assessment A&O x 4, skin W&D w/ good color.  Lung sounds with some mild ronchi in upper lobes which resolves with coughing.  He denies productive cough.  No peripheral edema noted.  Compliance with meds 100%  Pill box filled x 1 week  Refills needed NONE  Meds changes since last visit NONE    Social changes NONE   BP (!) 130/90 (BP Location: Left Arm, Patient Position: Sitting, Cuff Size: Normal)   Pulse 78   Resp 14   Wt 155 lb 9.6 oz (70.6 kg)   SpO2 96%   BMI 22.98 kg/m  Weight yesterday-not taken Last visit weight-156lb  Home visit today finds Thomas West without complaint of chest pain or SOB.  He states, "I'm feeling good".  Med box reconciled x 1 week and his compliance has been 100%.  BP was elevated today despite med compliance but asymptomatic.   Today I advised him that his PCP had sent me a message to let him know that [facility] had declined his pre-authorization for his DexCom 7 because he is not insulin dependent.  He became very angry and started yelling.  I advised him to reach out to [facility] himself to get more information pertaining to the denial.     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   Pruritic rash 05/25/2022   Polyneuropathy associated with underlying disease 01/15/2022   Atherosclerosis of aorta 01/15/2022   History of CVA (cerebrovascular accident) without residual deficits 12/24/2021   AKI (acute kidney injury) 12/13/2021   Diarrhea 12/13/2021   Shortness of breath 12/13/2021   Chest pain 12/13/2021   Nausea  and vomiting 12/13/2021   Heart failure 12/08/2021   Hypertension associated with diabetes 07/12/2021   Chronic kidney disease, stage 3a 07/12/2021   Hyperkalemia 07/12/2021   CHF exacerbation 04/20/2021   Acute on chronic combined systolic and diastolic CHF (congestive heart failure) 03/09/2021   Acute on chronic HFrEF (heart failure with reduced ejection fraction) 03/08/2021   HFrEF (heart failure with reduced ejection fraction) 02/03/2021   Diabetes mellitus 03/27/2019   Hyperlipidemia associated with type 2 diabetes mellitus 03/27/2019   CAD (coronary artery disease) 03/05/2019   History of MI (myocardial infarction) 03/05/2019   Type 2 diabetes mellitus with diabetic neuropathy, unspecified 03/05/2019   HLD (hyperlipidemia) 03/05/2019   GERD (gastroesophageal reflux disease) 03/05/2019   Hypertension, essential, benign 03/05/2019    Current Outpatient Medications:    aspirin EC 81 MG tablet, Take 1 tablet (81 mg total) by mouth daily. Swallow whole., Disp: 30 tablet, Rfl: 12   atorvastatin (LIPITOR) 80 MG tablet, Take 1 tablet (80 mg total) by mouth daily., Disp: 90 tablet, Rfl: 1   clopidogrel (PLAVIX) 75 MG tablet, Take 1 tablet (75 mg total) by mouth daily., Disp: 30 tablet, Rfl: 3   clopidogrel (PLAVIX) 75 MG tablet, Take 1 tablet (75 mg total) by mouth daily., Disp: 90 tablet, Rfl: 0   cyanocobalamin 1000 MCG tablet, TAKE ONE TABLET BY MOUTH DAILY, Disp: 30 tablet, Rfl: 1   DULoxetine (CYMBALTA) 30 MG capsule,  Take 1 capsule (30 mg total) by mouth daily., Disp: 30 capsule, Rfl: 3   empagliflozin (JARDIANCE) 25 MG TABS tablet, Take 1 tablet (25 mg total) by mouth daily before breakfast., Disp: 90 tablet, Rfl: 1   hydrALAZINE (APRESOLINE) 50 MG tablet, Take 1.5 tablets (75 mg total) by mouth 3 (three) times daily., Disp: 135 tablet, Rfl: 11   hydrocortisone cream 1 %, Apply 1 Application topically 2 (two) times daily as needed for itching., Disp: 45 g, Rfl: 1   isosorbide  mononitrate (IMDUR) 30 MG 24 hr tablet, Take 1 tablet (30 mg total) by mouth daily., Disp: 30 tablet, Rfl: 6   ondansetron (ZOFRAN) 4 MG tablet, Take 1 tablet (4 mg total) by mouth every 8 (eight) hours as needed for nausea or vomiting., Disp: 12 tablet, Rfl: 0   sacubitril-valsartan (ENTRESTO) 24-26 MG, Take 1 tablet by mouth 2 (two) times daily., Disp: 60 tablet, Rfl: 3   spironolactone (ALDACTONE) 25 MG tablet, Take 1 tablet (25 mg total) by mouth daily., Disp: 30 tablet, Rfl: 11   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, Take 1 capsule by mouth daily., Disp: 30 capsule, Rfl: 11   torsemide (DEMADEX) 20 MG tablet, Take 1 tablet (20 mg total) by mouth daily., Disp: 30 tablet, Rfl: 3   carvedilol (COREG) 6.25 MG tablet, Take 1 tablet (6.25 mg total) by mouth EVERY 12 HOURS (10AM &10PM), Disp: 60 tablet, Rfl: 6   Continuous Glucose Receiver (DEXCOM G7 RECEIVER) DEVI, Use to check blood sugars (Patient not taking: Reported on 09/08/2022), Disp: 3 each, Rfl: 3   Continuous Glucose Sensor (DEXCOM G7 SENSOR) MISC, Use to check blood sugars. (Patient not taking: Reported on 09/08/2022), Disp: 3 each, Rfl: 2   empagliflozin (JARDIANCE) 10 MG TABS tablet, Take 1 tablet (10 mg total) by mouth daily. (Patient not taking: Reported on 09/08/2022), Disp: 30 tablet, Rfl: 3   empagliflozin (JARDIANCE) 25 MG TABS tablet, Take 1 tablet by mouth once daily before breakfast (Patient not taking: Reported on 09/01/2022), Disp: 90 tablet, Rfl: 0 Allergies  Allergen Reactions   Bee Venom Anaphylaxis, Swelling and Other (See Comments)    Swells all over     Social History   Socioeconomic History   Marital status: Single    Spouse name: Not on file   Number of children: Not on file   Years of education: Not on file   Highest education level: Not on file  Occupational History   Not on file  Tobacco Use   Smoking status: Former    Types: Cigarettes    Quit date: 10/06/2017    Years since quitting: 4.9   Smokeless tobacco: Never   Substance and Sexual Activity   Alcohol use: Yes    Comment: Occasional Beer   Drug use: Yes    Types: Marijuana   Sexual activity: Not on file  Other Topics Concern   Not on file  Social History Narrative   Right Handed.    Lives in a two story home    Lives with 75 year old son.    Social Determinants of Health   Financial Resource Strain: Low Risk  (01/18/2022)   Overall Financial Resource Strain (CARDIA)    Difficulty of Paying Living Expenses: Not hard at all  Food Insecurity: No Food Insecurity (06/04/2022)   Hunger Vital Sign    Worried About Running Out of Food in the Last Year: Never true    Ran Out of Food in the Last Year: Never true  Transportation Needs: No Transportation Needs (06/04/2022)   PRAPARE - Administrator, Civil Service (Medical): No    Lack of Transportation (Non-Medical): No  Physical Activity: Inactive (01/18/2022)   Exercise Vital Sign    Days of Exercise per Week: 0 days    Minutes of Exercise per Session: 0 min  Stress: Stress Concern Present (01/18/2022)   [employer] of Occupational Health - Occupational Stress Questionnaire    Feeling of Stress : To some extent  Social Connections: Socially Isolated (01/18/2022)   Social Connection and Isolation Panel [NHANES]    Frequency of Communication with Friends and Family: More than three times a week    Frequency of Social Gatherings with Friends and Family: More than three times a week    Attends Religious Services: Never    Database administrator or Organizations: No    Attends Banker Meetings: Never    Marital Status: Never married  Intimate Partner Violence: Not At Risk (01/18/2022)   Humiliation, Afraid, Rape, and Kick questionnaire    Fear of Current or Ex-Partner: No    Emotionally Abused: No    Physically Abused: No    Sexually Abused: No    Physical Exam      Future Appointments  Date Time Provider Department Center  09/21/2022  8:30 AM Sherrill Raring, RPH [facility] None  09/24/2022 11:00 AM [name], [name], MD [facility]  10/20/2022 10:20 AM Bensimhon, Bevelyn Buckles, MD [facility] None  10/25/2022  1:30 PM Nita Sickle K, DO [facility] None

## 2022-09-15 ENCOUNTER — Other Ambulatory Visit (HOSPITAL_COMMUNITY): Payer: Self-pay | Admitting: Emergency Medicine

## 2022-09-15 ENCOUNTER — Telehealth (HOSPITAL_COMMUNITY): Payer: Self-pay | Admitting: Licensed Clinical Social Worker

## 2022-09-15 ENCOUNTER — Other Ambulatory Visit: Payer: Self-pay

## 2022-09-15 ENCOUNTER — Other Ambulatory Visit (HOSPITAL_COMMUNITY): Payer: Self-pay

## 2022-09-15 NOTE — Progress Notes (Signed)
Paramedicine Encounter    Patient ID: Thomas West, male    DOB: [birth year], 62 y.o.   MRN: [id number]   Complaints NONE  Assessment A&O x 4, skin W&D w/ good color.  No peripheral edema noted.  Lung sounds clear throughout  Compliance with meds missed 1 a.m. dose of meds  Pill box filled x 1 week  Refills needed Carvedilol, Torsemide, Isosorbide, Spiro  Meds changes since last visit none    Social changes none   BP 110/70 (BP Location: Left Arm, Patient Position: Sitting, Cuff Size: Normal)   Pulse 74   Resp 16   Wt 155 lb 12.8 oz (70.7 kg)   SpO2 95%   BMI 23.01 kg/m  Weight yesterday-not taken Last visit weight-155lb  ACTION: Home visit completed  [name] [phone] 09/15/22  Patient Care Team: [name], [name], MD as PCP - General (Family Medicine) Bensimhon, Bevelyn Buckles, MD as PCP - Cardiology (Cardiology) Glendale Chard, DO as Consulting Physician (Neurology)  Patient Active Problem List   Diagnosis Date Noted   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   Pruritic rash 05/25/2022   Polyneuropathy associated with underlying disease 01/15/2022   Atherosclerosis of aorta 01/15/2022   History of CVA (cerebrovascular accident) without residual deficits 12/24/2021   AKI (acute kidney injury) 12/13/2021   Diarrhea 12/13/2021   Shortness of breath 12/13/2021   Chest pain 12/13/2021   Nausea and vomiting 12/13/2021   Heart failure 12/08/2021   Hypertension associated with diabetes 07/12/2021   Chronic kidney disease, stage 3a 07/12/2021   Hyperkalemia 07/12/2021   CHF exacerbation 04/20/2021   Acute on chronic combined systolic and diastolic CHF (congestive heart failure) 03/09/2021   Acute on chronic HFrEF (heart failure with reduced ejection fraction) 03/08/2021   HFrEF (heart failure with reduced ejection fraction) 02/03/2021   Diabetes mellitus 03/27/2019   Hyperlipidemia associated with type 2 diabetes mellitus 03/27/2019   CAD (coronary artery  disease) 03/05/2019   History of MI (myocardial infarction) 03/05/2019   Type 2 diabetes mellitus with diabetic neuropathy, unspecified 03/05/2019   HLD (hyperlipidemia) 03/05/2019   GERD (gastroesophageal reflux disease) 03/05/2019   Hypertension, essential, benign 03/05/2019    Current Outpatient Medications:    aspirin EC 81 MG tablet, Take 1 tablet (81 mg total) by mouth daily. Swallow whole., Disp: 30 tablet, Rfl: 12   atorvastatin (LIPITOR) 80 MG tablet, Take 1 tablet (80 mg total) by mouth daily., Disp: 90 tablet, Rfl: 1   clopidogrel (PLAVIX) 75 MG tablet, Take 1 tablet (75 mg total) by mouth daily., Disp: 90 tablet, Rfl: 0   cyanocobalamin 1000 MCG tablet, TAKE ONE TABLET BY MOUTH DAILY, Disp: 30 tablet, Rfl: 1   DULoxetine (CYMBALTA) 30 MG capsule, Take 1 capsule (30 mg total) by mouth daily., Disp: 30 capsule, Rfl: 3   empagliflozin (JARDIANCE) 25 MG TABS tablet, Take 1 tablet by mouth once daily before breakfast, Disp: 90 tablet, Rfl: 0   hydrALAZINE (APRESOLINE) 50 MG tablet, Take 1.5 tablets (75 mg total) by mouth 3 (three) times daily., Disp: 135 tablet, Rfl: 11   hydrocortisone cream 1 %, Apply 1 Application topically 2 (two) times daily as needed for itching., Disp: 45 g, Rfl: 1   isosorbide mononitrate (IMDUR) 30 MG 24 hr tablet, Take 1 tablet (30 mg total) by mouth daily., Disp: 30 tablet, Rfl: 6   ondansetron (ZOFRAN) 4 MG tablet, Take 1 tablet (4 mg total) by mouth every 8 (eight) hours as needed for nausea or vomiting., Disp: 12  tablet, Rfl: 0   sacubitril-valsartan (ENTRESTO) 24-26 MG, Take 1 tablet by mouth 2 (two) times daily., Disp: 60 tablet, Rfl: 3   spironolactone (ALDACTONE) 25 MG tablet, Take 1 tablet (25 mg total) by mouth daily., Disp: 30 tablet, Rfl: 11   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, Take 1 capsule by mouth daily., Disp: 30 capsule, Rfl: 11   torsemide (DEMADEX) 20 MG tablet, Take 1 tablet (20 mg total) by mouth daily., Disp: 30 tablet, Rfl: 3   carvedilol (COREG)  6.25 MG tablet, Take 1 tablet (6.25 mg total) by mouth EVERY 12 HOURS (10AM &10PM), Disp: 60 tablet, Rfl: 6   clopidogrel (PLAVIX) 75 MG tablet, Take 1 tablet (75 mg total) by mouth daily., Disp: 30 tablet, Rfl: 3   Continuous Glucose Receiver (DEXCOM G7 RECEIVER) DEVI, Use to check blood sugars (Patient not taking: Reported on 09/08/2022), Disp: 3 each, Rfl: 3   Continuous Glucose Sensor (DEXCOM G7 SENSOR) MISC, Use to check blood sugars. (Patient not taking: Reported on 09/08/2022), Disp: 3 each, Rfl: 2   empagliflozin (JARDIANCE) 10 MG TABS tablet, Take 1 tablet (10 mg total) by mouth daily. (Patient not taking: Reported on 09/15/2022), Disp: 30 tablet, Rfl: 3   empagliflozin (JARDIANCE) 25 MG TABS tablet, Take 1 tablet (25 mg total) by mouth daily before breakfast. (Patient not taking: Reported on 09/15/2022), Disp: 90 tablet, Rfl: 1 Allergies  Allergen Reactions   Bee Venom Anaphylaxis, Swelling and Other (See Comments)    Swells all over     Social History   Socioeconomic History   Marital status: Single    Spouse name: Not on file   Number of children: Not on file   Years of education: Not on file   Highest education level: Not on file  Occupational History   Not on file  Tobacco Use   Smoking status: Former    Types: Cigarettes    Quit date: 10/06/2017    Years since quitting: 4.9   Smokeless tobacco: Never  Substance and Sexual Activity   Alcohol use: Yes    Comment: Occasional Beer   Drug use: Yes    Types: Marijuana   Sexual activity: Not on file  Other Topics Concern   Not on file  Social History Narrative   Right Handed.    Lives in a two story home    Lives with 17 year old son.    Social Determinants of Health   Financial Resource Strain: Low Risk  (01/18/2022)   Overall Financial Resource Strain (CARDIA)    Difficulty of Paying Living Expenses: Not hard at all  Food Insecurity: No Food Insecurity (06/04/2022)   Hunger Vital Sign    Worried About Running Out  of Food in the Last Year: Never true    Ran Out of Food in the Last Year: Never true  Transportation Needs: No Transportation Needs (06/04/2022)   PRAPARE - Administrator, Civil Service (Medical): No    Lack of Transportation (Non-Medical): No  Physical Activity: Inactive (01/18/2022)   Exercise Vital Sign    Days of Exercise per Week: 0 days    Minutes of Exercise per Session: 0 min  Stress: Stress Concern Present (01/18/2022)   [employer] of Occupational Health - Occupational Stress Questionnaire    Feeling of Stress : To some extent  Social Connections: Socially Isolated (01/18/2022)   Social Connection and Isolation Panel [NHANES]    Frequency of Communication with Friends and Family: More than three times  a week    Frequency of Social Gatherings with Friends and Family: More than three times a week    Attends Religious Services: Never    Database administrator or Organizations: No    Attends Banker Meetings: Never    Marital Status: Never married  Intimate Partner Violence: Not At Risk (01/18/2022)   Humiliation, Afraid, Rape, and Kick questionnaire    Fear of Current or Ex-Partner: No    Emotionally Abused: No    Physically Abused: No    Sexually Abused: No    Physical Exam      Future Appointments  Date Time Provider Department Center  09/21/2022  8:30 AM Sherrill Raring, RPH [facility] None  09/24/2022 11:00 AM [name], [name], MD [facility]  10/20/2022 10:20 AM Bensimhon, Bevelyn Buckles, MD [facility] None  10/25/2022  1:30 PM Nita Sickle K, DO [facility] None

## 2022-09-15 NOTE — Telephone Encounter (Signed)
HF Paramedicine Team Based Care Meeting  HF MD- NA  HF NP - Amy Clegg NP-C   [facility] HF Paramedicine  Katie Lynch Pranay Hilbun  [name] Smith  [facility] admit within the last 30 days for heart failure?  no  Medications concerns? Does not participate in care consistently so not filling box but is improving with compliance  Education needs? Nothing at this time  SDOH - switched insurance again so figuring out getting all his meds  Eligible for discharge? Think that if paramedic stops going out his compliance will drop.   Burna Sis, LCSW Clinical Social Worker [facility] Desk#: [phone] Cell#: [phone]

## 2022-09-20 ENCOUNTER — Telehealth: Payer: Self-pay

## 2022-09-20 NOTE — Progress Notes (Signed)
Care Management & Coordination Services Pharmacy Team  Reason for Encounter: Appointment Reminder  Contacted patient to confirm telephone appointment with Delano Metz, PharmD on 09/21/2022 at 8:30. Unsuccessful outreach. Left voicemail for patient to return call.   Care Gaps: AWV - completed 01/18/2022 Last eye exam - never done Last foot exam - 02/03/2021 Last BP - 110/70 on 09/15/2022 Last A1C - 7.5 on 05/25/2022 Covid - never done Colonoscopy - never done Shingrix - postponed  Star Rating Drugs -  Atorvastatin 80 mg - last filled 08/24/2022 90 DS at [facility] 25 mg - last filled 08/02/2022 90 DS at [facility] C[facility]  Clinical Pharmacist Assistant [phone]

## 2022-09-20 NOTE — Progress Notes (Unsigned)
Care Management & Coordination Services Pharmacy Note  09/21/2022 Name:  Thomas West MRN:  [id number] DOB:  1960-10-20  Summary: BP at goal <130/80 A1C not at goal <70 LDL not at goal <70, last checked 03/13/21  Recommendations/Changes made from today's visit: -Continue to check BP at least once weekly and keep a log -Counseled to be mindful of sodium intake, as this can elevate BP and lead to fluid retention -Counseled to check sugars BID and keep a log -ORDER updated lipid panel, with PCP approval  Follow up plan: DM call in 1 month Pharmacist visit in 2-3 months   Subjective: Thomas West is an 62 y.o. year old male who is a primary patient of [name], [name] Expose, MD.  The care coordination team was consulted for assistance with disease management and care coordination needs.    Engaged with patient by telephone for initial visit.  Recent office visits: 05/25/22 [name], [name], MD - Patient presented for Type 2 diabetes mellitus with diabetic neuropathy without long term current use of insulin and other concerns. Prescribed Hydrocortisone. Increased Jardiance.    Recent consult visits:  08/05/22 Beatrix Shipper, Paramedic - Patient had home para medicine visit for CHF. No medication changes.    06/29/22 El-Khouri, Philippa Sicks, RD (Dietician) - Patient presented for Type 2 diabetes mellitus with diabetic neuropathy without long term current use of insulin. No medication changes.    06/22/22 MilfordAnderson Malta, FNP (Cardiology)- Patient presented for HFrEF and other concerns. No medication changes.    06/14/22 Glendale Chard, DO (Neurology) - Patient presented for Neuropathy amyloid. Prescribed Duloxetine.    04/08/22 Bensimhon, Bevelyn Buckles, MD (Cardiology) - Patient presented for HFrEF and other concerns. No medication changes  Hospital visits: 05/31/22 [facility] for bronchitis and other concerns. LOS 16 hours. START doxycycline, zofran 4mg  and percocet 5-325mg .  04/28/22 [facility]  ED for atypical chest pain. LOS 5 hours. No med changes   Objective:  Lab Results  Component Value Date   CREATININE 1.48 (H) 06/22/2022   BUN 16 06/22/2022   GFR 52.93 (L) 02/03/2021   EGFR 60 01/02/2021   GFRNONAA 54 (L) 06/22/2022   GFRAA 59 (L) 02/06/2018   NA 137 06/22/2022   K 4.1 06/22/2022   CALCIUM 9.0 06/22/2022   CO2 26 06/22/2022   GLUCOSE 213 (H) 06/22/2022    Lab Results  Component Value Date/Time   HGBA1C 7.5 (A) 05/25/2022 11:27 AM   HGBA1C 7.3 (H) 12/13/2021 06:40 AM   HGBA1C 11.7 (H) 04/20/2021 11:00 AM   FRUCTOSAMINE 436 (H) 02/03/2021 09:11 AM   GFR 52.93 (L) 02/03/2021 09:11 AM   MICROALBUR 165.1 (H) 02/03/2021 09:11 AM    Last diabetic Eye exam: No results found for: "HMDIABEYEEXA"  Last diabetic Foot exam: No results found for: "HMDIABFOOTEX"   Lab Results  Component Value Date   CHOL 138 03/13/2021   HDL 40 (L) 03/13/2021   LDLCALC 86 03/13/2021   TRIG 60 03/13/2021   CHOLHDL 3.5 03/13/2021       Latest Ref Rng & Units 06/01/2022    9:30 AM 12/12/2021    7:46 PM 12/07/2021    8:32 PM  Hepatic Function  Total Protein 6.5 - 8.1 g/dL 7.1  8.3  6.9   Albumin 3.5 - 5.0 g/dL 3.5  4.1  3.4   AST 15 - 41 U/L 17  25  33   ALT 0 - 44 U/L 15  20  34   Alk Phosphatase  38 - 126 U/L 71  117  143   Total Bilirubin 0.3 - 1.2 mg/dL 0.6  0.4  0.9   Bilirubin, Direct 0.0 - 0.2 mg/dL <1.6  0.1      Lab Results  Component Value Date/Time   TSH 0.782 04/21/2021 03:35 AM   TSH 1.37 02/03/2021 09:11 AM       Latest Ref Rng & Units 05/31/2022    9:43 PM 04/28/2022   12:10 PM 04/08/2022   10:41 AM  CBC  WBC 4.0 - 10.5 K/uL 8.8  8.1  7.3   Hemoglobin 13.0 - 17.0 g/dL 10.9  60.4  54.0   Hematocrit 39.0 - 52.0 % 40.8  40.7  39.6   Platelets 150 - 400 K/uL 323  351  342     Lab Results  Component Value Date/Time   VITAMINB12 408 08/18/2021 01:05 PM   VITAMINB12 558 03/12/2021 03:32 PM    Clinical ASCVD: Yes  The ASCVD Risk score (Arnett DK, et al.,  2019) failed to calculate for the following reasons:   The patient has a prior MI or stroke diagnosis        06/29/2022    9:07 AM 05/25/2022   11:26 AM 01/18/2022    3:52 PM  Depression screen PHQ 2/9  Decreased Interest 0 0 0  Down, Depressed, Hopeless 0 0 0  PHQ - 2 Score 0 0 0  Altered sleeping  1 0  Tired, decreased energy  0 0  Change in appetite  0 0  Feeling bad or failure about yourself   0 0  Trouble concentrating  0 0  Moving slowly or fidgety/restless  0 0  Suicidal thoughts  0 0  PHQ-9 Score  1 0  Difficult doing work/chores  Not difficult at all Not difficult at all     Social History   Tobacco Use  Smoking Status Former   Types: Cigarettes   Quit date: 10/06/2017   Years since quitting: 4.9  Smokeless Tobacco Never   BP Readings from Last 3 Encounters:  09/15/22 110/70  09/08/22 (!) 130/90  09/01/22 (!) 140/90   Pulse Readings from Last 3 Encounters:  09/15/22 74  09/08/22 78  09/01/22 80   Wt Readings from Last 3 Encounters:  09/15/22 155 lb 12.8 oz (70.7 kg)  09/08/22 155 lb 9.6 oz (70.6 kg)  09/01/22 156 lb 1.6 oz (70.8 kg)   BMI Readings from Last 3 Encounters:  09/15/22 23.01 kg/m  09/08/22 22.98 kg/m  09/01/22 23.05 kg/m    Allergies  Allergen Reactions   Bee Venom Anaphylaxis, Swelling and Other (See Comments)    Swells all over    Medications Reviewed Today     Reviewed by Sherrill Raring, RPH (Pharmacist) on 09/21/22 at [phone]  Med List Status: <None>   Medication Order Taking? Sig Documenting Provider Last Dose Status Informant  aspirin EC 81 MG tablet [id number] Yes Take 1 tablet (81 mg total) by mouth daily. Swallow whole. Alen Bleacher, NP Taking Active Self           Med Note Katrinka Blazing, [name]   Wed Mar 17, 2022  2:54 PM)    atorvastatin (LIPITOR) 80 MG tablet [id number] Yes Take 1 tablet (80 mg total) by mouth daily. Bensimhon, Bevelyn Buckles, MD Taking Active   carvedilol (COREG) 6.25 MG tablet [id number] Yes Take 1 tablet (6.25 mg  total) by mouth EVERY 12 HOURS (10AM &10PM) Alen Bleacher, NP Taking Active Self  clopidogrel (PLAVIX) 75 MG tablet [id number] Yes Take 1 tablet (75 mg total) by mouth daily. [name], [name], MD Taking Active   Continuous Glucose Receiver [facility] G7 [place]) [place] [id number]  Use to check blood sugars  Patient not taking: Reported on 09/08/2022   [name], [name], MD  Active            Med Note Katrinka Blazing, [name]   Wed Sep 08, 2022  9:12 AM) Pre authorization declined by [facility]  Continuous Glucose Sensor [facility] G7 SENSOR) [place] [id number]  Use to check blood sugars.  Patient not taking: Reported on 09/08/2022   [name], [name], MD  Active            Med Note Katrinka Blazing, [name]   Wed Sep 08, 2022  9:13 AM) Pre-authorization denied by [facility]   cyanocobalamin 1000 MCG tablet [id number] Yes TAKE ONE TABLET BY MOUTH DAILY [name], [name], MD Taking Active   DULoxetine (CYMBALTA) 30 MG capsule [id number] Yes Take 1 capsule (30 mg total) by mouth daily. Nita Sickle K, DO Taking Active   empagliflozin (JARDIANCE) 25 MG TABS tablet [id number] Yes Take 1 tablet (25 mg total) by mouth daily before breakfast. [name], [name], MD Taking Active   hydrALAZINE (APRESOLINE) 50 MG tablet [id number] Yes Take 1.5 tablets (75 mg total) by mouth 3 (three) times daily. [place], Anderson Malta, FNP Taking Active   hydrocortisone cream 1 % [id number] Yes Apply 1 Application topically 2 (two) times daily as needed for itching. [name], [name], MD Taking Active   isosorbide mononitrate (IMDUR) 30 MG 24 hr tablet [id number] Yes Take 1 tablet (30 mg total) by mouth daily. Alen Bleacher, NP Taking Active Self           Med Note Katrinka Blazing, [name]   Wed Feb 17, 2022 12:11 PM)    sacubitril-valsartan [facility]) 24-26 [place] [id number] Yes Take 1 tablet by mouth 2 (two) times daily. Bensimhon, Bevelyn Buckles, MD Taking Active   spironolactone (ALDACTONE) 25 MG tablet [id number] Yes Take 1 tablet (25 mg total) by mouth daily. Andrey Farmer, PA-C Taking Active    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 [id number] Yes Take 1 capsule by mouth daily. Bensimhon, Bevelyn Buckles, MD Taking Active Self  torsemide (DEMADEX) 20 MG tablet [id number] Yes Take 1 tablet (20 mg total) by mouth daily. [place], Anderson Malta, FNP Taking Active             SDOH:  (Social Determinants of Health) assessments and interventions performed: Yes SDOH Interventions    Flowsheet Row Care Coordination from 09/21/2022 in [facility] Telephone from 06/04/2022 in [facility] Care Coordination Telephone from 05/03/2022 in [facility] Care Coordination Clinical Support from 01/18/2022 in [facility] Coordination from 01/05/2022 in [facility] Care Coordination Patient Outreach Telephone from 12/31/2021 in [facility] Care Coordination  SDOH Interventions        Food Insecurity Interventions Intervention Not Indicated Intervention Not Indicated -- Intervention Not Indicated Intervention Not Indicated --  Housing Interventions Intervention Not Indicated -- -- Intervention Not Indicated Intervention Not Indicated --  Transportation Interventions -- Intervention Not Indicated Intervention Not Indicated Intervention Not Indicated Intervention Not Indicated Other (Comment)  [referred to transportation car not working]  Financial Strain Interventions -- -- -- Intervention Not Indicated -- --  Physical Activity Interventions -- -- -- Intervention Not Indicated -- --  Stress Interventions -- -- -- Intervention Not Indicated -- --  Social Connections Interventions -- -- -- Intervention Not Indicated -- --       Medication Assistance: None required.  Patient affirms current coverage meets needs.  Medication Access: Within the past 30 days, how often has patient missed a dose of medication? None Is a pillbox or other method used to improve adherence? Yes  Factors that may affect  medication adherence? visual impairment - son assists Are meds synced by current pharmacy? No  Are meds delivered by current pharmacy? No  Does patient experience delays in picking up medications due to transportation concerns? No   [facility] Reviewed: Is patient disadvantaged to use [facility]?: No  Current Rx insurance plan: [facility] Name and location of Current pharmacy:  [facility] - [place], [place] - [address] [place] [address] Phone: [phone] Fax: [phone]  [facility] [address] [place] [address] Phone: [phone] Fax: [phone]  [facility] - [place], [place] - [address] [facility] [address] [place] [address] Phone: [phone] Fax: [phone]  [facility] services reviewed with patient today?: No  Patient requests to transfer care to [facility]?: No  Reason patient declined to change pharmacies: Disadvantaged due to insurance/mail order  Compliance/Adherence/Medication fill history: Care Gaps: COVID Vaccine - Overdue Eye Exam - Overdue Colonoscopy - Overdue Foot Exam - Overdue Flu Vaccine - Postponed Zoster Vaccine - Postponed  Star-Rating Drugs: Atorvastatin 80 mg - Last filled 07/01/22 30 DS at [facility] 10 mg- Last filled 07/01/22 30 DS at [facility] 25 mg - Last filled 08/02/22 90 DS at [facility]     Assessment/Plan   Hypertension (BP goal <130/80) -Controlled -Current treatment: Hydralazine 50mg  TID Appropriate, Effective, Safe, Accessible See HF section -Medications previously tried: see heart failure section  -Current home readings: nurse comes out to home and checks once a week, WNL -Current dietary habits: mindful of salt intake -Current exercise habits: Not discussed -Denies hypotensive/hypertensive symptoms -Educated on BP goals and benefits of medications for prevention  of heart attack, stroke and kidney damage; Daily salt intake goal < 2300 mg; Importance of home blood pressure monitoring; Proper BP monitoring technique; -Counseled to monitor BP at home once weekly, document, and provide log at future appointments -Recommended to continue current medication  Hyperlipidemia: (LDL goal < 70) -Controlled -Current treatment: Atorvastatin 80mg  1 qd -Medications previously tried: None  -Current dietary patterns: Not discussed -Current exercise habits: Not discussed -Educated on Cholesterol goals;  Benefits of statin for ASCVD risk reduction; Importance of limiting foods high in cholesterol; -Recommended to continue current medication  Diabetes with Neuropathy (A1c goal <7%) -Uncontrolled -Current medications: Jardiance 25mg  1 qd Appropriate, Effective, Safe, Accessible -Medications previously tried: Glipizide, Metformin, Ozempic, Januvia  -Current home glucose readings Out of dexcom sensors and PA denied, has last one on and reports 123 this morning -Denies hypoglycemic/hyperglycemic symptoms -Current meal patterns:  Not discussed, patient was short for time this morning, will sch close f/u to go into more detail in June/July, has appt with PCP this week -Current exercise: see above -Educated on A1c and blood sugar goals; Benefits of routine self-monitoring of blood sugar; -Counseled to check feet daily and get yearly eye exams -Recommended to continue current medication  Heart Failure (Goal: manage symptoms and prevent exacerbations) -Controlled -Last ejection fraction: 30-35% (Date: 11/02/21) -HF type: HFrEF (EF < 40%) -NYHA Class: II (slight limitation of  activity) -AHA HF Stage: C (Heart disease and symptoms present) -Current treatment: Carvedilol 6.25mg  BID Appropriate, Effective, Safe, Accessible Jardiance 25mg  1 qd Appropriate, Effective, Safe, Accessible Entresto 24-26mg  BID Appropriate, Effective, Safe, Accessible Spironolactone 25mg   1 qd Appropriate, Effective, Safe, Accessible Torsemide 20mg  1 MWF Appropriate, Effective, Safe, Accessible -Medications previously tried: Lasix, Metoprolol -Current home BP/HR readings: see above -Current home daily weights: not discussed -Educated on Benefits of medications for managing symptoms and prolonging life Proper diuretic administration and potassium supplementation Importance of blood pressure control -Recommended to continue current medication   CAD (Goal: Slow progression of atherosclerosis (plaques / blockages) throughout your body to reduce risk of heart attack and strokes) -Not assessed today Current Medication Therapy:  Aspirin 81mg  1qd Appropriate, Effective, Safe, Accessible  Plavix 75mg  1 qd Appropriate, Effective, Safe, Accessible  Isosorbide Mono 30mg  1 qd Appropriate, Effective, Safe, Accessible   Chronic Kidney Disease Stage 3a  -All medications assessed for renal dosing and appropriateness in chronic kidney disease.  Amyloidosis  -Not assessed today -Current treatment  Tafamidis 61mg  1 qd Appropriate, Effective, Safe, Accessible    Sherrill Raring Clinical Pharmacist [phone]

## 2022-09-21 ENCOUNTER — Ambulatory Visit: Payer: Medicare HMO

## 2022-09-21 ENCOUNTER — Other Ambulatory Visit (HOSPITAL_COMMUNITY): Payer: Self-pay

## 2022-09-22 ENCOUNTER — Other Ambulatory Visit: Payer: Self-pay

## 2022-09-22 ENCOUNTER — Other Ambulatory Visit (HOSPITAL_COMMUNITY): Payer: Self-pay | Admitting: Emergency Medicine

## 2022-09-22 ENCOUNTER — Other Ambulatory Visit (HOSPITAL_COMMUNITY): Payer: Self-pay

## 2022-09-22 MED ORDER — DULOXETINE HCL 30 MG PO CPEP
30.0000 mg | ORAL_CAPSULE | Freq: Every day | ORAL | 3 refills | Status: DC
  Filled 2022-09-22: qty 30, 30d supply, fill #0

## 2022-09-22 NOTE — Progress Notes (Signed)
Paramedicine Encounter    Patient ID: Thomas West, male    DOB: 1960/08/29, 62 y.o.   MRN: [id number]   Complaints NONE  Assessment A&O x4, skin W&D w/ good color. Lung sounds w/ mild expiratory wheezes in rt upper lobe.  No edema noted.  Compliance with meds 100%  Pill box filled x 1 week  Refills needed Duloxetine  Meds changes since last visit NONE    Social changes NONE   BP 100/70 (BP Location: Left Arm, Patient Position: Sitting, Cuff Size: Normal)   Pulse 96   Resp 14   Wt 156 lb 14.4 oz (71.2 kg)   SpO2 94%   BMI 23.17 kg/m  Weight yesterday-not taken Last visit weight-155lb   ACTION: Home visit completed  [name] [phone] 09/22/22  Patient Care Team: [name], [name], MD as PCP - General (Family Medicine) Bensimhon, Bevelyn Buckles, MD as PCP - Cardiology (Cardiology) Glendale Chard, DO as Consulting Physician (Neurology) Sherrill Raring, [facility] (Pharmacist)  Patient Active Problem List   Diagnosis Date Noted  . 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 Pruritic rash 05/25/2022  . Polyneuropathy associated with underlying disease (HCC) 01/15/2022  . Atherosclerosis of aorta (HCC) 01/15/2022  . History of CVA (cerebrovascular accident) without residual deficits 12/24/2021  . AKI (acute kidney injury) (HCC) 12/13/2021  . Diarrhea 12/13/2021  . Shortness of breath 12/13/2021  . Chest pain 12/13/2021  . Nausea and vomiting 12/13/2021  . Heart failure (HCC) 12/08/2021  . Hypertension associated with diabetes (HCC) 07/12/2021  . Chronic kidney disease, stage 3a (HCC) 07/12/2021  . Hyperkalemia 07/12/2021  . CHF exacerbation (HCC) 04/20/2021  . Acute on chronic combined systolic and diastolic CHF (congestive heart failure) (HCC) 03/09/2021  . Acute on chronic HFrEF (heart failure with reduced ejection fraction) (HCC) 03/08/2021  . HFrEF (heart failure with reduced ejection fraction) (HCC) 02/03/2021  . Diabetes mellitus (HCC) 03/27/2019   . Hyperlipidemia associated with type 2 diabetes mellitus (HCC) 03/27/2019  . CAD (coronary artery disease) 03/05/2019  . History of MI (myocardial infarction) 03/05/2019  . Type 2 diabetes mellitus with diabetic neuropathy, unspecified (HCC) 03/05/2019  . HLD (hyperlipidemia) 03/05/2019  . GERD (gastroesophageal reflux disease) 03/05/2019  . Hypertension, essential, benign 03/05/2019    Current Outpatient Medications:  .  aspirin EC 81 MG tablet, Take 1 tablet (81 mg total) by mouth daily. Swallow whole., Disp: 30 tablet, Rfl: 12 .  atorvastatin (LIPITOR) 80 MG tablet, Take 1 tablet (80 mg total) by mouth daily., Disp: 90 tablet, Rfl: 1 .  carvedilol (COREG) 6.25 MG tablet, Take 1 tablet (6.25 mg total) by mouth EVERY 12 HOURS (10AM &10PM), Disp: 60 tablet, Rfl: 6 .  clopidogrel (PLAVIX) 75 MG tablet, Take 1 tablet (75 mg total) by mouth daily., Disp: 90 tablet, Rfl: 0 .  cyanocobalamin 1000 MCG tablet, TAKE ONE TABLET BY MOUTH DAILY, Disp: 30 tablet, Rfl: 1 .  DULoxetine (CYMBALTA) 30 MG capsule, Take 1 capsule (30 mg total) by mouth daily., Disp: 30 capsule, Rfl: 3 .  empagliflozin (JARDIANCE) 25 MG TABS tablet, Take 1 tablet (25 mg total) by mouth daily before breakfast., Disp: 90 tablet, Rfl: 1 .  hydrALAZINE (APRESOLINE) 50 MG tablet, Take 1.5 tablets (75 mg total) by mouth 3 (three) times daily., Disp: 135 tablet, Rfl: 11 .  hydrocortisone cream 1 %, Apply 1 Application topically 2 (two) times daily as needed for itching., Disp: 45 g, Rfl: 1 .  isosorbide mononitrate (IMDUR) 30 MG 24 hr  tablet, Take 1 tablet (30 mg total) by mouth daily., Disp: 30 tablet, Rfl: 6 .  sacubitril-valsartan (ENTRESTO) 24-26 MG, Take 1 tablet by mouth 2 (two) times daily., Disp: 60 tablet, Rfl: 3 .  spironolactone (ALDACTONE) 25 MG tablet, Take 1 tablet (25 mg total) by mouth daily., Disp: 30 tablet, Rfl: 11 .  torsemide (DEMADEX) 20 MG tablet, Take 1 tablet (20 mg total) by mouth daily., Disp: 30 tablet,  Rfl: 3 .  Continuous Glucose Receiver (DEXCOM G7 RECEIVER) DEVI, Use to check blood sugars (Patient not taking: Reported on 09/08/2022), Disp: 3 each, Rfl: 3 .  Continuous Glucose Sensor (DEXCOM G7 SENSOR) MISC, Use to check blood sugars. (Patient not taking: Reported on 09/08/2022), Disp: 3 each, Rfl: 2 .  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, Take 1 capsule by mouth daily., Disp: 30 capsule, Rfl: 11 Allergies  Allergen Reactions  . Bee Venom Anaphylaxis, Swelling and Other (See Comments)    Swells all over     Social History   Socioeconomic History  . Marital status: Single    Spouse name: Not on file  . Number of children: Not on file  . Years of education: Not on file  . Highest education level: Not on file  Occupational History  . Not on file  Tobacco Use  . Smoking status: Former    Types: Cigarettes    Quit date: 10/06/2017    Years since quitting: 4.9  . Smokeless tobacco: Never  Substance and Sexual Activity  . Alcohol use: Yes    Comment: Occasional Beer  . Drug use: Yes    Types: Marijuana  . Sexual activity: Not on file  Other Topics Concern  . Not on file  Social History Narrative   Right Handed.    Lives in a two story home    Lives with 46 year old son.    Social Determinants of Health   Financial Resource Strain: Low Risk  (01/18/2022)   Overall Financial Resource Strain (CARDIA)   . Difficulty of Paying Living Expenses: Not hard at all  Food Insecurity: No Food Insecurity (09/21/2022)   Hunger Vital Sign   . Worried About Programme researcher, broadcasting/film/video in the Last Year: Never true   . Ran Out of Food in the Last Year: Never true  Transportation Needs: No Transportation Needs (06/04/2022)   PRAPARE - Transportation   . Lack of Transportation (Medical): No   . Lack of Transportation (Non-Medical): No  Physical Activity: Inactive (01/18/2022)   Exercise Vital Sign   . Days of Exercise per Week: 0 days   . Minutes of Exercise per Session: 0 min  Stress: Stress Concern Present  (01/18/2022)   [employer] of Occupational Health - Occupational Stress Questionnaire   . Feeling of Stress : To some extent  Social Connections: Socially Isolated (01/18/2022)   Social Connection and Isolation Panel [NHANES]   . Frequency of Communication with Friends and Family: More than three times a week   . Frequency of Social Gatherings with Friends and Family: More than three times a week   . Attends Religious Services: Never   . Active Member of Clubs or Organizations: No   . Attends Banker Meetings: Never   . Marital Status: Never married  Intimate Partner Violence: Not At Risk (01/18/2022)   Humiliation, Afraid, Rape, and Kick questionnaire   . Fear of Current or Ex-Partner: No   . Emotionally Abused: No   . Physically Abused: No   .  Sexually Abused: No    Physical Exam      Future Appointments  Date Time Provider Department Center  09/24/2022 11:00 AM [name], [name], MD [facility]  10/20/2022 10:20 AM Bensimhon, Bevelyn Buckles, MD [facility] None  10/25/2022  1:30 PM Nita Sickle K, DO [facility] None

## 2022-09-22 NOTE — Progress Notes (Unsigned)
HPI: Thomas West is a 62 y.o. male, who is here today for chronic disease management.  Last seen on 05/25/22 No new problems since his last visit.  He mentions that his blood pressure has been reported as "good", with weekly monitoring by a visiting nurse.  Negative for unusual or severe headache, visual changes, exertional chest pain, dyspnea,  focal weakness, or edema. 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 He follows with cardiologist. He is not sure about medications he takes, states that his sister arranges his medications per week.  He is on Spironolactone 25 mg daily, Imdur 30 mg daily, hydralazine 50 mg 3 times daily,carvedilol 6.25 mg twice daily, Entresto 24-26 mg bid, and torsemide 20 mg daily. He does not see nephrologist. CKD III: Negative for gross hematuria,foam in urine,or decreased urine output.  Lab Results  Component Value Date   CREATININE 1.48 (H) 06/22/2022   BUN 16 06/22/2022   NA 137 06/22/2022   K 4.1 06/22/2022   CL 101 06/22/2022   CO2 26 06/22/2022   Diabetes Mellitus II: He is on Jardiance 25 mg daily. He has not been monitoring his blood sugar due to the absence of a Dexcom device, as he prefers not to use a glucometer that requires finger pricking. He changed insurance and believes that now it is covered. Negative for symptoms of hypoglycemia, polyuria, polydipsia, foot ulcers/trauma + Peripheral neuropathy. Initially affecting feet but spreading to distal aspect of LE's. He is inquiring about appt with neurologist. States that his cardiologist placed referral and he has not hear from them. Burning,stinging,and numbness sensation.  Lab Results  Component Value Date   HGBA1C 7.5 (A) 05/25/2022   Lab Results  Component Value Date   MICROALBUR 165.1 (H) 02/03/2021   HLD on Atorvastatin 80 mg daily. He believes he is taking Plavix 75 mg and Aspirin 81 mg daily. Hx of CVA.  Lab Results  Component Value Date   CHOL 138  03/13/2021   HDL 40 (L) 03/13/2021   LDLCALC 86 03/13/2021   TRIG 60 03/13/2021   CHOLHDL 3.5 03/13/2021   Review of Systems  Constitutional:  Negative for activity change, appetite change, chills and fever.  HENT:  Negative for mouth sores, nosebleeds and sore throat.   Respiratory:  Negative for cough and wheezing.   Gastrointestinal:  Negative for abdominal pain, nausea and vomiting.  Endocrine: Negative for cold intolerance and heat intolerance.  Skin:  Negative for rash.  Neurological:  Negative for syncope and facial asymmetry.  See other pertinent positives and negatives in HPI.  Current Outpatient Medications on File Prior to Visit  Medication Sig Dispense Refill   aspirin EC 81 MG tablet Take 1 tablet (81 mg total) by mouth daily. Swallow whole. 30 tablet 12   atorvastatin (LIPITOR) 80 MG tablet Take 1 tablet (80 mg total) by mouth daily. 90 tablet 1   carvedilol (COREG) 6.25 MG tablet Take 1 tablet (6.25 mg total) by mouth EVERY 12 HOURS (10AM &10PM) 60 tablet 6   clopidogrel (PLAVIX) 75 MG tablet Take 1 tablet (75 mg total) by mouth daily. 90 tablet 0   cyanocobalamin 1000 MCG tablet TAKE ONE TABLET BY MOUTH DAILY 30 tablet 1   DULoxetine (CYMBALTA) 30 MG capsule Take 1 capsule (30 mg total) by mouth daily. 30 capsule 3   DULoxetine (CYMBALTA) 30 MG capsule Take 1 capsule (30 mg total) by mouth daily. 30 capsule 0   empagliflozin (JARDIANCE) 25 MG TABS tablet  Take 1 tablet (25 mg total) by mouth daily before breakfast. 90 tablet 1   hydrALAZINE (APRESOLINE) 50 MG tablet Take 1.5 tablets (75 mg total) by mouth 3 (three) times daily. 135 tablet 11   hydrocortisone cream 1 % Apply 1 Application topically 2 (two) times daily as needed for itching. 45 g 1   isosorbide mononitrate (IMDUR) 30 MG 24 hr tablet Take 1 tablet (30 mg total) by mouth daily. 30 tablet 6   sacubitril-valsartan (ENTRESTO) 24-26 MG Take 1 tablet by mouth 2 (two) times daily. 60 tablet 3   spironolactone  (ALDACTONE) 25 MG tablet Take 1 tablet (25 mg total) by mouth daily. 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 30 capsule 11   torsemide (DEMADEX) 20 MG tablet Take 1 tablet (20 mg total) by mouth daily. 30 tablet 3   No current facility-administered medications on file prior to visit.   Past Medical History:  Diagnosis Date   CHF (congestive heart failure) (HCC)    Coronary artery disease    Diabetes mellitus without complication (HCC)    History of MI (myocardial infarction) 03/05/2019   Hypertension    Stroke (HCC)    Allergies  Allergen Reactions   Bee Venom Anaphylaxis, Swelling and Other (See Comments)    Swells all over   Social History   Socioeconomic History   Marital status: Single    Spouse name: Not on file   Number of children: Not on file   Years of education: Not on file   Highest education level: Not on file  Occupational History   Not on file  Tobacco Use   Smoking status: Former    Types: Cigarettes    Quit date: 10/06/2017    Years since quitting: 4.9   Smokeless tobacco: Never  Substance and Sexual Activity   Alcohol use: Yes    Comment: Occasional Beer   Drug use: Yes    Types: Marijuana   Sexual activity: Not on file  Other Topics Concern   Not on file  Social History Narrative   Right Handed.    Lives in a two story home    Lives with 75 year old son.    Social Determinants of Health   Financial Resource Strain: Low Risk  (01/18/2022)   Overall Financial Resource Strain (CARDIA)    Difficulty of Paying Living Expenses: Not hard at all  Food Insecurity: No Food Insecurity (09/21/2022)   Hunger Vital Sign    Worried About Running Out of Food in the Last Year: Never true    Ran Out of Food in the Last Year: Never true  Transportation Needs: No Transportation Needs (06/04/2022)   PRAPARE - Administrator, Civil Service (Medical): No    Lack of Transportation (Non-Medical): No  Physical Activity: Inactive  (01/18/2022)   Exercise Vital Sign    Days of Exercise per Week: 0 days    Minutes of Exercise per Session: 0 min  Stress: Stress Concern Present (01/18/2022)   [employer] of Occupational Health - Occupational Stress Questionnaire    Feeling of Stress : To some extent  Social Connections: Socially Isolated (01/18/2022)   Social Connection and Isolation Panel [NHANES]    Frequency of Communication with Friends and Family: More than three times a week    Frequency of Social Gatherings with Friends and Family: More than three times a week    Attends Religious Services: Never  Active Member of Clubs or Organizations: No    Attends Banker Meetings: Never    Marital Status: Never married    Vitals:   09/24/22 1042  BP: 138/80  Pulse: 88  Temp: 98.8 F (37.1 C)  SpO2: 98%   Body mass index is 22.89 kg/m.  Physical Exam Vitals and nursing note reviewed.  Constitutional:      General: He is not in acute distress.    Appearance: He is well-developed.  HENT:     Head: Normocephalic and atraumatic.     Mouth/Throat:     Mouth: Mucous membranes are moist.     Pharynx: Oropharynx is clear.  Eyes:     Conjunctiva/sclera: Conjunctivae normal.  Cardiovascular:     Rate and Rhythm: Normal rate and regular rhythm.     Heart sounds: No murmur heard.    Comments: DP pulses palpable. Pulmonary:     Effort: Pulmonary effort is normal. No respiratory distress.     Breath sounds: Normal breath sounds.  Abdominal:     Palpations: Abdomen is soft. There is no hepatomegaly or mass.     Tenderness: There is no abdominal tenderness.  Skin:    General: Skin is warm.     Findings: No erythema or rash.  Neurological:     Mental Status: He is alert and oriented to person, place, and time.     Cranial Nerves: No cranial nerve deficit.     Comments: Mildly unstable gait, not assisted.  Psychiatric:        Mood and Affect: Affect is blunt.   ASSESSMENT AND  PLAN:  Thomas West was seen today for medical management of chronic issues.  Diagnoses and all orders for this visit: Lab Results  Component Value Date   CHOL 153 09/24/2022   HDL 43.20 09/24/2022   LDLCALC 90 09/24/2022   TRIG 102.0 09/24/2022   CHOLHDL 4 09/24/2022   Lab Results  Component Value Date   CREATININE 1.85 (H) 09/24/2022   BUN 30 (H) 09/24/2022   NA 140 09/24/2022   K 4.6 09/24/2022   CL 103 09/24/2022   CO2 27 09/24/2022   Lab Results  Component Value Date   HGBA1C 8.1 (H) 09/24/2022   Lab Results  Component Value Date   MICROALBUR 17.5 (H) 09/24/2022   MICROALBUR 165.1 (H) 02/03/2021   Type 2 diabetes mellitus with diabetic neuropathy, without long-term current use of insulin (HCC) Assessment & Plan: Last HgA1C was 7.5 in 05/2022. Continue Jardiance 25 mg daily. Further recommendations according to HgA1C. Annual eye exam, periodic dental and foot care recommended. F/U in 4 months.  Orders: -     Dexcom G7 Receiver; Use to check blood sugars  Dispense: 3 each; Refill: 3 -     Dexcom G7 Sensor; Use to check blood sugars.  Dispense: 3 each; Refill: 2 -     Microalbumin / creatinine urine ratio; Future -     Hemoglobin A1c; Future -     Fructosamine; Future  Hypertension, essential, benign Assessment & Plan: Reporting "good" BP's at home, today mildly elevated, goal < 130/80. Continue carvedilol 6.25 mg twice daily, hydralazine 50 mg 3 times daily, Imdur 30 mg daily, spironolactone 25 mg daily, and Entresto same dose. Continue monitoring BP regularly. Low-salt diet  recommended. He is due for eye exam.  Orders: -     Basic metabolic panel; Future  Chronic kidney disease, stage 3a [facility]) Assessment & Plan: Problem has been stable. +Microalbuminuria. Continue  adequate hydration, BP and glucose controlled. Low salt diet recommended and continue avoidance of NSAID's.  Orders: -     Protein Electrophoresis,Random Urn  Colon cancer screening -      Ambulatory referral to Gastroenterology  Hyperlipidemia, unspecified hyperlipidemia type Assessment & Plan: Last LDL 86 in 02/2021. Continue Atorvastatin 80 mg daily and low fat diet. Further recommendations according to lipid panel result.  Orders: -     Lipid panel; Future  Polyneuropathy associated with underlying disease [facility]) Assessment & Plan: Evaluated by neurologist, Dr Allena Katz on 06/14/22, Dx'ed with amyloid neuropathy. It was recommended PT for balance training. Gabapentin has not helped. Duloxetine 30 mg started. He is not sure if he is taking medication. We discussed the importance of checking feet several times per week and appropriate skin care.   I spent a total of 41 minutes in both face to face and non face to face activities for this visit on the date of this encounter. During this time history was obtained and documented, examination was performed, prior labs reviewed, and assessment/plan discussed.  Return in about 4 months (around 01/25/2023) for chronic problems.  Kylah Maresh G. [name], MD  [facility]. [place] office.

## 2022-09-23 ENCOUNTER — Other Ambulatory Visit (HOSPITAL_COMMUNITY): Payer: Self-pay

## 2022-09-23 MED ORDER — DEXCOM G7 RECEIVER DEVI
3 refills | Status: DC
  Filled 2022-09-23: qty 1, 30d supply, fill #0

## 2022-09-23 MED ORDER — DULOXETINE HCL 30 MG PO CPEP: 30.0000 mg | ORAL_CAPSULE | Freq: Every day | ORAL | 0 refills | Status: DC

## 2022-09-23 MED ORDER — DEXCOM G7 SENSOR MISC
2 refills | Status: DC
  Filled 2022-09-23: qty 3, 30d supply, fill #0

## 2022-09-24 ENCOUNTER — Other Ambulatory Visit: Payer: Self-pay

## 2022-09-24 ENCOUNTER — Encounter: Payer: Self-pay | Admitting: Family Medicine

## 2022-09-24 ENCOUNTER — Ambulatory Visit (INDEPENDENT_AMBULATORY_CARE_PROVIDER_SITE_OTHER): Payer: No Typology Code available for payment source | Admitting: Family Medicine

## 2022-09-24 VITALS — BP 138/80 | HR 88 | Temp 98.8°F | Ht 69.0 in | Wt 155.0 lb

## 2022-09-24 DIAGNOSIS — E785 Hyperlipidemia, unspecified: Secondary | ICD-10-CM

## 2022-09-24 DIAGNOSIS — E1140 Type 2 diabetes mellitus with diabetic neuropathy, unspecified: Secondary | ICD-10-CM | POA: Diagnosis not present

## 2022-09-24 DIAGNOSIS — N1831 Chronic kidney disease, stage 3a: Secondary | ICD-10-CM | POA: Diagnosis not present

## 2022-09-24 DIAGNOSIS — I10 Essential (primary) hypertension: Secondary | ICD-10-CM | POA: Diagnosis not present

## 2022-09-24 DIAGNOSIS — G63 Polyneuropathy in diseases classified elsewhere: Secondary | ICD-10-CM | POA: Diagnosis not present

## 2022-09-24 DIAGNOSIS — Z1211 Encounter for screening for malignant neoplasm of colon: Secondary | ICD-10-CM

## 2022-09-24 DIAGNOSIS — Z7984 Long term (current) use of oral hypoglycemic drugs: Secondary | ICD-10-CM

## 2022-09-24 DIAGNOSIS — I5020 Unspecified systolic (congestive) heart failure: Secondary | ICD-10-CM

## 2022-09-24 LAB — BASIC METABOLIC PANEL
BUN: 30 mg/dL — ABNORMAL HIGH (ref 6–23)
CO2: 27 mEq/L (ref 19–32)
Calcium: 9.3 mg/dL (ref 8.4–10.5)
Chloride: 103 mEq/L (ref 96–112)
Creatinine, Ser: 1.85 mg/dL — ABNORMAL HIGH (ref 0.40–1.50)
GFR: 39.38 mL/min — ABNORMAL LOW (ref >60.00)
Glucose, Bld: 108 mg/dL — ABNORMAL HIGH (ref 70–99)
Potassium: 4.6 mEq/L (ref 3.5–5.1)
Sodium: 140 mEq/L (ref 135–145)

## 2022-09-24 LAB — LIPID PANEL
Cholesterol: 153 mg/dL (ref 0–200)
HDL: 43.2 mg/dL (ref >39.00)
LDL Cholesterol: 90 mg/dL (ref 0–99)
NonHDL: 110.15
Total CHOL/HDL Ratio: 4
Triglycerides: 102 mg/dL (ref 0.0–149.0)
VLDL: 20.4 mg/dL (ref 0.0–40.0)

## 2022-09-24 LAB — MICROALBUMIN / CREATININE URINE RATIO
Creatinine,U: 102.7 mg/dL
Microalb Creat Ratio: 17 mg/g (ref 0.0–30.0)
Microalb, Ur: 17.5 mg/dL — ABNORMAL HIGH (ref 0.0–1.9)

## 2022-09-24 LAB — HEMOGLOBIN A1C: Hgb A1c MFr Bld: 8.1 % — ABNORMAL HIGH (ref 4.6–6.5)

## 2022-09-24 MED ORDER — DEXCOM G7 SENSOR MISC
2 refills | Status: DC
Start: 2022-09-24 — End: 2023-02-02
  Filled 2022-09-24 – 2022-10-13 (×3): qty 3, 30d supply, fill #0

## 2022-09-24 MED ORDER — DEXCOM G7 RECEIVER DEVI
3 refills | Status: DC
Start: 2022-09-24 — End: 2023-02-02
  Filled 2022-09-24: qty 3, 30d supply, fill #0

## 2022-09-24 NOTE — Patient Instructions (Addendum)
A few things to remember from today's visit:  Type 2 diabetes mellitus with diabetic neuropathy, without long-term current use of insulin (HCC) - Plan: Continuous Glucose Receiver (DEXCOM G7 RECEIVER) DEVI, Continuous Glucose Sensor (DEXCOM G7 SENSOR) MISC, Microalbumin / creatinine urine ratio, Hemoglobin A1c, Fructosamine  Hypertension, essential, benign - Plan: Basic metabolic panel  Chronic kidney disease, stage 3a (HCC) - Plan: Protein Electrophoresis,Random Urn  Colon cancer screening - Plan: Ambulatory referral to Gastroenterology  Hyperlipidemia, unspecified hyperlipidemia type - Plan: Lipid panel  No changes today. It seems like your insurance may not pay for Dexcom. I still sent prescription as you requested. Appt with gastroenterologist will be arranged. Keep appt with neurologist.  If you need refills for medications you take chronically, please call your pharmacy. Do not use My Chart to request refills or for acute issues that need immediate attention. If you send a my chart message, it may take a few days to be addressed, specially if I am not in the office.  Please be sure medication list is accurate. If a new problem present, please set up appointment sooner than planned today.

## 2022-09-25 ENCOUNTER — Encounter: Payer: Self-pay | Admitting: Family Medicine

## 2022-09-25 NOTE — Assessment & Plan Note (Signed)
Problem has been stable. +Microalbuminuria. Continue adequate hydration, BP and glucose controlled. Low salt diet recommended and continue avoidance of NSAID's.

## 2022-09-25 NOTE — Assessment & Plan Note (Signed)
Reporting "good" BP's at home, today mildly elevated, goal < 130/80. Continue carvedilol 6.25 mg twice daily, hydralazine 50 mg 3 times daily, Imdur 30 mg daily, spironolactone 25 mg daily, and Entresto same dose. Continue monitoring BP regularly. Low-salt diet  recommended. He is due for eye exam.

## 2022-09-25 NOTE — Assessment & Plan Note (Signed)
Last HgA1C was 7.5 in 05/2022. Continue Jardiance 25 mg daily. Further recommendations according to HgA1C. Annual eye exam, periodic dental and foot care recommended. F/U in 4 months. 

## 2022-09-25 NOTE — Assessment & Plan Note (Deleted)
Last HgA1C was 7.5 in 05/2022. Continue Jardiance 25 mg daily. Further recommendations according to HgA1C. Annual eye exam, periodic dental and foot care recommended. F/U in 4 months.

## 2022-09-25 NOTE — Assessment & Plan Note (Signed)
Evaluated by neurologist, Dr Allena Katz on 06/14/22, Dx'ed with amyloid neuropathy. It was recommended PT for balance training. Gabapentin has not helped. Duloxetine 30 mg started. He is not sure if he is taking medication. We discussed the importance of checking feet several times per week and appropriate skin care.

## 2022-09-25 NOTE — Assessment & Plan Note (Signed)
Last LDL 86 in 02/2021. Continue Atorvastatin 80 mg daily and low fat diet. Further recommendations according to lipid panel result.

## 2022-09-27 ENCOUNTER — Other Ambulatory Visit: Payer: Self-pay

## 2022-09-27 ENCOUNTER — Other Ambulatory Visit (HOSPITAL_COMMUNITY): Payer: Self-pay

## 2022-09-27 ENCOUNTER — Ambulatory Visit: Payer: Medicare Other | Admitting: Dietician

## 2022-09-27 DIAGNOSIS — I2510 Atherosclerotic heart disease of native coronary artery without angina pectoris: Secondary | ICD-10-CM

## 2022-09-27 DIAGNOSIS — I5020 Unspecified systolic (congestive) heart failure: Secondary | ICD-10-CM

## 2022-09-27 DIAGNOSIS — I10 Essential (primary) hypertension: Secondary | ICD-10-CM

## 2022-09-27 MED ORDER — CARVEDILOL 6.25 MG PO TABS
6.2500 mg | ORAL_TABLET | Freq: Two times a day (BID) | ORAL | 6 refills | Status: DC
Start: 2022-09-27 — End: 2022-10-20

## 2022-09-27 MED ORDER — EZETIMIBE 10 MG PO TABS
10.0000 mg | ORAL_TABLET | Freq: Every day | ORAL | 3 refills | Status: DC
  Filled 2022-09-27: qty 30, 30d supply, fill #0
  Filled 2022-10-20: qty 30, 30d supply, fill #1

## 2022-09-27 MED ORDER — LINAGLIPTIN 5 MG PO TABS
5.0000 mg | ORAL_TABLET | Freq: Every day | ORAL | 3 refills | Status: DC
  Filled 2022-09-27: qty 30, 30d supply, fill #0
  Filled 2022-10-20: qty 30, 30d supply, fill #1

## 2022-09-29 ENCOUNTER — Other Ambulatory Visit (HOSPITAL_COMMUNITY): Payer: Self-pay

## 2022-09-29 ENCOUNTER — Other Ambulatory Visit (HOSPITAL_COMMUNITY): Payer: Self-pay | Admitting: Emergency Medicine

## 2022-09-29 LAB — PROTEIN ELECTROPHORESIS,RANDOM URN
Protein/Creat Ratio: 283 mg/g creat — ABNORMAL HIGH (ref 25–148)
Total Protein, Urine: 30 mg/dL — ABNORMAL HIGH (ref 5–25)

## 2022-09-29 NOTE — Progress Notes (Signed)
Paramedicine Encounter    Patient ID: Thomas West, male    DOB: Oct 08, 1960, 62 y.o.   MRN: [id number]   Complaints NONE  Assessment A&O x 4, skin W&D w/ good color.  No peripheral edema noted.  Compliance with meds 100%   Pill box filled x 1 week  Refills needed NONE  Meds changes since last visit  NONE   Social changes NONE   BP (!) 140/98 (BP Location: Left Arm, Patient Position: Sitting, Cuff Size: Normal)   Pulse 99   Resp 14   Wt 160 lb 6.4 oz (72.8 kg)   SpO2 96%   BMI 23.69 kg/m  Weight yesterday-not taken Last visit weight-156lb CBG 124  Thomas West was in great spirits on my arrival.  He has no complaints of chest pain or SOB.  He is doing well with med compliant but does not participate in filling his pill box.  Pill box reconciled x 1 week.  He has recently changed his insurance again to [facility] from [facility] because [facility] told him they would be able to cover his DexCom 7.  I called [facility] to inquire if they have been able to fill the script and they advised that [facility] would not cover it.  I requested Thomas West call [facility] and try and sort this issue out.  Mild inspiratory wheezing in the upper lobes bilaterally ACTION: Home visit completed  [name] [phone] 09/29/22  Patient Care Team: [name], [name], MD as PCP - General (Family Medicine) Bensimhon, Bevelyn Buckles, MD as PCP - Cardiology (Cardiology) Glendale Chard, DO as Consulting Physician (Neurology) Sherrill Raring, [facility] (Pharmacist)  Patient Active Problem List   Diagnosis Date Noted   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   Pruritic rash 05/25/2022   Polyneuropathy associated with underlying disease (HCC) 01/15/2022   Atherosclerosis of aorta (HCC) 01/15/2022   History of CVA (cerebrovascular accident) without residual deficits 12/24/2021   AKI (acute kidney injury) (HCC) 12/13/2021   Diarrhea 12/13/2021   Shortness of breath 12/13/2021   Chest pain 12/13/2021   Nausea and  vomiting 12/13/2021   Heart failure (HCC) 12/08/2021   Hypertension associated with diabetes (HCC) 07/12/2021   Chronic kidney disease, stage 3a (HCC) 07/12/2021   Hyperkalemia 07/12/2021   CHF exacerbation (HCC) 04/20/2021   HFrEF (heart failure with reduced ejection fraction) (HCC) 02/03/2021   Diabetes mellitus (HCC) 03/27/2019   Hyperlipidemia associated with type 2 diabetes mellitus (HCC) 03/27/2019   CAD (coronary artery disease) 03/05/2019   History of MI (myocardial infarction) 03/05/2019   Type 2 diabetes mellitus with diabetic neuropathy, unspecified (HCC) 03/05/2019   HLD (hyperlipidemia) 03/05/2019   GERD (gastroesophageal reflux disease) 03/05/2019   Hypertension, essential, benign 03/05/2019    Current Outpatient Medications:    aspirin EC 81 MG tablet, Take 1 tablet (81 mg total) by mouth daily. Swallow whole., Disp: 30 tablet, Rfl: 12   atorvastatin (LIPITOR) 80 MG tablet, Take 1 tablet (80 mg total) by mouth daily., Disp: 90 tablet, Rfl: 1   carvedilol (COREG) 6.25 MG tablet, Take 1 tablet (6.25 mg total) by mouth EVERY 12 HOURS (10AM &10PM), Disp: 60 tablet, Rfl: 6   clopidogrel (PLAVIX) 75 MG tablet, Take 1 tablet (75 mg total) by mouth daily., Disp: 90 tablet, Rfl: 0   cyanocobalamin 1000 MCG tablet, TAKE ONE TABLET BY MOUTH DAILY, Disp: 30 tablet, Rfl: 1   DULoxetine (CYMBALTA) 30 MG capsule, Take 1 capsule (30 mg total) by mouth daily., Disp: 30  capsule, Rfl: 3   empagliflozin (JARDIANCE) 25 MG TABS tablet, Take 1 tablet (25 mg total) by mouth daily before breakfast., Disp: 90 tablet, Rfl: 1   ezetimibe (ZETIA) 10 MG tablet, Take 1 tablet (10 mg total) by mouth daily., Disp: 30 tablet, Rfl: 3   hydrALAZINE (APRESOLINE) 50 MG tablet, Take 1.5 tablets (75 mg total) by mouth 3 (three) times daily., Disp: 135 tablet, Rfl: 11   hydrocortisone cream 1 %, Apply 1 Application topically 2 (two) times daily as needed for itching., Disp: 45 g, Rfl: 1   isosorbide mononitrate  (IMDUR) 30 MG 24 hr tablet, Take 1 tablet (30 mg total) by mouth daily., Disp: 30 tablet, Rfl: 6   linagliptin (TRADJENTA) 5 MG TABS tablet, Take 1 tablet (5 mg total) by mouth daily., Disp: 30 tablet, Rfl: 3   sacubitril-valsartan (ENTRESTO) 24-26 MG, Take 1 tablet by mouth 2 (two) times daily., Disp: 60 tablet, Rfl: 3   spironolactone (ALDACTONE) 25 MG tablet, Take 1 tablet (25 mg total) by mouth daily., Disp: 30 tablet, Rfl: 11   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, Take 1 capsule by mouth daily., Disp: 30 capsule, Rfl: 11   torsemide (DEMADEX) 20 MG tablet, Take 1 tablet (20 mg total) by mouth daily., Disp: 30 tablet, Rfl: 3   Continuous Glucose Receiver (DEXCOM G7 RECEIVER) DEVI, Use to check blood sugars (Patient not taking: Reported on 09/29/2022), Disp: 3 each, Rfl: 3   Continuous Glucose Sensor (DEXCOM G7 SENSOR) MISC, Use to check blood sugars. (Patient not taking: Reported on 09/29/2022), Disp: 3 each, Rfl: 2   DULoxetine (CYMBALTA) 30 MG capsule, Take 1 capsule (30 mg total) by mouth daily., Disp: 30 capsule, Rfl: 0 Allergies  Allergen Reactions   Bee Venom Anaphylaxis, Swelling and Other (See Comments)    Swells all over     Social History   Socioeconomic History   Marital status: Single    Spouse name: Not on file   Number of children: Not on file   Years of education: Not on file   Highest education level: Not on file  Occupational History   Not on file  Tobacco Use   Smoking status: Former    Types: Cigarettes    Quit date: 10/06/2017    Years since quitting: 4.9   Smokeless tobacco: Never  Substance and Sexual Activity   Alcohol use: Yes    Comment: Occasional Beer   Drug use: Yes    Types: Marijuana   Sexual activity: Not on file  Other Topics Concern   Not on file  Social History Narrative   Right Handed.    Lives in a two story home    Lives with 67 year old son.    Social Determinants of Health   Financial Resource Strain: Low Risk  (01/18/2022)   Overall Financial  Resource Strain (CARDIA)    Difficulty of Paying Living Expenses: Not hard at all  Food Insecurity: No Food Insecurity (09/21/2022)   Hunger Vital Sign    Worried About Running Out of Food in the Last Year: Never true    Ran Out of Food in the Last Year: Never true  Transportation Needs: No Transportation Needs (06/04/2022)   PRAPARE - Administrator, Civil Service (Medical): No    Lack of Transportation (Non-Medical): No  Physical Activity: Inactive (01/18/2022)   Exercise Vital Sign    Days of Exercise per Week: 0 days    Minutes of Exercise per Session: 0 min  Stress: Stress Concern Present (01/18/2022)   [employer] of Occupational Health - Occupational Stress Questionnaire    Feeling of Stress : To some extent  Social Connections: Socially Isolated (01/18/2022)   Social Connection and Isolation Panel [NHANES]    Frequency of Communication with Friends and Family: More than three times a week    Frequency of Social Gatherings with Friends and Family: More than three times a week    Attends Religious Services: Never    Database administrator or Organizations: No    Attends Banker Meetings: Never    Marital Status: Never married  Intimate Partner Violence: Not At Risk (01/18/2022)   Humiliation, Afraid, Rape, and Kick questionnaire    Fear of Current or Ex-Partner: No    Emotionally Abused: No    Physically Abused: No    Sexually Abused: No    Physical Exam      Future Appointments  Date Time Provider Department Center  10/20/2022 10:20 AM Bensimhon, Bevelyn Buckles, MD [facility] None  10/25/2022  1:30 PM Nita Sickle K, DO [facility] None

## 2022-09-30 LAB — PROTEIN ELECTROPHORESIS,RANDOM URN
Albumin: 79 %
Alpha-1-Globulin, U: 3 %
Alpha-2-Globulin, U: 4 %
Beta Globulin, U: 7 %
Creatinine, Urine: 106 mg/dL (ref 20–320)
Gamma Globulin, U: 7 %
Protein/Creatinine Ratio: 0.283 mg/mg creat — ABNORMAL HIGH (ref 0.025–0.148)

## 2022-09-30 LAB — FRUCTOSAMINE: Fructosamine: 329 umol/L — ABNORMAL HIGH (ref 205–285)

## 2022-10-06 ENCOUNTER — Other Ambulatory Visit (HOSPITAL_COMMUNITY): Payer: Self-pay | Admitting: Emergency Medicine

## 2022-10-06 NOTE — Progress Notes (Signed)
Paramedicine Encounter    Patient ID: Thomas West, male    DOB: 07-03-1960, 62 y.o.   MRN: [id number]   Complaints NONE  Assessment A&O x 4, skin W&D w/ good color.  Lung sounds w/ expiratory wheezes and ronchi in upper lobes bilat.   No peripheral edema noted.  Compliance with meds 100%  Pill box filled x 1 week  Refills needed Waiting on DexCom 7  Meds changes since last visit NONE    Social changes NONE   BP 120/80 (BP Location: Left Arm, Patient Position: Sitting, Cuff Size: Normal)   Pulse 80   Resp 14   Wt 157 lb 6.4 oz (71.4 kg)   SpO2 96%   BMI 23.24 kg/m  Weight yesterday-not taken Last visit weight-160lb  ACTION: Home visit completed  [name] [phone] 10/06/22  Patient Care Team: [name], [name], MD as PCP - General (Family Medicine) Bensimhon, Bevelyn Buckles, MD as PCP - Cardiology (Cardiology) Glendale Chard, DO as Consulting Physician (Neurology) Sherrill Raring, [facility] (Pharmacist)  Patient Active Problem List   Diagnosis Date Noted  . 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 Pruritic rash 05/25/2022  . Polyneuropathy associated with underlying disease (HCC) 01/15/2022  . Atherosclerosis of aorta (HCC) 01/15/2022  . History of CVA (cerebrovascular accident) without residual deficits 12/24/2021  . AKI (acute kidney injury) (HCC) 12/13/2021  . Diarrhea 12/13/2021  . Shortness of breath 12/13/2021  . Chest pain 12/13/2021  . Nausea and vomiting 12/13/2021  . Heart failure (HCC) 12/08/2021  . Hypertension associated with diabetes (HCC) 07/12/2021  . Chronic kidney disease, stage 3a (HCC) 07/12/2021  . Hyperkalemia 07/12/2021  . CHF exacerbation (HCC) 04/20/2021  . HFrEF (heart failure with reduced ejection fraction) (HCC) 02/03/2021  . Diabetes mellitus (HCC) 03/27/2019  . Hyperlipidemia associated with type 2 diabetes mellitus (HCC) 03/27/2019  . CAD (coronary artery disease) 03/05/2019  . History of MI (myocardial  infarction) 03/05/2019  . Type 2 diabetes mellitus with diabetic neuropathy, unspecified (HCC) 03/05/2019  . HLD (hyperlipidemia) 03/05/2019  . GERD (gastroesophageal reflux disease) 03/05/2019  . Hypertension, essential, benign 03/05/2019    Current Outpatient Medications:  .  aspirin EC 81 MG tablet, Take 1 tablet (81 mg total) by mouth daily. Swallow whole., Disp: 30 tablet, Rfl: 12 .  atorvastatin (LIPITOR) 80 MG tablet, Take 1 tablet (80 mg total) by mouth daily., Disp: 90 tablet, Rfl: 1 .  carvedilol (COREG) 6.25 MG tablet, Take 1 tablet (6.25 mg total) by mouth EVERY 12 HOURS (10AM &10PM), Disp: 60 tablet, Rfl: 6 .  clopidogrel (PLAVIX) 75 MG tablet, Take 1 tablet (75 mg total) by mouth daily., Disp: 90 tablet, Rfl: 0 .  cyanocobalamin 1000 MCG tablet, TAKE ONE TABLET BY MOUTH DAILY, Disp: 30 tablet, Rfl: 1 .  DULoxetine (CYMBALTA) 30 MG capsule, Take 1 capsule (30 mg total) by mouth daily., Disp: 30 capsule, Rfl: 3 .  empagliflozin (JARDIANCE) 25 MG TABS tablet, Take 1 tablet (25 mg total) by mouth daily before breakfast., Disp: 90 tablet, Rfl: 1 .  ezetimibe (ZETIA) 10 MG tablet, Take 1 tablet (10 mg total) by mouth daily., Disp: 30 tablet, Rfl: 3 .  hydrALAZINE (APRESOLINE) 50 MG tablet, Take 1.5 tablets (75 mg total) by mouth 3 (three) times daily., Disp: 135 tablet, Rfl: 11 .  hydrocortisone cream 1 %, Apply 1 Application topically 2 (two) times daily as needed for itching., Disp: 45 g, Rfl: 1 .  isosorbide mononitrate (IMDUR) 30 MG 24 hr tablet,  Take 1 tablet (30 mg total) by mouth daily., Disp: 30 tablet, Rfl: 6 .  linagliptin (TRADJENTA) 5 MG TABS tablet, Take 1 tablet (5 mg total) by mouth daily., Disp: 30 tablet, Rfl: 3 .  sacubitril-valsartan (ENTRESTO) 24-26 MG, Take 1 tablet by mouth 2 (two) times daily., Disp: 60 tablet, Rfl: 3 .  spironolactone (ALDACTONE) 25 MG tablet, Take 1 tablet (25 mg total) by mouth daily., Disp: 30 tablet, Rfl: 11 .  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, Take 1  capsule by mouth daily., Disp: 30 capsule, Rfl: 11 .  torsemide (DEMADEX) 20 MG tablet, Take 1 tablet (20 mg total) by mouth daily., Disp: 30 tablet, Rfl: 3 .  Continuous Glucose Receiver (DEXCOM G7 RECEIVER) DEVI, Use to check blood sugars (Patient not taking: Reported on 09/29/2022), Disp: 3 each, Rfl: 3 .  Continuous Glucose Sensor (DEXCOM G7 SENSOR) MISC, Use to check blood sugars as directed. Change every 10 days. (Patient not taking: Reported on 10/06/2022), Disp: 3 each, Rfl: 2 .  DULoxetine (CYMBALTA) 30 MG capsule, Take 1 capsule (30 mg total) by mouth daily. (Patient not taking: Reported on 10/06/2022), Disp: 30 capsule, Rfl: 0 Allergies  Allergen Reactions  . Bee Venom Anaphylaxis, Swelling and Other (See Comments)    Swells all over     Social History   Socioeconomic History  . Marital status: Single    Spouse name: Not on file  . Number of children: Not on file  . Years of education: Not on file  . Highest education level: Not on file  Occupational History  . Not on file  Tobacco Use  . Smoking status: Former    Types: Cigarettes    Quit date: 10/06/2017    Years since quitting: 5.0  . Smokeless tobacco: Never  Substance and Sexual Activity  . Alcohol use: Yes    Comment: Occasional Beer  . Drug use: Yes    Types: Marijuana  . Sexual activity: Not on file  Other Topics Concern  . Not on file  Social History Narrative   Right Handed.    Lives in a two story home    Lives with 50 year old son.    Social Determinants of Health   Financial Resource Strain: Low Risk  (01/18/2022)   Overall Financial Resource Strain (CARDIA)   . Difficulty of Paying Living Expenses: Not hard at all  Food Insecurity: No Food Insecurity (09/21/2022)   Hunger Vital Sign   . Worried About Programme researcher, broadcasting/film/video in the Last Year: Never true   . Ran Out of Food in the Last Year: Never true  Transportation Needs: No Transportation Needs (06/04/2022)   PRAPARE - Transportation   . Lack of  Transportation (Medical): No   . Lack of Transportation (Non-Medical): No  Physical Activity: Inactive (01/18/2022)   Exercise Vital Sign   . Days of Exercise per Week: 0 days   . Minutes of Exercise per Session: 0 min  Stress: Stress Concern Present (01/18/2022)   [employer] of Occupational Health - Occupational Stress Questionnaire   . Feeling of Stress : To some extent  Social Connections: Socially Isolated (01/18/2022)   Social Connection and Isolation Panel [NHANES]   . Frequency of Communication with Friends and Family: More than three times a week   . Frequency of Social Gatherings with Friends and Family: More than three times a week   . Attends Religious Services: Never   . Active Member of Clubs or Organizations: No   .  Attends Banker Meetings: Never   . Marital Status: Never married  Intimate Partner Violence: Not At Risk (01/18/2022)   Humiliation, Afraid, Rape, and Kick questionnaire   . Fear of Current or Ex-Partner: No   . Emotionally Abused: No   . Physically Abused: No   . Sexually Abused: No    Physical Exam      Future Appointments  Date Time Provider Department Center  10/20/2022 10:20 AM Bensimhon, Bevelyn Buckles, MD [facility] None  10/25/2022  1:30 PM Nita Sickle K, DO [facility] None

## 2022-10-08 ENCOUNTER — Encounter: Payer: Self-pay | Admitting: *Deleted

## 2022-10-08 DIAGNOSIS — Z006 Encounter for examination for normal comparison and control in clinical research program: Secondary | ICD-10-CM

## 2022-10-08 NOTE — Research (Signed)
Visit 2 end of study visit for ANALOG study. Patient was in Control Group. States he is doing well. Thanked patient for participating in the study.       [place] Armentha Branagan, Research Coordinator 10/08/2022  11:58 am

## 2022-10-11 ENCOUNTER — Other Ambulatory Visit: Payer: Self-pay | Admitting: *Deleted

## 2022-10-11 ENCOUNTER — Other Ambulatory Visit (HOSPITAL_COMMUNITY): Payer: Self-pay

## 2022-10-11 DIAGNOSIS — I2510 Atherosclerotic heart disease of native coronary artery without angina pectoris: Secondary | ICD-10-CM

## 2022-10-11 MED ORDER — ATORVASTATIN CALCIUM 80 MG PO TABS: 80.0000 mg | ORAL_TABLET | Freq: Every day | ORAL | 3 refills | Status: DC

## 2022-10-13 ENCOUNTER — Other Ambulatory Visit (HOSPITAL_COMMUNITY): Payer: Self-pay

## 2022-10-13 ENCOUNTER — Other Ambulatory Visit (HOSPITAL_COMMUNITY): Payer: Self-pay | Admitting: Emergency Medicine

## 2022-10-13 NOTE — Progress Notes (Signed)
Paramedicine Encounter    Patient ID: Thomas West, male    DOB: [birth year], 62 y.o.   MRN: [id number]   Complaints NONE  Assessment A&O x 4, skin W&D w/ good color.  No edema noted.  Lung sounds clear and equal bilat.  Compliance with meds 100%  Pill box filled x 1 week  Refills needed Torsemide, Cleda Daub, Trajenta, Clopidogrel, Isosorbide, Carvedilol, Zetia, Entresto  Meds changes since last visit NONE    Social changes NONE   BP 110/80 (BP Location: Left Arm, Patient Position: Sitting, Cuff Size: Normal)   Pulse 78   Resp 16   Wt 156 lb 6.4 oz (70.9 kg)   SpO2 96%   BMI 23.10 kg/m  Weight yesterday-not taken Last visit weight-157lb  Thomas West reports to be doing well today and has no complaints.  While calling into [employer] for refills discovered pt has no insurance or prescription coverage w/ [employer].  Called [employer] to follow up on this.  They advised that Thomas West is now covered w/ a company called [employer].  Pt states he did not authorize this change.  Ultimately told that Thomas West cannot change back to [facility] until 12/23/22 in the meantime I advised him to call [employer] @ [phone] and find his   Insurance ID#  etc. In order to get his prescriptions covered now.    ACTION: Home visit completed  [name] [phone] 10/13/22  Patient Care Team: [name], [name], MD as PCP - General (Family Medicine) Bensimhon, Bevelyn Buckles, MD as PCP - Cardiology (Cardiology) Glendale Chard, DO as Consulting Physician (Neurology) Sherrill Raring, [facility] (Pharmacist)  Patient Active Problem List   Diagnosis Date Noted   Cardiac amyloidosis [facility]) 05/25/2022   Pruritic rash 05/25/2022   Polyneuropathy associated with underlying disease (HCC) 01/15/2022   Atherosclerosis of aorta (HCC) 01/15/2022   History of CVA (cerebrovascular accident) without residual deficits 12/24/2021   AKI (acute kidney injury) (HCC) 12/13/2021   Diarrhea 12/13/2021   Shortness of breath  12/13/2021   Chest pain 12/13/2021   Nausea and vomiting 12/13/2021   Heart failure (HCC) 12/08/2021   Hypertension associated with diabetes (HCC) 07/12/2021   Chronic kidney disease, stage 3a (HCC) 07/12/2021   Hyperkalemia 07/12/2021   CHF exacerbation (HCC) 04/20/2021   HFrEF (heart failure with reduced ejection fraction) (HCC) 02/03/2021   Diabetes mellitus (HCC) 03/27/2019   Hyperlipidemia associated with type 2 diabetes mellitus (HCC) 03/27/2019   CAD (coronary artery disease) 03/05/2019   History of MI (myocardial infarction) 03/05/2019   Type 2 diabetes mellitus with diabetic neuropathy, unspecified (HCC) 03/05/2019   HLD (hyperlipidemia) 03/05/2019   GERD (gastroesophageal reflux disease) 03/05/2019   Hypertension, essential, benign 03/05/2019    Current Outpatient Medications:    aspirin EC 81 MG tablet, Take 1 tablet (81 mg total) by mouth daily. Swallow whole., Disp: 30 tablet, Rfl: 12   atorvastatin (LIPITOR) 80 MG tablet, Take 1 tablet (80 mg total) by mouth daily., Disp: 90 tablet, Rfl: 3   carvedilol (COREG) 6.25 MG tablet, Take 1 tablet (6.25 mg total) by mouth EVERY 12 HOURS (10AM &10PM), Disp: 60 tablet, Rfl: 6   clopidogrel (PLAVIX) 75 MG tablet, Take 1 tablet (75 mg total) by mouth daily., Disp: 90 tablet, Rfl: 0   cyanocobalamin 1000 MCG tablet, TAKE ONE TABLET BY MOUTH DAILY, Disp: 30 tablet, Rfl: 1   DULoxetine (CYMBALTA) 30 MG capsule, Take 1 capsule (30 mg total) by mouth daily., Disp: 30 capsule, Rfl:  3   empagliflozin (JARDIANCE) 25 MG TABS tablet, Take 1 tablet (25 mg total) by mouth daily before breakfast., Disp: 90 tablet, Rfl: 1   ezetimibe (ZETIA) 10 MG tablet, Take 1 tablet (10 mg total) by mouth daily., Disp: 30 tablet, Rfl: 3   hydrALAZINE (APRESOLINE) 50 MG tablet, Take 1.5 tablets (75 mg total) by mouth 3 (three) times daily., Disp: 135 tablet, Rfl: 11   hydrocortisone cream 1 %, Apply 1 Application topically 2 (two) times daily as needed for  itching., Disp: 45 g, Rfl: 1   isosorbide mononitrate (IMDUR) 30 MG 24 hr tablet, Take 1 tablet (30 mg total) by mouth daily., Disp: 30 tablet, Rfl: 6   linagliptin (TRADJENTA) 5 MG TABS tablet, Take 1 tablet (5 mg total) by mouth daily., Disp: 30 tablet, Rfl: 3   sacubitril-valsartan (ENTRESTO) 24-26 MG, Take 1 tablet by mouth 2 (two) times daily., Disp: 60 tablet, Rfl: 3   spironolactone (ALDACTONE) 25 MG tablet, Take 1 tablet (25 mg total) by mouth daily., Disp: 30 tablet, Rfl: 11   torsemide (DEMADEX) 20 MG tablet, Take 1 tablet (20 mg total) by mouth daily., Disp: 30 tablet, Rfl: 3   Continuous Glucose Receiver (DEXCOM G7 RECEIVER) DEVI, Use to check blood sugars (Patient not taking: Reported on 09/29/2022), Disp: 3 each, Rfl: 3   Continuous Glucose Sensor (DEXCOM G7 SENSOR) MISC, Use to check blood sugars as directed. Change every 10 days. (Patient not taking: Reported on 10/06/2022), Disp: 3 each, Rfl: 2   DULoxetine (CYMBALTA) 30 MG capsule, Take 1 capsule (30 mg total) by mouth daily. (Patient not taking: Reported on 10/06/2022), Disp: 30 capsule, Rfl: 0   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, Take 1 capsule by mouth daily., Disp: 30 capsule, Rfl: 11 Allergies  Allergen Reactions   Bee Venom Anaphylaxis, Swelling and Other (See Comments)    Swells all over     Social History   Socioeconomic History   Marital status: Single    Spouse name: Not on file   Number of children: Not on file   Years of education: Not on file   Highest education level: Not on file  Occupational History   Not on file  Tobacco Use   Smoking status: Former    Types: Cigarettes    Quit date: 10/06/2017    Years since quitting: 5.0   Smokeless tobacco: Never  Substance and Sexual Activity   Alcohol use: Yes    Comment: Occasional Beer   Drug use: Yes    Types: Marijuana   Sexual activity: Not on file  Other Topics Concern   Not on file  Social History Narrative   Right Handed.    Lives in a two story home    Lives  with 53 year old son.    Social Determinants of Health   Financial Resource Strain: Low Risk  (01/18/2022)   Overall Financial Resource Strain (CARDIA)    Difficulty of Paying Living Expenses: Not hard at all  Food Insecurity: No Food Insecurity (09/21/2022)   Hunger Vital Sign    Worried About Running Out of Food in the Last Year: Never true    Ran Out of Food in the Last Year: Never true  Transportation Needs: No Transportation Needs (06/04/2022)   PRAPARE - Administrator, Civil Service (Medical): No    Lack of Transportation (Non-Medical): No  Physical Activity: Inactive (01/18/2022)   Exercise Vital Sign    Days of Exercise per Week: 0 days  Minutes of Exercise per Session: 0 min  Stress: Stress Concern Present (01/18/2022)   [employer] of Occupational Health - Occupational Stress Questionnaire    Feeling of Stress : To some extent  Social Connections: Socially Isolated (01/18/2022)   Social Connection and Isolation Panel [NHANES]    Frequency of Communication with Friends and Family: More than three times a week    Frequency of Social Gatherings with Friends and Family: More than three times a week    Attends Religious Services: Never    Database administrator or Organizations: No    Attends Banker Meetings: Never    Marital Status: Never married  Intimate Partner Violence: Not At Risk (01/18/2022)   Humiliation, Afraid, Rape, and Kick questionnaire    Fear of Current or Ex-Partner: No    Emotionally Abused: No    Physically Abused: No    Sexually Abused: No    Physical Exam      Future Appointments  Date Time Provider Department Center  10/20/2022 10:20 AM Bensimhon, Bevelyn Buckles, MD [facility] None  10/25/2022  1:30 PM Nita Sickle K, DO [facility] None

## 2022-10-14 ENCOUNTER — Other Ambulatory Visit (HOSPITAL_COMMUNITY): Payer: Self-pay

## 2022-10-15 ENCOUNTER — Other Ambulatory Visit (HOSPITAL_COMMUNITY): Payer: Self-pay

## 2022-10-18 NOTE — Progress Notes (Signed)
Advanced Heart Failure Clinic Note   Primary Care: Dr. Betty [name] HF Cardiologist: Dr. Gala Romney  HPI: Thomas West is a 62 y.o. male with history of CAD with prior MI and stent in 01/2019 at [facility] in [place] (report not available), cardiomyopathy/chronic systolic HF, uncontrolled DM II, HTN, hx CVA on chart review, CKD.    Moved to [place] from [place], [place] 2 years ago.   Had not medical f/u until he was admitted in 10/22 with a/c systolic HF. Had been out of cardiac medications. Diuresed with IV lasix then transitioned to po lasix 40 mg daily. Started on GDMT with carvedilol, hydralazine, imdur and empagliflozin.    Echo 10/22 with EF 20-25%, RV okay, trivial MR   [facility] 10/22 with nonobstructive CAD and patent RPLV stent. RA mean 5 mmHg, PCWP mean 5 mmHg, Fick CO 7.46/CI 3.85.   He did not show up for [facility] appointment after discharge.   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  Admitted 12/22 with ADHF due to noncompliance with GDMT. cMRI strongly suggestive of amyloidosis, GDMT titrated.   Admitted 2/23 with a/c CHF after running out of meds x 1 week. Given IV lasix, GDMT restarted.   Admitted 7/23 post fall w/ a/c CHF. Diuresed well with IV lasix/metolazone and discharged. He developed an AKI Cr >> 1.2>>1.75, Entresto and spiro were held and he was instructed to restart the next day following discharge.    Readmitted 12/12/21 w/ 2 days of persistent N/V/D with intermittent CP and SOB. Presented with AKI cr 3.3 2/2 emesis/diarrhea. Responded well to 500 mL NS bolus and holding of Entresto and spiro. Restarted meds once discharged.   Admitted 8/23 with ADHF. Diuresed with IV lasix. Hospitalization c/b with AKI, GDMT initially held; Entresto resumed at low dose, torsemide on MWF. Discharged home, weight 145 lbs.  Seen in ED 06/01/22 with CP and SOB. EKG  without acute changes, HsTroponins negative x 3. CT chest showed evidence of bronchitis, no PE or PNA. CT abd/pelvis showed left inguinal hernia and GS recommended outpatient follow up. He was treated with abx over concern for early PNA and discharged home.  Today he returns for post ED evaluation HF follow up with [name] with paramedicine.Overall feeling fine. Has SOB walking up steps. Back is painful after previous fall, feels swelling.  Neuropathy keeping him up at night. Off gabapentin and trying Cymbalta. Denies palpitations CP, dizziness, or PND/Orthopnea. Appetite ok. No fever or chills. Weight at home 157 pounds. Taking all medications. Saw Neurology last week and will start PT for balance training.  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   - cMRI (11/22): LVEF 24% RVEF 21% LGE and ECW suggestive of cardiac amyloidosis  ROS: All systems reviewed and negative except as per HPI.   Past Medical History:  Diagnosis Date   CHF (congestive heart failure) (HCC)    Coronary artery disease    Diabetes mellitus without complication (HCC)    History of MI (myocardial infarction) 03/05/2019   Hypertension    Stroke [facility])    Current Outpatient Medications  Medication Sig Dispense Refill   aspirin EC 81 MG tablet Take 1 tablet (81 mg total) by mouth daily. Swallow whole. 30 tablet  12   atorvastatin (LIPITOR) 80 MG tablet Take 1 tablet (80 mg total) by mouth daily. 30 tablet 6   carvedilol (COREG) 6.25 MG tablet Take 1 tablet (6.25 mg total) by mouth EVERY 12 HOURS (10AM &10PM) 60 tablet 6   clopidogrel (PLAVIX) 75 MG tablet Take 1 tablet (75 mg total) by mouth daily. 30 tablet 3   Continuous Blood Gluc Receiver (DEXCOM G7 RECEIVER) DEVI Use to check blood sugars 3 each 3   cyanocobalamin (VITAMIN B12) 1000 MCG tablet Take 1,000 mcg by  mouth daily.     cyanocobalamin 1000 MCG tablet TAKE ONE TABLET BY MOUTH DAILY 30 tablet 2   empagliflozin (JARDIANCE) 25 MG TABS tablet Take 1 tablet (25 mg total) by mouth daily before breakfast. 90 tablet 1   hydrALAZINE (APRESOLINE) 50 MG tablet Take 1 tablet (50 mg total) by mouth 3 (three) times daily. 90 tablet 6   hydrocortisone cream 1 % Apply 1 Application topically 2 (two) times daily as needed for itching. 45 g 1   isosorbide mononitrate (IMDUR) 30 MG 24 hr tablet Take 1 tablet (30 mg total) by mouth daily. 30 tablet 6   sacubitril-valsartan (ENTRESTO) 24-26 MG Take 1 tablet by mouth 2 (two) times daily. 60 tablet 3   spironolactone (ALDACTONE) 25 MG tablet Take 1 tablet (25 mg total) by mouth daily. 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 30 capsule 11   torsemide (DEMADEX) 20 MG tablet Take 1 tablet (20 mg total) by mouth every Monday, Wednesday, and Friday. 30 tablet 3   DULoxetine (CYMBALTA) 30 MG capsule Take 1 capsule (30 mg total) by mouth daily. (Patient not taking: Reported on 06/22/2022) 30 capsule 3   ondansetron (ZOFRAN) 4 MG tablet Take 1 tablet (4 mg total) by mouth every 8 (eight) hours as needed for nausea or vomiting. (Patient not taking: Reported on 06/22/2022) 12 tablet 0   No current facility-administered medications for this encounter.   Allergies  Allergen Reactions   Bee Venom Anaphylaxis, Swelling and Other (See Comments)    Swells all over   Social History   Socioeconomic History   Marital status: Single    Spouse name: Not on file   Number of children: Not on file   Years of education: Not on file   Highest education level: Not on file  Occupational History   Not on file  Tobacco Use   Smoking status: Former    Types: Cigarettes    Quit date: 10/06/2017    Years since quitting: 4.7   Smokeless tobacco: Never  Substance and Sexual Activity   Alcohol use: Yes    Comment: Occasional Beer   Drug use: Yes    Types:  Marijuana   Sexual activity: Not on file  Other Topics Concern   Not on file  Social History Narrative   Right Handed.    Lives in a two story home    Lives with 73 year old son.    Social Determinants of Health   Financial Resource Strain: Low Risk  (01/18/2022)   Overall Financial Resource Strain (CARDIA)    Difficulty of Paying Living Expenses: Not hard at all  Food Insecurity: No Food Insecurity (06/04/2022)   Hunger Vital Sign    Worried About Running Out of Food in the Last Year: Never true    Ran Out of Food in the Last Year: Never true  Transportation Needs: No Transportation Needs (06/04/2022)  PRAPARE - Administrator, Civil Service (Medical): No    Lack of Transportation (Non-Medical): No  Physical Activity: Inactive (01/18/2022)   Exercise Vital Sign    Days of Exercise per Week: 0 days    Minutes of Exercise per Session: 0 min  Stress: Stress Concern Present (01/18/2022)   [employer] of Occupational Health - Occupational Stress Questionnaire    Feeling of Stress : To some extent  Social Connections: Socially Isolated (01/18/2022)   Social Connection and Isolation Panel [NHANES]    Frequency of Communication with Friends and Family: More than three times a week    Frequency of Social Gatherings with Friends and Family: More than three times a week    Attends Religious Services: Never    Database administrator or Organizations: No    Attends Banker Meetings: Never    Marital Status: Never married  Intimate Partner Violence: Not At Risk (01/18/2022)   Humiliation, Afraid, Rape, and Kick questionnaire    Fear of Current or Ex-Partner: No    Emotionally Abused: No    Physically Abused: No    Sexually Abused: No   Family History  Problem Relation Age of Onset   Diabetes Mother    Kidney failure Mother    BP 122/80   Pulse 87   Wt 74.8 kg (165 lb)   SpO2 98%   BMI 24.37 kg/m   Wt Readings from Last 3 Encounters:  06/22/22 74.8  kg (165 lb)  06/22/22 74.8 kg (165 lb)  06/16/22 71.3 kg (157 lb 3.2 oz)   PHYSICAL EXAM: General:  NAD. No resp difficulty, arrived in [facility] HEENT: Normal Neck: Supple. No JVD. Carotids 2+ bilat; no bruits. No lymphadenopathy or thryomegaly appreciated. Cor: PMI nondisplaced. Regular rate & rhythm. No rubs, gallops or murmurs. Lungs: Clear Abdomen: Soft, nontender, nondistended. No hepatosplenomegaly. No bruits or masses. Good bowel sounds. Extremities: No cyanosis, clubbing, rash, edema Neuro: Alert & oriented x 3, cranial nerves grossly intact. Moves all 4 extremities w/o difficulty. Affect pleasant.  ECG (personally reviewed): NSR 89 bpm, 1 PVC  REDs: 32%  ASSESSMENT & PLAN: Chronic Systolic HF/Cardiac amyloidosis - Onset not certain. He reports cardiomyopathy diagnosed just prior to MI while living in [place] a couple of years ago. - Echo (10/22): EF 20-25%, RV ok - R/LHC (10/22): Patent RPLV stent with nonobstructive disease elsewhere, Preserved CO/CI, RA mean 5 mmHg, PCWP mean 5 mmHg - cMRI (11/22): LVEF 24% RVEF 21% LGE and ECW (42%) suggestive of cardiac amyloidosis. 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 - Multiple myeloma panel (12/22) with no m-spike. 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 Volume looks OK today, weight up some but ReDs 32% - Continue torsemide 20 mg MWF. Can take extra 20 mg PRN weight gain/swelling. - Continue hydralazine 50 mg tid and Imdur 30 mg daily.  - Continue Entresto 24/26 mg bid. - Continue Jardiance 10 mg daily. - Continue carvedilol 6.25 mg bid.  - Continue spironolactone 25 mg daily.   - Continue Tafamadis.  - Repeat echo at next visit. If EF still < 35%, consider ICD - Labs today.  2. wtTTR cardiac amyloidosis - Continue tafamadis. - Has seen Dr. Tyson Alias  - Considered Patisiran for polyneuropathy, however current  therapies not approved for wild-type TTR   3. CAD - Prior MI followed by  PCI in [place] in either 2019 or 2020.  - Patent RPLV stent on [facility] 10/22. Also had 20% distal RCA, 30% p LAD, 70% d LAD, 60% OM2 - Stent placed 2020. >50yr since stent placement, could consider stopping Plavix d/t issues with noncompliance. - Recent ED visit with CP, felt bronchitic. ECG re-assuring and troponin neg x 3. BNP was 803, pain could be 2/2 to volume overload. - No further CP.  - Continue ASA.  - Continue atorvastatin 80 mg daily.   4. HTN - Blood pressure well controlled.  - Continue current regimen.  5. Uncontrolled DM - A1c down to 7.3 from 12. - Continue Jardiance.  6. PVCs - 1 PVC ECG today. - Asymptomatic.   7. Neuropathy - Unclear if due to DM2 or TTR - gabapentin not helping, now on Cymbalta 30. - Saw Dr. Allena Katz in Neurology as above, with wild type TTR, current therapies not approved. - Continue supportive care.  8. CKD stage IIIb - Baseline SCr 1.7-2.0 - BMET today.   9. Hx fall - CT: L-spine on 12/08/2021 was negative for acute fracture or subluxation of the L-spine. - No further falls. Neurology has arranged balance PT.  10. SDOH - He has Medicaid and disability. - Transportation is an issue, he has a car but unable to drive (needs license). - 20 year old son helps with meds. - Paramedicine follows, appreciated their assistance.  Arvilla Meres, MD  2:55 PM

## 2022-10-19 ENCOUNTER — Other Ambulatory Visit (HOSPITAL_COMMUNITY): Payer: Self-pay

## 2022-10-20 ENCOUNTER — Other Ambulatory Visit (HOSPITAL_COMMUNITY): Payer: Self-pay | Admitting: Physician Assistant

## 2022-10-20 ENCOUNTER — Other Ambulatory Visit (HOSPITAL_COMMUNITY): Payer: Self-pay | Admitting: Family Medicine

## 2022-10-20 ENCOUNTER — Ambulatory Visit (HOSPITAL_COMMUNITY)
Admission: RE | Admit: 2022-10-20 | Discharge: 2022-10-20 | Disposition: A | Discharge status: Home / Self Care | Payer: No Typology Code available for payment source | Source: Ambulatory Visit | Attending: Family Medicine | Admitting: Family Medicine

## 2022-10-20 ENCOUNTER — Other Ambulatory Visit (HOSPITAL_COMMUNITY): Payer: Self-pay

## 2022-10-20 ENCOUNTER — Other Ambulatory Visit (HOSPITAL_COMMUNITY): Payer: Self-pay | Admitting: Emergency Medicine

## 2022-10-20 ENCOUNTER — Encounter (HOSPITAL_COMMUNITY): Payer: Self-pay | Admitting: Internal Medicine

## 2022-10-20 ENCOUNTER — Other Ambulatory Visit (HOSPITAL_COMMUNITY): Payer: Self-pay | Admitting: Internal Medicine

## 2022-10-20 VITALS — BP 130/88 | HR 81 | Wt 156.0 lb

## 2022-10-20 DIAGNOSIS — Z7982 Long term (current) use of aspirin: Secondary | ICD-10-CM | POA: Insufficient documentation

## 2022-10-20 DIAGNOSIS — E1122 Type 2 diabetes mellitus with diabetic chronic kidney disease: Secondary | ICD-10-CM | POA: Insufficient documentation

## 2022-10-20 DIAGNOSIS — Z8673 Personal history of transient ischemic attack (TIA), and cerebral infarction without residual deficits: Secondary | ICD-10-CM | POA: Diagnosis not present

## 2022-10-20 DIAGNOSIS — N183 Chronic kidney disease, stage 3 unspecified: Secondary | ICD-10-CM

## 2022-10-20 DIAGNOSIS — Z87891 Personal history of nicotine dependence: Secondary | ICD-10-CM | POA: Insufficient documentation

## 2022-10-20 DIAGNOSIS — I5022 Chronic systolic (congestive) heart failure: Secondary | ICD-10-CM | POA: Insufficient documentation

## 2022-10-20 DIAGNOSIS — I2510 Atherosclerotic heart disease of native coronary artery without angina pectoris: Secondary | ICD-10-CM | POA: Diagnosis not present

## 2022-10-20 DIAGNOSIS — E1165 Type 2 diabetes mellitus with hyperglycemia: Secondary | ICD-10-CM | POA: Diagnosis not present

## 2022-10-20 DIAGNOSIS — Z5982 Transportation insecurity: Secondary | ICD-10-CM | POA: Insufficient documentation

## 2022-10-20 DIAGNOSIS — I130 Hypertensive heart and chronic kidney disease with heart failure and stage 1 through stage 4 chronic kidney disease, or unspecified chronic kidney disease: Secondary | ICD-10-CM | POA: Insufficient documentation

## 2022-10-20 DIAGNOSIS — Z9181 History of falling: Secondary | ICD-10-CM | POA: Insufficient documentation

## 2022-10-20 DIAGNOSIS — I5020 Unspecified systolic (congestive) heart failure: Secondary | ICD-10-CM

## 2022-10-20 DIAGNOSIS — Z91148 Patient's other noncompliance with medication regimen for other reason: Secondary | ICD-10-CM | POA: Diagnosis not present

## 2022-10-20 DIAGNOSIS — E1140 Type 2 diabetes mellitus with diabetic neuropathy, unspecified: Secondary | ICD-10-CM | POA: Insufficient documentation

## 2022-10-20 DIAGNOSIS — Z79899 Other long term (current) drug therapy: Secondary | ICD-10-CM | POA: Diagnosis not present

## 2022-10-20 DIAGNOSIS — I252 Old myocardial infarction: Secondary | ICD-10-CM | POA: Diagnosis not present

## 2022-10-20 DIAGNOSIS — I10 Essential (primary) hypertension: Secondary | ICD-10-CM | POA: Diagnosis not present

## 2022-10-20 DIAGNOSIS — Z7984 Long term (current) use of oral hypoglycemic drugs: Secondary | ICD-10-CM | POA: Diagnosis not present

## 2022-10-20 DIAGNOSIS — I493 Ventricular premature depolarization: Secondary | ICD-10-CM | POA: Insufficient documentation

## 2022-10-20 DIAGNOSIS — N1832 Chronic kidney disease, stage 3b: Secondary | ICD-10-CM | POA: Insufficient documentation

## 2022-10-20 DIAGNOSIS — I43 Cardiomyopathy in diseases classified elsewhere: Secondary | ICD-10-CM | POA: Diagnosis not present

## 2022-10-20 DIAGNOSIS — E854 Organ-limited amyloidosis: Secondary | ICD-10-CM | POA: Diagnosis not present

## 2022-10-20 MED ORDER — CARVEDILOL 6.25 MG PO TABS: 6.2500 mg | ORAL_TABLET | Freq: Two times a day (BID) | ORAL | 3 refills | Status: DC

## 2022-10-20 MED ORDER — CARVEDILOL 6.25 MG PO TABS
6.2500 mg | ORAL_TABLET | Freq: Two times a day (BID) | ORAL | 3 refills | Status: DC
  Filled 2023-02-03: qty 180, 90d supply, fill #0
  Filled 2023-04-27: qty 180, 90d supply, fill #1

## 2022-10-20 NOTE — Progress Notes (Signed)
Unscheduled home visit for med rec.  Pt. Forgot to bring his meds to his clinic appointment today so I actually saw him twice today.  Pt needs refills on Clopidogrel, Torsemide, Entresto, Spironolactone, Cymbalta, Isosorbide and Jardiance Sent message to H&V triage and Virginia Crews office for theses refills.  Somehow Hydralazine was sent to [facility] but we're not sure how.  Requested these medications be sent to [facility].  Took picture of his new insurance card to provide the information to [employer].    Beatrix Shipper, EMT-Paramedic [phone] 10/20/2022

## 2022-10-20 NOTE — Progress Notes (Signed)
Paramedicine Encounter   Patient ID: Thomas West , male,   DOB: [birth year],61 y.o.,  MRN: [id number]   Met patient in clinic today with provider.  Time spent with patient 30 minutes  Pt was seen by Thomas West today.  Thomas West was 40 minutes late for his appointment but was still able to be seen.  He is doing well with no complaints and no med changes at this time.  His will be scheduled for an ECHO for his next visit.  Beatrix Shipper, EMT-Paramedic  [phone] 10/20/2022

## 2022-10-20 NOTE — Patient Instructions (Signed)
There has been no changes to your medications.  Your physician has requested that you have an echocardiogram. Echocardiography is a painless test that uses sound waves to create images of your heart. It provides your doctor with information about the size and shape of your heart and how well your heart's chambers and valves are working. This procedure takes approximately one hour. There are no restrictions for this procedure. Please do NOT wear cologne, perfume, aftershave, or lotions (deodorant is allowed). Please arrive 15 minutes prior to your appointment time.   Your physician recommends that you schedule a follow-up appointment in: 3 months with an echocardiogram   If you have any questions or concerns before your next appointment please send us a message through mychart or call our office at [phone].    TO LEAVE A MESSAGE FOR THE NURSE SELECT OPTION 2, PLEASE LEAVE A MESSAGE INCLUDING: YOUR NAME DATE OF BIRTH CALL BACK NUMBER REASON FOR CALL**this is important as we prioritize the call backs  YOU WILL RECEIVE A CALL BACK THE SAME DAY AS LONG AS YOU CALL BEFORE 4:00 PM  At the Advanced [facility] Clinic, you and your health needs are our priority. As part of our continuing mission to provide you with exceptional heart care, we have created designated Provider Care Teams. These Care Teams include your primary Cardiologist (physician) and Advanced Practice Providers (APPs- Physician Assistants and Nurse Practitioners) who all work together to provide you with the care you need, when you need it.   You may see any of the following providers on your designated Care Team at your next follow up: Dr Daniel Bensimhon Dr Dalton McLean Dr. Aditya Sabharwal Amy Clegg, NP Brittainy Simmons, PA Jessica Milford,NP Lindsay Finch, PA Alma Diaz, NP Lauren Kemp, PharmD   Please be sure to bring in all your medications bottles to every appointment.    Thank you for choosing [facility]    

## 2022-10-21 ENCOUNTER — Other Ambulatory Visit: Payer: Self-pay

## 2022-10-21 ENCOUNTER — Other Ambulatory Visit (HOSPITAL_COMMUNITY): Payer: Self-pay

## 2022-10-21 DIAGNOSIS — I10 Essential (primary) hypertension: Secondary | ICD-10-CM

## 2022-10-21 MED ORDER — SPIRONOLACTONE 25 MG PO TABS
25.0000 mg | ORAL_TABLET | Freq: Every day | ORAL | 11 refills | Status: DC
  Filled 2022-10-21 – 2022-11-24 (×2): qty 30, 30d supply, fill #0
  Filled 2022-12-22 – 2022-12-28 (×4): qty 30, 30d supply, fill #1
  Filled 2023-01-19: qty 30, 30d supply, fill #2
  Filled 2023-02-25: qty 30, 30d supply, fill #3
  Filled 2023-03-28: qty 30, 30d supply, fill #4
  Filled 2023-04-26: qty 30, 30d supply, fill #5
  Filled 2023-05-20: qty 30, 30d supply, fill #6
  Filled 2023-06-20: qty 30, 30d supply, fill #7

## 2022-10-21 MED ORDER — ENTRESTO 24-26 MG PO TABS
1.0000 | ORAL_TABLET | Freq: Two times a day (BID) | ORAL | 3 refills | Status: DC
  Filled 2022-10-21: qty 60, 30d supply, fill #0
  Filled 2022-11-24: qty 60, 30d supply, fill #1
  Filled 2022-12-22 – 2022-12-28 (×4): qty 60, 30d supply, fill #2
  Filled 2023-01-19: qty 60, 30d supply, fill #3

## 2022-10-21 MED ORDER — ISOSORBIDE MONONITRATE ER 30 MG PO TB24
30.0000 mg | ORAL_TABLET | Freq: Every day | ORAL | 11 refills | Status: DC
  Filled 2022-10-21 – 2023-01-19 (×2): qty 30, 30d supply, fill #0
  Filled 2023-02-25: qty 30, 30d supply, fill #1

## 2022-10-21 MED ORDER — TORSEMIDE 20 MG PO TABS
20.0000 mg | ORAL_TABLET | Freq: Every day | ORAL | 3 refills | Status: DC
  Filled 2022-10-21 – 2022-11-24 (×3): qty 30, 30d supply, fill #0
  Filled 2022-12-22 – 2023-01-19 (×3): qty 30, 30d supply, fill #1

## 2022-10-21 MED ORDER — ISOSORBIDE MONONITRATE ER 30 MG PO TB24
30.0000 mg | ORAL_TABLET | Freq: Every day | ORAL | 6 refills | Status: DC
  Filled 2022-10-21: qty 30, 30d supply, fill #0

## 2022-10-21 MED ORDER — HYDRALAZINE HCL 50 MG PO TABS
75.0000 mg | ORAL_TABLET | Freq: Three times a day (TID) | ORAL | 11 refills | Status: DC
Start: 2022-10-21 — End: 2023-02-02
  Filled 2022-10-21 – 2023-01-19 (×2): qty 135, 30d supply, fill #0

## 2022-10-21 MED ORDER — DULOXETINE HCL 30 MG PO CPEP
30.0000 mg | ORAL_CAPSULE | Freq: Every day | ORAL | 3 refills | Status: DC
  Filled 2022-10-21: qty 30, 30d supply, fill #0
  Filled 2022-11-24: qty 30, 30d supply, fill #1
  Filled 2022-12-22 – 2022-12-28 (×4): qty 30, 30d supply, fill #2
  Filled 2023-01-19: qty 30, 30d supply, fill #3

## 2022-10-25 ENCOUNTER — Other Ambulatory Visit (HOSPITAL_COMMUNITY): Payer: Self-pay

## 2022-10-25 ENCOUNTER — Encounter: Payer: Self-pay | Admitting: Neurology

## 2022-10-25 ENCOUNTER — Ambulatory Visit: Payer: Medicare Other | Admitting: Neurology

## 2022-10-25 ENCOUNTER — Telehealth: Payer: Self-pay

## 2022-10-25 DIAGNOSIS — I2510 Atherosclerotic heart disease of native coronary artery without angina pectoris: Secondary | ICD-10-CM

## 2022-10-25 DIAGNOSIS — E1140 Type 2 diabetes mellitus with diabetic neuropathy, unspecified: Secondary | ICD-10-CM

## 2022-10-25 DIAGNOSIS — I5020 Unspecified systolic (congestive) heart failure: Secondary | ICD-10-CM

## 2022-10-25 MED ORDER — LINAGLIPTIN 5 MG PO TABS
5.0000 mg | ORAL_TABLET | Freq: Every day | ORAL | 3 refills | Status: DC
  Filled 2022-10-25 – 2022-11-24 (×2): qty 30, 30d supply, fill #0
  Filled 2022-12-22 – 2022-12-28 (×4): qty 30, 30d supply, fill #1
  Filled 2023-01-19: qty 30, 30d supply, fill #2
  Filled 2023-02-25: qty 30, 30d supply, fill #3

## 2022-10-25 MED ORDER — CLOPIDOGREL BISULFATE 75 MG PO TABS
75.0000 mg | ORAL_TABLET | Freq: Every day | ORAL | 0 refills | Status: DC
Start: 2022-10-25 — End: 2023-03-28
  Filled 2022-10-25: qty 90, 90d supply, fill #0
  Filled 2022-10-28: qty 30, 30d supply, fill #0
  Filled 2023-01-19: qty 30, 30d supply, fill #1
  Filled 2023-02-25: qty 30, 30d supply, fill #2

## 2022-10-25 MED ORDER — EZETIMIBE 10 MG PO TABS
10.0000 mg | ORAL_TABLET | Freq: Every day | ORAL | 3 refills | Status: DC
  Filled 2022-10-25 – 2022-11-24 (×2): qty 30, 30d supply, fill #0
  Filled 2022-12-22 – 2022-12-25 (×2): qty 15, 15d supply, fill #1
  Filled 2022-12-27 – 2022-12-28 (×3): qty 30, 30d supply, fill #1
  Filled 2023-01-19: qty 30, 30d supply, fill #2
  Filled 2023-02-25: qty 30, 30d supply, fill #3

## 2022-10-25 MED ORDER — EMPAGLIFLOZIN 25 MG PO TABS
25.0000 mg | ORAL_TABLET | Freq: Every day | ORAL | 1 refills | Status: DC
Start: 2022-10-25 — End: 2023-04-26
  Filled 2022-10-25 – 2022-11-02 (×2): qty 90, 90d supply, fill #0
  Filled 2023-01-19: qty 90, 90d supply, fill #1

## 2022-10-25 NOTE — Telephone Encounter (Signed)
-----   Message from Beatrix Shipper, Paramedic sent at 10/20/2022  6:11 PM EDT ----- Regarding: [name], Thomas West needs refills on the following meds: Trajenta, Zetia, Jardiance 25mg  and Clopidogrel  Wallace Keller [phone]  Thank you so much!    Beatrix Shipper, EMT-Paramedic [phone] 10/20/2022

## 2022-10-27 ENCOUNTER — Other Ambulatory Visit (HOSPITAL_COMMUNITY): Payer: Self-pay | Admitting: Emergency Medicine

## 2022-10-27 NOTE — Progress Notes (Signed)
Paramedicine Encounter    Patient ID: Thomas West, male    DOB: Nov 06, 1960, 62 y.o.   MRN: [id number]   Complaints***  Assessment***  Compliance with meds***  Pill box filled***  Refills needed***  Meds changes since last visit***    Social changes***   There were no vitals taken for this visit. Weight yesterday-*** Last visit weight-***  ACTION: {Paramed [phone]}  Beatrix Shipper, EMT-Paramedic [phone] 10/27/22  Patient Care Team: [name], [name], MD as PCP - General (Family Medicine) Bensimhon, Bevelyn Buckles, MD as PCP - Cardiology (Cardiology) Glendale Chard, DO as Consulting Physician (Neurology) Sherrill Raring, [facility] (Pharmacist)  Patient Active Problem List   Diagnosis Date Noted  . 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 Pruritic rash 05/25/2022  . Polyneuropathy associated with underlying disease (HCC) 01/15/2022  . Atherosclerosis of aorta (HCC) 01/15/2022  . History of CVA (cerebrovascular accident) without residual deficits 12/24/2021  . AKI (acute kidney injury) (HCC) 12/13/2021  . Diarrhea 12/13/2021  . Shortness of breath 12/13/2021  . Chest pain 12/13/2021  . Nausea and vomiting 12/13/2021  . Heart failure (HCC) 12/08/2021  . Hypertension associated with diabetes (HCC) 07/12/2021  . Chronic kidney disease, stage 3a (HCC) 07/12/2021  . Hyperkalemia 07/12/2021  . CHF exacerbation (HCC) 04/20/2021  . HFrEF (heart failure with reduced ejection fraction) (HCC) 02/03/2021  . Diabetes mellitus (HCC) 03/27/2019  . Hyperlipidemia associated with type 2 diabetes mellitus (HCC) 03/27/2019  . CAD (coronary artery disease) 03/05/2019  . History of MI (myocardial infarction) 03/05/2019  . Type 2 diabetes mellitus with diabetic neuropathy, unspecified (HCC) 03/05/2019  . HLD (hyperlipidemia) 03/05/2019  . GERD (gastroesophageal reflux disease) 03/05/2019  . Hypertension, essential, benign 03/05/2019    Current Outpatient Medications:  .   aspirin EC 81 MG tablet, Take 1 tablet (81 mg total) by mouth daily. Swallow whole., Disp: 30 tablet, Rfl: 12 .  atorvastatin (LIPITOR) 80 MG tablet, Take 1 tablet (80 mg total) by mouth daily., Disp: 90 tablet, Rfl: 3 .  carvedilol (COREG) 6.25 MG tablet, Take 1 tablet (6.25 mg total) by mouth 2 (two) times daily., Disp: 180 tablet, Rfl: 3 .  clopidogrel (PLAVIX) 75 MG tablet, Take 1 tablet (75 mg total) by mouth daily., Disp: 90 tablet, Rfl: 0 .  Continuous Glucose Receiver (DEXCOM G7 RECEIVER) DEVI, Use to check blood sugars (Patient not taking: Reported on 10/20/2022), Disp: 3 each, Rfl: 3 .  Continuous Glucose Sensor (DEXCOM G7 SENSOR) MISC, Use to check blood sugars as directed. Change every 10 days. (Patient not taking: Reported on 10/20/2022), Disp: 3 each, Rfl: 2 .  cyanocobalamin 1000 MCG tablet, TAKE ONE TABLET BY MOUTH DAILY, Disp: 30 tablet, Rfl: 1 .  DULoxetine (CYMBALTA) 30 MG capsule, Take 1 capsule (30 mg total) by mouth daily., Disp: 30 capsule, Rfl: 3 .  empagliflozin (JARDIANCE) 25 MG TABS tablet, Take 1 tablet (25 mg total) by mouth daily before breakfast., Disp: 90 tablet, Rfl: 1 .  ezetimibe (ZETIA) 10 MG tablet, Take 1 tablet (10 mg total) by mouth daily., Disp: 30 tablet, Rfl: 3 .  hydrALAZINE (APRESOLINE) 50 MG tablet, Take 1.5 tablets (75 mg total) by mouth 3 (three) times daily., Disp: 135 tablet, Rfl: 11 .  hydrocortisone cream 1 %, Apply 1 Application topically 2 (two) times daily as needed for itching., Disp: 45 g, Rfl: 1 .  isosorbide mononitrate (IMDUR) 30 MG 24 hr tablet, Take 1 tablet (30 mg total) by mouth daily., Disp: 30 tablet, Rfl: 11 .  linagliptin (TRADJENTA) 5 MG TABS tablet, Take 1 tablet (5 mg total) by mouth daily., Disp: 30 tablet, Rfl: 3 .  sacubitril-valsartan (ENTRESTO) 24-26 MG, Take 1 tablet by mouth 2 (two) times daily., Disp: 60 tablet, Rfl: 3 .  spironolactone (ALDACTONE) 25 MG tablet, Take 1 tablet (25 mg total) by mouth daily., Disp: 30 tablet,  Rfl: 11 .  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  torsemide (DEMADEX) 20 MG tablet, Take 1 tablet (20 mg total) by mouth daily., Disp: 30 tablet, Rfl: 3 Allergies  Allergen Reactions  . Bee Venom Anaphylaxis, Swelling and Other (See Comments)    Swells all over     Social History   Socioeconomic History  . Marital status: Single    Spouse name: Not on file  . Number of children: Not on file  . Years of education: Not on file  . Highest education level: Not on file  Occupational History  . Not on file  Tobacco Use  . Smoking status: Former    Types: Cigarettes    Quit date: 10/06/2017    Years since quitting: 5.0  . Smokeless tobacco: Never  Substance and Sexual Activity  . Alcohol use: Yes    Comment: Occasional Beer  . Drug use: Yes    Types: Marijuana  . Sexual activity: Not on file  Other Topics Concern  . Not on file  Social History Narrative   Right Handed.    Lives in a two story home    Lives with 60 year old son.    Social Determinants of Health   Financial Resource Strain: Low Risk  (01/18/2022)   Overall Financial Resource Strain (CARDIA)   . Difficulty of Paying Living Expenses: Not hard at all  Food Insecurity: No Food Insecurity (09/21/2022)   Hunger Vital Sign   . Worried About Programme researcher, broadcasting/film/video in the Last Year: Never true   . Ran Out of Food in the Last Year: Never true  Transportation Needs: No Transportation Needs (06/04/2022)   PRAPARE - Transportation   . Lack of Transportation (Medical): No   . Lack of Transportation (Non-Medical): No  Physical Activity: Inactive (01/18/2022)   Exercise Vital Sign   . Days of Exercise per Week: 0 days   . Minutes of Exercise per Session: 0 min  Stress: Stress Concern Present (01/18/2022)   [employer] of Occupational Health - Occupational Stress Questionnaire   . Feeling of Stress : To some extent  Social Connections: Socially Isolated (01/18/2022)   Social  Connection and Isolation Panel [NHANES]   . Frequency of Communication with Friends and Family: More than three times a week   . Frequency of Social Gatherings with Friends and Family: More than three times a week   . Attends Religious Services: Never   . Active Member of Clubs or Organizations: No   . Attends Banker Meetings: Never   . Marital Status: Never married  Intimate Partner Violence: Not At Risk (01/18/2022)   Humiliation, Afraid, Rape, and Kick questionnaire   . Fear of Current or Ex-Partner: No   . Emotionally Abused: No   . Physically Abused: No   . Sexually Abused: No    Physical Exam      Future Appointments  Date Time Provider Department Center  02/02/2023 10:00 AM [facility] ECHO OP 1 [facility]  02/02/2023 11:00 AM Bensimhon, Bevelyn Buckles, MD [facility] None

## 2022-10-28 ENCOUNTER — Other Ambulatory Visit (HOSPITAL_COMMUNITY): Payer: Self-pay

## 2022-10-29 ENCOUNTER — Other Ambulatory Visit (HOSPITAL_COMMUNITY): Payer: Self-pay

## 2022-11-01 ENCOUNTER — Other Ambulatory Visit (HOSPITAL_COMMUNITY): Payer: Self-pay

## 2022-11-02 ENCOUNTER — Other Ambulatory Visit (HOSPITAL_COMMUNITY): Payer: Self-pay

## 2022-11-02 ENCOUNTER — Other Ambulatory Visit (HOSPITAL_COMMUNITY): Payer: Self-pay | Admitting: Emergency Medicine

## 2022-11-02 NOTE — Progress Notes (Signed)
Paramedicine Encounter    Patient ID: Thomas West, male    DOB: 02-23-1961, 62 y.o.   MRN: [id number]   Complaints NONE  Assessment A&O x 4, skin W&D w/ good color and temp. Lung sounds with ronchi in the upper lobes.  Non-productive cough.    Compliance with meds Gap for Carvedilol and Clopidogrel due to meds coming by mail.  Pill box filled x 1 week.  Refills needed: Vyndamax, Jardiance 25mg .   Meds changes since last visit NONE    Social changes NONE   BP (!) 160/90 (BP Location: Left Arm, Patient Position: Sitting, Cuff Size: Normal)   Pulse 87   Resp 14   Wt 159 lb 12.8 oz (72.5 kg)   SpO2 95%   BMI 23.60 kg/m  Weight yesterday- not taken Last visit weight-160lb  Mr. Jaecks reports to be feeling well.  Blood pressure is elevated today but he states he just took his noon dose of Hydralazine a few minutes prior to my arrival.  He denies headache, dizziness and presents with no obvious neurologic deficits.   Called [facility] to discontinue mail-order meds and had them transferred back to [facility].  Deconstructed two boxes of pill packs back into bottles.  Called in refills for Jardiance & Tafamidis.  2 weeks worth of meds reconciled.  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   Pruritic rash 05/25/2022   Polyneuropathy associated with underlying disease (HCC) 01/15/2022   Atherosclerosis of aorta (HCC) 01/15/2022   History of CVA (cerebrovascular accident) without residual deficits 12/24/2021   AKI (acute kidney injury) (HCC) 12/13/2021   Diarrhea 12/13/2021   Shortness of breath 12/13/2021   Chest pain  12/13/2021   Nausea and vomiting 12/13/2021   Heart failure (HCC) 12/08/2021   Hypertension associated with diabetes (HCC) 07/12/2021   Chronic kidney disease, stage 3a (HCC) 07/12/2021   Hyperkalemia 07/12/2021   CHF exacerbation (HCC) 04/20/2021   HFrEF (heart failure with reduced ejection fraction) (HCC) 02/03/2021   Diabetes mellitus (HCC) 03/27/2019   Hyperlipidemia associated with type 2 diabetes mellitus (HCC) 03/27/2019   CAD (coronary artery disease) 03/05/2019   History of MI (myocardial infarction) 03/05/2019   Type 2 diabetes mellitus with diabetic neuropathy, unspecified (HCC) 03/05/2019   HLD (hyperlipidemia) 03/05/2019   GERD (gastroesophageal reflux disease) 03/05/2019   Hypertension, essential, benign 03/05/2019    Current Outpatient Medications:    aspirin EC 81 MG tablet, Take 1 tablet (81 mg total) by mouth daily. Swallow whole., Disp: 30 tablet, Rfl: 12   atorvastatin (LIPITOR) 80 MG tablet, Take 1 tablet (80 mg total) by mouth daily., Disp: 90 tablet, Rfl: 3   carvedilol (COREG) 6.25 MG tablet, Take 1 tablet (6.25 mg total) by mouth 2 (two) times daily., Disp: 180 tablet, Rfl: 3   clopidogrel (PLAVIX) 75 MG tablet, Take 1 tablet (75 mg total) by mouth daily., Disp: 90 tablet, Rfl: 0   cyanocobalamin 1000 MCG tablet, TAKE ONE TABLET BY MOUTH DAILY, Disp: 30 tablet, Rfl: 1   DULoxetine (CYMBALTA) 30 MG capsule, Take 1 capsule (30 mg total) by mouth daily., Disp: 30 capsule, Rfl: 3   empagliflozin (JARDIANCE) 25 MG TABS tablet,  Take 1 tablet (25 mg total) by mouth daily before breakfast., Disp: 90 tablet, Rfl: 1   ezetimibe (ZETIA) 10 MG tablet, Take 1 tablet (10 mg total) by mouth daily., Disp: 30 tablet, Rfl: 3   hydrALAZINE (APRESOLINE) 50 MG tablet, Take 1.5 tablets (75 mg total) by mouth 3 (three) times daily., Disp: 135 tablet, Rfl: 11   hydrocortisone cream 1 %, Apply 1 Application topically 2 (two) times daily as needed for itching., Disp: 45 g, Rfl: 1    isosorbide mononitrate (IMDUR) 30 MG 24 hr tablet, Take 1 tablet (30 mg total) by mouth daily., Disp: 30 tablet, Rfl: 11   linagliptin (TRADJENTA) 5 MG TABS tablet, Take 1 tablet (5 mg total) by mouth daily., Disp: 30 tablet, Rfl: 3   sacubitril-valsartan (ENTRESTO) 24-26 MG, Take 1 tablet by mouth 2 (two) times daily., Disp: 60 tablet, Rfl: 3   spironolactone (ALDACTONE) 25 MG tablet, Take 1 tablet (25 mg total) by mouth daily., Disp: 30 tablet, Rfl: 11   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, Take 1 capsule by mouth daily., Disp: 30 capsule, Rfl: 11   torsemide (DEMADEX) 20 MG tablet, Take 1 tablet (20 mg total) by mouth daily., Disp: 30 tablet, Rfl: 3   Continuous Glucose Receiver (DEXCOM G7 RECEIVER) DEVI, Use to check blood sugars (Patient not taking: Reported on 10/20/2022), Disp: 3 each, Rfl: 3   Continuous Glucose Sensor (DEXCOM G7 SENSOR) MISC, Use to check blood sugars as directed. Change every 10 days. (Patient not taking: Reported on 10/20/2022), Disp: 3 each, Rfl: 2 Allergies  Allergen Reactions   Bee Venom Anaphylaxis, Swelling and Other (See Comments)    Swells all over     Social History   Socioeconomic History   Marital status: Single    Spouse name: Not on file   Number of children: Not on file   Years of education: Not on file   Highest education level: Not on file  Occupational History   Not on file  Tobacco Use   Smoking status: Former    Types: Cigarettes    Quit date: 10/06/2017    Years since quitting: 5.0   Smokeless tobacco: Never  Substance and Sexual Activity   Alcohol use: Yes    Comment: Occasional Beer   Drug use: Yes    Types: Marijuana   Sexual activity: Not on file  Other Topics Concern   Not on file  Social History Narrative   Right Handed.    Lives in a two story home    Lives with 15 year old son.    Social Determinants of Health   Financial Resource Strain: Low Risk  (01/18/2022)   Overall Financial Resource Strain (CARDIA)    Difficulty of Paying  Living Expenses: Not hard at all  Food Insecurity: No Food Insecurity (09/21/2022)   Hunger Vital Sign    Worried About Running Out of Food in the Last Year: Never true    Ran Out of Food in the Last Year: Never true  Transportation Needs: No Transportation Needs (06/04/2022)   PRAPARE - Administrator, Civil Service (Medical): No    Lack of Transportation (Non-Medical): No  Physical Activity: Inactive (01/18/2022)   Exercise Vital Sign    Days of Exercise per Week: 0 days    Minutes of Exercise per Session: 0 min  Stress: Stress Concern Present (01/18/2022)   [employer] of Occupational Health - Occupational Stress Questionnaire    Feeling of Stress : To some  extent  Social Connections: Socially Isolated (01/18/2022)   Social Connection and Isolation Panel [NHANES]    Frequency of Communication with Friends and Family: More than three times a week    Frequency of Social Gatherings with Friends and Family: More than three times a week    Attends Religious Services: Never    Database administrator or Organizations: No    Attends Banker Meetings: Never    Marital Status: Never married  Intimate Partner Violence: Not At Risk (01/18/2022)   Humiliation, Afraid, Rape, and Kick questionnaire    Fear of Current or Ex-Partner: No    Emotionally Abused: No    Physically Abused: No    Sexually Abused: No    Physical Exam      Future Appointments  Date Time Provider Department Center  12/06/2022 10:30 AM Nita Sickle K, DO [facility] None  02/02/2023 10:00 AM [facility] E[facility] OP 1 [facility]  02/02/2023 11:00 AM Bensimhon, Bevelyn Buckles, MD [facility] None

## 2022-11-03 ENCOUNTER — Other Ambulatory Visit: Payer: Self-pay

## 2022-11-03 ENCOUNTER — Other Ambulatory Visit (HOSPITAL_COMMUNITY): Payer: Self-pay

## 2022-11-04 ENCOUNTER — Other Ambulatory Visit (HOSPITAL_COMMUNITY): Payer: Self-pay

## 2022-11-06 ENCOUNTER — Other Ambulatory Visit (HOSPITAL_COMMUNITY): Payer: Self-pay

## 2022-11-09 ENCOUNTER — Other Ambulatory Visit: Payer: Self-pay

## 2022-11-10 ENCOUNTER — Other Ambulatory Visit (HOSPITAL_COMMUNITY): Payer: Self-pay

## 2022-11-10 MED ORDER — CLOPIDOGREL BISULFATE 75 MG PO TABS: 75.0000 mg | ORAL_TABLET | Freq: Every day | ORAL | 0 refills | Status: DC

## 2022-11-10 MED ORDER — ATORVASTATIN CALCIUM 80 MG PO TABS: 80.0000 mg | ORAL_TABLET | Freq: Every day | ORAL | 0 refills | Status: DC

## 2022-11-10 MED ORDER — HYDRALAZINE HCL 50 MG PO TABS
75.0000 mg | ORAL_TABLET | Freq: Three times a day (TID) | ORAL | 9 refills | Status: DC
  Filled 2023-01-28: qty 135, 30d supply, fill #0

## 2022-11-10 MED ORDER — TORSEMIDE 20 MG PO TABS
20.0000 mg | ORAL_TABLET | Freq: Every day | ORAL | 2 refills | Status: DC
  Filled 2022-12-22 – 2022-12-28 (×3): qty 30, 30d supply, fill #0

## 2022-11-10 MED ORDER — SPIRONOLACTONE 25 MG PO TABS
25.0000 mg | ORAL_TABLET | Freq: Every day | ORAL | 4 refills | Status: DC
  Filled 2022-12-22: qty 30, 30d supply, fill #0

## 2022-11-10 MED ORDER — CARVEDILOL 6.25 MG PO TABS
6.2500 mg | ORAL_TABLET | Freq: Two times a day (BID) | ORAL | 6 refills | Status: DC
  Filled 2023-01-19: qty 60, 30d supply, fill #0

## 2022-11-12 ENCOUNTER — Other Ambulatory Visit (HOSPITAL_COMMUNITY): Payer: Self-pay

## 2022-11-24 ENCOUNTER — Other Ambulatory Visit: Payer: Self-pay

## 2022-11-24 ENCOUNTER — Other Ambulatory Visit (HOSPITAL_COMMUNITY): Payer: Self-pay | Admitting: Internal Medicine

## 2022-11-24 ENCOUNTER — Other Ambulatory Visit (HOSPITAL_COMMUNITY): Payer: Self-pay

## 2022-11-24 ENCOUNTER — Other Ambulatory Visit (HOSPITAL_COMMUNITY): Payer: Self-pay | Admitting: Emergency Medicine

## 2022-11-24 MED ORDER — VYNDAMAX 61 MG PO CAPS
61.0000 mg | ORAL_CAPSULE | Freq: Every day | ORAL | 11 refills | Status: DC
  Filled 2022-11-24: qty 30, 30d supply, fill #0
  Filled 2022-12-22 – 2022-12-30 (×2): qty 30, 30d supply, fill #1
  Filled 2023-02-01: qty 30, 30d supply, fill #2
  Filled 2023-03-23 – 2023-03-30 (×2): qty 30, 30d supply, fill #3
  Filled 2023-04-26: qty 30, 30d supply, fill #4
  Filled 2023-05-12: qty 30, 30d supply, fill #5
  Filled 2023-06-16: qty 30, 30d supply, fill #6
  Filled 2023-07-13: qty 30, 30d supply, fill #7
  Filled 2023-08-16: qty 30, 30d supply, fill #8
  Filled 2023-09-15: qty 30, 30d supply, fill #9
  Filled 2023-10-11: qty 30, 30d supply, fill #10
  Filled 2023-11-17 – 2023-11-18 (×2): qty 30, 30d supply, fill #11

## 2022-11-24 NOTE — Progress Notes (Signed)
Paramedicine Encounter    Patient ID: Thomas West, male    DOB: Jun 15, 1960, 62 y.o.   MRN: [id number]   Complaints NONE  Assessment A&O x 4, skin W&D w/ good color.  Lung sounds clear bilat. No edema noted  Compliance with meds YES  Pill box filled x 1 week  Refills needed Vyndamax, Torsemide, Entresto, Spironolactone, Trajenta, Duloxetine, Zetia Meds changes since last visit NONE    Social changes NONE   BP (!) 170/100 (BP Location: Left Arm, Patient Position: Sitting, Cuff Size: Normal)   Pulse 80   Resp 16   Wt 160 lb 3.2 oz (72.7 kg)   SpO2 97%   BMI 23.66 kg/m  Weight yesterday- not taken Last visit weight-159lb   Thomas West's BP is running high today and he states that he has not yet taken his morning meds.  He denies headache or visual disturbances.  No obvious neurological deficits noted.  Had him take his meds while I was there.  I will reach out to him on Friday for follow up.     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   Pruritic rash 05/25/2022   Polyneuropathy associated with underlying disease (HCC) 01/15/2022   Atherosclerosis of aorta (HCC) 01/15/2022   History of CVA (cerebrovascular accident) without residual deficits 12/24/2021   AKI (acute kidney injury) (HCC) 12/13/2021   Diarrhea 12/13/2021   Shortness of breath 12/13/2021   Chest pain 12/13/2021   Nausea and vomiting 12/13/2021   Heart failure (HCC) 12/08/2021   Hypertension associated with diabetes (HCC) 07/12/2021   Chronic kidney disease, stage 3a (HCC) 07/12/2021   Hyperkalemia 07/12/2021   CHF exacerbation (HCC)  04/20/2021   HFrEF (heart failure with reduced ejection fraction) (HCC) 02/03/2021   Diabetes mellitus (HCC) 03/27/2019   Hyperlipidemia associated with type 2 diabetes mellitus (HCC) 03/27/2019   CAD (coronary artery disease) 03/05/2019   History of MI (myocardial infarction) 03/05/2019   Type 2 diabetes mellitus with diabetic neuropathy, unspecified (HCC) 03/05/2019   HLD (hyperlipidemia) 03/05/2019   GERD (gastroesophageal reflux disease) 03/05/2019   Hypertension, essential, benign 03/05/2019    Current Outpatient Medications:    aspirin EC 81 MG tablet, Take 1 tablet (81 mg total) by mouth daily. Swallow whole., Disp: 30 tablet, Rfl: 12   atorvastatin (LIPITOR) 80 MG tablet, Take 1 tablet (80 mg total) by mouth daily., Disp: 90 tablet, Rfl: 3   carvedilol (COREG) 6.25 MG tablet, Take 1 tablet (6.25 mg total) by mouth every 12 (twelve) hours at 10am and 10pm., Disp: 60 tablet, Rfl: 6   clopidogrel (PLAVIX) 75 MG tablet, Take 1 tablet (75 mg total) by mouth daily., Disp: 90 tablet, Rfl: 0   clopidogrel (PLAVIX) 75 MG tablet, Take 1 tablet (75 mg total) by mouth daily at 9am., Disp: 90 tablet, Rfl: 0   cyanocobalamin 1000 MCG tablet, TAKE ONE TABLET BY MOUTH DAILY, Disp: 30 tablet, Rfl: 1   DULoxetine (CYMBALTA) 30 MG capsule, Take 1 capsule (30 mg total) by mouth daily., Disp: 30 capsule, Rfl: 3   empagliflozin (JARDIANCE) 25 MG TABS tablet, Take 1 tablet (25 mg total) by mouth daily before breakfast., Disp: 90 tablet,  Rfl: 1   ezetimibe (ZETIA) 10 MG tablet, Take 1 tablet (10 mg total) by mouth daily., Disp: 30 tablet, Rfl: 3   hydrALAZINE (APRESOLINE) 50 MG tablet, Take 1.5 tablets (75 mg total) by mouth 3 (three) times daily., Disp: 135 tablet, Rfl: 11   hydrALAZINE (APRESOLINE) 50 MG tablet, Take 1.5 tablets (75 mg total) by mouth at 9am, 1pm and 5 pm., Disp: 135 tablet, Rfl: 9   hydrocortisone cream 1 %, Apply 1 Application topically 2 (two) times daily as needed for itching., Disp:  45 g, Rfl: 1   isosorbide mononitrate (IMDUR) 30 MG 24 hr tablet, Take 1 tablet (30 mg total) by mouth daily., Disp: 30 tablet, Rfl: 11   linagliptin (TRADJENTA) 5 MG TABS tablet, Take 1 tablet (5 mg total) by mouth daily., Disp: 30 tablet, Rfl: 3   sacubitril-valsartan (ENTRESTO) 24-26 MG, Take 1 tablet by mouth 2 (two) times daily., Disp: 60 tablet, Rfl: 3   spironolactone (ALDACTONE) 25 MG tablet, Take 1 tablet (25 mg total) by mouth daily., Disp: 30 tablet, Rfl: 11   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, Take 1 capsule by mouth daily., Disp: 30 capsule, Rfl: 11   torsemide (DEMADEX) 20 MG tablet, Take 1 tablet (20 mg total) by mouth daily., Disp: 30 tablet, Rfl: 3   atorvastatin (LIPITOR) 80 MG tablet, Take 1 tablet (80 mg total) by mouth daily at 5 pm., Disp: 90 tablet, Rfl: 0   carvedilol (COREG) 6.25 MG tablet, Take 1 tablet (6.25 mg total) by mouth 2 (two) times daily., Disp: 180 tablet, Rfl: 3   Continuous Glucose Receiver (DEXCOM G7 RECEIVER) DEVI, Use to check blood sugars (Patient not taking: Reported on 11/24/2022), Disp: 3 each, Rfl: 3   Continuous Glucose Sensor (DEXCOM G7 SENSOR) MISC, Use to check blood sugars as directed. Change every 10 days. (Patient not taking: Reported on 10/20/2022), Disp: 3 each, Rfl: 2   spironolactone (ALDACTONE) 25 MG tablet, Take 1 tablet (25 mg total) by mouth daily., Disp: 30 tablet, Rfl: 4   torsemide (DEMADEX) 20 MG tablet, Take 1 tablet (20 mg total) by mouth daily at 9am., Disp: 30 tablet, Rfl: 2 Allergies  Allergen Reactions   Bee Venom Anaphylaxis, Swelling and Other (See Comments)    Swells all over     Social History   Socioeconomic History   Marital status: Single    Spouse name: Not on file   Number of children: Not on file   Years of education: Not on file   Highest education level: Not on file  Occupational History   Not on file  Tobacco Use   Smoking status: Former    Types: Cigarettes    Quit date: 10/06/2017    Years since quitting: 5.1    Smokeless tobacco: Never  Substance and Sexual Activity   Alcohol use: Yes    Comment: Occasional Beer   Drug use: Yes    Types: Marijuana   Sexual activity: Not on file  Other Topics Concern   Not on file  Social History Narrative   Right Handed.    Lives in a two story home    Lives with 33 year old son.    Social Determinants of Health   Financial Resource Strain: Low Risk  (01/18/2022)   Overall Financial Resource Strain (CARDIA)    Difficulty of Paying Living Expenses: Not hard at all  Food Insecurity: No Food Insecurity (09/21/2022)   Hunger Vital Sign    Worried About Running Out of  Food in the Last Year: Never true    Ran Out of Food in the Last Year: Never true  Transportation Needs: No Transportation Needs (06/04/2022)   PRAPARE - Administrator, Civil Service (Medical): No    Lack of Transportation (Non-Medical): No  Physical Activity: Inactive (01/18/2022)   Exercise Vital Sign    Days of Exercise per Week: 0 days    Minutes of Exercise per Session: 0 min  Stress: Stress Concern Present (01/18/2022)   [employer] of Occupational Health - Occupational Stress Questionnaire    Feeling of Stress : To some extent  Social Connections: Socially Isolated (01/18/2022)   Social Connection and Isolation Panel [NHANES]    Frequency of Communication with Friends and Family: More than three times a week    Frequency of Social Gatherings with Friends and Family: More than three times a week    Attends Religious Services: Never    Database administrator or Organizations: No    Attends Banker Meetings: Never    Marital Status: Never married  Intimate Partner Violence: Not At Risk (01/18/2022)   Humiliation, Afraid, Rape, and Kick questionnaire    Fear of Current or Ex-Partner: No    Emotionally Abused: No    Physically Abused: No    Sexually Abused: No    Physical Exam      Future Appointments  Date Time Provider Department Center   12/06/2022 10:30 AM Nita Sickle K, DO [facility] None  02/02/2023 10:00 AM [facility] OP 1 [facility]  02/02/2023 11:00 AM Bensimhon, Bevelyn Buckles, MD [facility] None

## 2022-11-26 ENCOUNTER — Other Ambulatory Visit: Payer: Self-pay

## 2022-11-29 ENCOUNTER — Other Ambulatory Visit (HOSPITAL_COMMUNITY): Payer: Self-pay

## 2022-11-30 ENCOUNTER — Other Ambulatory Visit (HOSPITAL_COMMUNITY): Payer: Self-pay | Admitting: Emergency Medicine

## 2022-11-30 ENCOUNTER — Other Ambulatory Visit (HOSPITAL_COMMUNITY): Payer: Self-pay

## 2022-11-30 NOTE — Progress Notes (Signed)
Paramedicine Encounter    Patient ID: Thomas West, male    DOB: Jun 15, 1960, 62 y.o.   MRN: [id number]   Complaints NONE  Assessment A&O x 4, skin W&D w/ good color.  Pt. Denies chest pain or SOB.  Lung sounds  Compliance with meds missed 3 days of morning doses and 6 days of noon and evening doses.  Pill box filled x 2 weeks  Refills needed Vyndamax  Meds changes since last visit  NONE    Social changes NONE   BP (!) 190/110 (BP Location: Left Arm, Patient Position: Sitting, Cuff Size: Normal)   Pulse 82   Resp 16   Wt 163 lb 12.8 oz (74.3 kg)   SpO2 96%   BMI 24.19 kg/m  Weight yesterday- not taken Last visit weight-160lb  Thomas West has been non-compliant with his meds.  He tells me he's had "a lot going on" and just hasn't taken his meds.  We discussed the consequences of not taking his meds.  He had not taken this mornings meds yet and was hypertensive.  He did take morning dose in my presence.  He denies chest pain or SOB.  He is going out of town with his girlfriend so 2 weeks worth of meds set up for him.    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   Pruritic rash 05/25/2022   Polyneuropathy associated with underlying disease (HCC) 01/15/2022   Atherosclerosis of aorta (HCC) 01/15/2022   History of CVA (cerebrovascular accident) without residual deficits 12/24/2021   AKI (acute kidney injury) (HCC) 12/13/2021   Diarrhea 12/13/2021   Shortness of breath 12/13/2021   Chest pain 12/13/2021   Nausea and vomiting 12/13/2021   Heart failure (HCC) 12/08/2021   Hypertension associated with diabetes  (HCC) 07/12/2021   Chronic kidney disease, stage 3a (HCC) 07/12/2021   Hyperkalemia 07/12/2021   CHF exacerbation (HCC) 04/20/2021   HFrEF (heart failure with reduced ejection fraction) (HCC) 02/03/2021   Diabetes mellitus (HCC) 03/27/2019   Hyperlipidemia associated with type 2 diabetes mellitus (HCC) 03/27/2019   CAD (coronary artery disease) 03/05/2019   History of MI (myocardial infarction) 03/05/2019   Type 2 diabetes mellitus with diabetic neuropathy, unspecified (HCC) 03/05/2019   HLD (hyperlipidemia) 03/05/2019   GERD (gastroesophageal reflux disease) 03/05/2019   Hypertension, essential, benign 03/05/2019    Current Outpatient Medications:    aspirin EC 81 MG tablet, Take 1 tablet (81 mg total) by mouth daily. Swallow whole., Disp: 30 tablet, Rfl: 12   atorvastatin (LIPITOR) 80 MG tablet, Take 1 tablet (80 mg total) by mouth daily., Disp: 90 tablet, Rfl: 3   carvedilol (COREG) 6.25 MG tablet, Take 1 tablet (6.25 mg total) by mouth 2 (two) times daily., Disp: 180 tablet, Rfl: 3   clopidogrel (PLAVIX) 75 MG tablet, Take 1 tablet (75 mg total) by mouth daily., Disp: 90 tablet, Rfl: 0   cyanocobalamin 1000 MCG tablet, TAKE ONE TABLET BY MOUTH DAILY, Disp: 30 tablet, Rfl: 1   DULoxetine (CYMBALTA) 30 MG capsule, Take 1 capsule (30 mg total) by mouth daily., Disp: 30 capsule, Rfl: 3   empagliflozin (JARDIANCE) 25 MG TABS tablet, Take 1 tablet (25  mg total) by mouth daily before breakfast., Disp: 90 tablet, Rfl: 1   ezetimibe (ZETIA) 10 MG tablet, Take 1 tablet (10 mg total) by mouth daily., Disp: 30 tablet, Rfl: 3   hydrALAZINE (APRESOLINE) 50 MG tablet, Take 1.5 tablets (75 mg total) by mouth at 9am, 1pm and 5 pm., Disp: 135 tablet, Rfl: 9   hydrocortisone cream 1 %, Apply 1 Application topically 2 (two) times daily as needed for itching., Disp: 45 g, Rfl: 1   isosorbide mononitrate (IMDUR) 30 MG 24 hr tablet, Take 1 tablet (30 mg total) by mouth daily., Disp: 30 tablet, Rfl: 11    linagliptin (TRADJENTA) 5 MG TABS tablet, Take 1 tablet (5 mg total) by mouth daily., Disp: 30 tablet, Rfl: 3   sacubitril-valsartan (ENTRESTO) 24-26 MG, Take 1 tablet by mouth 2 (two) times daily., Disp: 60 tablet, Rfl: 3   spironolactone (ALDACTONE) 25 MG tablet, Take 1 tablet (25 mg total) by mouth daily., Disp: 30 tablet, Rfl: 11   spironolactone (ALDACTONE) 25 MG tablet, Take 1 tablet (25 mg total) by mouth daily., Disp: 30 tablet, Rfl: 4   Tafamidis (VYNDAMAX) 61 MG CAPS, Take 1 capsule by mouth daily., Disp: 30 capsule, Rfl: 11   torsemide (DEMADEX) 20 MG tablet, Take 1 tablet (20 mg total) by mouth daily., Disp: 30 tablet, Rfl: 3   atorvastatin (LIPITOR) 80 MG tablet, Take 1 tablet (80 mg total) by mouth daily at 5 pm., Disp: 90 tablet, Rfl: 0   carvedilol (COREG) 6.25 MG tablet, Take 1 tablet (6.25 mg total) by mouth every 12 (twelve) hours at 10am and 10pm., Disp: 60 tablet, Rfl: 6   clopidogrel (PLAVIX) 75 MG tablet, Take 1 tablet (75 mg total) by mouth daily at 9am., Disp: 90 tablet, Rfl: 0   Continuous Glucose Receiver (DEXCOM G7 RECEIVER) DEVI, Use to check blood sugars (Patient not taking: Reported on 11/24/2022), Disp: 3 each, Rfl: 3   Continuous Glucose Sensor (DEXCOM G7 SENSOR) MISC, Use to check blood sugars as directed. Change every 10 days. (Patient not taking: Reported on 10/20/2022), Disp: 3 each, Rfl: 2   hydrALAZINE (APRESOLINE) 50 MG tablet, Take 1.5 tablets (75 mg total) by mouth 3 (three) times daily., Disp: 135 tablet, Rfl: 11   torsemide (DEMADEX) 20 MG tablet, Take 1 tablet (20 mg total) by mouth daily at 9am., Disp: 30 tablet, Rfl: 2 Allergies  Allergen Reactions   Bee Venom Anaphylaxis, Swelling and Other (See Comments)    Swells all over     Social History   Socioeconomic History   Marital status: Single    Spouse name: Not on file   Number of children: Not on file   Years of education: Not on file   Highest education level: Not on file  Occupational History    Not on file  Tobacco Use   Smoking status: Former    Types: Cigarettes    Quit date: 10/06/2017    Years since quitting: 5.1   Smokeless tobacco: Never  Substance and Sexual Activity   Alcohol use: Yes    Comment: Occasional Beer   Drug use: Yes    Types: Marijuana   Sexual activity: Not on file  Other Topics Concern   Not on file  Social History Narrative   Right Handed.    Lives in a two story home    Lives with 73 year old son.    Social Determinants of Health   Financial Resource Strain: Low Risk  (01/18/2022)  Overall Financial Resource Strain (CARDIA)    Difficulty of Paying Living Expenses: Not hard at all  Food Insecurity: No Food Insecurity (09/21/2022)   Hunger Vital Sign    Worried About Running Out of Food in the Last Year: Never true    Ran Out of Food in the Last Year: Never true  Transportation Needs: No Transportation Needs (06/04/2022)   PRAPARE - Administrator, Civil Service (Medical): No    Lack of Transportation (Non-Medical): No  Physical Activity: Inactive (01/18/2022)   Exercise Vital Sign    Days of Exercise per Week: 0 days    Minutes of Exercise per Session: 0 min  Stress: Stress Concern Present (01/18/2022)   [employer] of Occupational Health - Occupational Stress Questionnaire    Feeling of Stress : To some extent  Social Connections: Socially Isolated (01/18/2022)   Social Connection and Isolation Panel [NHANES]    Frequency of Communication with Friends and Family: More than three times a week    Frequency of Social Gatherings with Friends and Family: More than three times a week    Attends Religious Services: Never    Database administrator or Organizations: No    Attends Banker Meetings: Never    Marital Status: Never married  Intimate Partner Violence: Not At Risk (01/18/2022)   Humiliation, Afraid, Rape, and Kick questionnaire    Fear of Current or Ex-Partner: No    Emotionally Abused: No    Physically  Abused: No    Sexually Abused: No    Physical Exam      Future Appointments  Date Time Provider Department Center  12/06/2022 10:30 AM Nita Sickle K, DO [facility] None  02/02/2023 10:00 AM [facility] ECHO OP 1 [facility]  02/02/2023 11:00 AM Bensimhon, Bevelyn Buckles, MD [facility] None

## 2022-12-01 ENCOUNTER — Telehealth: Payer: Self-pay | Admitting: Family Medicine

## 2022-12-01 DIAGNOSIS — I2510 Atherosclerotic heart disease of native coronary artery without angina pectoris: Secondary | ICD-10-CM

## 2022-12-01 MED ORDER — ATORVASTATIN CALCIUM 80 MG PO TABS
80.0000 mg | ORAL_TABLET | Freq: Every day | ORAL | 3 refills | Status: DC
Start: 2022-12-01 — End: 2023-02-11

## 2022-12-01 NOTE — Telephone Encounter (Signed)
Prescription Request  12/01/2022  LOV: 09/24/2022  What is the name of the medication or equipment?  atorvastatin (LIPITOR) 80 MG tablet  Ryan with [facility] is requesting a 90 day supply of this Rx for this Pt.  Have you contacted your pharmacy to request a refill? Yes   Which pharmacy would you like this sent to?   [facility] [place] - [address]  [phone] [address]     Patient notified that their request is being sent to the clinical staff for review and that they should receive a response within 2 business days.   Please advise at Mobile [phone] (mobile)

## 2022-12-01 NOTE — Telephone Encounter (Signed)
Rx sent 

## 2022-12-02 ENCOUNTER — Other Ambulatory Visit (HOSPITAL_COMMUNITY): Payer: Self-pay | Admitting: Emergency Medicine

## 2022-12-02 NOTE — Progress Notes (Signed)
Home visit w/ med reconciliation only. Vyndamax delivered to home and I added to second week pill box for him as he is going out of town on Saturday.    Beatrix Shipper, EMT-Paramedic [phone] 12/02/2022

## 2022-12-06 ENCOUNTER — Ambulatory Visit: Payer: No Typology Code available for payment source | Admitting: Neurology

## 2022-12-14 ENCOUNTER — Ambulatory Visit: Payer: No Typology Code available for payment source | Admitting: Neurology

## 2022-12-14 ENCOUNTER — Encounter: Payer: Self-pay | Admitting: Neurology

## 2022-12-15 ENCOUNTER — Other Ambulatory Visit (HOSPITAL_COMMUNITY): Payer: Self-pay | Admitting: Emergency Medicine

## 2022-12-15 NOTE — Progress Notes (Signed)
Paramedicine Encounter    Patient ID: Thomas West, male    DOB: 1961/03/13, 62 y.o.   MRN: [id number]   Complaints NONE   Assessment A&O x 4, skin W&D w/ good color.  Lung sounds w/ expiratory wheezing bilaterally.  No peripheral edema noted.  Compliance with meds 100%  Pill box filled x 1 week  Refills needed ASA [id number] R  [facility]  Meds changes since last visit NONE    Social changes NONE   BP (!) 180/100 (BP Location: Left Arm, Patient Position: Sitting, Cuff Size: Normal)   Pulse 76   Resp 14   Wt 160 lb 6.4 oz (72.8 kg)   SpO2 96%   BMI 23.69 kg/m  Weight yesterday- not taken Last visit weight-163lb  Mr. Alcivar has been on vacation for the past week.  His blood pressure is very elevated today and he advised that while he did well to be compliant with his meds that he did not do well to make good nutritional choices.  Pt denies chest pain, SOB.  No headaches, visual disturbances and no obvious neurological deficits.  Med box reconciled x 1 week. Next home visit scheduled for 7/31 @ 9:00.  ACTION: Home visit completed  [name] [phone] 12/16/22  Patient Care Team: [name], [name], MD as PCP - General (Family Medicine) Bensimhon, Bevelyn Buckles, MD as PCP - Cardiology (Cardiology) Glendale Chard, DO as Consulting Physician (Neurology) Sherrill Raring, [facility] (Inactive) (Pharmacist)  Patient Active Problem List   Diagnosis Date Noted   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   Pruritic rash 05/25/2022   Polyneuropathy associated with underlying disease (HCC) 01/15/2022   Atherosclerosis of aorta (HCC) 01/15/2022   History of CVA (cerebrovascular accident) without residual deficits 12/24/2021   AKI (acute kidney injury) (HCC) 12/13/2021   Diarrhea 12/13/2021   Shortness of breath 12/13/2021   Chest pain 12/13/2021   Nausea and vomiting 12/13/2021   Heart failure (HCC) 12/08/2021   Hypertension associated with diabetes (HCC) 07/12/2021    Chronic kidney disease, stage 3a (HCC) 07/12/2021   Hyperkalemia 07/12/2021   CHF exacerbation (HCC) 04/20/2021   HFrEF (heart failure with reduced ejection fraction) (HCC) 02/03/2021   Diabetes mellitus (HCC) 03/27/2019   Hyperlipidemia associated with type 2 diabetes mellitus (HCC) 03/27/2019   CAD (coronary artery disease) 03/05/2019   History of MI (myocardial infarction) 03/05/2019   Type 2 diabetes mellitus with diabetic neuropathy, unspecified (HCC) 03/05/2019   HLD (hyperlipidemia) 03/05/2019   GERD (gastroesophageal reflux disease) 03/05/2019   Hypertension, essential, benign 03/05/2019    Current Outpatient Medications:    aspirin EC 81 MG tablet, Take 1 tablet (81 mg total) by mouth daily. Swallow whole., Disp: 30 tablet, Rfl: 12   atorvastatin (LIPITOR) 80 MG tablet, Take 1 tablet (80 mg total) by mouth daily., Disp: 90 tablet, Rfl: 3   carvedilol (COREG) 6.25 MG tablet, Take 1 tablet (6.25 mg total) by mouth every 12 (twelve) hours at 10am and 10pm., Disp: 60 tablet, Rfl: 6   clopidogrel (PLAVIX) 75 MG tablet, Take 1 tablet (75 mg total) by mouth daily at 9am., Disp: 90 tablet, Rfl: 0   Continuous Glucose Receiver (DEXCOM G7 RECEIVER) DEVI, Use to check blood sugars, Disp: 3 each, Rfl: 3   Continuous Glucose Sensor (DEXCOM G7 SENSOR) MISC, Use to check blood sugars as directed. Change every 10 days., Disp: 3 each, Rfl: 2   cyanocobalamin 1000 MCG tablet, TAKE ONE TABLET BY MOUTH DAILY, Disp: 30 tablet, Rfl: 1  DULoxetine (CYMBALTA) 30 MG capsule, Take 1 capsule (30 mg total) by mouth daily., Disp: 30 capsule, Rfl: 3   empagliflozin (JARDIANCE) 25 MG TABS tablet, Take 1 tablet (25 mg total) by mouth daily before breakfast., Disp: 90 tablet, Rfl: 1   ezetimibe (ZETIA) 10 MG tablet, Take 1 tablet (10 mg total) by mouth daily., Disp: 30 tablet, Rfl: 3   hydrALAZINE (APRESOLINE) 50 MG tablet, Take 1.5 tablets (75 mg total) by mouth 3 (three) times daily., Disp: 135 tablet, Rfl: 11    hydrocortisone cream 1 %, Apply 1 Application topically 2 (two) times daily as needed for itching., Disp: 45 g, Rfl: 1   isosorbide mononitrate (IMDUR) 30 MG 24 hr tablet, Take 1 tablet (30 mg total) by mouth daily., Disp: 30 tablet, Rfl: 11   linagliptin (TRADJENTA) 5 MG TABS tablet, Take 1 tablet (5 mg total) by mouth daily., Disp: 30 tablet, Rfl: 3   sacubitril-valsartan (ENTRESTO) 24-26 MG, Take 1 tablet by mouth 2 (two) times daily., Disp: 60 tablet, Rfl: 3   spironolactone (ALDACTONE) 25 MG tablet, Take 1 tablet (25 mg total) by mouth daily., Disp: 30 tablet, Rfl: 11   Tafamidis (VYNDAMAX) 61 MG CAPS, Take 1 capsule by mouth daily., Disp: 30 capsule, Rfl: 11   torsemide (DEMADEX) 20 MG tablet, Take 1 tablet (20 mg total) by mouth daily., Disp: 30 tablet, Rfl: 3   carvedilol (COREG) 6.25 MG tablet, Take 1 tablet (6.25 mg total) by mouth 2 (two) times daily. (Patient not taking: Reported on 12/15/2022), Disp: 180 tablet, Rfl: 3   clopidogrel (PLAVIX) 75 MG tablet, Take 1 tablet (75 mg total) by mouth daily. (Patient not taking: Reported on 12/15/2022), Disp: 90 tablet, Rfl: 0   hydrALAZINE (APRESOLINE) 50 MG tablet, Take 1.5 tablets (75 mg total) by mouth at 9am, 1pm and 5 pm. (Patient not taking: Reported on 12/15/2022), Disp: 135 tablet, Rfl: 9   spironolactone (ALDACTONE) 25 MG tablet, Take 1 tablet (25 mg total) by mouth daily. (Patient not taking: Reported on 12/15/2022), Disp: 30 tablet, Rfl: 4   torsemide (DEMADEX) 20 MG tablet, Take 1 tablet (20 mg total) by mouth daily at 9am. (Patient not taking: Reported on 12/15/2022), Disp: 30 tablet, Rfl: 2 Allergies  Allergen Reactions   Bee Venom Anaphylaxis, Swelling and Other (See Comments)    Swells all over     Social History   Socioeconomic History   Marital status: Single    Spouse name: Not on file   Number of children: Not on file   Years of education: Not on file   Highest education level: Not on file  Occupational History   Not  on file  Tobacco Use   Smoking status: Former    Current packs/day: 0.00    Types: Cigarettes    Quit date: 10/06/2017    Years since quitting: 5.1   Smokeless tobacco: Never  Substance and Sexual Activity   Alcohol use: Yes    Comment: Occasional Beer   Drug use: Yes    Types: Marijuana   Sexual activity: Not on file  Other Topics Concern   Not on file  Social History Narrative   Right Handed.    Lives in a two story home    Lives with 61 year old son.    Social Determinants of Health   Financial Resource Strain: Low Risk  (01/18/2022)   Overall Financial Resource Strain (CARDIA)    Difficulty of Paying Living Expenses: Not hard at all  Food Insecurity: No Food Insecurity (09/21/2022)   Hunger Vital Sign    Worried About Running Out of Food in the Last Year: Never true    Ran Out of Food in the Last Year: Never true  Transportation Needs: No Transportation Needs (06/04/2022)   PRAPARE - Administrator, Civil Service (Medical): No    Lack of Transportation (Non-Medical): No  Physical Activity: Inactive (01/18/2022)   Exercise Vital Sign    Days of Exercise per Week: 0 days    Minutes of Exercise per Session: 0 min  Stress: Stress Concern Present (01/18/2022)   [employer] of Occupational Health - Occupational Stress Questionnaire    Feeling of Stress : To some extent  Social Connections: Socially Isolated (01/18/2022)   Social Connection and Isolation Panel [NHANES]    Frequency of Communication with Friends and Family: More than three times a week    Frequency of Social Gatherings with Friends and Family: More than three times a week    Attends Religious Services: Never    Database administrator or Organizations: No    Attends Banker Meetings: Never    Marital Status: Never married  Intimate Partner Violence: Not At Risk (01/18/2022)   Humiliation, Afraid, Rape, and Kick questionnaire    Fear of Current or Ex-Partner: No    Emotionally  Abused: No    Physically Abused: No    Sexually Abused: No    Physical Exam      Future Appointments  Date Time Provider Department Center  02/02/2023 10:00 AM [facility] OP 1 [facility]  02/02/2023 11:00 AM Bensimhon, Bevelyn Buckles, MD [facility] None

## 2022-12-16 ENCOUNTER — Other Ambulatory Visit (HOSPITAL_COMMUNITY): Payer: Self-pay

## 2022-12-21 ENCOUNTER — Other Ambulatory Visit: Payer: Self-pay

## 2022-12-22 ENCOUNTER — Other Ambulatory Visit: Payer: Self-pay

## 2022-12-22 ENCOUNTER — Other Ambulatory Visit (HOSPITAL_COMMUNITY): Payer: Self-pay | Admitting: Emergency Medicine

## 2022-12-22 ENCOUNTER — Other Ambulatory Visit (HOSPITAL_COMMUNITY): Payer: Self-pay

## 2022-12-23 ENCOUNTER — Telehealth (HOSPITAL_COMMUNITY): Payer: Self-pay | Admitting: Emergency Medicine

## 2022-12-23 NOTE — Progress Notes (Signed)
Arrived at pt's home and he was not there.  Made several calls and unable to reach him.  LVM for him to call me back.    Beatrix Shipper, EMT-Paramedic [phone] 12/23/2022

## 2022-12-23 NOTE — Telephone Encounter (Signed)
[name] texted me to advise he would be out of town till next week.  He is good w/ his meds.  I will pick up his refills on 8/6 to deliver for home visit rescheduled for 8/7.  Beatrix Shipper, EMT-Paramedic [phone] 12/23/2022

## 2022-12-24 ENCOUNTER — Telehealth: Payer: Self-pay | Admitting: Family Medicine

## 2022-12-24 ENCOUNTER — Other Ambulatory Visit (HOSPITAL_COMMUNITY): Payer: Self-pay

## 2022-12-24 NOTE — Telephone Encounter (Signed)
Lee with [facility] called to say they are having a hard time contacting Pt.  Pt's phone # was confirmed.  [facility] shows Pt last received refill back in May 2024. [facility] is asking if we can call Pt to ask if or when he last picked up Rx, and does he need a refill.

## 2022-12-24 NOTE — Telephone Encounter (Signed)
Patient was called yesterday and he advised that he was good on his medicines & did not need refills.

## 2022-12-25 ENCOUNTER — Other Ambulatory Visit (HOSPITAL_COMMUNITY): Payer: Self-pay

## 2022-12-27 ENCOUNTER — Other Ambulatory Visit (HOSPITAL_COMMUNITY): Payer: Self-pay

## 2022-12-28 ENCOUNTER — Other Ambulatory Visit (HOSPITAL_COMMUNITY): Payer: Self-pay

## 2022-12-29 ENCOUNTER — Other Ambulatory Visit: Payer: Self-pay | Admitting: Family Medicine

## 2022-12-29 ENCOUNTER — Other Ambulatory Visit (HOSPITAL_COMMUNITY): Payer: Self-pay | Admitting: Emergency Medicine

## 2022-12-29 NOTE — Progress Notes (Signed)
Paramedicine Encounter    Patient ID: Thomas West, male    DOB: 03-Jun-1960, 62 y.o.   MRN: [id number]   Complaints NONE  Assessment A&O x 4, skin W&D w/ good color.  Some mild expiratory wheezing noted bilat. No edema.   Compliance with meds - Pill boxes are empty  Pill box filled x 2 weeks  Refills needed ASA, B12, Vyndamax  Meds changes since last visit none    Social changes: NONE ASA, B12 called in to be delivered  BP (!) 160/100 (BP Location: Left Arm, Patient Position: Sitting, Cuff Size: Normal)   Pulse 83   Wt 159 lb 3.2 oz (72.2 kg)   SpO2 97%   BMI 23.51 kg/m  Weight yesterday-not taken Last visit weight-160lb   ATF [name] A&O x 4, skin W&D w/ good color.  Pt. States that he is feeling well.  He has not taken his morning meds on my arrival.  I picked up his meds on Tuesday from [facility]. Cleda Daub, Tragenta, Zetia, Cymbalta, Torsemide, Ingenio)  Med box reconciled x 2 weeks.  Pt. [place]   Continued to impress on him the importance med compliance and nutritional choices.  He states he understands same.  ACTION: Home visit completed  [name] [phone] 12/29/22  Patient Care Team: [name], [name], MD as PCP - General (Family Medicine) Bensimhon, Bevelyn Buckles, MD as PCP - Cardiology (Cardiology) Glendale Chard, DO as Consulting Physician (Neurology) Sherrill Raring, [facility] (Inactive) (Pharmacist)  Patient Active Problem List   Diagnosis Date Noted   Cardiac amyloidosis [facility]) 05/25/2022   Pruritic rash 05/25/2022   Polyneuropathy associated with underlying disease (HCC) 01/15/2022   Atherosclerosis of aorta (HCC) 01/15/2022   History of CVA (cerebrovascular accident) without residual deficits 12/24/2021   AKI (acute kidney injury) (HCC) 12/13/2021   Diarrhea 12/13/2021   Shortness of breath 12/13/2021   Chest pain 12/13/2021   Nausea and vomiting  12/13/2021   Heart failure (HCC) 12/08/2021   Hypertension associated with diabetes (HCC) 07/12/2021   Chronic kidney disease, stage 3a (HCC) 07/12/2021   Hyperkalemia 07/12/2021   CHF exacerbation (HCC) 04/20/2021   HFrEF (heart failure with reduced ejection fraction) (HCC) 02/03/2021   Diabetes mellitus (HCC) 03/27/2019   Hyperlipidemia associated with type 2 diabetes mellitus (HCC) 03/27/2019   CAD (coronary artery disease) 03/05/2019   History of MI (myocardial infarction) 03/05/2019   Type 2 diabetes mellitus with diabetic neuropathy, unspecified (HCC) 03/05/2019   HLD (hyperlipidemia) 03/05/2019   GERD (gastroesophageal reflux disease) 03/05/2019   Hypertension, essential, benign 03/05/2019    Current Outpatient Medications:    aspirin EC 81 MG tablet, Take 1 tablet (81 mg total) by mouth daily. Swallow whole., Disp: 30 tablet, Rfl: 12   atorvastatin (LIPITOR) 80 MG tablet, Take 1 tablet (80 mg total) by mouth daily., Disp: 90 tablet, Rfl: 3   carvedilol (COREG) 6.25 MG tablet, Take 1 tablet (6.25 mg total) by mouth every 12 (twelve) hours at 10am and 10pm., Disp: 60 tablet, Rfl: 6   clopidogrel (PLAVIX) 75 MG tablet, Take 1 tablet (75 mg total) by mouth daily at 9am., Disp: 90 tablet, Rfl: 0   Continuous Glucose Sensor (DEXCOM G7 SENSOR) MISC, Use to check blood sugars as directed. Change every 10 days., Disp: 3 each, Rfl: 2   DULoxetine (CYMBALTA) 30 MG capsule, Take 1 capsule (30 mg total) by mouth  daily., Disp: 30 capsule, Rfl: 3   empagliflozin (JARDIANCE) 25 MG TABS tablet, Take 1 tablet (25 mg total) by mouth daily before breakfast., Disp: 90 tablet, Rfl: 1   ezetimibe (ZETIA) 10 MG tablet, Take 1 tablet (10 mg total) by mouth daily., Disp: 30 tablet, Rfl: 3   hydrALAZINE (APRESOLINE) 50 MG tablet, Take 1.5 tablets (75 mg total) by mouth 3 (three) times daily., Disp: 135 tablet, Rfl: 11   hydrocortisone cream 1 %, Apply 1 Application topically 2 (two) times daily as needed for  itching., Disp: 45 g, Rfl: 1   isosorbide mononitrate (IMDUR) 30 MG 24 hr tablet, Take 1 tablet (30 mg total) by mouth daily., Disp: 30 tablet, Rfl: 11   linagliptin (TRADJENTA) 5 MG TABS tablet, Take 1 tablet (5 mg total) by mouth daily., Disp: 30 tablet, Rfl: 3   sacubitril-valsartan (ENTRESTO) 24-26 MG, Take 1 tablet by mouth 2 (two) times daily., Disp: 60 tablet, Rfl: 3   spironolactone (ALDACTONE) 25 MG tablet, Take 1 tablet (25 mg total) by mouth daily., Disp: 30 tablet, Rfl: 11   Tafamidis (VYNDAMAX) 61 MG CAPS, Take 1 capsule by mouth daily., Disp: 30 capsule, Rfl: 11   torsemide (DEMADEX) 20 MG tablet, Take 1 tablet (20 mg total) by mouth daily., Disp: 30 tablet, Rfl: 3   carvedilol (COREG) 6.25 MG tablet, Take 1 tablet (6.25 mg total) by mouth 2 (two) times daily. (Patient not taking: Reported on 12/15/2022), Disp: 180 tablet, Rfl: 3   clopidogrel (PLAVIX) 75 MG tablet, Take 1 tablet (75 mg total) by mouth daily. (Patient not taking: Reported on 12/15/2022), Disp: 90 tablet, Rfl: 0   Continuous Glucose Receiver (DEXCOM G7 RECEIVER) DEVI, Use to check blood sugars, Disp: 3 each, Rfl: 3   cyanocobalamin (VITAMIN B12) 1000 MCG tablet, TAKE ONE TABLET BY MOUTH DAILY, Disp: 30 tablet, Rfl: 3   hydrALAZINE (APRESOLINE) 50 MG tablet, Take 1.5 tablets (75 mg total) by mouth at 9am, 1pm and 5 pm. (Patient not taking: Reported on 12/15/2022), Disp: 135 tablet, Rfl: 9   spironolactone (ALDACTONE) 25 MG tablet, Take 1 tablet (25 mg total) by mouth daily. (Patient not taking: Reported on 12/15/2022), Disp: 30 tablet, Rfl: 4   torsemide (DEMADEX) 20 MG tablet, Take 1 tablet (20 mg total) by mouth daily at 9am. (Patient not taking: Reported on 12/15/2022), Disp: 30 tablet, Rfl: 2 Allergies  Allergen Reactions   Bee Venom Anaphylaxis, Swelling and Other (See Comments)    Swells all over     Social History   Socioeconomic History   Marital status: Single    Spouse name: Not on file   Number of  children: Not on file   Years of education: Not on file   Highest education level: Not on file  Occupational History   Not on file  Tobacco Use   Smoking status: Former    Current packs/day: 0.00    Types: Cigarettes    Quit date: 10/06/2017    Years since quitting: 5.2   Smokeless tobacco: Never  Substance and Sexual Activity   Alcohol use: Yes    Comment: Occasional Beer   Drug use: Yes    Types: Marijuana   Sexual activity: Not on file  Other Topics Concern   Not on file  Social History Narrative   Right Handed.    Lives in a two story home    Lives with 56 year old son.    Social Determinants of Corporate investment banker  Strain: Low Risk  (01/18/2022)   Overall Financial Resource Strain (CARDIA)    Difficulty of Paying Living Expenses: Not hard at all  Food Insecurity: No Food Insecurity (09/21/2022)   Hunger Vital Sign    Worried About Running Out of Food in the Last Year: Never true    Ran Out of Food in the Last Year: Never true  Transportation Needs: No Transportation Needs (06/04/2022)   PRAPARE - Administrator, Civil Service (Medical): No    Lack of Transportation (Non-Medical): No  Physical Activity: Inactive (01/18/2022)   Exercise Vital Sign    Days of Exercise per Week: 0 days    Minutes of Exercise per Session: 0 min  Stress: Stress Concern Present (01/18/2022)   [employer] of Occupational Health - Occupational Stress Questionnaire    Feeling of Stress : To some extent  Social Connections: Socially Isolated (01/18/2022)   Social Connection and Isolation Panel [NHANES]    Frequency of Communication with Friends and Family: More than three times a week    Frequency of Social Gatherings with Friends and Family: More than three times a week    Attends Religious Services: Never    Database administrator or Organizations: No    Attends Banker Meetings: Never    Marital Status: Never married  Intimate Partner Violence: Not At  Risk (01/18/2022)   Humiliation, Afraid, Rape, and Kick questionnaire    Fear of Current or Ex-Partner: No    Emotionally Abused: No    Physically Abused: No    Sexually Abused: No    Physical Exam      Future Appointments  Date Time Provider Department Center  02/02/2023 10:00 AM [facility] OP 1 [facility]  02/02/2023 11:00 AM Bensimhon, Bevelyn Buckles, MD [facility] None

## 2022-12-30 ENCOUNTER — Other Ambulatory Visit (HOSPITAL_COMMUNITY): Payer: Self-pay

## 2023-01-04 ENCOUNTER — Other Ambulatory Visit (HOSPITAL_COMMUNITY): Payer: Self-pay

## 2023-01-07 ENCOUNTER — Other Ambulatory Visit (HOSPITAL_COMMUNITY): Payer: Self-pay

## 2023-01-07 ENCOUNTER — Telehealth: Payer: Self-pay | Admitting: Family Medicine

## 2023-01-07 NOTE — Telephone Encounter (Signed)
[facility] called to say they have been trying to reach the Pt regarding a possible Medication Non-Adherence Therapy Advisory for the Atorvastatin.

## 2023-01-07 NOTE — Telephone Encounter (Signed)
Calling stating prescription for atorvastatin (LIPITOR) 80 MG tablet has not been filled since May and they are concerned.

## 2023-01-07 NOTE — Telephone Encounter (Signed)
See previous phone encounter.

## 2023-01-07 NOTE — Telephone Encounter (Signed)
See other phone notes; rx was sent in July/2024.

## 2023-01-10 ENCOUNTER — Other Ambulatory Visit: Payer: Self-pay

## 2023-01-10 ENCOUNTER — Telehealth (HOSPITAL_COMMUNITY): Payer: Self-pay | Admitting: Pharmacy Technician

## 2023-01-10 ENCOUNTER — Other Ambulatory Visit (HOSPITAL_COMMUNITY): Payer: Self-pay

## 2023-01-10 NOTE — Telephone Encounter (Signed)
Patient Advocate Encounter   Received notification from [employer] that prior authorization for Vyndamax is required.   PA submitted on CoverMyMeds Key [id number] Status is pending   Will continue to follow.

## 2023-01-10 NOTE — Telephone Encounter (Signed)
Advanced Heart Failure Patient Advocate Encounter  Prior Authorization for Thomas West has been approved.    PA# [id number] Effective dates: 10/12/22 through 01/10/24  Co-pay $0. Updated claim in [place].  Archer Asa, CPhT

## 2023-01-11 ENCOUNTER — Other Ambulatory Visit: Payer: Self-pay

## 2023-01-11 ENCOUNTER — Telehealth (HOSPITAL_COMMUNITY): Payer: Self-pay | Admitting: Emergency Medicine

## 2023-01-11 NOTE — Telephone Encounter (Signed)
Called Mr. Sonner to confirm home visit and he advised he will be out of town until Friday 8/23.  He states he will call me to let me know he's home.  Will schedule time of visit at that time.    Beatrix Shipper, EMT-Paramedic [phone] 01/11/2023

## 2023-01-18 ENCOUNTER — Telehealth: Payer: Self-pay

## 2023-01-18 NOTE — Progress Notes (Signed)
   01/18/2023  Patient ID: Thomas West, male   DOB: 28-Apr-1961, 62 y.o.   MRN: [id number]  Outreach attempt to schedule telephone visit for medication review after patient was identified as being high risk for hospital admission.  I was not able to reach the patient, but a HIPAA compliant voicemail with my direct phone number was left for him to return my call.  Lenna Gilford, PharmD, DPLA

## 2023-01-19 ENCOUNTER — Other Ambulatory Visit (HOSPITAL_COMMUNITY): Payer: Self-pay

## 2023-01-19 ENCOUNTER — Telehealth (HOSPITAL_COMMUNITY): Payer: Self-pay | Admitting: Cardiology

## 2023-01-19 ENCOUNTER — Other Ambulatory Visit (HOSPITAL_COMMUNITY): Payer: Self-pay | Admitting: Internal Medicine

## 2023-01-19 ENCOUNTER — Other Ambulatory Visit (HOSPITAL_COMMUNITY): Payer: Self-pay | Admitting: Emergency Medicine

## 2023-01-19 DIAGNOSIS — I2510 Atherosclerotic heart disease of native coronary artery without angina pectoris: Secondary | ICD-10-CM

## 2023-01-19 NOTE — Telephone Encounter (Signed)
He has not had any recent labs so would not advise increasing Entresto or spiro until we have an updated BMP to look at SCr and K.   He can increase Hydralazine to 100 mg tid. Make sure he has close clinic f/u to help w/ further med titration.

## 2023-01-19 NOTE — Telephone Encounter (Signed)
-----   Message from CMA [name] S sent at 01/19/2023  2:03 PM EDT ----- Regarding: high blood pressures Follow up on message sent on 8/7 regarding [name]'s blood pressures. He continues to run high   7/9 190/110 7/24 180/100 8/7  160/100 8/28 160/90  He denies chest pain, no SOB.  No headaches or visual disturbances.  Compliant w/ medications.  Please advise any desired med changes.  Thanks again,    Beatrix Shipper, EMT-Paramedic [phone] 01/19/2023

## 2023-01-19 NOTE — Progress Notes (Unsigned)
Paramedicine Encounter    Patient ID: Thomas West, male    DOB: 08/01/1960, 62 y.o.   MRN: [id number]   Complaints NONE  Assessment A&O x 4, skin W&D w/ good color.  Lung sounds clear throughout.  No peripheral edema noted.  Compliance with meds YES  Pill box filled x 1 week  Refills needed ASA, B12  Meds changes since last visit NONE    Social changes NONE   BP (!) 160/90 (BP Location: Left Arm, Patient Position: Sitting, Cuff Size: Normal)   Pulse 85   Resp 14   Wt 160 lb 6.4 oz (72.8 kg)   SpO2 94%   BMI 23.69 kg/m  Weight yesterday-not taken Last visit weight-159llb  On my arrival today, Thomas West was all smiles.  He has just returned from vacation and says "I feel great".  Med box reconciled x 1 week.  Refills called into [facility] for ASA & B12 to be delivered tomorrow.  Advised pt to add 1 ASA daily and 1 B12 daily to his med box.  He verbalizes he understands same.   Pt's BP continues to run high.  He denies chest pain or SOB.   I will reach out to the [facility] regarding any desired med changes  and follow up with him when I hear back.   He states that he is happy that he has been able to stay out of the hospital.  I advised him that he needs to make sure he is diligent about making good nutritional choices, avoiding salt and watching his fluid intake. He has an upcoming clinic appointment 9/11 for an ECHO.  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   Pruritic rash 05/25/2022   Polyneuropathy associated with underlying disease (HCC) 01/15/2022   Atherosclerosis of aorta (HCC) 01/15/2022    History of CVA (cerebrovascular accident) without residual deficits 12/24/2021   AKI (acute kidney injury) (HCC) 12/13/2021   Diarrhea 12/13/2021   Shortness of breath 12/13/2021   Chest pain 12/13/2021   Nausea and vomiting 12/13/2021   Heart failure (HCC) 12/08/2021   Hypertension associated with diabetes (HCC) 07/12/2021   Chronic kidney disease, stage 3a (HCC) 07/12/2021   Hyperkalemia 07/12/2021   CHF exacerbation (HCC) 04/20/2021   HFrEF (heart failure with reduced ejection fraction) (HCC) 02/03/2021   Diabetes mellitus (HCC) 03/27/2019   Hyperlipidemia associated with type 2 diabetes mellitus (HCC) 03/27/2019   CAD (coronary artery disease) 03/05/2019   History of MI (myocardial infarction) 03/05/2019   Type 2 diabetes mellitus with diabetic neuropathy, unspecified (HCC) 03/05/2019   HLD (hyperlipidemia) 03/05/2019   GERD (gastroesophageal reflux disease) 03/05/2019   Hypertension, essential, benign 03/05/2019    Current Outpatient Medications:    aspirin EC 81 MG tablet, Take 1 tablet (81 mg total) by mouth daily. Swallow whole., Disp: 30 tablet, Rfl: 12   atorvastatin (LIPITOR) 80 MG tablet, Take 1 tablet (80 mg total) by mouth daily., Disp: 90 tablet, Rfl: 3   carvedilol (COREG) 6.25 MG tablet, Take 1 tablet (6.25 mg total) by mouth every 12 (twelve) hours at 10am and 10pm., Disp: 60 tablet, Rfl: 6   clopidogrel (PLAVIX) 75 MG  tablet, Take 1 tablet (75 mg total) by mouth daily at 9am., Disp: 90 tablet, Rfl: 0   Continuous Glucose Receiver (DEXCOM G7 RECEIVER) DEVI, Use to check blood sugars, Disp: 3 each, Rfl: 3   cyanocobalamin (VITAMIN B12) 1000 MCG tablet, TAKE ONE TABLET BY MOUTH DAILY, Disp: 30 tablet, Rfl: 3   DULoxetine (CYMBALTA) 30 MG capsule, Take 1 capsule (30 mg total) by mouth daily., Disp: 30 capsule, Rfl: 3   empagliflozin (JARDIANCE) 25 MG TABS tablet, Take 1 tablet (25 mg total) by mouth daily before breakfast., Disp: 90 tablet, Rfl: 1   ezetimibe (ZETIA) 10  MG tablet, Take 1 tablet (10 mg total) by mouth daily., Disp: 30 tablet, Rfl: 3   hydrALAZINE (APRESOLINE) 50 MG tablet, Take 1.5 tablets (75 mg total) by mouth 3 (three) times daily., Disp: 135 tablet, Rfl: 11   hydrocortisone cream 1 %, Apply 1 Application topically 2 (two) times daily as needed for itching., Disp: 45 g, Rfl: 1   isosorbide mononitrate (IMDUR) 30 MG 24 hr tablet, Take 1 tablet (30 mg total) by mouth daily., Disp: 30 tablet, Rfl: 11   linagliptin (TRADJENTA) 5 MG TABS tablet, Take 1 tablet (5 mg total) by mouth daily., Disp: 30 tablet, Rfl: 3   sacubitril-valsartan (ENTRESTO) 24-26 MG, Take 1 tablet by mouth 2 (two) times daily., Disp: 60 tablet, Rfl: 3   spironolactone (ALDACTONE) 25 MG tablet, Take 1 tablet (25 mg total) by mouth daily., Disp: 30 tablet, Rfl: 11   Tafamidis (VYNDAMAX) 61 MG CAPS, Take 1 capsule by mouth daily., Disp: 30 capsule, Rfl: 11   torsemide (DEMADEX) 20 MG tablet, Take 1 tablet (20 mg total) by mouth daily., Disp: 30 tablet, Rfl: 3   carvedilol (COREG) 6.25 MG tablet, Take 1 tablet (6.25 mg total) by mouth 2 (two) times daily. (Patient not taking: Reported on 01/19/2023), Disp: 180 tablet, Rfl: 3   clopidogrel (PLAVIX) 75 MG tablet, Take 1 tablet (75 mg total) by mouth daily. (Patient not taking: Reported on 12/15/2022), Disp: 90 tablet, Rfl: 0   Continuous Glucose Sensor (DEXCOM G7 SENSOR) MISC, Use to check blood sugars as directed. Change every 10 days. (Patient not taking: Reported on 01/19/2023), Disp: 3 each, Rfl: 2   hydrALAZINE (APRESOLINE) 50 MG tablet, Take 1.5 tablets (75 mg total) by mouth at 9am, 1pm and 5 pm. (Patient not taking: Reported on 12/15/2022), Disp: 135 tablet, Rfl: 9   spironolactone (ALDACTONE) 25 MG tablet, Take 1 tablet (25 mg total) by mouth daily. (Patient not taking: Reported on 12/15/2022), Disp: 30 tablet, Rfl: 4   torsemide (DEMADEX) 20 MG tablet, Take 1 tablet (20 mg total) by mouth daily at 9am. (Patient not taking: Reported  on 12/15/2022), Disp: 30 tablet, Rfl: 2 Allergies  Allergen Reactions   Bee Venom Anaphylaxis, Swelling and Other (See Comments)    Swells all over     Social History   Socioeconomic History   Marital status: Single    Spouse name: Not on file   Number of children: Not on file   Years of education: Not on file   Highest education level: Not on file  Occupational History   Not on file  Tobacco Use   Smoking status: Former    Current packs/day: 0.00    Types: Cigarettes    Quit date: 10/06/2017    Years since quitting: 5.2   Smokeless tobacco: Never  Substance and Sexual Activity   Alcohol use: Yes    Comment: Occasional Beer  Drug use: Yes    Types: Marijuana   Sexual activity: Not on file  Other Topics Concern   Not on file  Social History Narrative   Right Handed.    Lives in a two story home    Lives with 58 year old son.    Social Determinants of Health   Financial Resource Strain: Low Risk  (01/18/2022)   Overall Financial Resource Strain (CARDIA)    Difficulty of Paying Living Expenses: Not hard at all  Food Insecurity: No Food Insecurity (09/21/2022)   Hunger Vital Sign    Worried About Running Out of Food in the Last Year: Never true    Ran Out of Food in the Last Year: Never true  Transportation Needs: No Transportation Needs (06/04/2022)   PRAPARE - Administrator, Civil Service (Medical): No    Lack of Transportation (Non-Medical): No  Physical Activity: Inactive (01/18/2022)   Exercise Vital Sign    Days of Exercise per Week: 0 days    Minutes of Exercise per Session: 0 min  Stress: Stress Concern Present (01/18/2022)   [employer] of Occupational Health - Occupational Stress Questionnaire    Feeling of Stress : To some extent  Social Connections: Socially Isolated (01/18/2022)   Social Connection and Isolation Panel [NHANES]    Frequency of Communication with Friends and Family: More than three times a week    Frequency of Social  Gatherings with Friends and Family: More than three times a week    Attends Religious Services: Never    Database administrator or Organizations: No    Attends Banker Meetings: Never    Marital Status: Never married  Intimate Partner Violence: Not At Risk (01/18/2022)   Humiliation, Afraid, Rape, and Kick questionnaire    Fear of Current or Ex-Partner: No    Emotionally Abused: No    Physically Abused: No    Sexually Abused: No    Physical Exam      Future Appointments  Date Time Provider Department Center  02/02/2023 10:00 AM [facility] ECHO OP 1 [facility]  02/02/2023 11:00 AM Bensimhon, Bevelyn Buckles, MD [facility] None

## 2023-01-19 NOTE — Telephone Encounter (Signed)
Please advise 

## 2023-01-20 ENCOUNTER — Other Ambulatory Visit: Payer: Self-pay

## 2023-01-20 ENCOUNTER — Other Ambulatory Visit (HOSPITAL_COMMUNITY): Payer: Self-pay

## 2023-01-20 ENCOUNTER — Telehealth (HOSPITAL_COMMUNITY): Payer: Self-pay | Admitting: Cardiology

## 2023-01-20 NOTE — Telephone Encounter (Signed)
-----   Message from CMA [name] S sent at 01/19/2023  2:03 PM EDT ----- Regarding: high blood pressures Follow up on message sent on 8/7 regarding Thomas West's blood pressures. He continues to run high   7/9 190/110 7/24 180/100 8/7  160/100 8/28 160/90  He denies chest pain, no SOB.  No headaches or visual disturbances.  Compliant w/ medications.  Please advise any desired med changes.  Thanks again,    Beatrix Shipper, EMT-Paramedic [phone] 01/19/2023

## 2023-01-20 NOTE — Telephone Encounter (Signed)
Thomas West with para medicine aware

## 2023-01-21 ENCOUNTER — Other Ambulatory Visit (HOSPITAL_COMMUNITY): Payer: Self-pay

## 2023-01-21 ENCOUNTER — Telehealth (HOSPITAL_COMMUNITY): Payer: Self-pay | Admitting: Emergency Medicine

## 2023-01-21 MED ORDER — ASPIRIN 81 MG PO TBEC: 81.0000 mg | DELAYED_RELEASE_TABLET | Freq: Every day | ORAL | 11 refills | Status: AC

## 2023-01-21 NOTE — Telephone Encounter (Addendum)
Thomas West scheduled for labs on 9/3 at 3:45 he was made aware of same by [name]- paramedicine.   Maralyn Sago, EMT-Paramedic [phone] 01/21/2023

## 2023-01-21 NOTE — Telephone Encounter (Signed)
Called @ 12:42 and LVM regarding Lab appointment scheduled for 9/4 @ 15:45.    Beatrix Shipper, EMT-Paramedic [phone] 01/21/2023

## 2023-01-25 ENCOUNTER — Other Ambulatory Visit (HOSPITAL_COMMUNITY): Payer: No Typology Code available for payment source

## 2023-01-26 ENCOUNTER — Other Ambulatory Visit (HOSPITAL_COMMUNITY): Payer: Self-pay | Admitting: Emergency Medicine

## 2023-01-26 ENCOUNTER — Ambulatory Visit (HOSPITAL_COMMUNITY)
Admission: RE | Admit: 2023-01-26 | Discharge: 2023-01-26 | Disposition: A | Discharge status: Home / Self Care | Payer: No Typology Code available for payment source | Source: Ambulatory Visit | Attending: Cardiology | Admitting: Cardiology

## 2023-01-26 DIAGNOSIS — I10 Essential (primary) hypertension: Secondary | ICD-10-CM | POA: Diagnosis not present

## 2023-01-26 LAB — BASIC METABOLIC PANEL
Anion gap: 11 (ref 5–15)
BUN: 26 mg/dL — ABNORMAL HIGH (ref 6–20)
CO2: 23 mmol/L (ref 22–32)
Calcium: 8.3 mg/dL — ABNORMAL LOW (ref 8.9–10.3)
Chloride: 103 mmol/L (ref 98–111)
Creatinine, Ser: 2.29 mg/dL — ABNORMAL HIGH (ref 0.61–1.24)
GFR, Estimated: 32 mL/min — ABNORMAL LOW (ref >60)
Glucose, Bld: 207 mg/dL — ABNORMAL HIGH (ref 70–99)
Potassium: 3.9 mmol/L (ref 3.5–5.1)
Sodium: 137 mmol/L (ref 135–145)

## 2023-01-26 NOTE — Progress Notes (Signed)
Paramedicine Encounter    Patient ID: Thomas West, male    DOB: 07-Nov-1960, 62 y.o.   MRN: [id number]   Complaints NONE  Assessment A&O x 4, skin W&D w/ good color.  Lung sounds clear throughout and no peripheral edema noted.  Compliance with meds yes  Pill box filled x 1 week  Refills needed Carvedilol, Isosorbide, Entresto  Meds changes since last visit Hydralazine 100mg . TID    Social changes none   BP (!) 150/98 (BP Location: Left Arm, Patient Position: Sitting, Cuff Size: Normal)   Pulse 85   Resp 16   Wt 160 lb 6.4 oz (72.8 kg)   SpO2 94%   BMI 23.69 kg/m  Weight yesterday-not taken Last visit weight-160lb  Original appointment for today was @ 8:30 and he called to reschedule it for later today.  He had labwork scheduled for 3:45 so I advised him to call me when he gets home and I will come do a med rec.  Med box filled x 1 week.  I provided him with a typed out list of his current medications and doses and reviewed with him and his son.  His son demonstrates a strong grasp of the med sheet and was able to read it back to me and tell me what needed to be done.  Hopefully this will be beneficial in preparing Thomas West to be able to handle reconciling his own medications. Lab results had not yet been documented.  I advised him that there may be some changes coming and I will reach out to him tomorrow and let him know.  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   Pruritic rash 05/25/2022   Polyneuropathy associated with underlying disease (HCC) 01/15/2022   Atherosclerosis of aorta (HCC) 01/15/2022    History of CVA (cerebrovascular accident) without residual deficits 12/24/2021   AKI (acute kidney injury) (HCC) 12/13/2021   Diarrhea 12/13/2021   Shortness of breath 12/13/2021   Chest pain 12/13/2021   Nausea and vomiting 12/13/2021   Heart failure (HCC) 12/08/2021   Hypertension associated with diabetes (HCC) 07/12/2021   Chronic kidney disease, stage 3a (HCC) 07/12/2021   Hyperkalemia 07/12/2021   CHF exacerbation (HCC) 04/20/2021   HFrEF (heart failure with reduced ejection fraction) (HCC) 02/03/2021   Diabetes mellitus (HCC) 03/27/2019   Hyperlipidemia associated with type 2 diabetes mellitus (HCC) 03/27/2019   CAD (coronary artery disease) 03/05/2019   History of MI (myocardial infarction) 03/05/2019   Type 2 diabetes mellitus with diabetic neuropathy, unspecified (HCC) 03/05/2019   HLD (hyperlipidemia) 03/05/2019   GERD (gastroesophageal reflux disease) 03/05/2019   Hypertension, essential, benign 03/05/2019    Current Outpatient Medications:    aspirin EC 81 MG tablet, Take 1 tablet (81 mg total) by mouth daily. Swallow whole., Disp: 30 tablet, Rfl: 11   atorvastatin (LIPITOR) 80 MG tablet, Take 1 tablet (80 mg total) by mouth daily., Disp: 90 tablet, Rfl: 3   carvedilol (COREG) 6.25 MG tablet, Take 1 tablet (6.25 mg total) by mouth every 12 (twelve) hours at 10am and 10pm., Disp: 60 tablet, Rfl: 6   clopidogrel (PLAVIX) 75 MG tablet, Take 1 tablet (75 mg total) by  mouth daily at 9am., Disp: 90 tablet, Rfl: 0   cyanocobalamin (VITAMIN B12) 1000 MCG tablet, TAKE ONE TABLET BY MOUTH DAILY, Disp: 30 tablet, Rfl: 3   DULoxetine (CYMBALTA) 30 MG capsule, Take 1 capsule (30 mg total) by mouth daily., Disp: 30 capsule, Rfl: 3   empagliflozin (JARDIANCE) 25 MG TABS tablet, Take 1 tablet (25 mg total) by mouth daily before breakfast., Disp: 90 tablet, Rfl: 1   ezetimibe (ZETIA) 10 MG tablet, Take 1 tablet (10 mg total) by mouth daily., Disp: 30 tablet, Rfl: 3   hydrALAZINE  (APRESOLINE) 50 MG tablet, Take 1.5 tablets (75 mg total) by mouth 3 (three) times daily., Disp: 135 tablet, Rfl: 11   hydrocortisone cream 1 %, Apply 1 Application topically 2 (two) times daily as needed for itching., Disp: 45 West, Rfl: 1   isosorbide mononitrate (IMDUR) 30 MG 24 hr tablet, Take 1 tablet (30 mg total) by mouth daily., Disp: 30 tablet, Rfl: 11   linagliptin (TRADJENTA) 5 MG TABS tablet, Take 1 tablet (5 mg total) by mouth daily., Disp: 30 tablet, Rfl: 3   sacubitril-valsartan (ENTRESTO) 24-26 MG, Take 1 tablet by mouth 2 (two) times daily., Disp: 60 tablet, Rfl: 3   spironolactone (ALDACTONE) 25 MG tablet, Take 1 tablet (25 mg total) by mouth daily., Disp: 30 tablet, Rfl: 11   Tafamidis (VYNDAMAX) 61 MG CAPS, Take 1 capsule by mouth daily., Disp: 30 capsule, Rfl: 11   torsemide (DEMADEX) 20 MG tablet, Take 1 tablet (20 mg total) by mouth daily., Disp: 30 tablet, Rfl: 3   carvedilol (COREG) 6.25 MG tablet, Take 1 tablet (6.25 mg total) by mouth 2 (two) times daily. (Patient not taking: Reported on 01/26/2023), Disp: 180 tablet, Rfl: 3   clopidogrel (PLAVIX) 75 MG tablet, Take 1 tablet (75 mg total) by mouth daily. (Patient not taking: Reported on 12/15/2022), Disp: 90 tablet, Rfl: 0   Continuous Glucose Receiver (DEXCOM G7 RECEIVER) DEVI, Use to check blood sugars (Patient not taking: Reported on 01/26/2023), Disp: 3 each, Rfl: 3   Continuous Glucose Sensor (DEXCOM G7 SENSOR) MISC, Use to check blood sugars as directed. Change every 10 days. (Patient not taking: Reported on 01/19/2023), Disp: 3 each, Rfl: 2   hydrALAZINE (APRESOLINE) 50 MG tablet, Take 1.5 tablets (75 mg total) by mouth at 9am, 1pm and 5 pm. (Patient not taking: Reported on 12/15/2022), Disp: 135 tablet, Rfl: 9   spironolactone (ALDACTONE) 25 MG tablet, Take 1 tablet (25 mg total) by mouth daily. (Patient not taking: Reported on 12/15/2022), Disp: 30 tablet, Rfl: 4   torsemide (DEMADEX) 20 MG tablet, Take 1 tablet (20 mg total)  by mouth daily at 9am. (Patient not taking: Reported on 12/15/2022), Disp: 30 tablet, Rfl: 2 Allergies  Allergen Reactions   Bee Venom Anaphylaxis, Swelling and Other (See Comments)    Swells all over     Social History   Socioeconomic History   Marital status: Single    Spouse name: Not on file   Number of children: Not on file   Years of education: Not on file   Highest education level: Not on file  Occupational History   Not on file  Tobacco Use   Smoking status: Former    Current packs/day: 0.00    Types: Cigarettes    Quit date: 10/06/2017    Years since quitting: 5.3   Smokeless tobacco: Never  Substance and Sexual Activity   Alcohol use: Yes    Comment: Occasional Beer  Drug use: Yes    Types: Marijuana   Sexual activity: Not on file  Other Topics Concern   Not on file  Social History Narrative   Right Handed.    Lives in a two story home    Lives with 61 year old son.    Social Determinants of Health   Financial Resource Strain: Low Risk  (01/18/2022)   Overall Financial Resource Strain (CARDIA)    Difficulty of Paying Living Expenses: Not hard at all  Food Insecurity: No Food Insecurity (09/21/2022)   Hunger Vital Sign    Worried About Running Out of Food in the Last Year: Never true    Ran Out of Food in the Last Year: Never true  Transportation Needs: No Transportation Needs (06/04/2022)   PRAPARE - Administrator, Civil Service (Medical): No    Lack of Transportation (Non-Medical): No  Physical Activity: Inactive (01/18/2022)   Exercise Vital Sign    Days of Exercise per Week: 0 days    Minutes of Exercise per Session: 0 min  Stress: Stress Concern Present (01/18/2022)   [employer] of Occupational Health - Occupational Stress Questionnaire    Feeling of Stress : To some extent  Social Connections: Socially Isolated (01/18/2022)   Social Connection and Isolation Panel [NHANES]    Frequency of Communication with Friends and Family: More  than three times a week    Frequency of Social Gatherings with Friends and Family: More than three times a week    Attends Religious Services: Never    Database administrator or Organizations: No    Attends Banker Meetings: Never    Marital Status: Never married  Intimate Partner Violence: Not At Risk (01/18/2022)   Humiliation, Afraid, Rape, and Kick questionnaire    Fear of Current or Ex-Partner: No    Emotionally Abused: No    Physically Abused: No    Sexually Abused: No    Physical Exam      Future Appointments  Date Time Provider Department [facility]  02/02/2023 10:00 AM [facility] ECHO OP 1 [facility]  02/02/2023 11:00 AM Bensimhon, Thomas Buckles, MD [facility] None

## 2023-01-27 ENCOUNTER — Other Ambulatory Visit (HOSPITAL_COMMUNITY): Payer: Self-pay | Admitting: Emergency Medicine

## 2023-01-27 ENCOUNTER — Other Ambulatory Visit: Payer: Self-pay

## 2023-01-27 ENCOUNTER — Telehealth (HOSPITAL_COMMUNITY): Payer: Self-pay

## 2023-01-27 ENCOUNTER — Other Ambulatory Visit (HOSPITAL_COMMUNITY): Payer: Self-pay

## 2023-01-27 NOTE — Telephone Encounter (Signed)
Called in the following refills for [name] at [facility]:  -Carvedilol -Isosorbide -Entresto   -Pharmacy indicates he has 11 total meds ready with no copay. I made [name] aware of same.   Maralyn Sago, EMT-Paramedic [phone] 01/27/2023

## 2023-01-27 NOTE — Progress Notes (Signed)
Med changes per [facility]-  reconciliation only today.   Removed Entresto 24-26, Spironolacton 25mg . & Toremide 20mg . X 3 days. (Thurs, Fri, Sat) Added Sherryll Burger and Cleda Daub back Sunday/ Added back Torsemide 10mg . Mon/Wed/Frid and 20mg  all other days.   Repeat BMET labs scheduled for 02/02/23.  He  has an ECHO @ 10:00 and Dr. Gala Romney @ 11:00.  Pt aware of same.    Beatrix Shipper, EMT-Paramedic [phone] 01/27/2023

## 2023-01-28 ENCOUNTER — Other Ambulatory Visit (HOSPITAL_COMMUNITY): Payer: Self-pay

## 2023-01-31 ENCOUNTER — Telehealth (HOSPITAL_COMMUNITY): Payer: Self-pay

## 2023-01-31 ENCOUNTER — Other Ambulatory Visit (HOSPITAL_COMMUNITY): Payer: Self-pay

## 2023-01-31 NOTE — Telephone Encounter (Signed)
[facility] reached out at this morning requesting [name] reach out to them in reference to refilling one of his specialty meds in which she assists with. I will make her aware to contact them to discuss with pharmacist about same.   Maralyn Sago, EMT-Paramedic [phone] 01/31/2023

## 2023-02-01 ENCOUNTER — Telehealth (HOSPITAL_COMMUNITY): Payer: Self-pay | Admitting: Emergency Medicine

## 2023-02-01 ENCOUNTER — Other Ambulatory Visit (HOSPITAL_COMMUNITY): Payer: Self-pay

## 2023-02-01 ENCOUNTER — Other Ambulatory Visit: Payer: Self-pay

## 2023-02-01 NOTE — Telephone Encounter (Signed)
Called and spoke with the pharmacists regarding Mr. Hungate's Vyndamax prescription.  Pharmacy just wanted to clarify he is taking his meds and confirm refill needs. Next refill will be mailed out to pt on Friday.    Beatrix Shipper, EMT-Paramedic [phone] 02/01/2023

## 2023-02-01 NOTE — Telephone Encounter (Signed)
Spoke with Mr. Pressnall to remind him of his ECHO appointment in the morning at [facility] @ 10:00.  Reminded him to bring his medications bottles and pill box for med rec.  He advised he would do same.    Beatrix Shipper, EMT-Paramedic [phone] 02/01/2023

## 2023-02-02 ENCOUNTER — Other Ambulatory Visit (HOSPITAL_COMMUNITY): Payer: Self-pay

## 2023-02-02 ENCOUNTER — Ambulatory Visit (HOSPITAL_COMMUNITY)
Admission: RE | Admit: 2023-02-02 | Discharge: 2023-02-02 | Disposition: A | Discharge status: Home / Self Care | Payer: No Typology Code available for payment source | Source: Ambulatory Visit | Attending: Internal Medicine | Admitting: Internal Medicine

## 2023-02-02 ENCOUNTER — Other Ambulatory Visit (HOSPITAL_COMMUNITY): Payer: Self-pay | Admitting: Emergency Medicine

## 2023-02-02 ENCOUNTER — Encounter (HOSPITAL_COMMUNITY): Payer: Self-pay | Admitting: Internal Medicine

## 2023-02-02 VITALS — BP 180/110 | HR 79 | Wt 167.0 lb

## 2023-02-02 DIAGNOSIS — I10 Essential (primary) hypertension: Secondary | ICD-10-CM

## 2023-02-02 DIAGNOSIS — I5023 Acute on chronic systolic (congestive) heart failure: Secondary | ICD-10-CM | POA: Diagnosis present

## 2023-02-02 DIAGNOSIS — I252 Old myocardial infarction: Secondary | ICD-10-CM | POA: Insufficient documentation

## 2023-02-02 DIAGNOSIS — Z955 Presence of coronary angioplasty implant and graft: Secondary | ICD-10-CM | POA: Diagnosis not present

## 2023-02-02 DIAGNOSIS — E1122 Type 2 diabetes mellitus with diabetic chronic kidney disease: Secondary | ICD-10-CM | POA: Insufficient documentation

## 2023-02-02 DIAGNOSIS — N1832 Chronic kidney disease, stage 3b: Secondary | ICD-10-CM | POA: Diagnosis not present

## 2023-02-02 DIAGNOSIS — I43 Cardiomyopathy in diseases classified elsewhere: Secondary | ICD-10-CM | POA: Diagnosis not present

## 2023-02-02 DIAGNOSIS — I5020 Unspecified systolic (congestive) heart failure: Secondary | ICD-10-CM | POA: Diagnosis not present

## 2023-02-02 DIAGNOSIS — I2510 Atherosclerotic heart disease of native coronary artery without angina pectoris: Secondary | ICD-10-CM | POA: Diagnosis not present

## 2023-02-02 DIAGNOSIS — E854 Organ-limited amyloidosis: Secondary | ICD-10-CM | POA: Insufficient documentation

## 2023-02-02 DIAGNOSIS — Z5982 Transportation insecurity: Secondary | ICD-10-CM | POA: Insufficient documentation

## 2023-02-02 DIAGNOSIS — I5022 Chronic systolic (congestive) heart failure: Secondary | ICD-10-CM | POA: Diagnosis not present

## 2023-02-02 DIAGNOSIS — I130 Hypertensive heart and chronic kidney disease with heart failure and stage 1 through stage 4 chronic kidney disease, or unspecified chronic kidney disease: Secondary | ICD-10-CM | POA: Insufficient documentation

## 2023-02-02 DIAGNOSIS — Z9181 History of falling: Secondary | ICD-10-CM | POA: Insufficient documentation

## 2023-02-02 DIAGNOSIS — Z8673 Personal history of transient ischemic attack (TIA), and cerebral infarction without residual deficits: Secondary | ICD-10-CM | POA: Insufficient documentation

## 2023-02-02 DIAGNOSIS — E1165 Type 2 diabetes mellitus with hyperglycemia: Secondary | ICD-10-CM | POA: Diagnosis not present

## 2023-02-02 DIAGNOSIS — G629 Polyneuropathy, unspecified: Secondary | ICD-10-CM

## 2023-02-02 LAB — ECHOCARDIOGRAM COMPLETE
Area-P 1/2: 2.55 cm2
S' Lateral: 4.1 cm

## 2023-02-02 LAB — BASIC METABOLIC PANEL
Anion gap: 6 (ref 5–15)
BUN: 14 mg/dL (ref 6–20)
CO2: 27 mmol/L (ref 22–32)
Calcium: 8.8 mg/dL — ABNORMAL LOW (ref 8.9–10.3)
Chloride: 107 mmol/L (ref 98–111)
Creatinine, Ser: 1.27 mg/dL — ABNORMAL HIGH (ref 0.61–1.24)
GFR, Estimated: >60 mL/min (ref >60)
Glucose, Bld: 90 mg/dL (ref 70–99)
Potassium: 5 mmol/L (ref 3.5–5.1)
Sodium: 140 mmol/L (ref 135–145)

## 2023-02-02 LAB — BRAIN NATRIURETIC PEPTIDE: B Natriuretic Peptide: 528.4 pg/mL — ABNORMAL HIGH (ref 0.0–100.0)

## 2023-02-02 MED ORDER — ENTRESTO 97-103 MG PO TABS
1.0000 | ORAL_TABLET | Freq: Two times a day (BID) | ORAL | 3 refills | Status: DC
  Filled 2023-02-02: qty 60, 30d supply, fill #0
  Filled 2023-02-25: qty 60, 30d supply, fill #1
  Filled 2023-03-28: qty 60, 30d supply, fill #2
  Filled 2023-04-26: qty 60, 30d supply, fill #3

## 2023-02-02 NOTE — Progress Notes (Signed)
Paramedicine Encounter   Patient ID: Thomas West , male,   DOB: 07/07/1960,61 y.o.,  MRN: [id number]   Met patient in clinic today with provider. Pt advised his EF had improved to 45%.  Encouraged to  keep up the good work.  Provider did increased Entresto to 97-103  Med box reconciled x 1 week.  New prescription for Entresto to be called in to [facility] and will be delivered.  XLKGMW@ clinic- 167lb B/P- 180/110 P-70 [id number]   Med changes today (if any) : Increased Entresto 97-103mg .    Beatrix Shipper, EMT-Paramedic [phone] 02/02/2023

## 2023-02-02 NOTE — Progress Notes (Signed)
Advanced Heart Failure Clinic Note   Primary Care: Dr. Betty [name] HF Cardiologist: Dr. Gala Romney  HPI: Thomas West is a 62 y.o. male with history of CAD with prior MI and stent in 01/2019 at [facility] in [place] (report not available), cardiomyopathy/chronic systolic HF, uncontrolled DM II, HTN, hx CVA on chart review, CKD.    Moved to [place] from [place], [place] 2 years ago.   Had not medical f/u until he was admitted in 10/22 with a/c systolic HF. Had been out of cardiac medications. Diuresed with IV lasix then transitioned to po lasix 40 mg daily. Started on GDMT with carvedilol, hydralazine, imdur and empagliflozin.    Echo 10/22 with EF 20-25%, RV okay, trivial MR   [facility] 10/22 with nonobstructive CAD and patent RPLV stent. RA mean 5 mmHg, PCWP mean 5 mmHg, Fick CO 7.46/CI 3.85.   He did not show up for [facility] appointment after discharge.   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  Admitted 12/22 with a/c CHF due to noncompliance with GDMT. cMRI strongly suggestive of amyloidosis, GDMT titrated. Admitted 2/23 with a/c CHF after running out of meds x 1 week. Given IV lasix, GDMT restarted. Imdur held with low BP. Seen in ED 3/23 with back pain, felt to be related to MSK. Seen in ED 08/18/21 with SOB, cardiac work up unrevealing.  Admitted 12/07/21-12/10/21 post fall w/ a/c CHF. Diuresed well with IV lasix/metolazone and discharged. He developed an AKI Cr >> 1.2>>1.75, Entresto and spiro were held and he was instructed to restart the next day following discharge. He was seen by paramedicine the following day and he was in no acute distress, taking all meds. Denied CP, SOB and overall feeling well.   Readmitted 12/12/21 w/ 2 days of persistent N/V/D with intermittent CP and SOB. Presented with AKI cr 3.3 2/2 emesis/diarrhea. Responded well to 500 mL NS bolus and  holding of Entresto and spiro. Restarted meds once discharged.   Admitted 8/23 with a/c CHF. Diuresed with IV lasix. Hospitalization c/b with AKI, GDMT initially held; Entresto resumed at low dose, torsemide on MWF. Discharged home, weight 145 lbs.  Today he returns for HF follow up with Thomas West with paramedicine. Says he is feeling better. Breathing better. Can do ADLs without much problem. Fishing frequently. No edema, orthopnea or PND.  Pain much improved. Requiring a lot of assistance from Paramedicine with meds. BP trending up    Cardiac Studies:  - Echo (6/23): EF 30-35%, LV global hypokinesis, Grade 1 DD, RV function normal  - PYP (12/22): suggestive to TTR amyloid and may benefit from addition of tafamadis as outpatient.   - cMRI (11/22): LVEF 24% RVEF 21% LGE and ECW suggestive of cardiac amyloidosis  ROS: All systems reviewed and negative except as per HPI.   Past Medical History:  Diagnosis Date   CHF (congestive heart failure) (HCC)    Coronary artery disease    Diabetes mellitus without complication (HCC)    History of MI (myocardial infarction) 03/05/2019   Hypertension    Stroke [facility])    Current Outpatient Medications  Medication Sig Dispense Refill   aspirin EC 81 MG tablet Take 1 tablet (81 mg total) by mouth daily. Swallow whole. 30 tablet 11   atorvastatin (LIPITOR) 80 MG tablet Take 1 tablet (80 mg total) by mouth daily. 90 tablet 3   carvedilol (COREG)  6.25 MG tablet Take 1 tablet (6.25 mg total) by mouth 2 (two) times daily. 180 tablet 3   clopidogrel (PLAVIX) 75 MG tablet Take 1 tablet (75 mg total) by mouth daily. 90 tablet 0   cyanocobalamin (VITAMIN B12) 1000 MCG tablet TAKE ONE TABLET BY MOUTH DAILY 30 tablet 3   DULoxetine (CYMBALTA) 30 MG capsule Take 1 capsule (30 mg total) by mouth daily. 30 capsule 3   empagliflozin (JARDIANCE) 25 MG TABS tablet Take 1 tablet (25 mg total) by mouth daily before breakfast. 90 tablet 1   ezetimibe (ZETIA) 10 MG tablet  Take 1 tablet (10 mg total) by mouth daily. 30 tablet 3   hydrALAZINE (APRESOLINE) 50 MG tablet Take 100 mg by mouth 3 (three) times daily.     hydrocortisone cream 1 % Apply 1 Application topically 2 (two) times daily as needed for itching. 45 g 1   isosorbide mononitrate (IMDUR) 30 MG 24 hr tablet Take 1 tablet (30 mg total) by mouth daily. 30 tablet 11   linagliptin (TRADJENTA) 5 MG TABS tablet Take 1 tablet (5 mg total) by mouth daily. 30 tablet 3   sacubitril-valsartan (ENTRESTO) 24-26 MG Take 1 tablet by mouth 2 (two) times daily. 60 tablet 3   spironolactone (ALDACTONE) 25 MG tablet Take 1 tablet (25 mg total) by mouth daily. 30 tablet 11   spironolactone (ALDACTONE) 25 MG tablet Take 1 tablet (25 mg total) by mouth daily. 30 tablet 4   Tafamidis (VYNDAMAX) 61 MG CAPS Take 1 capsule by mouth daily. 30 capsule 11   torsemide (DEMADEX) 20 MG tablet Take 1 tablet (20 mg total) by mouth daily. 30 tablet 3   No current facility-administered medications for this encounter.   Allergies  Allergen Reactions   Bee Venom Anaphylaxis, Swelling and Other (See Comments)    Swells all over   Social History   Socioeconomic History   Marital status: Single    Spouse name: Not on file   Number of children: Not on file   Years of education: Not on file   Highest education level: Not on file  Occupational History   Not on file  Tobacco Use   Smoking status: Former    Current packs/day: 0.00    Types: Cigarettes    Quit date: 10/06/2017    Years since quitting: 5.3   Smokeless tobacco: Never  Substance and Sexual Activity   Alcohol use: Yes    Comment: Occasional Beer   Drug use: Yes    Types: Marijuana   Sexual activity: Not on file  Other Topics Concern   Not on file  Social History Narrative   Right Handed.    Lives in a two story home    Lives with 55 year old son.    Social Determinants of Health   Financial Resource Strain: Low Risk  (01/18/2022)   Overall Financial Resource  Strain (CARDIA)    Difficulty of Paying Living Expenses: Not hard at all  Food Insecurity: No Food Insecurity (09/21/2022)   Hunger Vital Sign    Worried About Running Out of Food in the Last Year: Never true    Ran Out of Food in the Last Year: Never true  Transportation Needs: No Transportation Needs (06/04/2022)   PRAPARE - Administrator, Civil Service (Medical): No    Lack of Transportation (Non-Medical): No  Physical Activity: Inactive (01/18/2022)   Exercise Vital Sign    Days of Exercise per Week: 0 days  Minutes of Exercise per Session: 0 min  Stress: Stress Concern Present (01/18/2022)   [employer] of Occupational Health - Occupational Stress Questionnaire    Feeling of Stress : To some extent  Social Connections: Socially Isolated (01/18/2022)   Social Connection and Isolation Panel [NHANES]    Frequency of Communication with Friends and Family: More than three times a week    Frequency of Social Gatherings with Friends and Family: More than three times a week    Attends Religious Services: Never    Database administrator or Organizations: No    Attends Banker Meetings: Never    Marital Status: Never married  Intimate Partner Violence: Not At Risk (01/18/2022)   Humiliation, Afraid, Rape, and Kick questionnaire    Fear of Current or Ex-Partner: No    Emotionally Abused: No    Physically Abused: No    Sexually Abused: No   Family History  Problem Relation Age of Onset   Diabetes Mother    Kidney failure Mother    BP (!) 180/110   Pulse 79   Wt 75.8 kg (167 lb)   SpO2 97%   BMI 24.66 kg/m   Wt Readings from Last 3 Encounters:  02/02/23 75.8 kg (167 lb)  02/02/23 75.8 kg (167 lb)  01/26/23 72.8 kg (160 lb 6.4 oz)   PHYSICAL EXAM: General:  Well appearing. No resp difficulty HEENT: normal Neck: supple. no JVD. Carotids 2+ bilat; no bruits. No lymphadenopathy or thryomegaly appreciated. Cor: PMI nondisplaced. Regular rate &  rhythm. No rubs, gallops or murmurs. Lungs: clear Abdomen: soft, nontender, nondistended. No hepatosplenomegaly. No bruits or masses. Good bowel sounds. Extremities: no cyanosis, clubbing, rash, edema Neuro: alert & orientedx3, cranial nerves grossly intact. moves all 4 extremities w/o difficulty. Affect pleasant  ASSESSMENT & PLAN:  Chronic Systolic HF/Cardiac amyloidosis - Onset not certain. He reports cardiomyopathy diagnosed just prior to MI while living in [place] a couple of years ago. - Echo (10/22): EF 20-25%, RV ok - R/LHC (10/22): Patent RPLV stent with nonobstructive disease elsewhere, Preserved CO/CI, RA mean 5 mmHg, PCWP mean 5 mmHg - cMRI (11/22): LVEF 24% RVEF 21% LGE and ECW suggestive of cardiac amyloidosis. 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 - Multiple myeloma panel (12/22) with no m-spike. 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 Volume OK - Continue torsemide 20 mg MWF. Can take extra 20 mg PRN weight gain/swelling. - Continue hydralazine 100 mg tid and Imdur 30 mg daily.  - Increase Entresto to 97/103 mg bid. - Continue Jardiance 10 mg daily. - Continue carvedilol 6.25 mg bid.  - Continue spironolactone 25 mg daily.   - Continue Tafamadis.  - Consider addition of silencer when approved for non-familial  2. wtTTR cardiac amyloidosis - Continue tafamadis. - Has seen Dr. Tyson Alias  - Consider addition of silencer when approved for non-familial  3. CAD - Prior MI followed by PCI in [place] in either 2019 or 2020.  - Patent RPLV stent on [facility] 10/22. Also had 20% distal RCA, 30% p LAD, 70% d LAD, 60% OM2 - Stent placed 2020. >65yr since stent placement, could consider stopping Plavix d/t issues with noncompliance. - No s/s angina  - Continue ASA.  - Continue atorvastatin 80 mg daily.    4. HTN -  Blood pressure very high - Increase Entresto to 97/103 bid  5. Uncontrolled DM - A1c down to 7.3 from 12. - Continue Jardiance.  6. Neuropathy - Unclear if due to DM2 or TTR - Saw Dr. Allena Katz in Neurology as above, with wild type TTR, current therapies not approved. - Continue supportive care.  7. CKD stage IIIb - Baseline SCr 1.7-2.0 - BMET today.   8. Hx fall - CT: L-spine on 12/08/2021 was negative for acute fracture or subluxation of the L-spine. - No further falls. Neurology has arranged balance PT.  9. SDOH - He has Medicaid and disability. - Transportation is an issue, he has a car but unable to drive (needs license). - 83 year old son helps with meds. - Paramedicine follows, appreciated their assistance.  Total time spent 45 minutes. Over half that time spent discussing above.    Arvilla Meres, MD  11:16 AM

## 2023-02-02 NOTE — Patient Instructions (Signed)
Medication Changes:  INCREASE ENTRESTO TO 97/103MG  TWICE DAILY   Lab Work:  Labs done today, your results will be available in MyChart, we will contact you for abnormal readings.  Follow-Up in: 6 WEEKS AS SCHEDULED WITH APP   At the Advanced Heart Failure Clinic, you and your health needs are our priority. We have a designated team specialized in the treatment of Heart Failure. This Care Team includes your primary Heart Failure Specialized Cardiologist (physician), Advanced Practice Providers (APPs- Physician Assistants and Nurse Practitioners), and Pharmacist who all work together to provide you with the care you need, when you need it.   You may see any of the following providers on your designated Care Team at your next follow up:  Dr. Arvilla Meres Dr. Marca Ancona Dr. Marcos Eke, NP Robbie Lis, [place] [facility] [place], [place] Brynda Peon, NP Karle Plumber, PharmD   Please be sure to bring in all your medications bottles to every appointment.   Need to Contact us:  If you have any questions or concerns before your next appointment please send [place] a message through [place] or call our office at [phone].    TO LEAVE A MESSAGE FOR THE NURSE SELECT OPTION 2, PLEASE LEAVE A MESSAGE INCLUDING: YOUR NAME DATE OF BIRTH CALL BACK NUMBER REASON FOR CALL**this is important as we prioritize the call backs  YOU WILL RECEIVE A CALL BACK THE SAME DAY AS LONG AS YOU CALL BEFORE 4:00 PM

## 2023-02-03 ENCOUNTER — Other Ambulatory Visit (HOSPITAL_COMMUNITY): Payer: Self-pay

## 2023-02-04 ENCOUNTER — Other Ambulatory Visit (HOSPITAL_COMMUNITY): Payer: Self-pay

## 2023-02-07 ENCOUNTER — Other Ambulatory Visit (HOSPITAL_COMMUNITY): Payer: Self-pay

## 2023-02-11 ENCOUNTER — Other Ambulatory Visit (HOSPITAL_COMMUNITY): Payer: Self-pay | Admitting: Emergency Medicine

## 2023-02-11 ENCOUNTER — Telehealth (HOSPITAL_COMMUNITY): Payer: Self-pay

## 2023-02-11 ENCOUNTER — Other Ambulatory Visit (HOSPITAL_COMMUNITY): Payer: Self-pay

## 2023-02-11 ENCOUNTER — Other Ambulatory Visit (HOSPITAL_COMMUNITY): Payer: Self-pay | Admitting: Internal Medicine

## 2023-02-11 ENCOUNTER — Other Ambulatory Visit: Payer: Self-pay

## 2023-02-11 ENCOUNTER — Other Ambulatory Visit (HOSPITAL_COMMUNITY): Payer: Self-pay | Admitting: Family Medicine

## 2023-02-11 DIAGNOSIS — I2510 Atherosclerotic heart disease of native coronary artery without angina pectoris: Secondary | ICD-10-CM

## 2023-02-11 MED ORDER — HYDRALAZINE HCL 50 MG PO TABS
75.0000 mg | ORAL_TABLET | Freq: Three times a day (TID) | ORAL | 9 refills | Status: DC
  Filled 2023-02-11 (×2): qty 135, 30d supply, fill #0

## 2023-02-11 MED ORDER — ATORVASTATIN CALCIUM 80 MG PO TABS
80.0000 mg | ORAL_TABLET | Freq: Every day | ORAL | 3 refills | Status: DC
  Filled 2023-02-11: qty 90, 90d supply, fill #0
  Filled 2023-06-01: qty 90, 90d supply, fill #1
  Filled 2023-08-11: qty 90, 90d supply, fill #2
  Filled 2023-11-15 (×2): qty 90, 90d supply, fill #3

## 2023-02-11 MED ORDER — HYDRALAZINE HCL 50 MG PO TABS
100.0000 mg | ORAL_TABLET | Freq: Three times a day (TID) | ORAL | 11 refills | Status: DC
  Filled 2023-02-11 – 2023-03-02 (×2): qty 180, 30d supply, fill #0
  Filled 2023-03-30: qty 180, 30d supply, fill #1
  Filled 2023-04-05 – 2023-04-22 (×2): qty 180, 30d supply, fill #2
  Filled 2023-05-27: qty 180, 30d supply, fill #3
  Filled 2023-07-06 – 2023-07-20 (×3): qty 180, 30d supply, fill #4
  Filled 2023-08-11 – 2023-08-12 (×2): qty 180, 30d supply, fill #5
  Filled 2023-09-12: qty 180, 30d supply, fill #6
  Filled 2023-10-11: qty 180, 30d supply, fill #7
  Filled 2023-11-15 (×2): qty 180, 30d supply, fill #8
  Filled 2023-12-28 – 2024-01-18 (×2): qty 180, 30d supply, fill #9

## 2023-02-11 NOTE — Telephone Encounter (Signed)
Contacted [facility] for refilling Thomas West's Atorvastatin and Hydralzine- [facility] advised they are needing new refills for same. I will forward to triage.   Maralyn Sago, EMT-Paramedic [phone] 02/11/2023

## 2023-02-11 NOTE — Addendum Note (Signed)
Addended by: Theresia Bough on: 02/11/2023 03:58 PM   Modules accepted: Orders

## 2023-02-11 NOTE — Telephone Encounter (Signed)
RX sent to [facility] was the incorrect dosing as he is to be taking 100mg  TID per Dr. Gala Romney on 02/02/23.   [facility] filled the 75mg  TID and [name] will use the 50mg  tablets to get him through the month as far as the doses will get him however [facility] will need updated RX for 100mg  TID please.   Thanks!   Maralyn Sago, EMT-Paramedic [phone] 02/11/2023

## 2023-02-11 NOTE — Telephone Encounter (Signed)
Updated hydralazine sent to pharm

## 2023-02-11 NOTE — Progress Notes (Signed)
Paramedicine Encounter    Patient ID: Thomas West, male    DOB: 01/13/1961, 62 y.o.   MRN: [id number]   Complaints  NONE  Assessment A&O x 4, skin W&D w/ good color.  Lung sounds clear and equal bilat and no edema noted. Pt states, "I feel great."  Compliance with meds Missed 1 a.m. dose  Pill box filled x 1 week  Refills needed Atorvastatin, Hydralazine, Vyndamax  Meds changes since last visit NONE    Social changes NONE   BP (!) 170/100 (BP Location: Left Arm, Patient Position: Sitting, Cuff Size: Normal)   Pulse 80   Resp 14   Wt 167 lb (75.8 kg)   SpO2 96%   BMI 24.66 kg/m  Weight yesterday-not taken Last visit weight- 167lb  Home visit today finds Thomas West stating, "I feel great!"  He had an appointment set up with him for Wednesday 9/18 but he had called and rescheduled for today.  He says he has been compliant with his meds, by my calculations from last visit he would have missed 2 days of meds.  I discussed with him that getting a good report from his last visit with Dr. Gala Romney didn't mean he could slack off on taking his meds like he is supposed to and and he states he understands.   Pt denies chest pain or SOB.  Lung sounds clear and equal bilat. No edema noted. Med box reconciled x 1 week.  Refills ordered from [facility].  He is completely out of his Hydralazine and I will p/u his refills in order to prevent a gap in med and deliver to him this afternoon.  ACTION: Home visit completed  [name] [phone] 02/11/23  Patient Care Team: [name], [name], MD as PCP - General (Family Medicine) Bensimhon, Bevelyn Buckles, MD as PCP - Cardiology (Cardiology) Glendale Chard, DO as Consulting Physician (Neurology) Sherrill Raring, [facility] (Pharmacist)  Patient Active Problem List   Diagnosis Date Noted   Cardiac amyloidosis [facility]) 05/25/2022   Pruritic rash 05/25/2022   Polyneuropathy associated with underlying disease (HCC) 01/15/2022    Atherosclerosis of aorta (HCC) 01/15/2022   History of CVA (cerebrovascular accident) without residual deficits 12/24/2021   AKI (acute kidney injury) (HCC) 12/13/2021   Diarrhea 12/13/2021   Shortness of breath 12/13/2021   Chest pain 12/13/2021   Nausea and vomiting 12/13/2021   Heart failure (HCC) 12/08/2021   Hypertension associated with diabetes (HCC) 07/12/2021   Chronic kidney disease, stage 3a (HCC) 07/12/2021   Hyperkalemia 07/12/2021   CHF exacerbation (HCC) 04/20/2021   HFrEF (heart failure with reduced ejection fraction) (HCC) 02/03/2021   Diabetes mellitus (HCC) 03/27/2019   Hyperlipidemia associated with type 2 diabetes mellitus (HCC) 03/27/2019   CAD (coronary artery disease) 03/05/2019   History of MI (myocardial infarction) 03/05/2019   Type 2 diabetes mellitus with diabetic neuropathy, unspecified (HCC) 03/05/2019   HLD (hyperlipidemia) 03/05/2019   GERD (gastroesophageal reflux disease) 03/05/2019   Hypertension, essential, benign 03/05/2019    Current Outpatient Medications:    aspirin EC 81 MG tablet, Take 1 tablet (81 mg total) by mouth daily. Swallow whole., Disp: 30 tablet, Rfl: 11   atorvastatin (LIPITOR) 80 MG tablet, Take 1 tablet (80 mg total) by mouth daily., Disp: 90 tablet, Rfl: 3   carvedilol (COREG) 6.25 MG tablet, Take 1 tablet (6.25 mg total) by mouth 2 (two) times daily., Disp: 180 tablet, Rfl: 3   clopidogrel (PLAVIX) 75 MG tablet, Take 1  tablet (75 mg total) by mouth daily., Disp: 90 tablet, Rfl: 0   cyanocobalamin (VITAMIN B12) 1000 MCG tablet, TAKE ONE TABLET BY MOUTH DAILY, Disp: 30 tablet, Rfl: 3   DULoxetine (CYMBALTA) 30 MG capsule, Take 1 capsule (30 mg total) by mouth daily., Disp: 30 capsule, Rfl: 3   empagliflozin (JARDIANCE) 25 MG TABS tablet, Take 1 tablet (25 mg total) by mouth daily before breakfast., Disp: 90 tablet, Rfl: 1   ezetimibe (ZETIA) 10 MG tablet, Take 1 tablet (10 mg total) by mouth daily., Disp: 30 tablet, Rfl: 3    hydrocortisone cream 1 %, Apply 1 Application topically 2 (two) times daily as needed for itching., Disp: 45 g, Rfl: 1   isosorbide mononitrate (IMDUR) 30 MG 24 hr tablet, Take 1 tablet (30 mg total) by mouth daily., Disp: 30 tablet, Rfl: 11   linagliptin (TRADJENTA) 5 MG TABS tablet, Take 1 tablet (5 mg total) by mouth daily., Disp: 30 tablet, Rfl: 3   sacubitril-valsartan (ENTRESTO) 97-103 MG, Take 1 tablet by mouth 2 (two) times daily., Disp: 60 tablet, Rfl: 3   spironolactone (ALDACTONE) 25 MG tablet, Take 1 tablet (25 mg total) by mouth daily., Disp: 30 tablet, Rfl: 11   Tafamidis (VYNDAMAX) 61 MG CAPS, Take 1 capsule by mouth daily., Disp: 30 capsule, Rfl: 11   torsemide (DEMADEX) 20 MG tablet, Take 20 mg by mouth daily. Take 10mg  on Monday, Wednesday, Friday. Take 20mg  all other days., Disp: , Rfl:    hydrALAZINE (APRESOLINE) 50 MG tablet, Take 100 mg by mouth 3 (three) times daily., Disp: , Rfl:    spironolactone (ALDACTONE) 25 MG tablet, Take 1 tablet (25 mg total) by mouth daily. (Patient not taking: Reported on 02/11/2023), Disp: 30 tablet, Rfl: 4 Allergies  Allergen Reactions   Bee Venom Anaphylaxis, Swelling and Other (See Comments)    Swells all over     Social History   Socioeconomic History   Marital status: Single    Spouse name: Not on file   Number of children: Not on file   Years of education: Not on file   Highest education level: Not on file  Occupational History   Not on file  Tobacco Use   Smoking status: Former    Current packs/day: 0.00    Types: Cigarettes    Quit date: 10/06/2017    Years since quitting: 5.3   Smokeless tobacco: Never  Substance and Sexual Activity   Alcohol use: Yes    Comment: Occasional Beer   Drug use: Yes    Types: Marijuana   Sexual activity: Not on file  Other Topics Concern   Not on file  Social History Narrative   Right Handed.    Lives in a two story home    Lives with 42 year old son.    Social Determinants of Health    Financial Resource Strain: Low Risk  (01/18/2022)   Overall Financial Resource Strain (CARDIA)    Difficulty of Paying Living Expenses: Not hard at all  Food Insecurity: No Food Insecurity (09/21/2022)   Hunger Vital Sign    Worried About Running Out of Food in the Last Year: Never true    Ran Out of Food in the Last Year: Never true  Transportation Needs: No Transportation Needs (06/04/2022)   PRAPARE - Administrator, Civil Service (Medical): No    Lack of Transportation (Non-Medical): No  Physical Activity: Inactive (01/18/2022)   Exercise Vital Sign    Days of  Exercise per Week: 0 days    Minutes of Exercise per Session: 0 min  Stress: Stress Concern Present (01/18/2022)   [employer] of Occupational Health - Occupational Stress Questionnaire    Feeling of Stress : To some extent  Social Connections: Socially Isolated (01/18/2022)   Social Connection and Isolation Panel [NHANES]    Frequency of Communication with Friends and Family: More than three times a week    Frequency of Social Gatherings with Friends and Family: More than three times a week    Attends Religious Services: Never    Database administrator or Organizations: No    Attends Banker Meetings: Never    Marital Status: Never married  Intimate Partner Violence: Not At Risk (01/18/2022)   Humiliation, Afraid, Rape, and Kick questionnaire    Fear of Current or Ex-Partner: No    Emotionally Abused: No    Physically Abused: No    Sexually Abused: No    Physical Exam      Future Appointments  Date Time Provider Department Center  03/16/2023 11:00 AM [facility] PA/NP [facility] None

## 2023-02-12 ENCOUNTER — Encounter (HOSPITAL_COMMUNITY): Payer: Self-pay

## 2023-02-14 ENCOUNTER — Other Ambulatory Visit (HOSPITAL_COMMUNITY): Payer: Self-pay

## 2023-02-15 ENCOUNTER — Other Ambulatory Visit (HOSPITAL_COMMUNITY): Payer: Self-pay | Admitting: Emergency Medicine

## 2023-02-15 ENCOUNTER — Other Ambulatory Visit: Payer: Self-pay

## 2023-02-15 NOTE — Progress Notes (Unsigned)
Paramedicine Encounter    Patient ID: Thomas West, male    DOB: 19-Jan-1961, 62 y.o.   MRN: [id number]   Complaints NONE  Assessment***  Compliance with meds: NO took only morning meds 3 days.  Missing noon and evening.    Pill box filled X 1 week  Refills needed***  Meds changes since last visit***    Social changes***   BP 122/80   Pulse 90   Wt 164 lb 3.2 oz (74.5 kg)   SpO2 95%   BMI 24.25 kg/m  Weight yesterday-not taken Last visit weight-167lb  ACTION: {Paramed Action:[phone]}  Beatrix Shipper, EMT-Paramedic [phone] 02/15/23  Patient Care Team: [name], [name], MD as PCP - General (Family Medicine) Bensimhon, Bevelyn Buckles, MD as PCP - Cardiology (Cardiology) Glendale Chard, DO as Consulting Physician (Neurology) Sherrill Raring, [facility] (Pharmacist)  Patient Active Problem List   Diagnosis Date Noted  . 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 Pruritic rash 05/25/2022  . Polyneuropathy associated with underlying disease (HCC) 01/15/2022  . Atherosclerosis of aorta (HCC) 01/15/2022  . History of CVA (cerebrovascular accident) without residual deficits 12/24/2021  . AKI (acute kidney injury) (HCC) 12/13/2021  . Diarrhea 12/13/2021  . Shortness of breath 12/13/2021  . Chest pain 12/13/2021  . Nausea and vomiting 12/13/2021  . Heart failure (HCC) 12/08/2021  . Hypertension associated with diabetes (HCC) 07/12/2021  . Chronic kidney disease, stage 3a (HCC) 07/12/2021  . Hyperkalemia 07/12/2021  . CHF exacerbation (HCC) 04/20/2021  . HFrEF (heart failure with reduced ejection fraction) (HCC) 02/03/2021  . Diabetes mellitus (HCC) 03/27/2019  . Hyperlipidemia associated with type 2 diabetes mellitus (HCC) 03/27/2019  . CAD (coronary artery disease) 03/05/2019  . History of MI (myocardial infarction) 03/05/2019  . Type 2 diabetes mellitus with diabetic neuropathy, unspecified (HCC) 03/05/2019  . HLD (hyperlipidemia) 03/05/2019  . GERD (gastroesophageal  reflux disease) 03/05/2019  . Hypertension, essential, benign 03/05/2019    Current Outpatient Medications:  .  aspirin EC 81 MG tablet, Take 1 tablet (81 mg total) by mouth daily. Swallow whole., Disp: 30 tablet, Rfl: 11 .  atorvastatin (LIPITOR) 80 MG tablet, Take 1 tablet (80 mg total) by mouth daily., Disp: 90 tablet, Rfl: 3 .  carvedilol (COREG) 6.25 MG tablet, Take 1 tablet (6.25 mg total) by mouth 2 (two) times daily., Disp: 180 tablet, Rfl: 3 .  clopidogrel (PLAVIX) 75 MG tablet, Take 1 tablet (75 mg total) by mouth daily., Disp: 90 tablet, Rfl: 0 .  cyanocobalamin (VITAMIN B12) 1000 MCG tablet, TAKE ONE TABLET BY MOUTH DAILY, Disp: 30 tablet, Rfl: 3 .  DULoxetine (CYMBALTA) 30 MG capsule, Take 1 capsule (30 mg total) by mouth daily., Disp: 30 capsule, Rfl: 3 .  empagliflozin (JARDIANCE) 25 MG TABS tablet, Take 1 tablet (25 mg total) by mouth daily before breakfast., Disp: 90 tablet, Rfl: 1 .  ezetimibe (ZETIA) 10 MG tablet, Take 1 tablet (10 mg total) by mouth daily., Disp: 30 tablet, Rfl: 3 .  hydrALAZINE (APRESOLINE) 50 MG tablet, Take 2 tablets (100 mg total) by mouth 3 (three) times daily., Disp: 180 tablet, Rfl: 11 .  hydrocortisone cream 1 %, Apply 1 Application topically 2 (two) times daily as needed for itching., Disp: 45 g, Rfl: 1 .  isosorbide mononitrate (IMDUR) 30 MG 24 hr tablet, Take 1 tablet (30 mg total) by mouth daily., Disp: 30 tablet, Rfl: 11 .  linagliptin (TRADJENTA) 5 MG TABS tablet, Take 1 tablet (5 mg total) by mouth daily., Disp: 30  tablet, Rfl: 3 .  sacubitril-valsartan (ENTRESTO) 97-103 MG, Take 1 tablet by mouth 2 (two) times daily., Disp: 60 tablet, Rfl: 3 .  spironolactone (ALDACTONE) 25 MG tablet, Take 1 tablet (25 mg total) by mouth daily., Disp: 30 tablet, Rfl: 11 .  spironolactone (ALDACTONE) 25 MG tablet, Take 1 tablet (25 mg total) by mouth daily., Disp: 30 tablet, Rfl: 4 .  Tafamidis (VYNDAMAX) 61 MG CAPS, Take 1 capsule by mouth daily., Disp: 30  capsule, Rfl: 11 .  torsemide (DEMADEX) 20 MG tablet, Take 20 mg by mouth daily. Take 10mg  on Monday, Wednesday, Friday. Take 20mg  all other days., Disp: , Rfl:  Allergies  Allergen Reactions  . Bee Venom Anaphylaxis, Swelling and Other (See Comments)    Swells all over     Social History   Socioeconomic History  . Marital status: Single    Spouse name: Not on file  . Number of children: Not on file  . Years of education: Not on file  . Highest education level: Not on file  Occupational History  . Not on file  Tobacco Use  . Smoking status: Former    Current packs/day: 0.00    Types: Cigarettes    Quit date: 10/06/2017    Years since quitting: 5.3  . Smokeless tobacco: Never  Substance and Sexual Activity  . Alcohol use: Yes    Comment: Occasional Beer  . Drug use: Yes    Types: Marijuana  . Sexual activity: Not on file  Other Topics Concern  . Not on file  Social History Narrative   Right Handed.    Lives in a two story home    Lives with 32 year old son.    Social Determinants of Health   Financial Resource Strain: Low Risk  (01/18/2022)   Overall Financial Resource Strain (CARDIA)   . Difficulty of Paying Living Expenses: Not hard at all  Food Insecurity: No Food Insecurity (09/21/2022)   Hunger Vital Sign   . Worried About Programme researcher, broadcasting/film/video in the Last Year: Never true   . Ran Out of Food in the Last Year: Never true  Transportation Needs: No Transportation Needs (06/04/2022)   PRAPARE - Transportation   . Lack of Transportation (Medical): No   . Lack of Transportation (Non-Medical): No  Physical Activity: Inactive (01/18/2022)   Exercise Vital Sign   . Days of Exercise per Week: 0 days   . Minutes of Exercise per Session: 0 min  Stress: Stress Concern Present (01/18/2022)   [employer] of Occupational Health - Occupational Stress Questionnaire   . Feeling of Stress : To some extent  Social Connections: Socially Isolated (01/18/2022)   Social  Connection and Isolation Panel [NHANES]   . Frequency of Communication with Friends and Family: More than three times a week   . Frequency of Social Gatherings with Friends and Family: More than three times a week   . Attends Religious Services: Never   . Active Member of Clubs or Organizations: No   . Attends Banker Meetings: Never   . Marital Status: Never married  Intimate Partner Violence: Not At Risk (01/18/2022)   Humiliation, Afraid, Rape, and Kick questionnaire   . Fear of Current or Ex-Partner: No   . Emotionally Abused: No   . Physically Abused: No   . Sexually Abused: No    Physical Exam      Future Appointments  Date Time Provider Department Center  03/16/2023 11:00 AM [facility] PA/NP  [facility] None

## 2023-02-23 ENCOUNTER — Other Ambulatory Visit (HOSPITAL_COMMUNITY): Payer: Self-pay | Admitting: Emergency Medicine

## 2023-02-23 NOTE — Progress Notes (Signed)
Paramedicine Encounter    Patient ID: Thomas West, male    DOB: 1961/02/12, 62 y.o.   MRN: [id number]   Complaints NONE  Assessment A&O x 4, skin W&D w/ good color.  No chest pain or SOB.  Lung sounds clear and equal bilat.  No edema noted  Compliance with meds YES  Pill box filled x 1 week  Refills needed B12, ASA, Isosorbide  Meds changes since last visit NONE    Social changes NONE   BP (!) 170/80 (BP Location: Left Arm, Patient Position: Sitting, Cuff Size: Normal)   Pulse 80   Resp 16   Wt 160 lb 12.8 oz (72.9 kg)   SpO2 98%   BMI 23.75 kg/m  Weight yesterday-not taken Last visit weight-164lb  Today's home visit finds Thomas West stating he feels well.  He is hypertensive today @ 170/80 he has not taken his morning meds.  He denies headache, visual disturbances or weakness.  He has been compliant with his medications. Med box reconciled x 1 week.  Refills called in to be delivered. Pt denies chest pain or SOB.  Lung sounds clear and equal throughout.  No edema noted. Next home visit 03/02/23 @ 8:30.   ACTION: Home visit completed  [name] [phone] 02/23/23  Patient Care Team: [name], [name], MD as PCP - General (Family Medicine) Bensimhon, Bevelyn Buckles, MD as PCP - Cardiology (Cardiology) Glendale Chard, DO as Consulting Physician (Neurology) Sherrill Raring, [facility] (Pharmacist)  Patient Active Problem List   Diagnosis Date Noted   Cardiac amyloidosis [facility]) 05/25/2022   Pruritic rash 05/25/2022   Polyneuropathy associated with underlying disease (HCC) 01/15/2022   Atherosclerosis of aorta (HCC) 01/15/2022   History of CVA (cerebrovascular accident) without residual deficits 12/24/2021   AKI (acute kidney injury) (HCC) 12/13/2021   Diarrhea 12/13/2021   Shortness of breath 12/13/2021   Chest pain 12/13/2021   Nausea and vomiting 12/13/2021   Heart failure (HCC) 12/08/2021   Hypertension associated with diabetes (HCC) 07/12/2021    Chronic kidney disease, stage 3a (HCC) 07/12/2021   Hyperkalemia 07/12/2021   CHF exacerbation (HCC) 04/20/2021   HFrEF (heart failure with reduced ejection fraction) (HCC) 02/03/2021   Diabetes mellitus (HCC) 03/27/2019   Hyperlipidemia associated with type 2 diabetes mellitus (HCC) 03/27/2019   CAD (coronary artery disease) 03/05/2019   History of MI (myocardial infarction) 03/05/2019   Type 2 diabetes mellitus with diabetic neuropathy, unspecified (HCC) 03/05/2019   HLD (hyperlipidemia) 03/05/2019   GERD (gastroesophageal reflux disease) 03/05/2019   Hypertension, essential, benign 03/05/2019    Current Outpatient Medications:    atorvastatin (LIPITOR) 80 MG tablet, Take 1 tablet (80 mg total) by mouth daily., Disp: 90 tablet, Rfl: 3   carvedilol (COREG) 6.25 MG tablet, Take 1 tablet (6.25 mg total) by mouth 2 (two) times daily., Disp: 180 tablet, Rfl: 3   clopidogrel (PLAVIX) 75 MG tablet, Take 1 tablet (75 mg total) by mouth daily., Disp: 90 tablet, Rfl: 0   cyanocobalamin (VITAMIN B12) 1000 MCG tablet, TAKE ONE TABLET BY MOUTH DAILY, Disp: 30 tablet, Rfl: 3   DULoxetine (CYMBALTA) 30 MG capsule, Take 1 capsule (30 mg total) by mouth daily., Disp: 30 capsule, Rfl: 3   empagliflozin (JARDIANCE) 25 MG TABS tablet, Take 1 tablet (25 mg total) by mouth daily before breakfast., Disp: 90 tablet, Rfl: 1   ezetimibe (ZETIA) 10 MG tablet, Take 1 tablet (10 mg total) by mouth daily., Disp: 30 tablet, Rfl: 3  hydrALAZINE (APRESOLINE) 50 MG tablet, Take 2 tablets (100 mg total) by mouth 3 (three) times daily., Disp: 180 tablet, Rfl: 11   hydrocortisone cream 1 %, Apply 1 Application topically 2 (two) times daily as needed for itching., Disp: 45 g, Rfl: 1   isosorbide mononitrate (IMDUR) 30 MG 24 hr tablet, Take 1 tablet (30 mg total) by mouth daily., Disp: 30 tablet, Rfl: 11   linagliptin (TRADJENTA) 5 MG TABS tablet, Take 1 tablet (5 mg total) by mouth daily., Disp: 30 tablet, Rfl: 3    sacubitril-valsartan (ENTRESTO) 97-103 MG, Take 1 tablet by mouth 2 (two) times daily., Disp: 60 tablet, Rfl: 3   spironolactone (ALDACTONE) 25 MG tablet, Take 1 tablet (25 mg total) by mouth daily., Disp: 30 tablet, Rfl: 11   Tafamidis (VYNDAMAX) 61 MG CAPS, Take 1 capsule by mouth daily., Disp: 30 capsule, Rfl: 11   torsemide (DEMADEX) 20 MG tablet, Take 20 mg by mouth daily. Take 10mg  on Monday, Wednesday, Friday. Take 20mg  all other days., Disp: , Rfl:    aspirin EC 81 MG tablet, Take 1 tablet (81 mg total) by mouth daily. Swallow whole., Disp: 30 tablet, Rfl: 11   spironolactone (ALDACTONE) 25 MG tablet, Take 1 tablet (25 mg total) by mouth daily., Disp: 30 tablet, Rfl: 4 Allergies  Allergen Reactions   Bee Venom Anaphylaxis, Swelling and Other (See Comments)    Swells all over     Social History   Socioeconomic History   Marital status: Single    Spouse name: Not on file   Number of children: Not on file   Years of education: Not on file   Highest education level: Not on file  Occupational History   Not on file  Tobacco Use   Smoking status: Former    Current packs/day: 0.00    Types: Cigarettes    Quit date: 10/06/2017    Years since quitting: 5.3   Smokeless tobacco: Never  Substance and Sexual Activity   Alcohol use: Yes    Comment: Occasional Beer   Drug use: Yes    Types: Marijuana   Sexual activity: Not on file  Other Topics Concern   Not on file  Social History Narrative   Right Handed.    Lives in a two story home    Lives with 52 year old son.    Social Determinants of Health   Financial Resource Strain: Low Risk  (01/18/2022)   Overall Financial Resource Strain (CARDIA)    Difficulty of Paying Living Expenses: Not hard at all  Food Insecurity: No Food Insecurity (09/21/2022)   Hunger Vital Sign    Worried About Running Out of Food in the Last Year: Never true    Ran Out of Food in the Last Year: Never true  Transportation Needs: No Transportation Needs  (06/04/2022)   PRAPARE - Administrator, Civil Service (Medical): No    Lack of Transportation (Non-Medical): No  Physical Activity: Inactive (01/18/2022)   Exercise Vital Sign    Days of Exercise per Week: 0 days    Minutes of Exercise per Session: 0 min  Stress: Stress Concern Present (01/18/2022)   [employer] of Occupational Health - Occupational Stress Questionnaire    Feeling of Stress : To some extent  Social Connections: Socially Isolated (01/18/2022)   Social Connection and Isolation Panel [NHANES]    Frequency of Communication with Friends and Family: More than three times a week    Frequency of Social  Gatherings with Friends and Family: More than three times a week    Attends Religious Services: Never    Database administrator or Organizations: No    Attends Banker Meetings: Never    Marital Status: Never married  Intimate Partner Violence: Not At Risk (01/18/2022)   Humiliation, Afraid, Rape, and Kick questionnaire    Fear of Current or Ex-Partner: No    Emotionally Abused: No    Physically Abused: No    Sexually Abused: No    Physical Exam      Future Appointments  Date Time Provider Department Center  03/16/2023 11:00 AM [facility] PA/NP [facility] None

## 2023-02-25 ENCOUNTER — Other Ambulatory Visit: Payer: Self-pay

## 2023-02-25 ENCOUNTER — Other Ambulatory Visit (HOSPITAL_COMMUNITY): Payer: Self-pay | Admitting: Internal Medicine

## 2023-03-02 ENCOUNTER — Other Ambulatory Visit (HOSPITAL_COMMUNITY): Payer: Self-pay | Admitting: Emergency Medicine

## 2023-03-02 ENCOUNTER — Other Ambulatory Visit (HOSPITAL_COMMUNITY): Payer: Self-pay

## 2023-03-02 ENCOUNTER — Other Ambulatory Visit: Payer: Self-pay

## 2023-03-02 NOTE — Progress Notes (Signed)
Paramedicine Encounter    Patient ID: Thomas West, male    DOB: [birth year], 62 y.o.   MRN: [id number]   Complaints NONE  Assessment A&O x 4, skin W&D w/ good color.  Lung sounds clear throughout.  No peripheral edema noted.  No chest pain or SOB.  Compliance with meds YES  Pill box filled x 1 week  Refills needed ASA, B12, Hydralazine   Meds changes since last visit NONE    Social changes NONE   BP 130/80   Pulse 84   Resp 14   Wt 162 lb 6.4 oz (73.7 kg)   SpO2 95%   BMI 23.98 kg/m  Weight yesterday-not taken Last visit weight-160lb  Home visit with Thomas West finds him doing well.  He denies chest pain or SOB.  Lung sounds clear and equal bilat.  No edema noted.  Compliant with all meds.  Pill box reconciled x 1 week. Ordered refills for delivery on ASA, B12 & Hydralazine. Reminded of upcoming clinic appointment 10/23.  ACTION: Home visit completed  [name] [phone] 03/02/23  Patient Care Team: [name], [name], MD as PCP - General (Family Medicine) Bensimhon, Bevelyn Buckles, MD as PCP - Cardiology (Cardiology) Glendale Chard, DO as Consulting Physician (Neurology) Sherrill Raring, [facility] (Pharmacist)  Patient Active Problem List   Diagnosis Date Noted   Cardiac amyloidosis [facility]) 05/25/2022   Pruritic rash 05/25/2022   Polyneuropathy associated with underlying disease (HCC) 01/15/2022   Atherosclerosis of aorta (HCC) 01/15/2022   History of CVA (cerebrovascular accident) without residual deficits 12/24/2021   AKI (acute kidney injury) (HCC) 12/13/2021   Diarrhea 12/13/2021   Shortness of breath 12/13/2021   Chest pain 12/13/2021   Nausea and vomiting 12/13/2021   Heart failure (HCC) 12/08/2021   Hypertension associated with diabetes (HCC) 07/12/2021   Chronic kidney disease, stage 3a (HCC) 07/12/2021   Hyperkalemia 07/12/2021   CHF exacerbation (HCC) 04/20/2021   HFrEF (heart failure with reduced ejection fraction) (HCC) 02/03/2021    Diabetes mellitus (HCC) 03/27/2019   Hyperlipidemia associated with type 2 diabetes mellitus (HCC) 03/27/2019   CAD (coronary artery disease) 03/05/2019   History of MI (myocardial infarction) 03/05/2019   Type 2 diabetes mellitus with diabetic neuropathy, unspecified (HCC) 03/05/2019   HLD (hyperlipidemia) 03/05/2019   GERD (gastroesophageal reflux disease) 03/05/2019   Hypertension, essential, benign 03/05/2019    Current Outpatient Medications:    aspirin EC 81 MG tablet, Take 1 tablet (81 mg total) by mouth daily. Swallow whole., Disp: 30 tablet, Rfl: 11   atorvastatin (LIPITOR) 80 MG tablet, Take 1 tablet (80 mg total) by mouth daily., Disp: 90 tablet, Rfl: 3   carvedilol (COREG) 6.25 MG tablet, Take 1 tablet (6.25 mg total) by mouth 2 (two) times daily., Disp: 180 tablet, Rfl: 3   clopidogrel (PLAVIX) 75 MG tablet, Take 1 tablet (75 mg total) by mouth daily., Disp: 90 tablet, Rfl: 0   cyanocobalamin (VITAMIN B12) 1000 MCG tablet, TAKE ONE TABLET BY MOUTH DAILY, Disp: 30 tablet, Rfl: 3   DULoxetine (CYMBALTA) 30 MG capsule, Take 1 capsule (30 mg total) by mouth daily., Disp: 30 capsule, Rfl: 3   empagliflozin (JARDIANCE) 25 MG TABS tablet, Take 1 tablet (25 mg total) by mouth daily before breakfast., Disp: 90 tablet, Rfl: 1   ezetimibe (ZETIA) 10 MG tablet, Take 1 tablet (10 mg total) by mouth daily., Disp: 30 tablet, Rfl: 3   hydrALAZINE (APRESOLINE) 50 MG tablet, Take 2 tablets (100 mg total)  by mouth 3 (three) times daily., Disp: 180 tablet, Rfl: 11   hydrocortisone cream 1 %, Apply 1 Application topically 2 (two) times daily as needed for itching., Disp: 45 g, Rfl: 1   isosorbide mononitrate (IMDUR) 30 MG 24 hr tablet, Take 1 tablet (30 mg total) by mouth daily., Disp: 30 tablet, Rfl: 11   linagliptin (TRADJENTA) 5 MG TABS tablet, Take 1 tablet (5 mg total) by mouth daily., Disp: 30 tablet, Rfl: 3   sacubitril-valsartan (ENTRESTO) 97-103 MG, Take 1 tablet by mouth 2 (two) times  daily., Disp: 60 tablet, Rfl: 3   spironolactone (ALDACTONE) 25 MG tablet, Take 1 tablet (25 mg total) by mouth daily., Disp: 30 tablet, Rfl: 11   Tafamidis (VYNDAMAX) 61 MG CAPS, Take 1 capsule by mouth daily., Disp: 30 capsule, Rfl: 11   torsemide (DEMADEX) 20 MG tablet, Take 20 mg by mouth daily. Take 10mg  on Monday, Wednesday, Friday. Take 20mg  all other days., Disp: , Rfl:    spironolactone (ALDACTONE) 25 MG tablet, Take 1 tablet (25 mg total) by mouth daily. (Patient not taking: Reported on 03/02/2023), Disp: 30 tablet, Rfl: 4 Allergies  Allergen Reactions   Bee Venom Anaphylaxis, Swelling and Other (See Comments)    Swells all over     Social History   Socioeconomic History   Marital status: Single    Spouse name: Not on file   Number of children: Not on file   Years of education: Not on file   Highest education level: Not on file  Occupational History   Not on file  Tobacco Use   Smoking status: Former    Current packs/day: 0.00    Types: Cigarettes    Quit date: 10/06/2017    Years since quitting: 5.4   Smokeless tobacco: Never  Substance and Sexual Activity   Alcohol use: Yes    Comment: Occasional Beer   Drug use: Yes    Types: Marijuana   Sexual activity: Not on file  Other Topics Concern   Not on file  Social History Narrative   Right Handed.    Lives in a two story home    Lives with 61 year old son.    Social Determinants of Health   Financial Resource Strain: Low Risk  (01/18/2022)   Overall Financial Resource Strain (CARDIA)    Difficulty of Paying Living Expenses: Not hard at all  Food Insecurity: No Food Insecurity (09/21/2022)   Hunger Vital Sign    Worried About Running Out of Food in the Last Year: Never true    Ran Out of Food in the Last Year: Never true  Transportation Needs: No Transportation Needs (06/04/2022)   PRAPARE - Administrator, Civil Service (Medical): No    Lack of Transportation (Non-Medical): No  Physical Activity:  Inactive (01/18/2022)   Exercise Vital Sign    Days of Exercise per Week: 0 days    Minutes of Exercise per Session: 0 min  Stress: Stress Concern Present (01/18/2022)   [employer] of Occupational Health - Occupational Stress Questionnaire    Feeling of Stress : To some extent  Social Connections: Socially Isolated (01/18/2022)   Social Connection and Isolation Panel [NHANES]    Frequency of Communication with Friends and Family: More than three times a week    Frequency of Social Gatherings with Friends and Family: More than three times a week    Attends Religious Services: Never    Database administrator or Organizations: No  Attends Banker Meetings: Never    Marital Status: Never married  Intimate Partner Violence: Not At Risk (01/18/2022)   Humiliation, Afraid, Rape, and Kick questionnaire    Fear of Current or Ex-Partner: No    Emotionally Abused: No    Physically Abused: No    Sexually Abused: No    Physical Exam      Future Appointments  Date Time Provider Department Center  03/16/2023 11:00 AM [facility] PA/NP [facility] None

## 2023-03-04 ENCOUNTER — Other Ambulatory Visit (HOSPITAL_COMMUNITY): Payer: Self-pay

## 2023-03-07 ENCOUNTER — Other Ambulatory Visit (HOSPITAL_COMMUNITY): Payer: Self-pay

## 2023-03-09 ENCOUNTER — Other Ambulatory Visit (HOSPITAL_COMMUNITY): Payer: Self-pay | Admitting: Emergency Medicine

## 2023-03-09 NOTE — Progress Notes (Unsigned)
Paramedicine Encounter    Patient ID: Thomas West, male    DOB: 1961-03-10, 62 y.o.   MRN: [id number]   Complaints NONE  Assessment Hypertensive, weight up 7lbs.  Mild edema to lower extremities  Compliance with meds:  YES  Pill box filled x 2 weeks  Refills needed NONE  Meds changes since last visit NONE    Social changes NONE   BP (!) 200/100 (BP Location: Left Arm, Patient Position: Sitting, Cuff Size: Normal)   Pulse 90   Wt 169 lb 3.2 oz (76.7 kg)   SpO2 96%   BMI 24.99 kg/m  Weight yesterday- not taken Last visit weight-162lb  [name] in good spirits.  He denies chest pain or SOB.  Lung sounds clear.  He is hypertensive this afternoon 200/100, and denies missing any medications.  He denies headache.  No neurological deficits noted.  I sent message to HF Triage for possible orders.   Med box reconciled for 2 weeks.  He does have an appointment at [facility] coming up on 10/23 @ 11:00 and I will attend this visit with him.    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   Pruritic rash 05/25/2022   Polyneuropathy associated with underlying disease (HCC) 01/15/2022   Atherosclerosis of aorta (HCC) 01/15/2022   History of CVA (cerebrovascular accident) without residual deficits 12/24/2021   AKI (acute kidney injury) (HCC) 12/13/2021   Diarrhea 12/13/2021   Shortness of breath 12/13/2021   Chest pain 12/13/2021   Nausea and vomiting 12/13/2021   Heart failure (HCC) 12/08/2021   Hypertension associated with diabetes (HCC) 07/12/2021   Chronic kidney disease, stage 3a (HCC) 07/12/2021   Hyperkalemia  07/12/2021   CHF exacerbation (HCC) 04/20/2021   HFrEF (heart failure with reduced ejection fraction) (HCC) 02/03/2021   Diabetes mellitus (HCC) 03/27/2019   Hyperlipidemia associated with type 2 diabetes mellitus (HCC) 03/27/2019   CAD (coronary artery disease) 03/05/2019   History of MI (myocardial infarction) 03/05/2019   Type 2 diabetes mellitus with diabetic neuropathy, unspecified (HCC) 03/05/2019   HLD (hyperlipidemia) 03/05/2019   GERD (gastroesophageal reflux disease) 03/05/2019   Hypertension, essential, benign 03/05/2019    Current Outpatient Medications:    aspirin EC 81 MG tablet, Take 1 tablet (81 mg total) by mouth daily. Swallow whole., Disp: 30 tablet, Rfl: 11   atorvastatin (LIPITOR) 80 MG tablet, Take 1 tablet (80 mg total) by mouth daily., Disp: 90 tablet, Rfl: 3   carvedilol (COREG) 6.25 MG tablet, Take 1 tablet (6.25 mg total) by mouth 2 (two) times daily., Disp: 180 tablet, Rfl: 3   clopidogrel (PLAVIX) 75 MG tablet, Take 1 tablet (75 mg total) by mouth daily., Disp: 90 tablet, Rfl: 0   cyanocobalamin (VITAMIN B12) 1000 MCG tablet, TAKE ONE TABLET BY MOUTH DAILY, Disp: 30 tablet, Rfl: 3   DULoxetine (CYMBALTA) 30 MG capsule, Take 1 capsule (30 mg total) by mouth daily., Disp: 30 capsule, Rfl: 3   empagliflozin (JARDIANCE) 25 MG TABS tablet, Take 1 tablet (25 mg total) by mouth daily before breakfast., Disp: 90 tablet, Rfl: 1   ezetimibe (ZETIA) 10 MG tablet, Take 1 tablet (10 mg total) by mouth  daily., Disp: 30 tablet, Rfl: 3   hydrALAZINE (APRESOLINE) 50 MG tablet, Take 2 tablets (100 mg total) by mouth 3 (three) times daily., Disp: 180 tablet, Rfl: 11   hydrocortisone cream 1 %, Apply 1 Application topically 2 (two) times daily as needed for itching., Disp: 45 g, Rfl: 1   isosorbide mononitrate (IMDUR) 30 MG 24 hr tablet, Take 1 tablet (30 mg total) by mouth daily., Disp: 30 tablet, Rfl: 11   linagliptin (TRADJENTA) 5 MG TABS tablet, Take 1 tablet (5 mg total) by  mouth daily., Disp: 30 tablet, Rfl: 3   sacubitril-valsartan (ENTRESTO) 97-103 MG, Take 1 tablet by mouth 2 (two) times daily., Disp: 60 tablet, Rfl: 3   spironolactone (ALDACTONE) 25 MG tablet, Take 1 tablet (25 mg total) by mouth daily., Disp: 30 tablet, Rfl: 11   Tafamidis (VYNDAMAX) 61 MG CAPS, Take 1 capsule by mouth daily., Disp: 30 capsule, Rfl: 11   torsemide (DEMADEX) 20 MG tablet, Take 20 mg by mouth daily. Take 10mg  on Monday, Wednesday, Friday. Take 20mg  all other days., Disp: , Rfl:    spironolactone (ALDACTONE) 25 MG tablet, Take 1 tablet (25 mg total) by mouth daily. (Patient not taking: Reported on 03/02/2023), Disp: 30 tablet, Rfl: 4 Allergies  Allergen Reactions   Bee Venom Anaphylaxis, Swelling and Other (See Comments)    Swells all over     Social History   Socioeconomic History   Marital status: Single    Spouse name: Not on file   Number of children: Not on file   Years of education: Not on file   Highest education level: Not on file  Occupational History   Not on file  Tobacco Use   Smoking status: Former    Current packs/day: 0.00    Types: Cigarettes    Quit date: 10/06/2017    Years since quitting: 5.4   Smokeless tobacco: Never  Substance and Sexual Activity   Alcohol use: Yes    Comment: Occasional Beer   Drug use: Yes    Types: Marijuana   Sexual activity: Not on file  Other Topics Concern   Not on file  Social History Narrative   Right Handed.    Lives in a two story home    Lives with 65 year old son.    Social Determinants of Health   Financial Resource Strain: Low Risk  (01/18/2022)   Overall Financial Resource Strain (CARDIA)    Difficulty of Paying Living Expenses: Not hard at all  Food Insecurity: No Food Insecurity (09/21/2022)   Hunger Vital Sign    Worried About Running Out of Food in the Last Year: Never true    Ran Out of Food in the Last Year: Never true  Transportation Needs: No Transportation Needs (06/04/2022)   PRAPARE -  Administrator, Civil Service (Medical): No    Lack of Transportation (Non-Medical): No  Physical Activity: Inactive (01/18/2022)   Exercise Vital Sign    Days of Exercise per Week: 0 days    Minutes of Exercise per Session: 0 min  Stress: Stress Concern Present (01/18/2022)   [employer] of Occupational Health - Occupational Stress Questionnaire    Feeling of Stress : To some extent  Social Connections: Socially Isolated (01/18/2022)   Social Connection and Isolation Panel [NHANES]    Frequency of Communication with Friends and Family: More than three times a week    Frequency of Social Gatherings with Friends and Family: More than three times  a week    Attends Religious Services: Never    Active Member of Clubs or Organizations: No    Attends Banker Meetings: Never    Marital Status: Never married  Intimate Partner Violence: Not At Risk (01/18/2022)   Humiliation, Afraid, Rape, and Kick questionnaire    Fear of Current or Ex-Partner: No    Emotionally Abused: No    Physically Abused: No    Sexually Abused: No    Physical Exam      Future Appointments  Date Time Provider Department Center  03/10/2023 10:30 AM [facility] PHARMACIST [facility]  03/16/2023 11:00 AM [facility] PA/NP [facility] None

## 2023-03-10 ENCOUNTER — Telehealth (HOSPITAL_COMMUNITY): Payer: Self-pay | Admitting: Cardiology

## 2023-03-10 ENCOUNTER — Other Ambulatory Visit: Payer: Self-pay

## 2023-03-10 ENCOUNTER — Other Ambulatory Visit (HOSPITAL_COMMUNITY): Payer: Self-pay | Admitting: Emergency Medicine

## 2023-03-10 NOTE — Telephone Encounter (Signed)
-----   Message from CMA [name] S sent at 03/09/2023  5:16 PM EDT ----- Regarding: Weight gain 7lbs, HTN, Mild edema Home visit finds Mr. Fulfer up 7lbs from last week's visit.  BP 200/100.  Mild edema to lower extremities.   He denies chest pain or SOB.  Lung sounds clear.  No headache.  Compliant with all meds.   Please advise desired changes if any.  Vernia Buff, EMT-Paramedic [phone] 03/09/2023

## 2023-03-10 NOTE — Telephone Encounter (Signed)
Please advise 

## 2023-03-10 NOTE — Progress Notes (Signed)
Paramedicine Encounter    Patient ID: Thomas West, male    DOB: Feb 16, 1961, 62 y.o.   MRN: [id number]  BP (!) 140/100 (BP Location: Left Arm, Patient Position: Sitting, Cuff Size: Normal)   Resp 16   Wt 167 lb (75.8 kg)   BMI 24.66 kg/m  Weight yesterday-169lb  Last visit weight-162lb  Today I did an unscheduled visit to follow up as Thomas West was extremely hypertensive yesterday and weight was up 7lbs  @ 169lb.    I had pt weigh in my presence several times and he was 167lbs and 140/100 for his pressure today.  He denies chest pain, SOB or headache. No dizziness.  I reviewed his med box to find that he did not take his noon dose of Hydralazine yesterday or today.  I had him take today's dose while I was there with him.  I continue to discuss with him the importance of medication compliance at each visit.  I explain to him that the efficacy of his meds cannot be measured accurately unless he is taking them as prescribed.   [facility] that I will attend with him.  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   Pruritic rash 05/25/2022   Polyneuropathy associated with underlying disease (HCC) 01/15/2022   Atherosclerosis of aorta (HCC) 01/15/2022   History of CVA (cerebrovascular accident) without residual deficits 12/24/2021   AKI (acute kidney injury) (HCC) 12/13/2021   Diarrhea 12/13/2021   Shortness of breath 12/13/2021   Chest pain 12/13/2021   Nausea and vomiting 12/13/2021   Heart failure (HCC) 12/08/2021   Hypertension  associated with diabetes (HCC) 07/12/2021   Chronic kidney disease, stage 3a (HCC) 07/12/2021   Hyperkalemia 07/12/2021   CHF exacerbation (HCC) 04/20/2021   HFrEF (heart failure with reduced ejection fraction) (HCC) 02/03/2021   Diabetes mellitus (HCC) 03/27/2019   Hyperlipidemia associated with type 2 diabetes mellitus (HCC) 03/27/2019   CAD (coronary artery disease) 03/05/2019   History of MI (myocardial infarction) 03/05/2019   Type 2 diabetes mellitus with diabetic neuropathy, unspecified (HCC) 03/05/2019   HLD (hyperlipidemia) 03/05/2019   GERD (gastroesophageal reflux disease) 03/05/2019   Hypertension, essential, benign 03/05/2019    Current Outpatient Medications:    aspirin EC 81 MG tablet, Take 1 tablet (81 mg total) by mouth daily. Swallow whole., Disp: 30 tablet, Rfl: 11   atorvastatin (LIPITOR) 80 MG tablet, Take 1 tablet (80 mg total) by mouth daily., Disp: 90 tablet, Rfl: 3   carvedilol (COREG) 6.25 MG tablet, Take 1 tablet (6.25 mg total) by mouth 2 (two) times daily., Disp: 180 tablet, Rfl: 3   clopidogrel (PLAVIX) 75 MG tablet, Take 1 tablet (75 mg total) by mouth daily., Disp: 90 tablet, Rfl: 0   cyanocobalamin (VITAMIN B12) 1000 MCG tablet, TAKE ONE TABLET BY MOUTH DAILY, Disp: 30 tablet, Rfl: 3   DULoxetine (CYMBALTA) 30 MG capsule, Take 1 capsule (30 mg total) by mouth daily., Disp: 30 capsule, Rfl: 3   empagliflozin (JARDIANCE) 25 MG TABS tablet, Take 1 tablet (25 mg total) by mouth daily before breakfast., Disp:  90 tablet, Rfl: 1   ezetimibe (ZETIA) 10 MG tablet, Take 1 tablet (10 mg total) by mouth daily., Disp: 30 tablet, Rfl: 3   hydrALAZINE (APRESOLINE) 50 MG tablet, Take 2 tablets (100 mg total) by mouth 3 (three) times daily., Disp: 180 tablet, Rfl: 11   hydrocortisone cream 1 %, Apply 1 Application topically 2 (two) times daily as needed for itching., Disp: 45 g, Rfl: 1   isosorbide mononitrate (IMDUR) 30 MG 24 hr tablet, Take 1 tablet (30 mg total) by mouth  daily., Disp: 30 tablet, Rfl: 11   linagliptin (TRADJENTA) 5 MG TABS tablet, Take 1 tablet (5 mg total) by mouth daily., Disp: 30 tablet, Rfl: 3   sacubitril-valsartan (ENTRESTO) 97-103 MG, Take 1 tablet by mouth 2 (two) times daily., Disp: 60 tablet, Rfl: 3   spironolactone (ALDACTONE) 25 MG tablet, Take 1 tablet (25 mg total) by mouth daily., Disp: 30 tablet, Rfl: 11   spironolactone (ALDACTONE) 25 MG tablet, Take 1 tablet (25 mg total) by mouth daily. (Patient not taking: Reported on 03/02/2023), Disp: 30 tablet, Rfl: 4   Tafamidis (VYNDAMAX) 61 MG CAPS, Take 1 capsule by mouth daily., Disp: 30 capsule, Rfl: 11   torsemide (DEMADEX) 20 MG tablet, Take 20 mg by mouth daily. Take 10mg  on Monday, Wednesday, Friday. Take 20mg  all other days., Disp: , Rfl:  Allergies  Allergen Reactions   Bee Venom Anaphylaxis, Swelling and Other (See Comments)    Swells all over     Social History   Socioeconomic History   Marital status: Single    Spouse name: Not on file   Number of children: Not on file   Years of education: Not on file   Highest education level: Not on file  Occupational History   Not on file  Tobacco Use   Smoking status: Former    Current packs/day: 0.00    Types: Cigarettes    Quit date: 10/06/2017    Years since quitting: 5.4   Smokeless tobacco: Never  Substance and Sexual Activity   Alcohol use: Yes    Comment: Occasional Beer   Drug use: Yes    Types: Marijuana   Sexual activity: Not on file  Other Topics Concern   Not on file  Social History Narrative   Right Handed.    Lives in a two story home    Lives with 38 year old son.    Social Determinants of Health   Financial Resource Strain: Low Risk  (01/18/2022)   Overall Financial Resource Strain (CARDIA)    Difficulty of Paying Living Expenses: Not hard at all  Food Insecurity: No Food Insecurity (09/21/2022)   Hunger Vital Sign    Worried About Running Out of Food in the Last Year: Never true    Ran Out of  Food in the Last Year: Never true  Transportation Needs: No Transportation Needs (06/04/2022)   PRAPARE - Administrator, Civil Service (Medical): No    Lack of Transportation (Non-Medical): No  Physical Activity: Inactive (01/18/2022)   Exercise Vital Sign    Days of Exercise per Week: 0 days    Minutes of Exercise per Session: 0 min  Stress: Stress Concern Present (01/18/2022)   [employer] of Occupational Health - Occupational Stress Questionnaire    Feeling of Stress : To some extent  Social Connections: Socially Isolated (01/18/2022)   Social Connection and Isolation Panel [NHANES]    Frequency of Communication with Friends and Family: More than  three times a week    Frequency of Social Gatherings with Friends and Family: More than three times a week    Attends Religious Services: Never    Database administrator or Organizations: No    Attends Banker Meetings: Never    Marital Status: Never married  Intimate Partner Violence: Not At Risk (01/18/2022)   Humiliation, Afraid, Rape, and Kick questionnaire    Fear of Current or Ex-Partner: No    Emotionally Abused: No    Physically Abused: No    Sexually Abused: No    Physical Exam      Future Appointments  Date Time Provider Department Center  03/16/2023 11:00 AM [facility] PA/NP [facility] None

## 2023-03-10 NOTE — Telephone Encounter (Signed)
Per [facility]  Pt reports he is taking Reports NOT TAKING is listed as a duplicate  Weight down today x 4lb per pt  -per Lillia Abed Finch<NP No changes for now-reeducate importance of taking medications daily   Para medicine will f/u with recheck 10/17  Will provide updated vitals

## 2023-03-10 NOTE — Telephone Encounter (Signed)
Is he taking the spiro 25 mg daily. There are 2 orders, one is listed as not taking.

## 2023-03-10 NOTE — Progress Notes (Deleted)
03/10/2023 Name: Thomas West MRN: [id number] DOB: 1960/09/22  No chief complaint on file.   Thomas West is a 62 y.o. year old male who presented for a telephone visit.   They were referred to the pharmacist by their PCP for assistance in managing diabetes, hypertension, and complex medication management.    Subjective:  Care Team: Primary Care Provider: [name], [name], MD   Medication Access/Adherence  Current Pharmacy:  [facility] [address] [place] [address] Phone: [phone] Fax: [phone]  [facility] - [place], [place] - [address] [facility] [address] [place] [address] Phone: [phone] Fax: [phone]  [facility] - [place], [place] - [address] [place] [address] Phone: [phone] Fax: [phone]   Patient reports affordability concerns with their medications: {YES/NO:21197} Patient reports access/transportation concerns to their pharmacy: {YES/NO:21197} Patient reports adherence concerns with their medications:  {YES/NO:21197} ***   Diabetes:  Current medications: Tradjenta 5mg , Jardiance 25mg  Medications tried in the past: Glipizide, Novolog, Metformin, Januvia  Current glucose readings: *** Using *** meter; testing *** times daily  Date of Download: *** % Time CGM is active: ***% Average Glucose: *** mg/dL Glucose Management Indicator: ***  Glucose Variability: *** (goal <36%) Time in Goal:  - Time in range 70-180: ***% - Time above range: ***% - Time below range: ***% Observed patterns:  Patient {Actions; denies-reports:120008} hypoglycemic s/sx including ***dizziness, shakiness, sweating. Patient {Actions; denies-reports:120008} hyperglycemic symptoms including ***polyuria, polydipsia, polyphagia, nocturia, neuropathy, blurred vision.  Current meal patterns:  - Breakfast: *** - Lunch *** - Supper *** - Snacks  *** - Drinks ***  Current physical activity: ***  Current medication access support: ***  Hypertension:  Current medications: Carvedilol 6.25mg  BID, Hydralazine 50mg  2 tabs TID, Entresto 97-103mg  BID, Spironolactone 25mg  every day, Torsemide 20mg  1/2 tab MWF, full tab all other days Medications previously tried: Metoprolol  Patient {HAS/DOES NOT LFYB:01751} a validated, automated, upper arm home BP cuff Current blood pressure readings readings: ***  Patient {Actions; denies-reports:120008} hypotensive s/sx including ***dizziness, lightheadedness.  Patient {Actions; denies-reports:120008} hypertensive symptoms including ***headache, chest pain, shortness of breath  Current meal patterns: ***  Current physical activity: ***   Objective:  Lab Results  Component Value Date   HGBA1C 8.1 (H) 09/24/2022    Lab Results  Component Value Date   CREATININE 1.27 (H) 02/02/2023   BUN 14 02/02/2023   NA 140 02/02/2023   K 5.0 02/02/2023   CL 107 02/02/2023   CO2 27 02/02/2023    Lab Results  Component Value Date   CHOL 153 09/24/2022   HDL 43.20 09/24/2022   LDLCALC 90 09/24/2022   TRIG 102.0 09/24/2022   CHOLHDL 4 09/24/2022    Medications Reviewed Today   Medications were not reviewed in this encounter       Assessment/Plan:   Diabetes: - Currently {CHL Controlled/Uncontrolled:[phone]} - Reviewed long term cardiovascular and renal outcomes of uncontrolled blood sugar - Reviewed goal A1c, goal fasting, and goal 2 hour post prandial glucose - Reviewed dietary modifications including *** - Reviewed lifestyle modifications including: - Recommend to ***  - Recommend to check glucose *** - Meets financial criteria for *** patient assistance program through ***. Will collaborate with provider, CPhT, and patient to pursue assistance.      Hypertension: - Currently {CHL Controlled/Uncontrolled:[phone]} - Reviewed long term cardiovascular and renal outcomes of  uncontrolled blood pressure -  Reviewed appropriate blood pressure monitoring technique and reviewed goal blood pressure. Recommended to check home blood pressure and heart rate *** - Recommend to ***     Follow Up Plan: ***  Sherrill Raring, PharmD Clinical Pharmacist [phone]

## 2023-03-15 ENCOUNTER — Telehealth (HOSPITAL_COMMUNITY): Payer: Self-pay

## 2023-03-15 NOTE — Telephone Encounter (Signed)
Called to confirm/remind patient of their appointment [facility] on 03[facility]16/[facility].   Patient reminded to bring all medications and/or complete list.  Confirmed patient has transportation. Gave directions, instructed to utilize valet parking.  Confirmed appointment prior to ending call.

## 2023-03-15 NOTE — Progress Notes (Signed)
Advanced Heart Failure Clinic Note   Primary Care: Dr. Betty [name] HF Cardiologist: Dr. Gala Romney  HPI: [name] is a 62 y.o. male with history of CAD with prior MI and stent in 01/2019 at [facility] in [place] (report not available), cardiomyopathy/chronic systolic HF, uncontrolled DM II, HTN, hx CVA on chart review, CKD.    Moved to [place] from [place], [place] 2 years ago.   Had not medical f/u until he was admitted in 10/22 with a/c systolic HF. Had been out of cardiac medications. Diuresed with IV lasix then transitioned to po lasix 40 mg daily. Started on GDMT with carvedilol, hydralazine, imdur and empagliflozin.    Echo 10/22 with EF 20-25%, RV okay, trivial MR   [facility] 10/22 with nonobstructive CAD and patent RPLV stent. RA mean 5 mmHg, PCWP mean 5 mmHg, Fick CO 7.46/CI 3.85.   He did not show up for [facility] appointment after discharge.   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  Admitted 12/22 with a/c CHF due to noncompliance with GDMT. cMRI strongly suggestive of amyloidosis, GDMT titrated. Admitted 2/23 with a/c CHF after running out of meds x 1 week. Given IV lasix, GDMT restarted. Imdur held with low BP. Seen in ED 3/23 with back pain, felt to be related to MSK. Seen in ED 08/18/21 with SOB, cardiac work up unrevealing.  Admitted 12/07/21-12/10/21 post fall w/ a/c CHF. Diuresed well with IV lasix/metolazone and discharged. He developed an AKI Cr >> 1.2>>1.75, Entresto and spiro were held and he was instructed to restart the next day following discharge. He was seen by paramedicine the following day and he was in no acute distress, taking all meds. Denied CP, SOB and overall feeling well.   Readmitted 12/12/21 w/ 2 days of persistent N/V/D with intermittent CP and SOB. Presented with AKI cr 3.3 2/2 emesis/diarrhea. Responded well to 500 mL NS bolus and  holding of Entresto and spiro. Restarted meds once discharged.   Admitted 8/23 with a/c CHF. Diuresed with IV lasix. Hospitalization c/b with AKI, GDMT initially held; Entresto resumed at low dose, torsemide on MWF. Discharged home, weight 145 lbs.  Echo 02/02/23 EF 45%   Today he returns for HF follow up. Overall feeling fine. He is able to do ADLs and walk up steps without SOB, limited mostly by neuropathy in feet. Denies palpitations, CP, dizziness, edema, or PND/Orthopnea. Appetite ok. No fever or chills. Weight at home 162-164 pounds. Taking all medications. Followed by paramedicine.  Cardiac Studies: - Echo 02/02/23 EF 45%   - Echo (6/23): EF 30-35%, LV global hypokinesis, Grade 1 DD, RV function normal  - PYP (12/22): suggestive to TTR amyloid and may benefit from addition of tafamadis as outpatient.   - cMRI (11/22): LVEF 24% RVEF 21% LGE and ECW suggestive of cardiac amyloidosis  ROS: All systems reviewed and negative except as per HPI.   Past Medical History:  Diagnosis Date   CHF (congestive heart failure) (HCC)    Coronary artery disease    Diabetes mellitus without complication (HCC)    History of MI (myocardial infarction) 03/05/2019   Hypertension    Stroke [facility])    Current Outpatient Medications  Medication Sig Dispense Refill   aspirin EC 81 MG tablet Take 1 tablet (81 mg total) by mouth daily. Swallow whole. 30 tablet 11   atorvastatin (LIPITOR) 80 MG tablet Take  1 tablet (80 mg total) by mouth daily. 90 tablet 3   carvedilol (COREG) 6.25 MG tablet Take 1 tablet (6.25 mg total) by mouth 2 (two) times daily. 180 tablet 3   clopidogrel (PLAVIX) 75 MG tablet Take 1 tablet (75 mg total) by mouth daily. 90 tablet 0   cyanocobalamin (VITAMIN B12) 1000 MCG tablet TAKE ONE TABLET BY MOUTH DAILY 30 tablet 3   DULoxetine (CYMBALTA) 30 MG capsule Take 1 capsule (30 mg total) by mouth daily. 30 capsule 3   empagliflozin (JARDIANCE) 25 MG TABS tablet Take 1 tablet (25 mg total)  by mouth daily before breakfast. 90 tablet 1   ezetimibe (ZETIA) 10 MG tablet Take 1 tablet (10 mg total) by mouth daily. 30 tablet 3   hydrALAZINE (APRESOLINE) 50 MG tablet Take 2 tablets (100 mg total) by mouth 3 (three) times daily. 180 tablet 11   hydrocortisone cream 1 % Apply 1 Application topically 2 (two) times daily as needed for itching. 45 g 1   isosorbide mononitrate (IMDUR) 30 MG 24 hr tablet Take 1 tablet (30 mg total) by mouth daily. 30 tablet 11   linagliptin (TRADJENTA) 5 MG TABS tablet Take 1 tablet (5 mg total) by mouth daily. 30 tablet 3   sacubitril-valsartan (ENTRESTO) 97-103 MG Take 1 tablet by mouth 2 (two) times daily. 60 tablet 3   spironolactone (ALDACTONE) 25 MG tablet Take 1 tablet (25 mg total) by mouth daily. 30 tablet 11   spironolactone (ALDACTONE) 25 MG tablet Take 1 tablet (25 mg total) by mouth daily. 30 tablet 4   Tafamidis (VYNDAMAX) 61 MG CAPS Take 1 capsule by mouth daily. 30 capsule 11   torsemide (DEMADEX) 20 MG tablet Take 20 mg by mouth daily. Take 10mg  on Monday, Wednesday, Friday. Take 20mg  all other days.     No current facility-administered medications for this encounter.   Allergies  Allergen Reactions   Bee Venom Anaphylaxis, Swelling and Other (See Comments)    Swells all over   Social History   Socioeconomic History   Marital status: Single    Spouse name: Not on file   Number of children: Not on file   Years of education: Not on file   Highest education level: Not on file  Occupational History   Not on file  Tobacco Use   Smoking status: Former    Current packs/day: 0.00    Types: Cigarettes    Quit date: 10/06/2017    Years since quitting: 5.4   Smokeless tobacco: Never  Substance and Sexual Activity   Alcohol use: Yes    Comment: Occasional Beer   Drug use: Yes    Types: Marijuana   Sexual activity: Not on file  Other Topics Concern   Not on file  Social History Narrative   Right Handed.    Lives in a two story home     Lives with 19 year old son.    Social Determinants of Health   Financial Resource Strain: Low Risk  (01/18/2022)   Overall Financial Resource Strain (CARDIA)    Difficulty of Paying Living Expenses: Not hard at all  Food Insecurity: No Food Insecurity (09/21/2022)   Hunger Vital Sign    Worried About Running Out of Food in the Last Year: Never true    Ran Out of Food in the Last Year: Never true  Transportation Needs: No Transportation Needs (06/04/2022)   PRAPARE - Administrator, Civil Service (Medical): No  Lack of Transportation (Non-Medical): No  Physical Activity: Inactive (01/18/2022)   Exercise Vital Sign    Days of Exercise per Week: 0 days    Minutes of Exercise per Session: 0 min  Stress: Stress Concern Present (01/18/2022)   [employer] of Occupational Health - Occupational Stress Questionnaire    Feeling of Stress : To some extent  Social Connections: Socially Isolated (01/18/2022)   Social Connection and Isolation Panel [NHANES]    Frequency of Communication with Friends and Family: More than three times a week    Frequency of Social Gatherings with Friends and Family: More than three times a week    Attends Religious Services: Never    Database administrator or Organizations: No    Attends Banker Meetings: Never    Marital Status: Never married  Intimate Partner Violence: Not At Risk (01/18/2022)   Humiliation, Afraid, Rape, and Kick questionnaire    Fear of Current or Ex-Partner: No    Emotionally Abused: No    Physically Abused: No    Sexually Abused: No   Family History  Problem Relation Age of Onset   Diabetes Mother    Kidney failure Mother    BP (!) 152/98   Pulse (!) 56   Wt 75 kg (165 lb 6.4 oz)   SpO2 94%   BMI 24.43 kg/m   Wt Readings from Last 3 Encounters:  03/16/23 75 kg (165 lb 6.4 oz)  03/10/23 75.8 kg (167 lb)  03/09/23 76.7 kg (169 lb 3.2 oz)   PHYSICAL EXAM: General:  NAD. No resp difficulty, arrived  in [facility] HEENT: Normal Neck: Supple. No JVD. Carotids 2+ bilat; no bruits. No lymphadenopathy or thryomegaly appreciated. Cor: PMI nondisplaced. Regular rate & rhythm. No rubs, gallops or murmurs. Lungs: Clear Abdomen: Soft, nontender, nondistended. No hepatosplenomegaly. No bruits or masses. Good bowel sounds. Extremities: No cyanosis, clubbing, rash, edema Neuro: Alert & oriented x 3, cranial nerves grossly intact. Moves all 4 extremities w/o difficulty. Affect pleasant.  ECG (personally reviewed): NSR 85 bpm  ASSESSMENT & PLAN: Chronic Systolic HF/Cardiac amyloidosis - Onset not certain. He reports cardiomyopathy diagnosed just prior to MI while living in [place] a couple of years ago. - Echo (10/22): EF 20-25%, RV ok - R/LHC (10/22): Patent RPLV stent with nonobstructive disease elsewhere, Preserved CO/CI, RA mean 5 mmHg, PCWP mean 5 mmHg - cMRI (11/22): LVEF 24% RVEF 21% LGE and ECW suggestive of cardiac amyloidosis. 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 - Multiple myeloma panel (12/22) with no m-spike. 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 Volume OK - Continue torsemide 20 mg daily alternating with 10 mg every other day - Continue hydralazine 100 mg tid and increase Imdur to 45 mg daily.  - Continue Entresto 97/103 mg bid. - Continue Jardiance 25 mg daily. No GU symptoms. - Continue carvedilol 6.25 mg bid.  - Continue spironolactone 25 mg daily.   - Continue Tafamadis.  - Consider addition of silencer when approved for non-familial - Labs today.  2. wtTTR cardiac amyloidosis - Continue tafamadis. - Has seen Dr. Tyson Alias  - Consider addition of silencer when approved for non-familial  3. CAD - Prior MI followed by PCI in [place] in either 2019 or 2020.  - Patent RPLV stent on [facility] 10/22. Also had 20%  distal RCA, 30% p  LAD, 70% d LAD, 60% OM2 - Stent placed 2020. >48yr since stent placement, could consider stopping Plavix d/t issues with noncompliance. - No s/s angina  - Continue ASA.  - Continue atorvastatin 80 mg daily.   4. HTN - Blood pressure better but still elevated - Increase Imdur as above  5. Uncontrolled DM - A1c down to 7.3 from 12. - Continue Jardiance.  6. Neuropathy - Unclear if due to DM2 or TTR - Saw Dr. Allena Katz in Neurology as above, with wild type TTR, current therapies not approved. - Continue supportive care.  7. CKD stage IIIb - Baseline SCr 1.7-2.0 - Continue Jardiance - BMET today.   8. Hx fall - CT: L-spine on 12/08/2021 was negative for acute fracture or subluxation of the L-spine. - No further falls. Neurology has arranged balance PT.  9. SDOH - He has Medicaid and disability. - Transportation is an issue, he has a car but unable to drive (needs license). - 69 year old son helps with meds. - Paramedicine follows, appreciated their assistance.  Follow up in 4 months with Dr. Gala Romney  Thomas Ganong, FNP  10:27 AM

## 2023-03-16 ENCOUNTER — Encounter (HOSPITAL_COMMUNITY): Payer: Self-pay

## 2023-03-16 ENCOUNTER — Ambulatory Visit (HOSPITAL_COMMUNITY)
Admission: RE | Admit: 2023-03-16 | Discharge: 2023-03-16 | Disposition: A | Discharge status: Home / Self Care | Payer: No Typology Code available for payment source | Source: Ambulatory Visit | Attending: Family Medicine | Admitting: Family Medicine

## 2023-03-16 ENCOUNTER — Other Ambulatory Visit (HOSPITAL_COMMUNITY): Payer: Self-pay | Admitting: Emergency Medicine

## 2023-03-16 ENCOUNTER — Other Ambulatory Visit: Payer: Self-pay

## 2023-03-16 ENCOUNTER — Other Ambulatory Visit (HOSPITAL_COMMUNITY): Payer: Self-pay

## 2023-03-16 VITALS — BP 152/98 | HR 56 | Wt 165.4 lb

## 2023-03-16 DIAGNOSIS — Z87891 Personal history of nicotine dependence: Secondary | ICD-10-CM | POA: Diagnosis not present

## 2023-03-16 DIAGNOSIS — I130 Hypertensive heart and chronic kidney disease with heart failure and stage 1 through stage 4 chronic kidney disease, or unspecified chronic kidney disease: Secondary | ICD-10-CM | POA: Diagnosis not present

## 2023-03-16 DIAGNOSIS — E854 Organ-limited amyloidosis: Secondary | ICD-10-CM

## 2023-03-16 DIAGNOSIS — Z8673 Personal history of transient ischemic attack (TIA), and cerebral infarction without residual deficits: Secondary | ICD-10-CM | POA: Diagnosis not present

## 2023-03-16 DIAGNOSIS — Z7982 Long term (current) use of aspirin: Secondary | ICD-10-CM | POA: Diagnosis not present

## 2023-03-16 DIAGNOSIS — Z9181 History of falling: Secondary | ICD-10-CM

## 2023-03-16 DIAGNOSIS — N183 Chronic kidney disease, stage 3 unspecified: Secondary | ICD-10-CM | POA: Diagnosis not present

## 2023-03-16 DIAGNOSIS — I2510 Atherosclerotic heart disease of native coronary artery without angina pectoris: Secondary | ICD-10-CM

## 2023-03-16 DIAGNOSIS — Z7902 Long term (current) use of antithrombotics/antiplatelets: Secondary | ICD-10-CM | POA: Diagnosis not present

## 2023-03-16 DIAGNOSIS — I10 Essential (primary) hypertension: Secondary | ICD-10-CM | POA: Diagnosis not present

## 2023-03-16 DIAGNOSIS — I5022 Chronic systolic (congestive) heart failure: Secondary | ICD-10-CM | POA: Insufficient documentation

## 2023-03-16 DIAGNOSIS — Z7984 Long term (current) use of oral hypoglycemic drugs: Secondary | ICD-10-CM | POA: Diagnosis not present

## 2023-03-16 DIAGNOSIS — I43 Cardiomyopathy in diseases classified elsewhere: Secondary | ICD-10-CM | POA: Diagnosis not present

## 2023-03-16 DIAGNOSIS — G629 Polyneuropathy, unspecified: Secondary | ICD-10-CM

## 2023-03-16 DIAGNOSIS — I252 Old myocardial infarction: Secondary | ICD-10-CM | POA: Insufficient documentation

## 2023-03-16 DIAGNOSIS — Z139 Encounter for screening, unspecified: Secondary | ICD-10-CM

## 2023-03-16 DIAGNOSIS — E11610 Type 2 diabetes mellitus with diabetic neuropathic arthropathy: Secondary | ICD-10-CM

## 2023-03-16 DIAGNOSIS — Z79899 Other long term (current) drug therapy: Secondary | ICD-10-CM | POA: Insufficient documentation

## 2023-03-16 DIAGNOSIS — I5020 Unspecified systolic (congestive) heart failure: Secondary | ICD-10-CM | POA: Diagnosis not present

## 2023-03-16 DIAGNOSIS — E1165 Type 2 diabetes mellitus with hyperglycemia: Secondary | ICD-10-CM | POA: Diagnosis not present

## 2023-03-16 DIAGNOSIS — I493 Ventricular premature depolarization: Secondary | ICD-10-CM | POA: Insufficient documentation

## 2023-03-16 DIAGNOSIS — E1140 Type 2 diabetes mellitus with diabetic neuropathy, unspecified: Secondary | ICD-10-CM | POA: Insufficient documentation

## 2023-03-16 DIAGNOSIS — E1122 Type 2 diabetes mellitus with diabetic chronic kidney disease: Secondary | ICD-10-CM | POA: Insufficient documentation

## 2023-03-16 DIAGNOSIS — N1832 Chronic kidney disease, stage 3b: Secondary | ICD-10-CM | POA: Diagnosis not present

## 2023-03-16 LAB — BASIC METABOLIC PANEL
Anion gap: 9 (ref 5–15)
BUN: 22 mg/dL — ABNORMAL HIGH (ref 6–20)
CO2: 27 mmol/L (ref 22–32)
Calcium: 9 mg/dL (ref 8.9–10.3)
Chloride: 103 mmol/L (ref 98–111)
Creatinine, Ser: 1.8 mg/dL — ABNORMAL HIGH (ref 0.61–1.24)
GFR, Estimated: 43 mL/min — ABNORMAL LOW (ref >60)
Glucose, Bld: 129 mg/dL — ABNORMAL HIGH (ref 70–99)
Potassium: 4.6 mmol/L (ref 3.5–5.1)
Sodium: 139 mmol/L (ref 135–145)

## 2023-03-16 LAB — BRAIN NATRIURETIC PEPTIDE: B Natriuretic Peptide: 87.5 pg/mL (ref 0.0–100.0)

## 2023-03-16 MED ORDER — ISOSORBIDE MONONITRATE ER 30 MG PO TB24
45.0000 mg | ORAL_TABLET | Freq: Every day | ORAL | 3 refills | Status: DC
  Filled 2023-03-16: qty 135, 90d supply, fill #0

## 2023-03-16 NOTE — Patient Instructions (Signed)
Increase Imdur to 45 mg (1 and 1/2 tablet) daily. Labs today - will call you if abnormal. Return to see Dr. Gala Romney in 4 months. Please call us at [phone] in January to schedule this appointment. Please call us at [phone] if any questions or concerns prior to your next visit.

## 2023-03-16 NOTE — Progress Notes (Signed)
Sent message to Mr. Athan this morning to remind him of his appointment in clinic today.  I arrived at the clinic @ 11:00 for his visit and was advised he arrived "very early" and has already been seen and left.  No med changes needed.      Beatrix Shipper, EMT-Paramedic [phone] 03/16/2023

## 2023-03-23 ENCOUNTER — Telehealth (HOSPITAL_COMMUNITY): Payer: Self-pay | Admitting: Cardiology

## 2023-03-23 ENCOUNTER — Other Ambulatory Visit: Payer: Self-pay

## 2023-03-23 ENCOUNTER — Other Ambulatory Visit (HOSPITAL_COMMUNITY): Payer: Self-pay | Admitting: Emergency Medicine

## 2023-03-23 MED ORDER — DULOXETINE HCL 30 MG PO CPEP
30.0000 mg | ORAL_CAPSULE | Freq: Every day | ORAL | 0 refills | Status: DC
  Filled 2023-03-23: qty 30, 30d supply, fill #0

## 2023-03-23 NOTE — Progress Notes (Signed)
Paramedicine Encounter    Patient ID: Thomas West, male    DOB: 1960-12-12, 62 y.o.   MRN: [id number]   Complaints NONE  Assessment A&O x 4, skin W&D w/ good color.   No chest pain or SOB.  Lung sounds w/ ronchi bilat.  Productive cough w/ thick white secretions.    No peripheral edema noted.  Compliance with meds YES  Pill box filled   Refills needed ASA, B12, duloxetine,   Meds changes since last visit  increased Isosorbide to 45mg  daily ( 1.5 tablets daily)   Social changes NONE   BP (!) 160/80 (BP Location: Left Arm, Patient Position: Sitting, Cuff Size: Normal)   Pulse 90   Resp 16   Wt 165 lb (74.8 kg)   SpO2 94%   BMI 24.37 kg/m  Weight yesterday-Not taken Last visit weight-167lb  Thomas West reports to be feeling well today.  He was sitting in his living room eating a bowl of fruit.  He denies chest pain or SOB.  His lung sounds w/ ronchi throughout.  Productive cough with thick white secretions.  He is afebrile.  He has done well with his med compliance.   Pill box reconciled x 1 week.  Refills called in to appropriate pharmacies. Next home visit 11/6 @ 8:30.  ACTION: Home visit completed  [name] [phone] 03/23/23  Patient Care Team: [name], [name], MD as PCP - General (Family Medicine) Bensimhon, Bevelyn Buckles, MD as PCP - Cardiology (Cardiology) Glendale Chard, DO as Consulting Physician (Neurology) Sherrill Raring, [facility] (Pharmacist) Sherrill Raring, [facility] (Pharmacist)  Patient Active Problem List   Diagnosis Date Noted   Cardiac amyloidosis [facility]) 05/25/2022   Pruritic rash 05/25/2022   Polyneuropathy associated with underlying disease (HCC) 01/15/2022   Atherosclerosis of aorta (HCC) 01/15/2022   History of CVA (cerebrovascular accident) without residual deficits 12/24/2021   AKI (acute kidney injury) (HCC) 12/13/2021   Diarrhea 12/13/2021   Shortness of breath 12/13/2021   Chest pain 12/13/2021   Nausea and vomiting  12/13/2021   Heart failure (HCC) 12/08/2021   Hypertension associated with diabetes (HCC) 07/12/2021   Chronic kidney disease, stage 3a (HCC) 07/12/2021   Hyperkalemia 07/12/2021   CHF exacerbation (HCC) 04/20/2021   HFrEF (heart failure with reduced ejection fraction) (HCC) 02/03/2021   Diabetes mellitus (HCC) 03/27/2019   Hyperlipidemia associated with type 2 diabetes mellitus (HCC) 03/27/2019   CAD (coronary artery disease) 03/05/2019   History of MI (myocardial infarction) 03/05/2019   Type 2 diabetes mellitus with diabetic neuropathy, unspecified (HCC) 03/05/2019   HLD (hyperlipidemia) 03/05/2019   GERD (gastroesophageal reflux disease) 03/05/2019   Hypertension, essential, benign 03/05/2019    Current Outpatient Medications:    aspirin EC 81 MG tablet, Take 1 tablet (81 mg total) by mouth daily. Swallow whole., Disp: 30 tablet, Rfl: 11   atorvastatin (LIPITOR) 80 MG tablet, Take 1 tablet (80 mg total) by mouth daily., Disp: 90 tablet, Rfl: 3   carvedilol (COREG) 6.25 MG tablet, Take 1 tablet (6.25 mg total) by mouth 2 (two) times daily., Disp: 180 tablet, Rfl: 3   clopidogrel (PLAVIX) 75 MG tablet, Take 1 tablet (75 mg total) by mouth daily., Disp: 90 tablet, Rfl: 0   cyanocobalamin (VITAMIN B12) 1000 MCG tablet, TAKE ONE TABLET BY MOUTH DAILY, Disp: 30 tablet, Rfl: 3   DULoxetine (CYMBALTA) 30 MG capsule, Take 1 capsule (30 mg total) by mouth daily., Disp: 30 capsule, Rfl: 3   empagliflozin (JARDIANCE) 25  MG TABS tablet, Take 1 tablet (25 mg total) by mouth daily before breakfast., Disp: 90 tablet, Rfl: 1   ezetimibe (ZETIA) 10 MG tablet, Take 1 tablet (10 mg total) by mouth daily., Disp: 30 tablet, Rfl: 3   hydrALAZINE (APRESOLINE) 50 MG tablet, Take 2 tablets (100 mg total) by mouth 3 (three) times daily., Disp: 180 tablet, Rfl: 11   hydrocortisone cream 1 %, Apply 1 Application topically 2 (two) times daily as needed for itching., Disp: 45 g, Rfl: 1   isosorbide mononitrate  (IMDUR) 30 MG 24 hr tablet, Take 1.5 tablets (45 mg total) by mouth daily., Disp: 135 tablet, Rfl: 3   linagliptin (TRADJENTA) 5 MG TABS tablet, Take 1 tablet (5 mg total) by mouth daily., Disp: 30 tablet, Rfl: 3   sacubitril-valsartan (ENTRESTO) 97-103 MG, Take 1 tablet by mouth 2 (two) times daily., Disp: 60 tablet, Rfl: 3   spironolactone (ALDACTONE) 25 MG tablet, Take 1 tablet (25 mg total) by mouth daily., Disp: 30 tablet, Rfl: 11   spironolactone (ALDACTONE) 25 MG tablet, Take 1 tablet (25 mg total) by mouth daily., Disp: 30 tablet, Rfl: 4   Tafamidis (VYNDAMAX) 61 MG CAPS, Take 1 capsule by mouth daily., Disp: 30 capsule, Rfl: 11   torsemide (DEMADEX) 20 MG tablet, Take 20 mg by mouth daily. Take 10mg  on Monday, Wednesday, Friday. Take 20mg  all other days., Disp: , Rfl:  Allergies  Allergen Reactions   Bee Venom Anaphylaxis, Swelling and Other (See Comments)    Swells all over     Social History   Socioeconomic History   Marital status: Single    Spouse name: Not on file   Number of children: Not on file   Years of education: Not on file   Highest education level: Not on file  Occupational History   Not on file  Tobacco Use   Smoking status: Former    Current packs/day: 0.00    Types: Cigarettes    Quit date: 10/06/2017    Years since quitting: 5.4   Smokeless tobacco: Never  Substance and Sexual Activity   Alcohol use: Yes    Comment: Occasional Beer   Drug use: Yes    Types: Marijuana   Sexual activity: Not on file  Other Topics Concern   Not on file  Social History Narrative   Right Handed.    Lives in a two story home    Lives with 74 year old son.    Social Determinants of Health   Financial Resource Strain: Low Risk  (01/18/2022)   Overall Financial Resource Strain (CARDIA)    Difficulty of Paying Living Expenses: Not hard at all  Food Insecurity: No Food Insecurity (09/21/2022)   Hunger Vital Sign    Worried About Running Out of Food in the Last Year:  Never true    Ran Out of Food in the Last Year: Never true  Transportation Needs: No Transportation Needs (06/04/2022)   PRAPARE - Administrator, Civil Service (Medical): No    Lack of Transportation (Non-Medical): No  Physical Activity: Inactive (01/18/2022)   Exercise Vital Sign    Days of Exercise per Week: 0 days    Minutes of Exercise per Session: 0 min  Stress: Stress Concern Present (01/18/2022)   [employer] of Occupational Health - Occupational Stress Questionnaire    Feeling of Stress : To some extent  Social Connections: Socially Isolated (01/18/2022)   Social Connection and Isolation Panel [NHANES]  Frequency of Communication with Friends and Family: More than three times a week    Frequency of Social Gatherings with Friends and Family: More than three times a week    Attends Religious Services: Never    Database administrator or Organizations: No    Attends Banker Meetings: Never    Marital Status: Never married  Intimate Partner Violence: Not At Risk (01/18/2022)   Humiliation, Afraid, Rape, and Kick questionnaire    Fear of Current or Ex-Partner: No    Emotionally Abused: No    Physically Abused: No    Sexually Abused: No    Physical Exam      No future appointments.

## 2023-03-23 NOTE — Telephone Encounter (Signed)
Courtesy refill returned to pharmacy Additional refills should be addressed by pcp

## 2023-03-23 NOTE — Telephone Encounter (Signed)
-----   Message from CMA [name] S sent at 03/23/2023  3:35 PM EDT ----- Regarding: refill Needs Duloxetine refilled.  [employer]. [phone]  ThanksBeatrix Shipper, EMT-Paramedic [phone] 03/23/2023

## 2023-03-24 ENCOUNTER — Other Ambulatory Visit (HOSPITAL_COMMUNITY): Payer: Self-pay

## 2023-03-24 ENCOUNTER — Other Ambulatory Visit: Payer: Self-pay

## 2023-03-28 ENCOUNTER — Other Ambulatory Visit: Payer: Self-pay | Admitting: Family Medicine

## 2023-03-28 ENCOUNTER — Other Ambulatory Visit: Payer: Self-pay

## 2023-03-28 DIAGNOSIS — I2510 Atherosclerotic heart disease of native coronary artery without angina pectoris: Secondary | ICD-10-CM

## 2023-03-28 MED ORDER — LINAGLIPTIN 5 MG PO TABS
5.0000 mg | ORAL_TABLET | Freq: Every day | ORAL | 3 refills | Status: DC
  Filled 2023-03-28: qty 30, 30d supply, fill #0
  Filled 2023-04-26: qty 30, 30d supply, fill #1
  Filled 2023-05-20: qty 30, 30d supply, fill #2
  Filled 2023-06-20: qty 30, 30d supply, fill #3

## 2023-03-28 MED ORDER — EZETIMIBE 10 MG PO TABS
10.0000 mg | ORAL_TABLET | Freq: Every day | ORAL | 3 refills | Status: DC
  Filled 2023-03-28: qty 30, 30d supply, fill #0
  Filled 2023-04-26: qty 30, 30d supply, fill #1
  Filled 2023-05-20: qty 30, 30d supply, fill #2
  Filled 2023-06-20: qty 30, 30d supply, fill #3

## 2023-03-28 MED ORDER — CLOPIDOGREL BISULFATE 75 MG PO TABS
75.0000 mg | ORAL_TABLET | Freq: Every day | ORAL | 0 refills | Status: DC
  Filled 2023-03-28: qty 90, 90d supply, fill #0

## 2023-03-30 ENCOUNTER — Other Ambulatory Visit (HOSPITAL_BASED_OUTPATIENT_CLINIC_OR_DEPARTMENT_OTHER): Payer: Self-pay

## 2023-03-30 ENCOUNTER — Other Ambulatory Visit: Payer: Self-pay

## 2023-03-30 ENCOUNTER — Other Ambulatory Visit (HOSPITAL_COMMUNITY): Payer: Self-pay

## 2023-03-30 ENCOUNTER — Other Ambulatory Visit (HOSPITAL_COMMUNITY): Payer: Self-pay | Admitting: Emergency Medicine

## 2023-03-30 NOTE — Progress Notes (Signed)
Specialty Pharmacy Refill Coordination Note  Thomas West is a 62 y.o. male contacted today regarding refills of specialty medication(s) Tafamidis   Patient requested [name] at [facility] at [place] date: 03/30/23   Medication will be filled on 03/30/23.

## 2023-03-30 NOTE — Progress Notes (Signed)
Paramedicine Encounter    Patient ID: Thomas West, male    DOB: 16-Feb-1961, 62 y.o.   MRN: [id number]   Complaints NONE  Assessment A&O x 4, skin W&D w/ good color.  Denies chest pain or SOB.  Lung sounds mildly diminished in the bases.  Weight up 8lbs. Edema noted to lower extremities..   Compliance with meds YES  Pill box filled x 1 week  Refills needed hydralazine, vyndamax ASA, B12  Meds changes since last visit none    Social changes none   BP (!) 150/80 (BP Location: Left Arm, Patient Position: Sitting, Cuff Size: Normal)   Pulse 96   Resp 16   Wt 173 lb (78.5 kg)   SpO2 96%   BMI 25.55 kg/m  Weight yesterday-not taken Last visit weight-165lb   Today's visit finds Thomas West weight up 8lbs.  He has been compliant with his meds.  He states that he's been out of town for several day spending time with family celebrating his upcoming birthday.  He say's he been eating "a lot"  He denies chest pain or SOB.  BP was elevated.  I reached out to HF Triage and am waiting on possible orders.  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   Pruritic rash 05/25/2022   Polyneuropathy associated with underlying disease (HCC) 01/15/2022   Atherosclerosis of aorta (HCC) 01/15/2022   History of CVA (cerebrovascular accident) without residual deficits 12/24/2021   AKI (acute kidney injury) (HCC) 12/13/2021   Diarrhea 12/13/2021   Shortness of breath 12/13/2021   Chest pain 12/13/2021   Nausea and vomiting 12/13/2021   Heart failure (HCC) 12/08/2021   Hypertension associated with diabetes  (HCC) 07/12/2021   Chronic kidney disease, stage 3a (HCC) 07/12/2021   Hyperkalemia 07/12/2021   CHF exacerbation (HCC) 04/20/2021   HFrEF (heart failure with reduced ejection fraction) (HCC) 02/03/2021   Diabetes mellitus (HCC) 03/27/2019   Hyperlipidemia associated with type 2 diabetes mellitus (HCC) 03/27/2019   CAD (coronary artery disease) 03/05/2019   History of MI (myocardial infarction) 03/05/2019   Type 2 diabetes mellitus with diabetic neuropathy, unspecified (HCC) 03/05/2019   HLD (hyperlipidemia) 03/05/2019   GERD (gastroesophageal reflux disease) 03/05/2019   Hypertension, essential, benign 03/05/2019    Current Outpatient Medications:    aspirin EC 81 MG tablet, Take 1 tablet (81 mg total) by mouth daily. Swallow whole., Disp: 30 tablet, Rfl: 11   atorvastatin (LIPITOR) 80 MG tablet, Take 1 tablet (80 mg total) by mouth daily., Disp: 90 tablet, Rfl: 3   carvedilol (COREG) 6.25 MG tablet, Take 1 tablet (6.25 mg total) by mouth 2 (two) times daily., Disp: 180 tablet, Rfl: 3   clopidogrel (PLAVIX) 75 MG tablet, Take 1 tablet (75 mg total) by mouth daily., Disp: 90 tablet, Rfl: 0   cyanocobalamin (VITAMIN B12) 1000 MCG tablet, TAKE ONE TABLET BY MOUTH DAILY, Disp: 30 tablet, Rfl: 3   DULoxetine (CYMBALTA) 30 MG capsule, Take 1 capsule (30 mg total) by mouth daily., Disp: 30 capsule, Rfl: 0   empagliflozin (JARDIANCE) 25 MG TABS tablet, Take 1 tablet (25 mg total) by mouth daily before breakfast., Disp: 90  tablet, Rfl: 1   ezetimibe (ZETIA) 10 MG tablet, Take 1 tablet (10 mg total) by mouth daily., Disp: 30 tablet, Rfl: 3   hydrALAZINE (APRESOLINE) 50 MG tablet, Take 2 tablets (100 mg total) by mouth 3 (three) times daily., Disp: 180 tablet, Rfl: 11   hydrocortisone cream 1 %, Apply 1 Application topically 2 (two) times daily as needed for itching., Disp: 45 g, Rfl: 1   isosorbide mononitrate (IMDUR) 30 MG 24 hr tablet, Take 1.5 tablets (45 mg total) by mouth daily., Disp: 135  tablet, Rfl: 3   linagliptin (TRADJENTA) 5 MG TABS tablet, Take 1 tablet (5 mg total) by mouth daily., Disp: 30 tablet, Rfl: 3   sacubitril-valsartan (ENTRESTO) 97-103 MG, Take 1 tablet by mouth 2 (two) times daily., Disp: 60 tablet, Rfl: 3   spironolactone (ALDACTONE) 25 MG tablet, Take 1 tablet (25 mg total) by mouth daily., Disp: 30 tablet, Rfl: 11   Tafamidis (VYNDAMAX) 61 MG CAPS, Take 1 capsule by mouth daily., Disp: 30 capsule, Rfl: 11   torsemide (DEMADEX) 20 MG tablet, Take 20 mg by mouth daily. Take 10mg  on Monday, Wednesday, Friday. Take 20mg  all other days., Disp: , Rfl:    spironolactone (ALDACTONE) 25 MG tablet, Take 1 tablet (25 mg total) by mouth daily. (Patient not taking: Reported on 03/30/2023), Disp: 30 tablet, Rfl: 4 Allergies  Allergen Reactions   Bee Venom Anaphylaxis, Swelling and Other (See Comments)    Swells all over     Social History   Socioeconomic History   Marital status: Single    Spouse name: Not on file   Number of children: Not on file   Years of education: Not on file   Highest education level: Not on file  Occupational History   Not on file  Tobacco Use   Smoking status: Former    Current packs/day: 0.00    Types: Cigarettes    Quit date: 10/06/2017    Years since quitting: 5.4   Smokeless tobacco: Never  Substance and Sexual Activity   Alcohol use: Yes    Comment: Occasional Beer   Drug use: Yes    Types: Marijuana   Sexual activity: Not on file  Other Topics Concern   Not on file  Social History Narrative   Right Handed.    Lives in a two story home    Lives with 94 year old son.    Social Determinants of Health   Financial Resource Strain: Low Risk  (01/18/2022)   Overall Financial Resource Strain (CARDIA)    Difficulty of Paying Living Expenses: Not hard at all  Food Insecurity: No Food Insecurity (09/21/2022)   Hunger Vital Sign    Worried About Running Out of Food in the Last Year: Never true    Ran Out of Food in the Last  Year: Never true  Transportation Needs: No Transportation Needs (06/04/2022)   PRAPARE - Administrator, Civil Service (Medical): No    Lack of Transportation (Non-Medical): No  Physical Activity: Inactive (01/18/2022)   Exercise Vital Sign    Days of Exercise per Week: 0 days    Minutes of Exercise per Session: 0 min  Stress: Stress Concern Present (01/18/2022)   [employer] of Occupational Health - Occupational Stress Questionnaire    Feeling of Stress : To some extent  Social Connections: Socially Isolated (01/18/2022)   Social Connection and Isolation Panel [NHANES]    Frequency of Communication with Friends and Family: More than three  times a week    Frequency of Social Gatherings with Friends and Family: More than three times a week    Attends Religious Services: Never    Database administrator or Organizations: No    Attends Banker Meetings: Never    Marital Status: Never married  Intimate Partner Violence: Not At Risk (01/18/2022)   Humiliation, Afraid, Rape, and Kick questionnaire    Fear of Current or Ex-Partner: No    Emotionally Abused: No    Physically Abused: No    Sexually Abused: No    Physical Exam      No future appointments.

## 2023-03-31 ENCOUNTER — Other Ambulatory Visit (HOSPITAL_COMMUNITY): Payer: Self-pay

## 2023-03-31 ENCOUNTER — Other Ambulatory Visit (HOSPITAL_COMMUNITY): Payer: Self-pay | Admitting: Emergency Medicine

## 2023-03-31 NOTE — Progress Notes (Signed)
Picked up pt's Vyndamax this morning at [facility]. Thomas West did go by to pick this prescription up yesterday but it was not available for pick up so I dropped it by his house this afternoon and placed in his pill box.  He did miss yesterday's dose of Vyndamax.    Beatrix Shipper, EMT-Paramedic [phone] 03/31/2023

## 2023-04-05 ENCOUNTER — Other Ambulatory Visit (HOSPITAL_COMMUNITY): Payer: Self-pay | Admitting: Emergency Medicine

## 2023-04-05 ENCOUNTER — Other Ambulatory Visit (HOSPITAL_COMMUNITY): Payer: Self-pay

## 2023-04-05 ENCOUNTER — Other Ambulatory Visit (HOSPITAL_COMMUNITY): Payer: Self-pay | Admitting: Family Medicine

## 2023-04-05 NOTE — Progress Notes (Signed)
Paramedicine Encounter    Patient ID: Thomas West, male    DOB: [birth year], 62 y.o.   MRN: [id number]   Complaints NONE  Assessment A&O x 4, skin W&D w/ good color.  Denies chest pain or SOB.  No peripheral edema noted.  Compliance with meds Missed noon doses of Hydralazine 100mg . Thur/Fri/Sat and PM meds Sun/Thur/Sat  Pill box filled x 1 week  Refills needed Torsemide  Meds changes since last visit NONE    Social changes NONE   BP (!) 160/100 (BP Location: Left Arm, Patient Position: Sitting, Cuff Size: Normal)   Pulse 86   Wt 168 lb (76.2 kg)   SpO2 95%   BMI 24.81 kg/m  Weight yesterday- not taken Last visit weight-173lb  ATF [name] reporting to feeling great!  He denies chest pain or SOB and no edema noted.  He has however, had poor med compliance and his BP is elevated today.  He tells me that he has been busy celebrating his birthday w/  his family and has gotten off track.  I encouraged him to get back on track and improve his med compliance so he can have more birthdays. Med box reconciled x 1 week. Next home visit 11/19 @ 1:30.   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   Pruritic rash 05/25/2022   Polyneuropathy associated with underlying disease (HCC) 01/15/2022   Atherosclerosis of aorta (HCC) 01/15/2022   History of CVA (cerebrovascular accident) without residual deficits 12/24/2021   AKI (acute kidney injury) (HCC) 12/13/2021   Diarrhea 12/13/2021   Shortness of breath 12/13/2021   Chest pain 12/13/2021   Nausea and vomiting 12/13/2021   Heart failure (HCC)  12/08/2021   Hypertension associated with diabetes (HCC) 07/12/2021   Chronic kidney disease, stage 3a (HCC) 07/12/2021   Hyperkalemia 07/12/2021   CHF exacerbation (HCC) 04/20/2021   HFrEF (heart failure with reduced ejection fraction) (HCC) 02/03/2021   Diabetes mellitus (HCC) 03/27/2019   Hyperlipidemia associated with type 2 diabetes mellitus (HCC) 03/27/2019   CAD (coronary artery disease) 03/05/2019   History of MI (myocardial infarction) 03/05/2019   Type 2 diabetes mellitus with diabetic neuropathy, unspecified (HCC) 03/05/2019   HLD (hyperlipidemia) 03/05/2019   GERD (gastroesophageal reflux disease) 03/05/2019   Hypertension, essential, benign 03/05/2019    Current Outpatient Medications:    aspirin EC 81 MG tablet, Take 1 tablet (81 mg total) by mouth daily. Swallow whole., Disp: 30 tablet, Rfl: 11   atorvastatin (LIPITOR) 80 MG tablet, Take 1 tablet (80 mg total) by mouth daily., Disp: 90 tablet, Rfl: 3   carvedilol (COREG) 6.25 MG tablet, Take 1 tablet (6.25 mg total) by mouth 2 (two) times daily., Disp: 180 tablet, Rfl: 3   clopidogrel (PLAVIX) 75 MG tablet, Take 1 tablet (75 mg total) by mouth daily., Disp: 90 tablet, Rfl: 0   cyanocobalamin (VITAMIN B12) 1000 MCG tablet, TAKE ONE TABLET BY MOUTH DAILY, Disp: 30 tablet, Rfl: 3   DULoxetine (CYMBALTA) 30 MG capsule, Take 1 capsule (30 mg total) by mouth daily., Disp: 30 capsule, Rfl: 0   empagliflozin (JARDIANCE) 25 MG TABS tablet, Take 1 tablet (25 mg  total) by mouth daily before breakfast., Disp: 90 tablet, Rfl: 1   ezetimibe (ZETIA) 10 MG tablet, Take 1 tablet (10 mg total) by mouth daily., Disp: 30 tablet, Rfl: 3   hydrALAZINE (APRESOLINE) 50 MG tablet, Take 2 tablets (100 mg total) by mouth 3 (three) times daily., Disp: 180 tablet, Rfl: 11   hydrocortisone cream 1 %, Apply 1 Application topically 2 (two) times daily as needed for itching., Disp: 45 g, Rfl: 1   isosorbide mononitrate (IMDUR) 30 MG 24 hr tablet, Take 1.5  tablets (45 mg total) by mouth daily., Disp: 135 tablet, Rfl: 3   linagliptin (TRADJENTA) 5 MG TABS tablet, Take 1 tablet (5 mg total) by mouth daily., Disp: 30 tablet, Rfl: 3   sacubitril-valsartan (ENTRESTO) 97-103 MG, Take 1 tablet by mouth 2 (two) times daily., Disp: 60 tablet, Rfl: 3   spironolactone (ALDACTONE) 25 MG tablet, Take 1 tablet (25 mg total) by mouth daily., Disp: 30 tablet, Rfl: 11   Tafamidis (VYNDAMAX) 61 MG CAPS, Take 1 capsule by mouth daily., Disp: 30 capsule, Rfl: 11   torsemide (DEMADEX) 20 MG tablet, Take 20 mg by mouth daily. Take 10mg  on Monday, Wednesday, Friday. Take 20mg  all other days., Disp: , Rfl:    spironolactone (ALDACTONE) 25 MG tablet, Take 1 tablet (25 mg total) by mouth daily. (Patient not taking: Reported on 03/30/2023), Disp: 30 tablet, Rfl: 4 Allergies  Allergen Reactions   Bee Venom Anaphylaxis, Swelling and Other (See Comments)    Swells all over     Social History   Socioeconomic History   Marital status: Single    Spouse name: Not on file   Number of children: Not on file   Years of education: Not on file   Highest education level: Not on file  Occupational History   Not on file  Tobacco Use   Smoking status: Former    Current packs/day: 0.00    Types: Cigarettes    Quit date: 10/06/2017    Years since quitting: 5.4   Smokeless tobacco: Never  Substance and Sexual Activity   Alcohol use: Yes    Comment: Occasional Beer   Drug use: Yes    Types: Marijuana   Sexual activity: Not on file  Other Topics Concern   Not on file  Social History Narrative   Right Handed.    Lives in a two story home    Lives with 18 year old son.    Social Determinants of Health   Financial Resource Strain: Low Risk  (01/18/2022)   Overall Financial Resource Strain (CARDIA)    Difficulty of Paying Living Expenses: Not hard at all  Food Insecurity: No Food Insecurity (09/21/2022)   Hunger Vital Sign    Worried About Running Out of Food in the Last  Year: Never true    Ran Out of Food in the Last Year: Never true  Transportation Needs: No Transportation Needs (06/04/2022)   PRAPARE - Administrator, Civil Service (Medical): No    Lack of Transportation (Non-Medical): No  Physical Activity: Inactive (01/18/2022)   Exercise Vital Sign    Days of Exercise per Week: 0 days    Minutes of Exercise per Session: 0 min  Stress: Stress Concern Present (01/18/2022)   [employer] of Occupational Health - Occupational Stress Questionnaire    Feeling of Stress : To some extent  Social Connections: Socially Isolated (01/18/2022)   Social Connection and Isolation Panel [NHANES]    Frequency of  Communication with Friends and Family: More than three times a week    Frequency of Social Gatherings with Friends and Family: More than three times a week    Attends Religious Services: Never    Database administrator or Organizations: No    Attends Banker Meetings: Never    Marital Status: Never married  Intimate Partner Violence: Not At Risk (01/18/2022)   Humiliation, Afraid, Rape, and Kick questionnaire    Fear of Current or Ex-Partner: No    Emotionally Abused: No    Physically Abused: No    Sexually Abused: No    Physical Exam      No future appointments.

## 2023-04-06 ENCOUNTER — Other Ambulatory Visit: Payer: Self-pay

## 2023-04-06 ENCOUNTER — Other Ambulatory Visit (HOSPITAL_COMMUNITY): Payer: Self-pay

## 2023-04-06 MED ORDER — TORSEMIDE 20 MG PO TABS
20.0000 mg | ORAL_TABLET | ORAL | 3 refills | Status: DC
  Filled 2023-04-06: qty 30, 70d supply, fill #0

## 2023-04-12 ENCOUNTER — Other Ambulatory Visit (HOSPITAL_COMMUNITY): Payer: Self-pay | Admitting: Emergency Medicine

## 2023-04-12 NOTE — Progress Notes (Unsigned)
.Paramedicine Encounter    Patient ID: Thomas West, male    DOB: April 10, 1961, 62 y.o.   MRN: [id number]   Complaints NONE   Assessment A&O x 4, skin W&D w/ good color.  Lung sounds clear throughout w/ no peripheral edema. Denies chest pain or SOB  Compliance with meds Missing noon Hydralazine dosings and Monday p.m total dosing.  Pill box filled x 1 week  Refills needed NONE  Meds changes since last visit NONE    Social changes NONE   BP (!) 140/80 (BP Location: Left Arm, Patient Position: Sitting, Cuff Size: Normal)   Pulse 88   Resp 16   Wt 170 lb 3.2 oz (77.2 kg)   SpO2 96%   BMI 25.13 kg/m  Weight yesterday- not taken Last visit weight-168lb  ATF Mr. Brosey reporting to feeling well.  He denies chest pain or SOB.   Lung sounds clear throughout and no peripheral edema noted.  He continues to be non-compliant with his noon and evening dosings.  I continue to re-enforce the importance of med compliance. Also during this visit I noted that his refill of Torsemide was labeled w/ an incorrect dosing.  I contacted HF Triage to confirm 10mg . Torsemide M/W/F and 20mg  all other days.  This is how his med box is being reconciled. Next home visit 11/26 @ 4:15.  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   Pruritic rash 05/25/2022   Polyneuropathy associated with underlying disease (HCC) 01/15/2022   Atherosclerosis of aorta (HCC) 01/15/2022   History of CVA (cerebrovascular accident) without residual deficits 12/24/2021   AKI (acute kidney injury) (HCC) 12/13/2021   Diarrhea 12/13/2021    Shortness of breath 12/13/2021   Chest pain 12/13/2021   Nausea and vomiting 12/13/2021   Heart failure (HCC) 12/08/2021   Hypertension associated with diabetes (HCC) 07/12/2021   Chronic kidney disease, stage 3a (HCC) 07/12/2021   Hyperkalemia 07/12/2021   CHF exacerbation (HCC) 04/20/2021   HFrEF (heart failure with reduced ejection fraction) (HCC) 02/03/2021   Diabetes mellitus (HCC) 03/27/2019   Hyperlipidemia associated with type 2 diabetes mellitus (HCC) 03/27/2019   CAD (coronary artery disease) 03/05/2019   History of MI (myocardial infarction) 03/05/2019   Type 2 diabetes mellitus with diabetic neuropathy, unspecified (HCC) 03/05/2019   HLD (hyperlipidemia) 03/05/2019   GERD (gastroesophageal reflux disease) 03/05/2019   Hypertension, essential, benign 03/05/2019    Current Outpatient Medications:    atorvastatin (LIPITOR) 80 MG tablet, Take 1 tablet (80 mg total) by mouth daily., Disp: 90 tablet, Rfl: 3   carvedilol (COREG) 6.25 MG tablet, Take 1 tablet (6.25 mg total) by mouth 2 (two) times daily., Disp: 180 tablet, Rfl: 3   clopidogrel (PLAVIX) 75 MG tablet, Take 1 tablet (75 mg total) by mouth daily., Disp: 90 tablet, Rfl: 0   cyanocobalamin (VITAMIN B12) 1000 MCG tablet, TAKE ONE TABLET BY MOUTH DAILY, Disp: 30 tablet, Rfl: 3   DULoxetine (CYMBALTA) 30 MG capsule, Take 1 capsule (30 mg total) by mouth daily., Disp: 30 capsule, Rfl: 0   empagliflozin (JARDIANCE) 25 MG TABS tablet, Take 1 tablet (25 mg total) by mouth daily before breakfast.,  Disp: 90 tablet, Rfl: 1   ezetimibe (ZETIA) 10 MG tablet, Take 1 tablet (10 mg total) by mouth daily., Disp: 30 tablet, Rfl: 3   hydrALAZINE (APRESOLINE) 50 MG tablet, Take 2 tablets (100 mg total) by mouth 3 (three) times daily., Disp: 180 tablet, Rfl: 11   hydrocortisone cream 1 %, Apply 1 Application topically 2 (two) times daily as needed for itching., Disp: 45 g, Rfl: 1   isosorbide mononitrate (IMDUR) 30 MG 24 hr tablet, Take 1.5  tablets (45 mg total) by mouth daily., Disp: 135 tablet, Rfl: 3   linagliptin (TRADJENTA) 5 MG TABS tablet, Take 1 tablet (5 mg total) by mouth daily., Disp: 30 tablet, Rfl: 3   sacubitril-valsartan (ENTRESTO) 97-103 MG, Take 1 tablet by mouth 2 (two) times daily., Disp: 60 tablet, Rfl: 3   spironolactone (ALDACTONE) 25 MG tablet, Take 1 tablet (25 mg total) by mouth daily., Disp: 30 tablet, Rfl: 11   Tafamidis (VYNDAMAX) 61 MG CAPS, Take 1 capsule by mouth daily., Disp: 30 capsule, Rfl: 11   aspirin EC 81 MG tablet, Take 1 tablet (81 mg total) by mouth daily. Swallow whole., Disp: 30 tablet, Rfl: 11   spironolactone (ALDACTONE) 25 MG tablet, Take 1 tablet (25 mg total) by mouth daily. (Patient not taking: Reported on 03/30/2023), Disp: 30 tablet, Rfl: 4   torsemide (DEMADEX) 20 MG tablet, Take 20 mg by mouth daily. Take 10mg  on Monday, Wednesday, Friday. Take 20mg  all other days., Disp: , Rfl:    torsemide (DEMADEX) 20 MG tablet, Take 1 tablet (20 mg total) by mouth every Monday, Wednesday, and Friday., Disp: 30 tablet, Rfl: 3 Allergies  Allergen Reactions   Bee Venom Anaphylaxis, Swelling and Other (See Comments)    Swells all over     Social History   Socioeconomic History   Marital status: Single    Spouse name: Not on file   Number of children: Not on file   Years of education: Not on file   Highest education level: Not on file  Occupational History   Not on file  Tobacco Use   Smoking status: Former    Current packs/day: 0.00    Types: Cigarettes    Quit date: 10/06/2017    Years since quitting: 5.5   Smokeless tobacco: Never  Substance and Sexual Activity   Alcohol use: Yes    Comment: Occasional Beer   Drug use: Yes    Types: Marijuana   Sexual activity: Not on file  Other Topics Concern   Not on file  Social History Narrative   Right Handed.    Lives in a two story home    Lives with 67 year old son.    Social Determinants of Health   Financial Resource Strain:  Low Risk  (01/18/2022)   Overall Financial Resource Strain (CARDIA)    Difficulty of Paying Living Expenses: Not hard at all  Food Insecurity: No Food Insecurity (09/21/2022)   Hunger Vital Sign    Worried About Running Out of Food in the Last Year: Never true    Ran Out of Food in the Last Year: Never true  Transportation Needs: No Transportation Needs (06/04/2022)   PRAPARE - Administrator, Civil Service (Medical): No    Lack of Transportation (Non-Medical): No  Physical Activity: Inactive (01/18/2022)   Exercise Vital Sign    Days of Exercise per Week: 0 days    Minutes of Exercise per Session: 0 min  Stress: Stress Concern  Present (01/18/2022)   [employer] of Occupational Health - Occupational Stress Questionnaire    Feeling of Stress : To some extent  Social Connections: Socially Isolated (01/18/2022)   Social Connection and Isolation Panel [NHANES]    Frequency of Communication with Friends and Family: More than three times a week    Frequency of Social Gatherings with Friends and Family: More than three times a week    Attends Religious Services: Never    Database administrator or Organizations: No    Attends Banker Meetings: Never    Marital Status: Never married  Intimate Partner Violence: Not At Risk (01/18/2022)   Humiliation, Afraid, Rape, and Kick questionnaire    Fear of Current or Ex-Partner: No    Emotionally Abused: No    Physically Abused: No    Sexually Abused: No    Physical Exam      No future appointments.

## 2023-04-15 ENCOUNTER — Other Ambulatory Visit: Payer: Self-pay

## 2023-04-19 ENCOUNTER — Other Ambulatory Visit: Payer: Self-pay

## 2023-04-20 ENCOUNTER — Other Ambulatory Visit (HOSPITAL_COMMUNITY): Payer: Self-pay | Admitting: Emergency Medicine

## 2023-04-20 ENCOUNTER — Other Ambulatory Visit: Payer: Self-pay

## 2023-04-20 NOTE — Progress Notes (Signed)
Paramedicine Encounter    Patient ID: Thomas West, male    DOB: Aug 22, 1960, 62 y.o.   MRN: [id number]   Complaints NONE  Assessment A&O x 4, skin W&D w/ good color.  Denies chest pain or SOB.  Lung sounds clear and equal throughout and no peripheral edema noted.  No headache or dizziness.  Normal color to bowel movements.  Compliance with meds YES  Pill box filled x 1 week  Refills needed Cymbalta, Vyndamax  Meds changes since last visit NONE    Social changes NONE   BP 110/80 (BP Location: Left Arm, Patient Position: Sitting, Cuff Size: Normal)   Pulse 95   Resp 14   Wt 170 lb (77.1 kg)   SpO2 98%   BMI 25.10 kg/m  Weight yesterday- not taken Last visit weight-170  ACTION: Home visit completed  [name] [phone] 04/26/23  Patient Care Team: [name], [name], MD as PCP - General (Family Medicine) Bensimhon, Bevelyn Buckles, MD as PCP - Cardiology (Cardiology) Glendale Chard, DO as Consulting Physician (Neurology) Sherrill Raring, [facility] (Pharmacist) Sherrill Raring, [facility] (Pharmacist)  Patient Active Problem List   Diagnosis Date Noted   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   Pruritic rash 05/25/2022   Polyneuropathy associated with underlying disease (HCC) 01/15/2022   Atherosclerosis of aorta (HCC) 01/15/2022   History of CVA (cerebrovascular accident) without residual deficits 12/24/2021   AKI (acute kidney injury) (HCC) 12/13/2021   Diarrhea 12/13/2021   Shortness of breath 12/13/2021   Chest pain 12/13/2021   Nausea and vomiting 12/13/2021   Heart failure (HCC) 12/08/2021   Hypertension associated with diabetes (HCC) 07/12/2021   Chronic kidney disease, stage 3a (HCC) 07/12/2021   Hyperkalemia 07/12/2021   CHF exacerbation (HCC) 04/20/2021   HFrEF (heart failure with reduced ejection fraction) (HCC) 02/03/2021   Diabetes mellitus (HCC) 03/27/2019   Hyperlipidemia associated with type 2 diabetes mellitus (HCC) 03/27/2019   CAD  (coronary artery disease) 03/05/2019   History of MI (myocardial infarction) 03/05/2019   Type 2 diabetes mellitus with diabetic neuropathy, unspecified (HCC) 03/05/2019   HLD (hyperlipidemia) 03/05/2019   GERD (gastroesophageal reflux disease) 03/05/2019   Hypertension, essential, benign 03/05/2019    Current Outpatient Medications:    aspirin EC 81 MG tablet, Take 1 tablet (81 mg total) by mouth daily. Swallow whole., Disp: 30 tablet, Rfl: 11   atorvastatin (LIPITOR) 80 MG tablet, Take 1 tablet (80 mg total) by mouth daily., Disp: 90 tablet, Rfl: 3   carvedilol (COREG) 6.25 MG tablet, Take 1 tablet (6.25 mg total) by mouth 2 (two) times daily., Disp: 180 tablet, Rfl: 3   clopidogrel (PLAVIX) 75 MG tablet, Take 1 tablet (75 mg total) by mouth daily., Disp: 90 tablet, Rfl: 0   cyanocobalamin (VITAMIN B12) 1000 MCG tablet, TAKE ONE TABLET BY MOUTH DAILY, Disp: 30 tablet, Rfl: 3   DULoxetine (CYMBALTA) 30 MG capsule, Take 1 capsule (30 mg total) by mouth daily., Disp: 30 capsule, Rfl: 0   ezetimibe (ZETIA) 10 MG tablet, Take 1 tablet (10 mg total) by mouth daily., Disp: 30 tablet, Rfl: 3   hydrALAZINE (APRESOLINE) 50 MG tablet, Take 2 tablets (100 mg total) by mouth 3 (three) times daily., Disp: 180 tablet, Rfl: 11   hydrocortisone cream 1 %, Apply 1 Application topically 2 (two) times daily as needed for itching., Disp: 45 g, Rfl: 1   isosorbide mononitrate (IMDUR) 30 MG 24 hr tablet, Take 1.5 tablets (45 mg total) by mouth daily., Disp:  135 tablet, Rfl: 3   linagliptin (TRADJENTA) 5 MG TABS tablet, Take 1 tablet (5 mg total) by mouth daily., Disp: 30 tablet, Rfl: 3   sacubitril-valsartan (ENTRESTO) 97-103 MG, Take 1 tablet by mouth 2 (two) times daily., Disp: 60 tablet, Rfl: 3   spironolactone (ALDACTONE) 25 MG tablet, Take 1 tablet (25 mg total) by mouth daily., Disp: 30 tablet, Rfl: 11   Tafamidis (VYNDAMAX) 61 MG CAPS, Take 1 capsule by mouth daily., Disp: 30 capsule, Rfl: 11   torsemide  (DEMADEX) 20 MG tablet, Take 20 mg by mouth daily. Take 10mg  on Monday, Wednesday, Friday. Take 20mg  all other days., Disp: , Rfl:    empagliflozin (JARDIANCE) 25 MG TABS tablet, Take 1 tablet (25 mg total) by mouth daily before breakfast., Disp: 90 tablet, Rfl: 0   spironolactone (ALDACTONE) 25 MG tablet, Take 1 tablet (25 mg total) by mouth daily. (Patient not taking: Reported on 03/30/2023), Disp: 30 tablet, Rfl: 4   torsemide (DEMADEX) 20 MG tablet, Take 1 tablet (20 mg total) by mouth every Monday, Wednesday, and Friday., Disp: 30 tablet, Rfl: 3 Allergies  Allergen Reactions   Bee Venom Anaphylaxis, Swelling and Other (See Comments)    Swells all over     Social History   Socioeconomic History   Marital status: Single    Spouse name: Not on file   Number of children: Not on file   Years of education: Not on file   Highest education level: Not on file  Occupational History   Not on file  Tobacco Use   Smoking status: Former    Current packs/day: 0.00    Types: Cigarettes    Quit date: 10/06/2017    Years since quitting: 5.5   Smokeless tobacco: Never  Substance and Sexual Activity   Alcohol use: Yes    Comment: Occasional Beer   Drug use: Yes    Types: Marijuana   Sexual activity: Not on file  Other Topics Concern   Not on file  Social History Narrative   Right Handed.    Lives in a two story home    Lives with 44 year old son.    Social Determinants of Health   Financial Resource Strain: Low Risk  (01/18/2022)   Overall Financial Resource Strain (CARDIA)    Difficulty of Paying Living Expenses: Not hard at all  Food Insecurity: No Food Insecurity (09/21/2022)   Hunger Vital Sign    Worried About Running Out of Food in the Last Year: Never true    Ran Out of Food in the Last Year: Never true  Transportation Needs: No Transportation Needs (06/04/2022)   PRAPARE - Administrator, Civil Service (Medical): No    Lack of Transportation (Non-Medical): No   Physical Activity: Inactive (01/18/2022)   Exercise Vital Sign    Days of Exercise per Week: 0 days    Minutes of Exercise per Session: 0 min  Stress: Stress Concern Present (01/18/2022)   [employer] of Occupational Health - Occupational Stress Questionnaire    Feeling of Stress : To some extent  Social Connections: Socially Isolated (01/18/2022)   Social Connection and Isolation Panel [NHANES]    Frequency of Communication with Friends and Family: More than three times a week    Frequency of Social Gatherings with Friends and Family: More than three times a week    Attends Religious Services: Never    Database administrator or Organizations: No    Attends [employer]  or Organization Meetings: Never    Marital Status: Never married  Intimate Partner Violence: Not At Risk (01/18/2022)   Humiliation, Afraid, Rape, and Kick questionnaire    Fear of Current or Ex-Partner: No    Emotionally Abused: No    Physically Abused: No    Sexually Abused: No    Physical Exam      No future appointments.

## 2023-04-22 ENCOUNTER — Other Ambulatory Visit (HOSPITAL_COMMUNITY): Payer: Self-pay

## 2023-04-22 ENCOUNTER — Other Ambulatory Visit: Payer: Self-pay

## 2023-04-26 ENCOUNTER — Other Ambulatory Visit (HOSPITAL_COMMUNITY): Payer: Self-pay

## 2023-04-26 ENCOUNTER — Other Ambulatory Visit: Payer: Self-pay

## 2023-04-26 ENCOUNTER — Telehealth (HOSPITAL_COMMUNITY): Payer: Self-pay | Admitting: Cardiology

## 2023-04-26 ENCOUNTER — Encounter (HOSPITAL_COMMUNITY): Payer: Self-pay

## 2023-04-26 ENCOUNTER — Other Ambulatory Visit: Payer: Self-pay | Admitting: Family Medicine

## 2023-04-26 ENCOUNTER — Other Ambulatory Visit (HOSPITAL_COMMUNITY): Payer: Self-pay | Admitting: Emergency Medicine

## 2023-04-26 DIAGNOSIS — I5020 Unspecified systolic (congestive) heart failure: Secondary | ICD-10-CM

## 2023-04-26 DIAGNOSIS — E1140 Type 2 diabetes mellitus with diabetic neuropathy, unspecified: Secondary | ICD-10-CM

## 2023-04-26 DIAGNOSIS — E854 Organ-limited amyloidosis: Secondary | ICD-10-CM

## 2023-04-26 MED ORDER — EMPAGLIFLOZIN 25 MG PO TABS
25.0000 mg | ORAL_TABLET | Freq: Every day | ORAL | 0 refills | Status: DC
  Filled 2023-04-26: qty 90, 90d supply, fill #0

## 2023-04-26 NOTE — Telephone Encounter (Signed)
-----   Message from CMA [name] S sent at 04/26/2023  3:33 PM EST ----- Regarding: HTN Home visit today finds Thomas West BP  200/100 and his weight is up 5lbs.  He has not been compliant w/ his meds.  Missed multiple doses of his Hydralazine and 1 pm dose of his meds.  He also admit to poor eating habits over thanksgiving.    He denies chest pain or SOB.  No dizziness, weakness, no headache, no SOB. No edema to his extremities.   Please advise of any med changes desired if any.       Beatrix Shipper, EMT-Paramedic [phone] 04/26/2023

## 2023-04-26 NOTE — Progress Notes (Signed)
Specialty Pharmacy Refill Coordination Note  Thomas West is a 62 y.o. male contacted today regarding refills of specialty medication(s) Tafamidis   Patient requested Delivery   Delivery date: 04/29/23   Verified address: [address], [place] [address]   Medication will be filled on 04/28/23.

## 2023-04-26 NOTE — Progress Notes (Signed)
Paramedicine Encounter    Patient ID: Thomas West, male    DOB: 1960/12/29, 62 y.o.   MRN: [id number]   Complaints NONE  Assessment A&O x 4, skin W&D w/ good color.  Denies chest pain or SOB.  Hypertensive - asymptomatic of same.  Compliance with meds No - not taking Hydralazine and missed 1 pm  dose of meds.  Pill box filled x 1 week  Refills needed Duloxetine, Vyndamax  Meds changes since last visit NONE    Social changes NONE   There were no vitals taken for this visit. Weight yesterday- not taken Last visit weight-170lb  ACTION: {Paramed Action:[phone]}  Beatrix Shipper, EMT-Paramedic [phone] 04/26/23  Patient Care Team: [name], [name], MD as PCP - General (Family Medicine) Bensimhon, Bevelyn Buckles, MD as PCP - Cardiology (Cardiology) Glendale Chard, DO as Consulting Physician (Neurology) Sherrill Raring, [facility] (Pharmacist) Sherrill Raring, [facility] (Pharmacist)  Patient Active Problem List   Diagnosis Date Noted  . 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 Pruritic rash 05/25/2022  . Polyneuropathy associated with underlying disease (HCC) 01/15/2022  . Atherosclerosis of aorta (HCC) 01/15/2022  . History of CVA (cerebrovascular accident) without residual deficits 12/24/2021  . AKI (acute kidney injury) (HCC) 12/13/2021  . Diarrhea 12/13/2021  . Shortness of breath 12/13/2021  . Chest pain 12/13/2021  . Nausea and vomiting 12/13/2021  . Heart failure (HCC) 12/08/2021  . Hypertension associated with diabetes (HCC) 07/12/2021  . Chronic kidney disease, stage 3a (HCC) 07/12/2021  . Hyperkalemia 07/12/2021  . CHF exacerbation (HCC) 04/20/2021  . HFrEF (heart failure with reduced ejection fraction) (HCC) 02/03/2021  . Diabetes mellitus (HCC) 03/27/2019  . Hyperlipidemia associated with type 2 diabetes mellitus (HCC) 03/27/2019  . CAD (coronary artery disease) 03/05/2019  . History of MI (myocardial infarction) 03/05/2019  . Type 2 diabetes mellitus with  diabetic neuropathy, unspecified (HCC) 03/05/2019  . HLD (hyperlipidemia) 03/05/2019  . GERD (gastroesophageal reflux disease) 03/05/2019  . Hypertension, essential, benign 03/05/2019    Current Outpatient Medications:  .  aspirin EC 81 MG tablet, Take 1 tablet (81 mg total) by mouth daily. Swallow whole., Disp: 30 tablet, Rfl: 11 .  atorvastatin (LIPITOR) 80 MG tablet, Take 1 tablet (80 mg total) by mouth daily., Disp: 90 tablet, Rfl: 3 .  carvedilol (COREG) 6.25 MG tablet, Take 1 tablet (6.25 mg total) by mouth 2 (two) times daily., Disp: 180 tablet, Rfl: 3 .  clopidogrel (PLAVIX) 75 MG tablet, Take 1 tablet (75 mg total) by mouth daily., Disp: 90 tablet, Rfl: 0 .  cyanocobalamin (VITAMIN B12) 1000 MCG tablet, TAKE ONE TABLET BY MOUTH DAILY, Disp: 30 tablet, Rfl: 3 .  DULoxetine (CYMBALTA) 30 MG capsule, Take 1 capsule (30 mg total) by mouth daily., Disp: 30 capsule, Rfl: 0 .  empagliflozin (JARDIANCE) 25 MG TABS tablet, Take 1 tablet (25 mg total) by mouth daily before breakfast., Disp: 90 tablet, Rfl: 0 .  ezetimibe (ZETIA) 10 MG tablet, Take 1 tablet (10 mg total) by mouth daily., Disp: 30 tablet, Rfl: 3 .  hydrALAZINE (APRESOLINE) 50 MG tablet, Take 2 tablets (100 mg total) by mouth 3 (three) times daily., Disp: 180 tablet, Rfl: 11 .  hydrocortisone cream 1 %, Apply 1 Application topically 2 (two) times daily as needed for itching., Disp: 45 g, Rfl: 1 .  isosorbide mononitrate (IMDUR) 30 MG 24 hr tablet, Take 1.5 tablets (45 mg total) by mouth daily., Disp: 135 tablet, Rfl: 3 .  linagliptin (TRADJENTA) 5 MG TABS  tablet, Take 1 tablet (5 mg total) by mouth daily., Disp: 30 tablet, Rfl: 3 .  sacubitril-valsartan (ENTRESTO) 97-103 MG, Take 1 tablet by mouth 2 (two) times daily., Disp: 60 tablet, Rfl: 3 .  spironolactone (ALDACTONE) 25 MG tablet, Take 1 tablet (25 mg total) by mouth daily., Disp: 30 tablet, Rfl: 11 .  spironolactone (ALDACTONE) 25 MG tablet, Take 1 tablet (25 mg total) by  mouth daily. (Patient not taking: Reported on 03/30/2023), Disp: 30 tablet, Rfl: 4 .  Tafamidis (VYNDAMAX) 61 MG CAPS, Take 1 capsule by mouth daily., Disp: 30 capsule, Rfl: 11 .  torsemide (DEMADEX) 20 MG tablet, Take 20 mg by mouth daily. Take 10mg  on Monday, Wednesday, Friday. Take 20mg  all other days., Disp: , Rfl:  .  torsemide (DEMADEX) 20 MG tablet, Take 1 tablet (20 mg total) by mouth every Monday, Wednesday, and Friday., Disp: 30 tablet, Rfl: 3 Allergies  Allergen Reactions  . Bee Venom Anaphylaxis, Swelling and Other (See Comments)    Swells all over     Social History   Socioeconomic History  . Marital status: Single    Spouse name: Not on file  . Number of children: Not on file  . Years of education: Not on file  . Highest education level: Not on file  Occupational History  . Not on file  Tobacco Use  . Smoking status: Former    Current packs/day: 0.00    Types: Cigarettes    Quit date: 10/06/2017    Years since quitting: 5.5  . Smokeless tobacco: Never  Substance and Sexual Activity  . Alcohol use: Yes    Comment: Occasional Beer  . Drug use: Yes    Types: Marijuana  . Sexual activity: Not on file  Other Topics Concern  . Not on file  Social History Narrative   Right Handed.    Lives in a two story home    Lives with 68 year old son.    Social Determinants of Health   Financial Resource Strain: Low Risk  (01/18/2022)   Overall Financial Resource Strain (CARDIA)   . Difficulty of Paying Living Expenses: Not hard at all  Food Insecurity: No Food Insecurity (09/21/2022)   Hunger Vital Sign   . Worried About Programme researcher, broadcasting/film/video in the Last Year: Never true   . Ran Out of Food in the Last Year: Never true  Transportation Needs: No Transportation Needs (06/04/2022)   PRAPARE - Transportation   . Lack of Transportation (Medical): No   . Lack of Transportation (Non-Medical): No  Physical Activity: Inactive (01/18/2022)   Exercise Vital Sign   . Days of Exercise  per Week: 0 days   . Minutes of Exercise per Session: 0 min  Stress: Stress Concern Present (01/18/2022)   [employer] of Occupational Health - Occupational Stress Questionnaire   . Feeling of Stress : To some extent  Social Connections: Socially Isolated (01/18/2022)   Social Connection and Isolation Panel [NHANES]   . Frequency of Communication with Friends and Family: More than three times a week   . Frequency of Social Gatherings with Friends and Family: More than three times a week   . Attends Religious Services: Never   . Active Member of Clubs or Organizations: No   . Attends Banker Meetings: Never   . Marital Status: Never married  Intimate Partner Violence: Not At Risk (01/18/2022)   Humiliation, Afraid, Rape, and Kick questionnaire   . Fear of Current or Ex-Partner:  No   . Emotionally Abused: No   . Physically Abused: No   . Sexually Abused: No    Physical Exam      No future appointments.

## 2023-04-27 ENCOUNTER — Other Ambulatory Visit (HOSPITAL_COMMUNITY): Payer: Self-pay

## 2023-04-27 ENCOUNTER — Other Ambulatory Visit: Payer: Self-pay

## 2023-04-27 MED ORDER — ISOSORBIDE MONONITRATE ER 60 MG PO TB24
90.0000 mg | ORAL_TABLET | Freq: Every day | ORAL | 6 refills | Status: DC
Start: 2023-04-27 — End: 2023-10-26
  Filled 2023-04-27: qty 45, 30d supply, fill #0
  Filled 2023-05-20: qty 45, 30d supply, fill #1
  Filled 2023-06-20: qty 45, 30d supply, fill #2
  Filled 2023-07-27: qty 45, 30d supply, fill #3
  Filled 2023-08-26: qty 45, 30d supply, fill #4
  Filled 2023-09-26: qty 45, 30d supply, fill #5
  Filled 2023-10-19: qty 45, 30d supply, fill #6

## 2023-04-27 NOTE — Telephone Encounter (Signed)
Pt aware and voiced understanding  -aware to report to ER  Message to [facility] with update

## 2023-04-28 ENCOUNTER — Other Ambulatory Visit: Payer: Self-pay

## 2023-05-04 ENCOUNTER — Other Ambulatory Visit (HOSPITAL_COMMUNITY): Payer: Self-pay | Admitting: Emergency Medicine

## 2023-05-04 NOTE — Progress Notes (Signed)
Paramedicine Encounter    Patient ID: Thomas West, male    DOB: 1961-01-16, 62 y.o.   MRN: [id number]   Complaints NONE  Assessment A&O x 4, skin W&D w/ good color.    Compliance with meds YES  Pill box filled x 1 week  Refills needed Duloxetine  Meds changes since last visit     Social changes NONE   BP 130/80 (BP Location: Left Arm, Patient Position: Sitting, Cuff Size: Normal)   Pulse 80   Resp 14   Wt 165 lb 9.6 oz (75.1 kg)   SpO2 95%   BMI 24.45 kg/m  Weight yesterday-not taken Last visit weight-175lb  ACTION: Home visit completed  [name] [phone] 05/04/23  Patient Care Team: [name], [name], MD as PCP - General (Family Medicine) Bensimhon, Bevelyn Buckles, MD as PCP - Cardiology (Cardiology) Glendale Chard, DO as Consulting Physician (Neurology) Sherrill Raring, [facility] (Pharmacist) Sherrill Raring, [facility] (Pharmacist)  Patient Active Problem List   Diagnosis Date Noted   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   Pruritic rash 05/25/2022   Polyneuropathy associated with underlying disease (HCC) 01/15/2022   Atherosclerosis of aorta (HCC) 01/15/2022   History of CVA (cerebrovascular accident) without residual deficits 12/24/2021   AKI (acute kidney injury) (HCC) 12/13/2021   Diarrhea 12/13/2021   Shortness of breath 12/13/2021   Chest pain 12/13/2021   Nausea and vomiting 12/13/2021   Heart failure (HCC) 12/08/2021   Hypertension associated with diabetes (HCC) 07/12/2021   Chronic kidney disease, stage 3a (HCC) 07/12/2021   Hyperkalemia 07/12/2021   CHF exacerbation (HCC) 04/20/2021   HFrEF (heart failure with reduced ejection fraction) (HCC) 02/03/2021   Diabetes mellitus (HCC) 03/27/2019   Hyperlipidemia associated with type 2 diabetes mellitus (HCC) 03/27/2019   CAD (coronary artery disease) 03/05/2019   History of MI (myocardial infarction) 03/05/2019   Type 2 diabetes mellitus with diabetic neuropathy, unspecified (HCC)  03/05/2019   HLD (hyperlipidemia) 03/05/2019   GERD (gastroesophageal reflux disease) 03/05/2019   Hypertension, essential, benign 03/05/2019    Current Outpatient Medications:    aspirin EC 81 MG tablet, Take 1 tablet (81 mg total) by mouth daily. Swallow whole., Disp: 30 tablet, Rfl: 11   atorvastatin (LIPITOR) 80 MG tablet, Take 1 tablet (80 mg total) by mouth daily., Disp: 90 tablet, Rfl: 3   carvedilol (COREG) 6.25 MG tablet, Take 1 tablet (6.25 mg total) by mouth 2 (two) times daily., Disp: 180 tablet, Rfl: 3   clopidogrel (PLAVIX) 75 MG tablet, Take 1 tablet (75 mg total) by mouth daily., Disp: 90 tablet, Rfl: 0   cyanocobalamin (VITAMIN B12) 1000 MCG tablet, TAKE ONE TABLET BY MOUTH DAILY, Disp: 30 tablet, Rfl: 3   DULoxetine (CYMBALTA) 30 MG capsule, Take 1 capsule (30 mg total) by mouth daily., Disp: 30 capsule, Rfl: 0   empagliflozin (JARDIANCE) 25 MG TABS tablet, Take 1 tablet (25 mg total) by mouth daily before breakfast., Disp: 90 tablet, Rfl: 0   ezetimibe (ZETIA) 10 MG tablet, Take 1 tablet (10 mg total) by mouth daily., Disp: 30 tablet, Rfl: 3   hydrALAZINE (APRESOLINE) 50 MG tablet, Take 2 tablets (100 mg total) by mouth 3 (three) times daily., Disp: 180 tablet, Rfl: 11   hydrocortisone cream 1 %, Apply 1 Application topically 2 (two) times daily as needed for itching., Disp: 45 g, Rfl: 1   isosorbide mononitrate (IMDUR) 60 MG 24 hr tablet, Take 1.5 tablets (90 mg total) by mouth daily., Disp: 45 tablet, Rfl:  6   linagliptin (TRADJENTA) 5 MG TABS tablet, Take 1 tablet (5 mg total) by mouth daily., Disp: 30 tablet, Rfl: 3   sacubitril-valsartan (ENTRESTO) 97-103 MG, Take 1 tablet by mouth 2 (two) times daily., Disp: 60 tablet, Rfl: 3   spironolactone (ALDACTONE) 25 MG tablet, Take 1 tablet (25 mg total) by mouth daily., Disp: 30 tablet, Rfl: 11   spironolactone (ALDACTONE) 25 MG tablet, Take 1 tablet (25 mg total) by mouth daily. (Patient not taking: Reported on 03/30/2023),  Disp: 30 tablet, Rfl: 4   Tafamidis (VYNDAMAX) 61 MG CAPS, Take 1 capsule by mouth daily., Disp: 30 capsule, Rfl: 11   torsemide (DEMADEX) 20 MG tablet, Take 20 mg by mouth daily. Take 10mg  on Monday, Wednesday, Friday. Take 20mg  all other days., Disp: , Rfl:    torsemide (DEMADEX) 20 MG tablet, Take 1 tablet (20 mg total) by mouth every Monday, Wednesday, and Friday., Disp: 30 tablet, Rfl: 3 Allergies  Allergen Reactions   Bee Venom Anaphylaxis, Swelling and Other (See Comments)    Swells all over     Social History   Socioeconomic History   Marital status: Single    Spouse name: Not on file   Number of children: Not on file   Years of education: Not on file   Highest education level: Not on file  Occupational History   Not on file  Tobacco Use   Smoking status: Former    Current packs/day: 0.00    Types: Cigarettes    Quit date: 10/06/2017    Years since quitting: 5.5   Smokeless tobacco: Never  Substance and Sexual Activity   Alcohol use: Yes    Comment: Occasional Beer   Drug use: Yes    Types: Marijuana   Sexual activity: Not on file  Other Topics Concern   Not on file  Social History Narrative   Right Handed.    Lives in a two story home    Lives with 83 year old son.    Social Determinants of Health   Financial Resource Strain: Low Risk  (01/18/2022)   Overall Financial Resource Strain (CARDIA)    Difficulty of Paying Living Expenses: Not hard at all  Food Insecurity: No Food Insecurity (09/21/2022)   Hunger Vital Sign    Worried About Running Out of Food in the Last Year: Never true    Ran Out of Food in the Last Year: Never true  Transportation Needs: No Transportation Needs (06/04/2022)   PRAPARE - Administrator, Civil Service (Medical): No    Lack of Transportation (Non-Medical): No  Physical Activity: Inactive (01/18/2022)   Exercise Vital Sign    Days of Exercise per Week: 0 days    Minutes of Exercise per Session: 0 min  Stress: Stress  Concern Present (01/18/2022)   [employer] of Occupational Health - Occupational Stress Questionnaire    Feeling of Stress : To some extent  Social Connections: Socially Isolated (01/18/2022)   Social Connection and Isolation Panel [NHANES]    Frequency of Communication with Friends and Family: More than three times a week    Frequency of Social Gatherings with Friends and Family: More than three times a week    Attends Religious Services: Never    Database administrator or Organizations: No    Attends Banker Meetings: Never    Marital Status: Never married  Intimate Partner Violence: Not At Risk (01/18/2022)   Humiliation, Afraid, Rape, and Kick questionnaire  Fear of Current or Ex-Partner: No    Emotionally Abused: No    Physically Abused: No    Sexually Abused: No    Physical Exam      No future appointments.

## 2023-05-05 ENCOUNTER — Telehealth (HOSPITAL_COMMUNITY): Payer: Self-pay | Admitting: Cardiology

## 2023-05-05 NOTE — Telephone Encounter (Signed)
[facility] with para medicine called to report pt did not increase imdur to 90 mg as prescribed on 12/3.  Home visit today b/p 130/80 Weight stable  Please provide details for imdur, continue 60 mg or increase to 90?

## 2023-05-05 NOTE — Telephone Encounter (Signed)
He can increase as previously directed.

## 2023-05-06 ENCOUNTER — Other Ambulatory Visit (HOSPITAL_COMMUNITY): Payer: Self-pay

## 2023-05-06 ENCOUNTER — Other Ambulatory Visit (HOSPITAL_BASED_OUTPATIENT_CLINIC_OR_DEPARTMENT_OTHER): Payer: Self-pay

## 2023-05-06 NOTE — Telephone Encounter (Signed)
Pt aware. Message to para medicine as Lorain Childes

## 2023-05-09 ENCOUNTER — Other Ambulatory Visit: Payer: Self-pay

## 2023-05-09 ENCOUNTER — Other Ambulatory Visit (HOSPITAL_COMMUNITY): Payer: Self-pay | Admitting: Family Medicine

## 2023-05-09 ENCOUNTER — Other Ambulatory Visit (HOSPITAL_COMMUNITY): Payer: Self-pay

## 2023-05-09 MED ORDER — DULOXETINE HCL 30 MG PO CPEP
30.0000 mg | ORAL_CAPSULE | Freq: Every day | ORAL | 0 refills | Status: DC
  Filled 2023-05-09: qty 30, 30d supply, fill #0

## 2023-05-11 ENCOUNTER — Other Ambulatory Visit (HOSPITAL_COMMUNITY): Payer: Self-pay | Admitting: Emergency Medicine

## 2023-05-11 ENCOUNTER — Ambulatory Visit (INDEPENDENT_AMBULATORY_CARE_PROVIDER_SITE_OTHER): Payer: No Typology Code available for payment source | Admitting: Family Medicine

## 2023-05-11 ENCOUNTER — Encounter: Payer: Self-pay | Admitting: Family Medicine

## 2023-05-11 ENCOUNTER — Other Ambulatory Visit: Payer: Self-pay

## 2023-05-11 ENCOUNTER — Other Ambulatory Visit (HOSPITAL_COMMUNITY): Payer: Self-pay

## 2023-05-11 VITALS — BP 120/82 | HR 100 | Temp 98.8°F | Resp 16 | Ht 69.0 in | Wt 173.1 lb

## 2023-05-11 DIAGNOSIS — R062 Wheezing: Secondary | ICD-10-CM

## 2023-05-11 DIAGNOSIS — N1831 Chronic kidney disease, stage 3a: Secondary | ICD-10-CM | POA: Diagnosis not present

## 2023-05-11 DIAGNOSIS — I10 Essential (primary) hypertension: Secondary | ICD-10-CM

## 2023-05-11 DIAGNOSIS — Z7984 Long term (current) use of oral hypoglycemic drugs: Secondary | ICD-10-CM

## 2023-05-11 DIAGNOSIS — G63 Polyneuropathy in diseases classified elsewhere: Secondary | ICD-10-CM | POA: Diagnosis not present

## 2023-05-11 DIAGNOSIS — E1140 Type 2 diabetes mellitus with diabetic neuropathy, unspecified: Secondary | ICD-10-CM

## 2023-05-11 LAB — POCT GLYCOSYLATED HEMOGLOBIN (HGB A1C): HbA1c, POC (controlled diabetic range): 7.8 % — AB (ref 0.0–7.0)

## 2023-05-11 MED ORDER — RYBELSUS 3 MG PO TABS
3.0000 mg | ORAL_TABLET | Freq: Every day | ORAL | Status: DC
Start: 2023-05-11 — End: 2023-06-01

## 2023-05-11 MED ORDER — ALBUTEROL SULFATE HFA 108 (90 BASE) MCG/ACT IN AERS
INHALATION_SPRAY | RESPIRATORY_TRACT | 0 refills | Status: DC
Start: 2023-05-11 — End: 2023-05-30
  Filled 2023-05-11: qty 6.7, 25d supply, fill #0

## 2023-05-11 MED ORDER — DULOXETINE HCL 30 MG PO CPEP
30.0000 mg | ORAL_CAPSULE | Freq: Every day | ORAL | 2 refills | Status: DC
  Filled 2023-05-11 – 2023-06-01 (×3): qty 90, 90d supply, fill #0
  Filled 2023-08-11: qty 90, 90d supply, fill #1
  Filled 2023-11-15 (×2): qty 90, 90d supply, fill #2

## 2023-05-11 NOTE — Assessment & Plan Note (Signed)
Today DBP mildly elevated, he reports that at home he has been told his BP is "good." Current on carvedilol 6.25 mg twice daily, hydralazine 50 mg 3 times daily, Imdur 30 mg daily, spironolactone 25 mg daily, and Entresto 97-103 mg bid (for CHF). Continue monitoring BP regularly. Low-salt diet  to continue. He is due for eye exam.

## 2023-05-11 NOTE — Assessment & Plan Note (Signed)
Problem is otherwise stable, Cr and e GFR have fluctuated from 1.2-2.2 and 32-43->60 for the past few months respectively. Continue adequate hydration, as well as good BP and glucose controlled. Low salt diet recommended and continue avoidance of NSAID's. He is on Jardiance 25 mg daily.

## 2023-05-11 NOTE — Assessment & Plan Note (Signed)
HgA1C improved but still not at goal. Today 7.8 (8.1). He tells me he is not taking "diabetic medications", he is going to reviewed meds at home and let me know if there is something on his list he is not on. We do not have many options due to his other co-morbilities. He is not interested in injectable medication. I recommended trying Rybelsus 3 mg, samples given. We discussed some side effects and risk of interaction if taken at the same time that Tradjenta, so recommend taking it 3-4 hours apart. No changes in [place]. Continue monitoring BS's. He is overdue for eye exam. Continue appropriate foot and dental care. F/U in 3-4 months.

## 2023-05-11 NOTE — Progress Notes (Signed)
Paramedicine Encounter    Patient ID: Thomas West, male    DOB: 22-Oct-1960, 62 y.o.   MRN: [id number]   Complaints NONE  Assessment A&O x 4, skin W&D w/ good color.  Denies chst pain or SOB.  Lung sounds clear throughout.  No peripheral edema noted.  Compliance with meds YES  Pill box filled 2 weeks filled due to holiday schedule  Refills needed NONE  Meds changes since last visit Increased Isosorbide to 90mg . Daily.    Social changes None   BP (!) 140/80 (BP Location: Left Arm, Cuff Size: Normal)   Pulse 88   Resp 14   Wt 170 lb 9.6 oz (77.4 kg)   SpO2 96%   BMI 25.19 kg/m  Weight yesterday- Not taken Last visit weight-165lb  ACTION: Home visit completed  [name] [phone] 05/13/23  Patient Care Team: [name], [name], MD as PCP - General (Family Medicine) Bensimhon, Bevelyn Buckles, MD as PCP - Cardiology (Cardiology) Glendale Chard, DO as Consulting Physician (Neurology) Sherrill Raring, [facility] (Pharmacist) Sherrill Raring, [facility] (Pharmacist)  Patient Active Problem List   Diagnosis Date Noted   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   Pruritic rash 05/25/2022   Polyneuropathy associated with underlying disease (HCC) 01/15/2022   Atherosclerosis of aorta (HCC) 01/15/2022   History of CVA (cerebrovascular accident) without residual deficits 12/24/2021   AKI (acute kidney injury) (HCC) 12/13/2021   Diarrhea 12/13/2021   Shortness of breath 12/13/2021   Chest pain 12/13/2021   Nausea and vomiting 12/13/2021   Heart failure (HCC) 12/08/2021   Hypertension associated with diabetes (HCC) 07/12/2021   Chronic kidney disease, stage 3a (HCC) 07/12/2021   Hyperkalemia 07/12/2021   CHF exacerbation (HCC) 04/20/2021   HFrEF (heart failure with reduced ejection fraction) (HCC) 02/03/2021   Diabetes mellitus (HCC) 03/27/2019   Hyperlipidemia associated with type 2 diabetes mellitus (HCC) 03/27/2019   CAD (coronary artery disease) 03/05/2019   History  of MI (myocardial infarction) 03/05/2019   Type 2 diabetes mellitus with diabetic neuropathy, unspecified (HCC) 03/05/2019   HLD (hyperlipidemia) 03/05/2019   GERD (gastroesophageal reflux disease) 03/05/2019   Hypertension, essential, benign 03/05/2019    Current Outpatient Medications:    aspirin EC 81 MG tablet, Take 1 tablet (81 mg total) by mouth daily. Swallow whole., Disp: 30 tablet, Rfl: 11   atorvastatin (LIPITOR) 80 MG tablet, Take 1 tablet (80 mg total) by mouth daily., Disp: 90 tablet, Rfl: 3   carvedilol (COREG) 6.25 MG tablet, Take 1 tablet (6.25 mg total) by mouth 2 (two) times daily., Disp: 180 tablet, Rfl: 3   clopidogrel (PLAVIX) 75 MG tablet, Take 1 tablet (75 mg total) by mouth daily., Disp: 90 tablet, Rfl: 0   cyanocobalamin (VITAMIN B12) 1000 MCG tablet, TAKE ONE TABLET BY MOUTH DAILY, Disp: 30 tablet, Rfl: 3   empagliflozin (JARDIANCE) 25 MG TABS tablet, Take 1 tablet (25 mg total) by mouth daily before breakfast., Disp: 90 tablet, Rfl: 0   ezetimibe (ZETIA) 10 MG tablet, Take 1 tablet (10 mg total) by mouth daily., Disp: 30 tablet, Rfl: 3   hydrALAZINE (APRESOLINE) 50 MG tablet, Take 2 tablets (100 mg total) by mouth 3 (three) times daily., Disp: 180 tablet, Rfl: 11   hydrocortisone cream 1 %, Apply 1 Application topically 2 (two) times daily as needed for itching., Disp: 45 g, Rfl: 1   isosorbide mononitrate (IMDUR) 60 MG 24 hr tablet, Take 1.5 tablets (90 mg total) by mouth daily., Disp: 45 tablet, Rfl:  6   linagliptin (TRADJENTA) 5 MG TABS tablet, Take 1 tablet (5 mg total) by mouth daily., Disp: 30 tablet, Rfl: 3   sacubitril-valsartan (ENTRESTO) 97-103 MG, Take 1 tablet by mouth 2 (two) times daily., Disp: 60 tablet, Rfl: 3   spironolactone (ALDACTONE) 25 MG tablet, Take 1 tablet (25 mg total) by mouth daily., Disp: 30 tablet, Rfl: 11   Tafamidis (VYNDAMAX) 61 MG CAPS, Take 1 capsule by mouth daily., Disp: 30 capsule, Rfl: 11   torsemide (DEMADEX) 20 MG tablet, Take  20 mg by mouth daily. Take 10mg  on Monday, Wednesday, Friday. Take 20mg  all other days., Disp: , Rfl:    albuterol (VENTOLIN HFA) 108 (90 Base) MCG/ACT inhaler, 1-2 puff every 6 hours for a week then as needed for wheezing., Disp: 6.7 g, Rfl: 0   DULoxetine (CYMBALTA) 30 MG capsule, Take 1 capsule (30 mg total) by mouth daily., Disp: 90 capsule, Rfl: 2   Semaglutide (RYBELSUS) 3 MG TABS, Take 1 tablet (3 mg total) by mouth daily., Disp: , Rfl:    spironolactone (ALDACTONE) 25 MG tablet, Take 1 tablet (25 mg total) by mouth daily., Disp: 30 tablet, Rfl: 4   torsemide (DEMADEX) 20 MG tablet, Take 1 tablet (20 mg total) by mouth every Monday, Wednesday, and Friday., Disp: 30 tablet, Rfl: 3 Allergies  Allergen Reactions   Bee Venom Anaphylaxis, Swelling and Other (See Comments)    Swells all over     Social History   Socioeconomic History   Marital status: Single    Spouse name: Not on file   Number of children: Not on file   Years of education: Not on file   Highest education level: Not on file  Occupational History   Not on file  Tobacco Use   Smoking status: Former    Current packs/day: 0.00    Types: Cigarettes    Quit date: 10/06/2017    Years since quitting: 5.6   Smokeless tobacco: Never  Substance and Sexual Activity   Alcohol use: Yes    Comment: Occasional Beer   Drug use: Yes    Types: Marijuana   Sexual activity: Not on file  Other Topics Concern   Not on file  Social History Narrative   Right Handed.    Lives in a two story home    Lives with 15 year old son.    Social Drivers of Corporate investment banker Strain: Low Risk  (01/18/2022)   Overall Financial Resource Strain (CARDIA)    Difficulty of Paying Living Expenses: Not hard at all  Food Insecurity: No Food Insecurity (09/21/2022)   Hunger Vital Sign    Worried About Running Out of Food in the Last Year: Never true    Ran Out of Food in the Last Year: Never true  Transportation Needs: No Transportation  Needs (06/04/2022)   PRAPARE - Administrator, Civil Service (Medical): No    Lack of Transportation (Non-Medical): No  Physical Activity: Inactive (01/18/2022)   Exercise Vital Sign    Days of Exercise per Week: 0 days    Minutes of Exercise per Session: 0 min  Stress: Stress Concern Present (01/18/2022)   [employer] of Occupational Health - Occupational Stress Questionnaire    Feeling of Stress : To some extent  Social Connections: Socially Isolated (01/18/2022)   Social Connection and Isolation Panel [NHANES]    Frequency of Communication with Friends and Family: More than three times a week  Frequency of Social Gatherings with Friends and Family: More than three times a week    Attends Religious Services: Never    Database administrator or Organizations: No    Attends Banker Meetings: Never    Marital Status: Never married  Intimate Partner Violence: Not At Risk (01/18/2022)   Humiliation, Afraid, Rape, and Kick questionnaire    Fear of Current or Ex-Partner: No    Emotionally Abused: No    Physically Abused: No    Sexually Abused: No    Physical Exam      No future appointments.

## 2023-05-11 NOTE — Assessment & Plan Note (Addendum)
He is reporting improvement of symptoms with Duloxetine 30 mg daily, so no changes. We discussed Dx and prognosis. Stressed the importance of appropriate foot care.

## 2023-05-11 NOTE — Progress Notes (Signed)
HPI: Thomas West is a 62 y.o. male with a PMHx significant for atherosclerosis of aorta, CAD, HFrEF, HTN, GERD, HLD, DM II, polyneuropathy, CKD III, MI, and CVA, who is here today for chronic disease management.  Last seen on 09/24/2022  Diabetes Mellitus II:  - Checking BG at home: He has been checking his BG at home and it is normally in the 120s.  - Medications: He states that he is not taken any of his diabetes meds x 3-4 months because "everything was very good. According to pt, his cardiologist told him to stop them, later during the visit he states that he is not sure what he is taking because his meds are arrange in pill cases by [facility] nurse every 1-2 weeks. He is supposed to be on Jardiance 25 mg daily and Tradjenta 5 mg daily.  - Diet: He mentions he has been following a modified diet to keep his sugar down.  - eye exam: He says he is due for an eye exam.  - Foot exam: Performed on 5/3 visit.  - Negative for symptoms of hypoglycemia, polyuria, polydipsia, foot ulcers/trauma  Peripheral neuropathy: Last visit duloxetine 30 mg daily was started, which he says is helping.   Lab Results  Component Value Date   HGBA1C 8.1 (H) 09/24/2022   Lab Results  Component Value Date   MICROALBUR 17.5 (H) 09/24/2022   Hypertension:  Medications: Currently on spironolactone 25 mg daily, torsemide 20 mg 3x weekly, Entresto 97-103 mg bid, hydralazine 100 mg 3x daily, and carvedilol 6.25 mg bid.  He has been trying to avoid salt in his diet.  He has seen his cardiologist. He follows with them every 6 months.  Mentions that his EF has improved, he underwent echocardiogram on 02/02/2023: LVEF 40 to 45%, left ventricular diastolic parameters are indeterminate. 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  Negative for unusual or severe headache, visual changes, exertional chest pain, dyspnea,  focal weakness, or edema. CKD III: He has no gross  hematuria, foam in urine, or decreased urine output.  Lab Results  Component Value Date   NA 139 03/16/2023   CL 103 03/16/2023   K 4.6 03/16/2023   CO2 27 03/16/2023   BUN 22 (H) 03/16/2023   CREATININE 1.80 (H) 03/16/2023   GFRNONAA 43 (L) 03/16/2023   CALCIUM 9.0 03/16/2023   PHOS 4.9 (H) 07/15/2021   ALBUMIN 3.5 06/01/2022   GLUCOSE 129 (H) 03/16/2023   Hyperlipidemia:Currently on Zetia 10 mg daily and atorvastatin 80 mg daily.   Lab Results  Component Value Date   CHOL 153 09/24/2022   HDL 43.20 09/24/2022   LDLCALC 90 09/24/2022   TRIG 102.0 09/24/2022   CHOLHDL 4 09/24/2022   Review of Systems  Constitutional:  Positive for fatigue. Negative for activity change, appetite change, chills and fever.  HENT:  Negative for mouth sores and sore throat.   Respiratory:  Negative for cough and wheezing.   Gastrointestinal:  Negative for abdominal pain, nausea and vomiting.  Skin:  Negative for rash.  Neurological:  Negative for syncope and facial asymmetry.  Psychiatric/Behavioral:  Negative for confusion and hallucinations.   See other pertinent positives and negatives in HPI.  Current Outpatient Medications on File Prior to Visit  Medication Sig Dispense Refill   aspirin EC 81 MG tablet Take 1 tablet (81 mg total) by mouth daily. Swallow whole. 30 tablet 11   atorvastatin (LIPITOR) 80 MG  tablet Take 1 tablet (80 mg total) by mouth daily. 90 tablet 3   carvedilol (COREG) 6.25 MG tablet Take 1 tablet (6.25 mg total) by mouth 2 (two) times daily. 180 tablet 3   clopidogrel (PLAVIX) 75 MG tablet Take 1 tablet (75 mg total) by mouth daily. 90 tablet 0   cyanocobalamin (VITAMIN B12) 1000 MCG tablet TAKE ONE TABLET BY MOUTH DAILY 30 tablet 3   DULoxetine (CYMBALTA) 30 MG capsule Take 1 capsule (30 mg total) by mouth daily. 30 capsule 0   empagliflozin (JARDIANCE) 25 MG TABS tablet Take 1 tablet (25 mg total) by mouth daily before breakfast. 90 tablet 0   ezetimibe (ZETIA) 10 MG  tablet Take 1 tablet (10 mg total) by mouth daily. 30 tablet 3   hydrALAZINE (APRESOLINE) 50 MG tablet Take 2 tablets (100 mg total) by mouth 3 (three) times daily. 180 tablet 11   hydrocortisone cream 1 % Apply 1 Application topically 2 (two) times daily as needed for itching. 45 g 1   isosorbide mononitrate (IMDUR) 60 MG 24 hr tablet Take 1.5 tablets (90 mg total) by mouth daily. 45 tablet 6   linagliptin (TRADJENTA) 5 MG TABS tablet Take 1 tablet (5 mg total) by mouth daily. 30 tablet 3   sacubitril-valsartan (ENTRESTO) 97-103 MG Take 1 tablet by mouth 2 (two) times daily. 60 tablet 3   spironolactone (ALDACTONE) 25 MG tablet Take 1 tablet (25 mg total) by mouth daily. 30 tablet 11   spironolactone (ALDACTONE) 25 MG tablet Take 1 tablet (25 mg total) by mouth daily. (Patient not taking: Reported on 05/11/2023) 30 tablet 4   Tafamidis (VYNDAMAX) 61 MG CAPS Take 1 capsule by mouth daily. 30 capsule 11   torsemide (DEMADEX) 20 MG tablet Take 20 mg by mouth daily. Take 10mg  on Monday, Wednesday, Friday. Take 20mg  all other days.     torsemide (DEMADEX) 20 MG tablet Take 1 tablet (20 mg total) by mouth every Monday, Wednesday, and Friday. (Patient not taking: Reported on 05/11/2023) 30 tablet 3   No current facility-administered medications on file prior to visit.    Past Medical History:  Diagnosis Date   CHF (congestive heart failure) (HCC)    Coronary artery disease    Diabetes mellitus without complication (HCC)    History of MI (myocardial infarction) 03/05/2019   Hypertension    Stroke (HCC)    Allergies  Allergen Reactions   Bee Venom Anaphylaxis, Swelling and Other (See Comments)    Swells all over    Social History   Socioeconomic History   Marital status: Single    Spouse name: Not on file   Number of children: Not on file   Years of education: Not on file   Highest education level: Not on file  Occupational History   Not on file  Tobacco Use   Smoking status: Former     Current packs/day: 0.00    Types: Cigarettes    Quit date: 10/06/2017    Years since quitting: 5.5   Smokeless tobacco: Never  Substance and Sexual Activity   Alcohol use: Yes    Comment: Occasional Beer   Drug use: Yes    Types: Marijuana   Sexual activity: Not on file  Other Topics Concern   Not on file  Social History Narrative   Right Handed.    Lives in a two story home    Lives with 45 year old son.    Social Drivers of Health  Financial Resource Strain: Low Risk  (01/18/2022)   Overall Financial Resource Strain (CARDIA)    Difficulty of Paying Living Expenses: Not hard at all  Food Insecurity: No Food Insecurity (09/21/2022)   Hunger Vital Sign    Worried About Running Out of Food in the Last Year: Never true    Ran Out of Food in the Last Year: Never true  Transportation Needs: No Transportation Needs (06/04/2022)   PRAPARE - Administrator, Civil Service (Medical): No    Lack of Transportation (Non-Medical): No  Physical Activity: Inactive (01/18/2022)   Exercise Vital Sign    Days of Exercise per Week: 0 days    Minutes of Exercise per Session: 0 min  Stress: Stress Concern Present (01/18/2022)   [employer] of Occupational Health - Occupational Stress Questionnaire    Feeling of Stress : To some extent  Social Connections: Socially Isolated (01/18/2022)   Social Connection and Isolation Panel [NHANES]    Frequency of Communication with Friends and Family: More than three times a week    Frequency of Social Gatherings with Friends and Family: More than three times a week    Attends Religious Services: Never    Database administrator or Organizations: No    Attends Engineer, structural: Never    Marital Status: Never married    Today's Vitals   05/11/23 1245  BP: 120/82  Pulse: 100  Resp: 16  Temp: 98.8 F (37.1 C)  TempSrc: Oral  SpO2: 95%  Weight: 173 lb 2 oz (78.5 kg)  Height: 5\' 9"  (1.753 m)   Body mass index is 25.57  kg/m. Wt Readings from Last 3 Encounters:  05/11/23 173 lb 2 oz (78.5 kg)  05/11/23 170 lb 9.6 oz (77.4 kg)  05/04/23 165 lb 9.6 oz (75.1 kg)  09/24/22 155 Lb  Physical Exam Vitals and nursing note reviewed.  Constitutional:      General: He is not in acute distress.    Appearance: He is well-developed.  HENT:     Head: Normocephalic and atraumatic.     Mouth/Throat:     Mouth: Mucous membranes are moist.  Eyes:     Conjunctiva/sclera: Conjunctivae normal.  Cardiovascular:     Rate and Rhythm: Normal rate and regular rhythm.     Pulses:          Dorsalis pedis pulses are 2+ on the right side and 2+ on the left side.     Heart sounds: No murmur heard. Pulmonary:     Effort: Pulmonary effort is normal. No respiratory distress.     Breath sounds: No decreased air movement. Wheezing present. No rhonchi or rales.     Comments: Minimal wheezing at the end of expiration.  Abdominal:     Palpations: Abdomen is soft. There is no hepatomegaly or mass.     Tenderness: There is no abdominal tenderness.  Genitourinary:    Rectum: Guaiac result negative.  Musculoskeletal:     Right lower leg: No edema.     Left lower leg: No edema.  Lymphadenopathy:     Cervical: No cervical adenopathy.  Skin:    General: Skin is warm.     Findings: No erythema or rash.  Neurological:     Mental Status: He is alert and oriented to person, place, and time.     Cranial Nerves: No cranial nerve deficit.     Gait: Gait normal.  Psychiatric:  Mood and Affect: Mood and affect normal.   ASSESSMENT AND PLAN:  Mr. Hosick was seen today for chronic disease management.   Lab Results  Component Value Date   HGBA1C 7.8 (A) 05/11/2023   Type 2 diabetes mellitus with diabetic neuropathy, without long-term current use of insulin (HCC) Assessment & Plan: HgA1C improved but still not at goal. Today 7.8 (8.1). He tells me he is not taking "diabetic medications", he is going to reviewed meds at home and  let me know if there is something on his list he is not on. We do not have many options due to his other co-morbilities. He is not interested in injectable medication. I recommended trying Rybelsus 3 mg, samples given. We discussed some side effects and risk of interaction if taken at the same time that Tradjenta, so recommend taking it 3-4 hours apart. Consulted with our pharmacist, Delano Metz. No changes in [place]. Continue monitoring BS's. He is overdue for eye exam. Continue appropriate foot and dental care. F/U in 3-4 months.  Orders: -     POCT glycosylated hemoglobin (Hb A1C) -     Rybelsus; Take 1 tablet (3 mg total) by mouth daily.  Hypertension, essential, benign Assessment & Plan: Today DBP mildly elevated, he reports that at home he has been told his BP is "good." Current on carvedilol 6.25 mg twice daily, hydralazine 50 mg 3 times daily, Imdur 30 mg daily, spironolactone 25 mg daily, and Entresto 97-103 mg bid (for CHF). Continue monitoring BP regularly. Low-salt diet  to continue. He is due for eye exam.  Chronic kidney disease, stage 3a (HCC) Assessment & Plan: Problem is otherwise stable, Cr and e GFR have fluctuated from 1.2-2.2 and 32-43->60 for the past few months respectively. Continue adequate hydration, as well as good BP and glucose controlled. Low salt diet recommended and continue avoidance of NSAID's. He is on Jardiance 25 mg daily.  Polyneuropathy associated with underlying disease (HCC) Assessment & Plan: He is reporting improvement of symptoms with Duloxetine 30 mg daily, so no changes. We discussed Dx and prognosis. Stressed the importance of appropriate foot care.  Orders: -     DULoxetine HCl; Take 1 capsule (30 mg total) by mouth daily.  Dispense: 90 capsule; Refill: 2  Wheezing Very mild, noted on examination at the end of force expiration.? COPD. He has not noted cough,SOB,or wheezing. Recommend Albuterol inh 2 puff every 6 hours for  a week then as needed for wheezing or shortness of breath.  Monitor for new symptoms. For now we can hold on further testing.  -     Albuterol Sulfate HFA; 1-2 puff every 6 hours for a week then as needed for wheezing.  Dispense: 8 g; Refill: 0  I spent a total of 41 minutes in both face to face and non face to face activities for this visit on the date of this encounter. During this time history was obtained and documented, examination was performed, prior labs/imaging reviewed, and assessment/plan discussed. Missed call from GI for colon cancer screening, phone number given,so he can call.  Return in about 15 weeks (around 08/24/2023).  I, Suanne Marker, acting as a scribe for Tanaiya Kolarik [name], MD., have documented all relevant documentation on the behalf of Oumou Smead [name], MD, as directed by  Nevin Grizzle [name], MD while in the presence of Germaine Ripp [name], MD.   I, Suanne Marker, have reviewed all documentation for this visit. The documentation on 05/11/23 for the exam, diagnosis, procedures, and  orders are all accurate and complete.  Jadesola Poynter G. [name], MD  [facility] ([facility] [place] office.

## 2023-05-11 NOTE — Patient Instructions (Addendum)
A few things to remember from today's visit:  Type 2 diabetes mellitus with diabetic neuropathy, without long-term current use of insulin (HCC) - Plan: POC HgB A1c, Semaglutide (RYBELSUS) 3 MG TABS  Hypertension, essential, benign  Chronic kidney disease, stage 3a (HCC)  Polyneuropathy associated with underlying disease (HCC)   This is the number for gastroenterologist, you can call and arrange appt: Located in: [facility] [address] Address: [address], [place], [place] [address] Phone: [phone]  Today Rybelsus added, 3 mg. Take it 3-4 hours apart from Trajenta and ideally not with other medications. Let me know in 30 days if medication has helped and it you have any side effect.  If you need refills for medications you take chronically, please call your pharmacy. Do not use My Chart to request refills or for acute issues that need immediate attention. If you send a my chart message, it may take a few days to be addressed, specially if I am not in the office.  Please be sure medication list is accurate. If a new problem present, please set up appointment sooner than planned today.

## 2023-05-12 ENCOUNTER — Other Ambulatory Visit: Payer: Self-pay

## 2023-05-12 NOTE — Progress Notes (Signed)
Specialty Pharmacy Refill Coordination Note  Thomas West is a 62 y.o. male contacted today regarding refills of specialty medication(s) Tafamidis [name])   Patient requested [name] at [facility] at [place] date: 05/23/23   Medication will be filled on 12.30.24 due to insurance.

## 2023-05-20 ENCOUNTER — Other Ambulatory Visit: Payer: Self-pay

## 2023-05-20 ENCOUNTER — Other Ambulatory Visit (HOSPITAL_COMMUNITY): Payer: Self-pay

## 2023-05-20 ENCOUNTER — Other Ambulatory Visit (HOSPITAL_COMMUNITY): Payer: Self-pay | Admitting: Internal Medicine

## 2023-05-20 MED ORDER — ENTRESTO 97-103 MG PO TABS
1.0000 | ORAL_TABLET | Freq: Two times a day (BID) | ORAL | 3 refills | Status: DC
  Filled 2023-05-20: qty 60, 30d supply, fill #0
  Filled 2023-06-20: qty 60, 30d supply, fill #1
  Filled 2023-07-27: qty 60, 30d supply, fill #2
  Filled 2023-08-26: qty 60, 30d supply, fill #3

## 2023-05-23 ENCOUNTER — Other Ambulatory Visit: Payer: Self-pay

## 2023-05-27 ENCOUNTER — Other Ambulatory Visit: Payer: Self-pay

## 2023-05-27 ENCOUNTER — Other Ambulatory Visit (HOSPITAL_COMMUNITY): Payer: Self-pay | Admitting: Emergency Medicine

## 2023-05-27 ENCOUNTER — Other Ambulatory Visit (HOSPITAL_COMMUNITY): Payer: Self-pay

## 2023-05-27 ENCOUNTER — Emergency Department (HOSPITAL_COMMUNITY): Payer: No Typology Code available for payment source

## 2023-05-27 ENCOUNTER — Emergency Department (HOSPITAL_COMMUNITY)
Admission: EM | Admit: 2023-05-27 | Discharge: 2023-05-27 | Disposition: A | Discharge status: Home / Self Care | Payer: No Typology Code available for payment source | Attending: Emergency Medicine | Admitting: Emergency Medicine

## 2023-05-27 ENCOUNTER — Encounter (HOSPITAL_COMMUNITY): Payer: Self-pay

## 2023-05-27 DIAGNOSIS — I130 Hypertensive heart and chronic kidney disease with heart failure and stage 1 through stage 4 chronic kidney disease, or unspecified chronic kidney disease: Secondary | ICD-10-CM | POA: Diagnosis not present

## 2023-05-27 DIAGNOSIS — R7989 Other specified abnormal findings of blood chemistry: Secondary | ICD-10-CM | POA: Insufficient documentation

## 2023-05-27 DIAGNOSIS — N183 Chronic kidney disease, stage 3 unspecified: Secondary | ICD-10-CM | POA: Diagnosis not present

## 2023-05-27 DIAGNOSIS — R000 Tachycardia, unspecified: Secondary | ICD-10-CM | POA: Diagnosis not present

## 2023-05-27 DIAGNOSIS — R0602 Shortness of breath: Secondary | ICD-10-CM | POA: Insufficient documentation

## 2023-05-27 DIAGNOSIS — Z7982 Long term (current) use of aspirin: Secondary | ICD-10-CM | POA: Insufficient documentation

## 2023-05-27 DIAGNOSIS — Z79899 Other long term (current) drug therapy: Secondary | ICD-10-CM | POA: Diagnosis not present

## 2023-05-27 DIAGNOSIS — R600 Localized edema: Secondary | ICD-10-CM | POA: Insufficient documentation

## 2023-05-27 DIAGNOSIS — I110 Hypertensive heart disease with heart failure: Secondary | ICD-10-CM | POA: Diagnosis not present

## 2023-05-27 DIAGNOSIS — I2510 Atherosclerotic heart disease of native coronary artery without angina pectoris: Secondary | ICD-10-CM | POA: Diagnosis not present

## 2023-05-27 DIAGNOSIS — Z7902 Long term (current) use of antithrombotics/antiplatelets: Secondary | ICD-10-CM | POA: Diagnosis not present

## 2023-05-27 DIAGNOSIS — I5022 Chronic systolic (congestive) heart failure: Secondary | ICD-10-CM | POA: Diagnosis not present

## 2023-05-27 DIAGNOSIS — I509 Heart failure, unspecified: Secondary | ICD-10-CM

## 2023-05-27 DIAGNOSIS — E1122 Type 2 diabetes mellitus with diabetic chronic kidney disease: Secondary | ICD-10-CM | POA: Diagnosis not present

## 2023-05-27 DIAGNOSIS — R079 Chest pain, unspecified: Secondary | ICD-10-CM | POA: Diagnosis not present

## 2023-05-27 DIAGNOSIS — R0789 Other chest pain: Secondary | ICD-10-CM | POA: Insufficient documentation

## 2023-05-27 LAB — D-DIMER, QUANTITATIVE: D-Dimer, Quant: <0.27 ug{FEU}/mL (ref 0.00–0.50)

## 2023-05-27 LAB — TROPONIN I (HIGH SENSITIVITY)
Troponin I (High Sensitivity): 32 ng/L — ABNORMAL HIGH (ref <18)
Troponin I (High Sensitivity): 33 ng/L — ABNORMAL HIGH (ref <18)

## 2023-05-27 LAB — BASIC METABOLIC PANEL
Anion gap: 11 (ref 5–15)
BUN: 16 mg/dL (ref 6–20)
CO2: 30 mmol/L (ref 22–32)
Calcium: 9 mg/dL (ref 8.9–10.3)
Chloride: 100 mmol/L (ref 98–111)
Creatinine, Ser: 1.34 mg/dL — ABNORMAL HIGH (ref 0.61–1.24)
GFR, Estimated: >60 mL/min (ref >60)
Glucose, Bld: 202 mg/dL — ABNORMAL HIGH (ref 70–99)
Potassium: 4.4 mmol/L (ref 3.5–5.1)
Sodium: 141 mmol/L (ref 135–145)

## 2023-05-27 LAB — CBC
HCT: 42.3 % (ref 39.0–52.0)
Hemoglobin: 13.7 g/dL (ref 13.0–17.0)
MCH: 28 pg (ref 26.0–34.0)
MCHC: 32.4 g/dL (ref 30.0–36.0)
MCV: 86.3 fL (ref 80.0–100.0)
Platelets: 357 10*3/uL (ref 150–400)
RBC: 4.9 MIL/uL (ref 4.22–5.81)
RDW: 15.5 % (ref 11.5–15.5)
WBC: 10.5 10*3/uL (ref 4.0–10.5)
nRBC: 0 % (ref 0.0–0.2)

## 2023-05-27 LAB — BRAIN NATRIURETIC PEPTIDE: B Natriuretic Peptide: 1478.4 pg/mL — ABNORMAL HIGH (ref 0.0–100.0)

## 2023-05-27 MED ORDER — FUROSEMIDE 10 MG/ML IJ SOLN
20.0000 mg | Freq: Once | INTRAMUSCULAR | Status: AC
  Administered 2023-05-27: 20 mg via INTRAVENOUS
  Filled 2023-05-27: qty 2

## 2023-05-27 MED ORDER — FUROSEMIDE 10 MG/ML IJ SOLN
40.0000 mg | Freq: Once | INTRAMUSCULAR | Status: AC
  Administered 2023-05-27: 40 mg via INTRAVENOUS
  Filled 2023-05-27: qty 4

## 2023-05-27 MED ORDER — TORSEMIDE 20 MG PO TABS
20.0000 mg | ORAL_TABLET | Freq: Every day | ORAL | 3 refills | Status: DC
  Filled 2023-05-27: qty 30, 30d supply, fill #0

## 2023-05-27 NOTE — ED Triage Notes (Signed)
 Hx of heart failure and has had sob, leg swelling and abd swelling.  Brought in by nurse. Patient sees bensihmon.

## 2023-05-27 NOTE — Progress Notes (Signed)
 Med rec only for this visit.  Mr. Afzal was seen in the ED today and discharged w/ med change of 20mg . Torsemide  1 tablet daily.   Med box reconciled x 1 week to reflect this change.  Will follow up with pt on Tuesday to access pt post med change.    [name], EMT-Paramedic [phone] 05/27/2023

## 2023-05-27 NOTE — ED Notes (Signed)
 Pt states he does not ambulate, reports using a wheelchair at home. PA made aware.

## 2023-05-27 NOTE — Discharge Instructions (Addendum)
 Evaluation today revealed that you were likely in acute heart failure.  You did respond well to IV Lasix .  At this time I do feel is appropriate given how well he responded to treatment to be discharged with close follow-up with your cardiologist.  I would plan to schedule an appointment as soon as possible.  In the meantime recommend that you take your torsemide  daily.  Have sent a refill to your pharmacy.  If you have chest pain, shortness of breath, calf tenderness, develop a fever and cough or any other concerning symptom please return emergency department further evaluation.

## 2023-05-27 NOTE — ED Notes (Signed)
 Patient verbalizes understanding of discharge instructions. Opportunity for questioning and answers were provided. Armband removed by staff, pt discharged from ED. Pt taken to ED waiting room via wheel chair.

## 2023-05-27 NOTE — ED Provider Triage Note (Signed)
 Emergency Medicine Provider Triage Evaluation Note  Thomas West , a 63 y.o. male  was evaluated in triage.  Pt complains of fluid.  Review of Systems  Positive: Short of breath, chest tightness, BLE edema, wet cough Negative: Abdominal pain, chest pain, NVD, chills  Physical Exam  BP (!) 172/92 (BP Location: Left Arm)   Pulse (!) 117   Temp 98.5 F (36.9 C) (Oral)   Resp 19   Ht 5' 9 (1.753 m)   Wt 78.5 kg   SpO2 94%   BMI 25.55 kg/m  Gen:   Awake, no distress   Resp:  Normal effort  MSK:   Moves extremities without difficulty  Other:  Pitting edema bilaterally to knees  Medical Decision Making  Medically screening exam initiated at 11:11 AM.  Appropriate orders placed.  Thomas West was informed that the remainder of the evaluation will be completed by another provider, this initial triage assessment does not replace that evaluation, and the importance of remaining in the ED until their evaluation is complete.  Patient presenting with 3 days of increased fluid.  Patient is tachycardic.  Patient reports he is at increased shortness of breath when lying down flat or with exertion.  Patient reports been taking all of his medications but was recently on vacation.  Reports the last time he felt like this he had to be admitted.   Shermon Warren SAILOR, PA-C 05/27/23 1113

## 2023-05-27 NOTE — ED Provider Notes (Addendum)
 [place] [facility] AT [facility] Provider Note   CSN: [id number] Arrival date & time: 05/27/23  1038     History  Chief Complaint  Patient presents with   Chest Pain   Shortness of Breath    Thomas West is a 63 y.o. male with a past medical history of CAD, MI, T2DM, HLD, GERD, HTN, HFrEF (40%), CKD 3, peripheral neuropathy presents emergency department for evaluation of chest tightness, pedal edema, ascites, shortness of breath over the past 3 to 4 days.  He reports that he just drove 3.5 hours to [facility] for vacation. He has to be elevated in bed to sleep at night as being supine worsens chest tightness and SOB. He denies cough, fever.   Chest Pain Associated symptoms: shortness of breath   Associated symptoms: no abdominal pain, no cough, no dizziness, no fatigue, no fever, no headache, no nausea, no numbness, no palpitations, no vomiting and no weakness   Shortness of Breath Associated symptoms: chest pain   Associated symptoms: no abdominal pain, no cough, no fever, no headaches, no vomiting and no wheezing      Home Medications Prior to Admission medications   Medication Sig Start Date End Date Taking? Authorizing Provider  albuterol  (VENTOLIN  HFA) 108 (90 Base) MCG/ACT inhaler 1-2 puff every 6 hours for a week then as needed for wheezing. 05/11/23  Yes Jordan, [name], MD  aspirin  EC 81 MG tablet Take 1 tablet (81 mg total) by mouth daily. Swallow whole. 01/21/23  Yes Hayes Beckey CROME, NP  atorvastatin  (LIPITOR ) 80 MG tablet Take 1 tablet (80 mg total) by mouth daily. 02/11/23  Yes Bensimhon, Toribio SAUNDERS, MD  carvedilol  (COREG ) 6.25 MG tablet Take 1 tablet (6.25 mg total) by mouth 2 (two) times daily. 10/20/22  Yes Hayes Beckey CROME, NP  clopidogrel  (PLAVIX ) 75 MG tablet Take 1 tablet (75 mg total) by mouth daily. 03/28/23 06/26/23 Yes Jordan, [name], MD  cyanocobalamin  (VITAMIN B12) 1000 MCG tablet TAKE ONE TABLET BY MOUTH DAILY 12/29/22  Yes Jordan, [name], MD   DULoxetine  (CYMBALTA ) 30 MG capsule Take 1 capsule (30 mg total) by mouth daily. 05/11/23  Yes Jordan, [name], MD  empagliflozin  (JARDIANCE ) 25 MG TABS tablet Take 1 tablet (25 mg total) by mouth daily before breakfast. 04/26/23  Yes Jordan, [name], MD  ezetimibe  (ZETIA ) 10 MG tablet Take 1 tablet (10 mg total) by mouth daily. 03/28/23  Yes Jordan, [name], MD  hydrALAZINE  (APRESOLINE ) 50 MG tablet Take 2 tablets (100 mg total) by mouth 3 (three) times daily. 02/11/23  Yes Bensimhon, Toribio SAUNDERS, MD  hydrocortisone  cream 1 % Apply 1 Application topically 2 (two) times daily as needed for itching. 05/25/22  Yes Jordan, [name], MD  isosorbide  mononitrate (IMDUR ) 60 MG 24 hr tablet Take 1.5 tablets (90 mg total) by mouth daily. 04/27/23  Yes [place], Harlene HERO, FNP  linagliptin  (TRADJENTA ) 5 MG TABS tablet Take 1 tablet (5 mg total) by mouth daily. 03/28/23  Yes Jordan, [name], MD  sacubitril -valsartan  (ENTRESTO ) 97-103 MG Take 1 tablet by mouth 2 (two) times daily. 05/20/23  Yes Bensimhon, Toribio SAUNDERS, MD  Semaglutide  (RYBELSUS ) 3 MG TABS Take 1 tablet (3 mg total) by mouth daily. 05/11/23  Yes Jordan, [name], MD  spironolactone  (ALDACTONE ) 25 MG tablet Take 1 tablet (25 mg total) by mouth daily. 10/21/22  Yes Bensimhon, Toribio SAUNDERS, MD  Tafamidis  (VYNDAMAX ) 61 MG CAPS Take 1 capsule by mouth daily.  11/24/22  Yes Bensimhon, Toribio SAUNDERS, MD  torsemide  (DEMADEX ) 20 MG tablet Take 1 tablet (20 mg total) by mouth every Monday, Wednesday, and Friday. Patient taking differently: Take 10-20 mg by mouth See admin instructions. Take 10 mg (1/2 tablet) once daily on Monday, Wednesday, Friday. Take 20mg  (1 tablet) once daily on all other days. 04/06/23  Yes Bensimhon, Toribio SAUNDERS, MD      Allergies    Bee venom    Review of Systems   Review of Systems  Constitutional:  Negative for chills, fatigue and fever.  Respiratory:  Positive for shortness of breath. Negative for cough, chest tightness and wheezing.   Cardiovascular:   Positive for chest pain. Negative for palpitations.  Gastrointestinal:  Negative for abdominal pain, constipation, diarrhea, nausea and vomiting.  Neurological:  Negative for dizziness, seizures, weakness, light-headedness, numbness and headaches.    Physical Exam Updated Vital Signs BP (!) 124/107   Pulse (!) 103   Temp 98.5 F (36.9 C) (Oral)   Resp (!) 21   Ht 5' 9 (1.753 m)   Wt 78.5 kg   SpO2 96%   BMI 25.55 kg/m  Physical Exam Vitals and nursing note reviewed.  Constitutional:      General: He is not in acute distress.    Appearance: Normal appearance.  HENT:     Head: Normocephalic and atraumatic.  Eyes:     Conjunctiva/sclera: Conjunctivae normal.  Neck:     Vascular: No JVD.  Cardiovascular:     Rate and Rhythm: Regular rhythm. Tachycardia present.     Pulses:          Radial pulses are 2+ on the right side and 2+ on the left side.       Dorsalis pedis pulses are 2+ on the right side and 2+ on the left side.  Pulmonary:     Effort: Pulmonary effort is normal. Tachypnea present. No respiratory distress.  Abdominal:     General: Bowel sounds are normal. There is distension.     Palpations: Abdomen is soft. There is fluid wave.     Tenderness: There is no abdominal tenderness. There is no guarding or rebound.  Musculoskeletal:     Cervical back: Full passive range of motion without pain.     Right lower leg: 1+ Pitting Edema present.     Left lower leg: 1+ Pitting Edema present.  Skin:    General: Skin is warm.     Capillary Refill: Capillary refill takes less than 2 seconds.     Coloration: Skin is not jaundiced or pale.  Neurological:     General: No focal deficit present.     Mental Status: He is alert and oriented to person, place, and time. Mental status is at baseline.    ED Results / Procedures / Treatments   Labs (all labs ordered are listed, but only abnormal results are displayed) Labs Reviewed  BASIC METABOLIC PANEL - Abnormal; Notable for the  following components:      Result Value   Glucose, Bld 202 (*)    Creatinine, Ser 1.34 (*)    All other components within normal limits  BRAIN NATRIURETIC PEPTIDE - Abnormal; Notable for the following components:   B Natriuretic Peptide 1,478.4 (*)    All other components within normal limits  TROPONIN I (HIGH SENSITIVITY) - Abnormal; Notable for the following components:   Troponin I (High Sensitivity) 32 (*)    All other components within normal limits  TROPONIN I (  HIGH SENSITIVITY) - Abnormal; Notable for the following components:   Troponin I (High Sensitivity) 33 (*)    All other components within normal limits  CBC  D-DIMER, QUANTITATIVE    EKG None  Radiology DG Chest Port 1 View Result Date: 05/27/2023 CLINICAL DATA:  Shortness of breath and chest pain EXAM: PORTABLE CHEST 1 VIEW COMPARISON:  Chest radiograph dated 05/31/2022 FINDINGS: Normal lung volumes. No focal consolidations. No pleural effusion or pneumothorax. The heart size and mediastinal contours are within normal limits. No acute osseous abnormality. IMPRESSION: No active disease. Electronically Signed   By: Limin  Xu M.D.   On: 05/27/2023 13:18    Procedures Procedures    Medications Ordered in ED Medications  furosemide  (LASIX ) injection 40 mg (40 mg Intravenous Given 05/27/23 1408)  furosemide  (LASIX ) injection 20 mg (20 mg Intravenous Given 05/27/23 1426)    ED Course/ Medical Decision Making/ A&P Clinical Course as of 05/27/23 1524  Fri May 27, 2023  1352 Creatinine(!): 1.34 Baseline 1.27-2.29 over past month [LB]  1355 Glucose(!): 202 Baseline 129-207 over past year. No anion gap [LB]  1508 Here SOB. BNP elevated. Chest tightness. Diurese. Reassess. Looks better can dc with cards f/u. 20 mg of Torsomide qday at home.  [JR]    Clinical Course User Index [JR] Lang Norleen POUR, PA-C [LB] Minnie Tinnie BRAVO, PA                                 Medical Decision Making Amount and/or Complexity of Data  Reviewed Labs: ordered. Decision-making details documented in ED Course.  Risk Prescription drug management.   Patient presents to the ED for concern of SOB, CP, this involves an extensive number of treatment options, and is a complaint that carries with it a high risk of complications and morbidity.  The differential diagnosis includes ACS, CHF exacerbation, PNA, PE   Co morbidities that complicate the patient evaluation  CAD, MI, T2DM, HLD, GERD, HTN, HFrEF, CKD 3, peripheral neuropathy Chronic torsemide    Additional history obtained:  Additional history obtained from Nursing, Outside Medical Records, and Past Admission   External records from outside source obtained and reviewed including  Triage RN note Recent medical evals and medication list   Lab Tests:  I Ordered, and personally interpreted labs.  The pertinent results include:   First troponin 32 BNP 1,478 Glucose 202 Creatinine 1.34 (baseline 1.27-2.29) D-dimer negative   Imaging Studies ordered:  I ordered imaging studies including CXR  I independently visualized and interpreted imaging which showed no active cardiopulmonary disease I agree with the radiologist interpretation   Cardiac Monitoring:  The patient was maintained on a cardiac monitor.  I personally viewed and interpreted the cardiac monitored which showed an underlying rhythm of: Sinus tachycardia   Medicines ordered and prescription drug management:  I ordered medication including lasix  60mg  for diuresis  Reevaluation of the patient after these medicines showed that the patient improved I have reviewed the patients home medicines and have made adjustments as needed    Problem List / ED Course:  SOB CP Elevated BNP CXR negative for pulmonary edema. Has not needed supplemental O2 and remained WNL Torsemide  10 mg every Monday Wednesday Friday.  Takes torsemide  20 mg Tuesday Thursday Saturday Sunday - reports medication compliance BNP  1,478 and fluid overloaded on PE - given lasix  60mg  Trops flat. Upon reassessment, patient complains of continued chest tightness and sob. He  feels uncomfortable with being sent home due to continued symptoms despite lasix  Will admit for IV diuresis   Reevaluation:  After the interventions noted above, I reevaluated the patient and found that they have :stayed the same   Social Determinants of Health:  Has PCP and cardiology f/u   Dispostion:  After consideration of the diagnostic results and the patients response to treatment, I feel that the patent would benefit from admission for IV diuresis  Sign out to Norleen Essex PA pending admission  Dr. Mannie individually assessed patient and agrees with plan Final Clinical Impression(s) / ED Diagnoses Final diagnoses:  Shortness of breath  Elevated brain natriuretic peptide (BNP) level  Chest tightness    Rx / DC Orders ED Discharge Orders     None        Minnie Tinnie BRAVO, PA 05/27/23 1525    Mannie Pac T, DO 05/28/23 1122

## 2023-05-27 NOTE — ED Provider Notes (Signed)
 Accepted handoff at shift change from Tinnie Matter,  PA-C. Please see prior provider note for more detail.   Briefly: Patient is 63 y.o. with CAD, MI, T2DM, HLD, GERD, HTN, HFrEF (40%), CKD 3, peripheral neuropathy and wheelchair-bound presenting for shortness of breath and chest tightness.  DDX: concern for s ACS, CHF exacerbation, PNA, PE   Plan: Reassess.  Troponins are flat if symptoms have improved can consider discharge with cardiology follow-up.   Physical Exam  BP (!) 124/107   Pulse (!) 103   Temp 98.5 F (36.9 C) (Oral)   Resp (!) 21   Ht 5' 9 (1.753 m)   Wt 78.5 kg   SpO2 96%   BMI 25.55 kg/m   Physical Exam  Procedures  Procedures  ED Course / MDM   Clinical Course as of 05/27/23 1643  Fri May 27, 2023  1352 Creatinine(!): 1.34 Baseline 1.27-2.29 over past month [LB]  1355 Glucose(!): 202 Baseline 129-207 over past year. No anion gap [LB]  1508 Here SOB. BNP elevated. Chest tightness. Diurese. Reassess. Looks better can dc with cards f/u. 20 mg of Torsomide qday at home.  [JR]    Clinical Course User Index [JR] Lang Norleen POUR, PA-C [LB] Matter Tinnie BRAVO, PA   Medical Decision Making Amount and/or Complexity of Data Reviewed Labs: ordered. Decision-making details documented in ED Course.  Risk Prescription drug management.   On reassessment patient stated that chest tightness had resolved but and shortness of breath was improved but still present.  Sats remained in the mid to 90s on room air.  Patient was intermittently tachypneic and tachycardic.  Remained well appearing and in no acute distress.  Discussed patient with Dr. Bari and we mutually agreed that patient is appropriate to discharge with close follow-up with his cardiologist.  I did discuss appropriate return precautions.  Advised him to increase torsemide  20 mg to daily.  Discharged in good condition.       Lang Norleen POUR, PA-C 05/27/23 1648    Bari Charmaine FALCON, MD 05/31/23  [phone]

## 2023-05-27 NOTE — ED Provider Notes (Signed)
 Bedside cardiac ultrasound procedure note.  Subxiphoid, parasternal long, parasternal short, apical 4, IVC views obtained, all images saved.  Diminished EF, estimated 35 to 45%.  No pericardial effusion.  No significant IVC distention.  No right heart strain.   Mannie Pac T, DO 05/27/23 1146

## 2023-05-28 ENCOUNTER — Other Ambulatory Visit (HOSPITAL_COMMUNITY): Payer: Self-pay

## 2023-05-30 ENCOUNTER — Other Ambulatory Visit (HOSPITAL_COMMUNITY): Payer: Self-pay

## 2023-05-30 ENCOUNTER — Other Ambulatory Visit: Payer: Self-pay | Admitting: Family Medicine

## 2023-05-30 ENCOUNTER — Other Ambulatory Visit: Payer: Self-pay

## 2023-05-30 DIAGNOSIS — R062 Wheezing: Secondary | ICD-10-CM

## 2023-05-30 MED ORDER — ALBUTEROL SULFATE HFA 108 (90 BASE) MCG/ACT IN AERS
1.0000 | INHALATION_SPRAY | Freq: Four times a day (QID) | RESPIRATORY_TRACT | 0 refills | Status: DC
  Filled 2023-05-30: qty 6.7, 25d supply, fill #0

## 2023-06-01 ENCOUNTER — Other Ambulatory Visit (HOSPITAL_COMMUNITY): Payer: Self-pay

## 2023-06-01 ENCOUNTER — Other Ambulatory Visit: Payer: Self-pay

## 2023-06-01 ENCOUNTER — Telehealth (HOSPITAL_COMMUNITY): Payer: Self-pay | Admitting: Emergency Medicine

## 2023-06-01 ENCOUNTER — Other Ambulatory Visit (HOSPITAL_COMMUNITY): Payer: Self-pay | Admitting: Emergency Medicine

## 2023-06-01 ENCOUNTER — Other Ambulatory Visit: Payer: Self-pay | Admitting: Family Medicine

## 2023-06-01 DIAGNOSIS — E1140 Type 2 diabetes mellitus with diabetic neuropathy, unspecified: Secondary | ICD-10-CM

## 2023-06-01 MED ORDER — RYBELSUS 3 MG PO TABS
3.0000 mg | ORAL_TABLET | Freq: Every day | ORAL | 4 refills | Status: AC
  Filled 2023-06-01: qty 30, 30d supply, fill #0
  Filled 2023-08-04: qty 30, 30d supply, fill #1
  Filled 2023-08-31: qty 30, 30d supply, fill #2
  Filled 2023-09-27: qty 30, 30d supply, fill #3
  Filled 2023-11-10: qty 30, 30d supply, fill #4

## 2023-06-01 NOTE — Telephone Encounter (Signed)
 Created in error    Beatrix Shipper, EMT-Paramedic [phone] 06/01/2023

## 2023-06-01 NOTE — Progress Notes (Signed)
 Paramedicine Encounter    Patient ID: Thomas West, male    DOB: 06-Apr-1961, 63 y.o.   MRN: [id number]   Complaints NONE  Assessment:  A&O x 4, skin W&D w/ good color.  Lung sounds w/ some mild expiratory wheezing in upper lobes.  Denies chest pains or SOB.  No peripheral edema noted  Compliance with meds Missed 1 day dosing  Pill box filled x 1 week  Refills needed B12, Atorvastatin , Duloxetine   Meds changes since last visit Torsemide  increased from 20mg . Mon/Wed/Fri to 20mg . Tablet daily.   Social changes none   BP 128/80 (BP Location: Left Arm, Patient Position: Sitting)   Pulse 84   Resp 16   Wt 168 lb (76.2 kg)   SpO2 98%   BMI 24.81 kg/m  Weight yesterday- not taken Last visit weight-170lb  Thomas West reports today that he is feeling well.  He visited the ED on 05/27/23 due to feeling SOB and swelling in his legs.  He was sent home with instructions to start taking Torsemide  20mg  daily instead of Mon/Wed/Fri.   Also followed up on Semaglutide  prescription from Betty Jordan  This was ordered at his last visit with her but was never called into the pharmacy.  Pharmacy to reach back out to Dr. Jordan and will mail out once received.  Pt made aware of same. Med box reconciled x 1 week.  Refills called in.   Next home visit scheduled for 06/08/23 @ 11:00.  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   Pruritic rash 05/25/2022   Polyneuropathy associated with underlying disease (HCC) 01/15/2022   Atherosclerosis of aorta (HCC) 01/15/2022   History of CVA (cerebrovascular accident)  without residual deficits 12/24/2021   AKI (acute kidney injury) (HCC) 12/13/2021   Diarrhea 12/13/2021   Shortness of breath 12/13/2021   Chest pain 12/13/2021   Nausea and vomiting 12/13/2021   Heart failure (HCC) 12/08/2021   Hypertension associated with diabetes (HCC) 07/12/2021   Chronic kidney disease, stage 3a (HCC) 07/12/2021   Hyperkalemia 07/12/2021   CHF exacerbation (HCC) 04/20/2021   HFrEF (heart failure with reduced ejection fraction) (HCC) 02/03/2021   Diabetes mellitus (HCC) 03/27/2019   Hyperlipidemia associated with type 2 diabetes mellitus (HCC) 03/27/2019   CAD (coronary artery disease) 03/05/2019   History of MI (myocardial infarction) 03/05/2019   Type 2 diabetes mellitus with diabetic neuropathy, unspecified (HCC) 03/05/2019   HLD (hyperlipidemia) 03/05/2019   GERD (gastroesophageal reflux disease) 03/05/2019   Hypertension, essential, benign 03/05/2019    Current Outpatient Medications:    albuterol  (VENTOLIN  HFA) 108 (90 Base) MCG/ACT inhaler, Inhale 1-2 puffs into the lungs every 6 (six) hours for 1 week, then as needed., Disp: 6.7 g, Rfl: 0   aspirin  EC 81 MG tablet, Take 1 tablet (81 mg total) by mouth daily. Swallow whole., Disp: 30 tablet, Rfl: 11   atorvastatin  (LIPITOR ) 80 MG tablet, Take 1 tablet (80 mg total) by mouth daily., Disp: 90 tablet, Rfl: 3   carvedilol  (COREG ) 6.25 MG tablet, Take 1 tablet (6.25 mg total) by mouth 2 (two) times daily., Disp: 180 tablet, Rfl: 3   clopidogrel  (PLAVIX ) 75  MG tablet, Take 1 tablet (75 mg total) by mouth daily., Disp: 90 tablet, Rfl: 0   cyanocobalamin  (VITAMIN B12) 1000 MCG tablet, TAKE ONE TABLET BY MOUTH DAILY, Disp: 30 tablet, Rfl: 3   DULoxetine  (CYMBALTA ) 30 MG capsule, Take 1 capsule (30 mg total) by mouth daily., Disp: 90 capsule, Rfl: 2   empagliflozin  (JARDIANCE ) 25 MG TABS tablet, Take 1 tablet (25 mg total) by mouth daily before breakfast., Disp: 90 tablet, Rfl: 0   ezetimibe  (ZETIA ) 10 MG tablet, Take  1 tablet (10 mg total) by mouth daily., Disp: 30 tablet, Rfl: 3   hydrALAZINE  (APRESOLINE ) 50 MG tablet, Take 2 tablets (100 mg total) by mouth 3 (three) times daily., Disp: 180 tablet, Rfl: 11   hydrocortisone  cream 1 %, Apply 1 Application topically 2 (two) times daily as needed for itching., Disp: 45 g, Rfl: 1   isosorbide  mononitrate (IMDUR ) 60 MG 24 hr tablet, Take 1.5 tablets (90 mg total) by mouth daily., Disp: 45 tablet, Rfl: 6   linagliptin  (TRADJENTA ) 5 MG TABS tablet, Take 1 tablet (5 mg total) by mouth daily., Disp: 30 tablet, Rfl: 3   sacubitril -valsartan  (ENTRESTO ) 97-103 MG, Take 1 tablet by mouth 2 (two) times daily., Disp: 60 tablet, Rfl: 3   spironolactone  (ALDACTONE ) 25 MG tablet, Take 1 tablet (25 mg total) by mouth daily., Disp: 30 tablet, Rfl: 11   Tafamidis  (VYNDAMAX ) 61 MG CAPS, Take 1 capsule by mouth daily., Disp: 30 capsule, Rfl: 11   torsemide  (DEMADEX ) 20 MG tablet, Take 1 tablet (20 mg total) by mouth daily., Disp: 30 tablet, Rfl: 3   Semaglutide  (RYBELSUS ) 3 MG TABS, Take 1 tablet (3 mg total) by mouth daily. (Patient not taking: Reported on 05/27/2023), Disp: , Rfl:  Allergies  Allergen Reactions   Bee Venom Anaphylaxis, Swelling and Other (See Comments)    Swells all over     Social History   Socioeconomic History   Marital status: Single    Spouse name: Not on file   Number of children: Not on file   Years of education: Not on file   Highest education level: Not on file  Occupational History   Not on file  Tobacco Use   Smoking status: Former    Current packs/day: 0.00    Types: Cigarettes    Quit date: 10/06/2017    Years since quitting: 5.6   Smokeless tobacco: Never  Substance and Sexual Activity   Alcohol use: Yes    Comment: Occasional Beer   Drug use: Yes    Types: Marijuana   Sexual activity: Not on file  Other Topics Concern   Not on file  Social History Narrative   Right Handed.    Lives in a two story home    Lives with 60 year old  son.    Social Drivers of Corporate Investment Banker Strain: Low Risk  (01/18/2022)   Overall Financial Resource Strain (CARDIA)    Difficulty of Paying Living Expenses: Not hard at all  Food Insecurity: No Food Insecurity (09/21/2022)   Hunger Vital Sign    Worried About Running Out of Food in the Last Year: Never true    Ran Out of Food in the Last Year: Never true  Transportation Needs: No Transportation Needs (06/04/2022)   PRAPARE - Administrator, Civil Service (Medical): No    Lack of Transportation (Non-Medical): No  Physical Activity: Inactive (01/18/2022)   Exercise Vital Sign    Days of  Exercise per Week: 0 days    Minutes of Exercise per Session: 0 min  Stress: Stress Concern Present (01/18/2022)   [employer] of Occupational Health - Occupational Stress Questionnaire    Feeling of Stress : To some extent  Social Connections: Socially Isolated (01/18/2022)   Social Connection and Isolation Panel [NHANES]    Frequency of Communication with Friends and Family: More than three times a week    Frequency of Social Gatherings with Friends and Family: More than three times a week    Attends Religious Services: Never    Database Administrator or Organizations: No    Attends Banker Meetings: Never    Marital Status: Never married  Intimate Partner Violence: Not At Risk (01/18/2022)   Humiliation, Afraid, Rape, and Kick questionnaire    Fear of Current or Ex-Partner: No    Emotionally Abused: No    Physically Abused: No    Sexually Abused: No    Physical Exam      No future appointments.

## 2023-06-08 ENCOUNTER — Other Ambulatory Visit (HOSPITAL_COMMUNITY): Payer: Self-pay | Admitting: Emergency Medicine

## 2023-06-08 NOTE — Progress Notes (Signed)
Paramedicine Encounter    Patient ID: Thomas West, male    DOB: [birth year], 63 y.o.   MRN: [id number]   Complaints NONE  Assessment A&O x 4, skin W&D w/ good color.  Denies chest pain or SOB.  Lung souds clear and equal throughout.  No peripheral edema noted.  Compliance with meds YES  Pill box filled x 1 week  Refills needed NONE  Meds changes since last visit Started Semaglutide this visit.    Social changes NONE   BP 120/80 (BP Location: Left Arm, Patient Position: Sitting, Cuff Size: Normal)   Pulse 80   Resp 14   Wt 167 lb 6.4 oz (75.9 kg)   SpO2 97%   BMI 24.72 kg/m  Weight yesterday- not taken Last visit weight-168lb  ACTION: Home visit completed  [name] [phone] 06/08/23  Patient Care Team: [name], [name], MD as PCP - General (Family Medicine) Bensimhon, Bevelyn Buckles, MD as PCP - Cardiology (Cardiology) Glendale Chard, DO as Consulting Physician (Neurology) Sherrill Raring, [facility] (Pharmacist) Sherrill Raring, [facility] (Pharmacist)  Patient Active Problem List   Diagnosis Date Noted  . 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 Pruritic rash 05/25/2022  . Polyneuropathy associated with underlying disease (HCC) 01/15/2022  . Atherosclerosis of aorta (HCC) 01/15/2022  . History of CVA (cerebrovascular accident) without residual deficits 12/24/2021  . AKI (acute kidney injury) (HCC) 12/13/2021  . Diarrhea 12/13/2021  . Shortness of breath 12/13/2021  . Chest pain 12/13/2021  . Nausea and vomiting 12/13/2021  . Heart failure (HCC) 12/08/2021  . Hypertension associated with diabetes (HCC) 07/12/2021  . Chronic kidney disease, stage 3a (HCC) 07/12/2021  . Hyperkalemia 07/12/2021  . CHF exacerbation (HCC) 04/20/2021  . HFrEF (heart failure with reduced ejection fraction) (HCC) 02/03/2021  . Diabetes mellitus (HCC) 03/27/2019  . Hyperlipidemia associated with type 2 diabetes mellitus (HCC) 03/27/2019  . CAD (coronary artery disease)  03/05/2019  . History of MI (myocardial infarction) 03/05/2019  . Type 2 diabetes mellitus with diabetic neuropathy, unspecified (HCC) 03/05/2019  . HLD (hyperlipidemia) 03/05/2019  . GERD (gastroesophageal reflux disease) 03/05/2019  . Hypertension, essential, benign 03/05/2019    Current Outpatient Medications:  .  albuterol (VENTOLIN HFA) 108 (90 Base) MCG/ACT inhaler, Inhale 1-2 puffs into the lungs every 6 (six) hours for 1 week, then as needed., Disp: 6.7 g, Rfl: 0 .  aspirin EC 81 MG tablet, Take 1 tablet (81 mg total) by mouth daily. Swallow whole., Disp: 30 tablet, Rfl: 11 .  atorvastatin (LIPITOR) 80 MG tablet, Take 1 tablet (80 mg total) by mouth daily., Disp: 90 tablet, Rfl: 3 .  carvedilol (COREG) 6.25 MG tablet, Take 1 tablet (6.25 mg total) by mouth 2 (two) times daily., Disp: 180 tablet, Rfl: 3 .  clopidogrel (PLAVIX) 75 MG tablet, Take 1 tablet (75 mg total) by mouth daily., Disp: 90 tablet, Rfl: 0 .  DULoxetine (CYMBALTA) 30 MG capsule, Take 1 capsule (30 mg total) by mouth daily., Disp: 90 capsule, Rfl: 2 .  empagliflozin (JARDIANCE) 25 MG TABS tablet, Take 1 tablet (25 mg total) by mouth daily before breakfast., Disp: 90 tablet, Rfl: 0 .  ezetimibe (ZETIA) 10 MG tablet, Take 1 tablet (10 mg total) by mouth daily., Disp: 30 tablet, Rfl: 3 .  hydrALAZINE (APRESOLINE) 50 MG tablet, Take 2 tablets (100 mg total) by mouth 3 (three) times daily., Disp: 180 tablet, Rfl: 11 .  hydrocortisone cream 1 %, Apply 1 Application topically 2 (two) times daily  as needed for itching., Disp: 45 g, Rfl: 1 .  isosorbide mononitrate (IMDUR) 60 MG 24 hr tablet, Take 1.5 tablets (90 mg total) by mouth daily., Disp: 45 tablet, Rfl: 6 .  linagliptin (TRADJENTA) 5 MG TABS tablet, Take 1 tablet (5 mg total) by mouth daily., Disp: 30 tablet, Rfl: 3 .  sacubitril-valsartan (ENTRESTO) 97-103 MG, Take 1 tablet by mouth 2 (two) times daily., Disp: 60 tablet, Rfl: 3 .  Semaglutide (RYBELSUS) 3 MG TABS, Take  1 tablet (3 mg total) by mouth daily., Disp: 30 tablet, Rfl: 4 .  spironolactone (ALDACTONE) 25 MG tablet, Take 1 tablet (25 mg total) by mouth daily., Disp: 30 tablet, Rfl: 11 .  Tafamidis (VYNDAMAX) 61 MG CAPS, Take 1 capsule by mouth daily., Disp: 30 capsule, Rfl: 11 .  torsemide (DEMADEX) 20 MG tablet, Take 1 tablet (20 mg total) by mouth daily., Disp: 30 tablet, Rfl: 3 .  cyanocobalamin (VITAMIN B12) 1000 MCG tablet, TAKE ONE TABLET BY MOUTH DAILY (Patient not taking: Reported on 06/08/2023), Disp: 30 tablet, Rfl: 3 Allergies  Allergen Reactions  . Bee Venom Anaphylaxis, Swelling and Other (See Comments)    Swells all over     Social History   Socioeconomic History  . Marital status: Single    Spouse name: Not on file  . Number of children: Not on file  . Years of education: Not on file  . Highest education level: Not on file  Occupational History  . Not on file  Tobacco Use  . Smoking status: Former    Current packs/day: 0.00    Types: Cigarettes    Quit date: 10/06/2017    Years since quitting: 5.6  . Smokeless tobacco: Never  Substance and Sexual Activity  . Alcohol use: Yes    Comment: Occasional Beer  . Drug use: Yes    Types: Marijuana  . Sexual activity: Not on file  Other Topics Concern  . Not on file  Social History Narrative   Right Handed.    Lives in a two story home    Lives with 40 year old son.    Social Drivers of Corporate investment banker Strain: Low Risk  (01/18/2022)   Overall Financial Resource Strain (CARDIA)   . Difficulty of Paying Living Expenses: Not hard at all  Food Insecurity: No Food Insecurity (09/21/2022)   Hunger Vital Sign   . Worried About Programme researcher, broadcasting/film/video in the Last Year: Never true   . Ran Out of Food in the Last Year: Never true  Transportation Needs: No Transportation Needs (06/04/2022)   PRAPARE - Transportation   . Lack of Transportation (Medical): No   . Lack of Transportation (Non-Medical): No  Physical Activity:  Inactive (01/18/2022)   Exercise Vital Sign   . Days of Exercise per Week: 0 days   . Minutes of Exercise per Session: 0 min  Stress: Stress Concern Present (01/18/2022)   [employer] of Occupational Health - Occupational Stress Questionnaire   . Feeling of Stress : To some extent  Social Connections: Socially Isolated (01/18/2022)   Social Connection and Isolation Panel [NHANES]   . Frequency of Communication with Friends and Family: More than three times a week   . Frequency of Social Gatherings with Friends and Family: More than three times a week   . Attends Religious Services: Never   . Active Member of Clubs or Organizations: No   . Attends Banker Meetings: Never   . Marital  Status: Never married  Intimate Partner Violence: Not At Risk (01/18/2022)   Humiliation, Afraid, Rape, and Kick questionnaire   . Fear of Current or Ex-Partner: No   . Emotionally Abused: No   . Physically Abused: No   . Sexually Abused: No    Physical Exam      No future appointments.

## 2023-06-13 ENCOUNTER — Encounter (HOSPITAL_COMMUNITY): Payer: No Typology Code available for payment source

## 2023-06-15 ENCOUNTER — Telehealth (HOSPITAL_COMMUNITY): Payer: Self-pay

## 2023-06-15 ENCOUNTER — Other Ambulatory Visit (HOSPITAL_COMMUNITY): Payer: Self-pay | Admitting: Emergency Medicine

## 2023-06-15 NOTE — Telephone Encounter (Signed)
Called to confirm/remind patient of their appointment at the [facility] on 06/16/23.   Patient reminded to bring all medications and/or complete list.  Confirmed patient has transportation. Gave directions, instructed to utilize valet parking.  Confirmed appointment prior to ending call.

## 2023-06-15 NOTE — Progress Notes (Signed)
Paramedicine Encounter    Patient ID: Thomas West, male    DOB: Jan 07, 1961, 63 y.o.   MRN: [id number]   Complaints NONE  Assessment A&O x 4, skin W&D w/ good color.  Denies chest pain or SOB.  Lung sounds clear throughout and no peripheral edema noted  Compliance with meds YES  Pill box filled x 1 week  Refills needed none  Meds changes since last visit NONE    Social changes NONE   BP 110/70 (BP Location: Left Arm, Patient Position: Sitting, Cuff Size: Normal)   Pulse 93   Resp 14   Wt 167 lb 3.2 oz (75.8 kg)   SpO2 98%   BMI 24.69 kg/m  Weight yesterday- not taken Last visit weight-167lb   [name] A&O x 4, skin W&D w/ good color.  He denies chest pain or SOB.  Lung sounds clear and equal bilat with no peripheral edema noted. Med box reconciled x 1 week. Pt had an appointment scheduled 06/13/23 @ 12:00 which he missed because he thought the HF clinic  was closed for [facility] holiday.  Rescheduled him for 1/23 @ 10:00.  This visit is a follow up from recent ER visit w/ med changes. Torsemide dose changed from 20mg  M/W/F to 20mg  daily.    ACTION: Home visit completed  [name] [phone] 06/15/23  Patient Care Team: [name], [name], MD as PCP - General (Family Medicine) Bensimhon, Bevelyn Buckles, MD as PCP - Cardiology (Cardiology) Glendale Chard, DO as Consulting Physician (Neurology) Sherrill Raring, [facility] (Pharmacist) Sherrill Raring, [facility] (Pharmacist)  Patient Active Problem List   Diagnosis Date Noted   Cardiac amyloidosis [facility]) 05/25/2022   Pruritic rash 05/25/2022   Polyneuropathy associated with underlying disease (HCC) 01/15/2022   Atherosclerosis of aorta (HCC) 01/15/2022   History of CVA (cerebrovascular accident) without residual deficits 12/24/2021   AKI (acute kidney injury) (HCC) 12/13/2021   Diarrhea 12/13/2021   Shortness of breath 12/13/2021   Chest pain 12/13/2021   Nausea and vomiting 12/13/2021   Heart failure (HCC)  12/08/2021   Hypertension associated with diabetes (HCC) 07/12/2021   Chronic kidney disease, stage 3a (HCC) 07/12/2021   Hyperkalemia 07/12/2021   CHF exacerbation (HCC) 04/20/2021   HFrEF (heart failure with reduced ejection fraction) (HCC) 02/03/2021   Diabetes mellitus (HCC) 03/27/2019   Hyperlipidemia associated with type 2 diabetes mellitus (HCC) 03/27/2019   CAD (coronary artery disease) 03/05/2019   History of MI (myocardial infarction) 03/05/2019   Type 2 diabetes mellitus with diabetic neuropathy, unspecified (HCC) 03/05/2019   HLD (hyperlipidemia) 03/05/2019   GERD (gastroesophageal reflux disease) 03/05/2019   Hypertension, essential, benign 03/05/2019    Current Outpatient Medications:    albuterol (VENTOLIN HFA) 108 (90 Base) MCG/ACT inhaler, Inhale 1-2 puffs into the lungs every 6 (six) hours for 1 week, then as needed., Disp: 6.7 g, Rfl: 0   aspirin EC 81 MG tablet, Take 1 tablet (81 mg total) by mouth daily. Swallow whole., Disp: 30 tablet, Rfl: 11   atorvastatin (LIPITOR) 80 MG tablet, Take 1 tablet (80 mg total) by mouth daily., Disp: 90 tablet, Rfl: 3   carvedilol (COREG) 6.25 MG tablet, Take 1 tablet (6.25 mg total) by mouth 2 (two) times daily., Disp: 180 tablet, Rfl: 3   clopidogrel (PLAVIX) 75 MG tablet, Take 1 tablet (75 mg total) by mouth daily., Disp: 90 tablet, Rfl: 0   cyanocobalamin (VITAMIN B12) 1000 MCG tablet, TAKE ONE TABLET BY MOUTH DAILY (Patient not  taking: Reported on 06/08/2023), Disp: 30 tablet, Rfl: 3   DULoxetine (CYMBALTA) 30 MG capsule, Take 1 capsule (30 mg total) by mouth daily., Disp: 90 capsule, Rfl: 2   empagliflozin (JARDIANCE) 25 MG TABS tablet, Take 1 tablet (25 mg total) by mouth daily before breakfast., Disp: 90 tablet, Rfl: 0   ezetimibe (ZETIA) 10 MG tablet, Take 1 tablet (10 mg total) by mouth daily., Disp: 30 tablet, Rfl: 3   hydrALAZINE (APRESOLINE) 50 MG tablet, Take 2 tablets (100 mg total) by mouth 3 (three) times daily., Disp:  180 tablet, Rfl: 11   hydrocortisone cream 1 %, Apply 1 Application topically 2 (two) times daily as needed for itching., Disp: 45 g, Rfl: 1   isosorbide mononitrate (IMDUR) 60 MG 24 hr tablet, Take 1.5 tablets (90 mg total) by mouth daily., Disp: 45 tablet, Rfl: 6   linagliptin (TRADJENTA) 5 MG TABS tablet, Take 1 tablet (5 mg total) by mouth daily., Disp: 30 tablet, Rfl: 3   sacubitril-valsartan (ENTRESTO) 97-103 MG, Take 1 tablet by mouth 2 (two) times daily., Disp: 60 tablet, Rfl: 3   Semaglutide (RYBELSUS) 3 MG TABS, Take 1 tablet (3 mg total) by mouth daily., Disp: 30 tablet, Rfl: 4   spironolactone (ALDACTONE) 25 MG tablet, Take 1 tablet (25 mg total) by mouth daily., Disp: 30 tablet, Rfl: 11   Tafamidis (VYNDAMAX) 61 MG CAPS, Take 1 capsule by mouth daily., Disp: 30 capsule, Rfl: 11   torsemide (DEMADEX) 20 MG tablet, Take 1 tablet (20 mg total) by mouth daily., Disp: 30 tablet, Rfl: 3 Allergies  Allergen Reactions   Bee Venom Anaphylaxis, Swelling and Other (See Comments)    Swells all over     Social History   Socioeconomic History   Marital status: Single    Spouse name: Not on file   Number of children: Not on file   Years of education: Not on file   Highest education level: Not on file  Occupational History   Not on file  Tobacco Use   Smoking status: Former    Current packs/day: 0.00    Types: Cigarettes    Quit date: 10/06/2017    Years since quitting: 5.6   Smokeless tobacco: Never  Substance and Sexual Activity   Alcohol use: Yes    Comment: Occasional Beer   Drug use: Yes    Types: Marijuana   Sexual activity: Not on file  Other Topics Concern   Not on file  Social History Narrative   Right Handed.    Lives in a two story home    Lives with 74 year old son.    Social Drivers of Corporate investment banker Strain: Low Risk  (01/18/2022)   Overall Financial Resource Strain (CARDIA)    Difficulty of Paying Living Expenses: Not hard at all  Food  Insecurity: No Food Insecurity (09/21/2022)   Hunger Vital Sign    Worried About Running Out of Food in the Last Year: Never true    Ran Out of Food in the Last Year: Never true  Transportation Needs: No Transportation Needs (06/04/2022)   PRAPARE - Administrator, Civil Service (Medical): No    Lack of Transportation (Non-Medical): No  Physical Activity: Inactive (01/18/2022)   Exercise Vital Sign    Days of Exercise per Week: 0 days    Minutes of Exercise per Session: 0 min  Stress: Stress Concern Present (01/18/2022)   [employer] of Occupational Health - Occupational Stress  Questionnaire    Feeling of Stress : To some extent  Social Connections: Socially Isolated (01/18/2022)   Social Connection and Isolation Panel [NHANES]    Frequency of Communication with Friends and Family: More than three times a week    Frequency of Social Gatherings with Friends and Family: More than three times a week    Attends Religious Services: Never    Database administrator or Organizations: No    Attends Banker Meetings: Never    Marital Status: Never married  Intimate Partner Violence: Not At Risk (01/18/2022)   Humiliation, Afraid, Rape, and Kick questionnaire    Fear of Current or Ex-Partner: No    Emotionally Abused: No    Physically Abused: No    Sexually Abused: No    Physical Exam      No future appointments.

## 2023-06-16 ENCOUNTER — Other Ambulatory Visit (HOSPITAL_COMMUNITY): Payer: Self-pay

## 2023-06-16 ENCOUNTER — Telehealth (HOSPITAL_COMMUNITY): Payer: Self-pay | Admitting: Pharmacy Technician

## 2023-06-16 ENCOUNTER — Other Ambulatory Visit (HOSPITAL_COMMUNITY): Payer: Self-pay | Admitting: Emergency Medicine

## 2023-06-16 ENCOUNTER — Other Ambulatory Visit: Payer: Self-pay

## 2023-06-16 ENCOUNTER — Encounter (HOSPITAL_COMMUNITY): Payer: Self-pay

## 2023-06-16 ENCOUNTER — Ambulatory Visit (HOSPITAL_COMMUNITY)
Admission: RE | Admit: 2023-06-16 | Discharge: 2023-06-16 | Disposition: A | Discharge status: Home / Self Care | Payer: No Typology Code available for payment source | Source: Ambulatory Visit | Attending: Internal Medicine | Admitting: Internal Medicine

## 2023-06-16 VITALS — BP 160/90 | HR 80 | Wt 165.0 lb

## 2023-06-16 DIAGNOSIS — I252 Old myocardial infarction: Secondary | ICD-10-CM | POA: Diagnosis not present

## 2023-06-16 DIAGNOSIS — E1165 Type 2 diabetes mellitus with hyperglycemia: Secondary | ICD-10-CM | POA: Diagnosis not present

## 2023-06-16 DIAGNOSIS — I5020 Unspecified systolic (congestive) heart failure: Secondary | ICD-10-CM

## 2023-06-16 DIAGNOSIS — I5022 Chronic systolic (congestive) heart failure: Secondary | ICD-10-CM | POA: Insufficient documentation

## 2023-06-16 DIAGNOSIS — N183 Chronic kidney disease, stage 3 unspecified: Secondary | ICD-10-CM

## 2023-06-16 DIAGNOSIS — I130 Hypertensive heart and chronic kidney disease with heart failure and stage 1 through stage 4 chronic kidney disease, or unspecified chronic kidney disease: Secondary | ICD-10-CM | POA: Diagnosis not present

## 2023-06-16 DIAGNOSIS — Z9181 History of falling: Secondary | ICD-10-CM | POA: Diagnosis not present

## 2023-06-16 DIAGNOSIS — Z955 Presence of coronary angioplasty implant and graft: Secondary | ICD-10-CM | POA: Insufficient documentation

## 2023-06-16 DIAGNOSIS — I2510 Atherosclerotic heart disease of native coronary artery without angina pectoris: Secondary | ICD-10-CM | POA: Insufficient documentation

## 2023-06-16 DIAGNOSIS — E854 Organ-limited amyloidosis: Secondary | ICD-10-CM | POA: Insufficient documentation

## 2023-06-16 DIAGNOSIS — E1122 Type 2 diabetes mellitus with diabetic chronic kidney disease: Secondary | ICD-10-CM | POA: Diagnosis not present

## 2023-06-16 DIAGNOSIS — R9431 Abnormal electrocardiogram [ECG] [EKG]: Secondary | ICD-10-CM | POA: Insufficient documentation

## 2023-06-16 DIAGNOSIS — G629 Polyneuropathy, unspecified: Secondary | ICD-10-CM | POA: Diagnosis not present

## 2023-06-16 DIAGNOSIS — I10 Essential (primary) hypertension: Secondary | ICD-10-CM

## 2023-06-16 DIAGNOSIS — Z8673 Personal history of transient ischemic attack (TIA), and cerebral infarction without residual deficits: Secondary | ICD-10-CM | POA: Insufficient documentation

## 2023-06-16 DIAGNOSIS — I43 Cardiomyopathy in diseases classified elsewhere: Secondary | ICD-10-CM | POA: Insufficient documentation

## 2023-06-16 DIAGNOSIS — N1832 Chronic kidney disease, stage 3b: Secondary | ICD-10-CM | POA: Insufficient documentation

## 2023-06-16 DIAGNOSIS — Z5982 Transportation insecurity: Secondary | ICD-10-CM | POA: Diagnosis not present

## 2023-06-16 DIAGNOSIS — Z139 Encounter for screening, unspecified: Secondary | ICD-10-CM

## 2023-06-16 DIAGNOSIS — E11610 Type 2 diabetes mellitus with diabetic neuropathic arthropathy: Secondary | ICD-10-CM | POA: Diagnosis not present

## 2023-06-16 LAB — COMPREHENSIVE METABOLIC PANEL
ALT: 12 U/L (ref 0–44)
AST: 16 U/L (ref 15–41)
Albumin: 3.6 g/dL (ref 3.5–5.0)
Alkaline Phosphatase: 70 U/L (ref 38–126)
Anion gap: 10 (ref 5–15)
BUN: 33 mg/dL — ABNORMAL HIGH (ref 6–20)
CO2: 23 mmol/L (ref 22–32)
Calcium: 8.9 mg/dL (ref 8.9–10.3)
Chloride: 103 mmol/L (ref 98–111)
Creatinine, Ser: 2.18 mg/dL — ABNORMAL HIGH (ref 0.61–1.24)
GFR, Estimated: 34 mL/min — ABNORMAL LOW (ref >60)
Glucose, Bld: 108 mg/dL — ABNORMAL HIGH (ref 70–99)
Potassium: 5.2 mmol/L — ABNORMAL HIGH (ref 3.5–5.1)
Sodium: 136 mmol/L (ref 135–145)
Total Bilirubin: 0.7 mg/dL (ref 0.0–1.2)
Total Protein: 8 g/dL (ref 6.5–8.1)

## 2023-06-16 LAB — BRAIN NATRIURETIC PEPTIDE: B Natriuretic Peptide: 37.1 pg/mL (ref 0.0–100.0)

## 2023-06-16 MED ORDER — CARVEDILOL 12.5 MG PO TABS
12.5000 mg | ORAL_TABLET | Freq: Two times a day (BID) | ORAL | 3 refills | Status: AC
  Filled 2023-06-16: qty 180, 90d supply, fill #0
  Filled 2023-09-07: qty 180, 90d supply, fill #1
  Filled 2023-12-06: qty 180, 90d supply, fill #2
  Filled 2024-03-05: qty 180, 90d supply, fill #3

## 2023-06-16 NOTE — Progress Notes (Signed)
Visit for med change only.  Carvedilol dose increased to 12.5mg  BID.  Held [place] and Cleda Daub x 1 day and subsequent doses of Cleda Daub changed to 12.5mg . daily.  Med box reconciled to reflect same.    Beatrix Shipper, EMT-Paramedic [phone] 06/16/2023

## 2023-06-16 NOTE — Telephone Encounter (Signed)
Advanced Heart Failure Patient Advocate Encounter  The patient was approved for a [employer] grant that will help cover the cost of Vyndamax. Total amount awarded, $10,000. Eligibility, 05/17/23 - 05/15/24.  ID [id number]  BIN [id number]  PCN [id number]  Group [id number]  Billing information added to wam.  Archer Asa, CPhT

## 2023-06-16 NOTE — Patient Instructions (Addendum)
Increase Coreg to 12.5 mg twice daily. Updated Rx sent to local pharmacy. Labs today - will call you if abnormal. Return to see Dr. Gala Romney in [facility] in 3 - 4 months. Please call [phone] in March to schedule this appointment. Please call us at [phone] if any questions or concerns prior to your next visit.

## 2023-06-16 NOTE — Progress Notes (Signed)
Paramedicine Encounter   Patient ID: Thomas West , male,   DOB: July 29, 1960,62 y.o.,  MRN: [id number]   Met patient in clinic today with provider.   [id number]@ clinic-165.8lb B/P-170/100 P-80 [id number]   Med changes today (if any) : Increase Carvedilol to 12.5mg . BID  Will have blood work done for other possible changes if any.   Beatrix Shipper, EMT-Paramedic [phone] 06/16/2023

## 2023-06-16 NOTE — Progress Notes (Signed)
Specialty Pharmacy Refill Coordination Note  Thomas West is a 63 y.o. male contacted today regarding refills of specialty medication(s) Tafamidis [name]   Delivery date: 06/22/23   Verified address: [address], [place] [address]   Medication will be filled on 06/21/23.

## 2023-06-16 NOTE — Progress Notes (Signed)
Advanced Heart Failure Clinic Note   Primary Care: Dr. Betty [name] HF Cardiologist: Dr. Gala Romney  HPI: [name] is a 63 y.o. male with history of CAD with prior MI and stent in 01/2019 at [facility] in [place] (report not available), cardiomyopathy/chronic systolic HF, uncontrolled DM II, HTN, hx CVA on chart review, CKD.    Moved to [place] from [place], [place] 2 years ago.   Had not medical f/u until he was admitted in 10/22 with a/c systolic HF. Had been out of cardiac medications. Diuresed with IV lasix then transitioned to po lasix 40 mg daily. Started on GDMT with carvedilol, hydralazine, imdur and empagliflozin.    Echo 10/22 with EF 20-25%, RV okay, trivial MR   [facility] 10/22 with nonobstructive CAD and patent RPLV stent. RA mean 5 mmHg, PCWP mean 5 mmHg, Fick CO 7.46/CI 3.85.   He did not show up for [facility] appointment after discharge.   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  Admitted 12/22 with a/c CHF due to noncompliance with GDMT. cMRI strongly suggestive of amyloidosis, GDMT titrated. Admitted 2/23 with a/c CHF after running out of meds x 1 week. Given IV lasix, GDMT restarted. Imdur held with low BP. Seen in ED 3/23 with back pain, felt to be related to MSK. Seen in ED 08/18/21 with SOB, cardiac work up unrevealing.  Admitted 12/07/21-12/10/21 post fall w/ a/c CHF. Diuresed well with IV lasix/metolazone and discharged. He developed an AKI Cr >> 1.2>>1.75, Entresto and spiro were held and he was instructed to restart the next day following discharge. He was seen by paramedicine the following day and he was in no acute distress, taking all meds. Denied CP, SOB and overall feeling well.   Readmitted 12/12/21 w/ 2 days of persistent N/V/D with intermittent CP and SOB. Presented with AKI cr 3.3 2/2 emesis/diarrhea. Responded well to 500 mL NS bolus and  holding of Entresto and spiro. Restarted meds once discharged.   Admitted 8/23 with a/c CHF. Diuresed with IV lasix. Hospitalization c/b with AKI, GDMT initially held; Entresto resumed at low dose, torsemide on MWF. Discharged home, weight 145 lbs.  Echo 02/02/23 EF 45%   Seen in ED earlier this month with CHF exacerbation. Torsemide increased to daily.   Today he returns for AHF follow up with [name] from paramedicine. Overall feeling good. Denies palpitations, CP, dizziness, edema, or PND/Orthopnea. No SOB. Appetite ok. No fever or chills. Weight at home 167 pounds. Taking all medications. Denies tobacco use, smoked marijuana several times a week. Drinks occasionally on the holidays, beer or wine. He is followed by paramedicine. Plays with his 8 nephews at home, they keep him active.   EKG with NSR 79 bpm (Personally reviewed)    Cardiac Studies: - Echo 02/02/23 EF 45%  - Echo (6/23): EF 30-35%, LV global hypokinesis, Grade 1 DD, RV function normal - PYP (12/22): suggestive to TTR amyloid and may benefit from addition of tafamadis as outpatient.  - cMRI (11/22): LVEF 24% RVEF 21% LGE and ECW suggestive of cardiac amyloidosis  ROS: All systems reviewed and negative except as per HPI.   Past Medical History:  Diagnosis Date   CHF (congestive heart failure) (HCC)    Coronary artery disease    Diabetes mellitus without complication (HCC)    History of MI (myocardial infarction) 03/05/2019   Hypertension    Stroke (  HCC)    Current Outpatient Medications  Medication Sig Dispense Refill   albuterol (VENTOLIN HFA) 108 (90 Base) MCG/ACT inhaler Inhale 1-2 puffs into the lungs every 6 (six) hours for 1 week, then as needed. 6.7 g 0   aspirin EC 81 MG tablet Take 1 tablet (81 mg total) by mouth daily. Swallow whole. 30 tablet 11   atorvastatin (LIPITOR) 80 MG tablet Take 1 tablet (80 mg total) by mouth daily. 90 tablet 3   carvedilol (COREG) 6.25 MG tablet Take 1 tablet (6.25 mg total) by mouth  2 (two) times daily. 180 tablet 3   clopidogrel (PLAVIX) 75 MG tablet Take 1 tablet (75 mg total) by mouth daily. 90 tablet 0   cyanocobalamin (VITAMIN B12) 1000 MCG tablet TAKE ONE TABLET BY MOUTH DAILY 30 tablet 3   DULoxetine (CYMBALTA) 30 MG capsule Take 1 capsule (30 mg total) by mouth daily. 90 capsule 2   empagliflozin (JARDIANCE) 25 MG TABS tablet Take 1 tablet (25 mg total) by mouth daily before breakfast. 90 tablet 0   ezetimibe (ZETIA) 10 MG tablet Take 1 tablet (10 mg total) by mouth daily. 30 tablet 3   hydrALAZINE (APRESOLINE) 50 MG tablet Take 2 tablets (100 mg total) by mouth 3 (three) times daily. 180 tablet 11   hydrocortisone cream 1 % Apply 1 Application topically 2 (two) times daily as needed for itching. 45 g 1   isosorbide mononitrate (IMDUR) 60 MG 24 hr tablet Take 1.5 tablets (90 mg total) by mouth daily. 45 tablet 6   linagliptin (TRADJENTA) 5 MG TABS tablet Take 1 tablet (5 mg total) by mouth daily. 30 tablet 3   sacubitril-valsartan (ENTRESTO) 97-103 MG Take 1 tablet by mouth 2 (two) times daily. 60 tablet 3   Semaglutide (RYBELSUS) 3 MG TABS Take 1 tablet (3 mg total) by mouth daily. 30 tablet 4   spironolactone (ALDACTONE) 25 MG tablet Take 1 tablet (25 mg total) by mouth daily. 30 tablet 11   Tafamidis (VYNDAMAX) 61 MG CAPS Take 1 capsule by mouth daily. 30 capsule 11   torsemide (DEMADEX) 20 MG tablet Take 1 tablet (20 mg total) by mouth daily. 30 tablet 3   No current facility-administered medications for this encounter.   Allergies  Allergen Reactions   Bee Venom Anaphylaxis, Swelling and Other (See Comments)    Swells all over   Social History   Socioeconomic History   Marital status: Single    Spouse name: Not on file   Number of children: Not on file   Years of education: Not on file   Highest education level: Not on file  Occupational History   Not on file  Tobacco Use   Smoking status: Former    Current packs/day: 0.00    Types: Cigarettes     Quit date: 10/06/2017    Years since quitting: 5.6   Smokeless tobacco: Never  Substance and Sexual Activity   Alcohol use: Yes    Comment: Occasional Beer   Drug use: Yes    Types: Marijuana   Sexual activity: Not on file  Other Topics Concern   Not on file  Social History Narrative   Right Handed.    Lives in a two story home    Lives with 63 year old son.    Social Drivers of Corporate investment banker Strain: Low Risk  (01/18/2022)   Overall Financial Resource Strain (CARDIA)    Difficulty of Paying Living Expenses: Not hard at  all  Food Insecurity: No Food Insecurity (09/21/2022)   Hunger Vital Sign    Worried About Running Out of Food in the Last Year: Never true    Ran Out of Food in the Last Year: Never true  Transportation Needs: No Transportation Needs (06/04/2022)   PRAPARE - Administrator, Civil Service (Medical): No    Lack of Transportation (Non-Medical): No  Physical Activity: Inactive (01/18/2022)   Exercise Vital Sign    Days of Exercise per Week: 0 days    Minutes of Exercise per Session: 0 min  Stress: Stress Concern Present (01/18/2022)   [employer] of Occupational Health - Occupational Stress Questionnaire    Feeling of Stress : To some extent  Social Connections: Socially Isolated (01/18/2022)   Social Connection and Isolation Panel [NHANES]    Frequency of Communication with Friends and Family: More than three times a week    Frequency of Social Gatherings with Friends and Family: More than three times a week    Attends Religious Services: Never    Database administrator or Organizations: No    Attends Banker Meetings: Never    Marital Status: Never married  Intimate Partner Violence: Not At Risk (01/18/2022)   Humiliation, Afraid, Rape, and Kick questionnaire    Fear of Current or Ex-Partner: No    Emotionally Abused: No    Physically Abused: No    Sexually Abused: No   Family History  Problem Relation Age of Onset    Diabetes Mother    Kidney failure Mother    BP (!) 160/90   Pulse 80   Wt 74.8 kg (165 lb)   SpO2 98%   BMI 24.37 kg/m   Wt Readings from Last 3 Encounters:  06/16/23 75.2 kg (165 lb 12.8 oz)  06/16/23 74.8 kg (165 lb)  06/15/23 75.8 kg (167 lb 3.2 oz)   PHYSICAL EXAM: General:  well appearing.  No respiratory difficulty. Arrived in [facility] HEENT: normal Neck: supple. JVD ~6. Carotids 2+ bilat; no bruits. No lymphadenopathy or thyromegaly appreciated. Cor: PMI nondisplaced. Regular rate & rhythm. No rubs, gallops or murmurs. Lungs: diminished Abdomen: soft, nontender, nondistended. No hepatosplenomegaly. No bruits or masses. Good bowel sounds. Extremities: no cyanosis, clubbing, rash, trace BLE edema  Neuro: alert & oriented x 3, cranial nerves grossly intact. moves all 4 extremities w/o difficulty. Affect pleasant.   ASSESSMENT & PLAN: Chronic Systolic HF/Cardiac amyloidosis - Onset not certain. He reports cardiomyopathy diagnosed just prior to MI while living in [place] a couple of years ago. - Echo (10/22): EF 20-25%, RV ok - R/LHC (10/22): Patent RPLV stent with nonobstructive disease elsewhere, Preserved CO/CI, RA mean 5 mmHg, PCWP mean 5 mmHg - cMRI (11/22): LVEF 24% RVEF 21% LGE and ECW suggestive of cardiac amyloidosis. 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 - Multiple myeloma panel (12/22) with no m-spike. 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 Volume stable - Continue torsemide 20 mg daily. BMET/BNP today - Continue hydralazine 100 mg tid and continue Imdur 90 mg daily.  - Continue Entresto 97/103 mg bid. - Continue Jardiance 25 mg daily. No GU symptoms. - Increase carvedilol 6.25>12.5 mg bid.  - Continue spironolactone 25 mg daily.   - Continue Tafamadis.  - Consider addition of silencer when approved for  non-familial  2. wtTTR cardiac amyloidosis - Continue tafamadis. - Has seen Dr. Tyson Alias  - Consider addition of silencer when approved for non-familial  3. CAD - Prior MI followed by PCI in [place] in either 2019 or 2020.  - Patent RPLV stent on [facility] 10/22. Also had 20% distal RCA, 30% p LAD, 70% d LAD, 60% OM2 - Stent placed 2020. >63yr since stent placement, could consider stopping Plavix d/t issues with noncompliance. - No s/s angina  - Continue ASA.  - Continue atorvastatin 80 mg daily.   4. HTN - Blood pressure elevated, rechecked at end of visit and it remained elevated.  - Continue Imdur as above - Increase carvedilol as above.   5. Uncontrolled DM - A1c down to 7.3 from 12. - Continue Jardiance.  6. Neuropathy - Unclear if due to DM2 or TTR - Saw Dr. Allena Katz in Neurology as above, with wild type TTR, current therapies not approved. - Continue supportive care.  7. CKD stage IIIb - Baseline SCr 1.7-2.0 - Continue Jardiance - BMET today.    8. Hx fall - CT: L-spine on 12/08/2021 was negative for acute fracture or subluxation of the L-spine. - No further falls. Neurology has arranged balance PT.  9. SDOH - He has Medicaid and disability. - Transportation is an issue, he has a car but unable to drive (needs license). - 61 year old son helps with meds. - Paramedicine follows, appreciated their assistance.  Follow up in 3-4 months with Dr. Glenice Laine, NP  10:18 AM

## 2023-06-16 NOTE — Progress Notes (Signed)
ReDS Vest / Clip - 06/16/23 1000       ReDS Vest / Clip   Station Marker C    Ruler Value 30    ReDS Value Range Low volume    ReDS Actual Value 30

## 2023-06-18 ENCOUNTER — Other Ambulatory Visit: Payer: Self-pay | Admitting: Family Medicine

## 2023-06-18 DIAGNOSIS — R062 Wheezing: Secondary | ICD-10-CM

## 2023-06-20 ENCOUNTER — Other Ambulatory Visit (HOSPITAL_COMMUNITY): Payer: Self-pay

## 2023-06-20 MED ORDER — ALBUTEROL SULFATE HFA 108 (90 BASE) MCG/ACT IN AERS
1.0000 | INHALATION_SPRAY | Freq: Four times a day (QID) | RESPIRATORY_TRACT | 0 refills | Status: AC
  Filled 2024-04-16: qty 6.7, 25d supply, fill #0

## 2023-06-21 ENCOUNTER — Other Ambulatory Visit: Payer: Self-pay

## 2023-06-22 ENCOUNTER — Telehealth: Payer: Self-pay

## 2023-06-22 ENCOUNTER — Other Ambulatory Visit: Payer: Self-pay

## 2023-06-22 ENCOUNTER — Telehealth (HOSPITAL_COMMUNITY): Payer: Self-pay | Admitting: Cardiology

## 2023-06-22 ENCOUNTER — Other Ambulatory Visit (HOSPITAL_COMMUNITY): Payer: Self-pay | Admitting: Emergency Medicine

## 2023-06-22 ENCOUNTER — Other Ambulatory Visit (HOSPITAL_COMMUNITY): Payer: Self-pay

## 2023-06-22 MED ORDER — TORSEMIDE 20 MG PO TABS
20.0000 mg | ORAL_TABLET | ORAL | 3 refills | Status: DC
  Filled 2023-06-22: qty 45, 90d supply, fill #0
  Filled 2023-11-15: qty 45, 90d supply, fill #1

## 2023-06-22 MED ORDER — VITAMIN B-12 1000 MCG PO TABS
1000.0000 ug | ORAL_TABLET | Freq: Every day | ORAL | 3 refills | Status: AC
  Filled 2023-06-22 (×2): qty 130, 130d supply, fill #0

## 2023-06-22 MED ORDER — SPIRONOLACTONE 25 MG PO TABS
12.5000 mg | ORAL_TABLET | Freq: Every day | ORAL | 3 refills | Status: AC
  Filled 2023-06-22: qty 45, 90d supply, fill #0
  Filled 2023-09-13: qty 45, 90d supply, fill #1
  Filled 2023-12-12: qty 45, 90d supply, fill #2
  Filled 2024-03-11: qty 45, 90d supply, fill #3

## 2023-06-22 NOTE — Progress Notes (Signed)
Paramedicine Encounter    Patient ID: Thomas West, male    DOB: 30-Jul-1960, 63 y.o.   MRN: [id number]   Complaints NONE  Assessment A&O x 4, skin W&D w/ good color.  Denies chest pain or SOB.  Lung sounds w/ mild expiratory wheezing.  No peripheral edema noted.  Compliance with meds YES  Pill box filled x 1 week  Refills needed NONE  Meds changes since last visit increase Carvedilol  12.5mg . BI Decrease Spiro to 12.5mg  daily and decrease Torsemide M/W/F  Social changes no change   BP (!) 150/80 (BP Location: Left Arm, Patient Position: Sitting, Cuff Size: Normal)   Pulse 80   Wt 168 lb 14.4 oz (76.6 kg)   SpO2 98%   BMI 24.94 kg/m  Weight yesterday-not taken Last visit weight-167lb  Reminded pt of lab appointment tomorrow @ [facility]. Pt's BP was elevated this morning but he had not taken his morning meds at time of visit.  Pt. Denies chest pain or SOB.  Lung sounds clear bilat and no edema noted.  ACTION: Home visit completed  [name] [phone] 06/22/23  Patient Care Team: [name], [name], MD as PCP - General (Family Medicine) Bensimhon, Bevelyn Buckles, MD as PCP - Cardiology (Cardiology) Glendale Chard, DO as Consulting Physician (Neurology) Sherrill Raring, [facility] (Pharmacist) Sherrill Raring, [facility] (Pharmacist)  Patient Active Problem List   Diagnosis Date Noted   Cardiac amyloidosis [facility]) 05/25/2022   Pruritic rash 05/25/2022   Polyneuropathy associated with underlying disease (HCC) 01/15/2022   Atherosclerosis of aorta (HCC) 01/15/2022   History of CVA (cerebrovascular accident) without residual deficits 12/24/2021   AKI (acute kidney injury) (HCC) 12/13/2021   Diarrhea 12/13/2021   Shortness of breath 12/13/2021   Chest pain 12/13/2021   Nausea and vomiting 12/13/2021   Heart failure (HCC) 12/08/2021   Hypertension associated with diabetes (HCC) 07/12/2021   Chronic kidney disease, stage 3a (HCC) 07/12/2021   Hyperkalemia 07/12/2021    CHF exacerbation (HCC) 04/20/2021   HFrEF (heart failure with reduced ejection fraction) (HCC) 02/03/2021   Diabetes mellitus (HCC) 03/27/2019   Hyperlipidemia associated with type 2 diabetes mellitus (HCC) 03/27/2019   CAD (coronary artery disease) 03/05/2019   History of MI (myocardial infarction) 03/05/2019   Type 2 diabetes mellitus with diabetic neuropathy, unspecified (HCC) 03/05/2019   HLD (hyperlipidemia) 03/05/2019   GERD (gastroesophageal reflux disease) 03/05/2019   Hypertension, essential, benign 03/05/2019    Current Outpatient Medications:    albuterol (VENTOLIN HFA) 108 (90 Base) MCG/ACT inhaler, Inhale 1-2 puffs into the lungs every 6 (six) hours for 1 week, then as needed., Disp: 6.7 g, Rfl: 0   aspirin EC 81 MG tablet, Take 1 tablet (81 mg total) by mouth daily. Swallow whole., Disp: 30 tablet, Rfl: 11   atorvastatin (LIPITOR) 80 MG tablet, Take 1 tablet (80 mg total) by mouth daily., Disp: 90 tablet, Rfl: 3   carvedilol (COREG) 12.5 MG tablet, Take 1 tablet (12.5 mg total) by mouth 2 (two) times daily., Disp: 180 tablet, Rfl: 3   clopidogrel (PLAVIX) 75 MG tablet, Take 1 tablet (75 mg total) by mouth daily., Disp: 90 tablet, Rfl: 0   DULoxetine (CYMBALTA) 30 MG capsule, Take 1 capsule (30 mg total) by mouth daily., Disp: 90 capsule, Rfl: 2   empagliflozin (JARDIANCE) 25 MG TABS tablet, Take 1 tablet (25 mg total) by mouth daily before breakfast., Disp: 90 tablet, Rfl: 0   ezetimibe (ZETIA) 10 MG tablet, Take 1 tablet (10  mg total) by mouth daily., Disp: 30 tablet, Rfl: 3   hydrALAZINE (APRESOLINE) 50 MG tablet, Take 2 tablets (100 mg total) by mouth 3 (three) times daily., Disp: 180 tablet, Rfl: 11   hydrocortisone cream 1 %, Apply 1 Application topically 2 (two) times daily as needed for itching., Disp: 45 g, Rfl: 1   isosorbide mononitrate (IMDUR) 60 MG 24 hr tablet, Take 1.5 tablets (90 mg total) by mouth daily., Disp: 45 tablet, Rfl: 6   linagliptin (TRADJENTA) 5 MG  TABS tablet, Take 1 tablet (5 mg total) by mouth daily., Disp: 30 tablet, Rfl: 3   sacubitril-valsartan (ENTRESTO) 97-103 MG, Take 1 tablet by mouth 2 (two) times daily., Disp: 60 tablet, Rfl: 3   Semaglutide (RYBELSUS) 3 MG TABS, Take 1 tablet (3 mg total) by mouth daily., Disp: 30 tablet, Rfl: 4   spironolactone (ALDACTONE) 25 MG tablet, Take 1 tablet (25 mg total) by mouth daily., Disp: 30 tablet, Rfl: 11   Tafamidis (VYNDAMAX) 61 MG CAPS, Take 1 capsule by mouth daily., Disp: 30 capsule, Rfl: 11   torsemide (DEMADEX) 20 MG tablet, Take 1 tablet (20 mg total) by mouth daily., Disp: 30 tablet, Rfl: 3   cyanocobalamin (VITAMIN B12) 1000 MCG tablet, Take 1 tablet (1,000 mcg total) by mouth daily. (Patient not taking: Reported on 06/22/2023), Disp: 30 tablet, Rfl: 3 Allergies  Allergen Reactions   Bee Venom Anaphylaxis, Swelling and Other (See Comments)    Swells all over     Social History   Socioeconomic History   Marital status: Single    Spouse name: Not on file   Number of children: Not on file   Years of education: Not on file   Highest education level: Not on file  Occupational History   Not on file  Tobacco Use   Smoking status: Former    Current packs/day: 0.00    Types: Cigarettes    Quit date: 10/06/2017    Years since quitting: 5.7   Smokeless tobacco: Never  Substance and Sexual Activity   Alcohol use: Yes    Comment: Occasional Beer   Drug use: Yes    Types: Marijuana   Sexual activity: Not on file  Other Topics Concern   Not on file  Social History Narrative   Right Handed.    Lives in a two story home    Lives with 22 year old son.    Social Drivers of Corporate investment banker Strain: Low Risk  (01/18/2022)   Overall Financial Resource Strain (CARDIA)    Difficulty of Paying Living Expenses: Not hard at all  Food Insecurity: No Food Insecurity (09/21/2022)   Hunger Vital Sign    Worried About Running Out of Food in the Last Year: Never true    Ran Out  of Food in the Last Year: Never true  Transportation Needs: No Transportation Needs (06/04/2022)   PRAPARE - Administrator, Civil Service (Medical): No    Lack of Transportation (Non-Medical): No  Physical Activity: Inactive (01/18/2022)   Exercise Vital Sign    Days of Exercise per Week: 0 days    Minutes of Exercise per Session: 0 min  Stress: Stress Concern Present (01/18/2022)   [employer] of Occupational Health - Occupational Stress Questionnaire    Feeling of Stress : To some extent  Social Connections: Socially Isolated (01/18/2022)   Social Connection and Isolation Panel [NHANES]    Frequency of Communication with Friends and Family: More than  three times a week    Frequency of Social Gatherings with Friends and Family: More than three times a week    Attends Religious Services: Never    Database administrator or Organizations: No    Attends Banker Meetings: Never    Marital Status: Never married  Intimate Partner Violence: Not At Risk (01/18/2022)   Humiliation, Afraid, Rape, and Kick questionnaire    Fear of Current or Ex-Partner: No    Emotionally Abused: No    Physically Abused: No    Sexually Abused: No    Physical Exam      Future Appointments  Date Time Provider Department Center  06/23/2023 11:30 AM [facility] LAB [facility] None

## 2023-06-22 NOTE — Telephone Encounter (Signed)
-----   Message from CMA [name] S sent at 06/16/2023 10:16 AM EST ----- Mr.  Baumler is in need of refill on his B12 please.  [employer].  Vernia Buff, EMT-Paramedic [phone] 06/16/2023

## 2023-06-22 NOTE — Telephone Encounter (Signed)
-----   Message from CMA [name] S sent at 06/22/2023  8:54 AM EST ----- Regarding: med change Per [place] on 1/23 pt's Spironolactone should be 25mg  1/2 tablet daily and Torsemide 20mg . M/W/F if you could change that in his med list.  Vernia Buff, EMT-Paramedic [phone] 06/22/2023

## 2023-06-22 NOTE — Telephone Encounter (Signed)
   Per 1/23 labs results  Fluid marker low, decrease Torsemide to 20 mg every other day. Can take extra as needed for swelling/SOB.   K high, hold spironolactone and entresto for 1 day. Restart entresto at same dose but cut back spiro to 12.5 mg daily.   Repeat BMET 1 week. Aniceto Boss, NP  Meds updated to reflect changes

## 2023-06-22 NOTE — Progress Notes (Signed)
Created in error    Beatrix Shipper, EMT-Paramedic [phone] 06/22/2023

## 2023-06-23 ENCOUNTER — Other Ambulatory Visit (HOSPITAL_COMMUNITY): Payer: Self-pay

## 2023-06-23 ENCOUNTER — Ambulatory Visit (HOSPITAL_COMMUNITY)
Admission: RE | Admit: 2023-06-23 | Discharge: 2023-06-23 | Disposition: A | Discharge status: Home / Self Care | Payer: No Typology Code available for payment source | Source: Ambulatory Visit | Attending: Cardiology | Admitting: Cardiology

## 2023-06-23 ENCOUNTER — Telehealth (HOSPITAL_COMMUNITY): Payer: Self-pay | Admitting: Emergency Medicine

## 2023-06-23 DIAGNOSIS — I5020 Unspecified systolic (congestive) heart failure: Secondary | ICD-10-CM | POA: Insufficient documentation

## 2023-06-23 LAB — BASIC METABOLIC PANEL
Anion gap: 8 (ref 5–15)
BUN: 19 mg/dL (ref 6–20)
CO2: 26 mmol/L (ref 22–32)
Calcium: 8.6 mg/dL — ABNORMAL LOW (ref 8.9–10.3)
Chloride: 106 mmol/L (ref 98–111)
Creatinine, Ser: 1.76 mg/dL — ABNORMAL HIGH (ref 0.61–1.24)
GFR, Estimated: 44 mL/min — ABNORMAL LOW (ref >60)
Glucose, Bld: 104 mg/dL — ABNORMAL HIGH (ref 70–99)
Potassium: 4.2 mmol/L (ref 3.5–5.1)
Sodium: 140 mmol/L (ref 135–145)

## 2023-06-23 NOTE — Telephone Encounter (Signed)
Text Mr. Binsfeld to let him know I followed up on today's labs and that they were reported stable and no med changes at this time. He responded he received message.   Beatrix Shipper, EMT-Paramedic [phone] 06/23/2023

## 2023-06-25 ENCOUNTER — Other Ambulatory Visit: Payer: Self-pay | Admitting: Family Medicine

## 2023-06-25 DIAGNOSIS — I2510 Atherosclerotic heart disease of native coronary artery without angina pectoris: Secondary | ICD-10-CM

## 2023-06-27 ENCOUNTER — Other Ambulatory Visit (HOSPITAL_COMMUNITY): Payer: Self-pay

## 2023-06-27 ENCOUNTER — Other Ambulatory Visit (HOSPITAL_BASED_OUTPATIENT_CLINIC_OR_DEPARTMENT_OTHER): Payer: Self-pay

## 2023-06-27 MED ORDER — CLOPIDOGREL BISULFATE 75 MG PO TABS
75.0000 mg | ORAL_TABLET | Freq: Every day | ORAL | 2 refills | Status: DC
  Filled 2023-06-27: qty 90, 90d supply, fill #0
  Filled 2023-09-19: qty 90, 90d supply, fill #1
  Filled 2023-12-19: qty 90, 90d supply, fill #2

## 2023-06-28 ENCOUNTER — Other Ambulatory Visit (HOSPITAL_COMMUNITY): Payer: Self-pay

## 2023-06-28 ENCOUNTER — Other Ambulatory Visit: Payer: Self-pay

## 2023-06-29 ENCOUNTER — Other Ambulatory Visit (HOSPITAL_COMMUNITY): Payer: Self-pay | Admitting: Emergency Medicine

## 2023-06-29 NOTE — Progress Notes (Signed)
 Paramedicine Encounter    Patient ID: Thomas West, male    DOB: [birth year], 63 y.o.   MRN: [id number]   Complaints NONE  Assessment A&O x 4, skin W&D w/ good color.  Denies chest pain or SOB.  Lung sounds clear bilat and no peripheral edema noted.  Compliance with meds YES  Pill box filled x 1 week  Refills needed NONE  Meds changes since last visit NONE   Social changes NONE   BP 130/80 (BP Location: Left Arm, Patient Position: Sitting, Cuff Size: Normal)   Pulse 80   Resp 16   Wt 169 lb 3.2 oz (76.7 kg)   SpO2 97%   BMI 24.99 kg/m  Weight yesterday-not taken Last visit weight- need new batteries for scale reading inconsistent.  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   Pruritic rash 05/25/2022   Polyneuropathy associated with underlying disease (HCC) 01/15/2022   Atherosclerosis of aorta (HCC) 01/15/2022   History of CVA (cerebrovascular accident) without residual deficits 12/24/2021   AKI (acute kidney injury) (HCC) 12/13/2021   Diarrhea 12/13/2021   Shortness of breath 12/13/2021   Chest pain 12/13/2021   Nausea and vomiting 12/13/2021   Heart failure (HCC) 12/08/2021   Hypertension associated with diabetes (HCC) 07/12/2021   Chronic kidney disease, stage 3a (HCC) 07/12/2021   Hyperkalemia 07/12/2021   CHF exacerbation (HCC) 04/20/2021   HFrEF (heart failure with reduced ejection fraction) (HCC) 02/03/2021   Diabetes mellitus (HCC) 03/27/2019   Hyperlipidemia associated with type 2 diabetes mellitus (HCC) 03/27/2019   CAD (coronary artery disease) 03/05/2019    History of MI (myocardial infarction) 03/05/2019   Type 2 diabetes mellitus with diabetic neuropathy, unspecified (HCC) 03/05/2019   HLD (hyperlipidemia) 03/05/2019   GERD (gastroesophageal reflux disease) 03/05/2019   Hypertension, essential, benign 03/05/2019    Current Outpatient Medications:    albuterol  (VENTOLIN  HFA) 108 (90 Base) MCG/ACT inhaler, Inhale 1-2 puffs into the lungs every 6 (six) hours for 1 week, then as needed., Disp: 6.7 g, Rfl: 0   aspirin  EC 81 MG tablet, Take 1 tablet (81 mg total) by mouth daily. Swallow whole., Disp: 30 tablet, Rfl: 11   atorvastatin  (LIPITOR ) 80 MG tablet, Take 1 tablet (80 mg total) by mouth daily., Disp: 90 tablet, Rfl: 3   carvedilol  (COREG ) 12.5 MG tablet, Take 1 tablet (12.5 mg total) by mouth 2 (two) times daily., Disp: 180 tablet, Rfl: 3   clopidogrel  (PLAVIX ) 75 MG tablet, Take 1 tablet (75 mg total) by mouth daily., Disp: 90 tablet, Rfl: 2   cyanocobalamin  (VITAMIN B12) 1000 MCG tablet, Take 1 tablet (1,000 mcg total) by mouth daily., Disp: 130 tablet, Rfl: 3   DULoxetine  (CYMBALTA ) 30 MG capsule, Take 1 capsule (30 mg total) by mouth daily., Disp: 90 capsule, Rfl: 2   empagliflozin  (JARDIANCE ) 25 MG TABS tablet, Take 1 tablet (25 mg total) by mouth daily before breakfast., Disp: 90 tablet, Rfl: 0   ezetimibe  (ZETIA ) 10 MG tablet, Take 1 tablet (10 mg total) by mouth daily., Disp: 30 tablet, Rfl: 3   hydrALAZINE  (APRESOLINE ) 50 MG tablet, Take 2 tablets (100 mg total) by mouth 3 (  three) times daily., Disp: 180 tablet, Rfl: 11   hydrocortisone  cream 1 %, Apply 1 Application topically 2 (two) times daily as needed for itching., Disp: 45 g, Rfl: 1   isosorbide  mononitrate (IMDUR ) 60 MG 24 hr tablet, Take 1.5 tablets (90 mg total) by mouth daily., Disp: 45 tablet, Rfl: 6   linagliptin  (TRADJENTA ) 5 MG TABS tablet, Take 1 tablet (5 mg total) by mouth daily., Disp: 30 tablet, Rfl: 3   sacubitril -valsartan  (ENTRESTO ) 97-103 MG, Take 1 tablet by  mouth 2 (two) times daily., Disp: 60 tablet, Rfl: 3   Semaglutide  (RYBELSUS ) 3 MG TABS, Take 1 tablet (3 mg total) by mouth daily., Disp: 30 tablet, Rfl: 4   Tafamidis  (VYNDAMAX ) 61 MG CAPS, Take 1 capsule by mouth daily., Disp: 30 capsule, Rfl: 11   torsemide  (DEMADEX ) 20 MG tablet, Take 1 tablet (20 mg total) by mouth every other day., Disp: 45 tablet, Rfl: 3   spironolactone  (ALDACTONE ) 25 MG tablet, Take 0.5 tablets (12.5 mg total) by mouth daily., Disp: 45 tablet, Rfl: 3 Allergies  Allergen Reactions   Bee Venom Anaphylaxis, Swelling and Other (See Comments)    Swells all over     Social History   Socioeconomic History   Marital status: Single    Spouse name: Not on file   Number of children: Not on file   Years of education: Not on file   Highest education level: Not on file  Occupational History   Not on file  Tobacco Use   Smoking status: Former    Current packs/day: 0.00    Types: Cigarettes    Quit date: 10/06/2017    Years since quitting: 5.7   Smokeless tobacco: Never  Substance and Sexual Activity   Alcohol use: Yes    Comment: Occasional Beer   Drug use: Yes    Types: Marijuana   Sexual activity: Not on file  Other Topics Concern   Not on file  Social History Narrative   Right Handed.    Lives in a two story home    Lives with 26 year old son.    Social Drivers of Corporate Investment Banker Strain: Low Risk  (01/18/2022)   Overall Financial Resource Strain (CARDIA)    Difficulty of Paying Living Expenses: Not hard at all  Food Insecurity: No Food Insecurity (09/21/2022)   Hunger Vital Sign    Worried About Running Out of Food in the Last Year: Never true    Ran Out of Food in the Last Year: Never true  Transportation Needs: No Transportation Needs (06/04/2022)   PRAPARE - Administrator, Civil Service (Medical): No    Lack of Transportation (Non-Medical): No  Physical Activity: Inactive (01/18/2022)   Exercise Vital Sign    Days of  Exercise per Week: 0 days    Minutes of Exercise per Session: 0 min  Stress: Stress Concern Present (01/18/2022)   [employer] of Occupational Health - Occupational Stress Questionnaire    Feeling of Stress : To some extent  Social Connections: Socially Isolated (01/18/2022)   Social Connection and Isolation Panel [NHANES]    Frequency of Communication with Friends and Family: More than three times a week    Frequency of Social Gatherings with Friends and Family: More than three times a week    Attends Religious Services: Never    Database Administrator or Organizations: No    Attends Banker Meetings: Never    Marital Status:  Never married  Intimate Partner Violence: Not At Risk (01/18/2022)   Humiliation, Afraid, Rape, and Kick questionnaire    Fear of Current or Ex-Partner: No    Emotionally Abused: No    Physically Abused: No    Sexually Abused: No    Physical Exam      Future Appointments  Date Time Provider Department Center  08/18/2023 12:00 PM Luke Chiquita SAUNDERS, DO [facility]

## 2023-07-01 ENCOUNTER — Other Ambulatory Visit: Payer: Self-pay

## 2023-07-01 ENCOUNTER — Other Ambulatory Visit (HOSPITAL_COMMUNITY): Payer: Self-pay

## 2023-07-06 ENCOUNTER — Other Ambulatory Visit (HOSPITAL_COMMUNITY): Payer: Self-pay

## 2023-07-06 ENCOUNTER — Other Ambulatory Visit (HOSPITAL_COMMUNITY): Payer: Self-pay | Admitting: Emergency Medicine

## 2023-07-06 NOTE — Progress Notes (Unsigned)
Paramedicine Encounter    Patient ID: Thomas West, male    DOB: [birth year], 63 y.o.   MRN: [id number]   Complaints NONE  Assessment A&O x 4, skin W&D w/ good color. Denies chest pain or SOB.  Lung sounds clear and equal bilat. No peripheral edema noted.  Compliance with meds YES  Pill box filled x 1 week  Refills needed Hydralazine  Meds changes since last visit none    Social changes NONE   BP 108/78 (BP Location: Left Arm)   Pulse 83   Resp 16   Wt 166 lb 3.2 oz (75.4 kg)   SpO2 94%   BMI 24.54 kg/m  Weight yesterday- not taken Last visit weight-169lb  ACTION: {Paramed [phone]}  Beatrix Shipper, EMT-Paramedic [phone] 07/06/23  Patient Care Team: [name], [name], MD as PCP - General (Family Medicine) Bensimhon, Bevelyn Buckles, MD as PCP - Cardiology (Cardiology) Glendale Chard, DO as Consulting Physician (Neurology) Sherrill Raring, [facility] (Pharmacist) Sherrill Raring, [facility] (Pharmacist)  Patient Active Problem List   Diagnosis Date Noted  . 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 Pruritic rash 05/25/2022  . Polyneuropathy associated with underlying disease (HCC) 01/15/2022  . Atherosclerosis of aorta (HCC) 01/15/2022  . History of CVA (cerebrovascular accident) without residual deficits 12/24/2021  . AKI (acute kidney injury) (HCC) 12/13/2021  . Diarrhea 12/13/2021  . Shortness of breath 12/13/2021  . Chest pain 12/13/2021  . Nausea and vomiting 12/13/2021  . Heart failure (HCC) 12/08/2021  . Hypertension associated with diabetes (HCC) 07/12/2021  . Chronic kidney disease, stage 3a (HCC) 07/12/2021  . Hyperkalemia 07/12/2021  . CHF exacerbation (HCC) 04/20/2021  . HFrEF (heart failure with reduced ejection fraction) (HCC) 02/03/2021  . Diabetes mellitus (HCC) 03/27/2019  . Hyperlipidemia associated with type 2 diabetes mellitus (HCC) 03/27/2019  . CAD (coronary artery disease) 03/05/2019  . History of MI (myocardial infarction) 03/05/2019  .  Type 2 diabetes mellitus with diabetic neuropathy, unspecified (HCC) 03/05/2019  . HLD (hyperlipidemia) 03/05/2019  . GERD (gastroesophageal reflux disease) 03/05/2019  . Hypertension, essential, benign 03/05/2019    Current Outpatient Medications:  .  albuterol (VENTOLIN HFA) 108 (90 Base) MCG/ACT inhaler, Inhale 1-2 puffs into the lungs every 6 (six) hours for 1 week, then as needed., Disp: 6.7 g, Rfl: 0 .  aspirin EC 81 MG tablet, Take 1 tablet (81 mg total) by mouth daily. Swallow whole., Disp: 30 tablet, Rfl: 11 .  atorvastatin (LIPITOR) 80 MG tablet, Take 1 tablet (80 mg total) by mouth daily., Disp: 90 tablet, Rfl: 3 .  carvedilol (COREG) 12.5 MG tablet, Take 1 tablet (12.5 mg total) by mouth 2 (two) times daily., Disp: 180 tablet, Rfl: 3 .  clopidogrel (PLAVIX) 75 MG tablet, Take 1 tablet (75 mg total) by mouth daily., Disp: 90 tablet, Rfl: 2 .  cyanocobalamin (VITAMIN B12) 1000 MCG tablet, Take 1 tablet (1,000 mcg total) by mouth daily., Disp: 130 tablet, Rfl: 3 .  DULoxetine (CYMBALTA) 30 MG capsule, Take 1 capsule (30 mg total) by mouth daily., Disp: 90 capsule, Rfl: 2 .  empagliflozin (JARDIANCE) 25 MG TABS tablet, Take 1 tablet (25 mg total) by mouth daily before breakfast., Disp: 90 tablet, Rfl: 0 .  ezetimibe (ZETIA) 10 MG tablet, Take 1 tablet (10 mg total) by mouth daily., Disp: 30 tablet, Rfl: 3 .  hydrALAZINE (APRESOLINE) 50 MG tablet, Take 2 tablets (100 mg total) by mouth 3 (three) times daily., Disp: 180 tablet, Rfl: 11 .  hydrocortisone cream  1 %, Apply 1 Application topically 2 (two) times daily as needed for itching., Disp: 45 g, Rfl: 1 .  isosorbide mononitrate (IMDUR) 60 MG 24 hr tablet, Take 1.5 tablets (90 mg total) by mouth daily., Disp: 45 tablet, Rfl: 6 .  linagliptin (TRADJENTA) 5 MG TABS tablet, Take 1 tablet (5 mg total) by mouth daily., Disp: 30 tablet, Rfl: 3 .  sacubitril-valsartan (ENTRESTO) 97-103 MG, Take 1 tablet by mouth 2 (two) times daily., Disp: 60  tablet, Rfl: 3 .  Semaglutide (RYBELSUS) 3 MG TABS, Take 1 tablet (3 mg total) by mouth daily., Disp: 30 tablet, Rfl: 4 .  spironolactone (ALDACTONE) 25 MG tablet, Take 0.5 tablets (12.5 mg total) by mouth daily., Disp: 45 tablet, Rfl: 3 .  Tafamidis (VYNDAMAX) 61 MG CAPS, Take 1 capsule by mouth daily., Disp: 30 capsule, Rfl: 11 .  torsemide (DEMADEX) 20 MG tablet, Take 1 tablet (20 mg total) by mouth every other day., Disp: 45 tablet, Rfl: 3 Allergies  Allergen Reactions  . Bee Venom Anaphylaxis, Swelling and Other (See Comments)    Swells all over     Social History   Socioeconomic History  . Marital status: Single    Spouse name: Not on file  . Number of children: Not on file  . Years of education: Not on file  . Highest education level: Not on file  Occupational History  . Not on file  Tobacco Use  . Smoking status: Former    Current packs/day: 0.00    Types: Cigarettes    Quit date: 10/06/2017    Years since quitting: 5.7  . Smokeless tobacco: Never  Substance and Sexual Activity  . Alcohol use: Yes    Comment: Occasional Beer  . Drug use: Yes    Types: Marijuana  . Sexual activity: Not on file  Other Topics Concern  . Not on file  Social History Narrative   Right Handed.    Lives in a two story home    Lives with 52 year old son.    Social Drivers of Corporate investment banker Strain: Low Risk  (01/18/2022)   Overall Financial Resource Strain (CARDIA)   . Difficulty of Paying Living Expenses: Not hard at all  Food Insecurity: No Food Insecurity (09/21/2022)   Hunger Vital Sign   . Worried About Programme researcher, broadcasting/film/video in the Last Year: Never true   . Ran Out of Food in the Last Year: Never true  Transportation Needs: No Transportation Needs (06/04/2022)   PRAPARE - Transportation   . Lack of Transportation (Medical): No   . Lack of Transportation (Non-Medical): No  Physical Activity: Inactive (01/18/2022)   Exercise Vital Sign   . Days of Exercise per Week: 0  days   . Minutes of Exercise per Session: 0 min  Stress: Stress Concern Present (01/18/2022)   [employer] of Occupational Health - Occupational Stress Questionnaire   . Feeling of Stress : To some extent  Social Connections: Socially Isolated (01/18/2022)   Social Connection and Isolation Panel [NHANES]   . Frequency of Communication with Friends and Family: More than three times a week   . Frequency of Social Gatherings with Friends and Family: More than three times a week   . Attends Religious Services: Never   . Active Member of Clubs or Organizations: No   . Attends Banker Meetings: Never   . Marital Status: Never married  Intimate Partner Violence: Not At Risk (01/18/2022)  Humiliation, Afraid, Rape, and Kick questionnaire   . Fear of Current or Ex-Partner: No   . Emotionally Abused: No   . Physically Abused: No   . Sexually Abused: No    Physical Exam      Future Appointments  Date Time Provider Department Center  08/18/2023 12:00 PM Terressa Koyanagi, DO [facility]

## 2023-07-11 ENCOUNTER — Other Ambulatory Visit: Payer: Self-pay

## 2023-07-11 ENCOUNTER — Other Ambulatory Visit (HOSPITAL_COMMUNITY): Payer: Self-pay

## 2023-07-13 ENCOUNTER — Other Ambulatory Visit (HOSPITAL_COMMUNITY): Payer: Self-pay | Admitting: Emergency Medicine

## 2023-07-13 ENCOUNTER — Other Ambulatory Visit: Payer: Self-pay

## 2023-07-13 NOTE — Progress Notes (Signed)
Specialty Pharmacy Refill Coordination Note  Thomas West is a 63 y.o. male contacted today regarding refills of specialty medication(s) Tafamidis [name]   Delivery date: 07/26/23   Verified address: [address] [place], [place] [address]   Medication will be filled on 07/25/2023.

## 2023-07-13 NOTE — Progress Notes (Signed)
Specialty Pharmacy Ongoing Clinical Assessment Note  Thomas West is a 63 y.o. male who is being followed by the specialty pharmacy service for RxSp Cardiology   Patient's specialty medication(s) reviewed today: Tafamidis (Vyndamax)   Missed doses in the last 4 weeks: 2   Patient/Caregiver did not have any additional questions or concerns.   Therapeutic benefit summary: Unable to assess   Adverse events/side effects summary: No adverse events/side effects   Patient's therapy is appropriate to: Continue    Goals Addressed             This Visit's Progress    Slow Disease Progression       Patient is on track. Patient will maintain adherence         Follow up:  6 months  Thomas West [facility] Specialty Pharmacist

## 2023-07-13 NOTE — Progress Notes (Unsigned)
Paramedicine Encounter    Patient ID: Thomas West, male    DOB: 11-09-1960, 63 y.o.   MRN: [id number]   Complaints***  Assessment***  Compliance with meds***  Pill box filled***  Refills needed***  Meds changes since last visit***    Social changes***   There were no vitals taken for this visit. Weight yesterday-*** Last visit weight-***  ACTION: {Paramed Action:[phone]}  Beatrix Shipper, EMT-Paramedic [phone] 07/13/23  Patient Care Team: [name], [name], MD as PCP - General (Family Medicine) Bensimhon, Bevelyn Buckles, MD as PCP - Cardiology (Cardiology) Glendale Chard, DO as Consulting Physician (Neurology) Sherrill Raring, [facility] (Pharmacist) Sherrill Raring, [facility] (Pharmacist)  Patient Active Problem List   Diagnosis Date Noted  . 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 Pruritic rash 05/25/2022  . Polyneuropathy associated with underlying disease (HCC) 01/15/2022  . Atherosclerosis of aorta (HCC) 01/15/2022  . History of CVA (cerebrovascular accident) without residual deficits 12/24/2021  . AKI (acute kidney injury) (HCC) 12/13/2021  . Diarrhea 12/13/2021  . Shortness of breath 12/13/2021  . Chest pain 12/13/2021  . Nausea and vomiting 12/13/2021  . Heart failure (HCC) 12/08/2021  . Hypertension associated with diabetes (HCC) 07/12/2021  . Chronic kidney disease, stage 3a (HCC) 07/12/2021  . Hyperkalemia 07/12/2021  . CHF exacerbation (HCC) 04/20/2021  . HFrEF (heart failure with reduced ejection fraction) (HCC) 02/03/2021  . Diabetes mellitus (HCC) 03/27/2019  . Hyperlipidemia associated with type 2 diabetes mellitus (HCC) 03/27/2019  . CAD (coronary artery disease) 03/05/2019  . History of MI (myocardial infarction) 03/05/2019  . Type 2 diabetes mellitus with diabetic neuropathy, unspecified (HCC) 03/05/2019  . HLD (hyperlipidemia) 03/05/2019  . GERD (gastroesophageal reflux disease) 03/05/2019  . Hypertension, essential, benign 03/05/2019     Current Outpatient Medications:  .  albuterol (VENTOLIN HFA) 108 (90 Base) MCG/ACT inhaler, Inhale 1-2 puffs into the lungs every 6 (six) hours for 1 week, then as needed., Disp: 6.7 g, Rfl: 0 .  aspirin EC 81 MG tablet, Take 1 tablet (81 mg total) by mouth daily. Swallow whole., Disp: 30 tablet, Rfl: 11 .  atorvastatin (LIPITOR) 80 MG tablet, Take 1 tablet (80 mg total) by mouth daily., Disp: 90 tablet, Rfl: 3 .  carvedilol (COREG) 12.5 MG tablet, Take 1 tablet (12.5 mg total) by mouth 2 (two) times daily., Disp: 180 tablet, Rfl: 3 .  clopidogrel (PLAVIX) 75 MG tablet, Take 1 tablet (75 mg total) by mouth daily., Disp: 90 tablet, Rfl: 2 .  cyanocobalamin (VITAMIN B12) 1000 MCG tablet, Take 1 tablet (1,000 mcg total) by mouth daily., Disp: 130 tablet, Rfl: 3 .  DULoxetine (CYMBALTA) 30 MG capsule, Take 1 capsule (30 mg total) by mouth daily., Disp: 90 capsule, Rfl: 2 .  empagliflozin (JARDIANCE) 25 MG TABS tablet, Take 1 tablet (25 mg total) by mouth daily before breakfast., Disp: 90 tablet, Rfl: 0 .  ezetimibe (ZETIA) 10 MG tablet, Take 1 tablet (10 mg total) by mouth daily., Disp: 30 tablet, Rfl: 3 .  hydrALAZINE (APRESOLINE) 50 MG tablet, Take 2 tablets (100 mg total) by mouth 3 (three) times daily., Disp: 180 tablet, Rfl: 11 .  hydrocortisone cream 1 %, Apply 1 Application topically 2 (two) times daily as needed for itching., Disp: 45 g, Rfl: 1 .  isosorbide mononitrate (IMDUR) 60 MG 24 hr tablet, Take 1.5 tablets (90 mg total) by mouth daily., Disp: 45 tablet, Rfl: 6 .  linagliptin (TRADJENTA) 5 MG TABS tablet, Take 1 tablet (5 mg total) by mouth  daily., Disp: 30 tablet, Rfl: 3 .  sacubitril-valsartan (ENTRESTO) 97-103 MG, Take 1 tablet by mouth 2 (two) times daily., Disp: 60 tablet, Rfl: 3 .  Semaglutide (RYBELSUS) 3 MG TABS, Take 1 tablet (3 mg total) by mouth daily., Disp: 30 tablet, Rfl: 4 .  spironolactone (ALDACTONE) 25 MG tablet, Take 0.5 tablets (12.5 mg total) by mouth daily.,  Disp: 45 tablet, Rfl: 3 .  Tafamidis (VYNDAMAX) 61 MG CAPS, Take 1 capsule by mouth daily., Disp: 30 capsule, Rfl: 11 .  torsemide (DEMADEX) 20 MG tablet, Take 1 tablet (20 mg total) by mouth every other day., Disp: 45 tablet, Rfl: 3 Allergies  Allergen Reactions  . Bee Venom Anaphylaxis, Swelling and Other (See Comments)    Swells all over     Social History   Socioeconomic History  . Marital status: Single    Spouse name: Not on file  . Number of children: Not on file  . Years of education: Not on file  . Highest education level: Not on file  Occupational History  . Not on file  Tobacco Use  . Smoking status: Former    Current packs/day: 0.00    Types: Cigarettes    Quit date: 10/06/2017    Years since quitting: 5.7  . Smokeless tobacco: Never  Substance and Sexual Activity  . Alcohol use: Yes    Comment: Occasional Beer  . Drug use: Yes    Types: Marijuana  . Sexual activity: Not on file  Other Topics Concern  . Not on file  Social History Narrative   Right Handed.    Lives in a two story home    Lives with 34 year old son.    Social Drivers of Corporate investment banker Strain: Low Risk  (01/18/2022)   Overall Financial Resource Strain (CARDIA)   . Difficulty of Paying Living Expenses: Not hard at all  Food Insecurity: No Food Insecurity (09/21/2022)   Hunger Vital Sign   . Worried About Programme researcher, broadcasting/film/video in the Last Year: Never true   . Ran Out of Food in the Last Year: Never true  Transportation Needs: No Transportation Needs (06/04/2022)   PRAPARE - Transportation   . Lack of Transportation (Medical): No   . Lack of Transportation (Non-Medical): No  Physical Activity: Inactive (01/18/2022)   Exercise Vital Sign   . Days of Exercise per Week: 0 days   . Minutes of Exercise per Session: 0 min  Stress: Stress Concern Present (01/18/2022)   [employer] of Occupational Health - Occupational Stress Questionnaire   . Feeling of Stress : To some extent   Social Connections: Socially Isolated (01/18/2022)   Social Connection and Isolation Panel [NHANES]   . Frequency of Communication with Friends and Family: More than three times a week   . Frequency of Social Gatherings with Friends and Family: More than three times a week   . Attends Religious Services: Never   . Active Member of Clubs or Organizations: No   . Attends Banker Meetings: Never   . Marital Status: Never married  Intimate Partner Violence: Not At Risk (01/18/2022)   Humiliation, Afraid, Rape, and Kick questionnaire   . Fear of Current or Ex-Partner: No   . Emotionally Abused: No   . Physically Abused: No   . Sexually Abused: No    Physical Exam      Future Appointments  Date Time Provider Department Center  08/18/2023 12:00 PM Terressa Koyanagi, DO  [facility]

## 2023-07-15 ENCOUNTER — Other Ambulatory Visit: Payer: Self-pay | Admitting: Family Medicine

## 2023-07-15 NOTE — Telephone Encounter (Signed)
Copied from CRM [id number]. Topic: Clinical - Medication Refill >> Jul 15, 2023  9:11 AM Deaijah H wrote: Most Recent Primary Care Visit:  Provider: [name], [name]  Department: [facility]  Visit Type: OFFICE VISIT  Date: 05/11/2023  Medication: linagliptin (TRADJENTA) 5 MG TABS tablet (would like 100 day supply)  Has the patient contacted their pharmacy? Yes (Agent: If no, request that the patient contact the pharmacy for the refill. If patient does not wish to contact the pharmacy document the reason why and proceed with request.) (Agent: If yes, when and what did the pharmacy advise?)  Is this the correct pharmacy for this prescription? Yes If no, delete pharmacy and type the correct one.  This is the patient's preferred pharmacy:  [facility] [address] [place] [address] Phone: [phone] Fax: [phone]   Has the prescription been filled recently? Yes  Is the patient out of the medication? No  Has the patient been seen for an appointment in the last year OR does the patient have an upcoming appointment? Yes  Can we respond through MyChart? No  Agent: Please be advised that Rx refills may take up to 3 business days. We ask that you follow-up with your pharmacy.

## 2023-07-18 ENCOUNTER — Other Ambulatory Visit: Payer: Self-pay

## 2023-07-18 ENCOUNTER — Other Ambulatory Visit (HOSPITAL_COMMUNITY): Payer: Self-pay

## 2023-07-18 MED ORDER — LINAGLIPTIN 5 MG PO TABS
5.0000 mg | ORAL_TABLET | Freq: Every day | ORAL | 3 refills | Status: DC
  Filled 2023-07-18: qty 30, 30d supply, fill #0
  Filled 2023-08-11: qty 30, 30d supply, fill #1
  Filled 2023-09-26: qty 30, 30d supply, fill #2
  Filled 2023-10-19 – 2023-11-18 (×7): qty 30, 30d supply, fill #3

## 2023-07-20 ENCOUNTER — Other Ambulatory Visit: Payer: Self-pay

## 2023-07-20 ENCOUNTER — Other Ambulatory Visit (HOSPITAL_COMMUNITY): Payer: Self-pay | Admitting: Emergency Medicine

## 2023-07-20 ENCOUNTER — Other Ambulatory Visit (HOSPITAL_COMMUNITY): Payer: Self-pay

## 2023-07-20 NOTE — Progress Notes (Signed)
 Paramedicine Encounter    Patient ID: Thomas West, male    DOB: 1960/12/10, 63 y.o.   MRN: [id number]   Complaints Diarrhea w/ gas x 3 days  Assessment A&O x 4, skin W&D w/ good color.  Pt. Denies chest pain or SOB.  Lung sounds clear throughout and no peripheral edema noted  Compliance with meds YES  Pill box filled x 1 week  Refills needed Hydralazine  Meds changes since last visit NONE    Social changes NONE  Hydralazine was ordered from [facility] 2/12 and has not yet been delivered.  Called pharmacy to f/u and they advised they had made an error on not delivered it and put the prescription back in stock.  They advised they will get it set up to be delivered tomorrow.  Pt will call me back tomorrow when meds arrive.  BP 130/80 (BP Location: Left Arm, Patient Position: Sitting, Cuff Size: Normal)   Pulse 87   Resp 16   Wt 166 lb 3.2 oz (75.4 kg)   SpO2 97%   BMI 24.54 kg/m  Weight yesterday- not taken Last visit weight-163.8lb  ACTION: Home visit completed  [name] [phone] 07/20/23  Patient Care Team: [name], [name], MD as PCP - General (Family Medicine) Bensimhon, Bevelyn Buckles, MD as PCP - Cardiology (Cardiology) Glendale Chard, DO as Consulting Physician (Neurology) Sherrill Raring, [facility] (Pharmacist) Sherrill Raring, [facility] (Pharmacist)  Patient Active Problem List   Diagnosis Date Noted   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   Pruritic rash 05/25/2022   Polyneuropathy associated with underlying disease (HCC) 01/15/2022   Atherosclerosis of aorta (HCC) 01/15/2022   History of CVA (cerebrovascular accident) without residual deficits 12/24/2021   AKI (acute kidney injury) (HCC) 12/13/2021   Diarrhea 12/13/2021   Shortness of breath 12/13/2021   Chest pain 12/13/2021   Nausea and vomiting 12/13/2021   Heart failure (HCC) 12/08/2021   Hypertension associated with diabetes (HCC) 07/12/2021   Chronic kidney disease, stage 3a  (HCC) 07/12/2021   Hyperkalemia 07/12/2021   CHF exacerbation (HCC) 04/20/2021   HFrEF (heart failure with reduced ejection fraction) (HCC) 02/03/2021   Diabetes mellitus (HCC) 03/27/2019   Hyperlipidemia associated with type 2 diabetes mellitus (HCC) 03/27/2019   CAD (coronary artery disease) 03/05/2019   History of MI (myocardial infarction) 03/05/2019   Type 2 diabetes mellitus with diabetic neuropathy, unspecified (HCC) 03/05/2019   HLD (hyperlipidemia) 03/05/2019   GERD (gastroesophageal reflux disease) 03/05/2019   Hypertension, essential, benign 03/05/2019    Current Outpatient Medications:    albuterol (VENTOLIN HFA) 108 (90 Base) MCG/ACT inhaler, Inhale 1-2 puffs into the lungs every 6 (six) hours for 1 week, then as needed., Disp: 6.7 g, Rfl: 0   aspirin EC 81 MG tablet, Take 1 tablet (81 mg total) by mouth daily. Swallow whole., Disp: 30 tablet, Rfl: 11   atorvastatin (LIPITOR) 80 MG tablet, Take 1 tablet (80 mg total) by mouth daily., Disp: 90 tablet, Rfl: 3   carvedilol (COREG) 12.5 MG tablet, Take 1 tablet (12.5 mg total) by mouth 2 (two) times daily., Disp: 180 tablet, Rfl: 3   clopidogrel (PLAVIX) 75 MG tablet, Take 1 tablet (75 mg total) by mouth daily., Disp: 90 tablet, Rfl: 2   cyanocobalamin (VITAMIN B12) 1000 MCG tablet, Take 1 tablet (1,000 mcg total) by mouth daily., Disp: 130 tablet, Rfl: 3   DULoxetine (CYMBALTA) 30 MG capsule, Take 1 capsule (30 mg total) by mouth daily., Disp: 90 capsule, Rfl:  2   empagliflozin (JARDIANCE) 25 MG TABS tablet, Take 1 tablet (25 mg total) by mouth daily before breakfast., Disp: 90 tablet, Rfl: 0   ezetimibe (ZETIA) 10 MG tablet, Take 1 tablet (10 mg total) by mouth daily., Disp: 30 tablet, Rfl: 3   hydrALAZINE (APRESOLINE) 50 MG tablet, Take 2 tablets (100 mg total) by mouth 3 (three) times daily., Disp: 180 tablet, Rfl: 11   hydrocortisone cream 1 %, Apply 1 Application topically 2 (two) times daily as needed for itching., Disp: 45  g, Rfl: 1   isosorbide mononitrate (IMDUR) 60 MG 24 hr tablet, Take 1.5 tablets (90 mg total) by mouth daily., Disp: 45 tablet, Rfl: 6   linagliptin (TRADJENTA) 5 MG TABS tablet, Take 1 tablet (5 mg total) by mouth daily., Disp: 30 tablet, Rfl: 3   sacubitril-valsartan (ENTRESTO) 97-103 MG, Take 1 tablet by mouth 2 (two) times daily., Disp: 60 tablet, Rfl: 3   Semaglutide (RYBELSUS) 3 MG TABS, Take 1 tablet (3 mg total) by mouth daily., Disp: 30 tablet, Rfl: 4   spironolactone (ALDACTONE) 25 MG tablet, Take 0.5 tablets (12.5 mg total) by mouth daily., Disp: 45 tablet, Rfl: 3   Tafamidis (VYNDAMAX) 61 MG CAPS, Take 1 capsule by mouth daily., Disp: 30 capsule, Rfl: 11   torsemide (DEMADEX) 20 MG tablet, Take 1 tablet (20 mg total) by mouth every other day., Disp: 45 tablet, Rfl: 3 Allergies  Allergen Reactions   Bee Venom Anaphylaxis, Swelling and Other (See Comments)    Swells all over     Social History   Socioeconomic History   Marital status: Single    Spouse name: Not on file   Number of children: Not on file   Years of education: Not on file   Highest education level: Not on file  Occupational History   Not on file  Tobacco Use   Smoking status: Former    Current packs/day: 0.00    Types: Cigarettes    Quit date: 10/06/2017    Years since quitting: 5.7   Smokeless tobacco: Never  Substance and Sexual Activity   Alcohol use: Yes    Comment: Occasional Beer   Drug use: Yes    Types: Marijuana   Sexual activity: Not on file  Other Topics Concern   Not on file  Social History Narrative   Right Handed.    Lives in a two story home    Lives with 55 year old son.    Social Drivers of Corporate investment banker Strain: Low Risk  (01/18/2022)   Overall Financial Resource Strain (CARDIA)    Difficulty of Paying Living Expenses: Not hard at all  Food Insecurity: No Food Insecurity (09/21/2022)   Hunger Vital Sign    Worried About Running Out of Food in the Last Year: Never  true    Ran Out of Food in the Last Year: Never true  Transportation Needs: No Transportation Needs (06/04/2022)   PRAPARE - Administrator, Civil Service (Medical): No    Lack of Transportation (Non-Medical): No  Physical Activity: Inactive (01/18/2022)   Exercise Vital Sign    Days of Exercise per Week: 0 days    Minutes of Exercise per Session: 0 min  Stress: Stress Concern Present (01/18/2022)   [employer] of Occupational Health - Occupational Stress Questionnaire    Feeling of Stress : To some extent  Social Connections: Socially Isolated (01/18/2022)   Social Connection and Isolation Panel [NHANES]  Frequency of Communication with Friends and Family: More than three times a week    Frequency of Social Gatherings with Friends and Family: More than three times a week    Attends Religious Services: Never    Database administrator or Organizations: No    Attends Banker Meetings: Never    Marital Status: Never married  Intimate Partner Violence: Not At Risk (01/18/2022)   Humiliation, Afraid, Rape, and Kick questionnaire    Fear of Current or Ex-Partner: No    Emotionally Abused: No    Physically Abused: No    Sexually Abused: No    Physical Exam      Future Appointments  Date Time Provider Department Center  08/18/2023 12:00 PM Terressa Koyanagi, DO [facility]

## 2023-07-25 ENCOUNTER — Other Ambulatory Visit: Payer: Self-pay | Admitting: Family Medicine

## 2023-07-25 ENCOUNTER — Other Ambulatory Visit: Payer: Self-pay

## 2023-07-25 ENCOUNTER — Other Ambulatory Visit (HOSPITAL_COMMUNITY): Payer: Self-pay

## 2023-07-25 DIAGNOSIS — I5020 Unspecified systolic (congestive) heart failure: Secondary | ICD-10-CM

## 2023-07-25 DIAGNOSIS — E1140 Type 2 diabetes mellitus with diabetic neuropathy, unspecified: Secondary | ICD-10-CM

## 2023-07-25 MED ORDER — EMPAGLIFLOZIN 25 MG PO TABS
25.0000 mg | ORAL_TABLET | Freq: Every day | ORAL | 2 refills | Status: DC
  Filled 2023-07-25: qty 90, 90d supply, fill #0
  Filled 2023-10-24: qty 90, 90d supply, fill #1
  Filled 2024-01-16: qty 90, 90d supply, fill #2

## 2023-07-27 ENCOUNTER — Other Ambulatory Visit (HOSPITAL_COMMUNITY): Payer: Self-pay

## 2023-07-27 ENCOUNTER — Other Ambulatory Visit: Payer: Self-pay | Admitting: Family Medicine

## 2023-07-27 MED ORDER — EZETIMIBE 10 MG PO TABS
10.0000 mg | ORAL_TABLET | Freq: Every day | ORAL | 1 refills | Status: DC
  Filled 2023-07-27: qty 90, 90d supply, fill #0
  Filled 2023-10-25: qty 90, 90d supply, fill #1

## 2023-08-02 ENCOUNTER — Other Ambulatory Visit (HOSPITAL_COMMUNITY): Payer: Self-pay

## 2023-08-03 ENCOUNTER — Other Ambulatory Visit (HOSPITAL_COMMUNITY): Payer: Self-pay | Admitting: Emergency Medicine

## 2023-08-03 NOTE — Progress Notes (Signed)
 Paramedicine Encounter    Patient ID: Thomas West, male    DOB: [birth year], 63 y.o.   MRN: [id number]   Complaints Runny nose and cough  Assessment A&O x 4, skin W&D w/ good color.  Denies chest pain or SOB.  Lung sounds clear in the upper and diminished in the lower lobes.  Some edema noted to lower extremities.  BP elevated.  Weight up 7lbs.  Has taken no meds today.  Compliance with meds NO  Pill box filled x 2 weeks  Refills needed Rybelsus  Meds changes since last visit NONE    Social changes NONE   BP (!) 210/120 (BP Location: Left Arm, Patient Position: Sitting, Cuff Size: Normal)   Pulse 92   Wt 173 lb 12.8 oz (78.8 kg)   SpO2 95%   BMI 25.67 kg/m  Weight yesterday- not taken Last visit weight-166lb  [name] A&O x 4.  His visit today had been scheduled at 8:30 but he called and asked to be moved to afternoon due do a morning dental appointment. Pt denies chest pain.  He does say that he has a productive cough w/ thick white secretions.  He has not taken any of his meds today and overall is non-compliant.  He states, "I have been fighting a cold and my nose has been running like crazy."  His sister has been giving him Sudafed which I advised that was on the list of OTC's to avoid. Pt. Is hypertensive.  He denies headache or visual disturbances.  Denies chest pain.  His weight is up 7lbs from last visit and he has non-pitting edema to his lower extremities.  Pt does admit to making poor nutritional choices and drank "some" alcohol at his sisters birthday party.  I did discuss the dangers of his blood pressure being high and the possibility of a stroke.  Pt states, "I am not going to the hospital."   I reached out to HF Triage regarding today's presentation and will wait for possible orders.  He tells me he and his family are heading to [facility] for the evening and it may be tomorrow before he returns.  Med box reconciled x 2 weeks.  He took his morning meds during this  visit.  I will reach out to him tomorrow to follow up on BP and any med changes.  ACTION: Home visit completed  Thomas West [phone] 08/03/23  Patient Care Team: [name], [name], MD as PCP - General (Family Medicine) Bensimhon, Bevelyn Buckles, MD as PCP - Cardiology (Cardiology) Thomas Chard, DO as Consulting Physician (Neurology) Thomas West, [facility] (Pharmacist) Thomas West, [facility] (Pharmacist)  Patient Active Problem List   Diagnosis Date Noted   Cardiac amyloidosis [facility]) 05/25/2022   Pruritic rash 05/25/2022   Polyneuropathy associated with underlying disease (HCC) 01/15/2022   Atherosclerosis of aorta (HCC) 01/15/2022   History of CVA (cerebrovascular accident) without residual deficits 12/24/2021   AKI (acute kidney injury) (HCC) 12/13/2021   Diarrhea 12/13/2021   Shortness of breath 12/13/2021   Chest pain 12/13/2021   Nausea and vomiting 12/13/2021   Heart failure (HCC) 12/08/2021   Hypertension associated with diabetes (HCC) 07/12/2021   Chronic kidney disease, stage 3a (HCC) 07/12/2021   Hyperkalemia 07/12/2021   CHF exacerbation (HCC) 04/20/2021   HFrEF (heart failure with reduced ejection fraction) (HCC) 02/03/2021   Diabetes mellitus (HCC) 03/27/2019   Hyperlipidemia associated with type 2 diabetes mellitus (HCC) 03/27/2019   CAD (coronary artery disease)  03/05/2019   History of MI (myocardial infarction) 03/05/2019   Type 2 diabetes mellitus with diabetic neuropathy, unspecified (HCC) 03/05/2019   HLD (hyperlipidemia) 03/05/2019   GERD (gastroesophageal reflux disease) 03/05/2019   Hypertension, essential, benign 03/05/2019    Current Outpatient Medications:    albuterol (VENTOLIN HFA) 108 (90 Base) MCG/ACT inhaler, Inhale 1-2 puffs into the lungs every 6 (six) hours for 1 week, then as needed., Disp: 6.7 West, Rfl: 0   aspirin EC 81 MG tablet, Take 1 tablet (81 mg total) by mouth daily. Swallow whole., Disp: 30 tablet, Rfl: 11    atorvastatin (LIPITOR) 80 MG tablet, Take 1 tablet (80 mg total) by mouth daily., Disp: 90 tablet, Rfl: 3   carvedilol (COREG) 12.5 MG tablet, Take 1 tablet (12.5 mg total) by mouth 2 (two) times daily., Disp: 180 tablet, Rfl: 3   clopidogrel (PLAVIX) 75 MG tablet, Take 1 tablet (75 mg total) by mouth daily., Disp: 90 tablet, Rfl: 2   cyanocobalamin (VITAMIN B12) 1000 MCG tablet, Take 1 tablet (1,000 mcg total) by mouth daily., Disp: 130 tablet, Rfl: 3   DULoxetine (CYMBALTA) 30 MG capsule, Take 1 capsule (30 mg total) by mouth daily., Disp: 90 capsule, Rfl: 2   empagliflozin (JARDIANCE) 25 MG TABS tablet, Take 1 tablet (25 mg total) by mouth daily before breakfast., Disp: 90 tablet, Rfl: 2   ezetimibe (ZETIA) 10 MG tablet, Take 1 tablet (10 mg total) by mouth daily., Disp: 90 tablet, Rfl: 1   hydrALAZINE (APRESOLINE) 50 MG tablet, Take 2 tablets (100 mg total) by mouth 3 (three) times daily., Disp: 180 tablet, Rfl: 11   hydrocortisone cream 1 %, Apply 1 Application topically 2 (two) times daily as needed for itching., Disp: 45 West, Rfl: 1   isosorbide mononitrate (IMDUR) 60 MG 24 hr tablet, Take 1.5 tablets (90 mg total) by mouth daily., Disp: 45 tablet, Rfl: 6   linagliptin (TRADJENTA) 5 MG TABS tablet, Take 1 tablet (5 mg total) by mouth daily., Disp: 30 tablet, Rfl: 3   sacubitril-valsartan (ENTRESTO) 97-103 MG, Take 1 tablet by mouth 2 (two) times daily., Disp: 60 tablet, Rfl: 3   Semaglutide (RYBELSUS) 3 MG TABS, Take 1 tablet (3 mg total) by mouth daily., Disp: 30 tablet, Rfl: 4   spironolactone (ALDACTONE) 25 MG tablet, Take 0.5 tablets (12.5 mg total) by mouth daily., Disp: 45 tablet, Rfl: 3   Tafamidis (VYNDAMAX) 61 MG CAPS, Take 1 capsule by mouth daily., Disp: 30 capsule, Rfl: 11   torsemide (DEMADEX) 20 MG tablet, Take 1 tablet (20 mg total) by mouth every other day., Disp: 45 tablet, Rfl: 3 Allergies  Allergen Reactions   Bee Venom Anaphylaxis, Swelling and Other (See Comments)     Swells all over     Social History   Socioeconomic History   Marital status: Single    Spouse name: Not on file   Number of children: Not on file   Years of education: Not on file   Highest education level: Not on file  Occupational History   Not on file  Tobacco Use   Smoking status: Former    Current packs/day: 0.00    Types: Cigarettes    Quit date: 10/06/2017    Years since quitting: 5.8   Smokeless tobacco: Never  Substance and Sexual Activity   Alcohol use: Yes    Comment: Occasional Beer   Drug use: Yes    Types: Marijuana   Sexual activity: Not on file  Other Topics  Concern   Not on file  Social History Narrative   Right Handed.    Lives in a two story home    Lives with 54 year old son.    Social Drivers of Corporate investment banker Strain: Low Risk  (01/18/2022)   Overall Financial Resource Strain (CARDIA)    Difficulty of Paying Living Expenses: Not hard at all  Food Insecurity: No Food Insecurity (09/21/2022)   Hunger Vital Sign    Worried About Running Out of Food in the Last Year: Never true    Ran Out of Food in the Last Year: Never true  Transportation Needs: No Transportation Needs (06/04/2022)   PRAPARE - Administrator, Civil Service (Medical): No    Lack of Transportation (Non-Medical): No  Physical Activity: Inactive (01/18/2022)   Exercise Vital Sign    Days of Exercise per Week: 0 days    Minutes of Exercise per Session: 0 min  Stress: Stress Concern Present (01/18/2022)   [employer] of Occupational Health - Occupational Stress Questionnaire    Feeling of Stress : To some extent  Social Connections: Socially Isolated (01/18/2022)   Social Connection and Isolation Panel [NHANES]    Frequency of Communication with Friends and Family: More than three times a week    Frequency of Social Gatherings with Friends and Family: More than three times a week    Attends Religious Services: Never    Database administrator or  Organizations: No    Attends Banker Meetings: Never    Marital Status: Never married  Intimate Partner Violence: Not At Risk (01/18/2022)   Humiliation, Afraid, Rape, and Kick questionnaire    Fear of Current or Ex-Partner: No    Emotionally Abused: No    Physically Abused: No    Sexually Abused: No    Physical Exam      Future Appointments  Date Time Provider Department Center  08/18/2023 12:00 PM Terressa Koyanagi, DO [facility]

## 2023-08-04 ENCOUNTER — Other Ambulatory Visit (HOSPITAL_COMMUNITY): Payer: Self-pay

## 2023-08-11 ENCOUNTER — Other Ambulatory Visit (HOSPITAL_COMMUNITY): Payer: Self-pay | Admitting: Emergency Medicine

## 2023-08-11 ENCOUNTER — Other Ambulatory Visit (HOSPITAL_COMMUNITY): Payer: Self-pay

## 2023-08-11 NOTE — Progress Notes (Signed)
 Paramedicine Encounter    Patient ID: Thomas West, male    DOB: 08-15-1960, 63 y.o.   MRN: [id number]   Complaints NONE   Assessment A&O x 4,skin W&D w/ good color.  Denies chest pain or SOB.  Lung sounds clear and equal bilat.  No peripheral edema noted  Compliance with meds  YES  Pill box filled x 2 weeks  Refills needed Hydralazine, Duloxetine, Trajenta, Atorvastatin  Meds changes since last visit NONE    Social changes NONE   BP (!) 150/90 (BP Location: Left Arm, Patient Position: Sitting, Cuff Size: Normal)   Pulse 96   Resp 16   Wt 167 lb 6.4 oz (75.9 kg)   SpO2 98%   BMI 24.72 kg/m  Weight yesterday-not taken Last visit weight-173lb  ATF Mr. Porcaro A&O x 4, skin W&D w/ good color.  He denies chest pain or SOB.  Lung sounds clear and equal bilat.  No peripheral edema noted.  His med compliance was much improved for that past few visits.   Med box reconciled x 2 weeks.  Encouraged pt to stay compliant with all meds.  ACTION: Home visit completed  [name] [phone] 08/11/23  Patient Care Team: [name], [name], MD as PCP - General (Family Medicine) Bensimhon, Bevelyn Buckles, MD as PCP - Cardiology (Cardiology) Glendale Chard, DO as Consulting Physician (Neurology) Sherrill Raring, [facility] (Pharmacist) Sherrill Raring, [facility] (Pharmacist)  Patient Active Problem List   Diagnosis Date Noted   Cardiac amyloidosis [facility]) 05/25/2022   Pruritic rash 05/25/2022   Polyneuropathy associated with underlying disease (HCC) 01/15/2022   Atherosclerosis of aorta (HCC) 01/15/2022   History of CVA (cerebrovascular accident) without residual deficits 12/24/2021   AKI (acute kidney injury) (HCC) 12/13/2021   Diarrhea 12/13/2021   Shortness of breath 12/13/2021   Chest pain 12/13/2021   Nausea and vomiting 12/13/2021   Heart failure (HCC) 12/08/2021   Hypertension associated with diabetes (HCC) 07/12/2021   Chronic kidney disease, stage 3a (HCC) 07/12/2021    Hyperkalemia 07/12/2021   CHF exacerbation (HCC) 04/20/2021   HFrEF (heart failure with reduced ejection fraction) (HCC) 02/03/2021   Diabetes mellitus (HCC) 03/27/2019   Hyperlipidemia associated with type 2 diabetes mellitus (HCC) 03/27/2019   CAD (coronary artery disease) 03/05/2019   History of MI (myocardial infarction) 03/05/2019   Type 2 diabetes mellitus with diabetic neuropathy, unspecified (HCC) 03/05/2019   HLD (hyperlipidemia) 03/05/2019   GERD (gastroesophageal reflux disease) 03/05/2019   Hypertension, essential, benign 03/05/2019    Current Outpatient Medications:    albuterol (VENTOLIN HFA) 108 (90 Base) MCG/ACT inhaler, Inhale 1-2 puffs into the lungs every 6 (six) hours for 1 week, then as needed., Disp: 6.7 g, Rfl: 0   aspirin EC 81 MG tablet, Take 1 tablet (81 mg total) by mouth daily. Swallow whole., Disp: 30 tablet, Rfl: 11   atorvastatin (LIPITOR) 80 MG tablet, Take 1 tablet (80 mg total) by mouth daily., Disp: 90 tablet, Rfl: 3   carvedilol (COREG) 12.5 MG tablet, Take 1 tablet (12.5 mg total) by mouth 2 (two) times daily., Disp: 180 tablet, Rfl: 3   clopidogrel (PLAVIX) 75 MG tablet, Take 1 tablet (75 mg total) by mouth daily., Disp: 90 tablet, Rfl: 2   cyanocobalamin (VITAMIN B12) 1000 MCG tablet, Take 1 tablet (1,000 mcg total) by mouth daily., Disp: 130 tablet, Rfl: 3   DULoxetine (CYMBALTA) 30 MG capsule, Take 1 capsule (30 mg total) by mouth daily., Disp: 90 capsule, Rfl: 2  empagliflozin (JARDIANCE) 25 MG TABS tablet, Take 1 tablet (25 mg total) by mouth daily before breakfast., Disp: 90 tablet, Rfl: 2   ezetimibe (ZETIA) 10 MG tablet, Take 1 tablet (10 mg total) by mouth daily., Disp: 90 tablet, Rfl: 1   hydrALAZINE (APRESOLINE) 50 MG tablet, Take 2 tablets (100 mg total) by mouth 3 (three) times daily., Disp: 180 tablet, Rfl: 11   hydrocortisone cream 1 %, Apply 1 Application topically 2 (two) times daily as needed for itching., Disp: 45 g, Rfl: 1    isosorbide mononitrate (IMDUR) 60 MG 24 hr tablet, Take 1.5 tablets (90 mg total) by mouth daily., Disp: 45 tablet, Rfl: 6   linagliptin (TRADJENTA) 5 MG TABS tablet, Take 1 tablet (5 mg total) by mouth daily., Disp: 30 tablet, Rfl: 3   sacubitril-valsartan (ENTRESTO) 97-103 MG, Take 1 tablet by mouth 2 (two) times daily., Disp: 60 tablet, Rfl: 3   Semaglutide (RYBELSUS) 3 MG TABS, Take 1 tablet (3 mg total) by mouth daily., Disp: 30 tablet, Rfl: 4   spironolactone (ALDACTONE) 25 MG tablet, Take 0.5 tablets (12.5 mg total) by mouth daily., Disp: 45 tablet, Rfl: 3   Tafamidis (VYNDAMAX) 61 MG CAPS, Take 1 capsule by mouth daily., Disp: 30 capsule, Rfl: 11   torsemide (DEMADEX) 20 MG tablet, Take 1 tablet (20 mg total) by mouth every other day., Disp: 45 tablet, Rfl: 3 Allergies  Allergen Reactions   Bee Venom Anaphylaxis, Swelling and Other (See Comments)    Swells all over     Social History   Socioeconomic History   Marital status: Single    Spouse name: Not on file   Number of children: Not on file   Years of education: Not on file   Highest education level: Not on file  Occupational History   Not on file  Tobacco Use   Smoking status: Former    Current packs/day: 0.00    Types: Cigarettes    Quit date: 10/06/2017    Years since quitting: 5.8   Smokeless tobacco: Never  Substance and Sexual Activity   Alcohol use: Yes    Comment: Occasional Beer   Drug use: Yes    Types: Marijuana   Sexual activity: Not on file  Other Topics Concern   Not on file  Social History Narrative   Right Handed.    Lives in a two story home    Lives with 25 year old son.    Social Drivers of Corporate investment banker Strain: Low Risk  (01/18/2022)   Overall Financial Resource Strain (CARDIA)    Difficulty of Paying Living Expenses: Not hard at all  Food Insecurity: No Food Insecurity (09/21/2022)   Hunger Vital Sign    Worried About Running Out of Food in the Last Year: Never true    Ran  Out of Food in the Last Year: Never true  Transportation Needs: No Transportation Needs (06/04/2022)   PRAPARE - Administrator, Civil Service (Medical): No    Lack of Transportation (Non-Medical): No  Physical Activity: Inactive (01/18/2022)   Exercise Vital Sign    Days of Exercise per Week: 0 days    Minutes of Exercise per Session: 0 min  Stress: Stress Concern Present (01/18/2022)   [employer] of Occupational Health - Occupational Stress Questionnaire    Feeling of Stress : To some extent  Social Connections: Socially Isolated (01/18/2022)   Social Connection and Isolation Panel [NHANES]    Frequency of  Communication with Friends and Family: More than three times a week    Frequency of Social Gatherings with Friends and Family: More than three times a week    Attends Religious Services: Never    Database administrator or Organizations: No    Attends Banker Meetings: Never    Marital Status: Never married  Intimate Partner Violence: Not At Risk (01/18/2022)   Humiliation, Afraid, Rape, and Kick questionnaire    Fear of Current or Ex-Partner: No    Emotionally Abused: No    Physically Abused: No    Sexually Abused: No    Physical Exam      Future Appointments  Date Time Provider Department Center  08/18/2023 12:00 PM Terressa Koyanagi, DO [facility]

## 2023-08-16 ENCOUNTER — Other Ambulatory Visit: Payer: Self-pay

## 2023-08-16 ENCOUNTER — Other Ambulatory Visit (HOSPITAL_COMMUNITY): Payer: Self-pay

## 2023-08-16 NOTE — Progress Notes (Signed)
 Specialty Pharmacy Refill Coordination Note  Thomas West is a 63 y.o. male contacted today regarding refills of specialty medication(s) Tafamidis [name])  Spoke with [name]  Patient requested Delivery   Delivery date: 08/19/23   Verified address: [address] [place], [place] [address]   Medication will be filled on 03.27.25.

## 2023-08-18 ENCOUNTER — Ambulatory Visit: Payer: No Typology Code available for payment source | Admitting: Family Medicine

## 2023-08-18 ENCOUNTER — Other Ambulatory Visit (HOSPITAL_COMMUNITY): Payer: Self-pay | Admitting: Emergency Medicine

## 2023-08-18 DIAGNOSIS — Z0000 Encounter for general adult medical examination without abnormal findings: Secondary | ICD-10-CM | POA: Diagnosis not present

## 2023-08-18 DIAGNOSIS — E1140 Type 2 diabetes mellitus with diabetic neuropathy, unspecified: Secondary | ICD-10-CM | POA: Diagnosis not present

## 2023-08-18 DIAGNOSIS — Z1211 Encounter for screening for malignant neoplasm of colon: Secondary | ICD-10-CM

## 2023-08-18 IMAGING — NM NM SCAN TUMOR LOCALIZE WITH SPECT
2 series · 12 of 12 positions shown · non-contrast
Comparison: none

CLINICAL DATA: HEART FAILURE. CONCERN FOR CARDIAC AMYLOIDOSIS.
History type II diabetes mellitus, hypertension, stage III A chronic
kidney disease, hyperlipidemia, CHF, coronary artery disease post MI

EXAM:
NUCLEAR MEDICINE TUMOR LOCALIZATION. PYP CARDIAC AMYLOIDOSIS SCAN
WITH SPECT
TECHNIQUE: Following intravenous administration of radiopharmaceutical,
anterior planar images of the chest were obtained. Regions of
interest were placed on the heart and contralateral chest wall for
quantitative assessment. Additional SPECT imaging of the chest was
obtained.
RADIOPHARMACEUTICALS:  19.45 mCi TECHNETIUM 99 PYROPHOSPHATE

[Series 1: spect - (id)_(id)_cor · 4.1mm · 4.14mm/px · 6 of 128 frames shown]
[frame 11/128]
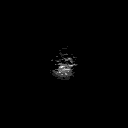
[frame 32/128]
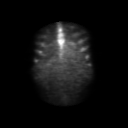
[frame 54/128]
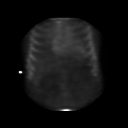
[frame 75/128]
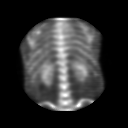
[frame 96/128]
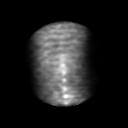
[frame 118/128]
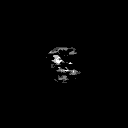

[Series 1: spect - (id)_(id)_tra · 4.1mm · 4.14mm/px · 6 of 128 frames shown]
[frame 11/128]
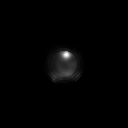
[frame 32/128]
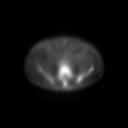
[frame 54/128]
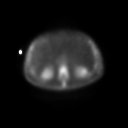
[frame 75/128]
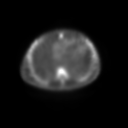
[frame 96/128]
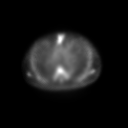
[frame 118/128]
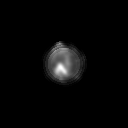

[12 of 12 positions shown; findings below may reference images not displayed]

FINDINGS: Planar Visual assessment:

Anterior planar imaging demonstrates radiotracer uptake within the
heart equal to uptake within the adjacent ribs (Grade 2).

Quantitative assessment :

Quantitative assessment of the cardiac uptake compared to the
contralateral chest wall is equal to 1.11 (H/CL = 1.11).

SPECT assessment: SPECT imaging of the chest demonstrates mild
radiotracer accumulation within the LEFT ventricle.
IMPRESSION: Visual and quantitative assessment (grade 2, H/CLL equal 1.11) are
strongly suggestive of transthyretin amyloidosis.

## 2023-08-18 NOTE — Progress Notes (Signed)
 PATIENT CHECK-IN and HEALTH RISK ASSESSMENT QUESTIONNAIRE:  -completed by phone/video for upcoming Medicare Preventive Visit  Pre-Visit Check-in: 1)Vitals (height, wt, BP, etc) - record in vitals section for visit on day of visit Request home vitals (wt, BP, etc.) and enter into vitals, THEN update Vital Signs SmartPhrase below at the top of the HPI. See below.  2)Review and Update Medications, Allergies PMH, Surgeries, Social history in Epic 3)Hospitalizations in the last year with date/reason? n  4)Review and Update Care Team (patient's specialists) in Epic 5) Complete PHQ9 in Epic  6) Complete Fall Screening in Epic 7)Review all Health Maintenance Due and order under PCP if not done.  Medicare Wellness Patient Questionnaire:  Answer theses question about your habits: How often do you have a drink containing alcohol? Once or twice a month How many drinks containing alcohol do you have on a typical day when you are drinking? 1-2 Have you ever smoked? no Do you use smokeless tobacco?n Do you use an illicit drugs?n On average, how many days per week do you engage in moderate to strenuous exercise (like a brisk walk)? Goes fishing, some walking 3x per week for about 30 minutes. Typical breakfast: eggs, pancake, oatmeal Typical lunch: veggies, fish, salads Typical dinner: veggies, baked Typical snacks: grapes, pineapple, oranges  Beverages:  water, juice  Answer theses question about your everyday activities: Can you perform most household chores? y Are you deaf or have significant trouble hearing?n Do you feel that you have a problem with memory?n Do you feel safe at home?y Last dentist visit?he is looking for a dentist currently 8. Do you have any difficulty performing your everyday activities?n Are you having any difficulty walking, taking medications on your own, and or difficulty managing daily home needs?n Do you have difficulty walking or climbing stairs?n Do you have  difficulty dressing or bathing?n Do you have difficulty doing errands alone such as visiting a doctor's office or shopping?n Do you currently have any difficulty preparing food and eating?n Do you currently have any difficulty using the toilet?n Do you have any difficulty managing your finances?n Do you have any difficulties with housekeeping of managing your housekeeping?n   Do you have Advanced Directives in place (Living Will, Healthcare Power or Attorney)? He thinks so   Last eye Exam and location? Has not see eye doc in a few years   Do you currently use prescribed or non-prescribed narcotic or opioid pain medications?n0   ----------------------------------------------------------------------------------------------------------------------------------------------------------------------------------------------------------------------  Because this visit was a virtual/telehealth visit, some criteria may be missing or patient reported. Any vitals not documented were not able to be obtained and vitals that have been documented are patient reported.    MEDICARE ANNUAL PREVENTIVE CARE VISIT WITH PROVIDER (Welcome to Medicare, initial annual wellness or annual wellness exam)  Virtual Visit via Phone Note  I connected with Thomas West on 08/18/23  by phone and verified that I am speaking with the correct person using two identifiers.  Location patient: car in [place] Location provider:work or home office Persons participating in the virtual visit: patient, provider  Concerns and/or follow up today: no new concerns  Thomas West  has a past medical history of CHF (congestive heart failure) (HCC), Coronary artery disease, Diabetes mellitus without complication (HCC), History of MI (myocardial infarction) (03/05/2019), Hypertension, and Stroke (HCC).  See HM section in Epic for other details of completed HM.    ROS: negative for report of fevers, unintentional weight loss, vision changes,  vision loss, hearing loss or change,  chest pain, sob, hemoptysis, melena, hematochezia, hematuria, falls, bleeding or bruising, thoughts of suicide or self harm, memory loss  Patient-completed extensive health risk assessment - reviewed and discussed with the patient: See Health Risk Assessment completed with patient prior to the visit either above or in recent phone note. This was reviewed in detailed with the patient today and appropriate recommendations, orders and referrals were placed as needed per Summary below and patient instructions.   Review of Medical History: -PMH, PSH, Family History and current specialty and care providers reviewed and updated and listed below   Patient Care Team: [name], [name], MD as PCP - General (Family Medicine) Bensimhon, Bevelyn Buckles, MD as PCP - Cardiology (Cardiology) Glendale Chard, DO as Consulting Physician (Neurology) Sherrill Raring, [facility] (Pharmacist) Sherrill Raring, [facility] (Pharmacist)   Past Medical History:  Diagnosis Date   CHF (congestive heart failure) (HCC)    Coronary artery disease    Diabetes mellitus without complication (HCC)    History of MI (myocardial infarction) 03/05/2019   Hypertension    Stroke [facility])     Past Surgical History:  Procedure Laterality Date   leg surgery     "pins in left shin"   RIGHT/LEFT HEART CATH AND CORONARY ANGIOGRAPHY N/A 03/11/2021   Procedure: RIGHT/LEFT HEART CATH AND CORONARY ANGIOGRAPHY;  Surgeon: Orbie Pyo, MD;  Location: [facility] INVASIVE CV LAB;  Service: Cardiovascular;  Laterality: N/A;    Social History   Socioeconomic History   Marital status: Single    Spouse name: Not on file   Number of children: Not on file   Years of education: Not on file   Highest education level: Not on file  Occupational History   Not on file  Tobacco Use   Smoking status: Former    Current packs/day: 0.00    Types: Cigarettes    Quit date: 10/06/2017    Years since quitting: 5.8   Smokeless  tobacco: Never  Substance and Sexual Activity   Alcohol use: Yes    Comment: Occasional Beer   Drug use: Yes    Types: Marijuana   Sexual activity: Not on file  Other Topics Concern   Not on file  Social History Narrative   Right Handed.    Lives in a two story home    Lives with 56 year old son.    Social Drivers of Corporate investment banker Strain: Low Risk  (01/18/2022)   Overall Financial Resource Strain (CARDIA)    Difficulty of Paying Living Expenses: Not hard at all  Food Insecurity: No Food Insecurity (09/21/2022)   Hunger Vital Sign    Worried About Running Out of Food in the Last Year: Never true    Ran Out of Food in the Last Year: Never true  Transportation Needs: No Transportation Needs (06/04/2022)   PRAPARE - Administrator, Civil Service (Medical): No    Lack of Transportation (Non-Medical): No  Physical Activity: Inactive (01/18/2022)   Exercise Vital Sign    Days of Exercise per Week: 0 days    Minutes of Exercise per Session: 0 min  Stress: Stress Concern Present (01/18/2022)   [employer] of Occupational Health - Occupational Stress Questionnaire    Feeling of Stress : To some extent  Social Connections: Socially Isolated (01/18/2022)   Social Connection and Isolation Panel [NHANES]    Frequency of Communication with Friends and Family: More than three times a week    Frequency of  Social Gatherings with Friends and Family: More than three times a week    Attends Religious Services: Never    Database administrator or Organizations: No    Attends Banker Meetings: Never    Marital Status: Never married  Intimate Partner Violence: Not At Risk (01/18/2022)   Humiliation, Afraid, Rape, and Kick questionnaire    Fear of Current or Ex-Partner: No    Emotionally Abused: No    Physically Abused: No    Sexually Abused: No    Family History  Problem Relation Age of Onset   Diabetes Mother    Kidney failure Mother     Current  Outpatient Medications on File Prior to Visit  Medication Sig Dispense Refill   albuterol (VENTOLIN HFA) 108 (90 Base) MCG/ACT inhaler Inhale 1-2 puffs into the lungs every 6 (six) hours for 1 week, then as needed. 6.7 g 0   aspirin EC 81 MG tablet Take 1 tablet (81 mg total) by mouth daily. Swallow whole. 30 tablet 11   atorvastatin (LIPITOR) 80 MG tablet Take 1 tablet (80 mg total) by mouth daily. 90 tablet 3   carvedilol (COREG) 12.5 MG tablet Take 1 tablet (12.5 mg total) by mouth 2 (two) times daily. 180 tablet 3   clopidogrel (PLAVIX) 75 MG tablet Take 1 tablet (75 mg total) by mouth daily. 90 tablet 2   cyanocobalamin (VITAMIN B12) 1000 MCG tablet Take 1 tablet (1,000 mcg total) by mouth daily. 130 tablet 3   DULoxetine (CYMBALTA) 30 MG capsule Take 1 capsule (30 mg total) by mouth daily. 90 capsule 2   empagliflozin (JARDIANCE) 25 MG TABS tablet Take 1 tablet (25 mg total) by mouth daily before breakfast. 90 tablet 2   ezetimibe (ZETIA) 10 MG tablet Take 1 tablet (10 mg total) by mouth daily. 90 tablet 1   hydrALAZINE (APRESOLINE) 50 MG tablet Take 2 tablets (100 mg total) by mouth 3 (three) times daily. 180 tablet 11   hydrocortisone cream 1 % Apply 1 Application topically 2 (two) times daily as needed for itching. 45 g 1   isosorbide mononitrate (IMDUR) 60 MG 24 hr tablet Take 1.5 tablets (90 mg total) by mouth daily. 45 tablet 6   linagliptin (TRADJENTA) 5 MG TABS tablet Take 1 tablet (5 mg total) by mouth daily. 30 tablet 3   sacubitril-valsartan (ENTRESTO) 97-103 MG Take 1 tablet by mouth 2 (two) times daily. 60 tablet 3   Semaglutide (RYBELSUS) 3 MG TABS Take 1 tablet (3 mg total) by mouth daily. 30 tablet 4   spironolactone (ALDACTONE) 25 MG tablet Take 0.5 tablets (12.5 mg total) by mouth daily. 45 tablet 3   Tafamidis (VYNDAMAX) 61 MG CAPS Take 1 capsule by mouth daily. 30 capsule 11   torsemide (DEMADEX) 20 MG tablet Take 1 tablet (20 mg total) by mouth every other day. 45 tablet  3   No current facility-administered medications on file prior to visit.    Allergies  Allergen Reactions   Bee Venom Anaphylaxis, Swelling and Other (See Comments)    Swells all over       Physical Exam Vitals requested from patient and listed below if patient had equipment and was able to obtain at home for this virtual visit: There were no vitals filed for this visit. Estimated body mass index is 24.72 kg/m as calculated from the following:   Height as of 05/27/23: 5\' 9"  (1.753 m).   Weight as of 08/11/23: 167 lb 6.4 oz (  75.9 kg).  EKG (optional): deferred due to virtual visit  GENERAL: alert, oriented, no acute distress detected; full vision exam deferred due to pandemic and/or virtual encounter  PSYCH/NEURO: pleasant and cooperative, no obvious depression or anxiety, speech and thought processing grossly intact, Cognitive function grossly intact  Flowsheet Row Office Visit from 05/25/2022 in [facility]  PHQ-9 Total Score 1           08/18/2023   12:15 PM 05/11/2023    1:02 PM 09/24/2022   10:53 AM 06/29/2022    9:07 AM 05/25/2022   11:26 AM  Depression screen PHQ 2/9  Decreased Interest 0 0 0 0 0  Down, Depressed, Hopeless 0 0 0 0 0  PHQ - 2 Score 0 0 0 0 0  Altered sleeping     1  Tired, decreased energy     0  Change in appetite     0  Feeling bad or failure about yourself      0  Trouble concentrating     0  Moving slowly or fidgety/restless     0  Suicidal thoughts     0  PHQ-9 Score     1  Difficult doing work/chores     Not difficult at all       05/31/2022    9:32 PM 06/14/2022   11:26 AM 06/29/2022    9:07 AM 09/24/2022   10:53 AM 08/18/2023   12:15 PM  Fall Risk  Falls in the past year?  1 0 0 0  Was there an injury with Fall?  1  0 0  Fall Risk Category Calculator  2  0 0  (RETIRED) Patient Fall Risk Level Low fall risk      Patient at Risk for Falls Due to    History of fall(s)   Fall risk Follow up  Falls evaluation  completed  Falls evaluation completed Falls evaluation completed     SUMMARY AND PLAN:  Encounter for Medicare annual wellness exam  Type 2 diabetes mellitus with diabetic neuropathy, without long-term current use of insulin (HCC) - Plan: Ambulatory referral to Ophthalmology  Colon cancer screening - Plan: Cologuard  Discussed applicable health maintenance/preventive health measures and advised and referred or ordered per patient preferences: -referred for eye exam per his preference -discussed options risks/benefits for colon cancer screening and he wants to do the cologuard, sent order and put note in order to ensure covered by his insurance, he says he will do in office exam if not -discussed vaccines due and he believes he has had all of them, he agrees to check with his sister who he says keeps track of that, asked that he bring proof of receipt if possible so that we can update record Health Maintenance  Topic Date Due   OPHTHALMOLOGY EXAM  Never done   Colonoscopy  Never done   COVID-19 Vaccine (1 - 2024-25 season) Never done   INFLUENZA VACCINE  08/22/2023 (Originally 12/23/2022)   Zoster Vaccines- Shingrix (1 of 2) 11/18/2023 (Originally 04/01/2013)   Pneumococcal Vaccine 46-68 Years old (1 of 2 - PCV) 08/17/2024 (Originally 04/01/1967)   Diabetic kidney evaluation - Urine ACR  09/24/2023   FOOT EXAM  09/24/2023   HEMOGLOBIN A1C  11/09/2023   Diabetic kidney evaluation - eGFR measurement  06/22/2024   Medicare Annual Wellness (AWV)  08/17/2024   Hepatitis C Screening  Completed   HIV Screening  Completed   HPV VACCINES  Aged  Out   DTaP/Tdap/Td  Discontinued     Education and counseling on the following was provided based on the above review of health and a plan/checklist for the patient, along with additional information discussed, was provided for the patient in the patient instructions :  -Provided counseling and plan for increased risk of falling if applicable per above  screening. Provided safe balance exercises that can be done at home to improve balance and discussed exercise guidelines for adults with include balance exercises at least 3 days per week.  -Advised and counseled on a healthy lifestyle - including the importance of a healthy diet, regular physical activity, social connections and stress management. -Reviewed patient's current diet. Advised and counseled on a whole foods based healthy diet. A summary of a healthy diet was provided in the Patient Instructions.  -reviewed patient's current physical activity level and discussed exercise guidelines for adults. Discussed community resources and ideas for safe exercise at home to assist in meeting exercise guideline recommendations in a safe and healthy way.  -Advise yearly dental visits at minimum and regular eye exams   Follow up: see patient instructions   Patient Instructions  I really enjoyed getting to talk with you today! I am available on Tuesdays and Thursdays for virtual visits if you have any questions or concerns, or if I can be of any further assistance.   CHECKLIST FROM ANNUAL WELLNESS VISIT:  -Follow up (please call to schedule if not scheduled after visit):   -yearly for annual wellness visit with primary care office  Here is a list of your preventive care/health maintenance measures and the plan for each if any are due:  PLAN For any measures below that may be due:  -we sent referral for the eye exam, please call our office if you have not been contacted in the next 1-2 weeks -we sent referral for the cologuard test for colon cancer screening, please call our office if you have not received the test in the next few weeks. Please complete promptly and resend following instructions once you receive it.   Health Maintenance  Topic Date Due   Pneumococcal Vaccine 79-94 Years old (1 of 2 - PCV) Never done   OPHTHALMOLOGY EXAM  Never done   Colonoscopy  Never done   Zoster Vaccines-  Shingrix (1 of 2) Never done   Medicare Annual Wellness (AWV)  01/19/2023   COVID-19 Vaccine (1 - 2024-25 season) Never done   INFLUENZA VACCINE  08/22/2023 (Originally 12/23/2022)   Diabetic kidney evaluation - Urine ACR  09/24/2023   FOOT EXAM  09/24/2023   HEMOGLOBIN A1C  11/09/2023   Diabetic kidney evaluation - eGFR measurement  06/22/2024   Hepatitis C Screening  Completed   HIV Screening  Completed   HPV VACCINES  Aged Out   DTaP/Tdap/Td  Discontinued    -See a dentist at least yearly  -Get your eyes checked and then per your eye specialist's recommendations  -Other issues addressed today:   -I have included below further information regarding a healthy whole foods based diet, physical activity guidelines for adults, stress management and opportunities for social connections. I hope you find this information useful.   -----------------------------------------------------------------------------------------------------------------------------------------------------------------------------------------------------------------------------------------------------------    NUTRITION: -eat real food: lots of colorful vegetables (half the plate) and fruits -5-7 servings of vegetables and fruits per day (fresh or steamed is best), exp. 2 servings of vegetables with lunch and dinner and 2 servings of fruit per day. Berries and greens such as kale and  collards are great choices.  -consume on a regular basis:  fresh fruits, fresh veggies, fish, nuts, seeds, healthy oils (such as olive oil, avocado oil), whole grains (make sure for bread/pasta/crackers/etc., that the first ingredient on label contains the word "whole"), legumes. -can eat small amounts of dairy and lean meat (no larger than the palm of your hand), but avoid processed meats such as ham, bacon, lunch meat, etc. -drink water -try to avoid fast food and pre-packaged foods, processed meat, ultra processed foods/beverages (donuts,  candy, etc.) -most experts advise limiting sodium to < 2300mg  per day, should limit further is any chronic conditions such as high blood pressure, heart disease, diabetes, etc. The American Heart Association advised that < 1500mg  is is ideal -try to avoid foods/beverages that contain any ingredients with names you do not recognize  -try to avoid foods/beverages  with added sugar or sweeteners/sweets  -try to avoid sweet drinks (including diet drinks): soda, juice, Gatorade, sweet tea, power drinks, diet drinks -try to avoid white rice, white bread, pasta (unless whole grain)  EXERCISE GUIDELINES FOR ADULTS: -if you wish to increase your physical activity, do so gradually and with the approval of your doctor -STOP and seek medical care immediately if you have any chest pain, chest discomfort or trouble breathing when starting or increasing exercise  -move and stretch your body, legs, feet and arms when sitting for long periods -Physical activity guidelines for optimal health in adults: -get at least 150 minutes per week of moderate exercise (can talk, but not sing); this is about 20-30 minutes of sustained activity 5-7 days per week or two 10-15 minute episodes of sustained activity 5-7 days per week -do some muscle building/resistance training/strength training at least 2 days per week  -balance exercises 3+ days per week:   Stand somewhere where you have something sturdy to hold onto if you lose balance    1) lift up on toes, then back down, start with 5x per day and work up to 20x   2) stand and lift one leg straight out to the side so that foot is a few inches of the floor, start with 5x each side and work up to 20x each side   3) stand on one foot, start with 5 seconds each side and work up to 20 seconds on each side  If you need ideas or help with getting more active:  -Silver sneakers https://tools.silversneakers.com  -Walk with a Doc: http://www.duncan-williams.com/  -try to include  resistance (weight lifting/strength building) and balance exercises twice per week: or the following link for ideas: http://castillo-powell.com/  BuyDucts.dk  STRESS MANAGEMENT: -can try meditating, or just sitting quietly with deep breathing while intentionally relaxing all parts of your body for 5 minutes daily -if you need further help with stress, anxiety or depression please follow up with your primary doctor or contact the wonderful folks at [facility]: [phone]  SOCIAL CONNECTIONS: -options in [place] if you wish to engage in more social and exercise related activities:  -Silver sneakers https://tools.silversneakers.com  -Walk with a Doc: http://www.duncan-williams.com/  -Check out the [facility] Active Adults 50+ section on the [place] of [employer] (hiking clubs, book clubs, cards and games, chess, exercise classes, aquatic classes and much more) - see the website for details: https://www.[place].gov/departments/parks-recreation/active-adults50  -YouTube has lots of exercise videos for different ages and abilities as well  -[facility] (a variety of indoor and outdoor inperson activities for adults). [phone]. [address].  -Arboriculturist Classes (  a variety of topics): see seniorplanet.org or call [phone]  -consider volunteering at a school, hospice center, church, senior center or elsewhere            Terressa Koyanagi, DO

## 2023-08-18 NOTE — Progress Notes (Signed)
 Paramedicine Encounter    Patient ID: Thomas West, male    DOB: May 14, 1961, 63 y.o.   MRN: [id number]   Complaints NONE  Assessment A&O x 4, skin W&D w/ good color. Denies chest pain or SOB.  Lung sounds clear and equal bilat.  No peripheral edema noted.  Compliance with meds YES  Pill box filled x 2 weeks  Refills needed NONE  Meds changes since last visit NONE    Social changes NONE   BP (!) 140/80 (BP Location: Left Arm, Patient Position: Sitting, Cuff Size: Normal)   Pulse 81   Wt 169 lb 12.8 oz (77 kg)   SpO2 97%   BMI 25.08 kg/m  Weight yesterday- not taken Last visit weight-167lb  Mr. Delfavero has done excellent with his med compliance this week.  He reports he feels good.  Denies chest pain or SOB.  Lung sounds clear and equal bilat and no peripheral edema noted.  Encouraged him to maintain his med compliance.  Meds reconciled x 2 weeks.   Next home visit 4/9 @ 8:30.  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   Pruritic rash 05/25/2022   Polyneuropathy associated with underlying disease (HCC) 01/15/2022   Atherosclerosis of aorta (HCC) 01/15/2022   History of CVA (cerebrovascular accident) without residual deficits 12/24/2021   AKI (acute kidney injury) (HCC) 12/13/2021   Diarrhea 12/13/2021   Shortness of breath 12/13/2021   Chest pain 12/13/2021   Nausea and vomiting 12/13/2021   Heart failure (HCC) 12/08/2021   Hypertension associated with diabetes (HCC) 07/12/2021   Chronic kidney disease, stage 3a (HCC) 07/12/2021   Hyperkalemia 07/12/2021   CHF exacerbation (HCC)  04/20/2021   HFrEF (heart failure with reduced ejection fraction) (HCC) 02/03/2021   Diabetes mellitus (HCC) 03/27/2019   Hyperlipidemia associated with type 2 diabetes mellitus (HCC) 03/27/2019   CAD (coronary artery disease) 03/05/2019   History of MI (myocardial infarction) 03/05/2019   Type 2 diabetes mellitus with diabetic neuropathy, unspecified (HCC) 03/05/2019   HLD (hyperlipidemia) 03/05/2019   GERD (gastroesophageal reflux disease) 03/05/2019   Hypertension, essential, benign 03/05/2019    Current Outpatient Medications:    albuterol (VENTOLIN HFA) 108 (90 Base) MCG/ACT inhaler, Inhale 1-2 puffs into the lungs every 6 (six) hours for 1 week, then as needed., Disp: 6.7 g, Rfl: 0   atorvastatin (LIPITOR) 80 MG tablet, Take 1 tablet (80 mg total) by mouth daily., Disp: 90 tablet, Rfl: 3   carvedilol (COREG) 12.5 MG tablet, Take 1 tablet (12.5 mg total) by mouth 2 (two) times daily., Disp: 180 tablet, Rfl: 3   clopidogrel (PLAVIX) 75 MG tablet, Take 1 tablet (75 mg total) by mouth daily., Disp: 90 tablet, Rfl: 2   cyanocobalamin (VITAMIN B12) 1000 MCG tablet, Take 1 tablet (1,000 mcg total) by mouth daily., Disp: 130 tablet, Rfl: 3   DULoxetine (CYMBALTA) 30 MG capsule, Take 1 capsule (30 mg total) by mouth daily., Disp: 90 capsule, Rfl: 2   empagliflozin (JARDIANCE) 25 MG TABS tablet, Take 1 tablet (25 mg total) by mouth daily before breakfast., Disp: 90 tablet, Rfl: 2   ezetimibe (ZETIA) 10 MG tablet, Take 1  tablet (10 mg total) by mouth daily., Disp: 90 tablet, Rfl: 1   hydrALAZINE (APRESOLINE) 50 MG tablet, Take 2 tablets (100 mg total) by mouth 3 (three) times daily., Disp: 180 tablet, Rfl: 11   hydrocortisone cream 1 %, Apply 1 Application topically 2 (two) times daily as needed for itching., Disp: 45 g, Rfl: 1   isosorbide mononitrate (IMDUR) 60 MG 24 hr tablet, Take 1.5 tablets (90 mg total) by mouth daily., Disp: 45 tablet, Rfl: 6   linagliptin (TRADJENTA) 5 MG TABS tablet,  Take 1 tablet (5 mg total) by mouth daily., Disp: 30 tablet, Rfl: 3   sacubitril-valsartan (ENTRESTO) 97-103 MG, Take 1 tablet by mouth 2 (two) times daily., Disp: 60 tablet, Rfl: 3   Semaglutide (RYBELSUS) 3 MG TABS, Take 1 tablet (3 mg total) by mouth daily., Disp: 30 tablet, Rfl: 4   spironolactone (ALDACTONE) 25 MG tablet, Take 0.5 tablets (12.5 mg total) by mouth daily., Disp: 45 tablet, Rfl: 3   Tafamidis (VYNDAMAX) 61 MG CAPS, Take 1 capsule by mouth daily., Disp: 30 capsule, Rfl: 11   torsemide (DEMADEX) 20 MG tablet, Take 1 tablet (20 mg total) by mouth every other day., Disp: 45 tablet, Rfl: 3   aspirin EC 81 MG tablet, Take 1 tablet (81 mg total) by mouth daily. Swallow whole. (Patient not taking: Reported on 08/18/2023), Disp: 30 tablet, Rfl: 11 Allergies  Allergen Reactions   Bee Venom Anaphylaxis, Swelling and Other (See Comments)    Swells all over     Social History   Socioeconomic History   Marital status: Single    Spouse name: Not on file   Number of children: Not on file   Years of education: Not on file   Highest education level: Not on file  Occupational History   Not on file  Tobacco Use   Smoking status: Former    Current packs/day: 0.00    Types: Cigarettes    Quit date: 10/06/2017    Years since quitting: 5.8   Smokeless tobacco: Never  Substance and Sexual Activity   Alcohol use: Yes    Comment: Occasional Beer   Drug use: Yes    Types: Marijuana   Sexual activity: Not on file  Other Topics Concern   Not on file  Social History Narrative   Right Handed.    Lives in a two story home    Lives with 56 year old son.    Social Drivers of Corporate investment banker Strain: Low Risk  (01/18/2022)   Overall Financial Resource Strain (CARDIA)    Difficulty of Paying Living Expenses: Not hard at all  Food Insecurity: No Food Insecurity (09/21/2022)   Hunger Vital Sign    Worried About Running Out of Food in the Last Year: Never true    Ran Out of Food  in the Last Year: Never true  Transportation Needs: No Transportation Needs (06/04/2022)   PRAPARE - Administrator, Civil Service (Medical): No    Lack of Transportation (Non-Medical): No  Physical Activity: Inactive (01/18/2022)   Exercise Vital Sign    Days of Exercise per Week: 0 days    Minutes of Exercise per Session: 0 min  Stress: Stress Concern Present (01/18/2022)   [employer] of Occupational Health - Occupational Stress Questionnaire    Feeling of Stress : To some extent  Social Connections: Socially Isolated (01/18/2022)   Social Connection and Isolation Panel [NHANES]    Frequency of Communication with  Friends and Family: More than three times a week    Frequency of Social Gatherings with Friends and Family: More than three times a week    Attends Religious Services: Never    Database administrator or Organizations: No    Attends Banker Meetings: Never    Marital Status: Never married  Intimate Partner Violence: Not At Risk (01/18/2022)   Humiliation, Afraid, Rape, and Kick questionnaire    Fear of Current or Ex-Partner: No    Emotionally Abused: No    Physically Abused: No    Sexually Abused: No    Physical Exam      No future appointments.

## 2023-08-18 NOTE — Patient Instructions (Signed)
 I really enjoyed getting to talk with you today! I am available on Tuesdays and Thursdays for virtual visits if you have any questions or concerns, or if I can be of any further assistance.   CHECKLIST FROM ANNUAL WELLNESS VISIT:  -Follow up (please call to schedule if not scheduled after visit):   -yearly for annual wellness visit with primary care office  Here is a list of your preventive care/health maintenance measures and the plan for each if any are due:  PLAN For any measures below that may be due:  -we sent referral for the eye exam, please call our office if you have not been contacted in the next 1-2 weeks -we sent referral for the cologuard test for colon cancer screening, please call our office if you have not received the test in the next few weeks. Please complete promptly and resend following instructions once you receive it.   Health Maintenance  Topic Date Due   Pneumococcal Vaccine 53-92 Years old (1 of 2 - PCV) Never done   OPHTHALMOLOGY EXAM  Never done   Colonoscopy  Never done   Zoster Vaccines- Shingrix (1 of 2) Never done   Medicare Annual Wellness (AWV)  01/19/2023   COVID-19 Vaccine (1 - 2024-25 season) Never done   INFLUENZA VACCINE  08/22/2023 (Originally 12/23/2022)   Diabetic kidney evaluation - Urine ACR  09/24/2023   FOOT EXAM  09/24/2023   HEMOGLOBIN A1C  11/09/2023   Diabetic kidney evaluation - eGFR measurement  06/22/2024   Hepatitis C Screening  Completed   HIV Screening  Completed   HPV VACCINES  Aged Out   DTaP/Tdap/Td  Discontinued    -See a dentist at least yearly  -Get your eyes checked and then per your eye specialist's recommendations  -Other issues addressed today:   -I have included below further information regarding a healthy whole foods based diet, physical activity guidelines for adults, stress management and opportunities for social connections. I hope you find this information useful.    -----------------------------------------------------------------------------------------------------------------------------------------------------------------------------------------------------------------------------------------------------------    NUTRITION: -eat real food: lots of colorful vegetables (half the plate) and fruits -5-7 servings of vegetables and fruits per day (fresh or steamed is best), exp. 2 servings of vegetables with lunch and dinner and 2 servings of fruit per day. Berries and greens such as kale and collards are great choices.  -consume on a regular basis:  fresh fruits, fresh veggies, fish, nuts, seeds, healthy oils (such as olive oil, avocado oil), whole grains (make sure for bread/pasta/crackers/etc., that the first ingredient on label contains the word "whole"), legumes. -can eat small amounts of dairy and lean meat (no larger than the palm of your hand), but avoid processed meats such as ham, bacon, lunch meat, etc. -drink water -try to avoid fast food and pre-packaged foods, processed meat, ultra processed foods/beverages (donuts, candy, etc.) -most experts advise limiting sodium to < 2300mg  per day, should limit further is any chronic conditions such as high blood pressure, heart disease, diabetes, etc. The American Heart Association advised that < 1500mg  is is ideal -try to avoid foods/beverages that contain any ingredients with names you do not recognize  -try to avoid foods/beverages  with added sugar or sweeteners/sweets  -try to avoid sweet drinks (including diet drinks): soda, juice, Gatorade, sweet tea, power drinks, diet drinks -try to avoid white rice, white bread, pasta (unless whole grain)  EXERCISE GUIDELINES FOR ADULTS: -if you wish to increase your physical activity, do so gradually and with the approval  of your doctor -STOP and seek medical care immediately if you have any chest pain, chest discomfort or trouble breathing when starting or  increasing exercise  -move and stretch your body, legs, feet and arms when sitting for long periods -Physical activity guidelines for optimal health in adults: -get at least 150 minutes per week of moderate exercise (can talk, but not sing); this is about 20-30 minutes of sustained activity 5-7 days per week or two 10-15 minute episodes of sustained activity 5-7 days per week -do some muscle building/resistance training/strength training at least 2 days per week  -balance exercises 3+ days per week:   Stand somewhere where you have something sturdy to hold onto if you lose balance    1) lift up on toes, then back down, start with 5x per day and work up to 20x   2) stand and lift one leg straight out to the side so that foot is a few inches of the floor, start with 5x each side and work up to 20x each side   3) stand on one foot, start with 5 seconds each side and work up to 20 seconds on each side  If you need ideas or help with getting more active:  -Silver sneakers https://tools.silversneakers.com  -Walk with a Doc: http://www.duncan-williams.com/  -try to include resistance (weight lifting/strength building) and balance exercises twice per week: or the following link for ideas: http://castillo-powell.com/  BuyDucts.dk  STRESS MANAGEMENT: -can try meditating, or just sitting quietly with deep breathing while intentionally relaxing all parts of your body for 5 minutes daily -if you need further help with stress, anxiety or depression please follow up with your primary doctor or contact the wonderful folks at [facility]: [phone]  SOCIAL CONNECTIONS: -options in [place] if you wish to engage in more social and exercise related activities:  -Silver sneakers https://tools.silversneakers.com  -Walk with a Doc: http://www.duncan-williams.com/  -Check out the [facility] Active Adults 50+  section on the [place] [employer] (hiking clubs, book clubs, cards and games, chess, exercise classes, aquatic classes and much more) - see the website for details: https://www.[place].gov/departments/parks-recreation/active-adults50  -YouTube has lots of exercise videos for different ages and abilities as well  -[facility] (a variety of indoor and outdoor inperson activities for adults). [phone]. [address].  -Virtual Online Classes (a variety of topics): see seniorplanet.org or call [phone]  -consider volunteering at a school, hospice center, church, senior center or elsewhere

## 2023-08-31 ENCOUNTER — Other Ambulatory Visit (HOSPITAL_COMMUNITY): Payer: Self-pay | Admitting: Emergency Medicine

## 2023-08-31 ENCOUNTER — Other Ambulatory Visit (HOSPITAL_COMMUNITY): Payer: Self-pay

## 2023-08-31 NOTE — Progress Notes (Unsigned)
 Paramedicine Encounter    Patient ID: Thomas West, male    DOB: 08/26/1960, 63 y.o.   MRN: [id number]   Complaints NONE  Assessment A&O x 4, skin W&D w/ good color.  Denies chest pain or SOB.  Lung sounds clear and equal bilat and no peripheral edema noted.  Compliance with meds YES  Pill box filled x 2 weeks  Refills needed Rybelsus  Meds changes since last visit NONE    Social changes NONE   BP 110/80 (BP Location: Left Arm, Patient Position: Sitting, Cuff Size: Normal)   Pulse 81   Resp 16   Wt 167 lb 12.8 oz (76.1 kg)   SpO2 95%   BMI 24.78 kg/m  Weight yesterday- not taken Last visit weight-169lb   ACTION: Home visit completed  [name] [phone] 08/31/23  Patient Care Team: [name], [name], MD as PCP - General (Family Medicine) Bensimhon, Bevelyn Buckles, MD as PCP - Cardiology (Cardiology) Glendale Chard, DO as Consulting Physician (Neurology) Sherrill Raring, [facility] (Pharmacist) Sherrill Raring, [facility] (Pharmacist)  Patient Active Problem List   Diagnosis Date Noted  . 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 Pruritic rash 05/25/2022  . Polyneuropathy associated with underlying disease (HCC) 01/15/2022  . Atherosclerosis of aorta (HCC) 01/15/2022  . History of CVA (cerebrovascular accident) without residual deficits 12/24/2021  . AKI (acute kidney injury) (HCC) 12/13/2021  . Diarrhea 12/13/2021  . Shortness of breath 12/13/2021  . Chest pain 12/13/2021  . Nausea and vomiting 12/13/2021  . Heart failure (HCC) 12/08/2021  . Hypertension associated with diabetes (HCC) 07/12/2021  . Chronic kidney disease, stage 3a (HCC) 07/12/2021  . Hyperkalemia 07/12/2021  . CHF exacerbation (HCC) 04/20/2021  . HFrEF (heart failure with reduced ejection fraction) (HCC) 02/03/2021  . Diabetes mellitus (HCC) 03/27/2019  . Hyperlipidemia associated with type 2 diabetes mellitus (HCC) 03/27/2019  . CAD (coronary artery disease) 03/05/2019  . History of  MI (myocardial infarction) 03/05/2019  . Type 2 diabetes mellitus with diabetic neuropathy, unspecified (HCC) 03/05/2019  . HLD (hyperlipidemia) 03/05/2019  . GERD (gastroesophageal reflux disease) 03/05/2019  . Hypertension, essential, benign 03/05/2019    Current Outpatient Medications:  .  albuterol (VENTOLIN HFA) 108 (90 Base) MCG/ACT inhaler, Inhale 1-2 puffs into the lungs every 6 (six) hours for 1 week, then as needed., Disp: 6.7 g, Rfl: 0 .  aspirin EC 81 MG tablet, Take 1 tablet (81 mg total) by mouth daily. Swallow whole., Disp: 30 tablet, Rfl: 11 .  atorvastatin (LIPITOR) 80 MG tablet, Take 1 tablet (80 mg total) by mouth daily., Disp: 90 tablet, Rfl: 3 .  carvedilol (COREG) 12.5 MG tablet, Take 1 tablet (12.5 mg total) by mouth 2 (two) times daily., Disp: 180 tablet, Rfl: 3 .  clopidogrel (PLAVIX) 75 MG tablet, Take 1 tablet (75 mg total) by mouth daily., Disp: 90 tablet, Rfl: 2 .  cyanocobalamin (VITAMIN B12) 1000 MCG tablet, Take 1 tablet (1,000 mcg total) by mouth daily., Disp: 130 tablet, Rfl: 3 .  DULoxetine (CYMBALTA) 30 MG capsule, Take 1 capsule (30 mg total) by mouth daily., Disp: 90 capsule, Rfl: 2 .  empagliflozin (JARDIANCE) 25 MG TABS tablet, Take 1 tablet (25 mg total) by mouth daily before breakfast., Disp: 90 tablet, Rfl: 2 .  ezetimibe (ZETIA) 10 MG tablet, Take 1 tablet (10 mg total) by mouth daily., Disp: 90 tablet, Rfl: 1 .  hydrALAZINE (APRESOLINE) 50 MG tablet, Take 2 tablets (100 mg total) by mouth 3 (three) times  daily., Disp: 180 tablet, Rfl: 11 .  hydrocortisone cream 1 %, Apply 1 Application topically 2 (two) times daily as needed for itching., Disp: 45 g, Rfl: 1 .  isosorbide mononitrate (IMDUR) 60 MG 24 hr tablet, Take 1.5 tablets (90 mg total) by mouth daily., Disp: 45 tablet, Rfl: 6 .  linagliptin (TRADJENTA) 5 MG TABS tablet, Take 1 tablet (5 mg total) by mouth daily., Disp: 30 tablet, Rfl: 3 .  sacubitril-valsartan (ENTRESTO) 97-103 MG, Take 1 tablet  by mouth 2 (two) times daily., Disp: 60 tablet, Rfl: 3 .  Semaglutide (RYBELSUS) 3 MG TABS, Take 1 tablet (3 mg total) by mouth daily., Disp: 30 tablet, Rfl: 4 .  spironolactone (ALDACTONE) 25 MG tablet, Take 0.5 tablets (12.5 mg total) by mouth daily., Disp: 45 tablet, Rfl: 3 .  Tafamidis (VYNDAMAX) 61 MG CAPS, Take 1 capsule by mouth daily., Disp: 30 capsule, Rfl: 11 .  torsemide (DEMADEX) 20 MG tablet, Take 1 tablet (20 mg total) by mouth every other day., Disp: 45 tablet, Rfl: 3 Allergies  Allergen Reactions  . Bee Venom Anaphylaxis, Swelling and Other (See Comments)    Swells all over     Social History   Socioeconomic History  . Marital status: Single    Spouse name: Not on file  . Number of children: Not on file  . Years of education: Not on file  . Highest education level: Not on file  Occupational History  . Not on file  Tobacco Use  . Smoking status: Former    Current packs/day: 0.00    Types: Cigarettes    Quit date: 10/06/2017    Years since quitting: 5.9  . Smokeless tobacco: Never  Substance and Sexual Activity  . Alcohol use: Yes    Comment: Occasional Beer  . Drug use: Yes    Types: Marijuana  . Sexual activity: Not on file  Other Topics Concern  . Not on file  Social History Narrative   Right Handed.    Lives in a two story home    Lives with 90 year old son.    Social Drivers of Corporate investment banker Strain: Low Risk  (01/18/2022)   Overall Financial Resource Strain (CARDIA)   . Difficulty of Paying Living Expenses: Not hard at all  Food Insecurity: No Food Insecurity (09/21/2022)   Hunger Vital Sign   . Worried About Programme researcher, broadcasting/film/video in the Last Year: Never true   . Ran Out of Food in the Last Year: Never true  Transportation Needs: No Transportation Needs (06/04/2022)   PRAPARE - Transportation   . Lack of Transportation (Medical): No   . Lack of Transportation (Non-Medical): No  Physical Activity: Inactive (01/18/2022)   Exercise Vital  Sign   . Days of Exercise per Week: 0 days   . Minutes of Exercise per Session: 0 min  Stress: Stress Concern Present (01/18/2022)   [employer] of Occupational Health - Occupational Stress Questionnaire   . Feeling of Stress : To some extent  Social Connections: Socially Isolated (01/18/2022)   Social Connection and Isolation Panel [NHANES]   . Frequency of Communication with Friends and Family: More than three times a week   . Frequency of Social Gatherings with Friends and Family: More than three times a week   . Attends Religious Services: Never   . Active Member of Clubs or Organizations: No   . Attends Banker Meetings: Never   . Marital Status: Never married  Intimate Partner Violence: Not At Risk (01/18/2022)   Humiliation, Afraid, Rape, and Kick questionnaire   . Fear of Current or Ex-Partner: No   . Emotionally Abused: No   . Physically Abused: No   . Sexually Abused: No    Physical Exam      No future appointments.

## 2023-09-12 ENCOUNTER — Other Ambulatory Visit (HOSPITAL_COMMUNITY): Payer: Self-pay

## 2023-09-12 ENCOUNTER — Telehealth (HOSPITAL_COMMUNITY): Payer: Self-pay | Admitting: Internal Medicine

## 2023-09-12 ENCOUNTER — Other Ambulatory Visit (HOSPITAL_COMMUNITY): Payer: Self-pay | Admitting: Emergency Medicine

## 2023-09-12 NOTE — Progress Notes (Signed)
 Paramedicine Encounter    Patient ID: Thomas West, male    DOB: [birth year], 63 y.o.   MRN: [id number]   Complaints NONE  Assessment A&O x 4, skin W&D w/ good color. Denies chest pain or SOB.  Lung sounds clear bilat and no peripheral edema noted.  Compliance with meds  No- missed 3 pm doses and 5 noon doses.  Pill box filled x 2 weeks  Refills needed Hydralazine   Meds changes since last visit NONE    Social changes NONE   BP 120/78 (BP Location: Left Arm, Patient Position: Sitting, Cuff Size: Normal)   Pulse 95   Wt 164 lb 12.8 oz (74.8 kg)   SpO2 94%   BMI 24.34 kg/m  Weight yesterday- not taken Last visit weight-167lb  ATF [name] A&O x 4, skin W&D w/ good color.  Pt denies chest pain or SOB.  Lung sounds clear and equal bilat w/ no peripheral edema.  Pt has once again not been compliant w/ his medications. He states he had a lot going on w/ family over Easter.  Med box reconciled x 2 weeks.  Once again discussed med compliance.   Saw where [facility] had attempted to reach [name] to schedule a f/u appt and assisted him w/ scheduling same 09/27/23 @ 2:00.  ACTION: Home visit completed  [name] [phone] 09/12/23  Patient Care Team: [name], [name], MD as PCP - General (Family Medicine) Bensimhon, Rheta Celestine, MD as PCP - Cardiology (Cardiology) Daryel Ensign, DO as Consulting Physician (Neurology) Carnell Christian, 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   Pruritic rash 05/25/2022   Polyneuropathy associated with underlying disease (HCC) 01/15/2022   Atherosclerosis of aorta (HCC) 01/15/2022   History of CVA (cerebrovascular accident) without residual deficits 12/24/2021   AKI (acute kidney injury) (HCC) 12/13/2021   Diarrhea 12/13/2021   Shortness of breath 12/13/2021   Chest pain 12/13/2021   Nausea and vomiting 12/13/2021    Heart failure (HCC) 12/08/2021   Hypertension associated with diabetes (HCC) 07/12/2021   Chronic kidney disease, stage 3a (HCC) 07/12/2021   Hyperkalemia 07/12/2021   CHF exacerbation (HCC) 04/20/2021   HFrEF (heart failure with reduced ejection fraction) (HCC) 02/03/2021   Diabetes mellitus (HCC) 03/27/2019   Hyperlipidemia associated with type 2 diabetes mellitus (HCC) 03/27/2019   CAD (coronary artery disease) 03/05/2019   History of MI (myocardial infarction) 03/05/2019   Type 2 diabetes mellitus with diabetic neuropathy, unspecified (HCC) 03/05/2019   HLD (hyperlipidemia) 03/05/2019   GERD (gastroesophageal reflux disease) 03/05/2019   Hypertension, essential, benign 03/05/2019    Current Outpatient Medications:    albuterol  (VENTOLIN  HFA) 108 (90 Base) MCG/ACT inhaler, Inhale 1-2 puffs into the lungs every 6 (six) hours for 1 week, then as needed., Disp: 6.7 g, Rfl: 0   aspirin  EC 81 MG tablet, Take 1 tablet (81 mg total) by mouth daily. Swallow whole., Disp: 30 tablet, Rfl: 11   atorvastatin  (LIPITOR ) 80 MG tablet, Take 1 tablet (80 mg total) by mouth daily., Disp: 90 tablet, Rfl: 3   carvedilol  (COREG ) 12.5 MG tablet, Take 1 tablet (12.5 mg total) by mouth 2 (two) times daily., Disp: 180 tablet, Rfl: 3   clopidogrel  (PLAVIX ) 75 MG tablet, Take 1 tablet (75 mg total) by mouth daily., Disp: 90 tablet, Rfl: 2   cyanocobalamin  (VITAMIN B12) 1000 MCG tablet, Take 1 tablet (1,000 mcg  total) by mouth daily., Disp: 130 tablet, Rfl: 3   DULoxetine  (CYMBALTA ) 30 MG capsule, Take 1 capsule (30 mg total) by mouth daily., Disp: 90 capsule, Rfl: 2   empagliflozin  (JARDIANCE ) 25 MG TABS tablet, Take 1 tablet (25 mg total) by mouth daily before breakfast., Disp: 90 tablet, Rfl: 2   ezetimibe  (ZETIA ) 10 MG tablet, Take 1 tablet (10 mg total) by mouth daily., Disp: 90 tablet, Rfl: 1   hydrALAZINE  (APRESOLINE ) 50 MG tablet, Take 2 tablets (100 mg total) by mouth 3 (three) times daily., Disp: 180  tablet, Rfl: 11   hydrocortisone  cream 1 %, Apply 1 Application topically 2 (two) times daily as needed for itching., Disp: 45 g, Rfl: 1   isosorbide  mononitrate (IMDUR ) 60 MG 24 hr tablet, Take 1.5 tablets (90 mg total) by mouth daily., Disp: 45 tablet, Rfl: 6   linagliptin  (TRADJENTA ) 5 MG TABS tablet, Take 1 tablet (5 mg total) by mouth daily., Disp: 30 tablet, Rfl: 3   sacubitril -valsartan  (ENTRESTO ) 97-103 MG, Take 1 tablet by mouth 2 (two) times daily., Disp: 60 tablet, Rfl: 3   Semaglutide  (RYBELSUS ) 3 MG TABS, Take 1 tablet (3 mg total) by mouth daily., Disp: 30 tablet, Rfl: 4   spironolactone  (ALDACTONE ) 25 MG tablet, Take 0.5 tablets (12.5 mg total) by mouth daily., Disp: 45 tablet, Rfl: 3   Tafamidis  (VYNDAMAX ) 61 MG CAPS, Take 1 capsule by mouth daily., Disp: 30 capsule, Rfl: 11   torsemide  (DEMADEX ) 20 MG tablet, Take 1 tablet (20 mg total) by mouth every other day., Disp: 45 tablet, Rfl: 3 Allergies  Allergen Reactions   Bee Venom Anaphylaxis, Swelling and Other (See Comments)    Swells all over     Social History   Socioeconomic History   Marital status: Single    Spouse name: Not on file   Number of children: Not on file   Years of education: Not on file   Highest education level: Not on file  Occupational History   Not on file  Tobacco Use   Smoking status: Former    Current packs/day: 0.00    Types: Cigarettes    Quit date: 10/06/2017    Years since quitting: 5.9   Smokeless tobacco: Never  Substance and Sexual Activity   Alcohol use: Yes    Comment: Occasional Beer   Drug use: Yes    Types: Marijuana   Sexual activity: Not on file  Other Topics Concern   Not on file  Social History Narrative   Right Handed.    Lives in a two story home    Lives with 20 year old son.    Social Drivers of Corporate investment banker Strain: Low Risk  (01/18/2022)   Overall Financial Resource Strain (CARDIA)    Difficulty of Paying Living Expenses: Not hard at all   Food Insecurity: No Food Insecurity (09/21/2022)   Hunger Vital Sign    Worried About Running Out of Food in the Last Year: Never true    Ran Out of Food in the Last Year: Never true  Transportation Needs: No Transportation Needs (06/04/2022)   PRAPARE - Administrator, Civil Service (Medical): No    Lack of Transportation (Non-Medical): No  Physical Activity: Inactive (01/18/2022)   Exercise Vital Sign    Days of Exercise per Week: 0 days    Minutes of Exercise per Session: 0 min  Stress: Stress Concern Present (01/18/2022)   [employer] of Occupational Health -  Occupational Stress Questionnaire    Feeling of Stress : To some extent  Social Connections: Socially Isolated (01/18/2022)   Social Connection and Isolation Panel [NHANES]    Frequency of Communication with Friends and Family: More than three times a week    Frequency of Social Gatherings with Friends and Family: More than three times a week    Attends Religious Services: Never    Database administrator or Organizations: No    Attends Banker Meetings: Never    Marital Status: Never married  Intimate Partner Violence: Not At Risk (01/18/2022)   Humiliation, Afraid, Rape, and Kick questionnaire    Fear of Current or Ex-Partner: No    Emotionally Abused: No    Physically Abused: No    Sexually Abused: No    Physical Exam      Future Appointments  Date Time Provider Department Center  09/27/2023  2:00 PM [facility] PA/NP [facility] None

## 2023-09-13 ENCOUNTER — Other Ambulatory Visit (HOSPITAL_COMMUNITY): Payer: Self-pay

## 2023-09-15 ENCOUNTER — Other Ambulatory Visit: Payer: Self-pay

## 2023-09-15 NOTE — Progress Notes (Signed)
 Specialty Pharmacy Refill Coordination Note  Thomas West is a 63 y.o. male contacted today regarding refills of specialty medication(s) Tafamidis  (Vyndamax )   Patient requested Delivery   Delivery date: 09/19/23   Verified address: [address] [place], [place] [address]   Medication will be filled on 09/16/23.

## 2023-09-16 ENCOUNTER — Other Ambulatory Visit: Payer: Self-pay

## 2023-09-26 ENCOUNTER — Telehealth (HOSPITAL_COMMUNITY): Payer: Self-pay

## 2023-09-26 ENCOUNTER — Other Ambulatory Visit: Payer: Self-pay

## 2023-09-26 ENCOUNTER — Other Ambulatory Visit (HOSPITAL_COMMUNITY): Payer: Self-pay | Admitting: Internal Medicine

## 2023-09-26 NOTE — Telephone Encounter (Signed)
 Called to confirm/remind patient of their appointment at the [facility] on 09/27/23.   Appointment:   [] Confirmed  [] Left mess   [x] No answer/No voice mail  [] VM Full/unable to leave message  [] Phone not in service

## 2023-09-27 ENCOUNTER — Other Ambulatory Visit: Payer: Self-pay

## 2023-09-27 ENCOUNTER — Telehealth (HOSPITAL_COMMUNITY): Payer: Self-pay | Admitting: Pharmacy Technician

## 2023-09-27 ENCOUNTER — Ambulatory Visit (HOSPITAL_COMMUNITY)
Admission: RE | Admit: 2023-09-27 | Discharge: 2023-09-27 | Disposition: A | Discharge status: Home / Self Care | Source: Ambulatory Visit | Attending: Cardiology | Admitting: Cardiology

## 2023-09-27 ENCOUNTER — Encounter (HOSPITAL_COMMUNITY): Payer: Self-pay

## 2023-09-27 ENCOUNTER — Other Ambulatory Visit (HOSPITAL_COMMUNITY): Payer: Self-pay

## 2023-09-27 ENCOUNTER — Other Ambulatory Visit (HOSPITAL_COMMUNITY): Payer: Self-pay | Admitting: Emergency Medicine

## 2023-09-27 VITALS — BP 94/76 | HR 83 | Wt 163.8 lb

## 2023-09-27 DIAGNOSIS — Z79899 Other long term (current) drug therapy: Secondary | ICD-10-CM | POA: Diagnosis not present

## 2023-09-27 DIAGNOSIS — Z8673 Personal history of transient ischemic attack (TIA), and cerebral infarction without residual deficits: Secondary | ICD-10-CM | POA: Diagnosis not present

## 2023-09-27 DIAGNOSIS — I43 Cardiomyopathy in diseases classified elsewhere: Secondary | ICD-10-CM | POA: Insufficient documentation

## 2023-09-27 DIAGNOSIS — N1832 Chronic kidney disease, stage 3b: Secondary | ICD-10-CM | POA: Insufficient documentation

## 2023-09-27 DIAGNOSIS — I10 Essential (primary) hypertension: Secondary | ICD-10-CM | POA: Diagnosis not present

## 2023-09-27 DIAGNOSIS — E11610 Type 2 diabetes mellitus with diabetic neuropathic arthropathy: Secondary | ICD-10-CM

## 2023-09-27 DIAGNOSIS — I5022 Chronic systolic (congestive) heart failure: Secondary | ICD-10-CM | POA: Diagnosis not present

## 2023-09-27 DIAGNOSIS — I252 Old myocardial infarction: Secondary | ICD-10-CM | POA: Insufficient documentation

## 2023-09-27 DIAGNOSIS — I2510 Atherosclerotic heart disease of native coronary artery without angina pectoris: Secondary | ICD-10-CM | POA: Diagnosis not present

## 2023-09-27 DIAGNOSIS — E1140 Type 2 diabetes mellitus with diabetic neuropathy, unspecified: Secondary | ICD-10-CM | POA: Insufficient documentation

## 2023-09-27 DIAGNOSIS — W010XXA Fall on same level from slipping, tripping and stumbling without subsequent striking against object, initial encounter: Secondary | ICD-10-CM | POA: Insufficient documentation

## 2023-09-27 DIAGNOSIS — M25562 Pain in left knee: Secondary | ICD-10-CM | POA: Insufficient documentation

## 2023-09-27 DIAGNOSIS — Z7984 Long term (current) use of oral hypoglycemic drugs: Secondary | ICD-10-CM | POA: Diagnosis not present

## 2023-09-27 DIAGNOSIS — E854 Organ-limited amyloidosis: Secondary | ICD-10-CM

## 2023-09-27 DIAGNOSIS — Y9302 Activity, running: Secondary | ICD-10-CM | POA: Diagnosis not present

## 2023-09-27 DIAGNOSIS — E1122 Type 2 diabetes mellitus with diabetic chronic kidney disease: Secondary | ICD-10-CM | POA: Diagnosis not present

## 2023-09-27 DIAGNOSIS — Z87891 Personal history of nicotine dependence: Secondary | ICD-10-CM | POA: Insufficient documentation

## 2023-09-27 DIAGNOSIS — M25561 Pain in right knee: Secondary | ICD-10-CM | POA: Insufficient documentation

## 2023-09-27 DIAGNOSIS — I130 Hypertensive heart and chronic kidney disease with heart failure and stage 1 through stage 4 chronic kidney disease, or unspecified chronic kidney disease: Secondary | ICD-10-CM | POA: Diagnosis not present

## 2023-09-27 DIAGNOSIS — N183 Chronic kidney disease, stage 3 unspecified: Secondary | ICD-10-CM

## 2023-09-27 DIAGNOSIS — Z9181 History of falling: Secondary | ICD-10-CM

## 2023-09-27 DIAGNOSIS — M7989 Other specified soft tissue disorders: Secondary | ICD-10-CM | POA: Diagnosis not present

## 2023-09-27 DIAGNOSIS — G629 Polyneuropathy, unspecified: Secondary | ICD-10-CM

## 2023-09-27 LAB — LIPID PANEL
Cholesterol: 123 mg/dL (ref 0–200)
HDL: 50 mg/dL (ref >40)
LDL Cholesterol: 57 mg/dL (ref 0–99)
Total CHOL/HDL Ratio: 2.5 ratio
Triglycerides: 81 mg/dL (ref <150)
VLDL: 16 mg/dL (ref 0–40)

## 2023-09-27 LAB — BASIC METABOLIC PANEL WITH GFR
Anion gap: 13 (ref 5–15)
BUN: 21 mg/dL — ABNORMAL HIGH (ref 6–20)
CO2: 23 mmol/L (ref 22–32)
Calcium: 8.8 mg/dL — ABNORMAL LOW (ref 8.9–10.3)
Chloride: 104 mmol/L (ref 98–111)
Creatinine, Ser: 1.69 mg/dL — ABNORMAL HIGH (ref 0.61–1.24)
GFR, Estimated: 46 mL/min — ABNORMAL LOW (ref >60)
Glucose, Bld: 114 mg/dL — ABNORMAL HIGH (ref 70–99)
Potassium: 4.1 mmol/L (ref 3.5–5.1)
Sodium: 140 mmol/L (ref 135–145)

## 2023-09-27 NOTE — Patient Instructions (Addendum)
 Thank you for coming in today  If you had labs drawn today, any labs that are abnormal the clinic will call you No news is good news  Medications: No changes made  Follow up appointments:  Your physician recommends that you schedule a follow-up appointment in:  3 months With Dr. Bensimhon with echocardiogram  Your physician has requested that you have an echocardiogram. Echocardiography is a painless test that uses sound waves to create images of your heart. It provides your doctor with information about the size and shape of your heart and how well your heart's chambers and valves are working. This procedure takes approximately one hour. There are no restrictions for this procedure.      Do the following things EVERYDAY: Weigh yourself in the morning before breakfast. Write it down and keep it in a log. Take your medicines as prescribed Eat low salt foods--Limit salt (sodium) to 2000 mg per day.  Stay as active as you can everyday Limit all fluids for the day to less than 2 liters   At the [facility], you and your health needs are our priority. As part of our continuing mission to provide you with exceptional heart care, we have created designated Provider Care Teams. These Care Teams include your primary Cardiologist (physician) and Advanced Practice Providers (APPs- Physician Assistants and Nurse Practitioners) who all work together to provide you with the care you need, when you need it.   You may see any of the following providers on your designated Care Team at your next follow up: Dr Jules Oar Dr Peder Bourdon Dr. Mimi Alt, NP Ruddy Corral, [place] [facility] [place], [place] Dennise Fitz, NP Luster Salters, PharmD   Please be sure to bring in all your medications bottles to every appointment.    Thank you for choosing [facility]  If you have any questions or concerns before your  next appointment please send us  a message through [place] or call our office at [phone].    TO LEAVE A MESSAGE FOR THE NURSE SELECT OPTION 2, PLEASE LEAVE A MESSAGE INCLUDING: YOUR NAME DATE OF BIRTH CALL BACK NUMBER REASON FOR CALL**this is important as we prioritize the call backs  YOU WILL RECEIVE A CALL BACK THE SAME DAY AS LONG AS YOU CALL BEFORE 4:00 PM

## 2023-09-27 NOTE — Progress Notes (Signed)
 Paramedicine Encounter    Patient ID: Thomas West, male    DOB: Jul 02, 1960, 63 y.o.   MRN: [id number]   Complaints bilat knee pain from mechanical fall yesterday  Assessment A&O x 4, skin W&D w/ good color.  Denies chest pain or SOB.  Lung sounds w/ wheezing bilat and no peripheral edema noted  Compliance with meds yes  Pill box filled x 2 weeks  Refills needed Rybelsus , ASA  Meds changes since last visit NONE   Social changes NONE   BP 118/78 (BP Location: Left Arm, Patient Position: Sitting, Cuff Size: Normal)   Pulse 87   Resp 16   SpO2 96%  Weight yesterday- not taken Last visit weight- 169lb  [name] A&O x 4, skin W&D w/ good color.  Pt denies chest pain or SOB.  Lung sounds w/ mild wheezing throughout.  I advised him that this would be an appropriate time for him to use his inhaler.  I demonstrated how to use and he did same.   He's done well w/ med compliance.  Med box reconciled x 2 weeks.  He has an appointment w/ [facility] Clinic today @ 2:00.  He initially stated he was going to have to reschedule due to soreness from a mechanical fall which occurred yesterday.  Pt says he was running and tripped on some loose gravel.  He has bilateral knee bruises w/ abrasion to the left knee.  I encouraged him to make every effort to make the appointment and he said he would try.  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   Pruritic rash 05/25/2022   Polyneuropathy associated with underlying disease (HCC) 01/15/2022   Atherosclerosis of aorta (HCC) 01/15/2022   History of CVA  (cerebrovascular accident) without residual deficits 12/24/2021   AKI (acute kidney injury) (HCC) 12/13/2021   Diarrhea 12/13/2021   Shortness of breath 12/13/2021   Chest pain 12/13/2021   Nausea and vomiting 12/13/2021   Heart failure (HCC) 12/08/2021   Hypertension associated with diabetes (HCC) 07/12/2021   Chronic kidney disease, stage 3a (HCC) 07/12/2021   Hyperkalemia 07/12/2021   CHF exacerbation (HCC) 04/20/2021   HFrEF (heart failure with reduced ejection fraction) (HCC) 02/03/2021   Diabetes mellitus (HCC) 03/27/2019   Hyperlipidemia associated with type 2 diabetes mellitus (HCC) 03/27/2019   CAD (coronary artery disease) 03/05/2019   History of MI (myocardial infarction) 03/05/2019   Type 2 diabetes mellitus with diabetic neuropathy, unspecified (HCC) 03/05/2019   HLD (hyperlipidemia) 03/05/2019   GERD (gastroesophageal reflux disease) 03/05/2019   Hypertension, essential, benign 03/05/2019    Current Outpatient Medications:    albuterol  (VENTOLIN  HFA) 108 (90 Base) MCG/ACT inhaler, Inhale 1-2 puffs into the lungs every 6 (six) hours for 1 week, then as needed., Disp: 6.7 g, Rfl: 0   atorvastatin  (LIPITOR ) 80 MG tablet, Take 1 tablet (80 mg total) by mouth daily., Disp: 90 tablet, Rfl: 3   carvedilol  (COREG ) 12.5 MG tablet, Take 1 tablet (12.5 mg total) by mouth 2 (two) times daily., Disp: 180 tablet, Rfl: 3   clopidogrel  (PLAVIX ) 75 MG tablet, Take 1 tablet (75 mg total) by mouth daily., Disp:  90 tablet, Rfl: 2   cyanocobalamin  (VITAMIN B12) 1000 MCG tablet, Take 1 tablet (1,000 mcg total) by mouth daily., Disp: 130 tablet, Rfl: 3   DULoxetine  (CYMBALTA ) 30 MG capsule, Take 1 capsule (30 mg total) by mouth daily., Disp: 90 capsule, Rfl: 2   empagliflozin  (JARDIANCE ) 25 MG TABS tablet, Take 1 tablet (25 mg total) by mouth daily before breakfast., Disp: 90 tablet, Rfl: 2   ezetimibe  (ZETIA ) 10 MG tablet, Take 1 tablet (10 mg total) by mouth daily., Disp: 90 tablet, Rfl: 1    hydrALAZINE  (APRESOLINE ) 50 MG tablet, Take 2 tablets (100 mg total) by mouth 3 (three) times daily., Disp: 180 tablet, Rfl: 11   hydrocortisone  cream 1 %, Apply 1 Application topically 2 (two) times daily as needed for itching., Disp: 45 g, Rfl: 1   isosorbide  mononitrate (IMDUR ) 60 MG 24 hr tablet, Take 1.5 tablets (90 mg total) by mouth daily., Disp: 45 tablet, Rfl: 6   linagliptin  (TRADJENTA ) 5 MG TABS tablet, Take 1 tablet (5 mg total) by mouth daily., Disp: 30 tablet, Rfl: 3   sacubitril -valsartan  (ENTRESTO ) 97-103 MG, Take 1 tablet by mouth 2 (two) times daily., Disp: 60 tablet, Rfl: 3   Semaglutide  (RYBELSUS ) 3 MG TABS, Take 1 tablet (3 mg total) by mouth daily., Disp: 30 tablet, Rfl: 4   spironolactone  (ALDACTONE ) 25 MG tablet, Take 0.5 tablets (12.5 mg total) by mouth daily., Disp: 45 tablet, Rfl: 3   Tafamidis  (VYNDAMAX ) 61 MG CAPS, Take 1 capsule by mouth daily., Disp: 30 capsule, Rfl: 11   torsemide  (DEMADEX ) 20 MG tablet, Take 1 tablet (20 mg total) by mouth every other day., Disp: 45 tablet, Rfl: 3   aspirin  EC 81 MG tablet, Take 1 tablet (81 mg total) by mouth daily. Swallow whole. (Patient not taking: Reported on 09/27/2023), Disp: 30 tablet, Rfl: 11 Allergies  Allergen Reactions   Bee Venom Anaphylaxis, Swelling and Other (See Comments)    Swells all over     Social History   Socioeconomic History   Marital status: Single    Spouse name: Not on file   Number of children: Not on file   Years of education: Not on file   Highest education level: Not on file  Occupational History   Not on file  Tobacco Use   Smoking status: Former    Current packs/day: 0.00    Types: Cigarettes    Quit date: 10/06/2017    Years since quitting: 5.9   Smokeless tobacco: Never  Substance and Sexual Activity   Alcohol use: Yes    Comment: Occasional Beer   Drug use: Yes    Types: Marijuana   Sexual activity: Not on file  Other Topics Concern   Not on file  Social History Narrative    Right Handed.    Lives in a two story home    Lives with 59 year old son.    Social Drivers of Corporate investment banker Strain: Low Risk  (01/18/2022)   Overall Financial Resource Strain (CARDIA)    Difficulty of Paying Living Expenses: Not hard at all  Food Insecurity: No Food Insecurity (09/21/2022)   Hunger Vital Sign    Worried About Running Out of Food in the Last Year: Never true    Ran Out of Food in the Last Year: Never true  Transportation Needs: No Transportation Needs (06/04/2022)   PRAPARE - Administrator, Civil Service (Medical): No    Lack of Transportation (  Non-Medical): No  Physical Activity: Inactive (01/18/2022)   Exercise Vital Sign    Days of Exercise per Week: 0 days    Minutes of Exercise per Session: 0 min  Stress: Stress Concern Present (01/18/2022)   [employer] of Occupational Health - Occupational Stress Questionnaire    Feeling of Stress : To some extent  Social Connections: Socially Isolated (01/18/2022)   Social Connection and Isolation Panel [NHANES]    Frequency of Communication with Friends and Family: More than three times a week    Frequency of Social Gatherings with Friends and Family: More than three times a week    Attends Religious Services: Never    Database administrator or Organizations: No    Attends Banker Meetings: Never    Marital Status: Never married  Intimate Partner Violence: Not At Risk (01/18/2022)   Humiliation, Afraid, Rape, and Kick questionnaire    Fear of Current or Ex-Partner: No    Emotionally Abused: No    Physically Abused: No    Sexually Abused: No    Physical Exam      Future Appointments  Date Time Provider Department Center  09/27/2023  2:00 PM [facility] PA/NP [facility] None

## 2023-09-27 NOTE — Progress Notes (Signed)
 Advanced Heart Failure Clinic Note   Primary Care: Dr. Betty Thomas HF Cardiologist: Dr. Julane Ny  Reason for Visit: Heart Failure Follow-up HPI: Thomas West is a 63 y.o. male with history of CAD with prior MI and stent in 01/2019 at [facility] in [place] (report not available), cardiomyopathy/chronic systolic HF, uncontrolled DM II, HTN, hx CVA on chart review, CKD.    Had not medical f/u until he was admitted in 10/22 with a/c systolic HF. Had been out of cardiac medications. Diuresed and started on GDMT with carvedilol , hydralazine , imdur  and empagliflozin .    Echo 10/22 with EF 20-25%, RV okay, trivial MR. [facility] 10/22 with nonobstructive CAD and patent RPLV stent. RA 5, PCWP 5, CO/CI (fick) 7.46/3.85.   He did not show up for [facility] appointment after discharge.   cMRI (11/22): LVEF 24% RVEF 21% LGE and ECW suggestive of cardiac amyloidosis. 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  Admitted 12/22 with a/c CHF due to noncompliance with GDMT. GDMT titrated. Admitted 2/23 with a/c CHF after running out of meds x 1 week. GDMT restarted. Seen in ED 3/23 with back pain, felt to be related to MSK. Seen in ED 08/18/21 with SOB, cardiac work up unrevealing.  Admitted 7/23 post fall w/ a/c CHF c/b AKI. Enrolled in paramedicine at discharge. Had been seen following day doing well on all meds. [place], inevitably was readmitted 7/23 w/ persistent N/V/D with intermittent CP and SOB. Of note, presented with AKI (Cr 3.3) 2/2 emesis/diarrhea. Given IVF, and resumed on GDMT.   Admitted 8/23 with a/c CHF. Diuresed with IV lasix . Hospitalization c/b with AKI, GDMT initially held; Entresto  resumed at low dose, torsemide  on MWF. Discharged weight 145 lbs.  Seen in ED 1/25 with CHF exacerbation. Torsemide  increased to daily. Doing well at follow up [facility] 06/18/23.   He returns today for heart failure follow up. Overall feeling well. Arrived by wheelchair, as he  was jogging yesterday and tripped and fell, scrapping his knees. He is able to walk and move at the knee, however is swollen and sore. NYHA II. Reports fatigue. Denies chest pain, dyspnea, near-syncope, orthopnea, palpitations, dizziness, and abnormal bleeding. Able to perform ADLs. Appetite okay.  BP at home stable. Compliant with all medications. Stays active. Smoke marijuana a few times a week. Enrolled in Paramedicine.   Cardiac Studies: - Echo (9/24) EF 45%  - Echo (6/23): EF 30-35%, LV global hypokinesis, Grade 1 DD, RV function normal - PYP (12/22): suggestive to TTR amyloid and may benefit from addition of tafamadis as outpatient.  - cMRI (11/22): LVEF 24% RVEF 21% LGE and ECW suggestive of cardiac amyloidosis  ROS: All systems reviewed and negative except as per HPI.   Past Medical History:  Diagnosis Date   CHF (congestive heart failure) (HCC)    Coronary artery disease    Diabetes mellitus without complication (HCC)    History of MI (myocardial infarction) 03/05/2019   Hypertension    Stroke [facility])    Current Outpatient Medications  Medication Sig Dispense Refill   albuterol  (VENTOLIN  HFA) 108 (90 Base) MCG/ACT inhaler Inhale 1-2 puffs into the lungs every 6 (six) hours for 1 week, then as needed. 6.7 g 0   aspirin  EC 81 MG tablet Take 1 tablet (81 mg total) by mouth daily. Swallow whole. 30 tablet 11   atorvastatin  (LIPITOR ) 80 MG tablet Take 1 tablet (80 mg total) by mouth daily. 90 tablet 3   carvedilol  (COREG )  12.5 MG tablet Take 1 tablet (12.5 mg total) by mouth 2 (two) times daily. 180 tablet 3   clopidogrel  (PLAVIX ) 75 MG tablet Take 1 tablet (75 mg total) by mouth daily. 90 tablet 2   cyanocobalamin  (VITAMIN B12) 1000 MCG tablet Take 1 tablet (1,000 mcg total) by mouth daily. 130 tablet 3   DULoxetine  (CYMBALTA ) 30 MG capsule Take 1 capsule (30 mg total) by mouth daily. 90 capsule 2   empagliflozin  (JARDIANCE ) 25 MG TABS tablet Take 1 tablet (25 mg total) by mouth daily  before breakfast. 90 tablet 2   ezetimibe  (ZETIA ) 10 MG tablet Take 1 tablet (10 mg total) by mouth daily. 90 tablet 1   hydrALAZINE  (APRESOLINE ) 50 MG tablet Take 2 tablets (100 mg total) by mouth 3 (three) times daily. 180 tablet 11   hydrocortisone  cream 1 % Apply 1 Application topically 2 (two) times daily as needed for itching. 45 g 1   isosorbide  mononitrate (IMDUR ) 60 MG 24 hr tablet Take 1.5 tablets (90 mg total) by mouth daily. 45 tablet 6   linagliptin  (TRADJENTA ) 5 MG TABS tablet Take 1 tablet (5 mg total) by mouth daily. 30 tablet 3   sacubitril -valsartan  (ENTRESTO ) 97-103 MG Take 1 tablet by mouth 2 (two) times daily. 60 tablet 3   Semaglutide  (RYBELSUS ) 3 MG TABS Take 1 tablet (3 mg total) by mouth daily. 30 tablet 4   spironolactone  (ALDACTONE ) 25 MG tablet Take 0.5 tablets (12.5 mg total) by mouth daily. 45 tablet 3   Tafamidis  (VYNDAMAX ) 61 MG CAPS Take 1 capsule by mouth daily. 30 capsule 11   torsemide  (DEMADEX ) 20 MG tablet Take 1 tablet (20 mg total) by mouth every other day. 45 tablet 3   No current facility-administered medications for this encounter.   Allergies  Allergen Reactions   Bee Venom Anaphylaxis, Swelling and Other (See Comments)    Swells all over   Social History   Socioeconomic History   Marital status: Single    Spouse name: Not on file   Number of children: Not on file   Years of education: Not on file   Highest education level: Not on file  Occupational History   Not on file  Tobacco Use   Smoking status: Former    Current packs/day: 0.00    Types: Cigarettes    Quit date: 10/06/2017    Years since quitting: 5.9   Smokeless tobacco: Never  Substance and Sexual Activity   Alcohol use: Yes    Comment: Occasional Beer   Drug use: Yes    Types: Marijuana   Sexual activity: Not on file  Other Topics Concern   Not on file  Social History Narrative   Right Handed.    Lives in a two story home    Lives with 35 year old son.    Social  Drivers of Corporate investment banker Strain: Low Risk  (01/18/2022)   Overall Financial Resource Strain (CARDIA)    Difficulty of Paying Living Expenses: Not hard at all  Food Insecurity: No Food Insecurity (09/21/2022)   Hunger Vital Sign    Worried About Running Out of Food in the Last Year: Never true    Ran Out of Food in the Last Year: Never true  Transportation Needs: No Transportation Needs (06/04/2022)   PRAPARE - Administrator, Civil Service (Medical): No    Lack of Transportation (Non-Medical): No  Physical Activity: Inactive (01/18/2022)   Exercise Vital Sign  Days of Exercise per Week: 0 days    Minutes of Exercise per Session: 0 min  Stress: Stress Concern Present (01/18/2022)   [employer] of Occupational Health - Occupational Stress Questionnaire    Feeling of Stress : To some extent  Social Connections: Socially Isolated (01/18/2022)   Social Connection and Isolation Panel [NHANES]    Frequency of Communication with Friends and Family: More than three times a week    Frequency of Social Gatherings with Friends and Family: More than three times a week    Attends Religious Services: Never    Database administrator or Organizations: No    Attends Banker Meetings: Never    Marital Status: Never married  Intimate Partner Violence: Not At Risk (01/18/2022)   Humiliation, Afraid, Rape, and Kick questionnaire    Fear of Current or Ex-Partner: No    Emotionally Abused: No    Physically Abused: No    Sexually Abused: No   Family History  Problem Relation Age of Onset   Diabetes Mother    Kidney failure Mother    BP 94/76   Pulse 83   Wt 74.3 kg (163 lb 12.8 oz)   SpO2 95%   BMI 24.19 kg/m   Wt Readings from Last 3 Encounters:  09/27/23 74.3 kg (163 lb 12.8 oz)  09/27/23 73.8 kg (162 lb 12.8 oz)  09/12/23 74.8 kg (164 lb 12.8 oz)   PHYSICAL EXAM: General: Well appearing. No distress on RA. Arrived by [facility]. Cardiac: JVP flat. S1 and  S2 present. No murmurs or rub. Abdomen: Soft, non-tender, non-distended.  Extremities: Warm and dry.  No peripheral edema.  Neuro: Alert and oriented x3. Affect pleasant.   ASSESSMENT & PLAN: Chronic Systolic HF/Cardiac amyloidosis - Onset not certain. He reports CM diagnosed just prior to MI while living in [place] a couple of years ago. - Echo (10/22): EF 20-25%, RV ok - R/LHC (10/22): Patent RPLV stent with nonobstructive disease elsewhere, Preserved CO/CI, RA mean 5 mmHg, PCWP mean 5 mmHg - cMRI (11/22): LVEF 24% RVEF 21% LGE and ECW suggestive of cardiac amyloidosis. 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 - Multiple myeloma panel (12/22) with no m-spike. 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 Improved to 45% in (9/24). - Much improved NYHA II. Limited by neuropathy. Volume ok.  - Continue torsemide  20 mg daily. BMET today - Continue hydralazine  100 mg tid + Imdur  90 mg daily.  - Continue Entresto  97/103 mg bid. - Continue Jardiance  25 mg daily. No GU symptoms. - Continue carvedilol  6.25>12.5 mg bid.  - Continue spironolactone  25 mg daily.   - Continue Tafamadis 61 mg daily - Consider addition of silencer when approved for non-familial - Repeat echo in 3 months  2. wtTTR cardiac amyloidosis - Continue tafamadis. - Has seen Dr. Danika Patel  - Consider addition of silencer when approved for non-familial  3. CAD - Prior MI followed by PCI in [place]  in either 2019 or 2020.  - Patent RPLV stent on [facility] 10/22. Also had 20% distal RCA, 30% p LAD, 70% d LAD, 60% OM2 - Stent placed 2020. - No s/s angina  - Continue atorva 80 mg + ASA. Check lipid panel   4. HTN - Blood pressure soft. No dizziness/lightheadedness. BP followed by Paramedic - Continue Imdur  as above  5. T2DM - Prev uncontrolled, however A1c has been in 7s  for over a year. - Continue Jardiance .  6. Neuropathy - Unclear if due to  DM2 or TTR - Saw Dr. Lydia Sams in Neurology as above, with wild type TTR, current therapies not approved. - Continue supportive care.  7. CKD stage IIIb - Baseline SCr 1.7-2.0 - Continue Jardiance  - BMET today.    8. Hx fall/recent fall - CT: L-spine on 12/08/2021 was negative for acute fracture or subluxation of the L-spine. - Was jogging when tripped and scrapped knees. Was not having any symptoms prior to or after fall.  9. SDOH - He has Medicaid and disability. - Transportation is an issue, he has a car but unable to drive (needs license). - 36 year old son helps with meds. - Enrolled in Paramedicine  Follow up in 3-4 months with Dr. Julane Ny + echo  Thomas Kainan Patty, NP  2:01 PM

## 2023-09-27 NOTE — Telephone Encounter (Signed)
 Advanced Heart Failure Patient Advocate Encounter  Prior Authorization for Vyndamax  has been approved.    PA# [id number] Effective dates: 09/27/23 through 09/26/24  Correne Dillon, CPhT

## 2023-09-27 NOTE — Telephone Encounter (Signed)
 Patient Advocate Encounter   Received notification from [employer] part D that prior authorization for Vyndamax  is required.   PA submitted on CoverMyMeds Key [id number] Status is pending   Will continue to follow.

## 2023-09-28 ENCOUNTER — Other Ambulatory Visit (HOSPITAL_COMMUNITY): Payer: Self-pay

## 2023-09-28 MED ORDER — ENTRESTO 97-103 MG PO TABS
1.0000 | ORAL_TABLET | Freq: Two times a day (BID) | ORAL | 3 refills | Status: DC
  Filled 2023-09-28: qty 60, 30d supply, fill #0
  Filled 2023-10-21: qty 60, 30d supply, fill #1
  Filled 2023-11-21: qty 60, 30d supply, fill #2
  Filled 2023-12-20: qty 60, 30d supply, fill #3

## 2023-10-06 ENCOUNTER — Telehealth: Payer: Self-pay

## 2023-10-06 NOTE — Telephone Encounter (Signed)
 I spoke with the patient and he did receive Cologuard. Patient reported he has upcoming eye exam and was advised to have exam notes sent to our office after completion.

## 2023-10-06 NOTE — Telephone Encounter (Signed)
-----   Message from Maurie Southern sent at 09/15/2023  8:36 AM EDT ----- Wylee Dorantes morning,  Can you please follow up with this patient to ensure received cologuard test and completed and to see if they did the eye exam? If not, please assist if needed in accomplishing these. Thanks!  Alisa App ----- Message ----- From: Maurie Southern, DO Sent: 09/15/2023  12:00 AM EDT To: Maurie Southern, DO  Cologuard, eye exam

## 2023-10-11 ENCOUNTER — Other Ambulatory Visit (HOSPITAL_COMMUNITY): Payer: Self-pay | Admitting: Emergency Medicine

## 2023-10-11 ENCOUNTER — Other Ambulatory Visit: Payer: Self-pay

## 2023-10-11 ENCOUNTER — Other Ambulatory Visit (HOSPITAL_COMMUNITY): Payer: Self-pay

## 2023-10-11 NOTE — Progress Notes (Signed)
 Specialty Pharmacy Refill Coordination Note  Spoke with [name], patient's nurse. Thomas West is a 63 y.o. male contacted today regarding refills of specialty medication(s) Tafamidis  (Vyndamax )   Patient requested Delivery   Delivery date: 10/27/23   Verified address: [address] [name], [place] [address]   Medication will be filled on 10/26/23.

## 2023-10-11 NOTE — Progress Notes (Signed)
 Paramedicine Encounter    Patient ID: Thomas West, male    DOB: 09-17-1960, 63 y.o.   MRN: [id number]   Complaints NONE  Assessment A&O x 4, ski W&D w/ good color.  Denies chest pain or SOB.  Lung sounds clear throughout. No peripheral edema noted.  BP elevated.  Denies headache, visual disturbance and no obvious neurological deficits.  Has not taken his morning meds yet.    Compliance with meds YES  Pill box filled x 2 weeks  Refills needed Hydralazine   Meds changes since last visit NONE    Social changes NONE   BP (!) 210/120 (BP Location: Left Arm, Patient Position: Sitting, Cuff Size: Normal)   Pulse 78   Resp 16   Wt 165 lb 9.6 oz (75.1 kg)   SpO2 98%   BMI 24.45 kg/m  Weight yesterday- not taken Last visit weight-162.8   [name] A&O x 4, skin W&D w/ good color. Denies chest pain or SOB.  Lung sounds clear bilat and no peripheral edema noted.  Pt states, "I feel really good. I'm excited that I am going to [place]  w/ my son for a fishing trip." Pt's BP is elevated this morning.  He has not yet taken his morning meds.  He denies headache, no visual disturbances and no neurological deficits noted.  Med box reconciled x 2 weeks.  Next home visit scheduled for 10/25/23 @ 8:30.  Will make sure he has all meds ready for his trip.  ACTION: Home visit completed  Thomas West [phone] 10/11/23  Patient Care Team: [name], [name], MD as PCP - General (Family Medicine) Bensimhon, Rheta Celestine, MD as PCP - Cardiology (Cardiology) Thomas Ensign, DO as Consulting Physician (Neurology) Thomas West, [facility] (Pharmacist) Thomas West, Thomas West (Pharmacist)  Patient Active Problem List   Diagnosis Date Noted   Cardiac amyloidosis Thomas West) 05/25/2022   Pruritic rash 05/25/2022   Polyneuropathy associated with underlying disease (HCC) 01/15/2022   Atherosclerosis of aorta (HCC) 01/15/2022   History of CVA (cerebrovascular accident) without residual deficits  12/24/2021   AKI (acute kidney injury) (HCC) 12/13/2021   Diarrhea 12/13/2021   Shortness of breath 12/13/2021   Chest pain 12/13/2021   Nausea and vomiting 12/13/2021   Heart failure (HCC) 12/08/2021   Hypertension associated with diabetes (HCC) 07/12/2021   Chronic kidney disease, stage 3a (HCC) 07/12/2021   Hyperkalemia 07/12/2021   CHF exacerbation (HCC) 04/20/2021   HFrEF (heart failure with reduced ejection fraction) (HCC) 02/03/2021   Diabetes mellitus (HCC) 03/27/2019   Hyperlipidemia associated with type 2 diabetes mellitus (HCC) 03/27/2019   CAD (coronary artery disease) 03/05/2019   History of MI (myocardial infarction) 03/05/2019   Type 2 diabetes mellitus with diabetic neuropathy, unspecified (HCC) 03/05/2019   HLD (hyperlipidemia) 03/05/2019   GERD (gastroesophageal reflux disease) 03/05/2019   Hypertension, essential, benign 03/05/2019    Current Outpatient Medications:    albuterol  (VENTOLIN  HFA) 108 (90 Base) MCG/ACT inhaler, Inhale 1-2 puffs into the lungs every 6 (six) hours for 1 week, then as needed., Disp: 6.7 West, Rfl: 0   aspirin  EC 81 MG tablet, Take 1 tablet (81 mg total) by mouth daily. Swallow whole., Disp: 30 tablet, Rfl: 11   atorvastatin  (LIPITOR ) 80 MG tablet, Take 1 tablet (80 mg total) by mouth daily., Disp: 90 tablet, Rfl: 3   carvedilol  (COREG ) 12.5 MG tablet, Take 1 tablet (12.5 mg total) by mouth 2 (two) times daily., Disp: 180 tablet, Rfl: 3  clopidogrel  (PLAVIX ) 75 MG tablet, Take 1 tablet (75 mg total) by mouth daily., Disp: 90 tablet, Rfl: 2   cyanocobalamin  (VITAMIN B12) 1000 MCG tablet, Take 1 tablet (1,000 mcg total) by mouth daily., Disp: 130 tablet, Rfl: 3   DULoxetine  (CYMBALTA ) 30 MG capsule, Take 1 capsule (30 mg total) by mouth daily., Disp: 90 capsule, Rfl: 2   empagliflozin  (JARDIANCE ) 25 MG TABS tablet, Take 1 tablet (25 mg total) by mouth daily before breakfast., Disp: 90 tablet, Rfl: 2   ezetimibe  (ZETIA ) 10 MG tablet, Take 1  tablet (10 mg total) by mouth daily., Disp: 90 tablet, Rfl: 1   hydrALAZINE  (APRESOLINE ) 50 MG tablet, Take 2 tablets (100 mg total) by mouth 3 (three) times daily., Disp: 180 tablet, Rfl: 11   hydrocortisone  cream 1 %, Apply 1 Application topically 2 (two) times daily as needed for itching., Disp: 45 West, Rfl: 1   isosorbide  mononitrate (IMDUR ) 60 MG 24 hr tablet, Take 1.5 tablets (90 mg total) by mouth daily., Disp: 45 tablet, Rfl: 6   linagliptin  (TRADJENTA ) 5 MG TABS tablet, Take 1 tablet (5 mg total) by mouth daily., Disp: 30 tablet, Rfl: 3   sacubitril -valsartan  (ENTRESTO ) 97-103 MG, Take 1 tablet by mouth 2 (two) times daily., Disp: 60 tablet, Rfl: 3   Semaglutide  (RYBELSUS ) 3 MG TABS, Take 1 tablet (3 mg total) by mouth daily., Disp: 30 tablet, Rfl: 4   spironolactone  (ALDACTONE ) 25 MG tablet, Take 0.5 tablets (12.5 mg total) by mouth daily., Disp: 45 tablet, Rfl: 3   Tafamidis  (VYNDAMAX ) 61 MG CAPS, Take 1 capsule by mouth daily., Disp: 30 capsule, Rfl: 11   torsemide  (DEMADEX ) 20 MG tablet, Take 1 tablet (20 mg total) by mouth every other day., Disp: 45 tablet, Rfl: 3 Allergies  Allergen Reactions   Bee Venom Anaphylaxis, Swelling and Other (See Comments)    Swells all over     Social History   Socioeconomic History   Marital status: Single    Spouse name: Not on file   Number of children: Not on file   Years of education: Not on file   Highest education level: Not on file  Occupational History   Not on file  Tobacco Use   Smoking status: Former    Current packs/day: 0.00    Types: Cigarettes    Quit date: 10/06/2017    Years since quitting: 6.0   Smokeless tobacco: Never  Substance and Sexual Activity   Alcohol use: Yes    Comment: Occasional Beer   Drug use: Yes    Types: Marijuana   Sexual activity: Not on file  Other Topics Concern   Not on file  Social History Narrative   Right Handed.    Lives in a two story home    Lives with 18 year old son.    Social  Drivers of Corporate investment banker Strain: Low Risk  (01/18/2022)   Overall Financial Resource Strain (CARDIA)    Difficulty of Paying Living Expenses: Not hard at all  Food Insecurity: No Food Insecurity (09/21/2022)   Hunger Vital Sign    Worried About Running Out of Food in the Last Year: Never true    Ran Out of Food in the Last Year: Never true  Transportation Needs: No Transportation Needs (06/04/2022)   PRAPARE - Administrator, Civil Service (Medical): No    Lack of Transportation (Non-Medical): No  Physical Activity: Inactive (01/18/2022)   Exercise Vital Sign  Days of Exercise per Week: 0 days    Minutes of Exercise per Session: 0 min  Stress: Stress Concern Present (01/18/2022)   [employer] of Occupational Health - Occupational Stress Questionnaire    Feeling of Stress : To some extent  Social Connections: Socially Isolated (01/18/2022)   Social Connection and Isolation Panel [NHANES]    Frequency of Communication with Friends and Family: More than three times a week    Frequency of Social Gatherings with Friends and Family: More than three times a week    Attends Religious Services: Never    Database administrator or Organizations: No    Attends Banker Meetings: Never    Marital Status: Never married  Intimate Partner Violence: Not At Risk (01/18/2022)   Humiliation, Afraid, Rape, and Kick questionnaire    Fear of Current or Ex-Partner: No    Emotionally Abused: No    Physically Abused: No    Sexually Abused: No    Physical Exam      No future appointments.

## 2023-10-18 ENCOUNTER — Telehealth: Payer: Self-pay | Admitting: Family Medicine

## 2023-10-18 NOTE — Telephone Encounter (Signed)
 Following up on cologuard. No results in chart. Called to check w/ pt. Per nursing notes he had received kit as of 5/15. LM to call if any questions and if had already completed, o/w to complete.

## 2023-10-25 ENCOUNTER — Other Ambulatory Visit: Payer: Self-pay

## 2023-10-25 ENCOUNTER — Other Ambulatory Visit (HOSPITAL_COMMUNITY): Payer: Self-pay

## 2023-10-26 ENCOUNTER — Other Ambulatory Visit (HOSPITAL_COMMUNITY): Payer: Self-pay | Admitting: Emergency Medicine

## 2023-10-26 ENCOUNTER — Other Ambulatory Visit (HOSPITAL_COMMUNITY): Payer: Self-pay | Admitting: Family Medicine

## 2023-10-26 DIAGNOSIS — E854 Organ-limited amyloidosis: Secondary | ICD-10-CM

## 2023-10-26 DIAGNOSIS — I5020 Unspecified systolic (congestive) heart failure: Secondary | ICD-10-CM

## 2023-10-26 NOTE — Progress Notes (Signed)
 Paramedicine Encounter    Patient ID: Thomas West, male    DOB: 12-29-1960, 63 y.o.   MRN: [id number]   Complaints NONE  Assessment A&O x 4, skin W&D w/ good color.  Denies chest pain or SOB.  Lung sounds clear throughout and no peripheral edema noted.  Compliance with meds YES  Pill box filled x 2 weeks  Refills needed Isosorbide , Rybelsus   Meds changes since last visit NONE    Social changes NONE   BP (!) 180/120 (BP Location: Right Arm, Patient Position: Sitting, Cuff Size: Normal)   Pulse 76   Resp 16   Wt 167 lb 3.2 oz (75.8 kg)   SpO2 96%   BMI 24.69 kg/m  Weight yesterday- not taken Last visit weight-165.6lb  Today pt is hypertensive.  Had a Gyro w/ barbeque sauce for dinner last night and had not taken his meds this morning.  Advised the gyro and barbeque sauce were high in sodium content.  He denies headache or visual disturbances.  No obvious neurological deficits noted.  He took his morning meds during our visit.  His Vyndamax  refill will be delivered tomorrow from [employer].  I will stop by and add these meds to his second box.  Also will recheck his BP. Med box reconciled x 2 weeks as pt is leaving for a vacation to [place]  next week with his son.  Encouraged him continue to be compliant w/ meds.  Watch nutritional choices and adhere to fluid restrictions.  He advises that he understands same.   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   Pruritic rash 05/25/2022   Polyneuropathy associated with underlying disease (HCC) 01/15/2022   Atherosclerosis of aorta  (HCC) 01/15/2022   History of CVA (cerebrovascular accident) without residual deficits 12/24/2021   AKI (acute kidney injury) (HCC) 12/13/2021   Diarrhea 12/13/2021   Shortness of breath 12/13/2021   Chest pain 12/13/2021   Nausea and vomiting 12/13/2021   Heart failure (HCC) 12/08/2021   Hypertension associated with diabetes (HCC) 07/12/2021   Chronic kidney disease, stage 3a (HCC) 07/12/2021   Hyperkalemia 07/12/2021   CHF exacerbation (HCC) 04/20/2021   HFrEF (heart failure with reduced ejection fraction) (HCC) 02/03/2021   Diabetes mellitus (HCC) 03/27/2019   Hyperlipidemia associated with type 2 diabetes mellitus (HCC) 03/27/2019   CAD (coronary artery disease) 03/05/2019   History of MI (myocardial infarction) 03/05/2019   Type 2 diabetes mellitus with diabetic neuropathy, unspecified (HCC) 03/05/2019   HLD (hyperlipidemia) 03/05/2019   GERD (gastroesophageal reflux disease) 03/05/2019   Hypertension, essential, benign 03/05/2019    Current Outpatient Medications:    albuterol  (VENTOLIN  HFA) 108 (90 Base) MCG/ACT inhaler, Inhale 1-2 puffs into the lungs every 6 (six) hours for 1 week, then as needed., Disp: 6.7 g, Rfl: 0   aspirin  EC 81 MG tablet, Take 1 tablet (81 mg total) by mouth daily. Swallow whole., Disp: 30 tablet, Rfl: 11   atorvastatin  (LIPITOR ) 80 MG tablet, Take 1 tablet (80 mg total) by mouth daily., Disp: 90 tablet, Rfl: 3   carvedilol  (COREG ) 12.5 MG tablet, Take 1 tablet (12.5 mg total) by mouth 2 (  two) times daily., Disp: 180 tablet, Rfl: 3   clopidogrel  (PLAVIX ) 75 MG tablet, Take 1 tablet (75 mg total) by mouth daily., Disp: 90 tablet, Rfl: 2   cyanocobalamin  (VITAMIN B12) 1000 MCG tablet, Take 1 tablet (1,000 mcg total) by mouth daily., Disp: 130 tablet, Rfl: 3   DULoxetine  (CYMBALTA ) 30 MG capsule, Take 1 capsule (30 mg total) by mouth daily., Disp: 90 capsule, Rfl: 2   empagliflozin  (JARDIANCE ) 25 MG TABS tablet, Take 1 tablet (25 mg total) by mouth daily  before breakfast., Disp: 90 tablet, Rfl: 2   ezetimibe  (ZETIA ) 10 MG tablet, Take 1 tablet (10 mg total) by mouth daily., Disp: 90 tablet, Rfl: 1   hydrALAZINE  (APRESOLINE ) 50 MG tablet, Take 2 tablets (100 mg total) by mouth 3 (three) times daily., Disp: 180 tablet, Rfl: 11   hydrocortisone  cream 1 %, Apply 1 Application topically 2 (two) times daily as needed for itching., Disp: 45 g, Rfl: 1   isosorbide  mononitrate (IMDUR ) 60 MG 24 hr tablet, Take 1.5 tablets (90 mg total) by mouth daily., Disp: 45 tablet, Rfl: 6   linagliptin  (TRADJENTA ) 5 MG TABS tablet, Take 1 tablet (5 mg total) by mouth daily., Disp: 30 tablet, Rfl: 3   sacubitril -valsartan  (ENTRESTO ) 97-103 MG, Take 1 tablet by mouth 2 (two) times daily., Disp: 60 tablet, Rfl: 3   Semaglutide  (RYBELSUS ) 3 MG TABS, Take 1 tablet (3 mg total) by mouth daily., Disp: 30 tablet, Rfl: 4   spironolactone  (ALDACTONE ) 25 MG tablet, Take 0.5 tablets (12.5 mg total) by mouth daily., Disp: 45 tablet, Rfl: 3   Tafamidis  (VYNDAMAX ) 61 MG CAPS, Take 1 capsule by mouth daily., Disp: 30 capsule, Rfl: 11   torsemide  (DEMADEX ) 20 MG tablet, Take 1 tablet (20 mg total) by mouth every other day., Disp: 45 tablet, Rfl: 3 Allergies  Allergen Reactions   Bee Venom Anaphylaxis, Swelling and Other (See Comments)    Swells all over     Social History   Socioeconomic History   Marital status: Single    Spouse name: Not on file   Number of children: Not on file   Years of education: Not on file   Highest education level: Not on file  Occupational History   Not on file  Tobacco Use   Smoking status: Former    Current packs/day: 0.00    Types: Cigarettes    Quit date: 10/06/2017    Years since quitting: 6.0   Smokeless tobacco: Never  Substance and Sexual Activity   Alcohol use: Yes    Comment: Occasional Beer   Drug use: Yes    Types: Marijuana   Sexual activity: Not on file  Other Topics Concern   Not on file  Social History Narrative   Right  Handed.    Lives in a two story home    Lives with 52 year old son.    Social Drivers of Corporate investment banker Strain: Low Risk  (01/18/2022)   Overall Financial Resource Strain (CARDIA)    Difficulty of Paying Living Expenses: Not hard at all  Food Insecurity: No Food Insecurity (09/21/2022)   Hunger Vital Sign    Worried About Running Out of Food in the Last Year: Never true    Ran Out of Food in the Last Year: Never true  Transportation Needs: No Transportation Needs (06/04/2022)   PRAPARE - Administrator, Civil Service (Medical): No    Lack of Transportation (Non-Medical): No  Physical  Activity: Inactive (01/18/2022)   Exercise Vital Sign    Days of Exercise per Week: 0 days    Minutes of Exercise per Session: 0 min  Stress: Stress Concern Present (01/18/2022)   [employer] of Occupational Health - Occupational Stress Questionnaire    Feeling of Stress : To some extent  Social Connections: Socially Isolated (01/18/2022)   Social Connection and Isolation Panel [NHANES]    Frequency of Communication with Friends and Family: More than three times a week    Frequency of Social Gatherings with Friends and Family: More than three times a week    Attends Religious Services: Never    Database administrator or Organizations: No    Attends Banker Meetings: Never    Marital Status: Never married  Intimate Partner Violence: Not At Risk (01/18/2022)   Humiliation, Afraid, Rape, and Kick questionnaire    Fear of Current or Ex-Partner: No    Emotionally Abused: No    Physically Abused: No    Sexually Abused: No    Physical Exam      No future appointments.

## 2023-10-27 ENCOUNTER — Other Ambulatory Visit (HOSPITAL_COMMUNITY): Payer: Self-pay

## 2023-10-27 MED ORDER — ISOSORBIDE MONONITRATE ER 60 MG PO TB24
90.0000 mg | ORAL_TABLET | Freq: Every day | ORAL | 3 refills | Status: AC
  Filled 2023-10-27: qty 180, 120d supply, fill #0
  Filled 2023-11-18: qty 135, 90d supply, fill #0
  Filled 2024-02-14 – 2024-02-24 (×2): qty 135, 90d supply, fill #1
  Filled 2024-03-14 – 2024-05-20 (×2): qty 135, 90d supply, fill #2

## 2023-10-28 ENCOUNTER — Other Ambulatory Visit (HOSPITAL_COMMUNITY): Payer: Self-pay

## 2023-11-02 ENCOUNTER — Other Ambulatory Visit (HOSPITAL_COMMUNITY): Payer: Self-pay

## 2023-11-03 ENCOUNTER — Other Ambulatory Visit (HOSPITAL_COMMUNITY): Payer: Self-pay

## 2023-11-04 ENCOUNTER — Other Ambulatory Visit (HOSPITAL_COMMUNITY): Payer: Self-pay

## 2023-11-09 ENCOUNTER — Other Ambulatory Visit (HOSPITAL_COMMUNITY): Payer: Self-pay | Admitting: Emergency Medicine

## 2023-11-09 NOTE — Progress Notes (Signed)
 Paramedicine Encounter    Patient ID: Thomas West, male    DOB: 1960/12/10, 63 y.o.   MRN: [id number]   Complaints NONE  Assessment A&O x 4, skin W&D w/ good color.  Denies chest pain or SOB.  Weight is up 6lbs.  Mild non-pitting edema noted to lower extremities bilat.  Compliance with meds  Missed 1 day of meds  Pill box filled x 1 week  Refills needed Rybelsus , Trajenta  Meds changes since last visit NONE    Social changes NONE   BP (!) 190/108 (BP Location: Left Arm, Patient Position: Sitting, Cuff Size: Normal)   Pulse 83   Resp 16   Wt 173 lb 6.4 oz (78.7 kg)   SpO2 98%   BMI 25.61 kg/m  Weight yesterday- not taken Last visit weight-167.2 lb  ATF Mr. Kinsella A&O x 4, skin W&D w/ good color.  Mr Asbridge has been visiting family in [place]  for a fishing trip w/ his son.  He states during his trip he did not do great with food choices.  He states he feels great  His BP is elevated today and his weight it up 6lbs from last visit which was 2 weeks ago.  He has some mild non-pitting edema to both lower extremities.  He denies chest pain or SOB.  No headache, or visual disturbances.  I advised him I will reach out to [facility] for possible orders due to elevated BP and weight gain.   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   Pruritic rash 05/25/2022   Polyneuropathy associated with underlying disease (HCC) 01/15/2022   Atherosclerosis of aorta (HCC) 01/15/2022   History of CVA (cerebrovascular accident) without residual deficits 12/24/2021   AKI (acute kidney injury) (HCC)  12/13/2021   Diarrhea 12/13/2021   Shortness of breath 12/13/2021   Chest pain 12/13/2021   Nausea and vomiting 12/13/2021   Heart failure (HCC) 12/08/2021   Hypertension associated with diabetes (HCC) 07/12/2021   Chronic kidney disease, stage 3a (HCC) 07/12/2021   Hyperkalemia 07/12/2021   CHF exacerbation (HCC) 04/20/2021   HFrEF (heart failure with reduced ejection fraction) (HCC) 02/03/2021   Diabetes mellitus (HCC) 03/27/2019   Hyperlipidemia associated with type 2 diabetes mellitus (HCC) 03/27/2019   CAD (coronary artery disease) 03/05/2019   History of MI (myocardial infarction) 03/05/2019   Type 2 diabetes mellitus with diabetic neuropathy, unspecified (HCC) 03/05/2019   HLD (hyperlipidemia) 03/05/2019   GERD (gastroesophageal reflux disease) 03/05/2019   Hypertension, essential, benign 03/05/2019    Current Outpatient Medications:    albuterol  (VENTOLIN  HFA) 108 (90 Base) MCG/ACT inhaler, Inhale 1-2 puffs into the lungs every 6 (six) hours for 1 week, then as needed., Disp: 6.7 g, Rfl: 0   aspirin  EC 81 MG tablet, Take 1 tablet (81 mg total) by mouth daily. Swallow whole., Disp: 30 tablet, Rfl: 11   atorvastatin  (LIPITOR ) 80 MG tablet, Take 1 tablet (80 mg total) by mouth daily., Disp: 90 tablet, Rfl: 3   carvedilol  (COREG ) 12.5 MG tablet, Take 1 tablet (12.5 mg total) by mouth 2 (two) times daily., Disp: 180 tablet, Rfl: 3   clopidogrel  (  PLAVIX ) 75 MG tablet, Take 1 tablet (75 mg total) by mouth daily., Disp: 90 tablet, Rfl: 2   DULoxetine  (CYMBALTA ) 30 MG capsule, Take 1 capsule (30 mg total) by mouth daily., Disp: 90 capsule, Rfl: 2   empagliflozin  (JARDIANCE ) 25 MG TABS tablet, Take 1 tablet (25 mg total) by mouth daily before breakfast., Disp: 90 tablet, Rfl: 2   ezetimibe  (ZETIA ) 10 MG tablet, Take 1 tablet (10 mg total) by mouth daily., Disp: 90 tablet, Rfl: 1   hydrALAZINE  (APRESOLINE ) 50 MG tablet, Take 2 tablets (100 mg total) by mouth 3 (three) times daily., Disp:  180 tablet, Rfl: 11   hydrocortisone  cream 1 %, Apply 1 Application topically 2 (two) times daily as needed for itching., Disp: 45 g, Rfl: 1   isosorbide  mononitrate (IMDUR ) 60 MG 24 hr tablet, Take 1.5 tablets (90 mg total) by mouth daily., Disp: 180 tablet, Rfl: 3   linagliptin  (TRADJENTA ) 5 MG TABS tablet, Take 1 tablet (5 mg total) by mouth daily., Disp: 30 tablet, Rfl: 3   sacubitril -valsartan  (ENTRESTO ) 97-103 MG, Take 1 tablet by mouth 2 (two) times daily., Disp: 60 tablet, Rfl: 3   Semaglutide  (RYBELSUS ) 3 MG TABS, Take 1 tablet (3 mg total) by mouth daily., Disp: 30 tablet, Rfl: 4   spironolactone  (ALDACTONE ) 25 MG tablet, Take 0.5 tablets (12.5 mg total) by mouth daily., Disp: 45 tablet, Rfl: 3   Tafamidis  (VYNDAMAX ) 61 MG CAPS, Take 1 capsule by mouth daily., Disp: 30 capsule, Rfl: 11   torsemide  (DEMADEX ) 20 MG tablet, Take 1 tablet (20 mg total) by mouth every other day., Disp: 45 tablet, Rfl: 3   cyanocobalamin  (VITAMIN B12) 1000 MCG tablet, Take 1 tablet (1,000 mcg total) by mouth daily., Disp: 130 tablet, Rfl: 3 Allergies  Allergen Reactions   Bee Venom Anaphylaxis, Swelling and Other (See Comments)    Swells all over     Social History   Socioeconomic History   Marital status: Single    Spouse name: Not on file   Number of children: Not on file   Years of education: Not on file   Highest education level: Not on file  Occupational History   Not on file  Tobacco Use   Smoking status: Former    Current packs/day: 0.00    Types: Cigarettes    Quit date: 10/06/2017    Years since quitting: 6.0   Smokeless tobacco: Never  Substance and Sexual Activity   Alcohol use: Yes    Comment: Occasional Beer   Drug use: Yes    Types: Marijuana   Sexual activity: Not on file  Other Topics Concern   Not on file  Social History Narrative   Right Handed.    Lives in a two story home    Lives with 36 year old son.    Social Drivers of Corporate investment banker Strain: Low  Risk  (01/18/2022)   Overall Financial Resource Strain (CARDIA)    Difficulty of Paying Living Expenses: Not hard at all  Food Insecurity: No Food Insecurity (09/21/2022)   Hunger Vital Sign    Worried About Running Out of Food in the Last Year: Never true    Ran Out of Food in the Last Year: Never true  Transportation Needs: No Transportation Needs (06/04/2022)   PRAPARE - Administrator, Civil Service (Medical): No    Lack of Transportation (Non-Medical): No  Physical Activity: Inactive (01/18/2022)   Exercise Vital Sign  Days of Exercise per Week: 0 days    Minutes of Exercise per Session: 0 min  Stress: Stress Concern Present (01/18/2022)   [employer] of Occupational Health - Occupational Stress Questionnaire    Feeling of Stress : To some extent  Social Connections: Socially Isolated (01/18/2022)   Social Connection and Isolation Panel    Frequency of Communication with Friends and Family: More than three times a week    Frequency of Social Gatherings with Friends and Family: More than three times a week    Attends Religious Services: Never    Database administrator or Organizations: No    Attends Banker Meetings: Never    Marital Status: Never married  Intimate Partner Violence: Not At Risk (01/18/2022)   Humiliation, Afraid, Rape, and Kick questionnaire    Fear of Current or Ex-Partner: No    Emotionally Abused: No    Physically Abused: No    Sexually Abused: No    Physical Exam      No future appointments.

## 2023-11-10 ENCOUNTER — Other Ambulatory Visit: Payer: Self-pay

## 2023-11-10 ENCOUNTER — Telehealth: Payer: Self-pay | Admitting: Family Medicine

## 2023-11-10 ENCOUNTER — Other Ambulatory Visit (HOSPITAL_COMMUNITY): Payer: Self-pay

## 2023-11-10 ENCOUNTER — Telehealth (HOSPITAL_COMMUNITY): Payer: Self-pay | Admitting: Cardiology

## 2023-11-10 DIAGNOSIS — I5022 Chronic systolic (congestive) heart failure: Secondary | ICD-10-CM

## 2023-11-10 NOTE — Telephone Encounter (Signed)
 LM for pt to let us  know if has completed cologuard or if not to do so and let us  know so that we can look for results.

## 2023-11-10 NOTE — Telephone Encounter (Signed)
-----   Message from CMA [name] S sent at 11/09/2023  9:56 PM EDT ----- Regarding: Elevated BP/weight gain Saw Mr. Forstrom today.  He's been on vacation for a couple of weeks.  Today his BP is elevated @ 190/108.  Weight is up 6lbs but this is in a 2 week span.  He does have some mild non-pitting edema.  He denies chest pain or SOB.  No headaches, dizziness or visual disturbances.  Please advise if any med changes desired.   Valorie Gearing, EMT-Paramedic [phone] 11/09/2023

## 2023-11-10 NOTE — Telephone Encounter (Signed)
-----   Message from Maurie Southern sent at 10/18/2023 12:49 PM EDT ----- Regarding: check on cologuard, pt had as of 5/15 [phone]

## 2023-11-10 NOTE — Telephone Encounter (Signed)
  I am responding this message left last night.   Please call and instruct to take 20 mg torsemide  daily.   BP uncontrolled. Add amlodipine 2.5 mg daily.   Please set up HF follow up appointment in 7 days with APP. He will need BMET at that time.    Owais Pruett NP-C  10:20 AM

## 2023-11-11 ENCOUNTER — Other Ambulatory Visit: Payer: Self-pay

## 2023-11-12 ENCOUNTER — Other Ambulatory Visit (HOSPITAL_COMMUNITY): Payer: Self-pay

## 2023-11-15 ENCOUNTER — Telehealth (HOSPITAL_COMMUNITY): Payer: Self-pay | Admitting: Emergency Medicine

## 2023-11-15 ENCOUNTER — Other Ambulatory Visit (HOSPITAL_COMMUNITY): Payer: Self-pay | Admitting: Emergency Medicine

## 2023-11-15 ENCOUNTER — Other Ambulatory Visit (HOSPITAL_COMMUNITY): Payer: Self-pay

## 2023-11-15 MED ORDER — TORSEMIDE 20 MG PO TABS
20.0000 mg | ORAL_TABLET | Freq: Every day | ORAL | 6 refills | Status: AC
  Filled 2023-11-15: qty 30, 30d supply, fill #0
  Filled 2023-12-28 – 2024-01-18 (×2): qty 30, 30d supply, fill #1
  Filled 2024-02-24 (×2): qty 30, 30d supply, fill #2
  Filled 2024-03-28: qty 30, 30d supply, fill #3
  Filled 2024-05-23: qty 30, 30d supply, fill #4
  Filled 2024-06-26: qty 30, 30d supply, fill #5

## 2023-11-15 MED ORDER — AMLODIPINE BESYLATE 5 MG PO TABS
2.5000 mg | ORAL_TABLET | Freq: Every day | ORAL | 3 refills | Status: AC
  Filled 2023-11-15: qty 30, 60d supply, fill #0
  Filled 2023-12-28 – 2024-01-07 (×2): qty 30, 60d supply, fill #1
  Filled 2024-02-14 – 2024-03-07 (×2): qty 30, 60d supply, fill #2
  Filled 2024-05-06: qty 30, 60d supply, fill #3

## 2023-11-15 NOTE — Progress Notes (Signed)
 Paramedicine Encounter    Patient ID: Thomas West, male    DOB: [birth year], 63 y.o.   MRN: [id number]   Complaints NONE  Assessment A&O x 4 skin W&D w/ good color.  Denies chest pain or SOB. Lung sounds clear throughout.  No edema noted.  Compliance with meds YES  Pill box filled x 2 weeks  Refills needed B12, Atorvastatin , Hydralazine , Torsemide , Torsemide   Meds changes since last visit Added 20mg . Torsemide  daily and 2.5mg  Amlodipine  daily.   Social changes NONE   BP (!) 166/90 (BP Location: Left Arm, Patient Position: Sitting, Cuff Size: Normal)   Pulse 92   Resp 16   Wt 166 lb 3.2 oz (75.4 kg)   SpO2 94%   BMI 24.54 kg/m  Weight yesterday-nokent ta Last visit weight-173.4lb   ACTION: Home visit completed  Thomas West [phone] 11/15/23  Patient Care Team: [name], [name], MD as PCP - General (Family Medicine) Bensimhon, Toribio SAUNDERS, MD as PCP - Cardiology (Cardiology) Thomas Tonita POUR, DO as Consulting Physician (Neurology) Thomas West, [facility] (Pharmacist) Thomas West, [facility] (Pharmacist)  Patient Active Problem List   Diagnosis Date Noted   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   Pruritic rash 05/25/2022   Polyneuropathy associated with underlying disease (HCC) 01/15/2022   Atherosclerosis of aorta (HCC) 01/15/2022   History of CVA (cerebrovascular accident) without residual deficits 12/24/2021   AKI (acute kidney injury) (HCC) 12/13/2021   Diarrhea 12/13/2021   Shortness of breath 12/13/2021   Chest pain 12/13/2021   Nausea and vomiting 12/13/2021   Heart failure (HCC) 12/08/2021   Hypertension associated with diabetes (HCC) 07/12/2021   Chronic kidney disease, stage 3a (HCC) 07/12/2021   Hyperkalemia 07/12/2021   CHF exacerbation (HCC) 04/20/2021   HFrEF (heart failure with reduced ejection fraction) (HCC) 02/03/2021   Diabetes mellitus (HCC) 03/27/2019   Hyperlipidemia associated with type 2 diabetes mellitus (HCC) 03/27/2019    CAD (coronary artery disease) 03/05/2019   History of MI (myocardial infarction) 03/05/2019   Type 2 diabetes mellitus with diabetic neuropathy, unspecified (HCC) 03/05/2019   HLD (hyperlipidemia) 03/05/2019   GERD (gastroesophageal reflux disease) 03/05/2019   Hypertension, essential, benign 03/05/2019    Current Outpatient Medications:    albuterol  (VENTOLIN  HFA) 108 (90 Base) MCG/ACT inhaler, Inhale 1-2 puffs into the lungs every 6 (six) hours for 1 week, then as needed., Disp: 6.7 West, Rfl: 0   aspirin  EC 81 MG tablet, Take 1 tablet (81 mg total) by mouth daily. Swallow whole., Disp: 30 tablet, Rfl: 11   atorvastatin  (LIPITOR ) 80 MG tablet, Take 1 tablet (80 mg total) by mouth daily., Disp: 90 tablet, Rfl: 3   carvedilol  (COREG ) 12.5 MG tablet, Take 1 tablet (12.5 mg total) by mouth 2 (two) times daily., Disp: 180 tablet, Rfl: 3   clopidogrel  (PLAVIX ) 75 MG tablet, Take 1 tablet (75 mg total) by mouth daily., Disp: 90 tablet, Rfl: 2   cyanocobalamin  (VITAMIN B12) 1000 MCG tablet, Take 1 tablet (1,000 mcg total) by mouth daily., Disp: 130 tablet, Rfl: 3   DULoxetine  (CYMBALTA ) 30 MG capsule, Take 1 capsule (30 mg total) by mouth daily., Disp: 90 capsule, Rfl: 2   empagliflozin  (JARDIANCE ) 25 MG TABS tablet, Take 1 tablet (25 mg total) by mouth daily before breakfast., Disp: 90 tablet, Rfl: 2   ezetimibe  (ZETIA ) 10 MG tablet, Take 1 tablet (10 mg total) by mouth daily., Disp: 90 tablet, Rfl: 1   hydrALAZINE  (APRESOLINE ) 50 MG tablet, Take 2 tablets (100 mg  total) by mouth 3 (three) times daily., Disp: 180 tablet, Rfl: 11   hydrocortisone  cream 1 %, Apply 1 Application topically 2 (two) times daily as needed for itching., Disp: 45 West, Rfl: 1   isosorbide  mononitrate (IMDUR ) 60 MG 24 hr tablet, Take 1.5 tablets (90 mg total) by mouth daily., Disp: 180 tablet, Rfl: 3   linagliptin  (TRADJENTA ) 5 MG TABS tablet, Take 1 tablet (5 mg total) by mouth daily., Disp: 30 tablet, Rfl: 3    sacubitril -valsartan  (ENTRESTO ) 97-103 MG, Take 1 tablet by mouth 2 (two) times daily., Disp: 60 tablet, Rfl: 3   Semaglutide  (RYBELSUS ) 3 MG TABS, Take 1 tablet (3 mg total) by mouth daily., Disp: 30 tablet, Rfl: 4   spironolactone  (ALDACTONE ) 25 MG tablet, Take 0.5 tablets (12.5 mg total) by mouth daily., Disp: 45 tablet, Rfl: 3   Tafamidis  (VYNDAMAX ) 61 MG CAPS, Take 1 capsule by mouth daily., Disp: 30 capsule, Rfl: 11   torsemide  (DEMADEX ) 20 MG tablet, Take 1 tablet (20 mg total) by mouth every other day., Disp: 45 tablet, Rfl: 3 Allergies  Allergen Reactions   Bee Venom Anaphylaxis, Swelling and Other (See Comments)    Swells all over     Social History   Socioeconomic History   Marital status: Single    Spouse name: Not on file   Number of children: Not on file   Years of education: Not on file   Highest education level: Not on file  Occupational History   Not on file  Tobacco Use   Smoking status: Former    Current packs/day: 0.00    Types: Cigarettes    Quit date: 10/06/2017    Years since quitting: 6.1   Smokeless tobacco: Never  Substance and Sexual Activity   Alcohol use: Yes    Comment: Occasional Beer   Drug use: Yes    Types: Marijuana   Sexual activity: Not on file  Other Topics Concern   Not on file  Social History Narrative   Right Handed.    Lives in a two story home    Lives with 54 year old son.    Social Drivers of Corporate investment banker Strain: Low Risk  (01/18/2022)   Overall Financial Resource Strain (CARDIA)    Difficulty of Paying Living Expenses: Not hard at all  Food Insecurity: No Food Insecurity (09/21/2022)   Hunger Vital Sign    Worried About Running Out of Food in the Last Year: Never true    Ran Out of Food in the Last Year: Never true  Transportation Needs: No Transportation Needs (06/04/2022)   PRAPARE - Administrator, Civil Service (Medical): No    Lack of Transportation (Non-Medical): No  Physical Activity:  Inactive (01/18/2022)   Exercise Vital Sign    Days of Exercise per Week: 0 days    Minutes of Exercise per Session: 0 min  Stress: Stress Concern Present (01/18/2022)   [employer] of Occupational Health - Occupational Stress Questionnaire    Feeling of Stress : To some extent  Social Connections: Socially Isolated (01/18/2022)   Social Connection and Isolation Panel    Frequency of Communication with Friends and Family: More than three times a week    Frequency of Social Gatherings with Friends and Family: More than three times a week    Attends Religious Services: Never    Database administrator or Organizations: No    Attends Banker Meetings: Never  Marital Status: Never married  Intimate Partner Violence: Not At Risk (01/18/2022)   Humiliation, Afraid, Rape, and Kick questionnaire    Fear of Current or Ex-Partner: No    Emotionally Abused: No    Physically Abused: No    Sexually Abused: No    Physical Exam      No future appointments.

## 2023-11-15 NOTE — Addendum Note (Signed)
 Addended by: BUELL POWELL HERO on: 11/15/2023 02:06 PM   Modules accepted: Orders

## 2023-11-15 NOTE — Telephone Encounter (Signed)
 Called and spoke with Mr. Stamey.  He states he's out shopping with his sister.  I advised him to call me when he gets home and I will come out and make med changes per Amy Clegg from Friday - Torsemide  20mg  daily and Amlodipine 2.5mg . daily.     [name], EMT-Paramedic [phone] 11/15/2023

## 2023-11-15 NOTE — Telephone Encounter (Signed)
 [name] community paramedic aware and will see pt today to adjust meds accordingly, med list updated

## 2023-11-16 ENCOUNTER — Other Ambulatory Visit (HOSPITAL_COMMUNITY): Payer: Self-pay

## 2023-11-16 ENCOUNTER — Telehealth: Payer: Self-pay

## 2023-11-16 NOTE — Telephone Encounter (Signed)
 Pharmacy Patient Advocate Encounter   Received notification from Onbase that prior authorization for Tradjenta  5MG  tablets is required/requested.   Insurance verification completed.   The patient is insured through [facility] .   Per test claim: PA required and submitted KEY/EOC/Request #: [id number] from 08/23/23 to 11/14/24. Ran test claim, Copay is $0.00. This test claim was processed through [facility]- copay amounts may vary at other pharmacies due to pharmacy/plan contracts, or as the patient moves through the different stages of their insurance plan.

## 2023-11-17 ENCOUNTER — Other Ambulatory Visit (HOSPITAL_COMMUNITY): Payer: Self-pay

## 2023-11-17 ENCOUNTER — Telehealth: Payer: Self-pay | Admitting: Family Medicine

## 2023-11-17 ENCOUNTER — Other Ambulatory Visit: Payer: Self-pay

## 2023-11-17 NOTE — Telephone Encounter (Signed)
-----   Message from Chiquita JONELLE Cramp sent at 11/10/2023 10:18 AM EDT ----- Regarding: check on cologuard, pt had as of 5/15, called and LM 6/19  ----- Message ----- From: Cramp Chiquita JONELLE, DO Sent: 11/01/2023  12:00 AM EDT To: Chiquita JONELLE Cramp, DO Subject: check on cologuard, pt had as of 5/15          [phone]

## 2023-11-17 NOTE — Telephone Encounter (Signed)
 Called 6/26. Unable to reach, left message encouraging pt to call us  to update on status of cologuard as I do not see results in the system.

## 2023-11-18 ENCOUNTER — Other Ambulatory Visit: Payer: Self-pay

## 2023-11-18 ENCOUNTER — Other Ambulatory Visit (HOSPITAL_COMMUNITY): Payer: Self-pay

## 2023-11-18 NOTE — Progress Notes (Signed)
 Specialty Pharmacy Refill Coordination Note  Spoke with [name] (Caretaker).   Thomas West is a 63 y.o. male contacted today regarding refills of specialty medication(s) Tafamidis  (Vyndamax )  Patient requested: Delivery   Delivery date: 11/22/23   Verified address: [address]  [place] [address]  Medication will be filled on 11/21/23.

## 2023-11-22 ENCOUNTER — Telehealth (HOSPITAL_COMMUNITY): Payer: Self-pay | Admitting: Emergency Medicine

## 2023-11-22 NOTE — Telephone Encounter (Signed)
 Called and spoke with Thomas West to remind him of Lab appointment in the morning @ 9:30 at the heart failure clinic.  He advises he will be there.    [name], EMT-Paramedic [phone] 11/22/2023

## 2023-11-23 ENCOUNTER — Ambulatory Visit (HOSPITAL_COMMUNITY): Payer: Self-pay | Admitting: Adult Health

## 2023-11-23 ENCOUNTER — Ambulatory Visit (HOSPITAL_COMMUNITY)
Admission: RE | Admit: 2023-11-23 | Discharge: 2023-11-23 | Disposition: A | Discharge status: Home / Self Care | Source: Ambulatory Visit | Attending: Cardiology | Admitting: Cardiology

## 2023-11-23 ENCOUNTER — Telehealth (HOSPITAL_COMMUNITY): Payer: Self-pay

## 2023-11-23 DIAGNOSIS — I5022 Chronic systolic (congestive) heart failure: Secondary | ICD-10-CM | POA: Diagnosis present

## 2023-11-23 LAB — BASIC METABOLIC PANEL WITH GFR
Anion gap: 11 (ref 5–15)
BUN: 12 mg/dL (ref 6–20)
CO2: 25 mmol/L (ref 22–32)
Calcium: 8.8 mg/dL — ABNORMAL LOW (ref 8.9–10.3)
Chloride: 103 mmol/L (ref 98–111)
Creatinine, Ser: 1.53 mg/dL — ABNORMAL HIGH (ref 0.61–1.24)
GFR, Estimated: 52 mL/min — ABNORMAL LOW (ref >60)
Glucose, Bld: 197 mg/dL — ABNORMAL HIGH (ref 70–99)
Potassium: 4.5 mmol/L (ref 3.5–5.1)
Sodium: 139 mmol/L (ref 135–145)

## 2023-11-23 NOTE — Telephone Encounter (Signed)
 error

## 2023-11-30 ENCOUNTER — Other Ambulatory Visit (HOSPITAL_COMMUNITY): Payer: Self-pay | Admitting: Emergency Medicine

## 2023-11-30 NOTE — Progress Notes (Unsigned)
 Paramedicine Encounter    Patient ID: Thomas West, male    DOB: 1960-11-19, 63 y.o.   MRN: [id number]   Complaints***  Assessment***  Compliance with meds***  Pill box filled***  Refills needed***  Meds changes since last visit NONE    Social changes NONE   There were no vitals taken for this visit. Weight yesterday-not taken Last visit weight-166.2lb  ACTION: {Paramed Action:[phone]}  [name], EMT-Paramedic [phone] 11/30/23  Patient Care Team: [name], [name], MD as PCP - General (Family Medicine) Bensimhon, Toribio SAUNDERS, MD as PCP - Cardiology (Cardiology) Tobie Tonita POUR, DO as Consulting Physician (Neurology) Lionell Jon DEL, [facility] (Pharmacist) Lionell Jon DEL, [facility] (Pharmacist)  Patient Active Problem List   Diagnosis Date Noted  . 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 Pruritic rash 05/25/2022  . Polyneuropathy associated with underlying disease (HCC) 01/15/2022  . Atherosclerosis of aorta (HCC) 01/15/2022  . History of CVA (cerebrovascular accident) without residual deficits 12/24/2021  . AKI (acute kidney injury) (HCC) 12/13/2021  . Diarrhea 12/13/2021  . Shortness of breath 12/13/2021  . Chest pain 12/13/2021  . Nausea and vomiting 12/13/2021  . Heart failure (HCC) 12/08/2021  . Hypertension associated with diabetes (HCC) 07/12/2021  . Chronic kidney disease, stage 3a (HCC) 07/12/2021  . Hyperkalemia 07/12/2021  . CHF exacerbation (HCC) 04/20/2021  . HFrEF (heart failure with reduced ejection fraction) (HCC) 02/03/2021  . Diabetes mellitus (HCC) 03/27/2019  . Hyperlipidemia associated with type 2 diabetes mellitus (HCC) 03/27/2019  . CAD (coronary artery disease) 03/05/2019  . History of MI (myocardial infarction) 03/05/2019  . Type 2 diabetes mellitus with diabetic neuropathy, unspecified (HCC) 03/05/2019  . HLD (hyperlipidemia) 03/05/2019  . GERD (gastroesophageal reflux disease) 03/05/2019  . Hypertension, essential, benign  03/05/2019    Current Outpatient Medications:  .  albuterol  (VENTOLIN  HFA) 108 (90 Base) MCG/ACT inhaler, Inhale 1-2 puffs into the lungs every 6 (six) hours for 1 week, then as needed., Disp: 6.7 g, Rfl: 0 .  amLODipine  (NORVASC ) 5 MG tablet, Take 0.5 tablets (2.5 mg total) by mouth daily., Disp: 30 tablet, Rfl: 3 .  aspirin  EC 81 MG tablet, Take 1 tablet (81 mg total) by mouth daily. Swallow whole., Disp: 30 tablet, Rfl: 11 .  atorvastatin  (LIPITOR ) 80 MG tablet, Take 1 tablet (80 mg total) by mouth daily., Disp: 90 tablet, Rfl: 3 .  carvedilol  (COREG ) 12.5 MG tablet, Take 1 tablet (12.5 mg total) by mouth 2 (two) times daily., Disp: 180 tablet, Rfl: 3 .  clopidogrel  (PLAVIX ) 75 MG tablet, Take 1 tablet (75 mg total) by mouth daily., Disp: 90 tablet, Rfl: 2 .  cyanocobalamin  (VITAMIN B12) 1000 MCG tablet, Take 1 tablet (1,000 mcg total) by mouth daily. (Patient not taking: Reported on 11/15/2023), Disp: 130 tablet, Rfl: 3 .  DULoxetine  (CYMBALTA ) 30 MG capsule, Take 1 capsule (30 mg total) by mouth daily., Disp: 90 capsule, Rfl: 2 .  empagliflozin  (JARDIANCE ) 25 MG TABS tablet, Take 1 tablet (25 mg total) by mouth daily before breakfast., Disp: 90 tablet, Rfl: 2 .  ezetimibe  (ZETIA ) 10 MG tablet, Take 1 tablet (10 mg total) by mouth daily., Disp: 90 tablet, Rfl: 1 .  hydrALAZINE  (APRESOLINE ) 50 MG tablet, Take 2 tablets (100 mg total) by mouth 3 (three) times daily., Disp: 180 tablet, Rfl: 11 .  hydrocortisone  cream 1 %, Apply 1 Application topically 2 (two) times daily as needed for itching., Disp: 45 g, Rfl: 1 .  isosorbide  mononitrate (IMDUR ) 60 MG 24 hr tablet,  Take 1.5 tablets (90 mg total) by mouth daily., Disp: 180 tablet, Rfl: 3 .  linagliptin  (TRADJENTA ) 5 MG TABS tablet, Take 1 tablet (5 mg total) by mouth daily., Disp: 30 tablet, Rfl: 3 .  sacubitril -valsartan  (ENTRESTO ) 97-103 MG, Take 1 tablet by mouth 2 (two) times daily., Disp: 60 tablet, Rfl: 3 .  Semaglutide  (RYBELSUS ) 3 MG TABS,  Take 1 tablet (3 mg total) by mouth daily., Disp: 30 tablet, Rfl: 4 .  spironolactone  (ALDACTONE ) 25 MG tablet, Take 0.5 tablets (12.5 mg total) by mouth daily., Disp: 45 tablet, Rfl: 3 .  Tafamidis  (VYNDAMAX ) 61 MG CAPS, Take 1 capsule by mouth daily., Disp: 30 capsule, Rfl: 11 .  torsemide  (DEMADEX ) 20 MG tablet, Take 1 tablet (20 mg total) by mouth daily., Disp: 30 tablet, Rfl: 6 Allergies  Allergen Reactions  . Bee Venom Anaphylaxis, Swelling and Other (See Comments)    Swells all over     Social History   Socioeconomic History  . Marital status: Single    Spouse name: Not on file  . Number of children: Not on file  . Years of education: Not on file  . Highest education level: Not on file  Occupational History  . Not on file  Tobacco Use  . Smoking status: Former    Current packs/day: 0.00    Types: Cigarettes    Quit date: 10/06/2017    Years since quitting: 6.1  . Smokeless tobacco: Never  Substance and Sexual Activity  . Alcohol use: Yes    Comment: Occasional Beer  . Drug use: Yes    Types: Marijuana  . Sexual activity: Not on file  Other Topics Concern  . Not on file  Social History Narrative   Right Handed.    Lives in a two story home    Lives with 3 year old son.    Social Drivers of Corporate investment banker Strain: Low Risk  (01/18/2022)   Overall Financial Resource Strain (CARDIA)   . Difficulty of Paying Living Expenses: Not hard at all  Food Insecurity: No Food Insecurity (09/21/2022)   Hunger Vital Sign   . Worried About Programme researcher, broadcasting/film/video in the Last Year: Never true   . Ran Out of Food in the Last Year: Never true  Transportation Needs: No Transportation Needs (06/04/2022)   PRAPARE - Transportation   . Lack of Transportation (Medical): No   . Lack of Transportation (Non-Medical): No  Physical Activity: Inactive (01/18/2022)   Exercise Vital Sign   . Days of Exercise per Week: 0 days   . Minutes of Exercise per Session: 0 min  Stress:  Stress Concern Present (01/18/2022)   [employer] of Occupational Health - Occupational Stress Questionnaire   . Feeling of Stress : To some extent  Social Connections: Socially Isolated (01/18/2022)   Social Connection and Isolation Panel   . Frequency of Communication with Friends and Family: More than three times a week   . Frequency of Social Gatherings with Friends and Family: More than three times a week   . Attends Religious Services: Never   . Active Member of Clubs or Organizations: No   . Attends Banker Meetings: Never   . Marital Status: Never married  Intimate Partner Violence: Not At Risk (01/18/2022)   Humiliation, Afraid, Rape, and Kick questionnaire   . Fear of Current or Ex-Partner: No   . Emotionally Abused: No   . Physically Abused: No   . Sexually Abused:  No    Physical Exam      No future appointments.

## 2023-12-12 ENCOUNTER — Other Ambulatory Visit: Payer: Self-pay | Admitting: Family Medicine

## 2023-12-12 ENCOUNTER — Other Ambulatory Visit (HOSPITAL_COMMUNITY): Payer: Self-pay

## 2023-12-12 ENCOUNTER — Other Ambulatory Visit: Payer: Self-pay

## 2023-12-12 MED ORDER — LINAGLIPTIN 5 MG PO TABS
5.0000 mg | ORAL_TABLET | Freq: Every day | ORAL | 0 refills | Status: DC
  Filled 2023-12-12: qty 30, 30d supply, fill #0

## 2023-12-14 ENCOUNTER — Telehealth (HOSPITAL_COMMUNITY): Payer: Self-pay | Admitting: Emergency Medicine

## 2023-12-14 NOTE — Telephone Encounter (Signed)
 Spoke w/ Thomas West regarding home visit.  He states that he is still in [place]  celebrating his son's 16th birthday.  He originally was supposed to be home on 7/23 but tells me that he will not be home until 7/24.  I advised him that I will be out of the office on the 24th & 25th and either Heather or Izetta will reach out to f/u on med reconciliation.    [name], EMT-Paramedic [phone] 12/14/2023

## 2023-12-16 ENCOUNTER — Telehealth (HOSPITAL_COMMUNITY): Payer: Self-pay

## 2023-12-16 NOTE — Telephone Encounter (Signed)
 Spoke to [name] who confirmed a home visit for Monday at 1000. Call complete.   Powell Mirza, EMT-Paramedic [phone] 12/16/2023

## 2023-12-19 ENCOUNTER — Other Ambulatory Visit (HOSPITAL_COMMUNITY): Payer: Self-pay

## 2023-12-19 NOTE — Progress Notes (Signed)
 Paramedicine Encounter    Patient ID: Thomas West, male    DOB: [birth year], 63 y.o.   MRN: [id number]   Complaints- none   Assessment- CAOX4, warm and dry reporting to be feeling okay today with no complaints. Lungs clear. No pitting edema noted. Vitals noted- BP elevated.   Compliance with meds- has missed several days due to being out of town and not being able to coordinate a paramedicine visit   Pill box filled- for two weeks   Refills needed-  Rybelsus  Torsemide  Zofran   Albuterol    Meds changes since last visit- none     Social changes- reports a cousin died of cancer yesterday- will not be traveling to [place] for funeral.    VISIT SUMMARY- Arrived for home visit for Thomas West who reports to be feeling okay today. He denied any complaints. Assessment and vitals as noted. BP elevated- he has been without meds for several days. I reviewed meds and filled pill box for two weeks. He denied any chest pain, dizziness or shortness of breath. Lungs clear. No edema. No rapid or significant weight gain. Refills as noted will be called into [facility] pharmacy. Home visit complete. [name] will follow up in two weeks.   (Pill box #2 missing Rybelsus  for fri, sat, sun morning)   BP (!) 180/100   Pulse 90   Resp 16   Wt 171 lb 3.2 oz (77.7 kg)   SpO2 95%   BMI 25.28 kg/m  Weight yesterday-- didn't weigh  Last visit weight-- 170lbs      ACTION: Home visit completed     Patient Care Team: [name], [name], MD as PCP - General (Family Medicine) Bensimhon, Toribio SAUNDERS, MD as PCP - Cardiology (Cardiology) Tobie Tonita POUR, DO as Consulting Physician (Neurology) Lionell Jon DEL, [facility] (Pharmacist) Lionell Jon DEL, [facility] (Pharmacist)  Patient Active Problem List   Diagnosis Date Noted   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   Pruritic rash 05/25/2022   Polyneuropathy associated with underlying disease (HCC) 01/15/2022   Atherosclerosis of aorta (HCC) 01/15/2022   History of CVA  (cerebrovascular accident) without residual deficits 12/24/2021   AKI (acute kidney injury) (HCC) 12/13/2021   Diarrhea 12/13/2021   Shortness of breath 12/13/2021   Chest pain 12/13/2021   Nausea and vomiting 12/13/2021   Heart failure (HCC) 12/08/2021   Hypertension associated with diabetes (HCC) 07/12/2021   Chronic kidney disease, stage 3a (HCC) 07/12/2021   Hyperkalemia 07/12/2021   CHF exacerbation (HCC) 04/20/2021   HFrEF (heart failure with reduced ejection fraction) (HCC) 02/03/2021   Diabetes mellitus (HCC) 03/27/2019   Hyperlipidemia associated with type 2 diabetes mellitus (HCC) 03/27/2019   CAD (coronary artery disease) 03/05/2019   History of MI (myocardial infarction) 03/05/2019   Type 2 diabetes mellitus with diabetic neuropathy, unspecified (HCC) 03/05/2019   HLD (hyperlipidemia) 03/05/2019   GERD (gastroesophageal reflux disease) 03/05/2019   Hypertension, essential, benign 03/05/2019    Current Outpatient Medications:    albuterol  (VENTOLIN  HFA) 108 (90 Base) MCG/ACT inhaler, Inhale 1-2 puffs into the lungs every 6 (six) hours for 1 week, then as needed., Disp: 6.7 g, Rfl: 0   amLODipine  (NORVASC ) 5 MG tablet, Take 0.5 tablets (2.5 mg total) by mouth daily., Disp: 30 tablet, Rfl: 3   aspirin  EC 81 MG tablet, Take 1 tablet (81 mg total) by mouth daily. Swallow whole., Disp: 30 tablet, Rfl: 11   atorvastatin  (LIPITOR ) 80 MG tablet, Take 1 tablet (80 mg total) by mouth daily., Disp: 90 tablet, Rfl:  3   carvedilol  (COREG ) 12.5 MG tablet, Take 1 tablet (12.5 mg total) by mouth 2 (two) times daily., Disp: 180 tablet, Rfl: 3   clopidogrel  (PLAVIX ) 75 MG tablet, Take 1 tablet (75 mg total) by mouth daily., Disp: 90 tablet, Rfl: 2   cyanocobalamin  (VITAMIN B12) 1000 MCG tablet, Take 1 tablet (1,000 mcg total) by mouth daily., Disp: 130 tablet, Rfl: 3   DULoxetine  (CYMBALTA ) 30 MG capsule, Take 1 capsule (30 mg total) by mouth daily., Disp: 90 capsule, Rfl: 2   empagliflozin   (JARDIANCE ) 25 MG TABS tablet, Take 1 tablet (25 mg total) by mouth daily before breakfast., Disp: 90 tablet, Rfl: 2   ezetimibe  (ZETIA ) 10 MG tablet, Take 1 tablet (10 mg total) by mouth daily., Disp: 90 tablet, Rfl: 1   hydrALAZINE  (APRESOLINE ) 50 MG tablet, Take 2 tablets (100 mg total) by mouth 3 (three) times daily., Disp: 180 tablet, Rfl: 11   hydrocortisone  cream 1 %, Apply 1 Application topically 2 (two) times daily as needed for itching., Disp: 45 g, Rfl: 1   isosorbide  mononitrate (IMDUR ) 60 MG 24 hr tablet, Take 1.5 tablets (90 mg total) by mouth daily., Disp: 180 tablet, Rfl: 3   linagliptin  (TRADJENTA ) 5 MG TABS tablet, Take 1 tablet (5 mg total) by mouth daily., Disp: 30 tablet, Rfl: 0   sacubitril -valsartan  (ENTRESTO ) 97-103 MG, Take 1 tablet by mouth 2 (two) times daily., Disp: 60 tablet, Rfl: 3   Semaglutide  (RYBELSUS ) 3 MG TABS, Take 1 tablet (3 mg total) by mouth daily., Disp: 30 tablet, Rfl: 4   spironolactone  (ALDACTONE ) 25 MG tablet, Take 0.5 tablets (12.5 mg total) by mouth daily., Disp: 45 tablet, Rfl: 3   Tafamidis  (VYNDAMAX ) 61 MG CAPS, Take 1 capsule by mouth daily., Disp: 30 capsule, Rfl: 11   torsemide  (DEMADEX ) 20 MG tablet, Take 1 tablet (20 mg total) by mouth daily., Disp: 30 tablet, Rfl: 6 Allergies  Allergen Reactions   Bee Venom Anaphylaxis, Swelling and Other (See Comments)    Swells all over     Social History   Socioeconomic History   Marital status: Single    Spouse name: Not on file   Number of children: Not on file   Years of education: Not on file   Highest education level: Not on file  Occupational History   Not on file  Tobacco Use   Smoking status: Former    Current packs/day: 0.00    Types: Cigarettes    Quit date: 10/06/2017    Years since quitting: 6.2   Smokeless tobacco: Never  Substance and Sexual Activity   Alcohol use: Yes    Comment: Occasional Beer   Drug use: Yes    Types: Marijuana   Sexual activity: Not on file  Other  Topics Concern   Not on file  Social History Narrative   Right Handed.    Lives in a two story home    Lives with 83 year old son.    Social Drivers of Corporate investment banker Strain: Low Risk  (01/18/2022)   Overall Financial Resource Strain (CARDIA)    Difficulty of Paying Living Expenses: Not hard at all  Food Insecurity: No Food Insecurity (09/21/2022)   Hunger Vital Sign    Worried About Running Out of Food in the Last Year: Never true    Ran Out of Food in the Last Year: Never true  Transportation Needs: No Transportation Needs (06/04/2022)   PRAPARE - Transportation  Lack of Transportation (Medical): No    Lack of Transportation (Non-Medical): No  Physical Activity: Inactive (01/18/2022)   Exercise Vital Sign    Days of Exercise per Week: 0 days    Minutes of Exercise per Session: 0 min  Stress: Stress Concern Present (01/18/2022)   [employer] of Occupational Health - Occupational Stress Questionnaire    Feeling of Stress : To some extent  Social Connections: Socially Isolated (01/18/2022)   Social Connection and Isolation Panel    Frequency of Communication with Friends and Family: More than three times a week    Frequency of Social Gatherings with Friends and Family: More than three times a week    Attends Religious Services: Never    Database administrator or Organizations: No    Attends Banker Meetings: Never    Marital Status: Never married  Intimate Partner Violence: Not At Risk (01/18/2022)   Humiliation, Afraid, Rape, and Kick questionnaire    Fear of Current or Ex-Partner: No    Emotionally Abused: No    Physically Abused: No    Sexually Abused: No    Physical Exam      No future appointments.

## 2023-12-21 ENCOUNTER — Other Ambulatory Visit (HOSPITAL_COMMUNITY): Payer: Self-pay | Admitting: Internal Medicine

## 2023-12-21 ENCOUNTER — Other Ambulatory Visit: Payer: Self-pay

## 2023-12-21 NOTE — Progress Notes (Signed)
 Specialty Pharmacy Refill Coordination Note  Thomas West is a 63 y.o. male, [name] was contacted today regarding refills of specialty medication(s) Tafamidis  (Vyndamax )   Patient requested Delivery   Delivery date: 12/26/23   Verified address: [address]  [place] [address]   Medication will be filled on 12/23/23.   This fill date is pending response to refill request from provider. Patient is aware and if they have not received fill by intended date they must follow up with pharmacy.

## 2023-12-22 ENCOUNTER — Other Ambulatory Visit: Payer: Self-pay

## 2023-12-22 MED ORDER — VYNDAMAX 61 MG PO CAPS
61.0000 mg | ORAL_CAPSULE | Freq: Every day | ORAL | 11 refills | Status: AC
  Filled 2023-12-22: qty 30, 30d supply, fill #0
  Filled 2024-01-16: qty 30, 30d supply, fill #1
  Filled 2024-02-15: qty 30, 30d supply, fill #2
  Filled 2024-03-09 – 2024-03-14 (×3): qty 30, 30d supply, fill #3
  Filled 2024-04-12 (×2): qty 30, 30d supply, fill #4
  Filled 2024-05-16: qty 30, 30d supply, fill #5
  Filled 2024-06-14 – 2024-06-22 (×2): qty 30, 30d supply, fill #6

## 2023-12-28 ENCOUNTER — Other Ambulatory Visit: Payer: Self-pay

## 2023-12-28 ENCOUNTER — Other Ambulatory Visit (HOSPITAL_COMMUNITY): Payer: Self-pay

## 2023-12-28 ENCOUNTER — Other Ambulatory Visit (HOSPITAL_COMMUNITY): Payer: Self-pay | Admitting: Emergency Medicine

## 2023-12-28 NOTE — Progress Notes (Unsigned)
 Paramedicine Encounter    Patient ID: Thomas West, male    DOB: 1960-06-21, 63 y.o.   MRN: [id number]   Complaints***  Assessment***  Compliance with meds***  Pill box filled***  Refills needed Amlodopine, Hydralazine , Hydralazine , Torsemide   Meds changes since last visit***    Social changes***   There were no vitals taken for this visit. Weight yesterday-*** Last visit weight-***  ACTION: {Paramed Action:[phone]}  [name], EMT-Paramedic [phone] 12/28/23  Patient Care Team: [name], [name], MD as PCP - General (Family Medicine) Bensimhon, Toribio SAUNDERS, MD as PCP - Cardiology (Cardiology) Tobie Tonita POUR, DO as Consulting Physician (Neurology) Lionell Jon DEL, [facility] (Pharmacist) Lionell Jon DEL, [facility] (Pharmacist)  Patient Active Problem List   Diagnosis Date Noted  . 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 Pruritic rash 05/25/2022  . Polyneuropathy associated with underlying disease (HCC) 01/15/2022  . Atherosclerosis of aorta (HCC) 01/15/2022  . History of CVA (cerebrovascular accident) without residual deficits 12/24/2021  . AKI (acute kidney injury) (HCC) 12/13/2021  . Diarrhea 12/13/2021  . Shortness of breath 12/13/2021  . Chest pain 12/13/2021  . Nausea and vomiting 12/13/2021  . Heart failure (HCC) 12/08/2021  . Hypertension associated with diabetes (HCC) 07/12/2021  . Chronic kidney disease, stage 3a (HCC) 07/12/2021  . Hyperkalemia 07/12/2021  . CHF exacerbation (HCC) 04/20/2021  . HFrEF (heart failure with reduced ejection fraction) (HCC) 02/03/2021  . Diabetes mellitus (HCC) 03/27/2019  . Hyperlipidemia associated with type 2 diabetes mellitus (HCC) 03/27/2019  . CAD (coronary artery disease) 03/05/2019  . History of MI (myocardial infarction) 03/05/2019  . Type 2 diabetes mellitus with diabetic neuropathy, unspecified (HCC) 03/05/2019  . HLD (hyperlipidemia) 03/05/2019  . GERD (gastroesophageal reflux disease) 03/05/2019  .  Hypertension, essential, benign 03/05/2019    Current Outpatient Medications:  .  albuterol  (VENTOLIN  HFA) 108 (90 Base) MCG/ACT inhaler, Inhale 1-2 puffs into the lungs every 6 (six) hours for 1 week, then as needed., Disp: 6.7 g, Rfl: 0 .  amLODipine  (NORVASC ) 5 MG tablet, Take 0.5 tablets (2.5 mg total) by mouth daily., Disp: 30 tablet, Rfl: 3 .  aspirin  EC 81 MG tablet, Take 1 tablet (81 mg total) by mouth daily. Swallow whole., Disp: 30 tablet, Rfl: 11 .  atorvastatin  (LIPITOR ) 80 MG tablet, Take 1 tablet (80 mg total) by mouth daily., Disp: 90 tablet, Rfl: 3 .  carvedilol  (COREG ) 12.5 MG tablet, Take 1 tablet (12.5 mg total) by mouth 2 (two) times daily., Disp: 180 tablet, Rfl: 3 .  clopidogrel  (PLAVIX ) 75 MG tablet, Take 1 tablet (75 mg total) by mouth daily., Disp: 90 tablet, Rfl: 2 .  cyanocobalamin  (VITAMIN B12) 1000 MCG tablet, Take 1 tablet (1,000 mcg total) by mouth daily., Disp: 130 tablet, Rfl: 3 .  DULoxetine  (CYMBALTA ) 30 MG capsule, Take 1 capsule (30 mg total) by mouth daily., Disp: 90 capsule, Rfl: 2 .  empagliflozin  (JARDIANCE ) 25 MG TABS tablet, Take 1 tablet (25 mg total) by mouth daily before breakfast., Disp: 90 tablet, Rfl: 2 .  ezetimibe  (ZETIA ) 10 MG tablet, Take 1 tablet (10 mg total) by mouth daily., Disp: 90 tablet, Rfl: 1 .  hydrALAZINE  (APRESOLINE ) 50 MG tablet, Take 2 tablets (100 mg total) by mouth 3 (three) times daily., Disp: 180 tablet, Rfl: 11 .  hydrocortisone  cream 1 %, Apply 1 Application topically 2 (two) times daily as needed for itching., Disp: 45 g, Rfl: 1 .  isosorbide  mononitrate (IMDUR ) 60 MG 24 hr tablet, Take 1.5 tablets (90 mg  total) by mouth daily., Disp: 180 tablet, Rfl: 3 .  linagliptin  (TRADJENTA ) 5 MG TABS tablet, Take 1 tablet (5 mg total) by mouth daily., Disp: 30 tablet, Rfl: 0 .  sacubitril -valsartan  (ENTRESTO ) 97-103 MG, Take 1 tablet by mouth 2 (two) times daily., Disp: 60 tablet, Rfl: 3 .  Semaglutide  (RYBELSUS ) 3 MG TABS, Take 1  tablet (3 mg total) by mouth daily., Disp: 30 tablet, Rfl: 4 .  spironolactone  (ALDACTONE ) 25 MG tablet, Take 0.5 tablets (12.5 mg total) by mouth daily., Disp: 45 tablet, Rfl: 3 .  Tafamidis  (VYNDAMAX ) 61 MG CAPS, Take 1 capsule by mouth daily., Disp: 30 capsule, Rfl: 11 .  torsemide  (DEMADEX ) 20 MG tablet, Take 1 tablet (20 mg total) by mouth daily., Disp: 30 tablet, Rfl: 6 Allergies  Allergen Reactions  . Bee Venom Anaphylaxis, Swelling and Other (See Comments)    Swells all over     Social History   Socioeconomic History  . Marital status: Single    Spouse name: Not on file  . Number of children: Not on file  . Years of education: Not on file  . Highest education level: Not on file  Occupational History  . Not on file  Tobacco Use  . Smoking status: Former    Current packs/day: 0.00    Types: Cigarettes    Quit date: 10/06/2017    Years since quitting: 6.2  . Smokeless tobacco: Never  Substance and Sexual Activity  . Alcohol use: Yes    Comment: Occasional Beer  . Drug use: Yes    Types: Marijuana  . Sexual activity: Not on file  Other Topics Concern  . Not on file  Social History Narrative   Right Handed.    Lives in a two story home    Lives with 82 year old son.    Social Drivers of Corporate investment banker Strain: Low Risk  (01/18/2022)   Overall Financial Resource Strain (CARDIA)   . Difficulty of Paying Living Expenses: Not hard at all  Food Insecurity: No Food Insecurity (09/21/2022)   Hunger Vital Sign   . Worried About Programme researcher, broadcasting/film/video in the Last Year: Never true   . Ran Out of Food in the Last Year: Never true  Transportation Needs: No Transportation Needs (06/04/2022)   PRAPARE - Transportation   . Lack of Transportation (Medical): No   . Lack of Transportation (Non-Medical): No  Physical Activity: Inactive (01/18/2022)   Exercise Vital Sign   . Days of Exercise per Week: 0 days   . Minutes of Exercise per Session: 0 min  Stress: Stress  Concern Present (01/18/2022)   [employer] of Occupational Health - Occupational Stress Questionnaire   . Feeling of Stress : To some extent  Social Connections: Socially Isolated (01/18/2022)   Social Connection and Isolation Panel   . Frequency of Communication with Friends and Family: More than three times a week   . Frequency of Social Gatherings with Friends and Family: More than three times a week   . Attends Religious Services: Never   . Active Member of Clubs or Organizations: No   . Attends Banker Meetings: Never   . Marital Status: Never married  Intimate Partner Violence: Not At Risk (01/18/2022)   Humiliation, Afraid, Rape, and Kick questionnaire   . Fear of Current or Ex-Partner: No   . Emotionally Abused: No   . Physically Abused: No   . Sexually Abused: No    Physical  Exam      No future appointments.

## 2024-01-06 ENCOUNTER — Other Ambulatory Visit (HOSPITAL_COMMUNITY): Payer: Self-pay

## 2024-01-07 ENCOUNTER — Other Ambulatory Visit (HOSPITAL_COMMUNITY): Payer: Self-pay

## 2024-01-10 ENCOUNTER — Other Ambulatory Visit: Payer: Self-pay | Admitting: Family Medicine

## 2024-01-10 ENCOUNTER — Other Ambulatory Visit (HOSPITAL_COMMUNITY): Payer: Self-pay

## 2024-01-10 MED ORDER — LINAGLIPTIN 5 MG PO TABS
5.0000 mg | ORAL_TABLET | Freq: Every day | ORAL | 0 refills | Status: DC
Start: 2024-01-10 — End: 2024-02-09
  Filled 2024-01-10: qty 30, 30d supply, fill #0

## 2024-01-12 ENCOUNTER — Other Ambulatory Visit (HOSPITAL_COMMUNITY): Payer: Self-pay

## 2024-01-13 ENCOUNTER — Other Ambulatory Visit (HOSPITAL_COMMUNITY): Payer: Self-pay | Admitting: Internal Medicine

## 2024-01-13 ENCOUNTER — Other Ambulatory Visit (HOSPITAL_COMMUNITY): Payer: Self-pay

## 2024-01-13 ENCOUNTER — Other Ambulatory Visit: Payer: Self-pay

## 2024-01-13 MED ORDER — SACUBITRIL-VALSARTAN 97-103 MG PO TABS
1.0000 | ORAL_TABLET | Freq: Two times a day (BID) | ORAL | 3 refills | Status: DC
  Filled 2024-01-13: qty 60, 30d supply, fill #0
  Filled 2024-02-12: qty 60, 30d supply, fill #1
  Filled 2024-03-12: qty 60, 30d supply, fill #2
  Filled 2024-04-06: qty 60, 30d supply, fill #3

## 2024-01-16 ENCOUNTER — Other Ambulatory Visit (HOSPITAL_COMMUNITY): Payer: Self-pay

## 2024-01-18 ENCOUNTER — Other Ambulatory Visit (HOSPITAL_COMMUNITY): Payer: Self-pay | Admitting: Emergency Medicine

## 2024-01-18 ENCOUNTER — Other Ambulatory Visit: Payer: Self-pay

## 2024-01-18 NOTE — Progress Notes (Signed)
 Specialty Pharmacy Refill Coordination Note  Thomas West is a 63 y.o. male contacted today regarding refills of specialty medication(s) Tafamidis  (Vyndamax )  Spoke with [name]  Patient requested Delivery   Delivery date: 01/20/24   Verified address: [address]  [place] [address]   Medication will be filled on 01/19/24.

## 2024-01-18 NOTE — Progress Notes (Unsigned)
 Paramedicine Encounter    Patient ID: Thomas West, male    DOB: Nov 19, 1960, 63 y.o.   MRN: [id number]   Complaints***  Assessment***  Compliance with meds***  Pill box filled***  Refills needed Hydralazine , torsemide   Meds changes since last visit***    Social changes***   There were no vitals taken for this visit. Weight yesterday- Last visit weight-171lb  ACTION: {Paramed [phone]}  [name], EMT-Paramedic [phone] 01/18/24  Patient Care Team: [name], [name], MD as PCP - General (Family Medicine) Bensimhon, Toribio SAUNDERS, MD as PCP - Cardiology (Cardiology) Tobie Tonita POUR, DO as Consulting Physician (Neurology) Lionell Jon DEL, [facility] (Pharmacist) Lionell Jon DEL, [facility] (Pharmacist)  Patient Active Problem List   Diagnosis Date Noted  . 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 Pruritic rash 05/25/2022  . Polyneuropathy associated with underlying disease (HCC) 01/15/2022  . Atherosclerosis of aorta (HCC) 01/15/2022  . History of CVA (cerebrovascular accident) without residual deficits 12/24/2021  . AKI (acute kidney injury) (HCC) 12/13/2021  . Diarrhea 12/13/2021  . Shortness of breath 12/13/2021  . Chest pain 12/13/2021  . Nausea and vomiting 12/13/2021  . Heart failure (HCC) 12/08/2021  . Hypertension associated with diabetes (HCC) 07/12/2021  . Chronic kidney disease, stage 3a (HCC) 07/12/2021  . Hyperkalemia 07/12/2021  . CHF exacerbation (HCC) 04/20/2021  . HFrEF (heart failure with reduced ejection fraction) (HCC) 02/03/2021  . Diabetes mellitus (HCC) 03/27/2019  . Hyperlipidemia associated with type 2 diabetes mellitus (HCC) 03/27/2019  . CAD (coronary artery disease) 03/05/2019  . History of MI (myocardial infarction) 03/05/2019  . Type 2 diabetes mellitus with diabetic neuropathy, unspecified (HCC) 03/05/2019  . HLD (hyperlipidemia) 03/05/2019  . GERD (gastroesophageal reflux disease) 03/05/2019  . Hypertension, essential, benign  03/05/2019    Current Outpatient Medications:  .  albuterol  (VENTOLIN  HFA) 108 (90 Base) MCG/ACT inhaler, Inhale 1-2 puffs into the lungs every 6 (six) hours for 1 week, then as needed., Disp: 6.7 g, Rfl: 0 .  amLODipine  (NORVASC ) 5 MG tablet, Take 0.5 tablets (2.5 mg total) by mouth daily., Disp: 30 tablet, Rfl: 3 .  aspirin  EC 81 MG tablet, Take 1 tablet (81 mg total) by mouth daily. Swallow whole., Disp: 30 tablet, Rfl: 11 .  atorvastatin  (LIPITOR ) 80 MG tablet, Take 1 tablet (80 mg total) by mouth daily., Disp: 90 tablet, Rfl: 3 .  carvedilol  (COREG ) 12.5 MG tablet, Take 1 tablet (12.5 mg total) by mouth 2 (two) times daily., Disp: 180 tablet, Rfl: 3 .  clopidogrel  (PLAVIX ) 75 MG tablet, Take 1 tablet (75 mg total) by mouth daily., Disp: 90 tablet, Rfl: 2 .  cyanocobalamin  (VITAMIN B12) 1000 MCG tablet, Take 1 tablet (1,000 mcg total) by mouth daily., Disp: 130 tablet, Rfl: 3 .  DULoxetine  (CYMBALTA ) 30 MG capsule, Take 1 capsule (30 mg total) by mouth daily., Disp: 90 capsule, Rfl: 2 .  empagliflozin  (JARDIANCE ) 25 MG TABS tablet, Take 1 tablet (25 mg total) by mouth daily before breakfast., Disp: 90 tablet, Rfl: 2 .  ezetimibe  (ZETIA ) 10 MG tablet, Take 1 tablet (10 mg total) by mouth daily., Disp: 90 tablet, Rfl: 1 .  isosorbide  mononitrate (IMDUR ) 60 MG 24 hr tablet, Take 1.5 tablets (90 mg total) by mouth daily., Disp: 180 tablet, Rfl: 3 .  linagliptin  (TRADJENTA ) 5 MG TABS tablet, Take 1 tablet (5 mg total) by mouth daily. Due for follow up, Disp: 30 tablet, Rfl: 0 .  sacubitril -valsartan  (ENTRESTO ) 97-103 MG, Take 1 tablet by mouth 2 (two) times  daily., Disp: 60 tablet, Rfl: 3 .  spironolactone  (ALDACTONE ) 25 MG tablet, Take 0.5 tablets (12.5 mg total) by mouth daily., Disp: 45 tablet, Rfl: 3 .  Tafamidis  (VYNDAMAX ) 61 MG CAPS, Take 1 capsule by mouth daily., Disp: 30 capsule, Rfl: 11 .  torsemide  (DEMADEX ) 20 MG tablet, Take 1 tablet (20 mg total) by mouth daily., Disp: 30 tablet, Rfl:  6 .  hydrALAZINE  (APRESOLINE ) 50 MG tablet, Take 2 tablets (100 mg total) by mouth 3 (three) times daily., Disp: 180 tablet, Rfl: 11 .  hydrocortisone  cream 1 %, Apply 1 Application topically 2 (two) times daily as needed for itching. (Patient not taking: Reported on 12/28/2023), Disp: 45 g, Rfl: 1 .  Semaglutide  (RYBELSUS ) 3 MG TABS, Take 1 tablet (3 mg total) by mouth daily. (Patient not taking: Reported on 01/18/2024), Disp: 30 tablet, Rfl: 4 Allergies  Allergen Reactions  . Bee Venom Anaphylaxis, Swelling and Other (See Comments)    Swells all over     Social History   Socioeconomic History  . Marital status: Single    Spouse name: Not on file  . Number of children: Not on file  . Years of education: Not on file  . Highest education level: Not on file  Occupational History  . Not on file  Tobacco Use  . Smoking status: Former    Current packs/day: 0.00    Types: Cigarettes    Quit date: 10/06/2017    Years since quitting: 6.2  . Smokeless tobacco: Never  Substance and Sexual Activity  . Alcohol use: Yes    Comment: Occasional Beer  . Drug use: Yes    Types: Marijuana  . Sexual activity: Not on file  Other Topics Concern  . Not on file  Social History Narrative   Right Handed.    Lives in a two story home    Lives with 58 year old son.    Social Drivers of Corporate investment banker Strain: Low Risk  (01/18/2022)   Overall Financial Resource Strain (CARDIA)   . Difficulty of Paying Living Expenses: Not hard at all  Food Insecurity: No Food Insecurity (09/21/2022)   Hunger Vital Sign   . Worried About Programme researcher, broadcasting/film/video in the Last Year: Never true   . Ran Out of Food in the Last Year: Never true  Transportation Needs: No Transportation Needs (06/04/2022)   PRAPARE - Transportation   . Lack of Transportation (Medical): No   . Lack of Transportation (Non-Medical): No  Physical Activity: Inactive (01/18/2022)   Exercise Vital Sign   . Days of Exercise per Week: 0  days   . Minutes of Exercise per Session: 0 min  Stress: Stress Concern Present (01/18/2022)   [employer] of Occupational Health - Occupational Stress Questionnaire   . Feeling of Stress : To some extent  Social Connections: Socially Isolated (01/18/2022)   Social Connection and Isolation Panel   . Frequency of Communication with Friends and Family: More than three times a week   . Frequency of Social Gatherings with Friends and Family: More than three times a week   . Attends Religious Services: Never   . Active Member of Clubs or Organizations: No   . Attends Banker Meetings: Never   . Marital Status: Never married  Intimate Partner Violence: Not At Risk (01/18/2022)   Humiliation, Afraid, Rape, and Kick questionnaire   . Fear of Current or Ex-Partner: No   . Emotionally Abused: No   .  Physically Abused: No   . Sexually Abused: No    Physical Exam      No future appointments.

## 2024-01-23 ENCOUNTER — Other Ambulatory Visit: Payer: Self-pay | Admitting: Family Medicine

## 2024-01-24 ENCOUNTER — Other Ambulatory Visit (HOSPITAL_COMMUNITY): Payer: Self-pay

## 2024-01-24 ENCOUNTER — Other Ambulatory Visit: Payer: Self-pay

## 2024-01-24 MED ORDER — EZETIMIBE 10 MG PO TABS
10.0000 mg | ORAL_TABLET | Freq: Every day | ORAL | 0 refills | Status: DC
  Filled 2024-01-24: qty 90, 90d supply, fill #0

## 2024-01-26 ENCOUNTER — Telehealth: Payer: Self-pay

## 2024-01-26 NOTE — Telephone Encounter (Signed)
-----   Message from Chiquita JONELLE Cramp sent at 01/26/2024 10:15 AM EDT ----- We ordered cologuard in March. Received message that order is expired. Please check to see if pt has kit, if kit is still ok and to complete? Or reorder if needed. Thanks! ----- Message ----- From: SYSTEM Sent: 11/16/2023  12:19 AM EDT To: Chiquita JONELLE Cramp, DO

## 2024-01-26 NOTE — Telephone Encounter (Signed)
 I spoke with the patient and he reported he did complete Cologuard but he was unsuccessful in sending the kit off in a timely manner. Patient reported that he does not wish to repeat cologuard at this time.

## 2024-01-27 ENCOUNTER — Other Ambulatory Visit (HOSPITAL_COMMUNITY): Payer: Self-pay | Admitting: Emergency Medicine

## 2024-01-27 NOTE — Progress Notes (Signed)
 Paramedicine Encounter    Patient ID: Thomas West, male    DOB: 07-09-1960, 63 y.o.   MRN: [id number]   Complaints NONE  Assessment A&O x 4, skin W&D w/ good color.  Denies chest pain or SOB.  Lung sounds clear and equal bilat.  No edema noted.  Compliance with meds No- missed a whole weeks worth of meds due to a death in the family and did not take his meds with him to [place] .  Pill box filled x 2 weeks  Refills needed NONE  Meds changes since last visit NONE    Social changes NONE   BP (!) 180/100 (BP Location: Left Arm, Patient Position: Sitting, Cuff Size: Normal)   Pulse 86   Resp 16   Wt 167 lb 3.2 oz (75.8 kg)   SpO2 94%   BMI 24.69 kg/m  Weight yesterday- Last visit weight-167lb  Discussed with Thomas West the necessity to be compliant with his medications. Lack of compliance leads to potential hospital admissions which is what we are trying to avoid.  Pt advises he understands.  ACTION: Home visit completed  [name] [phone] 02/02/24  Patient Care Team: [name], [name], MD as PCP - General (Family Medicine) Bensimhon, Toribio SAUNDERS, MD as PCP - Cardiology (Cardiology) Tobie Tonita POUR, DO as Consulting Physician (Neurology) Lionell Jon DEL, [facility] (Pharmacist) Lionell Jon DEL, [facility] (Pharmacist)  Patient Active Problem List   Diagnosis Date Noted   Cardiac amyloidosis [facility]) 05/25/2022   Pruritic rash 05/25/2022   Polyneuropathy associated with underlying disease (HCC) 01/15/2022   Atherosclerosis of aorta (HCC) 01/15/2022   History of CVA (cerebrovascular accident) without residual deficits 12/24/2021   AKI (acute kidney injury) (HCC) 12/13/2021   Diarrhea 12/13/2021   Shortness of breath 12/13/2021   Chest pain 12/13/2021   Nausea and vomiting 12/13/2021   Heart failure (HCC) 12/08/2021   Hypertension associated with diabetes (HCC) 07/12/2021   Chronic kidney disease, stage 3a (HCC) 07/12/2021   Hyperkalemia 07/12/2021   CHF  exacerbation (HCC) 04/20/2021   HFrEF (heart failure with reduced ejection fraction) (HCC) 02/03/2021   Diabetes mellitus (HCC) 03/27/2019   Hyperlipidemia associated with type 2 diabetes mellitus (HCC) 03/27/2019   CAD (coronary artery disease) 03/05/2019   History of MI (myocardial infarction) 03/05/2019   Type 2 diabetes mellitus with diabetic neuropathy, unspecified (HCC) 03/05/2019   HLD (hyperlipidemia) 03/05/2019   GERD (gastroesophageal reflux disease) 03/05/2019   Hypertension, essential, benign 03/05/2019    Current Outpatient Medications:    albuterol  (VENTOLIN  HFA) 108 (90 Base) MCG/ACT inhaler, Inhale 1-2 puffs into the lungs every 6 (six) hours for 1 week, then as needed., Disp: 6.7 g, Rfl: 0   amLODipine  (NORVASC ) 5 MG tablet, Take 0.5 tablets (2.5 mg total) by mouth daily., Disp: 30 tablet, Rfl: 3   aspirin  EC 81 MG tablet, Take 1 tablet (81 mg total) by mouth daily. Swallow whole., Disp: 30 tablet, Rfl: 11   atorvastatin  (LIPITOR ) 80 MG tablet, Take 1 tablet (80 mg total) by mouth daily., Disp: 90 tablet, Rfl: 3   carvedilol  (COREG ) 12.5 MG tablet, Take 1 tablet (12.5 mg total) by mouth 2 (two) times daily., Disp: 180 tablet, Rfl: 3   clopidogrel  (PLAVIX ) 75 MG tablet, Take 1 tablet (75 mg total) by mouth daily., Disp: 90 tablet, Rfl: 2   cyanocobalamin  (VITAMIN B12) 1000 MCG tablet, Take 1 tablet (1,000 mcg total) by mouth daily., Disp: 130 tablet, Rfl: 3   DULoxetine  (CYMBALTA ) 30 MG capsule,  Take 1 capsule (30 mg total) by mouth daily., Disp: 90 capsule, Rfl: 2   empagliflozin  (JARDIANCE ) 25 MG TABS tablet, Take 1 tablet (25 mg total) by mouth daily before breakfast., Disp: 90 tablet, Rfl: 2   ezetimibe  (ZETIA ) 10 MG tablet, Take 1 tablet (10 mg total) by mouth daily., Disp: 90 tablet, Rfl: 0   hydrALAZINE  (APRESOLINE ) 50 MG tablet, Take 2 tablets (100 mg total) by mouth 3 (three) times daily., Disp: 180 tablet, Rfl: 11   isosorbide  mononitrate (IMDUR ) 60 MG 24 hr tablet,  Take 1.5 tablets (90 mg total) by mouth daily., Disp: 180 tablet, Rfl: 3   linagliptin  (TRADJENTA ) 5 MG TABS tablet, Take 1 tablet (5 mg total) by mouth daily. Due for follow up, Disp: 30 tablet, Rfl: 0   sacubitril -valsartan  (ENTRESTO ) 97-103 MG, Take 1 tablet by mouth 2 (two) times daily., Disp: 60 tablet, Rfl: 3   spironolactone  (ALDACTONE ) 25 MG tablet, Take 0.5 tablets (12.5 mg total) by mouth daily., Disp: 45 tablet, Rfl: 3   Tafamidis  (VYNDAMAX ) 61 MG CAPS, Take 1 capsule by mouth daily., Disp: 30 capsule, Rfl: 11   torsemide  (DEMADEX ) 20 MG tablet, Take 1 tablet (20 mg total) by mouth daily., Disp: 30 tablet, Rfl: 6   hydrocortisone  cream 1 %, Apply 1 Application topically 2 (two) times daily as needed for itching. (Patient not taking: Reported on 01/27/2024), Disp: 45 g, Rfl: 1   Semaglutide  (RYBELSUS ) 3 MG TABS, Take 1 tablet (3 mg total) by mouth daily. (Patient not taking: Reported on 01/27/2024), Disp: 30 tablet, Rfl: 4 Allergies  Allergen Reactions   Bee Venom Anaphylaxis, Swelling and Other (See Comments)    Swells all over     Social History   Socioeconomic History   Marital status: Single    Spouse name: Not on file   Number of children: Not on file   Years of education: Not on file   Highest education level: Not on file  Occupational History   Not on file  Tobacco Use   Smoking status: Former    Current packs/day: 0.00    Types: Cigarettes    Quit date: 10/06/2017    Years since quitting: 6.3   Smokeless tobacco: Never  Substance and Sexual Activity   Alcohol use: Yes    Comment: Occasional Beer   Drug use: Yes    Types: Marijuana   Sexual activity: Not on file  Other Topics Concern   Not on file  Social History Narrative   Right Handed.    Lives in a two story home    Lives with 68 year old son.    Social Drivers of Corporate investment banker Strain: Low Risk  (01/18/2022)   Overall Financial Resource Strain (CARDIA)    Difficulty of Paying Living  Expenses: Not hard at all  Food Insecurity: No Food Insecurity (09/21/2022)   Hunger Vital Sign    Worried About Running Out of Food in the Last Year: Never true    Ran Out of Food in the Last Year: Never true  Transportation Needs: No Transportation Needs (06/04/2022)   PRAPARE - Administrator, Civil Service (Medical): No    Lack of Transportation (Non-Medical): No  Physical Activity: Inactive (01/18/2022)   Exercise Vital Sign    Days of Exercise per Week: 0 days    Minutes of Exercise per Session: 0 min  Stress: Stress Concern Present (01/18/2022)   [employer] of Occupational Health - Occupational Stress  Questionnaire    Feeling of Stress : To some extent  Social Connections: Socially Isolated (01/18/2022)   Social Connection and Isolation Panel    Frequency of Communication with Friends and Family: More than three times a week    Frequency of Social Gatherings with Friends and Family: More than three times a week    Attends Religious Services: Never    Database administrator or Organizations: No    Attends Banker Meetings: Never    Marital Status: Never married  Intimate Partner Violence: Not At Risk (01/18/2022)   Humiliation, Afraid, Rape, and Kick questionnaire    Fear of Current or Ex-Partner: No    Emotionally Abused: No    Physically Abused: No    Sexually Abused: No    Physical Exam      No future appointments.

## 2024-02-09 ENCOUNTER — Other Ambulatory Visit: Payer: Self-pay | Admitting: Family Medicine

## 2024-02-09 ENCOUNTER — Other Ambulatory Visit (HOSPITAL_COMMUNITY): Payer: Self-pay

## 2024-02-10 ENCOUNTER — Other Ambulatory Visit (HOSPITAL_COMMUNITY): Payer: Self-pay

## 2024-02-13 ENCOUNTER — Other Ambulatory Visit (HOSPITAL_COMMUNITY): Payer: Self-pay

## 2024-02-13 ENCOUNTER — Other Ambulatory Visit: Payer: Self-pay

## 2024-02-13 MED ORDER — LINAGLIPTIN 5 MG PO TABS
5.0000 mg | ORAL_TABLET | Freq: Every day | ORAL | 0 refills | Status: DC
  Filled 2024-02-13: qty 30, 30d supply, fill #0

## 2024-02-14 ENCOUNTER — Other Ambulatory Visit: Payer: Self-pay | Admitting: Family Medicine

## 2024-02-14 ENCOUNTER — Other Ambulatory Visit (HOSPITAL_COMMUNITY): Payer: Self-pay | Admitting: Internal Medicine

## 2024-02-14 ENCOUNTER — Other Ambulatory Visit: Payer: Self-pay

## 2024-02-14 ENCOUNTER — Other Ambulatory Visit (HOSPITAL_COMMUNITY): Payer: Self-pay | Admitting: Emergency Medicine

## 2024-02-14 ENCOUNTER — Other Ambulatory Visit (HOSPITAL_COMMUNITY): Payer: Self-pay

## 2024-02-14 DIAGNOSIS — I2510 Atherosclerotic heart disease of native coronary artery without angina pectoris: Secondary | ICD-10-CM

## 2024-02-14 DIAGNOSIS — G63 Polyneuropathy in diseases classified elsewhere: Secondary | ICD-10-CM

## 2024-02-14 MED ORDER — DULOXETINE HCL 30 MG PO CPEP
30.0000 mg | ORAL_CAPSULE | Freq: Every day | ORAL | 2 refills | Status: AC
  Filled 2024-02-14 – 2024-02-24 (×2): qty 90, 90d supply, fill #0
  Filled 2024-03-14 – 2024-05-20 (×2): qty 90, 90d supply, fill #1

## 2024-02-14 NOTE — Progress Notes (Unsigned)
 Paramedicine Encounter    Patient ID: Thomas West, male    DOB: 25-Oct-1960, 63 y.o.   MRN: [id number]   Complaints***  Assessment***  Compliance with meds***  Pill box filled***  Refills needed  Hydralazine , Duloxetine , Atorvastatin , Isosorbide , Tragenta  Meds changes since last visit***    Social changes***   There were no vitals taken for this visit. Weight yesterday-*** Last visit weight-***  ACTION: {Paramed Action:[phone]}  Thomas West, EMT-Paramedic [phone] 02/14/24  Patient Care Team: [name], [name], MD as PCP - General (Family Medicine) Thomas West SAUNDERS, MD as PCP - Cardiology (Cardiology) Patel, Donika K, DO as Consulting Physician (Neurology) Thomas West, [facility] (Pharmacist) Thomas West, [facility] (Pharmacist)  Patient Active Problem List   Diagnosis Date Noted  . 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 Pruritic rash 05/25/2022  . Polyneuropathy associated with underlying disease 01/15/2022  . Atherosclerosis of aorta 01/15/2022  . History of CVA (cerebrovascular accident) without residual deficits 12/24/2021  . AKI (acute kidney injury) 12/13/2021  . Diarrhea 12/13/2021  . Shortness of breath 12/13/2021  . Chest pain 12/13/2021  . Nausea and vomiting 12/13/2021  . Heart failure (HCC) 12/08/2021  . Hypertension associated with diabetes (HCC) 07/12/2021  . Chronic kidney disease, stage 3a (HCC) 07/12/2021  . Hyperkalemia 07/12/2021  . CHF exacerbation (HCC) 04/20/2021  . HFrEF (heart failure with reduced ejection fraction) (HCC) 02/03/2021  . Diabetes mellitus (HCC) 03/27/2019  . Hyperlipidemia associated with type 2 diabetes mellitus (HCC) 03/27/2019  . CAD (coronary artery disease) 03/05/2019  . History of MI (myocardial infarction) 03/05/2019  . Type 2 diabetes mellitus with diabetic neuropathy, unspecified (HCC) 03/05/2019  . HLD (hyperlipidemia) 03/05/2019  . GERD (gastroesophageal reflux disease) 03/05/2019  . Hypertension,  essential, benign 03/05/2019    Current Outpatient Medications:  .  albuterol  (VENTOLIN  HFA) 108 (90 Base) MCG/ACT inhaler, Inhale 1-2 puffs into the lungs every 6 (six) hours for 1 week, then as needed., Disp: 6.7 West, Rfl: 0 .  amLODipine  (NORVASC ) 5 MG tablet, Take 0.5 tablets (2.5 mg total) by mouth daily., Disp: 30 tablet, Rfl: 3 .  aspirin  EC 81 MG tablet, Take 1 tablet (81 mg total) by mouth daily. Swallow whole., Disp: 30 tablet, Rfl: 11 .  atorvastatin  (LIPITOR ) 80 MG tablet, Take 1 tablet (80 mg total) by mouth daily., Disp: 90 tablet, Rfl: 3 .  carvedilol  (COREG ) 12.5 MG tablet, Take 1 tablet (12.5 mg total) by mouth 2 (two) times daily., Disp: 180 tablet, Rfl: 3 .  clopidogrel  (PLAVIX ) 75 MG tablet, Take 1 tablet (75 mg total) by mouth daily., Disp: 90 tablet, Rfl: 2 .  cyanocobalamin  (VITAMIN B12) 1000 MCG tablet, Take 1 tablet (1,000 mcg total) by mouth daily., Disp: 130 tablet, Rfl: 3 .  DULoxetine  (CYMBALTA ) 30 MG capsule, Take 1 capsule (30 mg total) by mouth daily., Disp: 90 capsule, Rfl: 2 .  empagliflozin  (JARDIANCE ) 25 MG TABS tablet, Take 1 tablet (25 mg total) by mouth daily before breakfast., Disp: 90 tablet, Rfl: 2 .  ezetimibe  (ZETIA ) 10 MG tablet, Take 1 tablet (10 mg total) by mouth daily., Disp: 90 tablet, Rfl: 0 .  hydrALAZINE  (APRESOLINE ) 50 MG tablet, Take 2 tablets (100 mg total) by mouth 3 (three) times daily., Disp: 180 tablet, Rfl: 11 .  isosorbide  mononitrate (IMDUR ) 60 MG 24 hr tablet, Take 1.5 tablets (90 mg total) by mouth daily., Disp: 180 tablet, Rfl: 3 .  linagliptin  (TRADJENTA ) 5 MG TABS tablet, Take 1 tablet (5 mg total) by  mouth daily. Due for follow up, Disp: 30 tablet, Rfl: 0 .  sacubitril -valsartan  (ENTRESTO ) 97-103 MG, Take 1 tablet by mouth 2 (two) times daily., Disp: 60 tablet, Rfl: 3 .  spironolactone  (ALDACTONE ) 25 MG tablet, Take 0.5 tablets (12.5 mg total) by mouth daily., Disp: 45 tablet, Rfl: 3 .  Tafamidis  (VYNDAMAX ) 61 MG CAPS, Take 1  capsule by mouth daily., Disp: 30 capsule, Rfl: 11 .  torsemide  (DEMADEX ) 20 MG tablet, Take 1 tablet (20 mg total) by mouth daily., Disp: 30 tablet, Rfl: 6 .  hydrocortisone  cream 1 %, Apply 1 Application topically 2 (two) times daily as needed for itching. (Patient not taking: Reported on 02/14/2024), Disp: 45 West, Rfl: 1 .  Semaglutide  (RYBELSUS ) 3 MG TABS, Take 1 tablet (3 mg total) by mouth daily. (Patient not taking: Reported on 02/14/2024), Disp: 30 tablet, Rfl: 4 Allergies  Allergen Reactions  . Bee Venom Anaphylaxis, Swelling and Other (See Comments)    Swells all over     Social History   Socioeconomic History  . Marital status: Single    Spouse name: Not on file  . Number of children: Not on file  . Years of education: Not on file  . Highest education level: Not on file  Occupational History  . Not on file  Tobacco Use  . Smoking status: Former    Current packs/day: 0.00    Types: Cigarettes    Quit date: 10/06/2017    Years since quitting: 6.3  . Smokeless tobacco: Never  Substance and Sexual Activity  . Alcohol use: Yes    Comment: Occasional Beer  . Drug use: Yes    Types: Marijuana  . Sexual activity: Not on file  Other Topics Concern  . Not on file  Social History Narrative   Right Handed.    Lives in a two story home    Lives with 3 year old son.    Social Drivers of Corporate investment banker Strain: Low Risk  (01/18/2022)   Overall Financial Resource Strain (CARDIA)   . Difficulty of Paying Living Expenses: Not hard at all  Food Insecurity: No Food Insecurity (09/21/2022)   Hunger Vital Sign   . Worried About Programme researcher, broadcasting/film/video in the Last Year: Never true   . Ran Out of Food in the Last Year: Never true  Transportation Needs: No Transportation Needs (06/04/2022)   PRAPARE - Transportation   . Lack of Transportation (Medical): No   . Lack of Transportation (Non-Medical): No  Physical Activity: Inactive (01/18/2022)   Exercise Vital Sign   . Days of  Exercise per Week: 0 days   . Minutes of Exercise per Session: 0 min  Stress: Stress Concern Present (01/18/2022)   [facility] - Occupational Stress Questionnaire   . Feeling of Stress : To some extent  Social Connections: Socially Isolated (01/18/2022)   Social Connection and Isolation Panel   . Frequency of Communication with Friends and Family: More than three times a week   . Frequency of Social Gatherings with Friends and Family: More than three times a week   . Attends Religious Services: Never   . Active Member of Clubs or Organizations: No   . Attends Banker Meetings: Never   . Marital Status: Never married  Intimate Partner Violence: Not At Risk (01/18/2022)   Humiliation, Afraid, Rape, and Kick questionnaire   . Fear of Current or Ex-Partner: No   . Emotionally Abused: No   .  Physically Abused: No   . Sexually Abused: No    Physical Exam      No future appointments.

## 2024-02-15 ENCOUNTER — Other Ambulatory Visit (HOSPITAL_COMMUNITY): Payer: Self-pay

## 2024-02-15 ENCOUNTER — Other Ambulatory Visit: Payer: Self-pay | Admitting: Pharmacy Technician

## 2024-02-15 ENCOUNTER — Other Ambulatory Visit: Payer: Self-pay

## 2024-02-15 MED ORDER — ATORVASTATIN CALCIUM 80 MG PO TABS
80.0000 mg | ORAL_TABLET | Freq: Every day | ORAL | 3 refills | Status: AC
  Filled 2024-02-15 – 2024-02-24 (×2): qty 90, 90d supply, fill #0
  Filled 2024-03-14: qty 90, 90d supply, fill #1

## 2024-02-15 MED ORDER — HYDRALAZINE HCL 50 MG PO TABS
100.0000 mg | ORAL_TABLET | Freq: Three times a day (TID) | ORAL | 11 refills | Status: AC
  Filled 2024-02-15 – 2024-02-24 (×2): qty 180, 30d supply, fill #0
  Filled 2024-03-14 – 2024-03-19 (×3): qty 180, 30d supply, fill #1
  Filled 2024-05-23: qty 180, 30d supply, fill #2

## 2024-02-15 NOTE — Progress Notes (Signed)
 Specialty Pharmacy Refill Coordination Note  Thomas West is a 63 y.o. male contacted today regarding refills of specialty medication(s) Tafamidis  (Vyndamax )   Patient requested Delivery   Delivery date: 02/17/24   Verified address: [address] [place] [address]  [place] [address]   Medication will be filled on 02/16/24.

## 2024-02-22 ENCOUNTER — Telehealth (HOSPITAL_COMMUNITY): Payer: Self-pay | Admitting: Emergency Medicine

## 2024-02-22 NOTE — Telephone Encounter (Signed)
 Called to confirm tomorrow's home visit scheduled for 8:30.  Pt advises he will not be available.  Rescheduled for 10/3 @ 1:00.    [name], EMT-Paramedic [phone] 02/22/2024

## 2024-02-24 ENCOUNTER — Other Ambulatory Visit (HOSPITAL_COMMUNITY): Payer: Self-pay

## 2024-02-24 ENCOUNTER — Other Ambulatory Visit (HOSPITAL_COMMUNITY): Payer: Self-pay | Admitting: Emergency Medicine

## 2024-02-24 ENCOUNTER — Other Ambulatory Visit: Payer: Self-pay

## 2024-02-24 NOTE — Progress Notes (Signed)
 Paramedicine Encounter    Patient ID: Thomas West, male    DOB: [birth year], 63 y.o.   MRN: [id number]   Complaints NONE  Assessment ATF Mr. Mucci in great spirits and reports he is feeling well.  Denies chest pain or SOB.  Lung sounds clear and equal bilat. No peripheral edema noted.  Compliance with meds YES  Pill box filled x 2 weeks.  Refills needed Isosorbide , Duloxetine , Toresemide, Hydralazine , Atorvastatin   Meds changes since last visit NONE    Social changes NONE   BP (!) 160/100 (BP Location: Left Arm, Patient Position: Sitting, Cuff Size: Normal)   Wt 161 lb 6.4 oz (73.2 kg)   BMI 23.83 kg/m  Weight yesterday- not taken Last visit weight-169.4lb  ACTION: Home visit completed  [name] [phone] 03/01/24  Patient Care Team: [name], [name], MD as PCP - General (Family Medicine) Bensimhon, Toribio SAUNDERS, MD as PCP - Cardiology (Cardiology) Tobie Tonita POUR, DO as Consulting Physician (Neurology) Lionell Jon DEL, [facility] (Pharmacist) Lionell Jon DEL, [facility] (Pharmacist)  Patient Active Problem List   Diagnosis Date Noted   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   Pruritic rash 05/25/2022   Polyneuropathy associated with underlying disease 01/15/2022   Atherosclerosis of aorta 01/15/2022   History of CVA (cerebrovascular accident) without residual deficits 12/24/2021   AKI (acute kidney injury) 12/13/2021   Diarrhea 12/13/2021   Shortness of breath 12/13/2021   Chest pain 12/13/2021   Nausea and vomiting 12/13/2021   Heart failure (HCC) 12/08/2021   Hypertension associated with diabetes (HCC) 07/12/2021   Chronic kidney disease, stage 3a (HCC) 07/12/2021   Hyperkalemia 07/12/2021   CHF exacerbation (HCC) 04/20/2021   HFrEF (heart failure with reduced ejection fraction) (HCC) 02/03/2021   Diabetes mellitus (HCC) 03/27/2019   Hyperlipidemia associated with type 2 diabetes mellitus (HCC) 03/27/2019   CAD (coronary artery disease) 03/05/2019    History of MI (myocardial infarction) 03/05/2019   Type 2 diabetes mellitus with diabetic neuropathy, unspecified (HCC) 03/05/2019   HLD (hyperlipidemia) 03/05/2019   GERD (gastroesophageal reflux disease) 03/05/2019   Hypertension, essential, benign 03/05/2019    Current Outpatient Medications:    albuterol  (VENTOLIN  HFA) 108 (90 Base) MCG/ACT inhaler, Inhale 1-2 puffs into the lungs every 6 (six) hours for 1 week, then as needed., Disp: 6.7 g, Rfl: 0   amLODipine  (NORVASC ) 5 MG tablet, Take 0.5 tablets (2.5 mg total) by mouth daily., Disp: 30 tablet, Rfl: 3   aspirin  EC 81 MG tablet, Take 1 tablet (81 mg total) by mouth daily. Swallow whole., Disp: 30 tablet, Rfl: 11   atorvastatin  (LIPITOR ) 80 MG tablet, Take 1 tablet (80 mg total) by mouth daily., Disp: 90 tablet, Rfl: 3   carvedilol  (COREG ) 12.5 MG tablet, Take 1 tablet (12.5 mg total) by mouth 2 (two) times daily., Disp: 180 tablet, Rfl: 3   clopidogrel  (PLAVIX ) 75 MG tablet, Take 1 tablet (75 mg total) by mouth daily., Disp: 90 tablet, Rfl: 2   cyanocobalamin  (VITAMIN B12) 1000 MCG tablet, Take 1 tablet (1,000 mcg total) by mouth daily., Disp: 130 tablet, Rfl: 3   DULoxetine  (CYMBALTA ) 30 MG capsule, Take 1 capsule (30 mg total) by mouth daily., Disp: 90 capsule, Rfl: 2   empagliflozin  (JARDIANCE ) 25 MG TABS tablet, Take 1 tablet (25 mg total) by mouth daily before breakfast., Disp: 90 tablet, Rfl: 2   ezetimibe  (ZETIA ) 10 MG tablet, Take 1 tablet (10 mg total) by mouth daily., Disp: 90 tablet, Rfl: 0   hydrALAZINE  (APRESOLINE ) 50  MG tablet, Take 2 tablets (100 mg total) by mouth 3 (three) times daily., Disp: 180 tablet, Rfl: 11   hydrocortisone  cream 1 %, Apply 1 Application topically 2 (two) times daily as needed for itching., Disp: 45 g, Rfl: 1   isosorbide  mononitrate (IMDUR ) 60 MG 24 hr tablet, Take 1.5 tablets (90 mg total) by mouth daily., Disp: 180 tablet, Rfl: 3   linagliptin  (TRADJENTA ) 5 MG TABS tablet, Take 1 tablet (5 mg  total) by mouth daily. Due for follow up, Disp: 30 tablet, Rfl: 0   sacubitril -valsartan  (ENTRESTO ) 97-103 MG, Take 1 tablet by mouth 2 (two) times daily., Disp: 60 tablet, Rfl: 3   spironolactone  (ALDACTONE ) 25 MG tablet, Take 0.5 tablets (12.5 mg total) by mouth daily., Disp: 45 tablet, Rfl: 3   Tafamidis  (VYNDAMAX ) 61 MG CAPS, Take 1 capsule by mouth daily., Disp: 30 capsule, Rfl: 11   torsemide  (DEMADEX ) 20 MG tablet, Take 1 tablet (20 mg total) by mouth daily., Disp: 30 tablet, Rfl: 6   Semaglutide  (RYBELSUS ) 3 MG TABS, Take 1 tablet (3 mg total) by mouth daily. (Patient not taking: Reported on 02/24/2024), Disp: 30 tablet, Rfl: 4 Allergies  Allergen Reactions   Bee Venom Anaphylaxis, Swelling and Other (See Comments)    Swells all over     Social History   Socioeconomic History   Marital status: Single    Spouse name: Not on file   Number of children: Not on file   Years of education: Not on file   Highest education level: Not on file  Occupational History   Not on file  Tobacco Use   Smoking status: Former    Current packs/day: 0.00    Types: Cigarettes    Quit date: 10/06/2017    Years since quitting: 6.4   Smokeless tobacco: Never  Substance and Sexual Activity   Alcohol use: Yes    Comment: Occasional Beer   Drug use: Yes    Types: Marijuana   Sexual activity: Not on file  Other Topics Concern   Not on file  Social History Narrative   Right Handed.    Lives in a two story home    Lives with 34 year old son.    Social Drivers of Corporate investment banker Strain: Low Risk  (01/18/2022)   Overall Financial Resource Strain (CARDIA)    Difficulty of Paying Living Expenses: Not hard at all  Food Insecurity: No Food Insecurity (09/21/2022)   Hunger Vital Sign    Worried About Running Out of Food in the Last Year: Never true    Ran Out of Food in the Last Year: Never true  Transportation Needs: No Transportation Needs (06/04/2022)   PRAPARE - Therapist, art (Medical): No    Lack of Transportation (Non-Medical): No  Physical Activity: Inactive (01/18/2022)   Exercise Vital Sign    Days of Exercise per Week: 0 days    Minutes of Exercise per Session: 0 min  Stress: Stress Concern Present (01/18/2022)   [employer] of Occupational Health - Occupational Stress Questionnaire    Feeling of Stress : To some extent  Social Connections: Socially Isolated (01/18/2022)   Social Connection and Isolation Panel    Frequency of Communication with Friends and Family: More than three times a week    Frequency of Social Gatherings with Friends and Family: More than three times a week    Attends Religious Services: Never    Active Member of  Clubs or Organizations: No    Attends Banker Meetings: Never    Marital Status: Never married  Intimate Partner Violence: Not At Risk (01/18/2022)   Humiliation, Afraid, Rape, and Kick questionnaire    Fear of Current or Ex-Partner: No    Emotionally Abused: No    Physically Abused: No    Sexually Abused: No    Physical Exam      Future Appointments  Date Time Provider Department Center  03/15/2024 11:00 AM [facility] ECHO OP 1 [facility]  03/15/2024 12:00 PM [facility] PA/NP [facility] None

## 2024-03-07 ENCOUNTER — Other Ambulatory Visit: Payer: Self-pay

## 2024-03-07 ENCOUNTER — Other Ambulatory Visit: Payer: Self-pay | Admitting: Family Medicine

## 2024-03-07 ENCOUNTER — Other Ambulatory Visit (HOSPITAL_COMMUNITY): Payer: Self-pay

## 2024-03-07 MED ORDER — LINAGLIPTIN 5 MG PO TABS
5.0000 mg | ORAL_TABLET | Freq: Every day | ORAL | 0 refills | Status: DC
  Filled 2024-03-07: qty 30, 30d supply, fill #0

## 2024-03-07 NOTE — Telephone Encounter (Signed)
 Copied from CRM #[id number]. Topic: Clinical - Medication Refill >> Mar 07, 2024 10:42 AM Shereese L wrote: Medication: linagliptin  (TRADJENTA ) 5 MG TABS tablet  Has the patient contacted their pharmacy? Yes (Agent: If no, request that the patient contact the pharmacy for the refill. If patient does not wish to contact the pharmacy document the reason why and proceed with request.) (Agent: If yes, when and what did the pharmacy advise?)  This is the patient's preferred pharmacy:  [facility] [address] [place] [address] Phone: [phone] Fax: [phone]    Is this the correct pharmacy for this prescription? Yes If no, delete pharmacy and type the correct one.   Has the prescription been filled recently? Yes  Is the patient out of the medication? Yes  Has the patient been seen for an appointment in the last year OR does the patient have an upcoming appointment? Yes  Can we respond through MyChart? Yes  Agent: Please be advised that Rx refills may take up to 3 business days. We ask that you follow-up with your pharmacy.

## 2024-03-09 ENCOUNTER — Other Ambulatory Visit: Payer: Self-pay

## 2024-03-12 ENCOUNTER — Other Ambulatory Visit (HOSPITAL_COMMUNITY): Payer: Self-pay

## 2024-03-13 ENCOUNTER — Other Ambulatory Visit (HOSPITAL_COMMUNITY): Payer: Self-pay

## 2024-03-13 ENCOUNTER — Other Ambulatory Visit: Payer: Self-pay

## 2024-03-14 ENCOUNTER — Other Ambulatory Visit (HOSPITAL_COMMUNITY): Payer: Self-pay | Admitting: Emergency Medicine

## 2024-03-14 ENCOUNTER — Other Ambulatory Visit: Payer: Self-pay

## 2024-03-14 ENCOUNTER — Telehealth (HOSPITAL_COMMUNITY): Payer: Self-pay

## 2024-03-14 ENCOUNTER — Other Ambulatory Visit (HOSPITAL_COMMUNITY): Payer: Self-pay

## 2024-03-14 NOTE — Progress Notes (Signed)
 Paramedicine Encounter    Patient ID: Thomas West, male    DOB: June 13, 1960, 63 y.o.   MRN: [id number]   Complaints New onset of productive cough.    Assessment A&O x 4, skin W&D w/ good color.  Denies chest pain or SOB.  Lung sounds w/ mild expiratory wheezes and noted ronchi throughout.  Compliance with meds No- missing multiple pm doses  Pill box filled x 2 weeks  Refills needed Isosorbide , Duloxetine , Atorvastatin - too soon   Meds changes since last visit NONE    Social changes NONE    BP 110/80 (BP Location: Left Arm, Patient Position: Sitting, Cuff Size: Normal)   Pulse 90   SpO2 93%  Weight yesterday- Last visit weight-161.4lb  [name] reports to be feeling well although he does complain of a recent onset of a productive cough.  He has some expiratory wheezes noted.  He is afebrile.  Denies chest pain or SOB.  No peripheral edema noted.  Suggested pt f/u w/ PCP regarding cough & congestion. Pt has an appointment tomorrow  w/ [facility] for ECHO and provider visit. I will follow notes from visit for any med changes.    ACTION: Home visit completed  [name] [phone] 03/14/24  Patient Care Team: [name], [name], MD as PCP - General (Family Medicine) Bensimhon, Toribio SAUNDERS, MD as PCP - Cardiology (Cardiology) Tobie Tonita POUR, DO as Consulting Physician (Neurology) Lionell Jon DEL, [facility] (Pharmacist) Lionell Jon DEL, [facility] (Pharmacist)  Patient Active Problem List   Diagnosis Date Noted   Cardiac amyloidosis [facility]) 05/25/2022   Pruritic rash 05/25/2022   Polyneuropathy associated with underlying disease 01/15/2022   Atherosclerosis of aorta 01/15/2022   History of CVA (cerebrovascular accident) without residual deficits 12/24/2021   AKI (acute kidney injury) 12/13/2021   Diarrhea 12/13/2021   Shortness of breath 12/13/2021   Chest pain 12/13/2021   Nausea and vomiting 12/13/2021   Heart failure (HCC) 12/08/2021   Hypertension associated with  diabetes (HCC) 07/12/2021   Chronic kidney disease, stage 3a (HCC) 07/12/2021   Hyperkalemia 07/12/2021   CHF exacerbation (HCC) 04/20/2021   HFrEF (heart failure with reduced ejection fraction) (HCC) 02/03/2021   Diabetes mellitus (HCC) 03/27/2019   Hyperlipidemia associated with type 2 diabetes mellitus (HCC) 03/27/2019   CAD (coronary artery disease) 03/05/2019   History of MI (myocardial infarction) 03/05/2019   Type 2 diabetes mellitus with diabetic neuropathy, unspecified (HCC) 03/05/2019   HLD (hyperlipidemia) 03/05/2019   GERD (gastroesophageal reflux disease) 03/05/2019   Hypertension, essential, benign 03/05/2019    Current Outpatient Medications:    albuterol  (VENTOLIN  HFA) 108 (90 Base) MCG/ACT inhaler, Inhale 1-2 puffs into the lungs every 6 (six) hours for 1 week, then as needed., Disp: 6.7 g, Rfl: 0   amLODipine  (NORVASC ) 5 MG tablet, Take 0.5 tablets (2.5 mg total) by mouth daily., Disp: 30 tablet, Rfl: 3   aspirin  EC 81 MG tablet, Take 1 tablet (81 mg total) by mouth daily. Swallow whole., Disp: 30 tablet, Rfl: 11   atorvastatin  (LIPITOR ) 80 MG tablet, Take 1 tablet (80 mg total) by mouth daily., Disp: 90 tablet, Rfl: 3   carvedilol  (COREG ) 12.5 MG tablet, Take 1 tablet (12.5 mg total) by mouth 2 (two) times daily., Disp: 180 tablet, Rfl: 3   clopidogrel  (PLAVIX ) 75 MG tablet, Take 1 tablet (75 mg total) by mouth daily., Disp: 90 tablet, Rfl: 2   cyanocobalamin  (VITAMIN B12) 1000 MCG tablet, Take 1 tablet (1,000 mcg total) by mouth daily.,  Disp: 130 tablet, Rfl: 3   DULoxetine  (CYMBALTA ) 30 MG capsule, Take 1 capsule (30 mg total) by mouth daily., Disp: 90 capsule, Rfl: 2   empagliflozin  (JARDIANCE ) 25 MG TABS tablet, Take 1 tablet (25 mg total) by mouth daily before breakfast., Disp: 90 tablet, Rfl: 2   ezetimibe  (ZETIA ) 10 MG tablet, Take 1 tablet (10 mg total) by mouth daily., Disp: 90 tablet, Rfl: 0   hydrALAZINE  (APRESOLINE ) 50 MG tablet, Take 2 tablets (100 mg total)  by mouth 3 (three) times daily., Disp: 180 tablet, Rfl: 11   hydrocortisone  cream 1 %, Apply 1 Application topically 2 (two) times daily as needed for itching., Disp: 45 g, Rfl: 1   isosorbide  mononitrate (IMDUR ) 60 MG 24 hr tablet, Take 1.5 tablets (90 mg total) by mouth daily., Disp: 180 tablet, Rfl: 3   linagliptin  (TRADJENTA ) 5 MG TABS tablet, Take 1 tablet (5 mg total) by mouth daily. Due for follow up, Disp: 30 tablet, Rfl: 0   sacubitril -valsartan  (ENTRESTO ) 97-103 MG, Take 1 tablet by mouth 2 (two) times daily., Disp: 60 tablet, Rfl: 3   spironolactone  (ALDACTONE ) 25 MG tablet, Take 0.5 tablets (12.5 mg total) by mouth daily., Disp: 45 tablet, Rfl: 3   Tafamidis  (VYNDAMAX ) 61 MG CAPS, Take 1 capsule by mouth daily., Disp: 30 capsule, Rfl: 11   torsemide  (DEMADEX ) 20 MG tablet, Take 1 tablet (20 mg total) by mouth daily., Disp: 30 tablet, Rfl: 6   Semaglutide  (RYBELSUS ) 3 MG TABS, Take 1 tablet (3 mg total) by mouth daily. (Patient not taking: Reported on 03/14/2024), Disp: 30 tablet, Rfl: 4 Allergies  Allergen Reactions   Bee Venom Anaphylaxis, Swelling and Other (See Comments)    Swells all over     Social History   Socioeconomic History   Marital status: Single    Spouse name: Not on file   Number of children: Not on file   Years of education: Not on file   Highest education level: Not on file  Occupational History   Not on file  Tobacco Use   Smoking status: Former    Current packs/day: 0.00    Types: Cigarettes    Quit date: 10/06/2017    Years since quitting: 6.4   Smokeless tobacco: Never  Substance and Sexual Activity   Alcohol use: Yes    Comment: Occasional Beer   Drug use: Yes    Types: Marijuana   Sexual activity: Not on file  Other Topics Concern   Not on file  Social History Narrative   Right Handed.    Lives in a two story home    Lives with 15 year old son.    Social Drivers of Corporate investment banker Strain: Low Risk  (01/18/2022)   Overall  Financial Resource Strain (CARDIA)    Difficulty of Paying Living Expenses: Not hard at all  Food Insecurity: No Food Insecurity (09/21/2022)   Hunger Vital Sign    Worried About Running Out of Food in the Last Year: Never true    Ran Out of Food in the Last Year: Never true  Transportation Needs: No Transportation Needs (06/04/2022)   PRAPARE - Administrator, Civil Service (Medical): No    Lack of Transportation (Non-Medical): No  Physical Activity: Inactive (01/18/2022)   Exercise Vital Sign    Days of Exercise per Week: 0 days    Minutes of Exercise per Session: 0 min  Stress: Stress Concern Present (01/18/2022)   [employer]  of Occupational Health - Occupational Stress Questionnaire    Feeling of Stress : To some extent  Social Connections: Socially Isolated (01/18/2022)   Social Connection and Isolation Panel    Frequency of Communication with Friends and Family: More than three times a week    Frequency of Social Gatherings with Friends and Family: More than three times a week    Attends Religious Services: Never    Database administrator or Organizations: No    Attends Banker Meetings: Never    Marital Status: Never married  Intimate Partner Violence: Not At Risk (01/18/2022)   Humiliation, Afraid, Rape, and Kick questionnaire    Fear of Current or Ex-Partner: No    Emotionally Abused: No    Physically Abused: No    Sexually Abused: No    Physical Exam      Future Appointments  Date Time Provider Department Center  03/15/2024 11:00 AM [facility] ECHO OP 1 [facility]  03/15/2024 12:00 PM [facility] PA/NP [facility] None

## 2024-03-14 NOTE — Telephone Encounter (Signed)
 Called to confirm/remind patient of their appointment at [facility] on 03/15/24.   Appointment:   [] Confirmed  [] Left mess   [] No answer/No voice mail  [x] VM Full/unable to leave message  [] Phone not in service

## 2024-03-14 NOTE — Progress Notes (Signed)
 Advanced Heart Failure Clinic Note   Primary Care: Dr. Betty [name] HF Cardiologist: Dr. Cherrie  Reason for Visit: Heart Failure Follow-up HPI: Thomas West is a 63 y.o. male with history of CAD with prior MI and stent in 01/2019 at [facility] in [place] (report not available), cardiomyopathy/chronic systolic HF, uncontrolled DM II, HTN, hx CVA on chart review, CKD.    Had not medical f/u until he was admitted in 10/22 with a/c systolic HF. Had been out of cardiac medications. Diuresed and started on GDMT with carvedilol , hydralazine , imdur  and empagliflozin .    Echo 10/22 with EF 20-25%, RV okay, trivial MR. [name] 10/22 with nonobstructive CAD and patent RPLV stent. RA 5, PCWP 5, CO/CI (fick) 7.46/3.85.   He did not show up for [facility] appointment after discharge.   cMRI (11/22): LVEF 24% RVEF 21% LGE and ECW suggestive of cardiac amyloidosis. 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  Admitted 12/22 with a/c CHF due to noncompliance with GDMT. GDMT titrated. Admitted 2/23 with a/c CHF after running out of meds x 1 week. GDMT restarted. Seen in ED 3/23 with back pain, felt to be related to MSK. Seen in ED 08/18/21 with SOB, cardiac work up unrevealing.  Admitted 7/23 post fall w/ a/c CHF c/b AKI. Enrolled in paramedicine at discharge. Had been seen following day doing well on all meds. [place], inevitably was readmitted 7/23 w/ persistent N/V/D with intermittent CP and SOB. Of note, presented with AKI (Cr 3.3) 2/2 emesis/diarrhea. Given IVF, and resumed on GDMT.   Admitted 8/23 with a/c CHF. Diuresed with IV lasix . Hospitalization c/b with AKI, GDMT initially held; Entresto  resumed at low dose, torsemide  on MWF. Discharged weight 145 lbs.  Seen in ED 1/25 with CHF exacerbation. Torsemide  increased to daily. Doing well at follow up [facility] 06/18/23.   Today he returns for AHF follow up. Overall feeling great. Denies palpitations, CP, dizziness,  edema, or PND/Orthopnea. No SOB. Appetite ok. No fever or chills. Weighs with [name].  Reports taking all medications but intt doses missed noted on paramedicine reports. Denies tobacco use. Drinks ETOH every now and then as well as smokes marijuana. Goes bowling frequently with family.   Cardiac Studies: - Echo (9/24) EF 45%  - Echo (6/23): EF 30-35%, LV global hypokinesis, Grade 1 DD, RV function normal - PYP (12/22): suggestive to TTR amyloid and may benefit from addition of tafamadis as outpatient.  - cMRI (11/22): LVEF 24% RVEF 21% LGE and ECW suggestive of cardiac amyloidosis  ROS: All systems reviewed and negative except as per HPI.   Past Medical History:  Diagnosis Date   CHF (congestive heart failure) (HCC)    Coronary artery disease    Diabetes mellitus without complication (HCC)    History of MI (myocardial infarction) 03/05/2019   Hypertension    Stroke [facility])    Current Outpatient Medications  Medication Sig Dispense Refill   albuterol  (VENTOLIN  HFA) 108 (90 Base) MCG/ACT inhaler Inhale 1-2 puffs into the lungs every 6 (six) hours for 1 week, then as needed. 6.7 g 0   amLODipine  (NORVASC ) 5 MG tablet Take 0.5 tablets (2.5 mg total) by mouth daily. 30 tablet 3   aspirin  EC 81 MG tablet Take 1 tablet (81 mg total) by mouth daily. Swallow whole. 30 tablet 11   atorvastatin  (LIPITOR ) 80 MG tablet Take 1 tablet (80 mg total) by mouth daily. 90 tablet 3   carvedilol  (COREG ) 12.5 MG tablet Take  1 tablet (12.5 mg total) by mouth 2 (two) times daily. 180 tablet 3   clopidogrel  (PLAVIX ) 75 MG tablet Take 1 tablet (75 mg total) by mouth daily. 90 tablet 2   cyanocobalamin  (VITAMIN B12) 1000 MCG tablet Take 1 tablet (1,000 mcg total) by mouth daily. 130 tablet 3   DULoxetine  (CYMBALTA ) 30 MG capsule Take 1 capsule (30 mg total) by mouth daily. 90 capsule 2   empagliflozin  (JARDIANCE ) 25 MG TABS tablet Take 1 tablet (25 mg total) by mouth daily before breakfast. 90 tablet 2   ezetimibe   (ZETIA ) 10 MG tablet Take 1 tablet (10 mg total) by mouth daily. 90 tablet 0   hydrALAZINE  (APRESOLINE ) 50 MG tablet Take 2 tablets (100 mg total) by mouth 3 (three) times daily. 180 tablet 11   hydrocortisone  cream 1 % Apply 1 Application topically 2 (two) times daily as needed for itching. 45 g 1   isosorbide  mononitrate (IMDUR ) 60 MG 24 hr tablet Take 1.5 tablets (90 mg total) by mouth daily. 180 tablet 3   linagliptin  (TRADJENTA ) 5 MG TABS tablet Take 1 tablet (5 mg total) by mouth daily. Due for follow up 30 tablet 0   sacubitril -valsartan  (ENTRESTO ) 97-103 MG Take 1 tablet by mouth 2 (two) times daily. 60 tablet 3   Semaglutide  (RYBELSUS ) 3 MG TABS Take 1 tablet (3 mg total) by mouth daily. 30 tablet 4   spironolactone  (ALDACTONE ) 25 MG tablet Take 0.5 tablets (12.5 mg total) by mouth daily. 45 tablet 3   Tafamidis  (VYNDAMAX ) 61 MG CAPS Take 1 capsule by mouth daily. 30 capsule 11   torsemide  (DEMADEX ) 20 MG tablet Take 1 tablet (20 mg total) by mouth daily. 30 tablet 6   No current facility-administered medications for this encounter.   Allergies  Allergen Reactions   Bee Venom Anaphylaxis, Swelling and Other (See Comments)    Swells all over   Social History   Socioeconomic History   Marital status: Single    Spouse name: Not on file   Number of children: Not on file   Years of education: Not on file   Highest education level: Not on file  Occupational History   Not on file  Tobacco Use   Smoking status: Former    Current packs/day: 0.00    Types: Cigarettes    Quit date: 10/06/2017    Years since quitting: 6.4   Smokeless tobacco: Never  Substance and Sexual Activity   Alcohol use: Yes    Comment: Occasional Beer   Drug use: Yes    Types: Marijuana   Sexual activity: Not on file  Other Topics Concern   Not on file  Social History Narrative   Right Handed.    Lives in a two story home    Lives with 1 year old son.    Social Drivers of Research scientist (physical sciences) Strain: Low Risk  (01/18/2022)   Overall Financial Resource Strain (CARDIA)    Difficulty of Paying Living Expenses: Not hard at all  Food Insecurity: No Food Insecurity (09/21/2022)   Hunger Vital Sign    Worried About Running Out of Food in the Last Year: Never true    Ran Out of Food in the Last Year: Never true  Transportation Needs: No Transportation Needs (06/04/2022)   PRAPARE - Administrator, Civil Service (Medical): No    Lack of Transportation (Non-Medical): No  Physical Activity: Inactive (01/18/2022)   Exercise Vital Sign  Days of Exercise per Week: 0 days    Minutes of Exercise per Session: 0 min  Stress: Stress Concern Present (01/18/2022)   [employer] of Occupational Health - Occupational Stress Questionnaire    Feeling of Stress : To some extent  Social Connections: Socially Isolated (01/18/2022)   Social Connection and Isolation Panel    Frequency of Communication with Friends and Family: More than three times a week    Frequency of Social Gatherings with Friends and Family: More than three times a week    Attends Religious Services: Never    Database administrator or Organizations: No    Attends Banker Meetings: Never    Marital Status: Never married  Intimate Partner Violence: Not At Risk (01/18/2022)   Humiliation, Afraid, Rape, and Kick questionnaire    Fear of Current or Ex-Partner: No    Emotionally Abused: No    Physically Abused: No    Sexually Abused: No   Family History  Problem Relation Age of Onset   Diabetes Mother    Kidney failure Mother    BP (!) 122/91   Pulse 80   Ht 5' 9 (1.753 m)   Wt 76.3 kg (168 lb 3.2 oz)   SpO2 94%   BMI 24.84 kg/m   Wt Readings from Last 3 Encounters:  03/15/24 76.3 kg (168 lb 3.2 oz)  03/14/24 74.9 kg (165 lb 3.2 oz)  02/24/24 73.2 kg (161 lb 6.4 oz)   PHYSICAL EXAM: General:  well appearing.  No respiratory difficulty. Arrived in [facility] Neck: JVD ~6 cm.  Cor: Regular  rate & rhythm. No murmurs. Lungs: clear Extremities: no edema  Neuro: alert & oriented x 3. Affect pleasant.   ASSESSMENT & PLAN: Chronic Systolic HF/Cardiac amyloidosis - Onset not certain. He reports CM diagnosed just prior to MI while living in [place] a couple of years ago. - Echo (10/22): EF 20-25%, RV ok - R/LHC (10/22): Patent RPLV stent with nonobstructive disease elsewhere, Preserved CO/CI, RA mean 5 mmHg, PCWP mean 5 mmHg - cMRI (11/22): LVEF 24% RVEF 21% LGE and ECW suggestive of cardiac amyloidosis. 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 Multiple myeloma panel (12/22) with no m-spike. 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 Improved to 45% in (9/24). - Much improved NYHA II. Limited by neuropathy. Volume stable.  - Continue torsemide  20 mg daily. BMET/BNP today - Continue hydralazine  100 mg tid + Imdur  90 mg daily.  - Continue Entresto  97/103 mg bid. - Continue Jardiance  25 mg daily. No GU symptoms. - Continue carvedilol  12.5 mg bid.  - Continue spironolactone  12.5 mg daily.   - Continue Tafamadis 61 mg daily - Consider addition of silencer when approved for non-familial - Echo today reviewed with Dr. Zenaida EF ~25-30%. Full read pending.   2. wtTTR cardiac amyloidosis - Continue tafamadis. - Has seen Dr. Danika Patel  - Consider addition of silencer when approved for non-familial  3. CAD - Prior MI followed by PCI in [place]  in either 2019 or 2020.  - Patent RPLV stent on [facility] 10/22. Also had 20% distal RCA, 30% p LAD, 70% d LAD, 60% OM2 - Stent placed 2020. - No s/s angina  - Continue atorva 80 mg + ASA.    4. HTN - Blood pressure soft. No dizziness/lightheadedness. BP followed by Paramedic - Continue Imdur  as above - Continue amlodipine  2.5 mg daily  5. T2DM - Prev uncontrolled, however A1c has been in 7s for over a year. - Continue Jardiance .  6. Neuropathy - Unclear if due to  DM2 or TTR - Saw Dr. Tobie in Neurology as above, with wild type TTR, current therapies not approved. - Continue supportive care.  7. CKD stage IIIb - Baseline SCr 1.7-2.0 - Continue Jardiance  - BMET today.   8. SDOH - He has Medicaid and disability. - Transportation is an issue, he has a car but unable to drive (needs license). - 42 year old son helps with meds. - Enrolled in Paramedicine, appreciate their assistance  Follow up in 4 months with Dr. Cherrie Beckey LITTIE Hayes, NP  1:40 PM

## 2024-03-15 ENCOUNTER — Ambulatory Visit (HOSPITAL_COMMUNITY)
Admission: RE | Admit: 2024-03-15 | Discharge: 2024-03-15 | Disposition: A | Discharge status: Home / Self Care | Source: Ambulatory Visit | Attending: Internal Medicine | Admitting: Internal Medicine

## 2024-03-15 ENCOUNTER — Ambulatory Visit (HOSPITAL_BASED_OUTPATIENT_CLINIC_OR_DEPARTMENT_OTHER)
Admission: RE | Admit: 2024-03-15 | Discharge: 2024-03-15 | Disposition: A | Discharge status: Home / Self Care | Source: Ambulatory Visit | Attending: Internal Medicine | Admitting: Internal Medicine

## 2024-03-15 ENCOUNTER — Encounter (HOSPITAL_COMMUNITY): Payer: Self-pay

## 2024-03-15 ENCOUNTER — Ambulatory Visit (HOSPITAL_COMMUNITY): Payer: Self-pay | Admitting: Internal Medicine

## 2024-03-15 VITALS — BP 122/91 | HR 80 | Ht 69.0 in | Wt 168.2 lb

## 2024-03-15 DIAGNOSIS — E1122 Type 2 diabetes mellitus with diabetic chronic kidney disease: Secondary | ICD-10-CM | POA: Diagnosis not present

## 2024-03-15 DIAGNOSIS — I10 Essential (primary) hypertension: Secondary | ICD-10-CM

## 2024-03-15 DIAGNOSIS — Z79899 Other long term (current) drug therapy: Secondary | ICD-10-CM | POA: Insufficient documentation

## 2024-03-15 DIAGNOSIS — Z8419 Family history of other disorders of kidney and ureter: Secondary | ICD-10-CM | POA: Diagnosis not present

## 2024-03-15 DIAGNOSIS — E785 Hyperlipidemia, unspecified: Secondary | ICD-10-CM | POA: Insufficient documentation

## 2024-03-15 DIAGNOSIS — F1090 Alcohol use, unspecified, uncomplicated: Secondary | ICD-10-CM | POA: Diagnosis not present

## 2024-03-15 DIAGNOSIS — Z87891 Personal history of nicotine dependence: Secondary | ICD-10-CM | POA: Insufficient documentation

## 2024-03-15 DIAGNOSIS — Z955 Presence of coronary angioplasty implant and graft: Secondary | ICD-10-CM | POA: Diagnosis not present

## 2024-03-15 DIAGNOSIS — E1140 Type 2 diabetes mellitus with diabetic neuropathy, unspecified: Secondary | ICD-10-CM | POA: Diagnosis not present

## 2024-03-15 DIAGNOSIS — N1832 Chronic kidney disease, stage 3b: Secondary | ICD-10-CM | POA: Diagnosis not present

## 2024-03-15 DIAGNOSIS — I2510 Atherosclerotic heart disease of native coronary artery without angina pectoris: Secondary | ICD-10-CM | POA: Insufficient documentation

## 2024-03-15 DIAGNOSIS — I5022 Chronic systolic (congestive) heart failure: Secondary | ICD-10-CM

## 2024-03-15 DIAGNOSIS — E11610 Type 2 diabetes mellitus with diabetic neuropathic arthropathy: Secondary | ICD-10-CM

## 2024-03-15 DIAGNOSIS — Z7982 Long term (current) use of aspirin: Secondary | ICD-10-CM | POA: Insufficient documentation

## 2024-03-15 DIAGNOSIS — N183 Chronic kidney disease, stage 3 unspecified: Secondary | ICD-10-CM

## 2024-03-15 DIAGNOSIS — I43 Cardiomyopathy in diseases classified elsewhere: Secondary | ICD-10-CM

## 2024-03-15 DIAGNOSIS — E854 Organ-limited amyloidosis: Secondary | ICD-10-CM

## 2024-03-15 DIAGNOSIS — Z833 Family history of diabetes mellitus: Secondary | ICD-10-CM | POA: Diagnosis not present

## 2024-03-15 DIAGNOSIS — F1290 Cannabis use, unspecified, uncomplicated: Secondary | ICD-10-CM | POA: Diagnosis not present

## 2024-03-15 DIAGNOSIS — Z139 Encounter for screening, unspecified: Secondary | ICD-10-CM

## 2024-03-15 DIAGNOSIS — Z7984 Long term (current) use of oral hypoglycemic drugs: Secondary | ICD-10-CM | POA: Diagnosis not present

## 2024-03-15 DIAGNOSIS — I252 Old myocardial infarction: Secondary | ICD-10-CM | POA: Diagnosis not present

## 2024-03-15 DIAGNOSIS — I130 Hypertensive heart and chronic kidney disease with heart failure and stage 1 through stage 4 chronic kidney disease, or unspecified chronic kidney disease: Secondary | ICD-10-CM | POA: Diagnosis present

## 2024-03-15 DIAGNOSIS — G629 Polyneuropathy, unspecified: Secondary | ICD-10-CM

## 2024-03-15 LAB — BRAIN NATRIURETIC PEPTIDE: B Natriuretic Peptide: 79.2 pg/mL (ref 0.0–100.0)

## 2024-03-15 LAB — BASIC METABOLIC PANEL WITH GFR
Anion gap: 9 (ref 5–15)
BUN: 20 mg/dL (ref 6–20)
CO2: 30 mmol/L (ref 22–32)
Calcium: 8.6 mg/dL — ABNORMAL LOW (ref 8.9–10.3)
Chloride: 99 mmol/L (ref 98–111)
Creatinine, Ser: 1.85 mg/dL — ABNORMAL HIGH (ref 0.61–1.24)
GFR, Estimated: 41 mL/min — ABNORMAL LOW (ref >60)
Glucose, Bld: 129 mg/dL — ABNORMAL HIGH (ref 70–99)
Potassium: 4.2 mmol/L (ref 3.5–5.1)
Sodium: 138 mmol/L (ref 135–145)

## 2024-03-15 LAB — ECHOCARDIOGRAM COMPLETE: S' Lateral: 3.6 cm

## 2024-03-15 NOTE — Patient Instructions (Addendum)
 Good to see you today !  Labs done today, your results will be available in MyChart, we will contact you for abnormal readings.  Your physician recommends that you schedule a follow-up appointment  4 months(February) Call office in December to schedule an appointment  If you have any questions or concerns before your next appointment please send us  a message through [place] or call our office at [phone].    TO LEAVE A MESSAGE FOR THE NURSE SELECT OPTION 2, PLEASE LEAVE A MESSAGE INCLUDING: YOUR NAME DATE OF BIRTH CALL BACK NUMBER REASON FOR CALL**this is important as we prioritize the call backs  YOU WILL RECEIVE A CALL BACK THE SAME DAY AS LONG AS YOU CALL BEFORE 4:00 PM  At the Advanced [facility] Failure Clinic, you and your health needs are our priority. As part of our continuing mission to provide you with exceptional heart care, we have created designated Provider Care Teams. These Care Teams include your primary Cardiologist (physician) and Advanced Practice Providers (APPs- Physician Assistants and Nurse Practitioners) who all work together to provide you with the care you need, when you need it.   You may see any of the following providers on your designated Care Team at your next follow up: Dr Toribio Fuel Dr Ezra Shuck Dr. Ria Commander Dr. Morene Brownie Amy Lenetta, NP Caffie Shed, [place] [facility] [place], [place] Beckey Coe, NP [name] Lee, NP Ellouise Class, NP Tinnie Redman, PharmD Jaun Bash, PharmD   Please be sure to bring in all your medications bottles to every appointment.    Thank you for choosing [facility]

## 2024-03-16 ENCOUNTER — Other Ambulatory Visit (HOSPITAL_COMMUNITY): Payer: Self-pay

## 2024-03-16 NOTE — Progress Notes (Signed)
 Specialty Pharmacy Refill Coordination Note  Thomas West is a 63 y.o. male contacted today regarding refills of specialty medication(s) Tafamidis  (Vyndamax )   Patient requested Delivery   Delivery date: 03/23/24   Verified address: [address] [place] [address]  [place] [address]   Medication will be filled on 03/22/24.

## 2024-03-17 ENCOUNTER — Other Ambulatory Visit (HOSPITAL_COMMUNITY): Payer: Self-pay

## 2024-03-17 ENCOUNTER — Other Ambulatory Visit: Payer: Self-pay | Admitting: Family Medicine

## 2024-03-17 DIAGNOSIS — I2510 Atherosclerotic heart disease of native coronary artery without angina pectoris: Secondary | ICD-10-CM

## 2024-03-19 ENCOUNTER — Other Ambulatory Visit: Payer: Self-pay

## 2024-03-19 NOTE — Progress Notes (Signed)
 Clinical Intervention Note  Clinical Intervention Notes: Patient reported missing 7 doses of medication due to being out of town for a funeral. Patient had previously been adherent and has a paramedic that helps with medications. At last visit on 10/23, patient was stable. Patient will work on increased adherence.   Clinical Intervention Outcomes: Improved therapy adherence   [facility] Specialty Pharmacist

## 2024-03-22 ENCOUNTER — Other Ambulatory Visit: Payer: Self-pay

## 2024-03-28 ENCOUNTER — Other Ambulatory Visit (HOSPITAL_COMMUNITY): Payer: Self-pay | Admitting: Emergency Medicine

## 2024-03-28 ENCOUNTER — Other Ambulatory Visit (HOSPITAL_COMMUNITY): Payer: Self-pay

## 2024-03-28 ENCOUNTER — Other Ambulatory Visit: Payer: Self-pay

## 2024-03-28 ENCOUNTER — Other Ambulatory Visit: Payer: Self-pay | Admitting: Family Medicine

## 2024-03-28 DIAGNOSIS — I2510 Atherosclerotic heart disease of native coronary artery without angina pectoris: Secondary | ICD-10-CM

## 2024-03-28 MED ORDER — CLOPIDOGREL BISULFATE 75 MG PO TABS
75.0000 mg | ORAL_TABLET | Freq: Every day | ORAL | 2 refills | Status: AC
  Filled 2024-03-28: qty 90, 90d supply, fill #0
  Filled 2024-06-20: qty 90, 90d supply, fill #1

## 2024-03-28 NOTE — Progress Notes (Signed)
 Paramedicine Encounter    Patient ID: Thomas West, male    DOB: 1960-10-06, 63 y.o.   MRN: [id number]   Complaints productive cough - thick white secretions  Assessment A&O x 4, skin W&D w/ good color.  Denies chest pain.  He does have expiratory wheezes bilat.  A productive cough w/ thick white secretions.  He is afebrile.   Compliance with meds YES  Pill box filled x 2 weeks  Refills needed Torsemide   Meds changes since last visit NONE    Social changes NONE   BP 120/82 (BP Location: Left Arm, Patient Position: Sitting, Cuff Size: Normal)   Pulse 90   Wt 167 lb (75.8 kg)   SpO2 93%   BMI 24.66 kg/m  Weight yesterday- Last visit weight-165.2lb    Reconciled med box x 2 weeks.  He seems to have misplaced his Atorvastatin , Isosorbide  and Duloxetine .  He had enough to do a 2 week regimen but I have tasked him with looking for those meds.  Refill called in for his Torsemide .  Pt denies chest pain or SOB.  But he does have expiratory wheezes bilaterally.  He did utilize his Albuterol  inhaler during my visit w/ him. Next home visit 11/13 @ 1:00.  ACTION: Home visit completed  [name] [phone] 03/28/24  Patient Care Team: Jordan, [name], MD as PCP - General (Family Medicine) Bensimhon, Toribio SAUNDERS, MD as PCP - Cardiology (Cardiology) Tobie Tonita POUR, DO as Consulting Physician (Neurology) Lionell Jon DEL, [facility] (Pharmacist) Lionell Jon DEL, [facility] (Pharmacist)  Patient Active Problem List   Diagnosis Date Noted   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   Pruritic rash 05/25/2022   Polyneuropathy associated with underlying disease 01/15/2022   Atherosclerosis of aorta 01/15/2022   History of CVA (cerebrovascular accident) without residual deficits 12/24/2021   AKI (acute kidney injury) 12/13/2021   Diarrhea 12/13/2021   Shortness of breath 12/13/2021   Chest pain 12/13/2021   Nausea and vomiting 12/13/2021   Heart failure (HCC) 12/08/2021    Hypertension associated with diabetes (HCC) 07/12/2021   Chronic kidney disease, stage 3a (HCC) 07/12/2021   Hyperkalemia 07/12/2021   CHF exacerbation (HCC) 04/20/2021   HFrEF (heart failure with reduced ejection fraction) (HCC) 02/03/2021   Diabetes mellitus (HCC) 03/27/2019   Hyperlipidemia associated with type 2 diabetes mellitus (HCC) 03/27/2019   CAD (coronary artery disease) 03/05/2019   History of MI (myocardial infarction) 03/05/2019   Type 2 diabetes mellitus with diabetic neuropathy, unspecified (HCC) 03/05/2019   HLD (hyperlipidemia) 03/05/2019   GERD (gastroesophageal reflux disease) 03/05/2019   Hypertension, essential, benign 03/05/2019    Current Outpatient Medications:    albuterol  (VENTOLIN  HFA) 108 (90 Base) MCG/ACT inhaler, Inhale 1-2 puffs into the lungs every 6 (six) hours for 1 week, then as needed., Disp: 6.7 g, Rfl: 0   amLODipine  (NORVASC ) 5 MG tablet, Take 0.5 tablets (2.5 mg total) by mouth daily., Disp: 30 tablet, Rfl: 3   aspirin  EC 81 MG tablet, Take 1 tablet (81 mg total) by mouth daily. Swallow whole., Disp: 30 tablet, Rfl: 11   atorvastatin  (LIPITOR ) 80 MG tablet, Take 1 tablet (80 mg total) by mouth daily., Disp: 90 tablet, Rfl: 3   carvedilol  (COREG ) 12.5 MG tablet, Take 1 tablet (12.5 mg total) by mouth 2 (two) times daily., Disp: 180 tablet, Rfl: 3   cyanocobalamin  (VITAMIN B12) 1000 MCG tablet, Take 1 tablet (1,000 mcg total) by mouth daily., Disp: 130 tablet, Rfl: 3   DULoxetine  (CYMBALTA ) 30  MG capsule, Take 1 capsule (30 mg total) by mouth daily., Disp: 90 capsule, Rfl: 2   empagliflozin  (JARDIANCE ) 25 MG TABS tablet, Take 1 tablet (25 mg total) by mouth daily before breakfast., Disp: 90 tablet, Rfl: 2   ezetimibe  (ZETIA ) 10 MG tablet, Take 1 tablet (10 mg total) by mouth daily., Disp: 90 tablet, Rfl: 0   hydrALAZINE  (APRESOLINE ) 50 MG tablet, Take 2 tablets (100 mg total) by mouth 3 (three) times daily., Disp: 180 tablet, Rfl: 11   hydrocortisone   cream 1 %, Apply 1 Application topically 2 (two) times daily as needed for itching., Disp: 45 g, Rfl: 1   isosorbide  mononitrate (IMDUR ) 60 MG 24 hr tablet, Take 1.5 tablets (90 mg total) by mouth daily., Disp: 180 tablet, Rfl: 3   linagliptin  (TRADJENTA ) 5 MG TABS tablet, Take 1 tablet (5 mg total) by mouth daily. Due for follow up, Disp: 30 tablet, Rfl: 0   sacubitril -valsartan  (ENTRESTO ) 97-103 MG, Take 1 tablet by mouth 2 (two) times daily., Disp: 60 tablet, Rfl: 3   spironolactone  (ALDACTONE ) 25 MG tablet, Take 0.5 tablets (12.5 mg total) by mouth daily., Disp: 45 tablet, Rfl: 3   Tafamidis  (VYNDAMAX ) 61 MG CAPS, Take 1 capsule by mouth daily., Disp: 30 capsule, Rfl: 11   torsemide  (DEMADEX ) 20 MG tablet, Take 1 tablet (20 mg total) by mouth daily., Disp: 30 tablet, Rfl: 6   clopidogrel  (PLAVIX ) 75 MG tablet, Take 1 tablet (75 mg total) by mouth daily., Disp: 90 tablet, Rfl: 2   Semaglutide  (RYBELSUS ) 3 MG TABS, Take 1 tablet (3 mg total) by mouth daily. (Patient not taking: Reported on 03/28/2024), Disp: 30 tablet, Rfl: 4 Allergies  Allergen Reactions   Bee Venom Anaphylaxis, Swelling and Other (See Comments)    Swells all over     Social History   Socioeconomic History   Marital status: Single    Spouse name: Not on file   Number of children: Not on file   Years of education: Not on file   Highest education level: Not on file  Occupational History   Not on file  Tobacco Use   Smoking status: Former    Current packs/day: 0.00    Types: Cigarettes    Quit date: 10/06/2017    Years since quitting: 6.4   Smokeless tobacco: Never  Substance and Sexual Activity   Alcohol use: Yes    Comment: Occasional Beer   Drug use: Yes    Types: Marijuana   Sexual activity: Not on file  Other Topics Concern   Not on file  Social History Narrative   Right Handed.    Lives in a two story home    Lives with 43 year old son.    Social Drivers of Corporate Investment Banker Strain: Low  Risk  (01/18/2022)   Overall Financial Resource Strain (CARDIA)    Difficulty of Paying Living Expenses: Not hard at all  Food Insecurity: No Food Insecurity (09/21/2022)   Hunger Vital Sign    Worried About Running Out of Food in the Last Year: Never true    Ran Out of Food in the Last Year: Never true  Transportation Needs: No Transportation Needs (06/04/2022)   PRAPARE - Administrator, Civil Service (Medical): No    Lack of Transportation (Non-Medical): No  Physical Activity: Inactive (01/18/2022)   Exercise Vital Sign    Days of Exercise per Week: 0 days    Minutes of Exercise per Session: 0  min  Stress: Stress Concern Present (01/18/2022)   [employer] of Occupational Health - Occupational Stress Questionnaire    Feeling of Stress : To some extent  Social Connections: Socially Isolated (01/18/2022)   Social Connection and Isolation Panel    Frequency of Communication with Friends and Family: More than three times a week    Frequency of Social Gatherings with Friends and Family: More than three times a week    Attends Religious Services: Never    Database Administrator or Organizations: No    Attends Banker Meetings: Never    Marital Status: Never married  Intimate Partner Violence: Not At Risk (01/18/2022)   Humiliation, Afraid, Rape, and Kick questionnaire    Fear of Current or Ex-Partner: No    Emotionally Abused: No    Physically Abused: No    Sexually Abused: No    Physical Exam      No future appointments.

## 2024-03-29 ENCOUNTER — Other Ambulatory Visit (HOSPITAL_COMMUNITY): Payer: Self-pay

## 2024-03-29 ENCOUNTER — Other Ambulatory Visit: Payer: Self-pay | Admitting: Family Medicine

## 2024-03-29 MED ORDER — LINAGLIPTIN 5 MG PO TABS
5.0000 mg | ORAL_TABLET | Freq: Every day | ORAL | 0 refills | Status: AC
  Filled 2024-03-29 – 2024-03-30 (×2): qty 30, 30d supply, fill #0

## 2024-03-29 NOTE — Telephone Encounter (Signed)
 Copied from CRM #[id number]. Topic: Clinical - Medication Refill >> Mar 29, 2024 10:56 AM Rea ORN wrote: Medication:  linagliptin  (TRADJENTA ) 5 MG TABS tablet   Has the patient contacted their pharmacy? No (Agent: If no, request that the patient contact the pharmacy for the refill. If patient does not wish to contact the pharmacy document the reason why and proceed with request.) (Agent: If yes, when and what did the pharmacy advise?)  This is the patient's preferred pharmacy:  [facility] [address] [place] [address] Phone: [phone] Fax: [phone]   Is this the correct pharmacy for this prescription? Yes If no, delete pharmacy and type the correct one.   Has the prescription been filled recently? No  Is the patient out of the medication? Yes  Has the patient been seen for an appointment in the last year OR does the patient have an upcoming appointment? Yes  Can we respond through MyChart? No  Agent: Please be advised that Rx refills may take up to 3 business days. We ask that you follow-up with your pharmacy.

## 2024-03-30 ENCOUNTER — Other Ambulatory Visit: Payer: Self-pay

## 2024-03-30 ENCOUNTER — Other Ambulatory Visit (HOSPITAL_COMMUNITY): Payer: Self-pay

## 2024-04-10 ENCOUNTER — Other Ambulatory Visit: Payer: Self-pay

## 2024-04-11 ENCOUNTER — Other Ambulatory Visit (HOSPITAL_COMMUNITY): Payer: Self-pay | Admitting: Emergency Medicine

## 2024-04-11 NOTE — Progress Notes (Unsigned)
 Paramedicine Encounter    Patient ID: Thomas West, male    DOB: March 24, 1961, 63 y.o.   MRN: [id number]   Complaints***  Assessment***  Compliance with meds***  Pill box filled***  Refills needed***  Meds changes since last visit***    Social changes***   BP (!) 180/0 (BP Location: Left Arm, Patient Position: Sitting, Cuff Size: Normal) Comment: palpated  Pulse 88   Wt 170 lb 9.6 oz (77.4 kg)   SpO2 99%   BMI 25.19 kg/m  Weight yesterday- not taken Last visit weight-167lb  ACTION: {Paramed [phone]}  [name], EMT-Paramedic [phone] 04/11/24  Patient Care Team: Jordan, [name], MD as PCP - General (Family Medicine) Bensimhon, Toribio SAUNDERS, MD as PCP - Cardiology (Cardiology) Tobie Tonita POUR, DO as Consulting Physician (Neurology) Lionell Jon DEL, [facility] (Pharmacist) Lionell Jon DEL, [facility] (Pharmacist)  Patient Active Problem List   Diagnosis Date Noted   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   Pruritic rash 05/25/2022   Polyneuropathy associated with underlying disease 01/15/2022   Atherosclerosis of aorta 01/15/2022   History of CVA (cerebrovascular accident) without residual deficits 12/24/2021   AKI (acute kidney injury) 12/13/2021   Diarrhea 12/13/2021   Shortness of breath 12/13/2021   Chest pain 12/13/2021   Nausea and vomiting 12/13/2021   Heart failure (HCC) 12/08/2021   Hypertension associated with diabetes (HCC) 07/12/2021   Chronic kidney disease, stage 3a (HCC) 07/12/2021   Hyperkalemia 07/12/2021   CHF exacerbation (HCC) 04/20/2021   HFrEF (heart failure with reduced ejection fraction) (HCC) 02/03/2021   Diabetes mellitus (HCC) 03/27/2019   Hyperlipidemia associated with type 2 diabetes mellitus (HCC) 03/27/2019   CAD (coronary artery disease) 03/05/2019   History of MI (myocardial infarction) 03/05/2019   Type 2 diabetes mellitus with diabetic neuropathy, unspecified (HCC) 03/05/2019   HLD (hyperlipidemia) 03/05/2019   GERD  (gastroesophageal reflux disease) 03/05/2019   Hypertension, essential, benign 03/05/2019    Current Outpatient Medications:    albuterol  (VENTOLIN  HFA) 108 (90 Base) MCG/ACT inhaler, Inhale 1-2 puffs into the lungs every 6 (six) hours for 1 week, then as needed., Disp: 6.7 g, Rfl: 0   amLODipine  (NORVASC ) 5 MG tablet, Take 0.5 tablets (2.5 mg total) by mouth daily., Disp: 30 tablet, Rfl: 3   aspirin  EC 81 MG tablet, Take 1 tablet (81 mg total) by mouth daily. Swallow whole., Disp: 30 tablet, Rfl: 11   atorvastatin  (LIPITOR ) 80 MG tablet, Take 1 tablet (80 mg total) by mouth daily., Disp: 90 tablet, Rfl: 3   carvedilol  (COREG ) 12.5 MG tablet, Take 1 tablet (12.5 mg total) by mouth 2 (two) times daily., Disp: 180 tablet, Rfl: 3   clopidogrel  (PLAVIX ) 75 MG tablet, Take 1 tablet (75 mg total) by mouth daily., Disp: 90 tablet, Rfl: 2   cyanocobalamin  (VITAMIN B12) 1000 MCG tablet, Take 1 tablet (1,000 mcg total) by mouth daily., Disp: 130 tablet, Rfl: 3   DULoxetine  (CYMBALTA ) 30 MG capsule, Take 1 capsule (30 mg total) by mouth daily., Disp: 90 capsule, Rfl: 2   empagliflozin  (JARDIANCE ) 25 MG TABS tablet, Take 1 tablet (25 mg total) by mouth daily before breakfast., Disp: 90 tablet, Rfl: 2   ezetimibe  (ZETIA ) 10 MG tablet, Take 1 tablet (10 mg total) by mouth daily., Disp: 90 tablet, Rfl: 0   hydrALAZINE  (APRESOLINE ) 50 MG tablet, Take 2 tablets (100 mg total) by mouth 3 (three) times daily., Disp: 180 tablet, Rfl: 11   hydrocortisone  cream 1 %, Apply 1 Application topically 2 (two) times daily  as needed for itching., Disp: 45 g, Rfl: 1   isosorbide  mononitrate (IMDUR ) 60 MG 24 hr tablet, Take 1.5 tablets (90 mg total) by mouth daily., Disp: 180 tablet, Rfl: 3   linagliptin  (TRADJENTA ) 5 MG TABS tablet, Take 1 tablet (5 mg total) by mouth daily. Due for follow up, Disp: 30 tablet, Rfl: 0   sacubitril -valsartan  (ENTRESTO ) 97-103 MG, Take 1 tablet by mouth 2 (two) times daily., Disp: 60 tablet, Rfl:  3   spironolactone  (ALDACTONE ) 25 MG tablet, Take 0.5 tablets (12.5 mg total) by mouth daily., Disp: 45 tablet, Rfl: 3   Tafamidis  (VYNDAMAX ) 61 MG CAPS, Take 1 capsule by mouth daily., Disp: 30 capsule, Rfl: 11   torsemide  (DEMADEX ) 20 MG tablet, Take 1 tablet (20 mg total) by mouth daily., Disp: 30 tablet, Rfl: 6   Semaglutide  (RYBELSUS ) 3 MG TABS, Take 1 tablet (3 mg total) by mouth daily. (Patient not taking: Reported on 04/11/2024), Disp: 30 tablet, Rfl: 4 Allergies  Allergen Reactions   Bee Venom Anaphylaxis, Swelling and Other (See Comments)    Swells all over     Social History   Socioeconomic History   Marital status: Single    Spouse name: Not on file   Number of children: Not on file   Years of education: Not on file   Highest education level: Not on file  Occupational History   Not on file  Tobacco Use   Smoking status: Former    Current packs/day: 0.00    Types: Cigarettes    Quit date: 10/06/2017    Years since quitting: 6.5   Smokeless tobacco: Never  Substance and Sexual Activity   Alcohol use: Yes    Comment: Occasional Beer   Drug use: Yes    Types: Marijuana   Sexual activity: Not on file  Other Topics Concern   Not on file  Social History Narrative   Right Handed.    Lives in a two story home    Lives with 76 year old son.    Social Drivers of Corporate Investment Banker Strain: Low Risk  (01/18/2022)   Overall Financial Resource Strain (CARDIA)    Difficulty of Paying Living Expenses: Not hard at all  Food Insecurity: No Food Insecurity (09/21/2022)   Hunger Vital Sign    Worried About Running Out of Food in the Last Year: Never true    Ran Out of Food in the Last Year: Never true  Transportation Needs: No Transportation Needs (06/04/2022)   PRAPARE - Administrator, Civil Service (Medical): No    Lack of Transportation (Non-Medical): No  Physical Activity: Inactive (01/18/2022)   Exercise Vital Sign    Days of Exercise per Week: 0  days    Minutes of Exercise per Session: 0 min  Stress: Stress Concern Present (01/18/2022)   [employer] of Occupational Health - Occupational Stress Questionnaire    Feeling of Stress : To some extent  Social Connections: Socially Isolated (01/18/2022)   Social Connection and Isolation Panel    Frequency of Communication with Friends and Family: More than three times a week    Frequency of Social Gatherings with Friends and Family: More than three times a week    Attends Religious Services: Never    Database Administrator or Organizations: No    Attends Banker Meetings: Never    Marital Status: Never married  Intimate Partner Violence: Not At Risk (01/18/2022)   Humiliation, Afraid, Rape,  and Kick questionnaire    Fear of Current or Ex-Partner: No    Emotionally Abused: No    Physically Abused: No    Sexually Abused: No    Physical Exam      No future appointments.

## 2024-04-12 ENCOUNTER — Other Ambulatory Visit: Payer: Self-pay

## 2024-04-12 ENCOUNTER — Other Ambulatory Visit (HOSPITAL_COMMUNITY): Payer: Self-pay

## 2024-04-12 NOTE — Progress Notes (Signed)
 Specialty Pharmacy Ongoing Clinical Assessment Note  Thomas West is a 63 y.o. male who is being followed by the specialty pharmacy service for RxSp Cardiology   Patient's specialty medication(s) reviewed today: Tafamidis  (Vyndamax )   Missed doses in the last 4 weeks: -- (Thomas West is unsure how many as patient went away without his medications)   Patient/Caregiver did not have any additional questions or concerns.   Therapeutic benefit summary: Patient is achieving benefit   Adverse events/side effects summary: No adverse events/side effects   Patient's therapy is appropriate to: Continue    Goals Addressed             This Visit's Progress    Slow Disease Progression   On track    Patient is on track. Patient will maintain adherence         Follow up: 12 months  Thomas West M Marylynn Rigdon Specialty Pharmacist

## 2024-04-12 NOTE — Progress Notes (Signed)
 Specialty Pharmacy Refill Coordination Note  Thomas West is a 63 y.o. male contacted today regarding refills of specialty medication(s) Tafamidis  (Vyndamax )   Patient requested Delivery   Delivery date: 04/24/24   Verified address: [address] [place] [address]   [place] [address]   Medication will be filled on: 04/23/24

## 2024-04-13 ENCOUNTER — Other Ambulatory Visit: Payer: Self-pay

## 2024-04-13 ENCOUNTER — Other Ambulatory Visit (HOSPITAL_COMMUNITY): Payer: Self-pay | Admitting: Cardiology

## 2024-04-13 ENCOUNTER — Other Ambulatory Visit (HOSPITAL_COMMUNITY): Payer: Self-pay

## 2024-04-13 DIAGNOSIS — I5020 Unspecified systolic (congestive) heart failure: Secondary | ICD-10-CM

## 2024-04-13 DIAGNOSIS — E1140 Type 2 diabetes mellitus with diabetic neuropathy, unspecified: Secondary | ICD-10-CM

## 2024-04-13 MED ORDER — EMPAGLIFLOZIN 25 MG PO TABS
25.0000 mg | ORAL_TABLET | Freq: Every day | ORAL | 2 refills | Status: AC
  Filled 2024-04-13: qty 100, 100d supply, fill #0
  Filled 2024-04-16: qty 100, 100d supply, fill #1

## 2024-04-13 NOTE — Telephone Encounter (Signed)
 Insurance called to request #[phone] of jardiance  to local pharmacy

## 2024-04-14 ENCOUNTER — Other Ambulatory Visit: Payer: Self-pay

## 2024-04-14 ENCOUNTER — Emergency Department (HOSPITAL_COMMUNITY)

## 2024-04-14 ENCOUNTER — Encounter (HOSPITAL_COMMUNITY): Payer: Self-pay

## 2024-04-14 ENCOUNTER — Emergency Department (HOSPITAL_COMMUNITY)
Admission: EM | Admit: 2024-04-14 | Discharge: 2024-04-15 | Disposition: A | Discharge status: Home / Self Care | Attending: Emergency Medicine | Admitting: Emergency Medicine

## 2024-04-14 DIAGNOSIS — E119 Type 2 diabetes mellitus without complications: Secondary | ICD-10-CM | POA: Diagnosis not present

## 2024-04-14 DIAGNOSIS — I2510 Atherosclerotic heart disease of native coronary artery without angina pectoris: Secondary | ICD-10-CM | POA: Diagnosis not present

## 2024-04-14 DIAGNOSIS — J40 Bronchitis, not specified as acute or chronic: Secondary | ICD-10-CM | POA: Insufficient documentation

## 2024-04-14 DIAGNOSIS — I110 Hypertensive heart disease with heart failure: Secondary | ICD-10-CM | POA: Diagnosis not present

## 2024-04-14 DIAGNOSIS — Z87891 Personal history of nicotine dependence: Secondary | ICD-10-CM | POA: Diagnosis not present

## 2024-04-14 DIAGNOSIS — I509 Heart failure, unspecified: Secondary | ICD-10-CM | POA: Insufficient documentation

## 2024-04-14 DIAGNOSIS — K5900 Constipation, unspecified: Secondary | ICD-10-CM | POA: Diagnosis not present

## 2024-04-14 DIAGNOSIS — R0602 Shortness of breath: Secondary | ICD-10-CM | POA: Diagnosis present

## 2024-04-14 DIAGNOSIS — Z8673 Personal history of transient ischemic attack (TIA), and cerebral infarction without residual deficits: Secondary | ICD-10-CM | POA: Insufficient documentation

## 2024-04-14 LAB — BASIC METABOLIC PANEL WITH GFR
Anion gap: 13 (ref 5–15)
BUN: 13 mg/dL (ref 8–23)
CO2: 27 mmol/L (ref 22–32)
Calcium: 8.2 mg/dL — ABNORMAL LOW (ref 8.9–10.3)
Chloride: 99 mmol/L (ref 98–111)
Creatinine, Ser: 1.6 mg/dL — ABNORMAL HIGH (ref 0.61–1.24)
GFR, Estimated: 49 mL/min — ABNORMAL LOW (ref >60)
Glucose, Bld: 110 mg/dL — ABNORMAL HIGH (ref 70–99)
Potassium: 4.6 mmol/L (ref 3.5–5.1)
Sodium: 139 mmol/L (ref 135–145)

## 2024-04-14 LAB — CBC
HCT: 49.4 % (ref 39.0–52.0)
Hemoglobin: 15.5 g/dL (ref 13.0–17.0)
MCH: 27 pg (ref 26.0–34.0)
MCHC: 31.4 g/dL (ref 30.0–36.0)
MCV: 85.9 fL (ref 80.0–100.0)
Platelets: 310 K/uL (ref 150–400)
RBC: 5.75 MIL/uL (ref 4.22–5.81)
RDW: 16.7 % — ABNORMAL HIGH (ref 11.5–15.5)
WBC: 8.4 K/uL (ref 4.0–10.5)
nRBC: 0 % (ref 0.0–0.2)

## 2024-04-14 MED ORDER — BISACODYL 10 MG RE SUPP
10.0000 mg | RECTAL | 0 refills | Status: AC | PRN
  Filled 2024-04-14: qty 12, fill #0
  Filled 2024-04-16: qty 12, 12d supply, fill #0

## 2024-04-14 MED ORDER — IPRATROPIUM-ALBUTEROL 0.5-2.5 (3) MG/3ML IN SOLN
3.0000 mL | Freq: Once | RESPIRATORY_TRACT | Status: AC
  Administered 2024-04-14: 3 mL via RESPIRATORY_TRACT
  Filled 2024-04-14: qty 3

## 2024-04-14 MED ORDER — POLYETHYLENE GLYCOL 3350 17 G PO PACK
17.0000 g | PACK | Freq: Every day | ORAL | 1 refills | Status: AC
  Filled 2024-04-14 – 2024-04-16 (×2): qty 30, 30d supply, fill #0
  Filled 2024-04-17: qty 28, 28d supply, fill #0

## 2024-04-14 MED ORDER — PREDNISONE 20 MG PO TABS
40.0000 mg | ORAL_TABLET | Freq: Every day | ORAL | 0 refills | Status: AC
  Filled 2024-04-14 – 2024-04-16 (×2): qty 10, 5d supply, fill #0

## 2024-04-14 NOTE — ED Provider Notes (Signed)
 [facility] Emergency Department Provider Note MRN:  [id number]  Arrival date & time: 04/14/24     Chief Complaint   Shortness of Breath   History of Present Illness   Thomas West is a 63 y.o. year-old male with a history of hypertension, stroke, CHF presenting to the ED with chief complaint of shortness of breath.  Shortness of breath with productive cough over the past few days.  Also with constipation over the past few days.  No chest pain, denies fever, no abdominal pain, no other complaints.  Review of Systems  A thorough review of systems was obtained and all systems are negative except as noted in the HPI and PMH.   Patient's Health History    Past Medical History:  Diagnosis Date   CHF (congestive heart failure) (HCC)    Coronary artery disease    Diabetes mellitus without complication (HCC)    History of MI (myocardial infarction) 03/05/2019   Hypertension    Stroke [facility])     Past Surgical History:  Procedure Laterality Date   leg surgery     pins in left shin   RIGHT/LEFT HEART CATH AND CORONARY ANGIOGRAPHY N/A 03/11/2021   Procedure: RIGHT/LEFT HEART CATH AND CORONARY ANGIOGRAPHY;  Surgeon: Wendel Lurena POUR, MD;  Location: [facility] INVASIVE CV LAB;  Service: Cardiovascular;  Laterality: N/A;    Family History  Problem Relation Age of Onset   Diabetes Mother    Kidney failure Mother     Social History   Socioeconomic History   Marital status: Single    Spouse name: Not on file   Number of children: Not on file   Years of education: Not on file   Highest education level: Not on file  Occupational History   Not on file  Tobacco Use   Smoking status: Former    Current packs/day: 0.00    Types: Cigarettes    Quit date: 10/06/2017    Years since quitting: 6.5   Smokeless tobacco: Never  Substance and Sexual Activity   Alcohol use: Yes    Comment: Occasional Beer   Drug use: Yes    Types: Marijuana   Sexual activity: Not on file   Other Topics Concern   Not on file  Social History Narrative   Right Handed.    Lives in a two story home    Lives with 56 year old son.    Social Drivers of Corporate Investment Banker Strain: Low Risk  (01/18/2022)   Overall Financial Resource Strain (CARDIA)    Difficulty of Paying Living Expenses: Not hard at all  Food Insecurity: No Food Insecurity (09/21/2022)   Hunger Vital Sign    Worried About Running Out of Food in the Last Year: Never true    Ran Out of Food in the Last Year: Never true  Transportation Needs: No Transportation Needs (06/04/2022)   PRAPARE - Administrator, Civil Service (Medical): No    Lack of Transportation (Non-Medical): No  Physical Activity: Inactive (01/18/2022)   Exercise Vital Sign    Days of Exercise per Week: 0 days    Minutes of Exercise per Session: 0 min  Stress: Stress Concern Present (01/18/2022)   [employer] of Occupational Health - Occupational Stress Questionnaire    Feeling of Stress : To some extent  Social Connections: Socially Isolated (01/18/2022)   Social Connection and Isolation Panel    Frequency of Communication with Friends and Family: More than three  times a week    Frequency of Social Gatherings with Friends and Family: More than three times a week    Attends Religious Services: Never    Database Administrator or Organizations: No    Attends Banker Meetings: Never    Marital Status: Never married  Intimate Partner Violence: Not At Risk (01/18/2022)   Humiliation, Afraid, Rape, and Kick questionnaire    Fear of Current or Ex-Partner: No    Emotionally Abused: No    Physically Abused: No    Sexually Abused: No     Physical Exam   Vitals:   04/14/24 2330 04/14/24 2334  BP: (!) 148/94   Pulse: 100 (!) 109  Resp: (!) 22 20  Temp:    SpO2: 100% 98%    CONSTITUTIONAL: Well-appearing, NAD NEURO/PSYCH:  Alert and oriented x 3, no focal deficits EYES:  eyes equal and reactive ENT/NECK:   no LAD, no JVD CARDIO: Regular rate, well-perfused, normal S1 and S2 PULM: Scattered wheezes GI/GU:  non-distended, non-tender MSK/SPINE:  No gross deformities, no edema SKIN:  no rash, atraumatic   *Additional and/or pertinent findings included in MDM below  Diagnostic and Interventional Summary    EKG Interpretation Date/Time:  Saturday April 14 2024 23:36:13 EST Ventricular Rate:  103 PR Interval:  182 QRS Duration:  90 QT Interval:  352 QTC Calculation: 461 R Axis:   -77  Text Interpretation: Sinus tachycardia Inferior infarct, old Lateral leads are also involved Confirmed by Theadore Sharper [phone]) on 04/14/2024 11:42:18 PM       Labs Reviewed  BASIC METABOLIC PANEL WITH GFR - Abnormal; Notable for the following components:      Result Value   Glucose, Bld 110 (*)    Creatinine, Ser 1.60 (*)    Calcium  8.2 (*)    GFR, Estimated 49 (*)    All other components within normal limits  CBC - Abnormal; Notable for the following components:   RDW 16.7 (*)    All other components within normal limits  RESP PANEL BY RT-PCR (RSV, FLU A&B, COVID)  [id number]    DG Chest 2 View  Final Result      Medications  ipratropium-albuterol  (DUONEB) 0.5-2.5 (3) MG/3ML nebulizer solution 3 mL (3 mLs Nebulization Given 04/14/24 2317)     Procedures  /  Critical Care Procedures  ED Course and Medical Decision Making  Initial Impression and Ddx Suspect bronchitis or pneumonia.  CHF exacerbation is another consideration.  Patient is resting comfortably on my evaluation, wakes easily, reassuring vital signs, no hypoxia.  Does have some wheezing, suspect reactive airway component.  Doubt PE, doubt ACS.  Past medical/surgical history that increases complexity of ED encounter: Hypertension, CHF  Interpretation of Diagnostics I personally reviewed the EKG and my interpretation is as follows: Sinus rhythm with no significant change from prior  No significant blood count or electrolyte  disturbance.  Chest x-ray unremarkable.  Patient Reassessment and Ultimate Disposition/Management     Patient doing well after DuoNeb, no indication for further testing or admission.  Discharged home with return precautions.  Patient management required discussion with the following services or consulting groups:  None  Complexity of Problems Addressed Acute illness or injury that poses threat of life of bodily function  Additional Data Reviewed and Analyzed Further history obtained from: Prior labs/imaging results  Additional Factors Impacting ED Encounter Risk Consideration of hospitalization  Sharper HERO. Theadore, MD [facility] Emergency Medicine [facility] [email or web address]  Final Clinical Impressions(s) / ED Diagnoses     ICD-10-CM   1. Bronchitis  J40     2. Constipation, unspecified constipation type  K59.00       ED Discharge Orders          Ordered    predniSONE  (DELTASONE ) 20 MG tablet  Daily        04/14/24 2343    polyethylene glycol (MIRALAX ) 17 g packet  Daily        04/14/24 2343    bisacodyl  (DULCOLAX) 10 MG suppository  As needed        04/14/24 2343             Discharge Instructions Discussed with and Provided to Patient:     Discharge Instructions      You were evaluated in the Emergency Department and after careful evaluation, we did not find any emergent condition requiring admission or further testing in the hospital.  Your exam/testing today is overall reassuring.  Your symptoms may be due to bronchitis.  Recommend use of the prednisone  steroid medication daily.  Continue your inhalers at home.  For your constipation you can use the MiraLAX  up to 3 times daily until you achieve soft frequent stools.  Can use the suppositories as needed as well.  Recommend close follow-up with your primary care doctor within the next 2 weeks.  Please return to the Emergency Department if you experience any worsening of your condition.    Thank you for allowing us  to be a part of your care.       Theadore Ozell HERO, MD 04/14/24 [phone]

## 2024-04-14 NOTE — Discharge Instructions (Signed)
 You were evaluated in the Emergency Department and after careful evaluation, we did not find any emergent condition requiring admission or further testing in the hospital.  Your exam/testing today is overall reassuring.  Your symptoms may be due to bronchitis.  Recommend use of the prednisone  steroid medication daily.  Continue your inhalers at home.  For your constipation you can use the MiraLAX  up to 3 times daily until you achieve soft frequent stools.  Can use the suppositories as needed as well.  Recommend close follow-up with your primary care doctor within the next 2 weeks.  Please return to the Emergency Department if you experience any worsening of your condition.   Thank you for allowing us  to be a part of your care.

## 2024-04-14 NOTE — ED Notes (Signed)
 Patient transported to X-ray

## 2024-04-14 NOTE — ED Triage Notes (Signed)
 Reported SOB x 3 days with productive cough with green phlegm. Hx of CHF, no obvious extremity swelling. Breathing currently unlabored. Denies CP, reports last BM 3 days ago.

## 2024-04-15 LAB — RESP PANEL BY RT-PCR (RSV, FLU A&B, COVID)  RVPGX2
Influenza A by PCR: NEGATIVE
Influenza B by PCR: NEGATIVE
Resp Syncytial Virus by PCR: NEGATIVE
SARS Coronavirus 2 by RT PCR: NEGATIVE

## 2024-04-16 ENCOUNTER — Other Ambulatory Visit: Payer: Self-pay

## 2024-04-16 ENCOUNTER — Other Ambulatory Visit: Payer: Self-pay | Admitting: Family Medicine

## 2024-04-16 ENCOUNTER — Other Ambulatory Visit (HOSPITAL_COMMUNITY): Payer: Self-pay

## 2024-04-16 ENCOUNTER — Other Ambulatory Visit (HOSPITAL_COMMUNITY): Payer: Self-pay | Admitting: Emergency Medicine

## 2024-04-16 MED ORDER — EZETIMIBE 10 MG PO TABS
10.0000 mg | ORAL_TABLET | Freq: Every day | ORAL | 0 refills | Status: AC
  Filled 2024-04-16: qty 90, 90d supply, fill #0

## 2024-04-17 ENCOUNTER — Other Ambulatory Visit (HOSPITAL_COMMUNITY): Payer: Self-pay

## 2024-04-17 ENCOUNTER — Other Ambulatory Visit: Payer: Self-pay

## 2024-04-18 ENCOUNTER — Other Ambulatory Visit (HOSPITAL_COMMUNITY): Payer: Self-pay

## 2024-04-18 ENCOUNTER — Other Ambulatory Visit (HOSPITAL_BASED_OUTPATIENT_CLINIC_OR_DEPARTMENT_OTHER): Payer: Self-pay

## 2024-04-18 NOTE — Progress Notes (Signed)
 Today's visit was med rec only.  Pt had LVM to let me know that he had gone to the hospital on the 22nd and seen in ED and sent home w/ diagnosis of Bronchitis.  He requested that I pick up his meds from [facility] which I did. I picked up: Prednisone  20mg  tab 2 daily x 5 days, Myralax, Suppositories and an Albuterol  inhaler. Pt  w/ audible wheezing.  He has a productive cough with thick white secretions.  Reviewed list of approved medicines he can take.  He had taken 1 dose of Theraflu and I advised him this was on the list of meds to AVOID.  He confirmed that he would not take anymore.  I recommended Mucinex  or plain Robitussin which is an approved list that could be beneficial in help break up and express the thick secretions. Discussed with pt the importance of taking the Prednisone  which is a steroid which will help w/ inflamation of his airways and encouraged him to use his inhaler prn. I did have him demonstrate how he used his inhaler to make sure he was doing it correctly.    [name], EMT-Paramedic [phone] 04/18/2024

## 2024-04-20 ENCOUNTER — Other Ambulatory Visit (HOSPITAL_COMMUNITY): Payer: Self-pay

## 2024-04-20 ENCOUNTER — Other Ambulatory Visit: Payer: Self-pay

## 2024-04-23 ENCOUNTER — Other Ambulatory Visit: Payer: Self-pay

## 2024-04-26 ENCOUNTER — Other Ambulatory Visit (HOSPITAL_COMMUNITY): Payer: Self-pay | Admitting: Emergency Medicine

## 2024-05-04 ENCOUNTER — Other Ambulatory Visit: Payer: Self-pay

## 2024-05-04 ENCOUNTER — Other Ambulatory Visit: Payer: Self-pay | Admitting: Family Medicine

## 2024-05-04 DIAGNOSIS — R062 Wheezing: Secondary | ICD-10-CM

## 2024-05-06 ENCOUNTER — Other Ambulatory Visit (HOSPITAL_COMMUNITY): Payer: Self-pay | Admitting: Internal Medicine

## 2024-05-07 ENCOUNTER — Other Ambulatory Visit: Payer: Self-pay

## 2024-05-07 ENCOUNTER — Other Ambulatory Visit: Payer: Self-pay | Admitting: Family Medicine

## 2024-05-07 MED ORDER — SACUBITRIL-VALSARTAN 97-103 MG PO TABS
1.0000 | ORAL_TABLET | Freq: Two times a day (BID) | ORAL | 3 refills | Status: AC
  Filled 2024-05-07: qty 60, 30d supply, fill #0
  Filled 2024-06-04 – 2024-06-16 (×3): qty 60, 30d supply, fill #1

## 2024-05-08 ENCOUNTER — Other Ambulatory Visit (HOSPITAL_COMMUNITY): Payer: Self-pay

## 2024-05-08 ENCOUNTER — Other Ambulatory Visit: Payer: Self-pay

## 2024-05-08 NOTE — Progress Notes (Signed)
 Paramedicine Encounter    Patient ID: Thomas West, male    DOB: Sep 28, 1960, 63 y.o.   MRN: [id number]   Complaints NONE  Assessment A&O x 4, skin W&D w/ good color. Denies chest pain or SOB.  Lung sounds clear and equal bilat.  No peripheral edema noted.  BP elevated-pt had not yet taken his morning meds when vitals were obtained.  Pt denies headache, visual disturbance.  Compliance with meds YES  Pill box filled x 2 weeks  Refills needed NONE  Meds changes since last visit NONE    Social changes NONE   BP (!) 180/100 (BP Location: Left Arm, Patient Position: Sitting, Cuff Size: Normal)   Pulse 80   Resp 16   Wt 169 lb 6.4 oz (76.8 kg)   SpO2 98%   BMI 25.02 kg/m  Weight yesterday- Last visit weight-170lb  ACTION: Home visit completed  [name] [phone] 05/08/2024  Patient Care Team: Jordan, [name], MD as PCP - General (Family Medicine) Bensimhon, Toribio SAUNDERS, MD as PCP - Cardiology (Cardiology) Tobie Tonita POUR, DO as Consulting Physician (Neurology) Lionell Jon DEL, [facility] (Pharmacist) Lionell Jon DEL, [facility] (Pharmacist)  Patient Active Problem List   Diagnosis Date Noted   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   Pruritic rash 05/25/2022   Polyneuropathy associated with underlying disease 01/15/2022   Atherosclerosis of aorta 01/15/2022   History of CVA (cerebrovascular accident) without residual deficits 12/24/2021   AKI (acute kidney injury) 12/13/2021   Diarrhea 12/13/2021   Shortness of breath 12/13/2021   Chest pain 12/13/2021   Nausea and vomiting 12/13/2021   Heart failure (HCC) 12/08/2021   Hypertension associated with diabetes (HCC) 07/12/2021   Chronic kidney disease, stage 3a (HCC) 07/12/2021   Hyperkalemia 07/12/2021   CHF exacerbation (HCC) 04/20/2021   HFrEF (heart failure with reduced ejection fraction) (HCC) 02/03/2021   Diabetes mellitus (HCC) 03/27/2019   Hyperlipidemia associated with type 2 diabetes mellitus (HCC)  03/27/2019   CAD (coronary artery disease) 03/05/2019   History of MI (myocardial infarction) 03/05/2019   Type 2 diabetes mellitus with diabetic neuropathy, unspecified (HCC) 03/05/2019   HLD (hyperlipidemia) 03/05/2019   GERD (gastroesophageal reflux disease) 03/05/2019   Hypertension, essential, benign 03/05/2019   Current Medications[1] Allergies[2]   Social History   Socioeconomic History   Marital status: Single    Spouse name: Not on file   Number of children: Not on file   Years of education: Not on file   Highest education level: Not on file  Occupational History   Not on file  Tobacco Use   Smoking status: Former    Current packs/day: 0.00    Types: Cigarettes    Quit date: 10/06/2017    Years since quitting: 6.5   Smokeless tobacco: Never  Substance and Sexual Activity   Alcohol use: Yes    Comment: Occasional Beer   Drug use: Yes    Types: Marijuana   Sexual activity: Not on file  Other Topics Concern   Not on file  Social History Narrative   Right Handed.    Lives in a two story home    Lives with 37 year old son.    Social Drivers of Health   Tobacco Use: Medium Risk (04/14/2024)   Patient History    Smoking Tobacco Use: Former    Smokeless Tobacco Use: Never    Passive Exposure: Not on file  Financial Resource Strain: Low Risk (01/18/2022)   Overall Financial Resource Strain (CARDIA)  Difficulty of Paying Living Expenses: Not hard at all  Food Insecurity: No Food Insecurity (09/21/2022)   Hunger Vital Sign    Worried About Running Out of Food in the Last Year: Never true    Ran Out of Food in the Last Year: Never true  Transportation Needs: No Transportation Needs (06/04/2022)   PRAPARE - Administrator, Civil Service (Medical): No    Lack of Transportation (Non-Medical): No  Physical Activity: Inactive (01/18/2022)   Exercise Vital Sign    Days of Exercise per Week: 0 days    Minutes of Exercise per Session: 0 min  Stress: Stress  Concern Present (01/18/2022)   [employer] of Occupational Health - Occupational Stress Questionnaire    Feeling of Stress : To some extent  Social Connections: Socially Isolated (01/18/2022)   Social Connection and Isolation Panel    Frequency of Communication with Friends and Family: More than three times a week    Frequency of Social Gatherings with Friends and Family: More than three times a week    Attends Religious Services: Never    Database Administrator or Organizations: No    Attends Banker Meetings: Never    Marital Status: Never married  Intimate Partner Violence: Not At Risk (01/18/2022)   Humiliation, Afraid, Rape, and Kick questionnaire    Fear of Current or Ex-Partner: No    Emotionally Abused: No    Physically Abused: No    Sexually Abused: No  Depression (PHQ2-9): Low Risk (08/18/2023)   Depression (PHQ2-9)    PHQ-2 Score: 0  Alcohol Screen: Low Risk (01/18/2022)   Alcohol Screen    Last Alcohol Screening Score (AUDIT): 0  Housing: Low Risk (09/21/2022)   Housing    Last Housing Risk Score: 0  Utilities: Not on file  Health Literacy: Not on file    Physical Exam      No future appointments.        [1]  Current Outpatient Medications:    albuterol  (VENTOLIN  HFA) 108 (90 Base) MCG/ACT inhaler, Inhale 1-2 puffs into the lungs every 6 (six) hours for 1 week, then as needed., Disp: 6.7 g, Rfl: 0   amLODipine  (NORVASC ) 5 MG tablet, Take 0.5 tablets (2.5 mg total) by mouth daily., Disp: 30 tablet, Rfl: 3   aspirin  EC 81 MG tablet, Take 1 tablet (81 mg total) by mouth daily. Swallow whole., Disp: 30 tablet, Rfl: 11   atorvastatin  (LIPITOR ) 80 MG tablet, Take 1 tablet (80 mg total) by mouth daily., Disp: 90 tablet, Rfl: 3   bisacodyl  (DULCOLAX) 10 MG suppository, Place 1 suppository (10 mg total) rectally as needed for moderate constipation., Disp: 12 suppository, Rfl: 0   carvedilol  (COREG ) 12.5 MG tablet, Take 1 tablet (12.5 mg total) by mouth  2 (two) times daily., Disp: 180 tablet, Rfl: 3   clopidogrel  (PLAVIX ) 75 MG tablet, Take 1 tablet (75 mg total) by mouth daily., Disp: 90 tablet, Rfl: 2   cyanocobalamin  (VITAMIN B12) 1000 MCG tablet, Take 1 tablet (1,000 mcg total) by mouth daily., Disp: 130 tablet, Rfl: 3   DULoxetine  (CYMBALTA ) 30 MG capsule, Take 1 capsule (30 mg total) by mouth daily., Disp: 90 capsule, Rfl: 2   empagliflozin  (JARDIANCE ) 25 MG TABS tablet, Take 1 tablet (25 mg total) by mouth daily before breakfast., Disp: 100 tablet, Rfl: 2   ezetimibe  (ZETIA ) 10 MG tablet, Take 1 tablet (10 mg total) by mouth daily., Disp: 90 tablet, Rfl: 0  hydrALAZINE  (APRESOLINE ) 50 MG tablet, Take 2 tablets (100 mg total) by mouth 3 (three) times daily., Disp: 180 tablet, Rfl: 11   hydrocortisone  cream 1 %, Apply 1 Application topically 2 (two) times daily as needed for itching., Disp: 45 g, Rfl: 1   isosorbide  mononitrate (IMDUR ) 60 MG 24 hr tablet, Take 1.5 tablets (90 mg total) by mouth daily., Disp: 180 tablet, Rfl: 3   polyethylene glycol (MIRALAX ) 17 g packet, Take 1 packet (17 g) by mouth daily., Disp: 30 each, Rfl: 1   spironolactone  (ALDACTONE ) 25 MG tablet, Take 0.5 tablets (12.5 mg total) by mouth daily., Disp: 45 tablet, Rfl: 3   Tafamidis  (VYNDAMAX ) 61 MG CAPS, Take 1 capsule by mouth daily., Disp: 30 capsule, Rfl: 11   torsemide  (DEMADEX ) 20 MG tablet, Take 1 tablet (20 mg total) by mouth daily., Disp: 30 tablet, Rfl: 6   linagliptin  (TRADJENTA ) 5 MG TABS tablet, Take 1 tablet (5 mg total) by mouth daily. Due for follow up, Disp: 30 tablet, Rfl: 0   sacubitril -valsartan  (ENTRESTO ) 97-103 MG, Take 1 tablet by mouth 2 (two) times daily., Disp: 60 tablet, Rfl: 3   Semaglutide  (RYBELSUS ) 3 MG TABS, Take 1 tablet (3 mg total) by mouth daily. (Patient not taking: Reported on 04/26/2024), Disp: 30 tablet, Rfl: 4 [2]  Allergies Allergen Reactions   Bee Venom Anaphylaxis, Swelling and Other (See Comments)    Swells all over

## 2024-05-09 ENCOUNTER — Other Ambulatory Visit (HOSPITAL_COMMUNITY): Payer: Self-pay | Admitting: Emergency Medicine

## 2024-05-09 NOTE — Progress Notes (Signed)
 Paramedicine Encounter    Patient ID: Thomas West, male    DOB: 24-Oct-1960, 63 y.o.   MRN: [id number]   Complaints NONE  Assessment A&O x 4, skin W&D w/ good color.  Denies chest pain or SOB.  Lung sound clear and equal bilat. No peripheral edema noted.  Compliance with meds YES  Pill box filled x 2 weeks  Refills needed NONE  Meds changes since last visit NONE    Social changes NONE   BP (!) 122/90 (BP Location: Left Arm, Patient Position: Sitting, Cuff Size: Normal)   Pulse 98   Wt 171 lb 3.2 oz (77.7 kg)   SpO2 96%   BMI 25.28 kg/m  Weight yesterday- Last visit weight-169.4lb   ACTION: Home visit completed  [name] [phone] 05/09/2024  Patient Care Team: Jordan, [name], MD as PCP - General (Family Medicine) Bensimhon, Toribio SAUNDERS, MD as PCP - Cardiology (Cardiology) Tobie Tonita POUR, DO as Consulting Physician (Neurology) Lionell Jon DEL, [facility] (Pharmacist) Lionell Jon DEL, [facility] (Pharmacist)  Patient Active Problem List   Diagnosis Date Noted   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   Pruritic rash 05/25/2022   Polyneuropathy associated with underlying disease 01/15/2022   Atherosclerosis of aorta 01/15/2022   History of CVA (cerebrovascular accident) without residual deficits 12/24/2021   AKI (acute kidney injury) 12/13/2021   Diarrhea 12/13/2021   Shortness of breath 12/13/2021   Chest pain 12/13/2021   Nausea and vomiting 12/13/2021   Heart failure (HCC) 12/08/2021   Hypertension associated with diabetes (HCC) 07/12/2021   Chronic kidney disease, stage 3a (HCC) 07/12/2021   Hyperkalemia 07/12/2021   CHF exacerbation (HCC) 04/20/2021   HFrEF (heart failure with reduced ejection fraction) (HCC) 02/03/2021   Diabetes mellitus (HCC) 03/27/2019   Hyperlipidemia associated with type 2 diabetes mellitus (HCC) 03/27/2019   CAD (coronary artery disease) 03/05/2019   History of MI (myocardial infarction) 03/05/2019   Type 2 diabetes  mellitus with diabetic neuropathy, unspecified (HCC) 03/05/2019   HLD (hyperlipidemia) 03/05/2019   GERD (gastroesophageal reflux disease) 03/05/2019   Hypertension, essential, benign 03/05/2019   Current Medications[1] Allergies[2]   Social History   Socioeconomic History   Marital status: Single    Spouse name: Not on file   Number of children: Not on file   Years of education: Not on file   Highest education level: Not on file  Occupational History   Not on file  Tobacco Use   Smoking status: Former    Current packs/day: 0.00    Types: Cigarettes    Quit date: 10/06/2017    Years since quitting: 6.5   Smokeless tobacco: Never  Substance and Sexual Activity   Alcohol use: Yes    Comment: Occasional Beer   Drug use: Yes    Types: Marijuana   Sexual activity: Not on file  Other Topics Concern   Not on file  Social History Narrative   Right Handed.    Lives in a two story home    Lives with 72 year old son.    Social Drivers of Health   Tobacco Use: Medium Risk (04/14/2024)   Patient History    Smoking Tobacco Use: Former    Smokeless Tobacco Use: Never    Passive Exposure: Not on file  Financial Resource Strain: Low Risk (01/18/2022)   Overall Financial Resource Strain (CARDIA)    Difficulty of Paying Living Expenses: Not hard at all  Food Insecurity: No Food Insecurity (09/21/2022)   Hunger Vital Sign  Worried About Programme Researcher, Broadcasting/film/video in the Last Year: Never true    Ran Out of Food in the Last Year: Never true  Transportation Needs: No Transportation Needs (06/04/2022)   PRAPARE - Administrator, Civil Service (Medical): No    Lack of Transportation (Non-Medical): No  Physical Activity: Inactive (01/18/2022)   Exercise Vital Sign    Days of Exercise per Week: 0 days    Minutes of Exercise per Session: 0 min  Stress: Stress Concern Present (01/18/2022)   [employer] of Occupational [employer] - Occupational Stress Questionnaire    Feeling of  Stress : To some extent  Social Connections: Socially Isolated (01/18/2022)   Social Connection and Isolation Panel    Frequency of Communication with Friends and Family: More than three times a week    Frequency of Social Gatherings with Friends and Family: More than three times a week    Attends Religious Services: Never    Database Administrator or Organizations: No    Attends Banker Meetings: Never    Marital Status: Never married  Intimate Partner Violence: Not At Risk (01/18/2022)   Humiliation, Afraid, Rape, and Kick questionnaire    Fear of Current or Ex-Partner: No    Emotionally Abused: No    Physically Abused: No    Sexually Abused: No  Depression (PHQ2-9): Low Risk (08/18/2023)   Depression (PHQ2-9)    PHQ-2 Score: 0  Alcohol Screen: Low Risk (01/18/2022)   Alcohol Screen    Last Alcohol Screening Score (AUDIT): 0  Housing: Low Risk (09/21/2022)   Housing    Last Housing Risk Score: 0  Utilities: Not on file  Health Literacy: Not on file    Physical Exam      No future appointments.         [1]  Current Outpatient Medications:    albuterol  (VENTOLIN  HFA) 108 (90 Base) MCG/ACT inhaler, Inhale 1-2 puffs into the lungs every 6 (six) hours for 1 week, then as needed., Disp: 6.7 g, Rfl: 0   amLODipine  (NORVASC ) 5 MG tablet, Take 0.5 tablets (2.5 mg total) by mouth daily., Disp: 30 tablet, Rfl: 3   aspirin  EC 81 MG tablet, Take 1 tablet (81 mg total) by mouth daily. Swallow whole., Disp: 30 tablet, Rfl: 11   atorvastatin  (LIPITOR ) 80 MG tablet, Take 1 tablet (80 mg total) by mouth daily., Disp: 90 tablet, Rfl: 3   bisacodyl  (DULCOLAX) 10 MG suppository, Place 1 suppository (10 mg total) rectally as needed for moderate constipation., Disp: 12 suppository, Rfl: 0   carvedilol  (COREG ) 12.5 MG tablet, Take 1 tablet (12.5 mg total) by mouth 2 (two) times daily., Disp: 180 tablet, Rfl: 3   clopidogrel  (PLAVIX ) 75 MG tablet, Take 1 tablet (75 mg total) by  mouth daily., Disp: 90 tablet, Rfl: 2   cyanocobalamin  (VITAMIN B12) 1000 MCG tablet, Take 1 tablet (1,000 mcg total) by mouth daily., Disp: 130 tablet, Rfl: 3   DULoxetine  (CYMBALTA ) 30 MG capsule, Take 1 capsule (30 mg total) by mouth daily., Disp: 90 capsule, Rfl: 2   empagliflozin  (JARDIANCE ) 25 MG TABS tablet, Take 1 tablet (25 mg total) by mouth daily before breakfast., Disp: 100 tablet, Rfl: 2   ezetimibe  (ZETIA ) 10 MG tablet, Take 1 tablet (10 mg total) by mouth daily., Disp: 90 tablet, Rfl: 0   hydrALAZINE  (APRESOLINE ) 50 MG tablet, Take 2 tablets (100 mg total) by mouth 3 (three) times daily., Disp: 180 tablet, Rfl: 11  hydrocortisone  cream 1 %, Apply 1 Application topically 2 (two) times daily as needed for itching., Disp: 45 g, Rfl: 1   isosorbide  mononitrate (IMDUR ) 60 MG 24 hr tablet, Take 1.5 tablets (90 mg total) by mouth daily., Disp: 180 tablet, Rfl: 3   linagliptin  (TRADJENTA ) 5 MG TABS tablet, Take 1 tablet (5 mg total) by mouth daily. Due for follow up, Disp: 30 tablet, Rfl: 0   polyethylene glycol (MIRALAX ) 17 g packet, Take 1 packet (17 g) by mouth daily. (Patient taking differently: Take 17 g by mouth daily as needed. As needed), Disp: 30 each, Rfl: 1   sacubitril -valsartan  (ENTRESTO ) 97-103 MG, Take 1 tablet by mouth 2 (two) times daily., Disp: 60 tablet, Rfl: 3   spironolactone  (ALDACTONE ) 25 MG tablet, Take 0.5 tablets (12.5 mg total) by mouth daily., Disp: 45 tablet, Rfl: 3   Tafamidis  (VYNDAMAX ) 61 MG CAPS, Take 1 capsule by mouth daily., Disp: 30 capsule, Rfl: 11   torsemide  (DEMADEX ) 20 MG tablet, Take 1 tablet (20 mg total) by mouth daily., Disp: 30 tablet, Rfl: 6   Semaglutide  (RYBELSUS ) 3 MG TABS, Take 1 tablet (3 mg total) by mouth daily. (Patient not taking: Reported on 05/09/2024), Disp: 30 tablet, Rfl: 4 [2]  Allergies Allergen Reactions   Bee Venom Anaphylaxis, Swelling and Other (See Comments)    Swells all over

## 2024-05-11 ENCOUNTER — Other Ambulatory Visit: Payer: Self-pay

## 2024-05-16 ENCOUNTER — Other Ambulatory Visit (HOSPITAL_COMMUNITY): Payer: Self-pay

## 2024-05-22 ENCOUNTER — Other Ambulatory Visit (HOSPITAL_COMMUNITY): Payer: Self-pay

## 2024-05-22 ENCOUNTER — Other Ambulatory Visit: Payer: Self-pay

## 2024-05-22 NOTE — Progress Notes (Signed)
 Specialty Pharmacy Refill Coordination Note  Thomas West is a 63 y.o. male contacted today regarding refills of specialty medication(s) Tafamidis  (Vyndamax )   Patient requested Delivery   Delivery date: 05/25/24   Verified address: [address] [place] [address]  [place] [address]   Medication will be filled on: 05/23/24

## 2024-05-23 ENCOUNTER — Other Ambulatory Visit (HOSPITAL_COMMUNITY): Payer: Self-pay | Admitting: Emergency Medicine

## 2024-05-23 ENCOUNTER — Other Ambulatory Visit: Payer: Self-pay | Admitting: Family Medicine

## 2024-05-23 ENCOUNTER — Other Ambulatory Visit (HOSPITAL_COMMUNITY): Payer: Self-pay

## 2024-05-23 ENCOUNTER — Other Ambulatory Visit: Payer: Self-pay

## 2024-05-23 NOTE — Progress Notes (Signed)
 Paramedicine Encounter    Patient ID: Thomas West, male    DOB: [birth year], 63 y.o.   MRN: [id number]   Complaints NONE  Assessment A&O x 4, skin W&D w/ good color.  Denies chest pain or SOB.  Lung sounds clear and equal bilat.  No peripheral edema noted.  Compliance with meds YES  Pill box filled x 2 weeks  Refills needed Torsemide , Hydralazine , Tradjenta  Same called into pharmacy  Meds changes since last visit NONE    Social changes NONE   BP 110/60 (BP Location: Left Arm, Patient Position: Sitting, Cuff Size: Normal)   Pulse 62   Resp 16   Wt 173 lb (78.5 kg)   SpO2 96%   BMI 25.55 kg/m  Weight yesterday- Last visit weight-171.2lb  ACTION: Home visit completed  [name] [phone] 05/27/2024  Patient Care Team: Jordan, [name], MD as PCP - General (Family Medicine) Bensimhon, Toribio SAUNDERS, MD as PCP - Cardiology (Cardiology) Tobie Tonita POUR, DO as Consulting Physician (Neurology) Lionell Jon DEL, [facility] (Pharmacist) Lionell Jon DEL, [facility] (Pharmacist)  Patient Active Problem List   Diagnosis Date Noted   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   Pruritic rash 05/25/2022   Polyneuropathy associated with underlying disease 01/15/2022   Atherosclerosis of aorta 01/15/2022   History of CVA (cerebrovascular accident) without residual deficits 12/24/2021   AKI (acute kidney injury) 12/13/2021   Diarrhea 12/13/2021   Shortness of breath 12/13/2021   Chest pain 12/13/2021   Nausea and vomiting 12/13/2021   Heart failure (HCC) 12/08/2021   Hypertension associated with diabetes (HCC) 07/12/2021   Chronic kidney disease, stage 3a (HCC) 07/12/2021   Hyperkalemia 07/12/2021   CHF exacerbation (HCC) 04/20/2021   HFrEF (heart failure with reduced ejection fraction) (HCC) 02/03/2021   Diabetes mellitus (HCC) 03/27/2019   Hyperlipidemia associated with type 2 diabetes mellitus (HCC) 03/27/2019   CAD (coronary artery disease) 03/05/2019   History of MI  (myocardial infarction) 03/05/2019   Type 2 diabetes mellitus with diabetic neuropathy, unspecified (HCC) 03/05/2019   HLD (hyperlipidemia) 03/05/2019   GERD (gastroesophageal reflux disease) 03/05/2019   Hypertension, essential, benign 03/05/2019   Current Medications[1] Allergies[2]   Social History   Socioeconomic History   Marital status: Single    Spouse name: Not on file   Number of children: Not on file   Years of education: Not on file   Highest education level: Not on file  Occupational History   Not on file  Tobacco Use   Smoking status: Former    Current packs/day: 0.00    Types: Cigarettes    Quit date: 10/06/2017    Years since quitting: 6.6   Smokeless tobacco: Never  Substance and Sexual Activity   Alcohol use: Yes    Comment: Occasional Beer   Drug use: Yes    Types: Marijuana   Sexual activity: Not on file  Other Topics Concern   Not on file  Social History Narrative   Right Handed.    Lives in a two story home    Lives with 29 year old son.    Social Drivers of Health   Tobacco Use: Medium Risk (04/14/2024)   Patient History    Smoking Tobacco Use: Former    Smokeless Tobacco Use: Never    Passive Exposure: Not on file  Financial Resource Strain: Low Risk (01/18/2022)   Overall Financial Resource Strain (CARDIA)    Difficulty of Paying Living Expenses: Not hard at all  Food Insecurity: No Food Insecurity (  09/21/2022)   Hunger Vital Sign    Worried About Running Out of Food in the Last Year: Never true    Ran Out of Food in the Last Year: Never true  Transportation Needs: No Transportation Needs (06/04/2022)   PRAPARE - Administrator, Civil Service (Medical): No    Lack of Transportation (Non-Medical): No  Physical Activity: Inactive (01/18/2022)   Exercise Vital Sign    Days of Exercise per Week: 0 days    Minutes of Exercise per Session: 0 min  Stress: Stress Concern Present (01/18/2022)   [employer]  - Occupational Stress Questionnaire    Feeling of Stress : To some extent  Social Connections: Socially Isolated (01/18/2022)   Social Connection and Isolation Panel    Frequency of Communication with Friends and Family: More than three times a week    Frequency of Social Gatherings with Friends and Family: More than three times a week    Attends Religious Services: Never    Database Administrator or Organizations: No    Attends Banker Meetings: Never    Marital Status: Never married  Intimate Partner Violence: Not At Risk (01/18/2022)   Humiliation, Afraid, Rape, and Kick questionnaire    Fear of Current or Ex-Partner: No    Emotionally Abused: No    Physically Abused: No    Sexually Abused: No  Depression (PHQ2-9): Low Risk (08/18/2023)   Depression (PHQ2-9)    PHQ-2 Score: 0  Alcohol Screen: Low Risk (01/18/2022)   Alcohol Screen    Last Alcohol Screening Score (AUDIT): 0  Housing: Low Risk (09/21/2022)   Housing    Last Housing Risk Score: 0  Utilities: Not on file  Health Literacy: Not on file    Physical Exam      No future appointments.     Refills called in: Torsemide  Hydralazine  Tradjenta - needs refill from dr.      [1]  Current Outpatient Medications:    albuterol  (VENTOLIN  HFA) 108 (90 Base) MCG/ACT inhaler, Inhale 1-2 puffs into the lungs every 6 (six) hours for 1 week, then as needed., Disp: 6.7 g, Rfl: 0   amLODipine  (NORVASC ) 5 MG tablet, Take 0.5 tablets (2.5 mg total) by mouth daily., Disp: 30 tablet, Rfl: 3   atorvastatin  (LIPITOR ) 80 MG tablet, Take 1 tablet (80 mg total) by mouth daily., Disp: 90 tablet, Rfl: 3   bisacodyl  (DULCOLAX) 10 MG suppository, Place 1 suppository (10 mg total) rectally as needed for moderate constipation., Disp: 12 suppository, Rfl: 0   carvedilol  (COREG ) 12.5 MG tablet, Take 1 tablet (12.5 mg total) by mouth 2 (two) times daily., Disp: 180 tablet, Rfl: 3   clopidogrel  (PLAVIX ) 75 MG tablet, Take 1 tablet (75  mg total) by mouth daily., Disp: 90 tablet, Rfl: 2   cyanocobalamin  (VITAMIN B12) 1000 MCG tablet, Take 1 tablet (1,000 mcg total) by mouth daily., Disp: 130 tablet, Rfl: 3   DULoxetine  (CYMBALTA ) 30 MG capsule, Take 1 capsule (30 mg total) by mouth daily., Disp: 90 capsule, Rfl: 2   empagliflozin  (JARDIANCE ) 25 MG TABS tablet, Take 1 tablet (25 mg total) by mouth daily before breakfast., Disp: 100 tablet, Rfl: 2   ezetimibe  (ZETIA ) 10 MG tablet, Take 1 tablet (10 mg total) by mouth daily., Disp: 90 tablet, Rfl: 0   hydrALAZINE  (APRESOLINE ) 50 MG tablet, Take 2 tablets (100 mg total) by mouth 3 (three) times daily., Disp: 180 tablet, Rfl: 11   hydrocortisone   cream 1 %, Apply 1 Application topically 2 (two) times daily as needed for itching., Disp: 45 g, Rfl: 1   isosorbide  mononitrate (IMDUR ) 60 MG 24 hr tablet, Take 1.5 tablets (90 mg total) by mouth daily., Disp: 180 tablet, Rfl: 3   linagliptin  (TRADJENTA ) 5 MG TABS tablet, Take 1 tablet (5 mg total) by mouth daily. Due for follow up, Disp: 30 tablet, Rfl: 0   polyethylene glycol (MIRALAX ) 17 g packet, Take 1 packet (17 g) by mouth daily., Disp: 30 each, Rfl: 1   sacubitril -valsartan  (ENTRESTO ) 97-103 MG, Take 1 tablet by mouth 2 (two) times daily., Disp: 60 tablet, Rfl: 3   spironolactone  (ALDACTONE ) 25 MG tablet, Take 0.5 tablets (12.5 mg total) by mouth daily., Disp: 45 tablet, Rfl: 3   Tafamidis  (VYNDAMAX ) 61 MG CAPS, Take 1 capsule by mouth daily., Disp: 30 capsule, Rfl: 11   torsemide  (DEMADEX ) 20 MG tablet, Take 1 tablet (20 mg total) by mouth daily., Disp: 30 tablet, Rfl: 6   aspirin  EC 81 MG tablet, Take 1 tablet (81 mg total) by mouth daily. Swallow whole., Disp: 30 tablet, Rfl: 11   Semaglutide  (RYBELSUS ) 3 MG TABS, Take 1 tablet (3 mg total) by mouth daily. (Patient not taking: Reported on 05/23/2024), Disp: 30 tablet, Rfl: 4 [2]  Allergies Allergen Reactions   Bee Venom Anaphylaxis, Swelling and Other (See Comments)    Swells all  over

## 2024-05-27 ENCOUNTER — Other Ambulatory Visit (HOSPITAL_COMMUNITY): Payer: Self-pay

## 2024-05-30 ENCOUNTER — Other Ambulatory Visit (HOSPITAL_COMMUNITY): Payer: Self-pay | Admitting: Emergency Medicine

## 2024-05-30 NOTE — Progress Notes (Signed)
 On my arrival, pt advises he has another appointment that will conflict w/ today's visit.  He had received refills on his Hydralazine  and Vyndamax  via mail.  Added the Hydralazine  to his pill box where he was missing this med.  Will see him tomorrow to complete med rec and complete visit.    [name], EMT-Paramedic [phone] 05/30/2024

## 2024-06-03 ENCOUNTER — Other Ambulatory Visit (HOSPITAL_COMMUNITY): Payer: Self-pay

## 2024-06-04 ENCOUNTER — Other Ambulatory Visit: Payer: Self-pay

## 2024-06-05 ENCOUNTER — Other Ambulatory Visit (HOSPITAL_COMMUNITY): Payer: Self-pay

## 2024-06-05 ENCOUNTER — Other Ambulatory Visit: Payer: Self-pay

## 2024-06-06 ENCOUNTER — Other Ambulatory Visit (HOSPITAL_COMMUNITY): Payer: Self-pay | Admitting: Emergency Medicine

## 2024-06-06 NOTE — Progress Notes (Signed)
 Paramedicine Encounter    Patient ID: Thomas West, male    DOB: 11/08/1960, 64 y.o.   MRN: [id number]   Complaints NONE  Assessment A&O x 4, skin W&D w/ good color. Denies chest pain or SOB.  Lung sounds clear and equal bilat.  No peripheral edema noted.  BP on the low side of normal and pt denies no dizziness or weakness.  Compliance with medsYES  Pill box filled x 2 weeks  Refills needed B12  Meds changes since last visit NONE    Social changes NONE   BP 102/70 (BP Location: Left Arm, Patient Position: Sitting, Cuff Size: Normal)   Pulse 84   Resp 16   Wt 172 lb 3.2 oz (78.1 kg)   SpO2 97%   BMI 25.43 kg/m  Weight yesterday- Last visit weight-173lb  ACTION: Home visit completed  [name] [phone] 06/06/2024  Patient Care Team: Jordan, [name], MD as PCP - General (Family Medicine) Bensimhon, Toribio SAUNDERS, MD as PCP - Cardiology (Cardiology) Tobie Tonita POUR, DO as Consulting Physician (Neurology) Lionell Jon DEL, [facility] (Pharmacist) Lionell Jon DEL, [facility] (Pharmacist)  Patient Active Problem List   Diagnosis Date Noted   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   Pruritic rash 05/25/2022   Polyneuropathy associated with underlying disease 01/15/2022   Atherosclerosis of aorta 01/15/2022   History of CVA (cerebrovascular accident) without residual deficits 12/24/2021   AKI (acute kidney injury) 12/13/2021   Diarrhea 12/13/2021   Shortness of breath 12/13/2021   Chest pain 12/13/2021   Nausea and vomiting 12/13/2021   Heart failure (HCC) 12/08/2021   Hypertension associated with diabetes (HCC) 07/12/2021   Chronic kidney disease, stage 3a (HCC) 07/12/2021   Hyperkalemia 07/12/2021   CHF exacerbation (HCC) 04/20/2021   HFrEF (heart failure with reduced ejection fraction) (HCC) 02/03/2021   Diabetes mellitus (HCC) 03/27/2019   Hyperlipidemia associated with type 2 diabetes mellitus (HCC) 03/27/2019   CAD (coronary artery disease) 03/05/2019    History of MI (myocardial infarction) 03/05/2019   Type 2 diabetes mellitus with diabetic neuropathy, unspecified (HCC) 03/05/2019   HLD (hyperlipidemia) 03/05/2019   GERD (gastroesophageal reflux disease) 03/05/2019   Hypertension, essential, benign 03/05/2019   Current Medications[1] Allergies[2]   Social History   Socioeconomic History   Marital status: Single    Spouse name: Not on file   Number of children: Not on file   Years of education: Not on file   Highest education level: Not on file  Occupational History   Not on file  Tobacco Use   Smoking status: Former    Current packs/day: 0.00    Types: Cigarettes    Quit date: 10/06/2017    Years since quitting: 6.6   Smokeless tobacco: Never  Substance and Sexual Activity   Alcohol use: Yes    Comment: Occasional Beer   Drug use: Yes    Types: Marijuana   Sexual activity: Not on file  Other Topics Concern   Not on file  Social History Narrative   Right Handed.    Lives in a two story home    Lives with 42 year old son.    Social Drivers of Health   Tobacco Use: Medium Risk (04/14/2024)   Patient History    Smoking Tobacco Use: Former    Smokeless Tobacco Use: Never    Passive Exposure: Not on file  Financial Resource Strain: Low Risk (01/18/2022)   Overall Financial Resource Strain (CARDIA)    Difficulty of Paying Living Expenses: Not  hard at all  Food Insecurity: No Food Insecurity (09/21/2022)   Hunger Vital Sign    Worried About Running Out of Food in the Last Year: Never true    Ran Out of Food in the Last Year: Never true  Transportation Needs: No Transportation Needs (06/04/2022)   PRAPARE - Administrator, Civil Service (Medical): No    Lack of Transportation (Non-Medical): No  Physical Activity: Inactive (01/18/2022)   Exercise Vital Sign    Days of Exercise per Week: 0 days    Minutes of Exercise per Session: 0 min  Stress: Stress Concern Present (01/18/2022)   [employer] of  Occupational Health - Occupational Stress Questionnaire    Feeling of Stress : To some extent  Social Connections: Socially Isolated (01/18/2022)   Social Connection and Isolation Panel    Frequency of Communication with Friends and Family: More than three times a week    Frequency of Social Gatherings with Friends and Family: More than three times a week    Attends Religious Services: Never    Database Administrator or Organizations: No    Attends Banker Meetings: Never    Marital Status: Never married  Intimate Partner Violence: Not At Risk (01/18/2022)   Humiliation, Afraid, Rape, and Kick questionnaire    Fear of Current or Ex-Partner: No    Emotionally Abused: No    Physically Abused: No    Sexually Abused: No  Depression (PHQ2-9): Low Risk (08/18/2023)   Depression (PHQ2-9)    PHQ-2 Score: 0  Alcohol Screen: Low Risk (01/18/2022)   Alcohol Screen    Last Alcohol Screening Score (AUDIT): 0  Housing: Low Risk (09/21/2022)   Housing    Last Housing Risk Score: 0  Utilities: Not on file  Health Literacy: Not on file    Physical Exam      Future Appointments  Date Time Provider Department Center  07/13/2024 11:20 AM Bensimhon, Toribio SAUNDERS, MD [facility] None           [1]  Current Outpatient Medications:    albuterol  (VENTOLIN  HFA) 108 (90 Base) MCG/ACT inhaler, Inhale 1-2 puffs into the lungs every 6 (six) hours for 1 week, then as needed., Disp: 6.7 g, Rfl: 0   amLODipine  (NORVASC ) 5 MG tablet, Take 0.5 tablets (2.5 mg total) by mouth daily., Disp: 30 tablet, Rfl: 3   aspirin  EC 81 MG tablet, Take 1 tablet (81 mg total) by mouth daily. Swallow whole., Disp: 30 tablet, Rfl: 11   atorvastatin  (LIPITOR ) 80 MG tablet, Take 1 tablet (80 mg total) by mouth daily., Disp: 90 tablet, Rfl: 3   bisacodyl  (DULCOLAX) 10 MG suppository, Place 1 suppository (10 mg total) rectally as needed for moderate constipation., Disp: 12 suppository, Rfl: 0   carvedilol  (COREG ) 12.5 MG  tablet, Take 1 tablet (12.5 mg total) by mouth 2 (two) times daily., Disp: 180 tablet, Rfl: 3   clopidogrel  (PLAVIX ) 75 MG tablet, Take 1 tablet (75 mg total) by mouth daily., Disp: 90 tablet, Rfl: 2   cyanocobalamin  (VITAMIN B12) 1000 MCG tablet, Take 1 tablet (1,000 mcg total) by mouth daily., Disp: 130 tablet, Rfl: 3   DULoxetine  (CYMBALTA ) 30 MG capsule, Take 1 capsule (30 mg total) by mouth daily., Disp: 90 capsule, Rfl: 2   empagliflozin  (JARDIANCE ) 25 MG TABS tablet, Take 1 tablet (25 mg total) by mouth daily before breakfast., Disp: 100 tablet, Rfl: 2   ezetimibe  (ZETIA ) 10 MG tablet, Take 1 tablet (  10 mg total) by mouth daily., Disp: 90 tablet, Rfl: 0   hydrALAZINE  (APRESOLINE ) 50 MG tablet, Take 2 tablets (100 mg total) by mouth 3 (three) times daily., Disp: 180 tablet, Rfl: 11   hydrocortisone  cream 1 %, Apply 1 Application topically 2 (two) times daily as needed for itching., Disp: 45 g, Rfl: 1   isosorbide  mononitrate (IMDUR ) 60 MG 24 hr tablet, Take 1.5 tablets (90 mg total) by mouth daily., Disp: 180 tablet, Rfl: 3   linagliptin  (TRADJENTA ) 5 MG TABS tablet, Take 1 tablet (5 mg total) by mouth daily. Due for follow up, Disp: 30 tablet, Rfl: 0   sacubitril -valsartan  (ENTRESTO ) 97-103 MG, Take 1 tablet by mouth 2 (two) times daily., Disp: 60 tablet, Rfl: 3   spironolactone  (ALDACTONE ) 25 MG tablet, Take 0.5 tablets (12.5 mg total) by mouth daily., Disp: 45 tablet, Rfl: 3   Tafamidis  (VYNDAMAX ) 61 MG CAPS, Take 1 capsule by mouth daily., Disp: 30 capsule, Rfl: 11   torsemide  (DEMADEX ) 20 MG tablet, Take 1 tablet (20 mg total) by mouth daily., Disp: 30 tablet, Rfl: 6   polyethylene glycol (MIRALAX ) 17 g packet, Take 1 packet (17 g) by mouth daily. (Patient not taking: Reported on 06/06/2024), Disp: 30 each, Rfl: 1   Semaglutide  (RYBELSUS ) 3 MG TABS, Take 1 tablet (3 mg total) by mouth daily. (Patient not taking: Reported on 05/23/2024), Disp: 30 tablet, Rfl: 4 [2]  Allergies Allergen  Reactions   Bee Venom Anaphylaxis, Swelling and Other (See Comments)    Swells all over

## 2024-06-08 ENCOUNTER — Other Ambulatory Visit: Payer: Self-pay

## 2024-06-11 ENCOUNTER — Other Ambulatory Visit: Payer: Self-pay

## 2024-06-12 ENCOUNTER — Other Ambulatory Visit (HOSPITAL_COMMUNITY): Payer: Self-pay

## 2024-06-12 ENCOUNTER — Other Ambulatory Visit: Payer: Self-pay

## 2024-06-14 ENCOUNTER — Other Ambulatory Visit: Payer: Self-pay | Admitting: Pharmacy Technician

## 2024-06-14 ENCOUNTER — Telehealth (HOSPITAL_COMMUNITY): Payer: Self-pay

## 2024-06-14 ENCOUNTER — Other Ambulatory Visit (HOSPITAL_COMMUNITY): Payer: Self-pay

## 2024-06-14 ENCOUNTER — Other Ambulatory Visit: Payer: Self-pay

## 2024-06-14 NOTE — Telephone Encounter (Signed)
 Advanced Heart Failure Patient Advocate Encounter  The patient was approved for a [employer] grant that will help cover the cost of Carvedilol , Entresto , Jardiance , Spironolactone , Vyndmax.  Total amount awarded, $7,500.  Effective: 05/16/2024 - 05/15/2025.  BIN [id number] PCN [id number] [id number] ID [id number]  Approval and processing information added to GAILA Rachel DEL, CPhT Rx Patient Advocate Phone: [phone]

## 2024-06-15 ENCOUNTER — Other Ambulatory Visit: Payer: Self-pay

## 2024-06-15 ENCOUNTER — Other Ambulatory Visit (HOSPITAL_COMMUNITY): Payer: Self-pay

## 2024-06-22 ENCOUNTER — Other Ambulatory Visit: Payer: Self-pay

## 2024-06-22 NOTE — Progress Notes (Signed)
 Specialty Pharmacy Refill Coordination Note  Thomas West is a 64 y.o. male contacted today regarding refills of specialty medication(s) Tafamidis  (Vyndamax )   Patient requested Delivery   Delivery date: 06/27/24   Verified address: [address] [place] [address]  [place] [address]   Medication will be filled on: 06/26/24    06/22/2024 Spoke with his nurse, Mary

## 2024-06-26 ENCOUNTER — Other Ambulatory Visit: Payer: Self-pay

## 2024-06-26 ENCOUNTER — Other Ambulatory Visit: Payer: Self-pay | Admitting: Family Medicine

## 2024-07-13 ENCOUNTER — Ambulatory Visit (HOSPITAL_COMMUNITY): Admitting: Internal Medicine
